# Patient Record
Sex: Male | Born: 1935 | Race: White | Hispanic: No | Smoking: Former smoker
Health system: Southern US, Community
[De-identification: ages and names within clinical notes are randomized; demographics above are authoritative.]

## PROBLEM LIST (undated history)

## (undated) DIAGNOSIS — C61 Malignant neoplasm of prostate: Secondary | ICD-10-CM

## (undated) DIAGNOSIS — N186 End stage renal disease: Secondary | ICD-10-CM

## (undated) DIAGNOSIS — C4490 Unspecified malignant neoplasm of skin, unspecified: Secondary | ICD-10-CM

## (undated) DIAGNOSIS — I272 Pulmonary hypertension, unspecified: Secondary | ICD-10-CM

## (undated) DIAGNOSIS — N2581 Secondary hyperparathyroidism of renal origin: Secondary | ICD-10-CM

## (undated) DIAGNOSIS — I428 Other cardiomyopathies: Secondary | ICD-10-CM

## (undated) DIAGNOSIS — J189 Pneumonia, unspecified organism: Secondary | ICD-10-CM

## (undated) DIAGNOSIS — R0600 Dyspnea, unspecified: Secondary | ICD-10-CM

## (undated) DIAGNOSIS — D126 Benign neoplasm of colon, unspecified: Secondary | ICD-10-CM

## (undated) DIAGNOSIS — I10 Essential (primary) hypertension: Secondary | ICD-10-CM

## (undated) DIAGNOSIS — I38 Endocarditis, valve unspecified: Secondary | ICD-10-CM

## (undated) DIAGNOSIS — C189 Malignant neoplasm of colon, unspecified: Secondary | ICD-10-CM

## (undated) DIAGNOSIS — M1990 Unspecified osteoarthritis, unspecified site: Secondary | ICD-10-CM

## (undated) DIAGNOSIS — Z9289 Personal history of other medical treatment: Secondary | ICD-10-CM

## (undated) DIAGNOSIS — Z950 Presence of cardiac pacemaker: Secondary | ICD-10-CM

## (undated) DIAGNOSIS — I2510 Atherosclerotic heart disease of native coronary artery without angina pectoris: Secondary | ICD-10-CM

## (undated) DIAGNOSIS — R42 Dizziness and giddiness: Secondary | ICD-10-CM

## (undated) DIAGNOSIS — I5022 Chronic systolic (congestive) heart failure: Secondary | ICD-10-CM

## (undated) DIAGNOSIS — I482 Chronic atrial fibrillation, unspecified: Secondary | ICD-10-CM

## (undated) DIAGNOSIS — G4730 Sleep apnea, unspecified: Secondary | ICD-10-CM

## (undated) DIAGNOSIS — E785 Hyperlipidemia, unspecified: Secondary | ICD-10-CM

## (undated) HISTORY — PX: PTCA: SHX146

## (undated) HISTORY — DX: Other cardiomyopathies: I42.8

## (undated) HISTORY — PX: PROSTATECTOMY: SHX69

## (undated) HISTORY — PX: FRACTURE SURGERY: SHX138

## (undated) HISTORY — DX: Sleep apnea, unspecified: G47.30

## (undated) HISTORY — PX: JOINT REPLACEMENT: SHX530

## (undated) HISTORY — DX: Hyperlipidemia, unspecified: E78.5

## (undated) HISTORY — PX: PENILE PROSTHESIS PLACEMENT: SHX739

## (undated) HISTORY — PX: COLECTOMY: SHX59

## (undated) HISTORY — DX: Unspecified osteoarthritis, unspecified site: M19.90

## (undated) HISTORY — DX: Essential (primary) hypertension: I10

## (undated) HISTORY — PX: EYE SURGERY: SHX253

## (undated) HISTORY — PX: INSERT / REPLACE / REMOVE PACEMAKER: SUR710

## (undated) HISTORY — PX: OTHER SURGICAL HISTORY: SHX169

## (undated) HISTORY — DX: Atherosclerotic heart disease of native coronary artery without angina pectoris: I25.10

## (undated) HISTORY — DX: Secondary hyperparathyroidism of renal origin: N25.81

## (undated) HISTORY — DX: Benign neoplasm of colon, unspecified: D12.6

---

## 2001-02-19 ENCOUNTER — Encounter: Payer: Self-pay | Admitting: Cardiology

## 2001-02-19 ENCOUNTER — Ambulatory Visit (HOSPITAL_COMMUNITY): Admission: RE | Admit: 2001-02-19 | Discharge: 2001-02-19 | Payer: Self-pay | Admitting: Cardiology

## 2002-07-09 ENCOUNTER — Encounter: Payer: Self-pay | Admitting: Emergency Medicine

## 2002-07-09 ENCOUNTER — Emergency Department (HOSPITAL_COMMUNITY): Admission: EM | Admit: 2002-07-09 | Discharge: 2002-07-10 | Payer: Self-pay | Admitting: Emergency Medicine

## 2002-11-14 ENCOUNTER — Emergency Department (HOSPITAL_COMMUNITY): Admission: EM | Admit: 2002-11-14 | Discharge: 2002-11-14 | Payer: Self-pay | Admitting: Emergency Medicine

## 2003-10-03 ENCOUNTER — Encounter (HOSPITAL_BASED_OUTPATIENT_CLINIC_OR_DEPARTMENT_OTHER): Admission: RE | Admit: 2003-10-03 | Discharge: 2003-10-10 | Payer: Self-pay | Admitting: Internal Medicine

## 2004-01-02 ENCOUNTER — Encounter (HOSPITAL_BASED_OUTPATIENT_CLINIC_OR_DEPARTMENT_OTHER): Admission: RE | Admit: 2004-01-02 | Discharge: 2004-01-16 | Payer: Self-pay | Admitting: Internal Medicine

## 2004-02-10 ENCOUNTER — Ambulatory Visit (HOSPITAL_BASED_OUTPATIENT_CLINIC_OR_DEPARTMENT_OTHER): Admission: RE | Admit: 2004-02-10 | Discharge: 2004-02-10 | Payer: Self-pay | Admitting: Internal Medicine

## 2004-03-10 ENCOUNTER — Emergency Department (HOSPITAL_COMMUNITY): Admission: EM | Admit: 2004-03-10 | Discharge: 2004-03-10 | Payer: Self-pay | Admitting: Emergency Medicine

## 2004-04-10 ENCOUNTER — Encounter (HOSPITAL_BASED_OUTPATIENT_CLINIC_OR_DEPARTMENT_OTHER): Admission: RE | Admit: 2004-04-10 | Discharge: 2004-04-26 | Payer: Self-pay | Admitting: Internal Medicine

## 2004-07-16 ENCOUNTER — Encounter (HOSPITAL_BASED_OUTPATIENT_CLINIC_OR_DEPARTMENT_OTHER): Admission: RE | Admit: 2004-07-16 | Discharge: 2004-07-29 | Payer: Self-pay | Admitting: Internal Medicine

## 2004-07-24 ENCOUNTER — Ambulatory Visit: Payer: Self-pay | Admitting: Internal Medicine

## 2004-08-22 ENCOUNTER — Ambulatory Visit: Payer: Self-pay | Admitting: Internal Medicine

## 2004-09-24 ENCOUNTER — Ambulatory Visit: Payer: Self-pay | Admitting: Internal Medicine

## 2004-09-26 ENCOUNTER — Ambulatory Visit: Payer: Self-pay | Admitting: Internal Medicine

## 2004-10-01 ENCOUNTER — Ambulatory Visit: Payer: Self-pay | Admitting: Pulmonary Disease

## 2004-10-22 ENCOUNTER — Encounter (HOSPITAL_BASED_OUTPATIENT_CLINIC_OR_DEPARTMENT_OTHER): Admission: RE | Admit: 2004-10-22 | Discharge: 2004-11-06 | Payer: Self-pay | Admitting: Internal Medicine

## 2004-10-28 ENCOUNTER — Ambulatory Visit: Payer: Self-pay | Admitting: Pulmonary Disease

## 2004-11-04 ENCOUNTER — Ambulatory Visit: Payer: Self-pay | Admitting: Internal Medicine

## 2004-11-11 ENCOUNTER — Ambulatory Visit: Payer: Self-pay | Admitting: Internal Medicine

## 2004-12-02 ENCOUNTER — Ambulatory Visit: Payer: Self-pay | Admitting: Internal Medicine

## 2004-12-31 ENCOUNTER — Ambulatory Visit: Payer: Self-pay | Admitting: Internal Medicine

## 2005-01-08 ENCOUNTER — Ambulatory Visit: Payer: Self-pay | Admitting: Pulmonary Disease

## 2005-01-30 ENCOUNTER — Ambulatory Visit: Payer: Self-pay | Admitting: Internal Medicine

## 2005-03-04 ENCOUNTER — Ambulatory Visit: Payer: Self-pay | Admitting: Internal Medicine

## 2005-03-06 ENCOUNTER — Emergency Department (HOSPITAL_COMMUNITY): Admission: EM | Admit: 2005-03-06 | Discharge: 2005-03-06 | Payer: Self-pay | Admitting: Emergency Medicine

## 2005-03-11 ENCOUNTER — Ambulatory Visit: Payer: Self-pay | Admitting: Internal Medicine

## 2005-03-19 ENCOUNTER — Ambulatory Visit: Payer: Self-pay

## 2005-04-08 ENCOUNTER — Ambulatory Visit: Payer: Self-pay | Admitting: Pulmonary Disease

## 2005-04-08 ENCOUNTER — Ambulatory Visit: Payer: Self-pay | Admitting: Internal Medicine

## 2005-05-09 ENCOUNTER — Ambulatory Visit: Payer: Self-pay | Admitting: Internal Medicine

## 2005-05-12 ENCOUNTER — Ambulatory Visit: Payer: Self-pay | Admitting: Internal Medicine

## 2005-05-13 ENCOUNTER — Ambulatory Visit: Payer: Self-pay | Admitting: Internal Medicine

## 2005-05-14 ENCOUNTER — Encounter: Admission: RE | Admit: 2005-05-14 | Discharge: 2005-05-14 | Payer: Self-pay | Admitting: Internal Medicine

## 2005-05-14 ENCOUNTER — Ambulatory Visit: Payer: Self-pay | Admitting: Internal Medicine

## 2005-05-16 ENCOUNTER — Ambulatory Visit: Payer: Self-pay | Admitting: Internal Medicine

## 2005-05-22 ENCOUNTER — Ambulatory Visit: Payer: Self-pay

## 2005-05-23 ENCOUNTER — Encounter: Admission: RE | Admit: 2005-05-23 | Discharge: 2005-05-23 | Payer: Self-pay | Admitting: Internal Medicine

## 2005-06-05 ENCOUNTER — Ambulatory Visit: Payer: Self-pay | Admitting: Internal Medicine

## 2005-06-10 ENCOUNTER — Ambulatory Visit: Payer: Self-pay | Admitting: Pulmonary Disease

## 2005-07-09 ENCOUNTER — Ambulatory Visit: Payer: Self-pay | Admitting: Internal Medicine

## 2005-07-10 ENCOUNTER — Ambulatory Visit: Payer: Self-pay | Admitting: Gastroenterology

## 2005-07-11 ENCOUNTER — Ambulatory Visit: Payer: Self-pay | Admitting: Internal Medicine

## 2005-07-21 ENCOUNTER — Encounter (INDEPENDENT_AMBULATORY_CARE_PROVIDER_SITE_OTHER): Payer: Self-pay | Admitting: *Deleted

## 2005-07-21 ENCOUNTER — Ambulatory Visit: Payer: Self-pay | Admitting: Gastroenterology

## 2005-07-21 LAB — HM COLONOSCOPY

## 2005-08-07 ENCOUNTER — Ambulatory Visit: Payer: Self-pay | Admitting: Internal Medicine

## 2005-08-26 ENCOUNTER — Ambulatory Visit: Payer: Self-pay | Admitting: Internal Medicine

## 2005-09-08 ENCOUNTER — Ambulatory Visit: Payer: Self-pay | Admitting: Internal Medicine

## 2005-09-25 ENCOUNTER — Encounter: Admission: RE | Admit: 2005-09-25 | Discharge: 2005-09-25 | Payer: Self-pay | Admitting: Internal Medicine

## 2005-10-13 ENCOUNTER — Ambulatory Visit: Payer: Self-pay | Admitting: Internal Medicine

## 2005-11-07 ENCOUNTER — Ambulatory Visit: Payer: Self-pay | Admitting: Internal Medicine

## 2005-11-12 ENCOUNTER — Ambulatory Visit: Payer: Self-pay | Admitting: Internal Medicine

## 2005-12-05 ENCOUNTER — Ambulatory Visit: Payer: Self-pay | Admitting: Internal Medicine

## 2006-01-06 ENCOUNTER — Ambulatory Visit: Payer: Self-pay | Admitting: Internal Medicine

## 2006-02-10 ENCOUNTER — Ambulatory Visit: Payer: Self-pay | Admitting: Internal Medicine

## 2006-03-12 ENCOUNTER — Ambulatory Visit: Payer: Self-pay | Admitting: Internal Medicine

## 2006-03-24 ENCOUNTER — Ambulatory Visit: Payer: Self-pay | Admitting: Internal Medicine

## 2006-03-26 ENCOUNTER — Ambulatory Visit: Payer: Self-pay | Admitting: Internal Medicine

## 2006-04-02 ENCOUNTER — Ambulatory Visit: Payer: Self-pay | Admitting: Internal Medicine

## 2006-04-09 ENCOUNTER — Encounter: Payer: Self-pay | Admitting: Internal Medicine

## 2006-04-09 ENCOUNTER — Ambulatory Visit: Payer: Self-pay

## 2006-04-13 ENCOUNTER — Ambulatory Visit: Payer: Self-pay | Admitting: Internal Medicine

## 2006-05-13 ENCOUNTER — Ambulatory Visit: Payer: Self-pay | Admitting: Internal Medicine

## 2006-05-21 ENCOUNTER — Ambulatory Visit: Payer: Self-pay | Admitting: Internal Medicine

## 2006-06-19 ENCOUNTER — Ambulatory Visit: Payer: Self-pay | Admitting: Internal Medicine

## 2006-07-06 ENCOUNTER — Ambulatory Visit: Payer: Self-pay | Admitting: Internal Medicine

## 2006-07-06 LAB — CONVERTED CEMR LAB
ALT: 22 units/L (ref 0–40)
AST: 22 units/L (ref 0–37)
Chol/HDL Ratio, serum: 5.6
Cholesterol: 204 mg/dL (ref 0–200)
Creatinine, Ser: 2 mg/dL — ABNORMAL HIGH (ref 0.4–1.5)
HDL: 36.3 mg/dL — ABNORMAL LOW (ref >39.0)
Hgb A1c MFr Bld: 7.9 % — ABNORMAL HIGH (ref 4.6–6.0)
LDL DIRECT: 123.1 mg/dL
Potassium: 4.6 meq/L (ref 3.5–5.1)
Sodium: 144 meq/L (ref 135–145)
Triglyceride fasting, serum: 169 mg/dL — ABNORMAL HIGH (ref 0–149)
VLDL: 34 mg/dL (ref 0–40)

## 2006-07-13 ENCOUNTER — Ambulatory Visit: Payer: Self-pay | Admitting: Internal Medicine

## 2006-07-13 DIAGNOSIS — E785 Hyperlipidemia, unspecified: Secondary | ICD-10-CM | POA: Insufficient documentation

## 2006-07-13 DIAGNOSIS — Z8546 Personal history of malignant neoplasm of prostate: Secondary | ICD-10-CM | POA: Insufficient documentation

## 2006-07-13 DIAGNOSIS — I2510 Atherosclerotic heart disease of native coronary artery without angina pectoris: Secondary | ICD-10-CM

## 2006-07-13 DIAGNOSIS — I10 Essential (primary) hypertension: Secondary | ICD-10-CM | POA: Insufficient documentation

## 2006-07-13 DIAGNOSIS — Z85038 Personal history of other malignant neoplasm of large intestine: Secondary | ICD-10-CM | POA: Insufficient documentation

## 2006-07-13 DIAGNOSIS — E1165 Type 2 diabetes mellitus with hyperglycemia: Secondary | ICD-10-CM

## 2006-07-13 DIAGNOSIS — I5022 Chronic systolic (congestive) heart failure: Secondary | ICD-10-CM

## 2006-07-13 DIAGNOSIS — I482 Chronic atrial fibrillation, unspecified: Secondary | ICD-10-CM

## 2006-08-06 ENCOUNTER — Ambulatory Visit: Payer: Self-pay | Admitting: Internal Medicine

## 2006-08-17 ENCOUNTER — Ambulatory Visit: Payer: Self-pay | Admitting: Internal Medicine

## 2006-08-17 LAB — CONVERTED CEMR LAB
ALT: 21 units/L (ref 0–40)
AST: 19 units/L (ref 0–37)
Albumin: 3.8 g/dL (ref 3.5–5.2)
Alkaline Phosphatase: 45 units/L (ref 39–117)
Bilirubin, Direct: 0.1 mg/dL (ref 0.0–0.3)
Chol/HDL Ratio, serum: 3.6
Cholesterol: 149 mg/dL (ref 0–200)
HDL: 41.4 mg/dL (ref >39.0)
LDL Cholesterol: 80 mg/dL (ref 0–99)
Total Bilirubin: 0.8 mg/dL (ref 0.3–1.2)
Total Protein: 6.7 g/dL (ref 6.0–8.3)
Triglyceride fasting, serum: 136 mg/dL (ref 0–149)
VLDL: 27 mg/dL (ref 0–40)

## 2006-08-24 ENCOUNTER — Ambulatory Visit: Payer: Self-pay | Admitting: Internal Medicine

## 2006-08-24 ENCOUNTER — Encounter: Payer: Self-pay | Admitting: Internal Medicine

## 2006-08-24 LAB — CONVERTED CEMR LAB
Cholesterol, target level: 200 mg/dL
HDL goal, serum: 40 mg/dL
LDL Goal: 70 mg/dL

## 2006-09-07 ENCOUNTER — Ambulatory Visit: Payer: Self-pay | Admitting: Internal Medicine

## 2006-09-22 HISTORY — PX: TOTAL KNEE ARTHROPLASTY: SHX125

## 2006-10-29 ENCOUNTER — Ambulatory Visit: Payer: Self-pay | Admitting: Internal Medicine

## 2006-11-27 ENCOUNTER — Ambulatory Visit: Payer: Self-pay | Admitting: Internal Medicine

## 2006-12-28 ENCOUNTER — Ambulatory Visit: Payer: Self-pay | Admitting: Internal Medicine

## 2006-12-28 LAB — CONVERTED CEMR LAB
ALT: 22 units/L (ref 0–40)
AST: 24 units/L (ref 0–37)
Albumin: 3.6 g/dL (ref 3.5–5.2)
Alkaline Phosphatase: 50 units/L (ref 39–117)
BUN: 33 mg/dL — ABNORMAL HIGH (ref 6–23)
Bilirubin, Direct: 0.1 mg/dL (ref 0.0–0.3)
CO2: 26 meq/L (ref 19–32)
Calcium: 9.5 mg/dL (ref 8.4–10.5)
Chloride: 106 meq/L (ref 96–112)
Cholesterol: 128 mg/dL (ref 0–200)
Creatinine, Ser: 1.9 mg/dL — ABNORMAL HIGH (ref 0.4–1.5)
GFR calc Af Amer: 45 mL/min
GFR calc non Af Amer: 37 mL/min
Glucose, Bld: 295 mg/dL — ABNORMAL HIGH (ref 70–99)
HDL: 40.8 mg/dL (ref >39.0)
Hgb A1c MFr Bld: 8.7 % — ABNORMAL HIGH (ref 4.6–6.0)
LDL Cholesterol: 61 mg/dL (ref 0–99)
Potassium: 3.6 meq/L (ref 3.5–5.1)
Sodium: 141 meq/L (ref 135–145)
Total Bilirubin: 0.7 mg/dL (ref 0.3–1.2)
Total CHOL/HDL Ratio: 3.1
Total Protein: 6.6 g/dL (ref 6.0–8.3)
Triglycerides: 131 mg/dL (ref 0–149)
VLDL: 26 mg/dL (ref 0–40)

## 2007-01-04 ENCOUNTER — Encounter: Payer: Self-pay | Admitting: Internal Medicine

## 2007-01-04 ENCOUNTER — Ambulatory Visit: Payer: Self-pay | Admitting: Internal Medicine

## 2007-01-28 ENCOUNTER — Ambulatory Visit: Payer: Self-pay | Admitting: Internal Medicine

## 2007-02-26 ENCOUNTER — Ambulatory Visit: Payer: Self-pay | Admitting: Internal Medicine

## 2007-03-25 ENCOUNTER — Ambulatory Visit: Payer: Self-pay | Admitting: Internal Medicine

## 2007-04-08 ENCOUNTER — Ambulatory Visit: Payer: Self-pay | Admitting: Internal Medicine

## 2007-04-20 ENCOUNTER — Telehealth: Payer: Self-pay | Admitting: Internal Medicine

## 2007-04-26 ENCOUNTER — Ambulatory Visit: Payer: Self-pay | Admitting: Internal Medicine

## 2007-04-26 LAB — CONVERTED CEMR LAB
ALT: 22 units/L (ref 0–53)
AST: 21 units/L (ref 0–37)
Albumin: 3.8 g/dL (ref 3.5–5.2)
Alkaline Phosphatase: 42 units/L (ref 39–117)
BUN: 30 mg/dL — ABNORMAL HIGH (ref 6–23)
Bilirubin, Direct: 0.1 mg/dL (ref 0.0–0.3)
CO2: 28 meq/L (ref 19–32)
Calcium: 8.7 mg/dL (ref 8.4–10.5)
Chloride: 109 meq/L (ref 96–112)
Cholesterol: 129 mg/dL (ref 0–200)
Creatinine, Ser: 1.9 mg/dL — ABNORMAL HIGH (ref 0.4–1.5)
GFR calc Af Amer: 45 mL/min
GFR calc non Af Amer: 37 mL/min
Glucose, Bld: 181 mg/dL — ABNORMAL HIGH (ref 70–99)
HDL: 42.9 mg/dL (ref >39.0)
Hgb A1c MFr Bld: 8.1 % — ABNORMAL HIGH (ref 4.6–6.0)
LDL Cholesterol: 72 mg/dL (ref 0–99)
Potassium: 3.7 meq/L (ref 3.5–5.1)
Sodium: 145 meq/L (ref 135–145)
Total Bilirubin: 0.8 mg/dL (ref 0.3–1.2)
Total CHOL/HDL Ratio: 3
Total Protein: 6.5 g/dL (ref 6.0–8.3)
Triglycerides: 73 mg/dL (ref 0–149)
VLDL: 15 mg/dL (ref 0–40)

## 2007-05-05 ENCOUNTER — Ambulatory Visit: Payer: Self-pay | Admitting: Internal Medicine

## 2007-05-19 ENCOUNTER — Telehealth: Payer: Self-pay | Admitting: Internal Medicine

## 2007-05-27 ENCOUNTER — Ambulatory Visit: Payer: Self-pay | Admitting: Internal Medicine

## 2007-05-27 LAB — CONVERTED CEMR LAB
INR: 2.1
Prothrombin Time: 17.7 s

## 2007-06-21 ENCOUNTER — Ambulatory Visit: Payer: Self-pay | Admitting: Pulmonary Disease

## 2007-06-30 ENCOUNTER — Ambulatory Visit: Payer: Self-pay | Admitting: Internal Medicine

## 2007-06-30 LAB — CONVERTED CEMR LAB
INR: 3.3
Prothrombin Time: 21.8 s

## 2007-07-19 ENCOUNTER — Telehealth: Payer: Self-pay | Admitting: Internal Medicine

## 2007-07-28 ENCOUNTER — Ambulatory Visit: Payer: Self-pay | Admitting: Internal Medicine

## 2007-07-28 LAB — CONVERTED CEMR LAB
INR: 2.2
Prothrombin Time: 18 s

## 2007-08-16 ENCOUNTER — Telehealth (INDEPENDENT_AMBULATORY_CARE_PROVIDER_SITE_OTHER): Payer: Self-pay | Admitting: *Deleted

## 2007-08-25 ENCOUNTER — Ambulatory Visit: Payer: Self-pay | Admitting: Internal Medicine

## 2007-08-25 LAB — CONVERTED CEMR LAB
ALT: 28 units/L (ref 0–53)
AST: 24 units/L (ref 0–37)
Albumin: 3.8 g/dL (ref 3.5–5.2)
Alkaline Phosphatase: 45 units/L (ref 39–117)
BUN: 31 mg/dL — ABNORMAL HIGH (ref 6–23)
Bilirubin Urine: NEGATIVE
Bilirubin, Direct: 0.3 mg/dL (ref 0.0–0.3)
CO2: 27 meq/L (ref 19–32)
Calcium: 9.3 mg/dL (ref 8.4–10.5)
Chloride: 103 meq/L (ref 96–112)
Cholesterol: 147 mg/dL (ref 0–200)
Creatinine, Ser: 2 mg/dL — ABNORMAL HIGH (ref 0.4–1.5)
GFR calc Af Amer: 42 mL/min
GFR calc non Af Amer: 35 mL/min
Glucose, Bld: 162 mg/dL — ABNORMAL HIGH (ref 70–99)
Glucose, Urine, Semiquant: 100
HDL: 42.9 mg/dL (ref >39.0)
Hgb A1c MFr Bld: 7.2 % — ABNORMAL HIGH (ref 4.6–6.0)
INR: 5
Ketones, urine, test strip: NEGATIVE
LDL Cholesterol: 79 mg/dL (ref 0–99)
Nitrite: NEGATIVE
Potassium: 4.2 meq/L (ref 3.5–5.1)
Prothrombin Time: 27.3 s
Sodium: 142 meq/L (ref 135–145)
Specific Gravity, Urine: 1.02
Total Bilirubin: 0.9 mg/dL (ref 0.3–1.2)
Total CHOL/HDL Ratio: 3.4
Total Protein: 6.3 g/dL (ref 6.0–8.3)
Triglycerides: 128 mg/dL (ref 0–149)
Urobilinogen, UA: 0.2
VLDL: 26 mg/dL (ref 0–40)
WBC Urine, dipstick: NEGATIVE
pH: 5.5

## 2007-09-01 ENCOUNTER — Ambulatory Visit: Payer: Self-pay | Admitting: Internal Medicine

## 2007-09-01 LAB — CONVERTED CEMR LAB
INR: 1.8
Prothrombin Time: 16.5 s

## 2007-09-14 ENCOUNTER — Ambulatory Visit: Payer: Self-pay | Admitting: Internal Medicine

## 2007-09-14 LAB — CONVERTED CEMR LAB
INR: 3.2
Prothrombin Time: 21.5 s

## 2007-12-27 ENCOUNTER — Telehealth: Payer: Self-pay | Admitting: Internal Medicine

## 2007-12-28 ENCOUNTER — Ambulatory Visit: Payer: Self-pay | Admitting: Internal Medicine

## 2007-12-28 LAB — CONVERTED CEMR LAB
INR: 2.1
Prothrombin Time: 17.9 s

## 2008-01-18 ENCOUNTER — Ambulatory Visit: Payer: Self-pay | Admitting: Internal Medicine

## 2008-01-18 LAB — CONVERTED CEMR LAB
ALT: 27 units/L (ref 0–53)
AST: 24 units/L (ref 0–37)
Albumin: 3.8 g/dL (ref 3.5–5.2)
Alkaline Phosphatase: 46 units/L (ref 39–117)
BUN: 29 mg/dL — ABNORMAL HIGH (ref 6–23)
Bilirubin, Direct: 0.2 mg/dL (ref 0.0–0.3)
Calcium: 9 mg/dL (ref 8.4–10.5)
Cholesterol: 137 mg/dL (ref 0–200)
GFR calc Af Amer: 45 mL/min
Hgb A1c MFr Bld: 7.5 % — ABNORMAL HIGH (ref 4.6–6.0)
Total Bilirubin: 1.2 mg/dL (ref 0.3–1.2)
Total Protein: 6.7 g/dL (ref 6.0–8.3)

## 2008-01-27 ENCOUNTER — Ambulatory Visit: Payer: Self-pay | Admitting: Internal Medicine

## 2008-02-24 ENCOUNTER — Ambulatory Visit: Payer: Self-pay | Admitting: Internal Medicine

## 2008-02-24 LAB — CONVERTED CEMR LAB: Prothrombin Time: 19.7 s

## 2008-03-23 ENCOUNTER — Ambulatory Visit: Payer: Self-pay | Admitting: Internal Medicine

## 2008-03-23 LAB — CONVERTED CEMR LAB: INR: 2.7

## 2008-04-10 ENCOUNTER — Ambulatory Visit: Payer: Self-pay | Admitting: Internal Medicine

## 2008-04-11 ENCOUNTER — Inpatient Hospital Stay (HOSPITAL_COMMUNITY): Admission: EM | Admit: 2008-04-11 | Discharge: 2008-04-14 | Payer: Self-pay | Admitting: Emergency Medicine

## 2008-04-12 ENCOUNTER — Encounter: Payer: Self-pay | Admitting: Cardiology

## 2008-04-18 ENCOUNTER — Ambulatory Visit: Payer: Self-pay | Admitting: Cardiology

## 2008-04-18 LAB — CONVERTED CEMR LAB: Chloride: 107 meq/L (ref 96–112)

## 2008-04-25 ENCOUNTER — Ambulatory Visit: Payer: Self-pay | Admitting: Cardiology

## 2008-04-25 ENCOUNTER — Inpatient Hospital Stay (HOSPITAL_COMMUNITY): Admission: AD | Admit: 2008-04-25 | Discharge: 2008-05-03 | Payer: Self-pay | Admitting: Cardiology

## 2008-05-08 ENCOUNTER — Telehealth: Payer: Self-pay | Admitting: Internal Medicine

## 2008-05-09 ENCOUNTER — Ambulatory Visit: Payer: Self-pay | Admitting: Internal Medicine

## 2008-05-09 LAB — CONVERTED CEMR LAB

## 2008-05-12 LAB — CONVERTED CEMR LAB
BUN: 31 mg/dL — ABNORMAL HIGH (ref 6–23)
CO2: 25 meq/L (ref 19–32)
Calcium: 8.9 mg/dL (ref 8.4–10.5)
GFR calc Af Amer: 42 mL/min
Glucose, Bld: 155 mg/dL — ABNORMAL HIGH (ref 70–99)
Hgb A1c MFr Bld: 7.2 % — ABNORMAL HIGH (ref 4.6–6.0)

## 2008-05-17 ENCOUNTER — Ambulatory Visit: Payer: Self-pay | Admitting: Cardiology

## 2008-05-19 ENCOUNTER — Ambulatory Visit: Payer: Self-pay | Admitting: Cardiology

## 2008-05-19 ENCOUNTER — Ambulatory Visit: Payer: Self-pay | Admitting: Internal Medicine

## 2008-05-23 ENCOUNTER — Ambulatory Visit: Payer: Self-pay | Admitting: Cardiology

## 2008-05-24 ENCOUNTER — Ambulatory Visit: Payer: Self-pay | Admitting: Cardiology

## 2008-05-24 LAB — CONVERTED CEMR LAB
BUN: 29 mg/dL — ABNORMAL HIGH (ref 6–23)
CO2: 29 meq/L (ref 19–32)
Chloride: 108 meq/L (ref 96–112)
Chloride: 111 meq/L (ref 96–112)
Glucose, Bld: 250 mg/dL — ABNORMAL HIGH (ref 70–99)
Glucose, Bld: 121 mg/dL — ABNORMAL HIGH (ref 70–99)
Potassium: 2.6 meq/L — CL (ref 3.5–5.1)
Potassium: 3.2 meq/L — ABNORMAL LOW (ref 3.5–5.1)
Sodium: 141 meq/L (ref 135–145)
Sodium: 143 meq/L (ref 135–145)

## 2008-05-25 ENCOUNTER — Encounter (HOSPITAL_COMMUNITY): Admission: RE | Admit: 2008-05-25 | Discharge: 2008-08-23 | Payer: Self-pay | Admitting: Cardiology

## 2008-05-30 ENCOUNTER — Ambulatory Visit: Payer: Self-pay | Admitting: Internal Medicine

## 2008-05-30 LAB — CONVERTED CEMR LAB
BUN: 30 mg/dL — ABNORMAL HIGH (ref 6–23)
CO2: 28 meq/L (ref 19–32)
Chloride: 111 meq/L (ref 96–112)
Creatinine, Ser: 2 mg/dL — ABNORMAL HIGH (ref 0.4–1.5)
Potassium: 4 meq/L (ref 3.5–5.1)

## 2008-06-13 ENCOUNTER — Ambulatory Visit: Payer: Self-pay | Admitting: Internal Medicine

## 2008-06-13 LAB — CONVERTED CEMR LAB: Prothrombin Time: 18.7 s

## 2008-06-14 LAB — CONVERTED CEMR LAB
BUN: 29 mg/dL — ABNORMAL HIGH (ref 6–23)
Chloride: 111 meq/L (ref 96–112)
GFR calc Af Amer: 42 mL/min
GFR calc non Af Amer: 35 mL/min
Glucose, Bld: 205 mg/dL — ABNORMAL HIGH (ref 70–99)
Potassium: 4 meq/L (ref 3.5–5.1)
Sodium: 143 meq/L (ref 135–145)

## 2008-06-19 ENCOUNTER — Ambulatory Visit: Payer: Self-pay | Admitting: Internal Medicine

## 2008-06-19 LAB — CONVERTED CEMR LAB
CO2: 28 meq/L (ref 19–32)
Calcium: 9.2 mg/dL (ref 8.4–10.5)
Creatinine, Ser: 2.2 mg/dL — ABNORMAL HIGH (ref 0.4–1.5)
GFR calc non Af Amer: 31 mL/min
Pro B Natriuretic peptide (BNP): 941 pg/mL — ABNORMAL HIGH (ref 0.0–100.0)
Sodium: 143 meq/L (ref 135–145)

## 2008-06-29 ENCOUNTER — Ambulatory Visit: Payer: Self-pay | Admitting: Cardiology

## 2008-07-04 ENCOUNTER — Ambulatory Visit: Payer: Self-pay | Admitting: Internal Medicine

## 2008-07-11 ENCOUNTER — Ambulatory Visit: Payer: Self-pay | Admitting: Internal Medicine

## 2008-07-11 LAB — CONVERTED CEMR LAB: Prothrombin Time: 19 s

## 2008-07-31 ENCOUNTER — Ambulatory Visit: Payer: Self-pay | Admitting: Gastroenterology

## 2008-08-04 ENCOUNTER — Telehealth: Payer: Self-pay | Admitting: Gastroenterology

## 2008-08-08 ENCOUNTER — Ambulatory Visit: Payer: Self-pay | Admitting: Internal Medicine

## 2008-08-10 LAB — CONVERTED CEMR LAB
ALT: 31 units/L (ref 0–53)
AST: 22 units/L (ref 0–37)
Alkaline Phosphatase: 39 units/L (ref 39–117)
Bilirubin, Direct: 0.1 mg/dL (ref 0.0–0.3)
CO2: 23 meq/L (ref 19–32)
Chloride: 114 meq/L — ABNORMAL HIGH (ref 96–112)
Creatinine, Ser: 3.5 mg/dL — ABNORMAL HIGH (ref 0.4–1.5)
GFR calc non Af Amer: 18 mL/min
Hgb A1c MFr Bld: 7.1 % — ABNORMAL HIGH (ref 4.6–6.0)
LDL Cholesterol: 63 mg/dL (ref 0–99)
Potassium: 5.7 meq/L — ABNORMAL HIGH (ref 3.5–5.1)
Sodium: 143 meq/L (ref 135–145)
Total Bilirubin: 0.8 mg/dL (ref 0.3–1.2)
Total CHOL/HDL Ratio: 2.9
Triglycerides: 117 mg/dL (ref 0–149)

## 2008-08-14 ENCOUNTER — Encounter: Payer: Self-pay | Admitting: Gastroenterology

## 2008-08-14 ENCOUNTER — Ambulatory Visit: Payer: Self-pay | Admitting: Gastroenterology

## 2008-08-15 ENCOUNTER — Encounter: Payer: Self-pay | Admitting: Gastroenterology

## 2008-08-15 ENCOUNTER — Ambulatory Visit: Payer: Self-pay | Admitting: Internal Medicine

## 2008-08-22 LAB — CONVERTED CEMR LAB
BUN: 50 mg/dL — ABNORMAL HIGH (ref 6–23)
CO2: 22 meq/L (ref 19–32)
Calcium: 9.1 mg/dL (ref 8.4–10.5)
Chloride: 114 meq/L — ABNORMAL HIGH (ref 96–112)
Creatinine, Ser: 2.6 mg/dL — ABNORMAL HIGH (ref 0.4–1.5)
Glucose, Bld: 163 mg/dL — ABNORMAL HIGH (ref 70–99)

## 2008-08-24 ENCOUNTER — Encounter (HOSPITAL_COMMUNITY): Admission: RE | Admit: 2008-08-24 | Discharge: 2008-09-20 | Payer: Self-pay | Admitting: Cardiology

## 2008-09-12 ENCOUNTER — Ambulatory Visit: Payer: Self-pay | Admitting: Internal Medicine

## 2008-09-12 LAB — CONVERTED CEMR LAB: Prothrombin Time: 19.7 s

## 2008-09-13 ENCOUNTER — Ambulatory Visit: Payer: Self-pay | Admitting: Internal Medicine

## 2008-09-22 HISTORY — PX: BI-VENTRICULAR IMPLANTABLE CARDIOVERTER DEFIBRILLATOR  (CRT-D): SHX5747

## 2008-09-22 HISTORY — PX: PACEMAKER INSERTION: SHX728

## 2008-09-28 ENCOUNTER — Ambulatory Visit: Payer: Self-pay | Admitting: Internal Medicine

## 2008-10-10 ENCOUNTER — Ambulatory Visit: Payer: Self-pay | Admitting: Internal Medicine

## 2008-10-10 LAB — CONVERTED CEMR LAB
BUN: 62 mg/dL — ABNORMAL HIGH (ref 6–23)
CO2: 20 meq/L (ref 19–32)
Chloride: 115 meq/L — ABNORMAL HIGH (ref 96–112)
Creatinine, Ser: 2.8 mg/dL — ABNORMAL HIGH (ref 0.4–1.5)
GFR calc non Af Amer: 24 mL/min
Potassium: 5.5 meq/L — ABNORMAL HIGH (ref 3.5–5.1)

## 2008-10-13 ENCOUNTER — Ambulatory Visit: Payer: Self-pay | Admitting: Internal Medicine

## 2008-10-13 LAB — CONVERTED CEMR LAB
CO2: 23 meq/L (ref 19–32)
Chloride: 108 meq/L (ref 96–112)
Creatinine, Ser: 2.9 mg/dL — ABNORMAL HIGH (ref 0.4–1.5)
Glucose, Bld: 265 mg/dL — ABNORMAL HIGH (ref 70–99)
Sodium: 139 meq/L (ref 135–145)

## 2008-10-24 ENCOUNTER — Ambulatory Visit: Payer: Self-pay | Admitting: Internal Medicine

## 2008-10-27 LAB — CONVERTED CEMR LAB
BUN: 46 mg/dL — ABNORMAL HIGH (ref 6–23)
CO2: 25 meq/L (ref 19–32)
Glucose, Bld: 271 mg/dL — ABNORMAL HIGH (ref 70–99)
Potassium: 4.7 meq/L (ref 3.5–5.1)
Sodium: 142 meq/L (ref 135–145)

## 2008-11-01 ENCOUNTER — Ambulatory Visit: Payer: Self-pay | Admitting: Internal Medicine

## 2008-11-01 DIAGNOSIS — G4730 Sleep apnea, unspecified: Secondary | ICD-10-CM

## 2008-11-07 ENCOUNTER — Ambulatory Visit: Payer: Self-pay | Admitting: Internal Medicine

## 2008-11-07 LAB — CONVERTED CEMR LAB
INR: 2.3
Prothrombin Time: 18.5 s

## 2008-11-08 ENCOUNTER — Encounter: Payer: Self-pay | Admitting: Internal Medicine

## 2008-11-08 LAB — CONVERTED CEMR LAB
Calcium: 9.2 mg/dL (ref 8.4–10.5)
GFR calc Af Amer: 24 mL/min
Glucose, Bld: 85 mg/dL (ref 70–99)
Sodium: 142 meq/L (ref 135–145)

## 2008-11-13 ENCOUNTER — Telehealth: Payer: Self-pay | Admitting: Internal Medicine

## 2008-11-16 ENCOUNTER — Ambulatory Visit: Payer: Self-pay | Admitting: Internal Medicine

## 2008-11-17 ENCOUNTER — Encounter: Payer: Self-pay | Admitting: *Deleted

## 2008-11-17 ENCOUNTER — Ambulatory Visit (HOSPITAL_COMMUNITY): Admission: RE | Admit: 2008-11-17 | Discharge: 2008-11-17 | Payer: Self-pay | Admitting: Internal Medicine

## 2008-11-23 ENCOUNTER — Ambulatory Visit: Payer: Self-pay | Admitting: Cardiology

## 2008-11-29 ENCOUNTER — Telehealth (INDEPENDENT_AMBULATORY_CARE_PROVIDER_SITE_OTHER): Payer: Self-pay | Admitting: *Deleted

## 2008-11-30 ENCOUNTER — Encounter: Payer: Self-pay | Admitting: Cardiology

## 2008-11-30 ENCOUNTER — Ambulatory Visit: Payer: Self-pay | Admitting: Cardiology

## 2008-11-30 ENCOUNTER — Ambulatory Visit: Payer: Self-pay

## 2008-12-04 ENCOUNTER — Ambulatory Visit: Payer: Self-pay | Admitting: Internal Medicine

## 2008-12-04 LAB — HM DIABETES FOOT EXAM: HM Diabetic Foot Exam: NORMAL

## 2008-12-14 ENCOUNTER — Ambulatory Visit: Payer: Self-pay | Admitting: Internal Medicine

## 2008-12-14 LAB — CONVERTED CEMR LAB: Prothrombin Time: 17.3 s

## 2008-12-15 LAB — CONVERTED CEMR LAB
ALT: 17 units/L (ref 0–53)
Albumin: 4 g/dL (ref 3.5–5.2)
BUN: 37 mg/dL — ABNORMAL HIGH (ref 6–23)
CO2: 31 meq/L (ref 19–32)
Chloride: 104 meq/L (ref 96–112)
Cholesterol: 196 mg/dL (ref 0–200)
Creatinine, Ser: 2.3 mg/dL — ABNORMAL HIGH (ref 0.4–1.5)
Total Protein: 6.8 g/dL (ref 6.0–8.3)
Triglycerides: 153 mg/dL — ABNORMAL HIGH (ref 0.0–149.0)

## 2009-01-02 ENCOUNTER — Ambulatory Visit: Payer: Self-pay | Admitting: Internal Medicine

## 2009-01-11 ENCOUNTER — Inpatient Hospital Stay (HOSPITAL_COMMUNITY): Admission: RE | Admit: 2009-01-11 | Discharge: 2009-01-15 | Payer: Self-pay | Admitting: Orthopedic Surgery

## 2009-01-11 ENCOUNTER — Ambulatory Visit: Payer: Self-pay | Admitting: Internal Medicine

## 2009-01-11 ENCOUNTER — Encounter: Payer: Self-pay | Admitting: Internal Medicine

## 2009-01-17 ENCOUNTER — Ambulatory Visit: Payer: Self-pay | Admitting: Internal Medicine

## 2009-01-19 ENCOUNTER — Encounter: Payer: Self-pay | Admitting: Internal Medicine

## 2009-01-23 ENCOUNTER — Telehealth: Payer: Self-pay | Admitting: Internal Medicine

## 2009-02-05 ENCOUNTER — Ambulatory Visit: Payer: Self-pay | Admitting: Internal Medicine

## 2009-02-05 LAB — CONVERTED CEMR LAB
INR: 1.9
Prothrombin Time: 16.8 s

## 2009-03-02 ENCOUNTER — Ambulatory Visit: Payer: Self-pay | Admitting: Internal Medicine

## 2009-03-02 LAB — CONVERTED CEMR LAB: INR: 1.3

## 2009-03-06 ENCOUNTER — Ambulatory Visit: Payer: Self-pay | Admitting: Internal Medicine

## 2009-03-07 LAB — CONVERTED CEMR LAB
ALT: 16 units/L (ref 0–53)
AST: 18 units/L (ref 0–37)
Alkaline Phosphatase: 40 units/L (ref 39–117)
Bilirubin, Direct: 0.2 mg/dL (ref 0.0–0.3)
Calcium: 8.9 mg/dL (ref 8.4–10.5)
Cholesterol: 157 mg/dL (ref 0–200)
GFR calc non Af Amer: 31.08 mL/min (ref >60)
LDL Cholesterol: 89 mg/dL (ref 0–99)
Sodium: 144 meq/L (ref 135–145)
TSH: 1.05 microintl units/mL (ref 0.35–5.50)
Total Protein: 6.9 g/dL (ref 6.0–8.3)
Triglycerides: 143 mg/dL (ref 0.0–149.0)

## 2009-03-13 ENCOUNTER — Ambulatory Visit: Payer: Self-pay | Admitting: Internal Medicine

## 2009-03-13 LAB — CONVERTED CEMR LAB
INR: 1.8
Prothrombin Time: 16.4 s

## 2009-03-27 ENCOUNTER — Ambulatory Visit: Payer: Self-pay | Admitting: Internal Medicine

## 2009-03-27 LAB — CONVERTED CEMR LAB: INR: 2.3

## 2009-03-29 ENCOUNTER — Ambulatory Visit: Payer: Self-pay | Admitting: Internal Medicine

## 2009-04-04 ENCOUNTER — Inpatient Hospital Stay (HOSPITAL_COMMUNITY): Admission: EM | Admit: 2009-04-04 | Discharge: 2009-04-08 | Payer: Self-pay | Admitting: Emergency Medicine

## 2009-04-04 ENCOUNTER — Encounter: Payer: Self-pay | Admitting: Cardiology

## 2009-04-04 ENCOUNTER — Ambulatory Visit: Payer: Self-pay | Admitting: Cardiology

## 2009-04-05 ENCOUNTER — Encounter: Payer: Self-pay | Admitting: Cardiology

## 2009-04-12 ENCOUNTER — Telehealth: Payer: Self-pay | Admitting: Cardiology

## 2009-04-16 ENCOUNTER — Ambulatory Visit: Payer: Self-pay | Admitting: Cardiology

## 2009-04-18 DIAGNOSIS — Z85828 Personal history of other malignant neoplasm of skin: Secondary | ICD-10-CM

## 2009-04-18 DIAGNOSIS — M1990 Unspecified osteoarthritis, unspecified site: Secondary | ICD-10-CM

## 2009-04-19 LAB — CONVERTED CEMR LAB
CO2: 30 meq/L (ref 19–32)
Calcium: 9.2 mg/dL (ref 8.4–10.5)
Creatinine, Ser: 2 mg/dL — ABNORMAL HIGH (ref 0.4–1.5)
GFR calc non Af Amer: 34.68 mL/min (ref >60)
Glucose, Bld: 180 mg/dL — ABNORMAL HIGH (ref 70–99)

## 2009-04-26 ENCOUNTER — Ambulatory Visit: Payer: Self-pay | Admitting: Cardiology

## 2009-04-30 ENCOUNTER — Ambulatory Visit: Payer: Self-pay | Admitting: Internal Medicine

## 2009-04-30 LAB — CONVERTED CEMR LAB: INR: 1.8

## 2009-05-11 ENCOUNTER — Ambulatory Visit: Payer: Self-pay | Admitting: Internal Medicine

## 2009-05-14 ENCOUNTER — Ambulatory Visit: Payer: Self-pay | Admitting: Cardiovascular Disease

## 2009-05-14 ENCOUNTER — Telehealth: Payer: Self-pay | Admitting: Cardiology

## 2009-05-14 LAB — CONVERTED CEMR LAB
Albumin: 4 g/dL (ref 3.5–5.2)
CO2: 30 meq/L (ref 19–32)
Chloride: 111 meq/L (ref 96–112)
HDL: 41.7 mg/dL (ref >39.00)
LDL Cholesterol: 91 mg/dL (ref 0–99)
Potassium: 4.2 meq/L (ref 3.5–5.1)
Sodium: 146 meq/L — ABNORMAL HIGH (ref 135–145)
Total Bilirubin: 1 mg/dL (ref 0.3–1.2)
Total CHOL/HDL Ratio: 4
Triglycerides: 163 mg/dL — ABNORMAL HIGH (ref 0.0–149.0)

## 2009-05-16 LAB — CONVERTED CEMR LAB
BUN: 37 mg/dL — ABNORMAL HIGH (ref 6–23)
CO2: 31 meq/L (ref 19–32)
Chloride: 105 meq/L (ref 96–112)
Glucose, Bld: 169 mg/dL — ABNORMAL HIGH (ref 70–99)
Potassium: 3.5 meq/L (ref 3.5–5.1)
Pro B Natriuretic peptide (BNP): 2888 pg/mL — ABNORMAL HIGH (ref 0.0–100.0)

## 2009-05-21 ENCOUNTER — Ambulatory Visit: Payer: Self-pay | Admitting: Cardiology

## 2009-06-01 ENCOUNTER — Telehealth (INDEPENDENT_AMBULATORY_CARE_PROVIDER_SITE_OTHER): Payer: Self-pay | Admitting: *Deleted

## 2009-06-08 ENCOUNTER — Ambulatory Visit: Payer: Self-pay | Admitting: Internal Medicine

## 2009-06-08 LAB — CONVERTED CEMR LAB: INR: 2.9

## 2009-06-21 ENCOUNTER — Ambulatory Visit: Payer: Self-pay

## 2009-06-21 ENCOUNTER — Ambulatory Visit: Payer: Self-pay | Admitting: Cardiology

## 2009-06-21 ENCOUNTER — Encounter: Payer: Self-pay | Admitting: Cardiology

## 2009-06-25 ENCOUNTER — Ambulatory Visit: Payer: Self-pay | Admitting: Cardiology

## 2009-06-25 ENCOUNTER — Inpatient Hospital Stay (HOSPITAL_COMMUNITY): Admission: AD | Admit: 2009-06-25 | Discharge: 2009-06-28 | Payer: Self-pay | Admitting: Cardiology

## 2009-06-29 ENCOUNTER — Telehealth: Payer: Self-pay | Admitting: Cardiology

## 2009-07-02 ENCOUNTER — Ambulatory Visit: Payer: Self-pay | Admitting: Cardiology

## 2009-07-03 ENCOUNTER — Telehealth: Payer: Self-pay | Admitting: Internal Medicine

## 2009-07-04 LAB — CONVERTED CEMR LAB
BUN: 39 mg/dL — ABNORMAL HIGH
CO2: 29 meq/L
Calcium: 8.9 mg/dL
Chloride: 101 meq/L
Creatinine, Ser: 2.6 mg/dL — ABNORMAL HIGH
GFR calc non Af Amer: 25.61 mL/min
Glucose, Bld: 290 mg/dL — ABNORMAL HIGH
Potassium: 4 meq/L
Sodium: 140 meq/L

## 2009-07-06 ENCOUNTER — Ambulatory Visit: Payer: Self-pay | Admitting: Internal Medicine

## 2009-07-09 ENCOUNTER — Encounter: Payer: Self-pay | Admitting: Internal Medicine

## 2009-07-10 ENCOUNTER — Encounter (INDEPENDENT_AMBULATORY_CARE_PROVIDER_SITE_OTHER): Payer: Self-pay | Admitting: *Deleted

## 2009-07-10 ENCOUNTER — Ambulatory Visit: Payer: Self-pay | Admitting: Internal Medicine

## 2009-07-12 ENCOUNTER — Telehealth: Payer: Self-pay | Admitting: Internal Medicine

## 2009-07-16 ENCOUNTER — Ambulatory Visit: Payer: Self-pay | Admitting: Cardiovascular Disease

## 2009-07-16 ENCOUNTER — Ambulatory Visit: Payer: Self-pay | Admitting: Internal Medicine

## 2009-07-16 ENCOUNTER — Encounter: Payer: Self-pay | Admitting: Cardiology

## 2009-07-16 LAB — CONVERTED CEMR LAB: POC INR: 2

## 2009-07-18 ENCOUNTER — Telehealth (INDEPENDENT_AMBULATORY_CARE_PROVIDER_SITE_OTHER): Payer: Self-pay | Admitting: *Deleted

## 2009-07-19 ENCOUNTER — Ambulatory Visit: Payer: Self-pay | Admitting: Internal Medicine

## 2009-07-19 ENCOUNTER — Inpatient Hospital Stay (HOSPITAL_COMMUNITY): Admission: RE | Admit: 2009-07-19 | Discharge: 2009-07-20 | Payer: Self-pay | Admitting: Internal Medicine

## 2009-07-20 ENCOUNTER — Encounter: Payer: Self-pay | Admitting: Internal Medicine

## 2009-07-22 LAB — CONVERTED CEMR LAB
Basophils Relative: 0.5 % (ref 0.0–3.0)
Calcium: 8.5 mg/dL (ref 8.4–10.5)
Creatinine, Ser: 2.3 mg/dL — ABNORMAL HIGH (ref 0.4–1.5)
Eosinophils Absolute: 0.1 10*3/uL (ref 0.0–0.7)
Eosinophils Relative: 1.8 % (ref 0.0–5.0)
GFR calc non Af Amer: 29.5 mL/min (ref >60)
INR: 1.8 — ABNORMAL HIGH (ref 0.8–1.0)
Lymphocytes Relative: 23 % (ref 12.0–46.0)
Neutrophils Relative %: 68.5 % (ref 43.0–77.0)
Prothrombin Time: 19.1 s — ABNORMAL HIGH (ref 9.1–11.7)
RBC: 4.7 M/uL (ref 4.22–5.81)
WBC: 5.8 10*3/uL (ref 4.5–10.5)
aPTT: 34.6 s — ABNORMAL HIGH (ref 21.7–28.8)

## 2009-07-23 ENCOUNTER — Encounter (INDEPENDENT_AMBULATORY_CARE_PROVIDER_SITE_OTHER): Payer: Self-pay | Admitting: *Deleted

## 2009-07-25 ENCOUNTER — Encounter: Payer: Self-pay | Admitting: Internal Medicine

## 2009-07-25 ENCOUNTER — Ambulatory Visit: Payer: Self-pay | Admitting: Cardiology

## 2009-07-25 ENCOUNTER — Ambulatory Visit: Payer: Self-pay

## 2009-07-25 ENCOUNTER — Encounter: Payer: Self-pay | Admitting: Cardiology

## 2009-07-30 LAB — CONVERTED CEMR LAB
BUN: 39 mg/dL — ABNORMAL HIGH (ref 6–23)
Calcium: 8.7 mg/dL (ref 8.4–10.5)
GFR calc non Af Amer: 31.05 mL/min (ref >60)
Potassium: 3.8 meq/L (ref 3.5–5.1)
Sodium: 144 meq/L (ref 135–145)

## 2009-08-07 ENCOUNTER — Ambulatory Visit: Payer: Self-pay | Admitting: Cardiology

## 2009-08-07 DIAGNOSIS — I420 Dilated cardiomyopathy: Secondary | ICD-10-CM

## 2009-08-07 LAB — CONVERTED CEMR LAB: POC INR: 2

## 2009-08-13 ENCOUNTER — Telehealth: Payer: Self-pay | Admitting: Cardiology

## 2009-08-13 LAB — CONVERTED CEMR LAB
BUN: 37 mg/dL — ABNORMAL HIGH
CO2: 28 meq/L
Calcium: 8.9 mg/dL
Chloride: 106 meq/L
Creatinine, Ser: 2.2 mg/dL — ABNORMAL HIGH
Digitoxin Lvl: 0.3 ng/mL — ABNORMAL LOW
GFR calc non Af Amer: 31.05 mL/min
Glucose, Bld: 199 mg/dL — ABNORMAL HIGH
Potassium: 3.7 meq/L
Sodium: 144 meq/L

## 2009-08-14 ENCOUNTER — Encounter: Payer: Self-pay | Admitting: Internal Medicine

## 2009-08-15 ENCOUNTER — Ambulatory Visit: Payer: Self-pay | Admitting: Internal Medicine

## 2009-08-15 ENCOUNTER — Encounter: Payer: Self-pay | Admitting: Cardiology

## 2009-08-21 ENCOUNTER — Ambulatory Visit: Payer: Self-pay | Admitting: Internal Medicine

## 2009-08-28 ENCOUNTER — Ambulatory Visit: Payer: Self-pay | Admitting: Internal Medicine

## 2009-08-28 LAB — CONVERTED CEMR LAB
AST: 19 units/L (ref 0–37)
Albumin: 3.8 g/dL (ref 3.5–5.2)
Alkaline Phosphatase: 47 units/L (ref 39–117)
BUN: 30 mg/dL — ABNORMAL HIGH (ref 6–23)
Bilirubin, Direct: 0.1 mg/dL (ref 0.0–0.3)
CO2: 30 meq/L (ref 19–32)
Calcium: 9 mg/dL (ref 8.4–10.5)
Chloride: 105 meq/L (ref 96–112)
Creatinine, Ser: 2.1 mg/dL — ABNORMAL HIGH (ref 0.4–1.5)
Glucose, Bld: 237 mg/dL — ABNORMAL HIGH (ref 70–99)
LDL Cholesterol: 101 mg/dL — ABNORMAL HIGH (ref 0–99)
Prothrombin Time: 29.7 s — ABNORMAL HIGH (ref 9.1–11.7)
Total Bilirubin: 0.7 mg/dL (ref 0.3–1.2)
Total CHOL/HDL Ratio: 4
Triglycerides: 181 mg/dL — ABNORMAL HIGH (ref 0.0–149.0)

## 2009-09-03 ENCOUNTER — Ambulatory Visit: Payer: Self-pay | Admitting: Internal Medicine

## 2009-09-18 ENCOUNTER — Encounter: Payer: Self-pay | Admitting: Internal Medicine

## 2009-09-18 ENCOUNTER — Telehealth: Payer: Self-pay | Admitting: Internal Medicine

## 2009-09-25 ENCOUNTER — Ambulatory Visit: Payer: Self-pay | Admitting: Internal Medicine

## 2009-09-26 ENCOUNTER — Ambulatory Visit: Payer: Self-pay | Admitting: Internal Medicine

## 2009-09-28 ENCOUNTER — Telehealth: Payer: Self-pay | Admitting: Internal Medicine

## 2009-10-08 ENCOUNTER — Encounter: Payer: Self-pay | Admitting: Internal Medicine

## 2009-10-08 ENCOUNTER — Ambulatory Visit: Payer: Self-pay | Admitting: Cardiology

## 2009-10-10 ENCOUNTER — Telehealth: Payer: Self-pay | Admitting: Cardiology

## 2009-10-24 ENCOUNTER — Ambulatory Visit: Payer: Self-pay | Admitting: Internal Medicine

## 2009-10-25 LAB — CONVERTED CEMR LAB
AST: 21 units/L (ref 0–37)
Alkaline Phosphatase: 38 units/L — ABNORMAL LOW (ref 39–117)
Bilirubin, Direct: 0.1 mg/dL (ref 0.0–0.3)
CO2: 28 meq/L (ref 19–32)
Calcium: 8.7 mg/dL (ref 8.4–10.5)
Creatinine, Ser: 2.3 mg/dL — ABNORMAL HIGH (ref 0.4–1.5)
Glucose, Bld: 170 mg/dL — ABNORMAL HIGH (ref 70–99)
Total Bilirubin: 0.5 mg/dL (ref 0.3–1.2)
Total CHOL/HDL Ratio: 4

## 2009-10-30 ENCOUNTER — Encounter: Payer: Self-pay | Admitting: Internal Medicine

## 2009-11-07 ENCOUNTER — Ambulatory Visit: Payer: Self-pay | Admitting: Internal Medicine

## 2009-11-07 LAB — CONVERTED CEMR LAB: INR: 2.8

## 2009-11-21 ENCOUNTER — Ambulatory Visit: Payer: Self-pay | Admitting: Cardiology

## 2009-12-05 ENCOUNTER — Ambulatory Visit: Payer: Self-pay | Admitting: Internal Medicine

## 2009-12-05 LAB — CONVERTED CEMR LAB
Digitoxin Lvl: 0.4 ng/mL — ABNORMAL LOW (ref 0.8–2.0)
INR: 3.1
Prothrombin Time: 21.4 s

## 2009-12-25 ENCOUNTER — Ambulatory Visit: Payer: Self-pay | Admitting: Internal Medicine

## 2009-12-25 LAB — CONVERTED CEMR LAB
AST: 21 units/L (ref 0–37)
Albumin: 3.9 g/dL (ref 3.5–5.2)
Alkaline Phosphatase: 45 units/L (ref 39–117)
CO2: 28 meq/L (ref 19–32)
Calcium: 8.9 mg/dL (ref 8.4–10.5)
Cholesterol: 189 mg/dL (ref 0–200)
Creatinine, Ser: 2.5 mg/dL — ABNORMAL HIGH (ref 0.4–1.5)
Direct LDL: 101.7 mg/dL
Total CHOL/HDL Ratio: 5

## 2009-12-26 ENCOUNTER — Ambulatory Visit: Payer: Self-pay | Admitting: Cardiology

## 2009-12-27 ENCOUNTER — Ambulatory Visit: Payer: Self-pay | Admitting: Cardiovascular Disease

## 2009-12-27 ENCOUNTER — Ambulatory Visit: Payer: Self-pay | Admitting: Cardiology

## 2010-01-01 ENCOUNTER — Ambulatory Visit: Payer: Self-pay | Admitting: Internal Medicine

## 2010-01-08 ENCOUNTER — Ambulatory Visit: Payer: Self-pay | Admitting: Cardiology

## 2010-01-09 ENCOUNTER — Encounter: Payer: Self-pay | Admitting: Cardiology

## 2010-01-09 ENCOUNTER — Ambulatory Visit: Payer: Self-pay | Admitting: Cardiology

## 2010-01-09 ENCOUNTER — Ambulatory Visit: Payer: Self-pay

## 2010-01-09 ENCOUNTER — Ambulatory Visit (HOSPITAL_COMMUNITY): Admission: RE | Admit: 2010-01-09 | Discharge: 2010-01-09 | Payer: Self-pay | Admitting: Cardiology

## 2010-01-12 ENCOUNTER — Emergency Department (HOSPITAL_COMMUNITY): Admission: EM | Admit: 2010-01-12 | Discharge: 2010-01-12 | Payer: Self-pay | Admitting: Emergency Medicine

## 2010-01-15 ENCOUNTER — Ambulatory Visit: Payer: Self-pay | Admitting: Internal Medicine

## 2010-01-15 LAB — CONVERTED CEMR LAB
CO2: 32 meq/L (ref 19–32)
Calcium: 8.9 mg/dL (ref 8.4–10.5)
Glucose, Bld: 149 mg/dL — ABNORMAL HIGH (ref 70–99)
Potassium: 3.8 meq/L (ref 3.5–5.1)
Sodium: 144 meq/L (ref 135–145)

## 2010-02-05 ENCOUNTER — Ambulatory Visit: Payer: Self-pay | Admitting: Internal Medicine

## 2010-02-12 ENCOUNTER — Ambulatory Visit: Payer: Self-pay | Admitting: Internal Medicine

## 2010-02-12 LAB — CONVERTED CEMR LAB: INR: 2.7

## 2010-02-21 ENCOUNTER — Ambulatory Visit: Payer: Self-pay | Admitting: Cardiology

## 2010-03-05 ENCOUNTER — Ambulatory Visit: Payer: Self-pay | Admitting: Internal Medicine

## 2010-03-05 LAB — CONVERTED CEMR LAB
Basophils Absolute: 0 10*3/uL (ref 0.0–0.1)
Basophils Relative: 0.6 % (ref 0.0–3.0)
CO2: 30 meq/L (ref 19–32)
Chloride: 103 meq/L (ref 96–112)
Eosinophils Absolute: 0.1 10*3/uL (ref 0.0–0.7)
HCT: 42.6 % (ref 39.0–52.0)
Hemoglobin: 14.5 g/dL (ref 13.0–17.0)
MCHC: 34.2 g/dL (ref 30.0–36.0)
MCV: 91.3 fL (ref 78.0–100.0)
Monocytes Absolute: 0.6 10*3/uL (ref 0.1–1.0)
Neutro Abs: 5.1 10*3/uL (ref 1.4–7.7)
Potassium: 4.2 meq/L (ref 3.5–5.1)
RBC: 4.66 M/uL (ref 4.22–5.81)
Sodium: 142 meq/L (ref 135–145)
TSH: 0.6 microintl units/mL (ref 0.35–5.50)

## 2010-03-12 ENCOUNTER — Ambulatory Visit: Payer: Self-pay | Admitting: Internal Medicine

## 2010-03-12 LAB — CONVERTED CEMR LAB: INR: 2.3

## 2010-03-26 ENCOUNTER — Telehealth (INDEPENDENT_AMBULATORY_CARE_PROVIDER_SITE_OTHER): Payer: Self-pay | Admitting: *Deleted

## 2010-04-09 ENCOUNTER — Ambulatory Visit: Payer: Self-pay | Admitting: Internal Medicine

## 2010-04-09 LAB — CONVERTED CEMR LAB: INR: 1.9

## 2010-04-30 ENCOUNTER — Ambulatory Visit: Payer: Self-pay | Admitting: Internal Medicine

## 2010-04-30 LAB — CONVERTED CEMR LAB
AST: 24 units/L (ref 0–37)
Alkaline Phosphatase: 37 units/L — ABNORMAL LOW (ref 39–117)
BUN: 45 mg/dL — ABNORMAL HIGH (ref 6–23)
Bilirubin, Direct: 0.2 mg/dL (ref 0.0–0.3)
Calcium: 8.7 mg/dL (ref 8.4–10.5)
Chloride: 104 meq/L (ref 96–112)
Cholesterol: 177 mg/dL (ref 0–200)
Creatinine, Ser: 2.4 mg/dL — ABNORMAL HIGH (ref 0.4–1.5)
Hgb A1c MFr Bld: 7.7 % — ABNORMAL HIGH (ref 4.6–6.5)
LDL Cholesterol: 110 mg/dL — ABNORMAL HIGH (ref 0–99)
Total Protein: 6.4 g/dL (ref 6.0–8.3)

## 2010-05-07 ENCOUNTER — Ambulatory Visit: Payer: Self-pay | Admitting: Internal Medicine

## 2010-05-09 ENCOUNTER — Encounter: Payer: Self-pay | Admitting: Internal Medicine

## 2010-05-09 ENCOUNTER — Ambulatory Visit: Payer: Self-pay

## 2010-05-27 IMAGING — CR DG CHEST 1V PORT
1 series · 1 of 1 positions shown · non-contrast
Comparison: Earlier today at 8323 hours.

CLINICAL DATA: Post pacemaker.  Rule out pneumothorax.  CHF.

PORTABLE CHEST - 1 VIEW

[view not recorded]
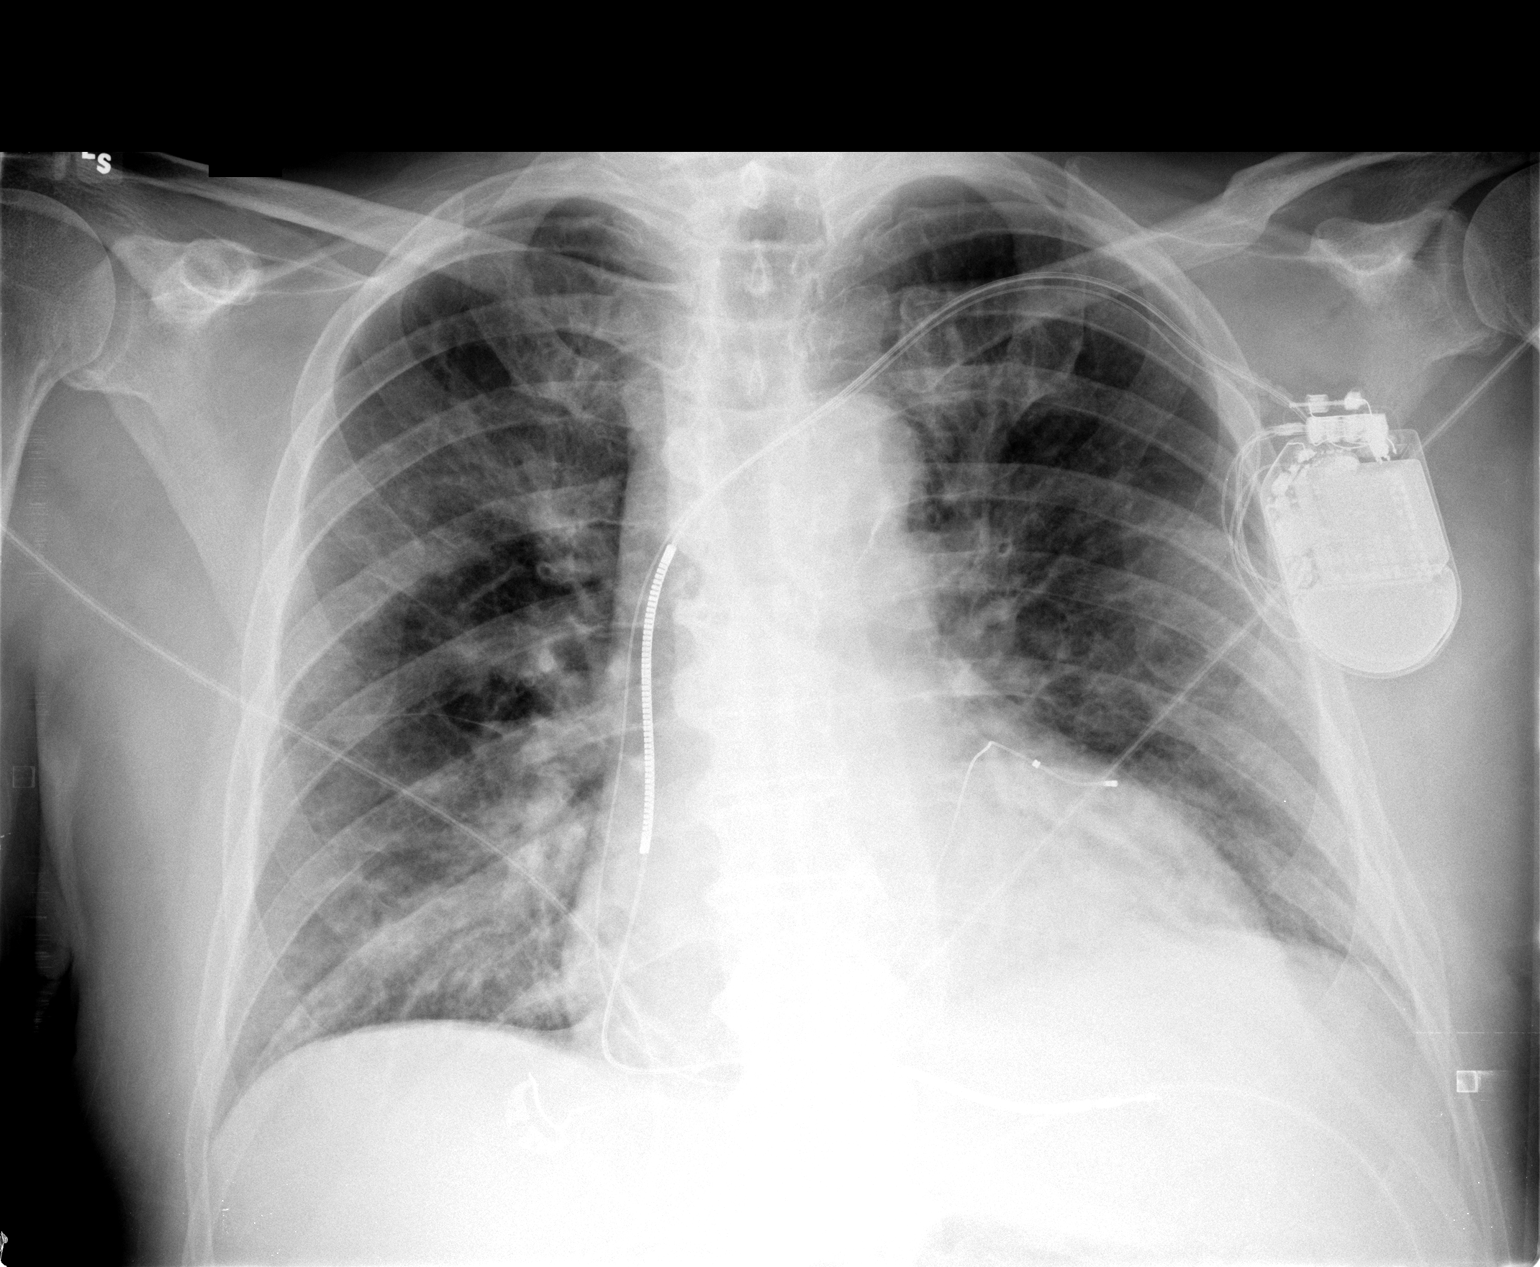

[1 of 1 positions shown; findings below may reference images not displayed]

FINDINGS: 9759 hours.  Pacer / AICD device is unchanged with tips
at right atrium, right ventricle, and coronary sinus.

Midline trachea.  Mild cardiomegaly. No pleural effusion or
pneumothorax.  Mild interstitial edema is slightly improved.
Similar left base air space disease, likely atelectasis.
IMPRESSION: 1. No pneumothorax.
2.  Slight improvement in interstitial edema.
3.  Similar mild left base atelectasis. I personally called and
discussed this report with the patient's nurse  at [DATE] on
07/19/2009.

## 2010-05-28 IMAGING — CR DG CHEST 1V PORT
1 series · 1 of 1 positions shown · non-contrast
Comparison: 07/19/2009

CLINICAL DATA: Post pacemaker insertion.

PORTABLE CHEST - 1 VIEW

[AP]
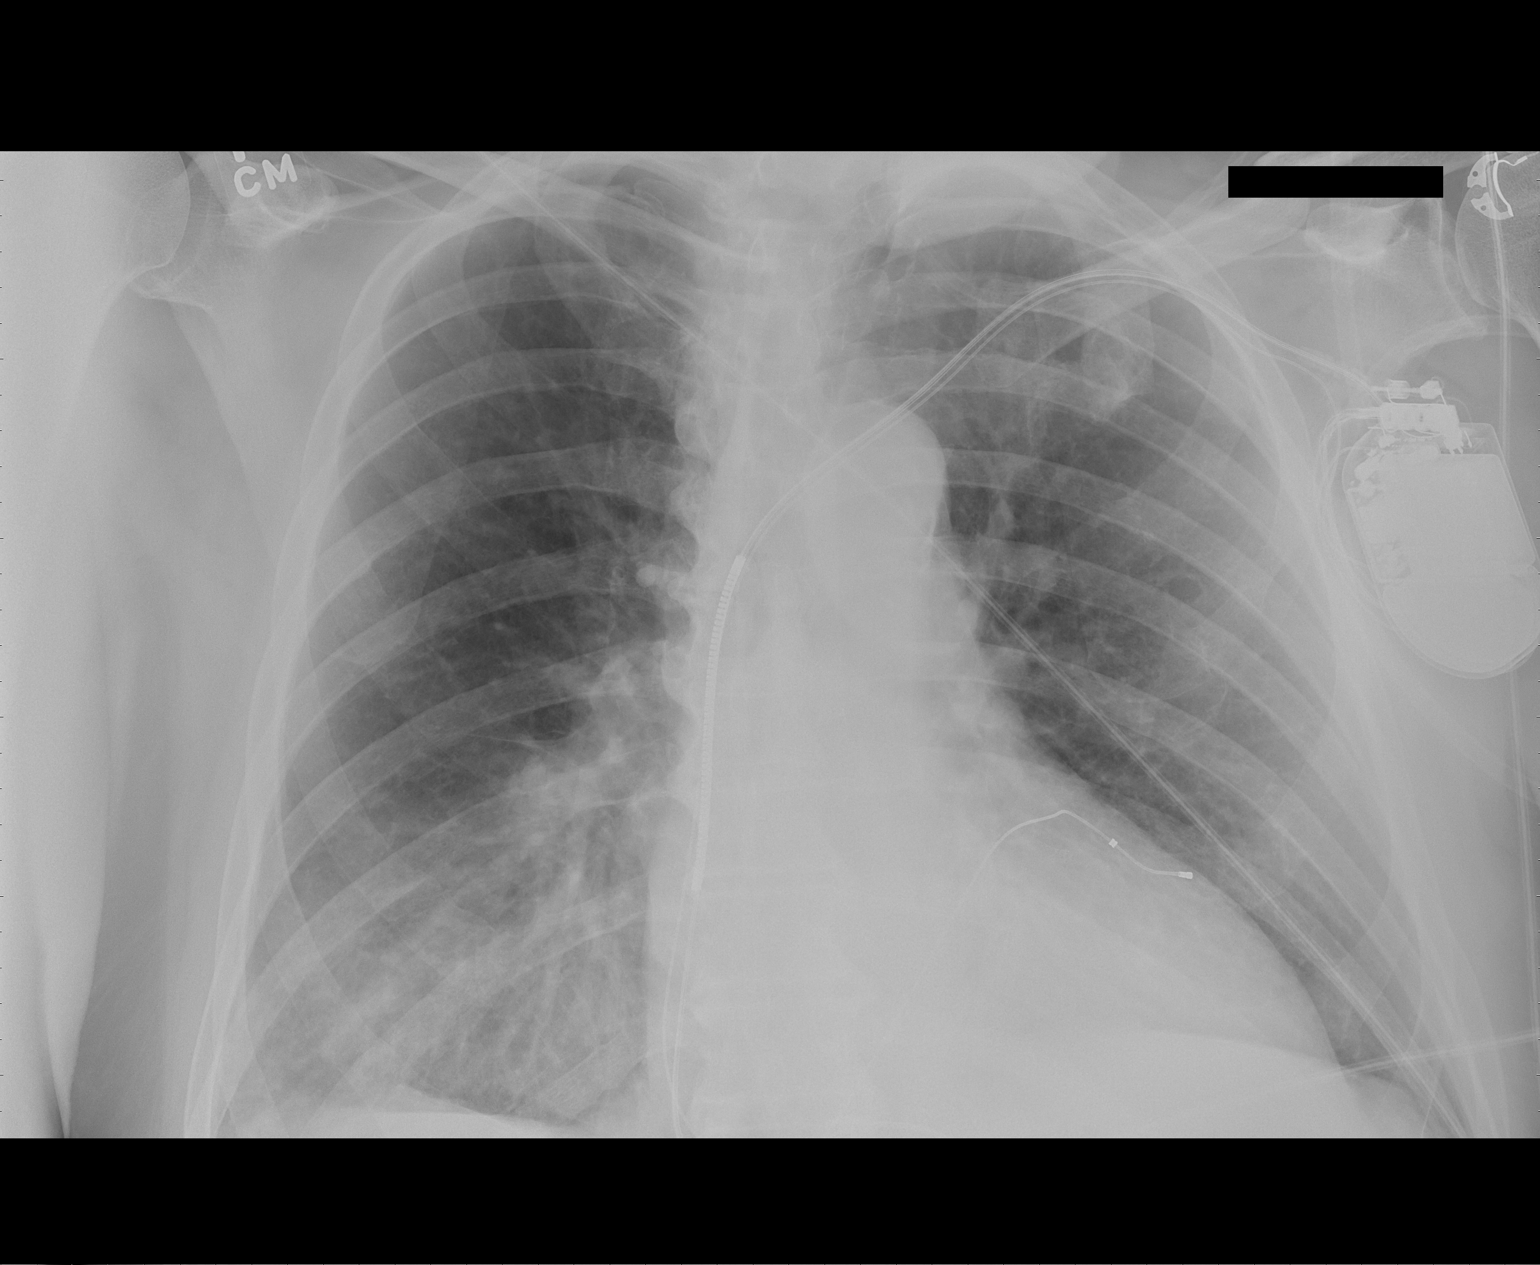

[1 of 1 positions shown; findings below may reference images not displayed]

FINDINGS: Mild under aeration of the right base. Cardiomegaly.  No
CHF.  AICD lead is in the inferior aspect of the RV.  Coronary
sinus lead is appreciated.
IMPRESSION: Cardiomegaly.  No CHF.  Mild right base underaeration.

## 2010-06-04 ENCOUNTER — Encounter: Payer: Self-pay | Admitting: Cardiovascular Disease

## 2010-06-04 ENCOUNTER — Ambulatory Visit: Payer: Self-pay | Admitting: Cardiology

## 2010-06-12 ENCOUNTER — Encounter: Payer: Self-pay | Admitting: Cardiology

## 2010-06-18 ENCOUNTER — Telehealth: Payer: Self-pay | Admitting: Internal Medicine

## 2010-06-25 ENCOUNTER — Ambulatory Visit: Payer: Self-pay | Admitting: Internal Medicine

## 2010-06-27 ENCOUNTER — Ambulatory Visit: Payer: Self-pay | Admitting: Internal Medicine

## 2010-06-27 DIAGNOSIS — H538 Other visual disturbances: Secondary | ICD-10-CM | POA: Insufficient documentation

## 2010-07-09 ENCOUNTER — Ambulatory Visit: Payer: Self-pay | Admitting: Internal Medicine

## 2010-07-12 LAB — CONVERTED CEMR LAB
Basophils Absolute: 0 10*3/uL (ref 0.0–0.1)
CO2: 29 meq/L (ref 19–32)
Eosinophils Absolute: 0.1 10*3/uL (ref 0.0–0.7)
Glucose, Bld: 143 mg/dL — ABNORMAL HIGH (ref 70–99)
HCT: 42 % (ref 39.0–52.0)
Hemoglobin: 14.5 g/dL (ref 13.0–17.0)
Lymphs Abs: 2 10*3/uL (ref 0.7–4.0)
MCHC: 34.6 g/dL (ref 30.0–36.0)
Monocytes Absolute: 0.6 10*3/uL (ref 0.1–1.0)
Neutro Abs: 4.5 10*3/uL (ref 1.4–7.7)
Potassium: 3.8 meq/L (ref 3.5–5.1)
RDW: 14.1 % (ref 11.5–14.6)
Sodium: 142 meq/L (ref 135–145)

## 2010-07-31 ENCOUNTER — Encounter: Payer: Self-pay | Admitting: Internal Medicine

## 2010-08-02 ENCOUNTER — Encounter: Payer: Self-pay | Admitting: Internal Medicine

## 2010-08-07 ENCOUNTER — Ambulatory Visit: Payer: Self-pay | Admitting: Internal Medicine

## 2010-08-12 ENCOUNTER — Encounter: Payer: Self-pay | Admitting: Internal Medicine

## 2010-08-13 ENCOUNTER — Ambulatory Visit: Payer: Self-pay | Admitting: Internal Medicine

## 2010-08-19 ENCOUNTER — Ambulatory Visit: Payer: Self-pay | Admitting: Internal Medicine

## 2010-09-02 ENCOUNTER — Encounter: Payer: Self-pay | Admitting: Internal Medicine

## 2010-09-04 ENCOUNTER — Ambulatory Visit: Payer: Self-pay | Admitting: Internal Medicine

## 2010-09-12 LAB — CONVERTED CEMR LAB
BUN: 61 mg/dL — ABNORMAL HIGH (ref 6–23)
Chloride: 103 meq/L (ref 96–112)
Potassium: 4.9 meq/L (ref 3.5–5.1)

## 2010-09-25 ENCOUNTER — Telehealth: Payer: Self-pay | Admitting: Internal Medicine

## 2010-09-27 ENCOUNTER — Ambulatory Visit
Admission: RE | Admit: 2010-09-27 | Discharge: 2010-09-27 | Payer: Self-pay | Source: Home / Self Care | Attending: Internal Medicine | Admitting: Internal Medicine

## 2010-09-27 ENCOUNTER — Other Ambulatory Visit: Payer: Self-pay | Admitting: Internal Medicine

## 2010-09-27 LAB — HEPATIC FUNCTION PANEL
ALT: 23 U/L (ref 0–53)
AST: 21 U/L (ref 0–37)
Albumin: 3.9 g/dL (ref 3.5–5.2)
Alkaline Phosphatase: 42 U/L (ref 39–117)
Bilirubin, Direct: 0.1 mg/dL (ref 0.0–0.3)
Total Bilirubin: 0.8 mg/dL (ref 0.3–1.2)
Total Protein: 6.9 g/dL (ref 6.0–8.3)

## 2010-09-27 LAB — BASIC METABOLIC PANEL
BUN: 52 mg/dL — ABNORMAL HIGH (ref 6–23)
CO2: 21 mEq/L (ref 19–32)
Calcium: 9.1 mg/dL (ref 8.4–10.5)
Chloride: 106 mEq/L (ref 96–112)
Creatinine, Ser: 2.7 mg/dL — ABNORMAL HIGH (ref 0.4–1.5)
GFR: 24.86 mL/min — ABNORMAL LOW (ref >60.00)
Glucose, Bld: 267 mg/dL — ABNORMAL HIGH (ref 70–99)
Potassium: 4.7 mEq/L (ref 3.5–5.1)
Sodium: 136 mEq/L (ref 135–145)

## 2010-09-27 LAB — LIPID PANEL
Cholesterol: 191 mg/dL (ref 0–200)
HDL: 38.5 mg/dL — ABNORMAL LOW (ref >39.00)
Total CHOL/HDL Ratio: 5
Triglycerides: 271 mg/dL — ABNORMAL HIGH (ref 0.0–149.0)
VLDL: 54.2 mg/dL — ABNORMAL HIGH (ref 0.0–40.0)

## 2010-09-27 LAB — LDL CHOLESTEROL, DIRECT: Direct LDL: 113.8 mg/dL

## 2010-09-27 LAB — HEMOGLOBIN A1C: Hgb A1c MFr Bld: 9.6 % — ABNORMAL HIGH (ref 4.6–6.5)

## 2010-09-27 LAB — TSH: TSH: 0.82 u[IU]/mL (ref 0.35–5.50)

## 2010-10-11 ENCOUNTER — Ambulatory Visit
Admission: RE | Admit: 2010-10-11 | Discharge: 2010-10-11 | Payer: Self-pay | Source: Home / Self Care | Attending: Cardiology | Admitting: Cardiology

## 2010-10-11 ENCOUNTER — Encounter: Payer: Self-pay | Admitting: Cardiology

## 2010-10-16 ENCOUNTER — Encounter: Payer: Self-pay | Admitting: Cardiology

## 2010-10-16 ENCOUNTER — Ambulatory Visit
Admission: RE | Admit: 2010-10-16 | Discharge: 2010-10-16 | Payer: Self-pay | Source: Home / Self Care | Attending: Cardiology | Admitting: Cardiology

## 2010-10-18 ENCOUNTER — Encounter: Payer: Self-pay | Admitting: Cardiology

## 2010-10-20 LAB — CONVERTED CEMR LAB
ALT: 21 units/L (ref 0–53)
BUN: 36 mg/dL — ABNORMAL HIGH (ref 6–23)
BUN: 48 mg/dL — ABNORMAL HIGH (ref 6–23)
Basophils Absolute: 0 10*3/uL (ref 0.0–0.1)
Basophils Relative: 0 % (ref 0.0–3.0)
Basophils Relative: 0.5 % (ref 0.0–3.0)
CO2: 30 meq/L (ref 19–32)
CO2: 31 meq/L (ref 19–32)
Calcium: 9.3 mg/dL (ref 8.4–10.5)
Chloride: 106 meq/L (ref 96–112)
Chloride: 108 meq/L (ref 96–112)
Creatinine, Ser: 2.2 mg/dL — ABNORMAL HIGH (ref 0.4–1.5)
Creatinine, Ser: 2.3 mg/dL — ABNORMAL HIGH (ref 0.4–1.5)
Creatinine, Ser: 3.1 mg/dL — ABNORMAL HIGH (ref 0.4–1.5)
Eosinophils Absolute: 0.1 10*3/uL (ref 0.0–0.7)
Eosinophils Absolute: 0.1 10*3/uL (ref 0.0–0.7)
GFR calc Af Amer: 25 mL/min
GFR calc Af Amer: 33 mL/min
GFR calc non Af Amer: 21 mL/min
GFR calc non Af Amer: 27 mL/min
Glucose, Bld: 201 mg/dL — ABNORMAL HIGH (ref 70–99)
Hemoglobin: 14.9 g/dL (ref 13.0–17.0)
MCHC: 33.4 g/dL (ref 30.0–36.0)
MCHC: 36 g/dL (ref 30.0–36.0)
MCV: 90.5 fL (ref 78.0–100.0)
MCV: 91.5 fL (ref 78.0–100.0)
Monocytes Absolute: 0.4 10*3/uL (ref 0.1–1.0)
Monocytes Absolute: 0.5 10*3/uL (ref 0.1–1.0)
Neutro Abs: 3.5 10*3/uL (ref 1.4–7.7)
Neutro Abs: 6.2 10*3/uL (ref 1.4–7.7)
Neutrophils Relative %: 73 % (ref 43.0–77.0)
Potassium: 3.2 meq/L — ABNORMAL LOW (ref 3.5–5.1)
Pro B Natriuretic peptide (BNP): 290 pg/mL — ABNORMAL HIGH (ref 0.0–100.0)
RBC: 4.54 M/uL (ref 4.22–5.81)
RBC: 5.29 M/uL (ref 4.22–5.81)
RDW: 13.9 % (ref 11.5–14.6)
TSH: 1.13 microintl units/mL (ref 0.35–5.50)
Total Bilirubin: 1 mg/dL (ref 0.3–1.2)

## 2010-10-22 NOTE — Cardiovascular Report (Signed)
Summary: Office Visit   Office Visit   Imported By: Roderic Ovens 02/12/2010 16:08:36  _____________________________________________________________________  External Attachment:    Type:   Image     Comment:   External Document

## 2010-10-22 NOTE — Cardiovascular Report (Signed)
Summary: Office Visit   Office Visit   Imported By: Roderic Ovens 08/26/2010 16:12:18  _____________________________________________________________________  External Attachment:    Type:   Image     Comment:   External Document

## 2010-10-22 NOTE — Assessment & Plan Note (Signed)
Summary: pt/njr  Nurse Visit   Allergies: 1)  ! * Betablockers ? Laboratory Results   Blood Tests      INR: 2.3   (Normal Range: 0.88-1.12   Therap INR: 2.0-3.5) Comments: Rita Ohara  March 12, 2010 10:04 AM     Orders Added: 1)  Est. Patient Level I [99211] 2)  Protime [[id number]]   ANTICOAGULATION RECORD PREVIOUS REGIMEN & LAB RESULTS Anticoagulation Diagnosis:  Atrial Fibrillation on  07/16/2009 Previous INR Goal Range:  2.0-3.0 on  02/24/2008 Previous INR:  4.4 on  03/05/2010 Previous Coumadin Dose(mg):  5mg  on m,w,f 2.5mg  other days on  03/05/2010 Previous Regimen:  2.5mg  qd on  03/05/2010 Previous Coagulation Comments:  OV on  06/13/2008  NEW REGIMEN & LAB RESULTS Current INR: 2.3 Regimen: same  Repeat testing in: 4 weeks  Anticoagulation Visit Questionnaire Coumadin dose missed/changed:  No Abnormal Bleeding Symptoms:  No  Any diet changes including alcohol intake, vegetables or greens since the last visit:  No Any illnesses or hospitalizations since the last visit:  No Any signs of clotting since the last visit (including chest discomfort, dizziness, shortness of breath, arm tingling, slurred speech, swelling or redness in leg):  No  MEDICATIONS WARFARIN SODIUM 5 MG TABS (WARFARIN SODIUM) 5 mg M-W-F, 2.5 mg other days ASPIRIN 81 MG  TBEC (ASPIRIN) one by mouth every day FUROSEMIDE 80 MG TABS (FUROSEMIDE) 1  tab once daily HYDRALAZINE HCL 25 MG TABS (HYDRALAZINE HCL) Take 1 tablet by mouth three times a day KLOR-CON M20 20 MEQ CR-TABS (POTASSIUM CHLORIDE CRYS CR) take 2 tablets AM TOPROL XL 100 MG XR24H-TAB (METOPROLOL SUCCINATE) take one tablet by mouth twice a day NITROSTAT 0.4 MG SUBL (NITROGLYCERIN) as needed DIGOXIN 0.125 MG TABS (DIGOXIN) Take a one tablet by mouth daily GLYBURIDE 1.25 MG TABS (GLYBURIDE) Take 1 tablet by mouth once a day with breakfast FREESTYLE LITE TEST  STRP (GLUCOSE BLOOD) check blood sugar daily  DX 250.00 JANUVIA 100 MG  TABS (SITAGLIPTIN PHOSPHATE) Take 1 tab by mouth daily ISOSORBIDE MONONITRATE CR 30 MG XR24H-TAB (ISOSORBIDE MONONITRATE) Take one-half  tablet by mouth daily

## 2010-10-22 NOTE — Assessment & Plan Note (Signed)
Summary: protime labs//ccm  Nurse Visit   Allergies: 1)  ! * Betablockers ? Laboratory Results   Blood Tests     PT: 20.1 s   (Normal Range: 10.6-13.4)  INR: 2.8   (Normal Range: 0.88-1.12   Therap INR: 2.0-3.5) Comments: [name] [name]  November 07, 2009 10:20 AM     Orders Added: 1)  Est. Patient Level I [99211] 2)  Protime [[id number]]   ANTICOAGULATION RECORD PREVIOUS REGIMEN & LAB RESULTS Anticoagulation Diagnosis:  Atrial Fibrillation on  07/16/2009 Previous INR Goal Range:  2.0-3.0 on  02/24/2008 Previous INR:  1.8 on  10/24/2009 Previous Coumadin Dose(mg):  5mg  on  07/25/2009 Previous Regimen:  5mg . M, W, F, Sat. all others 2.5mg  on  10/24/2009 Previous Coagulation Comments:  OV on  06/13/2008  NEW REGIMEN & LAB RESULTS Current INR: 2.8 Regimen: same  Repeat testing in: 1 month

## 2010-10-22 NOTE — Assessment & Plan Note (Signed)
Summary: fup on labs//ccm ALSO PT/NJR----PT RSC (BMP) // RS   Vital Signs:  Patient profile:   77 year old male Pulse rate:   70 / minute Pulse rhythm:   regular BP sitting:   155 / 80  Primary Care Provider:  Birdie Sons MD   History of Present Illness: CHF---no sxs no trouble with ICD  Headaches---much better/resolved  HTN--bps 140/70s, HR 71  AFIB, no sxs  Current Problems (verified): 1)  Implantation of Defibrillator, St Jude Unify  (ICD-V45.02) 2)  Cardiomyopathy, Primary, Dilated  (ICD-425.4) 3)  Coronary Artery Disease  (ICD-414.00) 4)  Coumadin Therapy  (ICD-V58.61) 5)  Atrial Fibrillation  (ICD-427.31) 6)  Congestive Heart Failure  (ICD-428.0) 7)  Hyperlipidemia  (ICD-272.4) 8)  Hypertension  (ICD-401.9) 9)  Diabetes Mellitus, Type II  (ICD-250.00) 10)  Renal Disease, Chronic, Stage Iii  (ICD-585.3) 11)  Total Knee Replacement, Hx of  (ICD-V45.89) 12)  Sleep Apnea  (ICD-780.57) 13)  Hematuria Unspecified  (ICD-599.70) 14)  Prostate Cancer, Hx of  (ICD-V10.46) 15)  Colon Cancer, Hx of  (ICD-V10.05) 16)  Osteoarthritis  (ICD-715.90) 17)  Skin Cancer, Hx of  (ICD-V10.83)  Current Medications (verified): 1)  Warfarin Sodium 5 Mg Tabs (Warfarin Sodium) .... 5 Mg M-W-F, 2.5 Mg Other Days 2)  Aspirin 81 Mg  Tbec (Aspirin) .... One By Mouth Every Day 3)  Furosemide 80 Mg Tabs (Furosemide) .[name].. 1  Tab Once Daily 4)  Hydralazine Hcl 25 Mg Tabs (Hydralazine Hcl) .... Take 1 Tablet By Mouth Three Times A Day 5)  Klor-Con M20 20 Meq Cr-Tabs (Potassium Chloride Crys Cr) .... Take 2 Tablets Am 6)  Omeprazole 20 Mg Cpdr (Omeprazole) .... One By Mouth Daily 7)  Toprol Xl 100 Mg Xr24h-Tab (Metoprolol Succinate) .... Take One Tablet By Mouth Twice A Day 8)  Nitrostat 0.4 Mg Subl (Nitroglycerin) .... As Needed 9)  Digoxin 0.125 Mg Tabs (Digoxin) .... Take A One Tablet By Mouth Daily 10)  Glyburide 1.25 Mg Tabs (Glyburide) .... Take 1 Tablet By Mouth Once A Day With  Breakfast 11)  Freestyle Lite Test  Strp (Glucose Blood) .... Check Blood Sugar Daily  Dx 250.00 12)  Januvia 100 Mg Tabs (Sitagliptin Phosphate) .... Take 1 Tab By Mouth Daily  Allergies (verified): 1)  ! * Betablockers ?  Past History:  Past Medical History: Last updated: 08/14/2009 Current Problems:  Implantation of AICD- St. Jude Unify CORONARY ARTERY DISEASE (ICD-414.00)-lad stenting nonischemic cardiomyopathy COUMADIN THERAPY (ICD-V58.61) ATRIAL FIBRILLATION (ICD-427.31) CONGESTIVE HEART FAILURE-chronic systolic HYPERLIPIDEMIA (ICD-272.4) HYPERTENSION (ICD-401.9) DIABETES MELLITUS, TYPE II (ICD-250.00) RENAL DISEASE, CHRONIC, STAGE III (ICD-585.3) ENCOUNTER FOR LONG-TERM USE OF OTHER MEDICATIONS (ICD-V58.69) CONSTIPATION (ICD-564.00) TOTAL KNEE REPLACEMENT, HX OF (ICD-V45.89) HYPERKALEMIA (ICD-276.7) SLEEP APNEA (ICD-780.57) SPECIAL SCREENING FOR MALIGNANT NEOPLASMS COLON (ICD-V76.51) HEMATURIA UNSPECIFIED (ICD-599.70) ENCOUNTER FOR THERAPEUTIC DRUG MONITORING (ICD-V58.83) ADVEF, DRUG/MEDICINAL/BIOLOGICAL SUBST NOS (ICD-995.20) PROSTATE CANCER, HX OF (ICD-V10.46) COLON CANCER, HX OF (ICD-V10.05) OSTEOARTHRITIS (ICD-715.90) SKIN CANCER, HX OF (ICD-V10.83)  Past Surgical History: Last updated: 08/14/2009 Colectomy Prostatectomy 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  Jude Unify  Family History: Last updated: 04/18/2009 Positive for coronary artery disease on his father, otherwise noncontributory.  Social History: Last updated: 04/18/2009  The patient lives here in [place] with his wife.  He   is retired from [employer].  He quit smoking 15 years ago.  Denies any   recreational or alcohol use.  Tries to follow a heart-healthy diet.  For  exercise, he is actually participating in rehab post knee surgery 2 days   a week and then also does some exercises in range of  motion on his own.      Risk Factors: Exercise: yes (07/13/2006)  Risk Factors: Smoking Status: quit > 6 months (10/24/2009)  Review of Systems       All other systems reviewed and were negative   Physical Exam  General:  alert and well-developed.   Head:  normocephalic and atraumatic.   Eyes:  pupils equal and pupils round.   Ears:  R ear normal and L ear normal.   Neck:  No deformities, masses, or tenderness noted. Chest Wall:  no deformities.   Lungs:  normal respiratory effort and no intercostal retractions.   Heart:  normal rate and regular rhythm.   Abdomen:  soft, non-tender, and normal bowel sounds.   overweight Msk:  No deformity or scoliosis noted of thoracic or lumbar spine.   Neurologic:  cranial nerves II-XII intact and gait normal.    Diabetes Management Exam:    Eye Exam:       Eye Exam done elsewhere          Date: 09/22/2009          Results: normal-pt's report          Done by: ophthalm   Impression & Recommendations:  Problem # 1:  DIABETES MELLITUS, TYPE II (ICD-250.00) Assessment Improved much better continue current medications  His updated medication list for this problem includes:    Aspirin 81 Mg Tbec (Aspirin) ..... One by mouth every day    Glyburide 1.25 Mg Tabs (Glyburide) .[name]... Take 1 tablet by mouth once a day with breakfast    Januvia 100 Mg Tabs (Sitagliptin phosphate) .[name]... Take 1 tab by mouth daily  Labs Reviewed: Creat: 2.5 (12/25/2009)     Last Eye Exam: normal-pt's report (09/22/2008) Reviewed HgBA1c results: 7.2 (12/25/2009)  8.6 (08/28/2009)  Problem # 2:  ATRIAL FIBRILLATION (ICD-427.31) rate controlled His updated medication list for this problem includes:    Warfarin Sodium 5 Mg Tabs (Warfarin sodium) .Marland KitchenMarland KitchenMarland KitchenMarland Kitchen 5 mg m-w-f, 2.5 mg other days (this will be new dose -- currently on 5 mg m,w,f,sat)    Aspirin 81 Mg Tbec (Aspirin) ..... One by mouth every day    Toprol Xl 100 Mg Xr24h-tab (Metoprolol succinate) .[name]... Take  one tablet by mouth twice a day    Digoxin 0.125 Mg Tabs (Digoxin) .[name]... Take a one tablet by mouth daily  Orders: Protime ([id number]) Fingerstick ([id number])  Reviewed the following: PT: 24.2 (01/01/2010)   INR: 4.0 (01/01/2010) Coumadin Dose (weekly): 25 mg (08/07/2009) Prior Coumadin Dose (weekly): 25 mg (08/07/2009) Next Protime: 1 month (dated on 12/05/2009)  Problem # 3:  CONGESTIVE HEART FAILURE (ICD-428.0) clinically stable.  Continue current medications.  He has no current symptoms.  He has regular followup with cardiology. His updated medication list for this problem includes:    Warfarin Sodium 5 Mg Tabs (Warfarin sodium) .Marland KitchenMarland KitchenMarland KitchenMarland Kitchen 5 mg m-w-f, 2.5 mg other days    Aspirin 81 Mg Tbec (Aspirin) ..... One by mouth every day    Furosemide 80 Mg Tabs (Furosemide) .[name]... 1  tab once daily    Toprol Xl 100 Mg Xr24h-tab (Metoprolol succinate) .[name]... Take one tablet by mouth twice a day    Digoxin 0.125 Mg Tabs (Digoxin) .[name]... Take a one tablet by mouth daily  LVEF: 15 (06/21/2009)  Echocardiogram: Study Conclusions    1. Left  ventricle: The cavity size was normal. Wall thickness was      increased in a pattern of mild LVH. There was mild concentric      hypertrophy. The estimated ejection fraction was 15%. Diffuse      hypokinesis. Probably moderate diastolic dysfunction.   2. Aortic valve: Transvalvular velocity was within the normal range.      There was no stenosis. Mild regurgitation.   3. Mitral valve: Mild to moderate regurgitation.   4. Left atrium: The atrium was mildly dilated.   5. Right ventricle: The cavity size was normal. Systolic function      was mildly reduced.   6. Right atrium: The atrium was mildly dilated.   7. Tricuspid valve: Moderate regurgitation.   8. Pulmonary arteries: PA peak pressure: 47mm Hg (S).   9. Systemic veins: IVC measures 1.9 cm with some respirophasic      variation. Estimated RA pressure was 10 mmHg.   Impressions:    - The patient was in  atrial fibrillation during this study. Normal     LV size with severe systolic dysfunction, EF 15%. There is global     hypokinesis. There is moderate diastolic dysfunction. Mild to     moderate mitral regurgitation. Mild pulmonary hypertension. Normal     RV size with mild systolic dysfunction.  (06/21/2009)  Problem # 4:  COUMADIN THERAPY (ICD-V58.61) controlled Orders: Protime ([id number]) Fingerstick ([id number])  Complete Medication List: 1)  Warfarin Sodium 5 Mg Tabs (Warfarin sodium) .... 5 mg m-w-f, 2.5 mg other days 2)  Aspirin 81 Mg Tbec (Aspirin) .... One by mouth every day 3)  Furosemide 80 Mg Tabs (Furosemide) .[name].. 1  tab once daily 4)  Hydralazine Hcl 25 Mg Tabs (Hydralazine hcl) .... Take 1 tablet by mouth three times a day 5)  Klor-con M20 20 Meq Cr-tabs (Potassium chloride crys cr) .... Take 2 tablets am 6)  Toprol Xl 100 Mg Xr24h-tab (Metoprolol succinate) .... Take one tablet by mouth twice a day 7)  Nitrostat 0.4 Mg Subl (Nitroglycerin) .... As needed 8)  Digoxin 0.125 Mg Tabs (Digoxin) .... Take a one tablet by mouth daily 9)  Glyburide 1.25 Mg Tabs (Glyburide) .... Take 1 tablet by mouth once a day with breakfast 10)  Freestyle Lite Test Strp (Glucose blood) .... Check blood sugar daily  dx 250.00 11)  Januvia 100 Mg Tabs (Sitagliptin phosphate) .... Take 1 tab by mouth daily  Patient Instructions: 1)  Please schedule a follow-up appointment in 2 weeks.protime only 2)  Please schedule a follow-up appointment in 2 months. to see me   ANTICOAGULATION RECORD PREVIOUS REGIMEN & LAB RESULTS Anticoagulation Diagnosis:  Atrial Fibrillation on  07/16/2009 Previous INR Goal Range:  2.0-3.0 on  02/24/2008 Previous INR:  3.1 on  12/05/2009 Previous Coumadin Dose(mg):  5mg  on  07/25/2009 Previous Regimen:  same on  12/05/2009 Previous Coagulation Comments:  OV on  06/13/2008  NEW REGIMEN & LAB RESULTS Current INR: 4.0 Regimen: same  (no change)   Anticoagulation Visit  Questionnaire Coumadin dose missed/changed:  No Abnormal Bleeding Symptoms:  No  Any diet changes including alcohol intake, vegetables or greens since the last visit:  No Any illnesses or hospitalizations since the last visit:  No Any signs of clotting since the last visit (including chest discomfort, dizziness, shortness of breath, arm tingling, slurred speech, swelling or redness in leg):  No  MEDICATIONS WARFARIN SODIUM 5 MG TABS (WARFARIN SODIUM) 5 mg M-W-F, 2.5 mg other days ASPIRIN  81 MG  TBEC (ASPIRIN) one by mouth every day FUROSEMIDE 80 MG TABS (FUROSEMIDE) 1  tab once daily HYDRALAZINE HCL 25 MG TABS (HYDRALAZINE HCL) Take 1 tablet by mouth three times a day KLOR-CON M20 20 MEQ CR-TABS (POTASSIUM CHLORIDE CRYS CR) take 2 tablets AM TOPROL XL 100 MG XR24H-TAB (METOPROLOL SUCCINATE) take one tablet by mouth twice a day NITROSTAT 0.4 MG SUBL (NITROGLYCERIN) as needed DIGOXIN 0.125 MG TABS (DIGOXIN) Take a one tablet by mouth daily GLYBURIDE 1.25 MG TABS (GLYBURIDE) Take 1 tablet by mouth once a day with breakfast FREESTYLE LITE TEST  STRP (GLUCOSE BLOOD) check blood sugar daily  DX 250.00 JANUVIA 100 MG TABS (SITAGLIPTIN PHOSPHATE) Take 1 tab by mouth daily    Laboratory Results   Blood Tests   Date/Time Received: January 01, 2010 11:48 AM Date/Time Reported: January 01, 2010 11:48 AM  PT: 24.2 s   (Normal Range: 10.6-13.4)  INR: 4.0   (Normal Range: 0.88-1.12   Therap INR: 2.0-3.5) Comments: Rita Ohara  January 01, 2010 11:48 AM

## 2010-10-22 NOTE — Progress Notes (Signed)
Summary: Samples of Januvia  Phone Note Call from Patient Call back at Home Phone [phone]   Caller: Patient Call For: Birdie Sons MD Summary of Call: Pt is asking for Januvia samples. Initial call taken by: Lynann Beaver CMA,  June 18, 2010 10:49 AM  Follow-up for Phone Call        ok Follow-up by: Birdie Sons MD,  June 18, 2010 1:28 PM  Additional Follow-up for Phone Call Additional follow up Details #1::        Pt is aware that sample of Januvia are available at front desk Additional Follow-up by: Kathrynn Speed CMA,  June 18, 2010 1:54 PM

## 2010-10-22 NOTE — Assessment & Plan Note (Signed)
Summary: 10 days fup per dr//ccm   Vital Signs:  Patient profile:   77 year old [name] Weight:      193 pounds Temp:     98.4 degrees F oral Pulse rate:   72 / minute Pulse rhythm:   regular BP sitting:   160 / 98  (left arm) Cuff size:   regular  Vitals Entered By: Alfred Levins, CMA (August 19, 2010 8:26 AM) CC: blurred vision caused by furosemide   Primary Care Provider:  Birdie Sons MD  CC:  blurred vision caused by furosemide.  History of Present Illness:  Follow-Up Visit      This is a 77 year old man who presents for Follow-up visit.  The patient denies chest pain, palpitations, edema, and SOB.  Since the last visit the patient notes no new problems or concerns except notes that furosemide causes blurrier vision.  The patient reports taking meds as prescribed.  When questioned about possible medication side effects, the patient notes none.  He is feeling much better on higher dose of furosemide---80 mg by mouth once daily ( he did not alternate dose)  All other systems reviewed and were negative   Current Problems (verified): 1)  Blurred Vision  (ICD-368.8) 2)  Encounter For Therapeutic Drug Monitoring  (ICD-V58.[name]) 3)  Implantation of Defibrillator, St Jude Unify  (ICD-V45.02) 4)  Cardiomyopathy, Primary, Dilated  (ICD-425.4) 5)  Coronary Artery Disease  (ICD-414.00) 6)  Coumadin Therapy  (ICD-V58.61) 7)  Atrial Fibrillation  (ICD-427.31) 8)  Congestive Heart Failure  (ICD-428.0) 9)  Hyperlipidemia  (ICD-272.4) 10)  Hypertension  (ICD-401.9) 11)  Diabetes Mellitus, Type II  (ICD-250.00) 12)  Renal Disease, Chronic, Stage Iii  (ICD-585.3) 13)  Sleep Apnea  (ICD-780.57) 14)  Prostate Cancer, Hx of  (ICD-V10.46) 15)  Colon Cancer, Hx of  (ICD-V10.05) 16)  Osteoarthritis  (ICD-715.90) 17)  Skin Cancer, Hx of  (ICD-V10.[name])  Current Medications (verified): 1)  Warfarin Sodium 5 Mg Tabs (Warfarin Sodium) .... Take As Directed 2)  Aspirin 81 Mg  Tbec (Aspirin) ....  One By Mouth Every Day 3)  Furosemide 80 Mg Tabs (Furosemide) .... 1/2 Every Other Day Alternating With Take One Tablet By Mouth Every Other Day 4)  Hydralazine Hcl 25 Mg Tabs (Hydralazine Hcl) .... Take 1 Tablet By Mouth Three Times A Day 5)  Klor-Con M20 20 Meq Cr-Tabs (Potassium Chloride Crys Cr) .... Take 2 Tablets Am 6)  Metoprolol Succinate 100 Mg Xr24h-Tab (Metoprolol Succinate) .... Take 1 Tablet By Mouth Two Times A Day 7)  Nitrostat 0.4 Mg Subl (Nitroglycerin) .... As Needed 8)  Lanoxin 0.125 Mg Tabs (Digoxin) .[name].. 1 Tab Daily 9)  Glyburide 1.25 Mg Tabs (Glyburide) .... Take 1 Tablet By Mouth Once A Day With Breakfast 10)  Freestyle Lite Test  Strp (Glucose Blood) .... Check Blood Sugar Daily  Dx 250.00 11)  Isosorbide Mononitrate Cr 30 Mg Xr24h-Tab (Isosorbide Mononitrate) .... Take One-Half  Tablet By Mouth Daily  Allergies (verified): No Known Drug Allergies  Physical Exam  General:  well-developed well-nourished [name] in no acute distress. HEENT exam atraumatic, normocephalic symmetric her muscles are intact. Neck is supple. Jugular venous pressure is normal. Chest her auscultation cardiac exam S1-S2 are regular with a 2/6 without murmur. Extremities there is no clubbing cyanosis or edema. (Edema has resolved).   Impression & Recommendations:  Problem # 1:  CONGESTIVE HEART FAILURE (ICD-428.0) clinically is doing much better. Reviewed Dr. Odessa Fleming note. I'll start him on spironolactone a  low dose. In regards to his diuretics will continue the same dose. Might consider changing torsemide next office visit. He would prefer not making any major changes is close to the holidays. We'll check labs next visit. His updated medication list for this problem includes:    Warfarin Sodium 5 Mg Tabs (Warfarin sodium) .[name]... Take as directed    Aspirin 81 Mg Tbec (Aspirin) ..... One by mouth every day    Furosemide 80 Mg Tabs (Furosemide) .[name]... Take 1 tablet by mouth once a day    Metoprolol  Succinate 100 Mg Xr24h-tab (Metoprolol succinate) .[name]... Take 1 tablet by mouth two times a day    Lanoxin 0.125 Mg Tabs (Digoxin) .[name]... 1 tab daily    Spironolactone 25 Mg Tabs (Spironolactone) .[name]... Take 1 tablet by mouth once a day  Problem # 2:  CORONARY ARTERY DISEASE (ICD-414.00) no symptoms. Continue current medications. His updated medication list for this problem includes:    Aspirin 81 Mg Tbec (Aspirin) ..... One by mouth every day    Furosemide 80 Mg Tabs (Furosemide) .[name]... Take 1 tablet by mouth once a day    Hydralazine Hcl 25 Mg Tabs (Hydralazine hcl) .[name]... Take 1 tablet by mouth three times a day    Metoprolol Succinate 100 Mg Xr24h-tab (Metoprolol succinate) .[name]... Take 1 tablet by mouth two times a day    Nitrostat 0.4 Mg Subl (Nitroglycerin) .[name]... As needed    Isosorbide Mononitrate Cr 30 Mg Xr24h-tab (Isosorbide mononitrate) .[name]... Take one-half  tablet by mouth daily    Spironolactone 25 Mg Tabs (Spironolactone) .[name]... Take 1 tablet by mouth once a day  Complete Medication List: 1)  Warfarin Sodium 5 Mg Tabs (Warfarin sodium) .... Take as directed 2)  Aspirin 81 Mg Tbec (Aspirin) .... One by mouth every day 3)  Furosemide 80 Mg Tabs (Furosemide) .... Take 1 tablet by mouth once a day 4)  Hydralazine Hcl 25 Mg Tabs (Hydralazine hcl) .... Take 1 tablet by mouth three times a day 5)  Klor-con M20 20 Meq Cr-tabs (Potassium chloride crys cr) .... Take 2 tablets am 6)  Metoprolol Succinate 100 Mg Xr24h-tab (Metoprolol succinate) .... Take 1 tablet by mouth two times a day 7)  Nitrostat 0.4 Mg Subl (Nitroglycerin) .... As needed 8)  Lanoxin 0.125 Mg Tabs (Digoxin) .[name].. 1 tab daily 9)  Glyburide 1.25 Mg Tabs (Glyburide) .... Take 1 tablet by mouth once a day with breakfast 10)  Freestyle Lite Test Strp (Glucose blood) .... Check blood sugar daily  dx 250.00 11)  Isosorbide Mononitrate Cr 30 Mg Xr24h-tab (Isosorbide mononitrate) .... Take one-half  tablet by mouth daily 12)   Spironolactone 25 Mg Tabs (Spironolactone) .... Take 1 tablet by mouth once a day  Patient Instructions: 1)  4-6 weeks Prescriptions: SPIRONOLACTONE 25 MG TABS (SPIRONOLACTONE) Take 1 tablet by mouth once a day  #30 x 11   Entered and Authorized by:   Birdie Sons MD   Signed by:   Birdie Sons MD on 08/19/2010   Method used:   Electronically to        [facility] [id number] (retail)       [address] [place], [place]  [address]       Ph: [phone]       Fax: [phone]   RxID:   [id number]    Orders Added: 1)  Est. Patient Level III [84132]

## 2010-10-22 NOTE — Medication Information (Signed)
Summary: Order for Diabetes Testing Supplies/[facility]  Order for Diabetes Testing Supplies/[facility]   Imported By: Maryln Gottron 10/08/2009 12:49:53  _____________________________________________________________________  External Attachment:    Type:   Image     Comment:   External Document

## 2010-10-22 NOTE — Letter (Signed)
Summary: Patient medication list  Patient medication list   Imported By: Kassie Mends 11/05/2009 11:46:32  _____________________________________________________________________  External Attachment:    Type:   Image     Comment:   External Document

## 2010-10-22 NOTE — Assessment & Plan Note (Signed)
Summary: [facility]   Primary Provider:  Birdie Sons MD  CC:  pacer check.  History of Present Illness:  [name] is seen in followup for congestive heart failure in the setting of atrial fibrillation with a rapid ventricular response and associated cardiomyopathy with both ischemic and nonischemic components. He is status post CRT implantation with augmented rate control and he comes in today feeling considerably better than he has. There has been no edema and shortness of breath his last overall sense of fatigue is better  He underwent cath in OCT 2010  1. Nonischemic cardiomyopathy with reduced cardiac output and mild       elevated pulmonary pressures.   2. Continued patency of the left anterior descending artery at site of previous stenting  3. Scattered noncritical disease as expected with the patient's       underlying diabetes, but with left ventricular function out of       proportion to the extent of coronary disease.  He has been doing pretty well. He continues to be much more active than prior to device implantation. He is trying to assess objectively his functional status. He is unable to do it. He is swimming daily   he is able to climb stairs and go shopping with his wife. There are no things that he would like to do that he cannot do although her things that he would like to do faster  Current Medications (verified): 1)  Warfarin Sodium 5 Mg Tabs (Warfarin Sodium) .... 5 Mg M-W-F, 2.5 Mg Other Days 2)  Aspirin 81 Mg  Tbec (Aspirin) .... One By Mouth Every Day 3)  Furosemide 80 Mg Tabs (Furosemide) .[name].. 1  Tab Once Daily 4)  Hydralazine Hcl 25 Mg Tabs (Hydralazine Hcl) .... Take 1 Tablet By Mouth Three Times A Day 5)  Klor-Con M20 20 Meq Cr-Tabs (Potassium Chloride Crys Cr) .... Take 2 Tablets Am 6)  Senna S 8.6-50 Mg Tabs (Sennosides-Docusate Sodium) .... One By Mouth Daily 7)  Omeprazole 20 Mg Cpdr (Omeprazole) .... One By Mouth Daily 8)  Toprol Xl 100 Mg  Xr24h-Tab (Metoprolol Succinate) .... Take One Tablet By Mouth Twice A Day 9)  Nitrostat 0.4 Mg Subl (Nitroglycerin) .... As Needed 10)  Digoxin 0.125 Mg Tabs (Digoxin) .... Take A Half  Tablet By Mouth Daily 11)  Glyburide 1.25 Mg Tabs (Glyburide) .... Take 1 Tablet By Mouth Once A Day With Breakfast 12)  Freestyle Lite Test  Strp (Glucose Blood) .... Check Blood Sugar Daily  Dx 250.00 13)  Nitrostat 0.4 Mg Subl (Nitroglycerin) .... As Directed 14)  Januvia 100 Mg Tabs (Sitagliptin Phosphate) .... Take 1 Tab By Mouth Daily  Allergies (verified): 1)  ! * Betablockers ?  Past History:  Past Medical History: Last updated: 08/14/2009 Current Problems:  Implantation of AICD- [employer] CORONARY ARTERY DISEASE (ICD-414.00)-lad stenting nonischemic cardiomyopathy COUMADIN THERAPY (ICD-V58.61) ATRIAL FIBRILLATION (ICD-427.31) CONGESTIVE HEART FAILURE-chronic systolic HYPERLIPIDEMIA (ICD-272.4) HYPERTENSION (ICD-401.9) DIABETES MELLITUS, TYPE II (ICD-250.00) RENAL DISEASE, CHRONIC, STAGE III (ICD-585.3) ENCOUNTER FOR LONG-TERM USE OF OTHER MEDICATIONS (ICD-V58.69) CONSTIPATION (ICD-564.00) TOTAL KNEE REPLACEMENT, HX OF (ICD-V45.89) HYPERKALEMIA (ICD-276.7) SLEEP APNEA (ICD-780.57) SPECIAL SCREENING FOR MALIGNANT NEOPLASMS COLON (ICD-V76.51) HEMATURIA UNSPECIFIED (ICD-599.70) ENCOUNTER FOR THERAPEUTIC DRUG MONITORING (ICD-V58.83) ADVEF, DRUG/MEDICINAL/BIOLOGICAL SUBST NOS (ICD-995.20) PROSTATE CANCER, HX OF (ICD-V10.46) COLON CANCER, HX OF (ICD-V10.05) OSTEOARTHRITIS (ICD-715.90) SKIN CANCER, HX OF (ICD-V10.83)  Past Surgical History: Last updated: 08/14/2009 Colectomy Prostatectomy 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  Family History: Last updated: 04/18/2009 Positive for coronary artery disease on his father, otherwise noncontributory.  Social History: Last  updated: 04/18/2009  The patient lives here in [place] with his wife.  He   is retired from [employer].  He quit smoking 15 years ago.  Denies any   recreational or alcohol use.  Tries to follow a heart-healthy diet.  For   exercise, he is actually participating in rehab post knee surgery 2 days   a week and then also does some exercises in range of motion on his own.      Vital Signs:  Patient profile:   77 year old male Height:      72 inches Weight:      197 pounds BMI:     26.81 Pulse rate:   78 / minute Pulse rhythm:   regular BP sitting:   196 / 92  (right arm) Cuff size:   regular  Vitals Entered By: Judithe Modest CMA (October 30, 2009 8:53 AM)  Physical Exam  General:  The patient was alert and oriented in no acute distress.Neck veins were flat, carotids were brisk. Lungs were clear. Heart sounds were irregular without murmurs or gallops. Abdomen was soft with active bowel sounds. There is no clubbing cyanosis or edema.     ICD Specifications Following MD:  Sherryl Manges, MD     Referring MD:  [facility] ICD Vendor:  [facility]     ICD Model Number:  [phone]     ICD Serial Number:  [id number] ICD DOI:  07/19/2009     ICD Implanting MD:  Sherryl Manges, MD  Lead 1:    Location: RV     DOI: 07/19/2009     Model #: [id number]     Serial #: [id number]     Status: active Lead 2:    Location: LV     DOI: 07/19/2009     Model #: [id number]     Serial #: [id number]     Status: active  ICD Follow Up Remote Check?  No Charge Time:  8.5 seconds     Battery Est. Longevity:  7 years Underlying rhythm:  A-fib ICD Dependent:  No       ICD Device Measurements Right Ventricle:  Amplitude: 12 mV, Impedance: 560 ohms, Threshold: 0.75 V at 0.4 msec Left Ventricle:  Impedance: 710 ohms, Threshold: 1.25 V at 1.0 msec Configuration: LV TIP TO RV COIL Shock Impedance: 49 ohms   Episodes Coumadin:  Yes Shock:  0     ATP:  0     Nonsustained:  0     Ventricular Pacing:  29%  Brady Parameters Mode VVIR      Lower Rate Limit:  70     Rate Response Parameters:  Threshold Auto +1.0, slope 7  Tachy Zones VF:  240     VT:  200     Next Cardiology Appt Due:  01/20/2010 Tech Comments:  Base rate to 70 and sensor on.  ROV 3 months Dr. Graciela Husbands.  Checked by [employer]. Altha Harm, LPN  October 30, 2009 9:22 AM   Impression & Recommendations:  Problem # 1:  IMPLANTATION OF DEFIBRILLATOR, ST JUDE UNIFY (ICD-V45.02) Assessment Improved Device parameters and data were reviewed;  heart rate histograms suggested inordinate intrinsic conduction at lower heart rates and so we increased his rate from 60-70. In addition we activated rate response. We then tested this and a  variety of maneuvers going from sitting to lying. Ultimately we came to a threshold of auto +1 a slope of 8 and a median reaction time. He is to followup with Dr. Riley Kill in the next number of weeks. We will continue to try and sort out his functional status. In the event that this remains impossible, we might consider CPX testing as a way to objectively assessment his limitations. Given his paucity of symptoms at this point however, I am a little bit reluctant to proceed with AV junction ablation notwithstanding the data that would suggest patient improvement. These data however are limited the patient with class III symptoms and I don't feel that he fits in that group at this time.  Problem # 2:  CARDIOMYOPATHY, PRIMARY, DILATED (ICD-425.4) C. the above  Problem # 3:  CONGESTIVE HEART FAILURE (ICD-428.0) See the above His updated medication list for this problem includes:    Warfarin Sodium 5 Mg Tabs (Warfarin sodium) .Marland KitchenMarland KitchenMarland KitchenMarland Kitchen 5 mg m-w-f, 2.5 mg other days    Aspirin 81 Mg Tbec (Aspirin) ..... One by mouth every day    Furosemide 80 Mg Tabs (Furosemide) .[name]... 1  tab once daily    Toprol Xl 100 Mg Xr24h-tab (Metoprolol succinate) .[name]... Take one tablet by mouth twice a day    Nitrostat 0.4 Mg Subl (Nitroglycerin) .[name]... As needed    Digoxin 0.125 Mg  Tabs (Digoxin) .[name]... Take a half  tablet by mouth daily    Nitrostat 0.4 Mg Subl (Nitroglycerin) .[name]... As directed  Problem # 4:  ATRIAL FIBRILLATION (ICD-427.31) As above; note also that his heart rate response in his atrial fibrillation remains fast. However, he is limited and up titration of his drugs;  his creatinine is 2.3 at his last visit however, his digoxin level was unmeasurable. It might well be appropriate to consider up titration of his digoxin. I will defer this to Dr. Riley Kill. His updated medication list for this problem includes:    Warfarin Sodium 5 Mg Tabs (Warfarin sodium) .Marland KitchenMarland KitchenMarland KitchenMarland Kitchen 5 mg m-w-f, 2.5 mg other days    Aspirin 81 Mg Tbec (Aspirin) ..... One by mouth every day    Toprol Xl 100 Mg Xr24h-tab (Metoprolol succinate) .[name]... Take one tablet by mouth twice a day    Digoxin 0.125 Mg Tabs (Digoxin) .[name]... Take a half  tablet by mouth daily  Patient Instructions: 1)  Your physician recommends that you schedule a follow-up appointment in: 3 MONTHS

## 2010-10-22 NOTE — Assessment & Plan Note (Signed)
Summary: ROV   Visit Type:  Follow-up Primary Provider:  Birdie Sons MD  CC:  Symptoms are improving.  History of Present Illness: Patient thinks he is doing well.  He is pleased with how he is doing.  Patient is swimming at the [employer] without too much difficulty.  He does about thirty five minutes at the end of it, and is not exhausted.  Periodically he takes his BP, and it is always in the 135-145 range.   No chest pain.  Has had recent headaches which have improved, but he did not tolerate nitrates very well when they were first used.    Current Medications (verified): 1)  Warfarin Sodium 5 Mg Tabs (Warfarin Sodium) .... 5 Mg M-W-F, 2.5 Mg Other Days 2)  Aspirin 81 Mg  Tbec (Aspirin) .... One By Mouth Every Day 3)  Furosemide 80 Mg Tabs (Furosemide) .[name].. 1  Tab Once Daily 4)  Hydralazine Hcl 25 Mg Tabs (Hydralazine Hcl) .... Take 1 Tablet By Mouth Three Times A Day 5)  Klor-Con M20 20 Meq Cr-Tabs (Potassium Chloride Crys Cr) .... Take 2 Tablets Am 6)  Toprol Xl 100 Mg Xr24h-Tab (Metoprolol Succinate) .... Take One Tablet By Mouth Twice A Day 7)  Nitrostat 0.4 Mg Subl (Nitroglycerin) .... As Needed 8)  Digoxin 0.125 Mg Tabs (Digoxin) .... Take A One Tablet By Mouth Daily 9)  Glyburide 1.25 Mg Tabs (Glyburide) .... Take 1 Tablet By Mouth Once A Day With Breakfast 10)  Freestyle Lite Test  Strp (Glucose Blood) .... Check Blood Sugar Daily  Dx 250.00 11)  Januvia 100 Mg Tabs (Sitagliptin Phosphate) .... Take 1 Tab By Mouth Daily  Allergies: 1)  ! * Betablockers ?  Vital Signs:  Patient profile:   77 year old male Height:      72 inches Weight:      196 pounds BMI:     26.68 Pulse rate:   72 / minute Resp:     18 per minute BP sitting:   154 / 90  (left arm)  Vitals Entered By: Vikki Ports (February 21, 2010 9:24 AM)  Physical Exam  General:  Well developed, well nourished, in no acute distress. Head:  normocephalic and atraumatic Eyes:  PERRLA/EOM intact; conjunctiva and  lids normal. Neck:  Neck veins flat. Lungs:  Clear bilaterally to auscultation and percussion. Heart:  PMI slightly displaced.  Normal S1 and S2.  No sig apical murmur.   Abdomen:  Bowel sounds positive; abdomen soft and non-tender without masses, organomegaly, or hernias noted. No hepatosplenomegaly. Extremities:  no significant edema. Neurologic:  Alert and oriented x 3.    ICD Specifications Following MD:  Sherryl Manges, MD     Referring MD:  [facility] ICD Vendor:  [facility]     ICD Model Number:  [id number]     ICD Serial Number:  [id number] ICD DOI:  07/19/2009     ICD Implanting MD:  Sherryl Manges, MD  Lead 1:    Location: RV     DOI: 07/19/2009     Model #: [id number]     Serial #: [id number]     Status: active Lead 2:    Location: LV     DOI: 07/19/2009     Model #: [id number]     Serial #: [id number]     Status: active  ICD Follow Up ICD Dependent:  No       ICD Device Measurements Configuration: LV TIP TO RV COIL  Episodes Coumadin:  Yes  Brady Parameters Mode VVIR     Lower Rate Limit:  70     Upper Rate Limit 120 Rate Response Parameters:  Threshold Auto +1.0, slope 7  Tachy Zones VF:  240     VT:  200     Impression & Recommendations:  Problem # 1:  CARDIOMYOPATHY, PRIMARY, DILATED (ICD-425.4)  EF is improved.  Per Dr. Graciela Husbands, will add nitrates to hydralazine, althouth tolerated poorly in past.  Therefore, start low and go slow with 15 mg of isosorbide dinitrate.   His updated medication list for this problem includes:    Warfarin Sodium 5 Mg Tabs (Warfarin sodium) .Marland KitchenMarland KitchenMarland KitchenMarland Kitchen 5 mg m-w-f, 2.5 mg other days    Aspirin 81 Mg Tbec (Aspirin) ..... One by mouth every day    Furosemide 80 Mg Tabs (Furosemide) .[name]... 1  tab once daily    Toprol Xl 100 Mg Xr24h-tab (Metoprolol succinate) .[name]... Take one tablet by mouth twice a day    Nitrostat 0.4 Mg Subl (Nitroglycerin) .[name]... As needed    Digoxin 0.125 Mg Tabs (Digoxin) .[name]... Take a one tablet by mouth daily    Isosorbide Mononitrate Cr 30 Mg Xr24h-tab  (Isosorbide mononitrate) .[name]... Take one-half  tablet by mouth daily  Orders: EKG w/ Interpretation (93000)  Problem # 2:  ATRIAL FIBRILLATION (ICD-427.31)  rate controlled, with v pacing. His updated medication list for this problem includes:    Warfarin Sodium 5 Mg Tabs (Warfarin sodium) .Marland KitchenMarland KitchenMarland KitchenMarland Kitchen 5 mg m-w-f, 2.5 mg other days    Aspirin 81 Mg Tbec (Aspirin) ..... One by mouth every day    Toprol Xl 100 Mg Xr24h-tab (Metoprolol succinate) .[name]... Take one tablet by mouth twice a day    Digoxin 0.125 Mg Tabs (Digoxin) .[name]... Take a one tablet by mouth daily  His updated medication list for this problem includes:    Warfarin Sodium 5 Mg Tabs (Warfarin sodium) .Marland KitchenMarland KitchenMarland KitchenMarland Kitchen 5 mg m-w-f, 2.5 mg other days    Aspirin 81 Mg Tbec (Aspirin) ..... One by mouth every day    Toprol Xl 100 Mg Xr24h-tab (Metoprolol succinate) .[name]... Take one tablet by mouth twice a day    Digoxin 0.125 Mg Tabs (Digoxin) .[name]... Take a one tablet by mouth daily  Orders: EKG w/ Interpretation (93000)  Problem # 3:  CORONARY ARTERY DISEASE (ICD-414.00)  stable His updated medication list for this problem includes:    Warfarin Sodium 5 Mg Tabs (Warfarin sodium) .Marland KitchenMarland KitchenMarland KitchenMarland Kitchen 5 mg m-w-f, 2.5 mg other days    Aspirin 81 Mg Tbec (Aspirin) ..... One by mouth every day    Toprol Xl 100 Mg Xr24h-tab (Metoprolol succinate) .[name]... Take one tablet by mouth twice a day    Nitrostat 0.4 Mg Subl (Nitroglycerin) .[name]... As needed    Isosorbide Mononitrate Cr 30 Mg Xr24h-tab (Isosorbide mononitrate) .[name]... Take one-half  tablet by mouth daily  Orders: EKG w/ Interpretation (93000)  Problem # 4:  HYPERLIPIDEMIA (ICD-272.4) currently on no therapy.  Will defer to Dr. Cato Mulligan.    Patient Instructions: 1)  Your physician recommends that you schedule a follow-up appointment in: 3 MONTHS 2)  Your physician has recommended you make the following change in your medication: START Isosorbide MN 30mg  one-half tablet daily,  discontinue medication is headache  worsens 3)  Please have follow-up labs with Dr Cato Mulligan.  Prescriptions: ISOSORBIDE MONONITRATE CR 30 MG XR24H-TAB (ISOSORBIDE MONONITRATE) Take one-half  tablet by mouth daily  #90 x 3   Entered by:  Julieta Gutting, RN, BSN   Authorized by:   Ronaldo Miyamoto, MD, [facility]   Signed by:   Julieta Gutting, RN, BSN on 02/21/2010   Method used:   Electronically to        [facility] [phone]* (retail)       [address] [place], [place]  [address]       Ph: [phone]       Fax: [phone]   RxID:   [id number]

## 2010-10-22 NOTE — Assessment & Plan Note (Signed)
Summary: protime / digoxin 427.31 //ccm  Nurse Visit   Allergies: 1)  ! * Betablockers ? Laboratory Results   Blood Tests     PT: 21.4 s   (Normal Range: 10.6-13.4)  INR: 3.1   (Normal Range: 0.88-1.12   Therap INR: 2.0-3.5) Comments: Rita Ohara  December 05, 2009 10:03 AM     Orders Added: 1)  TLB-Digoxin (Lanoxin) [80162-DIG] 2)  Venipuncture [95621] 3)  Protime [85610QW] 4)  Fingerstick [30865]   ANTICOAGULATION RECORD PREVIOUS REGIMEN & LAB RESULTS Anticoagulation Diagnosis:  Atrial Fibrillation on  07/16/2009 Previous INR Goal Range:  2.0-3.0 on  02/24/2008 Previous INR:  2.8 on  11/07/2009 Previous Coumadin Dose(mg):  5mg  on  07/25/2009 Previous Regimen:  same on  11/07/2009 Previous Coagulation Comments:  OV on  06/13/2008  NEW REGIMEN & LAB RESULTS Current INR: 3.1 Regimen: same  Repeat testing in: 1 month  Anticoagulation Visit Questionnaire Coumadin dose missed/changed:  No Abnormal Bleeding Symptoms:  No  Any diet changes including alcohol intake, vegetables or greens since the last visit:  No Any illnesses or hospitalizations since the last visit:  No Any signs of clotting since the last visit (including chest discomfort, dizziness, shortness of breath, arm tingling, slurred speech, swelling or redness in leg):  No  MEDICATIONS WARFARIN SODIUM 5 MG TABS (WARFARIN SODIUM) 5 mg M-W-F, 2.5 mg other days ASPIRIN 81 MG  TBEC (ASPIRIN) one by mouth every day FUROSEMIDE 80 MG TABS (FUROSEMIDE) 1  tab once daily HYDRALAZINE HCL 25 MG TABS (HYDRALAZINE HCL) Take 1 tablet by mouth three times a day KLOR-CON M20 20 MEQ CR-TABS (POTASSIUM CHLORIDE CRYS CR) take 2 tablets AM SENNA S 8.6-50 MG TABS (SENNOSIDES-DOCUSATE SODIUM) one by mouth daily OMEPRAZOLE 20 MG CPDR (OMEPRAZOLE) one by mouth daily TOPROL XL 100 MG XR24H-TAB (METOPROLOL SUCCINATE) take one tablet by mouth twice a day NITROSTAT 0.4 MG SUBL (NITROGLYCERIN) as needed DIGOXIN 0.125 MG TABS  (DIGOXIN) Take a one tablet by mouth daily GLYBURIDE 1.25 MG TABS (GLYBURIDE) Take 1 tablet by mouth once a day with breakfast FREESTYLE LITE TEST  STRP (GLUCOSE BLOOD) check blood sugar daily  DX 250.00 JANUVIA 100 MG TABS (SITAGLIPTIN PHOSPHATE) Take 1 tab by mouth daily

## 2010-10-22 NOTE — Progress Notes (Signed)
Summary: Question about  pacer phone checks  Phone Note Call from Patient Call back at Home Phone [phone]   Caller: Patient Summary of Call: Question about pacemaker check by phone Initial call taken by: Judie Grieve,  March 26, 2010 11:31 AM  Follow-up for Phone Call        Patient does not have land line and will be seen in office only, no Merlin.   Follow-up by: Altha Harm, LPN,  March 26, 8840 1:44 PM

## 2010-10-22 NOTE — Assessment & Plan Note (Signed)
Summary: PT/NJR/cardiology rescd//ccm  Nurse Visit   Allergies: No Known Drug Allergies Laboratory Results   Blood Tests   Date/Time Received: June 25, 2010 12:17 PM  Date/Time Reported: June 25, 2010 12:17 PM    INR: 2.2   (Normal Range: 0.88-1.12   Therap INR: 2.0-3.5) Comments: Wynona Canes, CMA  June 25, 2010 12:17 PM     Orders Added: 1)  Est. Patient Level I [99211] 2)  Protime [16109UE]  Laboratory Results   Blood Tests      INR: 2.2   (Normal Range: 0.88-1.12   Therap INR: 2.0-3.5) Comments: Wynona Canes, CMA  June 25, 2010 12:17 PM       ANTICOAGULATION RECORD PREVIOUS REGIMEN & LAB RESULTS Anticoagulation Diagnosis:  Atrial Fibrillation on  07/16/2009 Previous INR Goal Range:  2.0-3.0 on  02/24/2008 Previous INR:  1.8 on  05/07/2010 Previous Coumadin Dose(mg):  5mg  on  06/04/2010 Previous Regimen:  same on  03/12/2010 Previous Coagulation Comments:  OV on  06/13/2008  NEW REGIMEN & LAB RESULTS Current INR: 2.2 Current Coumadin Dose(mg): 5mg  on mon & fri then 2.5mg  on other days Regimen: same  (no change)       Repeat testing in: 4 weeks MEDICATIONS WARFARIN SODIUM 5 MG TABS (WARFARIN SODIUM) 5 mg M-W-F, 2.5 mg other days ASPIRIN 81 MG  TBEC (ASPIRIN) one by mouth every day FUROSEMIDE 80 MG TABS (FUROSEMIDE) 1  tab once daily HYDRALAZINE HCL 25 MG TABS (HYDRALAZINE HCL) Take 1 tablet by mouth three times a day KLOR-CON M20 20 MEQ CR-TABS (POTASSIUM CHLORIDE CRYS CR) take 2 tablets AM TOPROL XL 100 MG XR24H-TAB (METOPROLOL SUCCINATE) take one tablet by mouth twice a day NITROSTAT 0.4 MG SUBL (NITROGLYCERIN) as needed DIGOXIN 0.125 MG TABS (DIGOXIN) Take a one tablet by mouth daily GLYBURIDE 1.25 MG TABS (GLYBURIDE) Take 1 tablet by mouth once a day with breakfast FREESTYLE LITE TEST  STRP (GLUCOSE BLOOD) check blood sugar daily  DX 250.00 JANUVIA 100 MG TABS (SITAGLIPTIN PHOSPHATE) Take 1 tab by mouth daily ISOSORBIDE  MONONITRATE CR 30 MG XR24H-TAB (ISOSORBIDE MONONITRATE) Take one-half  tablet by mouth daily   Anticoagulation Visit Questionnaire      Coumadin dose missed/changed:  No      Abnormal Bleeding Symptoms:  No   Any diet changes including alcohol intake, vegetables or greens since the last visit:  No Any illnesses or hospitalizations since the last visit:  No Any signs of clotting since the last visit (including chest discomfort, dizziness, shortness of breath, arm tingling, slurred speech, swelling or redness in leg):  No

## 2010-10-22 NOTE — Miscellaneous (Signed)
Clinical Lists Changes  Observations: Added new observation of ECHOINTERP:   Study Conclusions    - Left ventricle: The cavity size was normal. Wall thickness was     increased in a pattern of mild LVH. Systolic function was     moderately to severely reduced. The estimated ejection fraction     was in the range of 30% to 35%. Diffuse hypokinesis.   - Aortic valve: Mild regurgitation. Valve area: 2.13cm^2(VTI). Valve     area: 2.3cm^2 (Vmax).   - Mitral valve: Mild regurgitation.   - Left atrium: The atrium was moderately dilated.   - Right atrium: The atrium was moderately dilated.   - Pulmonary arteries: Systolic pressure was mildly increased.   Prepared and Electronically Authenticated by    Olga Millers, MD, [facility]  (01/09/2010 10:46) Added new observation of CT OF HEAD:  Findings:  There is no evidence of brain mass, brain hemorrhage, or   acute infarction.    The patient has had removal of the left globe  with a left orbital   globe prosthesis currently in place. Minimal basal ganglia   calcification is seen bilaterally.    The ventricular system is normal size and shape.  There is no   evidence of shift of midline structures, parenchymal lesion, or   subdural or epidural hematoma.    The calvarium is intact.  Mastoids are well aerated.  No sinusitis   is evident.    IMPRESSION:   There is no evidence of brain mass, brain hemorrhage, or acute   infarction.    There is a left orbital prosthesis in place.  Minimal bilateral   basal ganglia calcification is present.    No acute or active intracerebral process is seen.  No skull lesion   is evident.  No sinusitis is evident. Density projects in the   external ear canal on each side.  This may reflect the angle or may   reflect cerumen but please correlate with physical examination.    Read By:  Crawford Givens,  M.D. (12/27/2009 10:47)      Echocardiogram  Procedure date:  01/09/2010  Findings:        Study  Conclusions    - Left ventricle: The cavity size was normal. Wall thickness was     increased in a pattern of mild LVH. Systolic function was     moderately to severely reduced. The estimated ejection fraction     was in the range of 30% to 35%. Diffuse hypokinesis.   - Aortic valve: Mild regurgitation. Valve area: 2.13cm^2(VTI). Valve     area: 2.3cm^2 (Vmax).   - Mitral valve: Mild regurgitation.   - Left atrium: The atrium was moderately dilated.   - Right atrium: The atrium was moderately dilated.   - Pulmonary arteries: Systolic pressure was mildly increased.   Prepared and Electronically Authenticated by    Olga Millers, MD, [facility]   CT Brain  Procedure date:  12/27/2009  Findings:       Findings:  There is no evidence of brain mass, brain hemorrhage, or   acute infarction.    The patient has had removal of the left globe  with a left orbital   globe prosthesis currently in place. Minimal basal ganglia   calcification is seen bilaterally.    The ventricular system is normal size and shape.  There is no   evidence of shift of midline structures, parenchymal lesion, or   subdural or epidural hematoma.  The calvarium is intact.  Mastoids are well aerated.  No sinusitis   is evident.    IMPRESSION:   There is no evidence of brain mass, brain hemorrhage, or acute   infarction.    There is a left orbital prosthesis in place.  Minimal bilateral   basal ganglia calcification is present.    No acute or active intracerebral process is seen.  No skull lesion   is evident.  No sinusitis is evident. Density projects in the   external ear canal on each side.  This may reflect the angle or may   reflect cerumen but please correlate with physical examination.    Read By:  Crawford Givens,  M.D.

## 2010-10-22 NOTE — Assessment & Plan Note (Signed)
Summary: 3wk rov/pt/njr   Vital Signs:  Patient profile:   77 year old male Weight:      194 pounds Temp:     98 degrees F Pulse rate:   66 / minute Pulse rhythm:   irregular Resp:     12 per minute BP sitting:   152 / 76  (left arm)  Vitals Entered By: Gladis Riffle, RN (September 26, 2009 11:33 AM)   Primary Care Provider:  Birdie Sons MD   History of Present Illness:  Follow-Up Visit      This is a 77 year old man who presents for Follow-up visit.  The patient denies chest pain, palpitations, dizziness, syncope, edema, SOB, DOE, PND, and orthopnea.  Since the last visit the patient notes no new problems or concerns and being seen by a specialist.  The patient reports taking meds as prescribed.  When questioned about possible medication side effects, the patient notes none.   reviewed dr Odessa Fleming note  Preventive Screening-Counseling & Management  Alcohol-Tobacco     Smoking Status: quit > 6 months     Year Started: 1950     Year Quit: 1964  Current Problems (verified): 1)  Implantation of Defibrillator, St Jude Unify  (ICD-V45.02) 2)  Cardiomyopathy, Primary, Dilated  (ICD-425.4) 3)  Coronary Artery Disease  (ICD-414.00) 4)  Coumadin Therapy  (ICD-V58.61) 5)  Atrial Fibrillation  (ICD-427.31) 6)  Congestive Heart Failure  (ICD-428.0) 7)  Hyperlipidemia  (ICD-272.4) 8)  Hypertension  (ICD-401.9) 9)  Diabetes Mellitus, Type II  (ICD-250.00) 10)  Renal Disease, Chronic, Stage Iii  (ICD-585.3) 11)  Encounter For Long-term Use of Other Medications  (ICD-V58.69) 12)  Total Knee Replacement, Hx of  (ICD-V45.89) 13)  Sleep Apnea  (ICD-780.57) 14)  Special Screening For Malignant Neoplasms Colon  (ICD-V76.51) 15)  Hematuria Unspecified  (ICD-599.70) 16)  Encounter For Therapeutic Drug Monitoring  (ICD-V58.83) 17)  Advef, Drug/medicinal/biological Subst Nos  (ICD-995.20) 18)  Prostate Cancer, Hx of  (ICD-V10.46) 19)  Colon Cancer, Hx of  (ICD-V10.05) 20)  Osteoarthritis   (ICD-715.90) 21)  Skin Cancer, Hx of  (ICD-V10.83)  Current Medications (verified): 1)  Warfarin Sodium 5 Mg Tabs (Warfarin Sodium) .... 5 Mg M-W-F, 2.5 Mg Other Days 2)  Aspirin 81 Mg  Tbec (Aspirin) .... One By Mouth Every Day 3)  Furosemide 80 Mg Tabs (Furosemide) .[name].. 1  Tab Once Daily 4)  Hydralazine Hcl 25 Mg Tabs (Hydralazine Hcl) .... Take 1 Tablet By Mouth Three Times A Day 5)  Klor-Con M20 20 Meq Cr-Tabs (Potassium Chloride Crys Cr) .... Take 2 Tablets Am 6)  Senna S 8.6-50 Mg Tabs (Sennosides-Docusate Sodium) .... One By Mouth Daily 7)  Omeprazole 20 Mg Cpdr (Omeprazole) .... One By Mouth Daily 8)  Toprol Xl 100 Mg Xr24h-Tab (Metoprolol Succinate) .... Take One Tablet By Mouth Twice A Day 9)  Nitrostat 0.4 Mg Subl (Nitroglycerin) .... As Needed 10)  Digoxin 0.125 Mg Tabs (Digoxin) .... Take A Half  Tablet By Mouth Daily 11)  Glyburide 1.25 Mg Tabs (Glyburide) .... Take 1 Tablet By Mouth Once A Day With Breakfast 12)  Freestyle Lite Test  Strp (Glucose Blood) .... Check Blood Sugar Daily  Dx 250.00 13)  Nitrostat 0.4 Mg Subl (Nitroglycerin) .... As Directed 14)  Januvia 100 Mg Tabs (Sitagliptin Phosphate) .... Take 1 Tab By Mouth Daily  Allergies: 1)  ! * Betablockers ?  Comments:  Nurse/Medical Assistant: 3 week rov--states being considered for pacemaker  The patient's  medications and allergies were reviewed with the patient and were updated in the Medication and Allergy Lists. Gladis Riffle, RN (September 26, 2009 11:34 AM)  Past History:  Past Medical History: Last updated: 08/14/2009 Current Problems:  Implantation of AICD- St[employer] Jude Unify CORONARY ARTERY DISEASE (ICD-414.00)-lad stenting nonischemic cardiomyopathy COUMADIN THERAPY (ICD-V58.61) ATRIAL FIBRILLATION (ICD-427.31) CONGESTIVE HEART FAILURE-chronic systolic HYPERLIPIDEMIA (ICD-272.4) HYPERTENSION (ICD-401.9) DIABETES MELLITUS, TYPE II (ICD-250.00) RENAL DISEASE, CHRONIC, STAGE III (ICD-585.3) ENCOUNTER  FOR LONG-TERM USE OF OTHER MEDICATIONS (ICD-V58.69) CONSTIPATION (ICD-564.00) TOTAL KNEE REPLACEMENT, HX OF (ICD-V45.89) HYPERKALEMIA (ICD-276.7) SLEEP APNEA (ICD-780.57) SPECIAL SCREENING FOR MALIGNANT NEOPLASMS COLON (ICD-V76.51) HEMATURIA UNSPECIFIED (ICD-599.70) ENCOUNTER FOR THERAPEUTIC DRUG MONITORING (ICD-V58.83) ADVEF, DRUG/MEDICINAL/BIOLOGICAL SUBST NOS (ICD-995.20) PROSTATE CANCER, HX OF (ICD-V10.46) COLON CANCER, HX OF (ICD-V10.05) OSTEOARTHRITIS (ICD-715.90) SKIN CANCER, HX OF (ICD-V10.83)  Past Surgical History: Last updated: 08/14/2009 Colectomy Prostatectomy 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  Jude Unify  Family History: Last updated: 04/18/2009 Positive for coronary artery disease on his father, otherwise noncontributory.  Social History: Last updated: 04/18/2009  The patient lives here in [place] with his wife.  He   is retired from [employer].  He quit smoking 15 years ago.  Denies any   recreational or alcohol use.  Tries to follow a heart-healthy diet.  For   exercise, he is actually participating in rehab post knee surgery 2 days   a week and then also does some exercises in range of motion on his own.      Risk Factors: Exercise: yes (07/13/2006)  Risk Factors: Smoking Status: quit > 6 months (09/26/2009)  Review of Systems       All other systems reviewed and were negative   Physical Exam  General:  alert and well-developed.   Head:  normocephalic and atraumatic.   Eyes:  pupils equal and pupils round.   Ears:  R ear normal and L ear normal.   Neck:  supple.  full ROM.   Chest Wall:  no deformities.   Lungs:  normal respiratory effort and no intercostal retractions.   Heart:  normal rate and regular rhythm.   Abdomen:  soft, non-tender, and normal bowel sounds.   overweight Msk:  No deformity or scoliosis noted of thoracic or lumbar spine.    Neurologic:  cranial nerves II-XII intact and gait normal.     Impression & Recommendations:  Problem # 1:  DIABETES MELLITUS, TYPE II (ICD-250.00) not as well controlled will try Januvia---discussed possibility of insulin His updated medication list for this problem includes:    Aspirin 81 Mg Tbec (Aspirin) ..... One by mouth every day    Glyburide 1.25 Mg Tabs (Glyburide) .[name]... Take 1 tablet by mouth once a day with breakfast    Januvia 100 Mg Tabs (Sitagliptin phosphate) .[name]... Take 1 tab by mouth daily  Labs Reviewed: Creat: 2.1 (08/28/2009)     Last Eye Exam: normal-pt's report (09/22/2008) Reviewed HgBA1c results: 8.6 (08/28/2009)  6.3 (03/06/2009)  Problem # 2:  CORONARY ARTERY DISEASE (ICD-414.00) asymptomatic His updated medication list for this problem includes:    Aspirin 81 Mg Tbec (Aspirin) ..... One by mouth every day    Furosemide 80 Mg Tabs (Furosemide) .[name]... 1  tab once daily    Hydralazine Hcl 25 Mg Tabs (Hydralazine hcl) .[name]... Take 1 tablet by mouth three times a day    Toprol Xl 100 Mg Xr24h-tab (Metoprolol succinate) .[name]... Take one tablet by mouth twice a day  Nitrostat 0.4 Mg Subl (Nitroglycerin) .[name]... As needed    Nitrostat 0.4 Mg Subl (Nitroglycerin) .[name]... As directed  Problem # 3:  CONGESTIVE HEART FAILURE (ICD-428.0) symptomaticlly doing well trying to decide if AV nodal ablation would offerany significant improvement His updated medication list for this problem includes:    Warfarin Sodium 5 Mg Tabs (Warfarin sodium) .Marland KitchenMarland KitchenMarland KitchenMarland Kitchen 5 mg m-w-f, 2.5 mg other days    Aspirin 81 Mg Tbec (Aspirin) ..... One by mouth every day    Furosemide 80 Mg Tabs (Furosemide) .[name]... 1  tab once daily    Toprol Xl 100 Mg Xr24h-tab (Metoprolol succinate) .[name]... Take one tablet by mouth twice a day    Digoxin 0.125 Mg Tabs (Digoxin) .[name]... Take a half  tablet by mouth daily  Complete Medication List: 1)  Warfarin Sodium 5 Mg Tabs (Warfarin sodium) .... 5 mg m-w-f, 2.5 mg other  days 2)  Aspirin 81 Mg Tbec (Aspirin) .... One by mouth every day 3)  Furosemide 80 Mg Tabs (Furosemide) .[name].. 1  tab once daily 4)  Hydralazine Hcl 25 Mg Tabs (Hydralazine hcl) .... Take 1 tablet by mouth three times a day 5)  Klor-con M20 20 Meq Cr-tabs (Potassium chloride crys cr) .... Take 2 tablets am 6)  Senna S 8.6-50 Mg Tabs (Sennosides-docusate sodium) .... One by mouth daily 7)  Omeprazole 20 Mg Cpdr (Omeprazole) .... One by mouth daily 8)  Toprol Xl 100 Mg Xr24h-tab (Metoprolol succinate) .... Take one tablet by mouth twice a day 9)  Nitrostat 0.4 Mg Subl (Nitroglycerin) .... As needed 10)  Digoxin 0.125 Mg Tabs (Digoxin) .... Take a half  tablet by mouth daily 11)  Glyburide 1.25 Mg Tabs (Glyburide) .... Take 1 tablet by mouth once a day with breakfast 12)  Freestyle Lite Test Strp (Glucose blood) .... Check blood sugar daily  dx 250.00 13)  Nitrostat 0.4 Mg Subl (Nitroglycerin) .... As directed 14)  Januvia 100 Mg Tabs (Sitagliptin phosphate) .... Take 1 tab by mouth daily  Other Orders: Protime ([id number])  Patient Instructions: 1)  Please schedule a follow-up appointment in 1 month. Prescriptions: JANUVIA 100 MG TABS (SITAGLIPTIN PHOSPHATE) Take 1 tab by mouth daily  #45 x 3   Entered and Authorized by:   Birdie Sons MD   Signed by:   Birdie Sons MD on 09/27/2009   Method used:   Samples Given   RxID:   [id number]    ANTICOAGULATION RECORD PREVIOUS REGIMEN & LAB RESULTS Anticoagulation Diagnosis:  Atrial Fibrillation on  07/16/2009 Previous INR Goal Range:  2.0-3.0 on  02/24/2008 Previous INR:  2.9 ratio on  08/28/2009 Previous Coumadin Dose(mg):  5mg  on  07/25/2009 Previous Regimen:  same on  06/08/2009 Previous Coagulation Comments:  OV on  06/13/2008  NEW REGIMEN & LAB RESULTS Current INR: 2.0 Regimen: same  (no change)   Anticoagulation Visit Questionnaire Coumadin dose missed/changed:  No Abnormal Bleeding Symptoms:  No  Any diet changes  including alcohol intake, vegetables or greens since the last visit:  No Any illnesses or hospitalizations since the last visit:  No Any signs of clotting since the last visit (including chest discomfort, dizziness, shortness of breath, arm tingling, slurred speech, swelling or redness in leg):  No  MEDICATIONS WARFARIN SODIUM 5 MG TABS (WARFARIN SODIUM) 5 mg M-W-F, 2.5 mg other days ASPIRIN 81 MG  TBEC (ASPIRIN) one by mouth every day FUROSEMIDE 80 MG TABS (FUROSEMIDE) 1  tab once daily HYDRALAZINE HCL 25 MG TABS (HYDRALAZINE HCL) Take  1 tablet by mouth three times a day KLOR-CON M20 20 MEQ CR-TABS (POTASSIUM CHLORIDE CRYS CR) take 2 tablets AM SENNA S 8.6-50 MG TABS (SENNOSIDES-DOCUSATE SODIUM) one by mouth daily OMEPRAZOLE 20 MG CPDR (OMEPRAZOLE) one by mouth daily TOPROL XL 100 MG XR24H-TAB (METOPROLOL SUCCINATE) take one tablet by mouth twice a day NITROSTAT 0.4 MG SUBL (NITROGLYCERIN) as needed DIGOXIN 0.125 MG TABS (DIGOXIN) Take a half  tablet by mouth daily GLYBURIDE 1.25 MG TABS (GLYBURIDE) Take 1 tablet by mouth once a day with breakfast FREESTYLE LITE TEST  STRP (GLUCOSE BLOOD) check blood sugar daily  DX 250.00 NITROSTAT 0.4 MG SUBL (NITROGLYCERIN) as directed JANUVIA 100 MG TABS (SITAGLIPTIN PHOSPHATE) Take 1 tab by mouth daily    Laboratory Results   Blood Tests     PT: 17.3 s   (Normal Range: 10.6-13.4)  INR: 2.0   (Normal Range: 0.88-1.12   Therap INR: 2.0-3.5) Comments: Joanne Chars CMA  September 26, 2009 10:38 AM

## 2010-10-22 NOTE — Miscellaneous (Signed)
Summary: Question of CPAP Replacement/[facility]  Question of CPAP Replacement/[facility]   Imported By: Maryln Gottron 08/06/2010 15:42:07  _____________________________________________________________________  External Attachment:    Type:   Image     Comment:   External Document

## 2010-10-22 NOTE — Assessment & Plan Note (Signed)
Summary: per check out/sf   Primary Provider:  Birdie Sons MD  CC:  follow up/pt feeling pretty good.  [name]  History of Present Illness:  [name] [name] is seen in followup for congestive heart failure in the setting of atrial fibrillation with a rapid ventricular response and associated cardiomyopathy with both ischemic and nonischemic components. He is status post CRT implantation with augmented rate control and he comes in today feeling considerably better than he has. There has been no edema and shortness of breath his last overall sense of fatigue is better  He underwent cath in OCT 2010  1. Nonischemic cardiomyopathy with reduced cardiac output and mild       elevated pulmonary pressures.   2. Continued patency of the left anterior descending artery at site of previous stenting  3. Scattered noncritical disease as expected with the patient's       underlying diabetes, but with left ventricular function out of       proportion to the extent of coronary disease.  most recent echo demonstrates an ejection fraction of 30-35% improved considerably from 15% from September 2010  Current Medications (verified): 1)  Warfarin Sodium 5 Mg Tabs (Warfarin Sodium) .... 5 Mg M-W-F, 2.5 Mg Other Days 2)  Aspirin 81 Mg  Tbec (Aspirin) .... One By Mouth Every Day 3)  Furosemide 80 Mg Tabs (Furosemide) .[name].. 1  Tab Once Daily 4)  Hydralazine Hcl 25 Mg Tabs (Hydralazine Hcl) .... Take 1 Tablet By Mouth Three Times A Day 5)  Klor-Con M20 20 Meq Cr-Tabs (Potassium Chloride Crys Cr) .... Take 2 Tablets Am 6)  Toprol Xl 100 Mg Xr24h-Tab (Metoprolol Succinate) .... Take One Tablet By Mouth Twice A Day 7)  Nitrostat 0.4 Mg Subl (Nitroglycerin) .... As Needed 8)  Digoxin 0.125 Mg Tabs (Digoxin) .... Take A One Tablet By Mouth Daily 9)  Glyburide 1.25 Mg Tabs (Glyburide) .... Take 1 Tablet By Mouth Once A Day With Breakfast 10)  Freestyle Lite Test  Strp (Glucose Blood) .... Check Blood Sugar Daily  Dx 250.00 11)   Januvia 100 Mg Tabs (Sitagliptin Phosphate) .... Take 1 Tab By Mouth Daily  Allergies (verified): 1)  ! * Betablockers ?  Past History:  Past Medical History: Last updated: 08/14/2009 Current Problems:  Implantation of AICD- [employer] Jude Un[employer] CORONARY ARTERY DISEASE (ICD-414.00)-lad stenting nonischemic cardiomyopathy COUMADIN THERAPY (ICD-V58.61) ATRIAL FIBRILLATION (ICD-427.31) CONGESTIVE HEART FAILURE-chronic systolic HYPERLIPIDEMIA (ICD-272.4) HYPERTENSION (ICD-401.9) DIABETES MELLITUS, TYPE II (ICD-250.00) RENAL DISEASE, CHRONIC, STAGE III (ICD-585.3) ENCOUNTER FOR LONG-TERM USE OF OTHER MEDICATIONS (ICD-V58.69) CONSTIPATION (ICD-564.00) TOTAL KNEE REPLACEMENT, HX OF (ICD-V45.89) HYPERKALEMIA (ICD-276.7) SLEEP APNEA (ICD-780.57) SPECIAL SCREENING FOR MALIGNANT NEOPLASMS COLON (ICD-V76.51) HEMATURIA UNSPECIFIED (ICD-599.70) ENCOUNTER FOR THERAPEUTIC DRUG MONITORING (ICD-V58.83) ADVEF, DRUG/MEDICINAL/BIOLOGICAL SUBST NOS (ICD-995.20) PROSTATE CANCER, HX OF (ICD-V10.46) COLON CANCER, HX OF (ICD-V10.05) OSTEOARTHRITIS (ICD-715.90) SKIN CANCER, HX OF (ICD-V10.83)  Past Surgical History: Last updated: 08/14/2009 Colectomy Prostatectomy 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  Jude Unify  Family History: Last updated: 04/18/2009 Positive for coronary artery disease on his father, otherwise noncontributory.  Social History: Last updated: 04/18/2009  The patient lives here in [place] with his wife.  He   is retired from [employer].  He quit smoking 15 years ago.  Denies any   recreational or alcohol use.  Tries to follow a heart-healthy diet.  For   exercise, he is actually participating in rehab post knee surgery 2 days   a week  and then also does some exercises in range of motion on his own.      Vital Signs:  Patient profile:   77 year old male Height:      72  inches Weight:      199 pounds BMI:     27.09 Pulse rate:   75 / minute Pulse rhythm:   regular BP sitting:   160 / 86  (left arm) Cuff size:   regular  Vitals Entered By: Judithe Modest CMA (Feb 05, 2010 9:56 AM)  Physical Exam  General:  The patient was alert and oriented in no acute distress.Neck veins were flat, carotids were brisk. Lungs were clear. Heart sounds were irregular without murmurs or gallops. Abdomen was soft with active bowel sounds. There is no clubbing cyanosis or edema. Affect is engaging     ICD Specifications Following MD:  Sherryl Manges, MD     Referring MD:  [facility] ICD Vendor:  St Jude     ICD Model Number:  [id number]     ICD Serial Number:  [id number] ICD DOI:  07/19/2009     ICD Implanting MD:  Sherryl Manges, MD  Lead 1:    Location: RV     DOI: 07/19/2009     Model #: [id number]     Serial #: [id number]     Status: active Lead 2:    Location: LV     DOI: 07/19/2009     Model #: [id number]     Serial #: [id number]     Status: active  ICD Follow Up Remote Check?  No Battery Voltage:  92% V     Charge Time:  8.3 seconds     Underlying rhythm:  AFIB ICD Dependent:  No       ICD Device Measurements Right Ventricle:  Amplitude: 12 mV, Impedance: 530 ohms, Threshold: 1.25 V at 0.4 msec Left Ventricle:  Impedance: 830 ohms, Threshold: 1.0 V at 1.0 msec Configuration: LV TIP TO RV COIL Shock Impedance: 50 ohms   Episodes Coumadin:  Yes Shock:  0     ATP:  0     Nonsustained:  0     Ventricular Pacing:  72%  Brady Parameters Mode VVIR     Lower Rate Limit:  70     Upper Rate Limit 120 Rate Response Parameters:  Threshold Auto +1.0, slope 7  Tachy Zones VF:  240     VT:  200     Tech Comments:  Base rate was increased to 70 at last office visit, resulting in increase in percentage of Bi-V pacing from 29% to 72%.  No changes made today.  ROV 3 months clinic. Gypsy Balsam RN BSN  Feb 05, 2010 10:14 AM   Impression & Recommendations:  Problem # 1:  IMPLANTATION OF  DEFIBRILLATOR, ST JUDE UNIFY (ICD-V45.02) Assessment New Device parameters and data were reviewed and no changes were made  Problem # 2:  ATRIAL FIBRILLATION (ICD-427.31) patient is paced 72% of the time heart rate excursion is such that only 2.5% of his beats are faster than 110 beats per minute and only 5% of his beats or faster than 100 beats per minute; It is worth considering the discontinuation of his aspirin as he is taking Coumadin His updated medication list for this problem includes:    Warfarin Sodium 5 Mg Tabs (Warfarin sodium) .Marland KitchenMarland KitchenMarland KitchenMarland Kitchen 5 mg m-w-f, 2.5 mg other days    Aspirin 81 Mg Tbec (Aspirin) ..... One by mouth every  day    Toprol Xl 100 Mg Xr24h-tab (Metoprolol succinate) .[name]... Take one tablet by mouth twice a day    Digoxin 0.125 Mg Tabs (Digoxin) .[name]... Take a one tablet by mouth daily  Problem # 3:  CONGESTIVE HEART FAILURE (ICD-428.0) much improved currently class II His updated medication list for this problem includes:    Warfarin Sodium 5 Mg Tabs (Warfarin sodium) .Marland KitchenMarland KitchenMarland KitchenMarland Kitchen 5 mg m-w-f, 2.5 mg other days    Aspirin 81 Mg Tbec (Aspirin) ..... One by mouth every day    Furosemide 80 Mg Tabs (Furosemide) .[name]... 1  tab once daily    Toprol Xl 100 Mg Xr24h-tab (Metoprolol succinate) .[name]... Take one tablet by mouth twice a day    Nitrostat 0.4 Mg Subl (Nitroglycerin) .[name]... As needed    Digoxin 0.125 Mg Tabs (Digoxin) .[name]... Take a one tablet by mouth daily  Problem # 4:  CARDIOMYOPATHY, PRIMARY, DILATED (ICD-425.4) stable on current medications. He is a candidate for Aldactone His updated medication list for this problem includes:    Warfarin Sodium 5 Mg Tabs (Warfarin sodium) .Marland KitchenMarland KitchenMarland KitchenMarland Kitchen 5 mg m-w-f, 2.5 mg other days    Aspirin 81 Mg Tbec (Aspirin) ..... One by mouth every day    Furosemide 80 Mg Tabs (Furosemide) .[name]... 1  tab once daily    Toprol Xl 100 Mg Xr24h-tab (Metoprolol succinate) .[name]... Take one tablet by mouth twice a day    Nitrostat 0.4 Mg Subl (Nitroglycerin) .[name]... As  needed    Digoxin 0.125 Mg Tabs (Digoxin) .[name]... Take a one tablet by mouth daily  Patient Instructions: 1)  Your physician recommends that you continue on your current medications as directed. Please refer to the Current Medication list given to you today. 2)  Your physician wants you to follow-up in: 12 months with Dr Graciela Husbands.  You will receive a reminder letter in the mail two months in advance. If you don't receive a letter, please call our office to schedule the follow-up appointment.

## 2010-10-22 NOTE — Assessment & Plan Note (Signed)
Summary: pt/njr  Nurse Visit   Allergies: 1)  ! * Betablockers ? Laboratory Results   Blood Tests     PT: 19.3 s   (Normal Range: 10.6-13.4)  INR: 2.5   (Normal Range: 0.88-1.12   Therap INR: 2.0-3.5) Comments: Rita Ohara  January 15, 2010 1:50 PM     Orders Added: 1)  Est. Patient Level I [99211] 2)  Protime [16109UE]    ANTICOAGULATION RECORD PREVIOUS REGIMEN & LAB RESULTS Anticoagulation Diagnosis:  Atrial Fibrillation on  07/16/2009 Previous INR Goal Range:  2.0-3.0 on  02/24/2008 Previous INR:  4.0 on  01/01/2010 Previous Coumadin Dose(mg):  5mg  on  07/25/2009 Previous Regimen:  same on  12/05/2009 Previous Coagulation Comments:  OV on  06/13/2008  NEW REGIMEN & LAB RESULTS Current INR: 2.5 Regimen: same  Repeat testing in: 1 month  Anticoagulation Visit Questionnaire Coumadin dose missed/changed:  No Abnormal Bleeding Symptoms:  Yes    Bruising or bleeding from nose or gums, in urine or stool since the last visit:  was not addmitted but went because the cut on his arm would not stop Any diet changes including alcohol intake, vegetables or greens since the last visit:  No Any illnesses or hospitalizations since the last visit:  No Any signs of clotting since the last visit (including chest discomfort, dizziness, shortness of breath, arm tingling, slurred speech, swelling or redness in leg):  No  MEDICATIONS WARFARIN SODIUM 5 MG TABS (WARFARIN SODIUM) 5 mg M-W-F, 2.5 mg other days ASPIRIN 81 MG  TBEC (ASPIRIN) one by mouth every day FUROSEMIDE 80 MG TABS (FUROSEMIDE) 1  tab once daily HYDRALAZINE HCL 25 MG TABS (HYDRALAZINE HCL) Take 1 tablet by mouth three times a day KLOR-CON M20 20 MEQ CR-TABS (POTASSIUM CHLORIDE CRYS CR) take 2 tablets AM TOPROL XL 100 MG XR24H-TAB (METOPROLOL SUCCINATE) take one tablet by mouth twice a day NITROSTAT 0.4 MG SUBL (NITROGLYCERIN) as needed DIGOXIN 0.125 MG TABS (DIGOXIN) Take a one tablet by mouth daily GLYBURIDE 1.25  MG TABS (GLYBURIDE) Take 1 tablet by mouth once a day with breakfast FREESTYLE LITE TEST  STRP (GLUCOSE BLOOD) check blood sugar daily  DX 250.00 JANUVIA 100 MG TABS (SITAGLIPTIN PHOSPHATE) Take 1 tab by mouth daily

## 2010-10-22 NOTE — Medication Information (Signed)
Summary: [facility]  Anticoagulant Therapy  Managed by: Weston Brass, PharmD Referring MD: Ermalene Postin PCP: Birdie Sons MD Supervising MD: Clifton James MD, Cristal Deer Indication 1: Atrial Fibrillation INR POC 1.8 INR RANGE 2.0-3.0  Dietary changes: yes       Details: ate some brocolli last night  Health status changes: no    Bleeding/hemorrhagic complications: no    Recent/future hospitalizations: no    Any changes in medication regimen? no    Recent/future dental: no  Any missed doses?: no       Is patient compliant with meds? yes      Comments: Normally followed by [facility].  INR has been low for the past 2 months.  Spoke with their office.  Canceled today's appt and rescheduled for 3 weeks out.   Allergies: 1)  ! * Betablockers ?  Anticoagulation Management History:      The patient is taking warfarin and comes in today for a routine follow up visit.  Positive risk factors for bleeding include an age of 95 years or older and presence of serious comorbidities.  Negative risk factors for bleeding include no history of CVA/TIA.  The bleeding index is 'intermediate risk'.  Positive CHADS2 values include History of CHF, History of HTN, Age > 70 years old, and History of Diabetes.  Negative CHADS2 values include Prior Stroke/CVA/TIA.  His last INR was 1.8.  Anticoagulation responsible Lunah Losasso: Clifton James MD, Cristal Deer.  INR POC: 1.8.  Exp: 06/2010.    Anticoagulation Management Assessment/Plan:      The patient's current anticoagulation dose is Warfarin sodium 5 mg tabs: 5 mg M-W-F, 2.5 mg other days.  The target INR is 2.0-3.0.  The next INR is due 06/25/2010.  Anticoagulation instructions were given to patient.  Results were reviewed/authorized by Weston Brass, PharmD.  He was notified by Weston Brass PharmD.         Prior Anticoagulation Instructions: INR: 2.0  Continue on the current regimen of 1/2 tablet daily except 1 tablet on Mondays, Wednesdays and Fridays.  Recheck in  3 weeks.  Current Anticoagulation Instructions: INR 1.8  Take 1 tablet today then increase dose to 1/2 tablet every day except 1 tablet on Monday and Friday.  Recheck INR at [facility] in 3 weeks.

## 2010-10-22 NOTE — Letter (Signed)
[facility]: [facility]   Imported By: Marylou Mccoy 08/20/2010 15:17:30  _____________________________________________________________________  External Attachment:    Type:   Image     Comment:   External Document

## 2010-10-22 NOTE — Assessment & Plan Note (Signed)
Summary: 2 month rov/njhr   Vital Signs:  Patient profile:   77 year old male Weight:      195 pounds Temp:     97.8 degrees F oral Pulse rate:   68 / minute Pulse rhythm:   regular Resp:     12 per minute BP sitting:   140 / 78  (left arm) Cuff size:   regular  Vitals Entered By: Gladis Riffle, RN (March 05, 2010 10:38 AM) CC: 2 month rov--CBGs indicate what has eaten and when per pt, 150 when snacks at home Is Patient Diabetic? Yes Did you bring your meter with you today? No   Primary Care Provider:  Birdie Sons MD  CC:  2 month rov--CBGs indicate what has eaten and when per pt and 150 when snacks at home.  History of Present Illness:  Follow-Up Visit      This is a 77 year old man who presents for Follow-up visit.  The patient denies chest pain and palpitations.  Since the last visit the patient notes no new problems or concerns.  The patient reports taking meds as prescribed.  When questioned about possible medication side effects, the patient notes none.  he does a great job monitoring weight (CHF), BP and DM  All other systems reviewed and were negative   Preventive Screening-Counseling & Management  Alcohol-Tobacco     Smoking Status: quit > 6 months     Year Started: 1950     Year Quit: 1964  Current Medications (verified): 1)  Warfarin Sodium 5 Mg Tabs (Warfarin Sodium) .... 5 Mg M-W-F, 2.5 Mg Other Days 2)  Aspirin 81 Mg  Tbec (Aspirin) .... One By Mouth Every Day 3)  Furosemide 80 Mg Tabs (Furosemide) .[name].. 1  Tab Once Daily 4)  Hydralazine Hcl 25 Mg Tabs (Hydralazine Hcl) .... Take 1 Tablet By Mouth Three Times A Day 5)  Klor-Con M20 20 Meq Cr-Tabs (Potassium Chloride Crys Cr) .... Take 2 Tablets Am 6)  Toprol Xl 100 Mg Xr24h-Tab (Metoprolol Succinate) .... Take One Tablet By Mouth Twice A Day 7)  Nitrostat 0.4 Mg Subl (Nitroglycerin) .... As Needed 8)  Digoxin 0.125 Mg Tabs (Digoxin) .... Take A One Tablet By Mouth Daily 9)  Glyburide 1.25 Mg Tabs (Glyburide)  .... Take 1 Tablet By Mouth Once A Day With Breakfast 10)  Freestyle Lite Test  Strp (Glucose Blood) .... Check Blood Sugar Daily  Dx 250.00 11)  Januvia 100 Mg Tabs (Sitagliptin Phosphate) .... Take 1 Tab By Mouth Daily 12)  Isosorbide Mononitrate Cr 30 Mg Xr24h-Tab (Isosorbide Mononitrate) .... Take One-Half  Tablet By Mouth Daily  Allergies: 1)  ! * Betablockers ?  Past History:  Past Medical History: Last updated: 08/14/2009 Current Problems:  Implantation of AICD- [employer] Un[employer] CORONARY ARTERY DISEASE (ICD-414.00)-lad stenting nonischemic cardiomyopathy COUMADIN THERAPY (ICD-V58.61) ATRIAL FIBRILLATION (ICD-427.31) CONGESTIVE HEART FAILURE-chronic systolic HYPERLIPIDEMIA (ICD-272.4) HYPERTENSION (ICD-401.9) DIABETES MELLITUS, TYPE II (ICD-250.00) RENAL DISEASE, CHRONIC, STAGE III (ICD-585.3) ENCOUNTER FOR LONG-TERM USE OF OTHER MEDICATIONS (ICD-V58.69) CONSTIPATION (ICD-564.00) TOTAL KNEE REPLACEMENT, HX OF (ICD-V45.89) HYPERKALEMIA (ICD-276.7) SLEEP APNEA (ICD-780.57) SPECIAL SCREENING FOR MALIGNANT NEOPLASMS COLON (ICD-V76.51) HEMATURIA UNSPECIFIED (ICD-599.70) ENCOUNTER FOR THERAPEUTIC DRUG MONITORING (ICD-V58.83) ADVEF, DRUG/MEDICINAL/BIOLOGICAL SUBST NOS (ICD-995.20) PROSTATE CANCER, HX OF (ICD-V10.46) COLON CANCER, HX OF (ICD-V10.05) OSTEOARTHRITIS (ICD-715.90) SKIN CANCER, HX OF (ICD-V10.83)  Past Surgical History: Last updated: 08/14/2009 Colectomy Prostatectomy 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  [employer]  Family  History: Last updated: 04/18/2009 Positive for coronary artery disease on his father, otherwise noncontributory.  Social History: Last updated: 04/18/2009  The patient lives here in [place] with his wife.  He   is retired from [employer].  He quit smoking 15 years ago.  Denies any   recreational or alcohol use.  Tries to follow a  heart-healthy diet.  For   exercise, he is actually participating in rehab post knee surgery 2 days   a week and then also does some exercises in range of motion on his own.      Risk Factors: Exercise: yes (07/13/2006)  Risk Factors: Smoking Status: quit > 6 months (03/05/2010)  Physical Exam  General:  alert and well-developed.   Eyes:  pupils equal and pupils round.   Ears:  R ear normal and L ear normal.   Neck:  No deformities, masses, or tenderness noted. Chest Wall:  no deformities.   Heart:  normal rate and regular rhythm.   Abdomen:  soft, non-tender, and normal bowel sounds.   overweight Msk:  No deformity or scoliosis noted of thoracic or lumbar spine.   Pulses:  R radial normal and L radial normal.   Neurologic:  cranial nerves II-XII intact and gait normal.   Skin:  turgor normal and color normal.   Psych:  normally interactive and good eye contact.     Complete Medication List: 1)  Warfarin Sodium 5 Mg Tabs (Warfarin sodium) .... 5 mg m-w-f, 2.5 mg other days 2)  Aspirin 81 Mg Tbec (Aspirin) .... One by mouth every day 3)  Furosemide 80 Mg Tabs (Furosemide) .[name].. 1  tab once daily 4)  Hydralazine Hcl 25 Mg Tabs (Hydralazine hcl) .... Take 1 tablet by mouth three times a day 5)  Klor-con M20 20 Meq Cr-tabs (Potassium chloride crys cr) .... Take 2 tablets am 6)  Toprol Xl 100 Mg Xr24h-tab (Metoprolol succinate) .... Take one tablet by mouth twice a day 7)  Nitrostat 0.4 Mg Subl (Nitroglycerin) .... As needed 8)  Digoxin 0.125 Mg Tabs (Digoxin) .... Take a one tablet by mouth daily 9)  Glyburide 1.25 Mg Tabs (Glyburide) .... Take 1 tablet by mouth once a day with breakfast 10)  Freestyle Lite Test Strp (Glucose blood) .... Check blood sugar daily  dx 250.00 11)  Januvia 100 Mg Tabs (Sitagliptin phosphate) .... Take 1 tab by mouth daily 12)  Isosorbide Mononitrate Cr 30 Mg Xr24h-tab (Isosorbide mononitrate) .... Take one-half  tablet by mouth daily  Other  Orders: Durable Medical Equipment (DME) TLB-BMP (Basic Metabolic Panel-BMET) (80048-METABOL) Venipuncture (69629) TLB-CBC Platelet - w/Differential (85025-CBCD) TLB-TSH (Thyroid Stimulating Hormone) (84443-TSH)  Patient Instructions: 1)  Please schedule a follow-up appointment in 2 months. 2)  labs one week prior to visit 3)  lipids---272.4 4)  lfts-995.2 5)  bmet-995.2 6)  A1C-250.02 7)      Appended Document: Orders Update    Clinical Lists Changes  Problems: Added new problem of ENCOUNTER FOR THERAPEUTIC DRUG MONITORING (ICD-V58.83) Orders: Added new Service order of Protime [phone]) - Signed Observations: Added new observation of ABNORM BLEED: No (03/05/2010 12:14) Added new observation of COUMADIN CHG: No (03/05/2010 12:14) Added new observation of CUR. REGIMEN: 2.5mg  qd (03/05/2010 12:14) Added new observation of NEXT PT: 1 week (03/05/2010 12:14) Added new observation of COUM TAB MG: 5mg  on m,w,f 2.5mg  other days (03/05/2010 12:14) Added new observation of COMMENTS2: Wynona Canes, CMA  March 05, 2010 12:15 PM  (03/05/2010 12:14) Added new  observation of INR: 4.4  (03/05/2010 12:14)      Laboratory Results   Blood Tests   Date/Time Recieved: March 05, 2010 12:15 PM   Date/Time Reported: March 05, 2010 12:15 PM    INR: 4.4   (Normal Range: 0.88-1.12   Therap INR: 2.0-3.5) Comments: Wynona Canes, CMA  March 05, 2010 12:15 PM       ANTICOAGULATION RECORD PREVIOUS REGIMEN & LAB RESULTS Anticoagulation Diagnosis:  Atrial Fibrillation on  07/16/2009 Previous INR Goal Range:  2.0-3.0 on  02/24/2008 Previous INR:  2.7 on  02/12/2010 Previous Coumadin Dose(mg):  5mg  on  07/25/2009 Previous Regimen:  same on  02/12/2010 Previous Coagulation Comments:  OV on  06/13/2008  NEW REGIMEN & LAB RESULTS Current INR: 4.4 Current Coumadin Dose(mg): 5mg  on m,w,f 2.5mg  other days Regimen: 2.5mg  qd       Repeat testing in: 1 week MEDICATIONS WARFARIN SODIUM  5 MG TABS (WARFARIN SODIUM) 5 mg M-W-F, 2.5 mg other days ASPIRIN 81 MG  TBEC (ASPIRIN) one by mouth every day FUROSEMIDE 80 MG TABS (FUROSEMIDE) 1  tab once daily HYDRALAZINE HCL 25 MG TABS (HYDRALAZINE HCL) Take 1 tablet by mouth three times a day KLOR-CON M20 20 MEQ CR-TABS (POTASSIUM CHLORIDE CRYS CR) take 2 tablets AM TOPROL XL 100 MG XR24H-TAB (METOPROLOL SUCCINATE) take one tablet by mouth twice a day NITROSTAT 0.4 MG SUBL (NITROGLYCERIN) as needed DIGOXIN 0.125 MG TABS (DIGOXIN) Take a one tablet by mouth daily GLYBURIDE 1.25 MG TABS (GLYBURIDE) Take 1 tablet by mouth once a day with breakfast FREESTYLE LITE TEST  STRP (GLUCOSE BLOOD) check blood sugar daily  DX 250.00 JANUVIA 100 MG TABS (SITAGLIPTIN PHOSPHATE) Take 1 tab by mouth daily ISOSORBIDE MONONITRATE CR 30 MG XR24H-TAB (ISOSORBIDE MONONITRATE) Take one-half  tablet by mouth daily   Anticoagulation Visit Questionnaire      Coumadin dose missed/changed:  No      Abnormal Bleeding Symptoms:  No   Any diet changes including alcohol intake, vegetables or greens since the last visit:  No Any illnesses or hospitalizations since the last visit:  No Any signs of clotting since the last visit (including chest discomfort, dizziness, shortness of breath, arm tingling, slurred speech, swelling or redness in leg):  No

## 2010-10-22 NOTE — Assessment & Plan Note (Signed)
Summary: 1 month rov/also pt/njr   Vital Signs:  Patient profile:   77 year old male Weight:      200 pounds Temp:     98.4 degrees F oral Pulse rate:   80 / minute Pulse rhythm:   regular BP sitting:   150 / 86  (left arm) Cuff size:   large  Vitals Entered By: Alfred Levins, CMA (August 07, 2010 10:57 AM) CC: blurry vision x1 mth   Primary Care Raymundo Rout:  Birdie Sons MD  CC:  blurry vision x1 mth.  History of Present Illness:  Follow-Up Visit      This is a 77 year old man who presents for Follow-up visit.  The patient denies chest pain and palpitations.  Since the last visit the patient notes no new problems or concerns.  The patient reports taking meds as prescribed and dietary compliance.  When questioned about possible medication side effects, the patient notes none.  patient states his blurred vision is somewhat better. He relates it to furosemide therapy. No other change in vision. He has seen ophthalmology.  Patient denies chest pain, shortness breath, PND, orthopnea although he does note that with exertion he is more short of breath than usual. He is also noted some weight gain. No other complaints in a complete review of systems.  Current Problems (verified): 1)  Blurred Vision  (ICD-368.8) 2)  Encounter For Therapeutic Drug Monitoring  (ICD-V58.83) 3)  Implantation of Defibrillator, [employer] Jude Unify  (ICD-V45.02) 4)  Cardiomyopathy, Primary, Dilated  (ICD-425.4) 5)  Coronary Artery Disease  (ICD-414.00) 6)  Coumadin Therapy  (ICD-V58.61) 7)  Atrial Fibrillation  (ICD-427.31) 8)  Congestive Heart Failure  (ICD-428.0) 9)  Hyperlipidemia  (ICD-272.4) 10)  Hypertension  (ICD-401.9) 11)  Diabetes Mellitus, Type II  (ICD-250.00) 12)  Renal Disease, Chronic, Stage Iii  (ICD-585.3) 13)  Total Knee Replacement, Hx of  (ICD-V45.89) 14)  Sleep Apnea  (ICD-780.57) 15)  Hematuria Unspecified  (ICD-599.70) 16)  Prostate Cancer, Hx of  (ICD-V10.46) 17)  Colon Cancer, Hx of   (ICD-V10.05) 18)  Osteoarthritis  (ICD-715.90) 19)  Skin Cancer, Hx of  (ICD-V10.83)  Current Medications (verified): 1)  Warfarin Sodium 5 Mg Tabs (Warfarin Sodium) .... Take As Directed 2)  Aspirin 81 Mg  Tbec (Aspirin) .... One By Mouth Every Day 3)  Furosemide 80 Mg Tabs (Furosemide) .... 1/2 -1  Tab Once Daily 4)  Hydralazine Hcl 25 Mg Tabs (Hydralazine Hcl) .... Take 1 Tablet By Mouth Three Times A Day 5)  Klor-Con M20 20 Meq Cr-Tabs (Potassium Chloride Crys Cr) .... Take 2 Tablets Am 6)  Metoprolol Succinate 100 Mg Xr24h-Tab (Metoprolol Succinate) .... Take 1 Tablet By Mouth Two Times A Day 7)  Nitrostat 0.4 Mg Subl (Nitroglycerin) .... As Needed 8)  Lanoxin 0.125 Mg Tabs (Digoxin) .[name].. 1 Tab Daily 9)  Glyburide 1.25 Mg Tabs (Glyburide) .... Take 1 Tablet By Mouth Once A Day With Breakfast 10)  Freestyle Lite Test  Strp (Glucose Blood) .... Check Blood Sugar Daily  Dx 250.00 11)  Isosorbide Mononitrate Cr 30 Mg Xr24h-Tab (Isosorbide Mononitrate) .... Take One-Half  Tablet By Mouth Daily  Allergies (verified): No Known Drug Allergies  Past History:  Past Medical History: Last updated: 08/14/2009 Current Problems:  Implantation of AICD- [employer] CORONARY ARTERY DISEASE (ICD-414.00)-lad stenting nonischemic cardiomyopathy COUMADIN THERAPY (ICD-V58.61) ATRIAL FIBRILLATION (ICD-427.31) CONGESTIVE HEART FAILURE-chronic systolic HYPERLIPIDEMIA (ICD-272.4) HYPERTENSION (ICD-401.9) DIABETES MELLITUS, TYPE II (ICD-250.00) RENAL DISEASE, CHRONIC, STAGE III (ICD-585.3) ENCOUNTER  FOR LONG-TERM USE OF OTHER MEDICATIONS (ICD-V58.69) CONSTIPATION (ICD-564.00) TOTAL KNEE REPLACEMENT, HX OF (ICD-V45.89) HYPERKALEMIA (ICD-276.7) SLEEP APNEA (ICD-780.57) SPECIAL SCREENING FOR MALIGNANT NEOPLASMS COLON (ICD-V76.51) HEMATURIA UNSPECIFIED (ICD-599.70) ENCOUNTER FOR THERAPEUTIC DRUG MONITORING (ICD-V58.83) ADVEF, DRUG/MEDICINAL/BIOLOGICAL SUBST NOS (ICD-995.20) PROSTATE CANCER, HX OF  (ICD-V10.46) COLON CANCER, HX OF (ICD-V10.05) OSTEOARTHRITIS (ICD-715.90) SKIN CANCER, HX OF (ICD-V10.83)  Past Surgical History: Last updated: 08/14/2009 Colectomy Prostatectomy 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  Family History: Last updated: 04/18/2009 Positive for coronary artery disease on his father, otherwise noncontributory.  Social History: Last updated: 05/07/2010  The patient lives here in [place] with his wife.  He   is retired from [employer].  He quit smoking 1968.  Denies any   recreational or alcohol use.  Tries to follow a heart-healthy diet.  For   exercise, he is actually participating in rehab post knee surgery 2 days   a week and then also does some exercises in range of motion on his own.      Risk Factors: Exercise: yes (07/13/2006)  Risk Factors: Smoking Status: quit > 6 months (05/07/2010)  Physical Exam  General:  elderly male in no acute distress. HEENT exam atraumatic, normocephalic symmetric muscles are intact. Neck is supple without lymphadenopathy or thyromegaly. jugular venous pressure seems to be normal. Chest clear to auscultation cardiac exam S1-S2 are irregular. Abdominal exam active bowel sounds, soft. Extremities with 1+ edema to mid calf.   Impression & Recommendations:  Problem # 1:  CONGESTIVE HEART FAILURE (ICD-428.0) I think he has had some fluid accumulation. He is getting some weight. He has some lower extremity edema and he describes dyspnea on exertion. Will increase furosemide therapy. He will followup with me shortly. In regards to his blurred vision I'm unclear as to the etiology. He is seeing ophthalmology. Patient relates this to furosemide therapy but I think it's better that he increases furosemide at this time. His updated medication list for this problem includes:    Warfarin Sodium 5 Mg Tabs (Warfarin sodium)  .[name]... Take as directed    Aspirin 81 Mg Tbec (Aspirin) ..... One by mouth every day    Furosemide 80 Mg Tabs (Furosemide) .[name]... 1/2 every other day alternating with take one tablet by mouth every other day    Metoprolol Succinate 100 Mg Xr24h-tab (Metoprolol succinate) .[name]... Take 1 tablet by mouth two times a day    Lanoxin 0.125 Mg Tabs (Digoxin) .[name]... 1 tab daily  LVEF: 15 (06/21/2009)  Echocardiogram:  Study Conclusions    - Left ventricle: The cavity size was normal. Wall thickness was     increased in a pattern of mild LVH. Systolic function was     moderately to severely reduced. The estimated ejection fraction     was in the range of 30% to 35%. Diffuse hypokinesis.   - Aortic valve: Mild regurgitation. Valve area: 2.13cm^2(VTI). Valve     area: 2.3cm^2 (Vmax).   - Mitral valve: Mild regurgitation.   - Left atrium: The atrium was moderately dilated.   - Right atrium: The atrium was moderately dilated.   - Pulmonary arteries: Systolic pressure was mildly increased.   Prepared and Electronically Authenticated by    Olga Millers, MD, [facility] (01/09/2010)  Complete Medication List: 1)  Warfarin Sodium 5 Mg Tabs (Warfarin sodium) .... Take as directed 2)  Aspirin 81 Mg Tbec (Aspirin) .... One by mouth every day 3)  Furosemide 80 Mg  Tabs (Furosemide) .... 1/2 every other day alternating with take one tablet by mouth every other day 4)  Hydralazine Hcl 25 Mg Tabs (Hydralazine hcl) .... Take 1 tablet by mouth three times a day 5)  Klor-con M20 20 Meq Cr-tabs (Potassium chloride crys cr) .... Take 2 tablets am 6)  Metoprolol Succinate 100 Mg Xr24h-tab (Metoprolol succinate) .... Take 1 tablet by mouth two times a day 7)  Nitrostat 0.4 Mg Subl (Nitroglycerin) .... As needed 8)  Lanoxin 0.125 Mg Tabs (Digoxin) .[name].. 1 tab daily 9)  Glyburide 1.25 Mg Tabs (Glyburide) .... Take 1 tablet by mouth once a day with breakfast 10)  Freestyle Lite Test Strp (Glucose blood) .... Check blood sugar  daily  dx 250.00 11)  Isosorbide Mononitrate Cr 30 Mg Xr24h-tab (Isosorbide mononitrate) .... Take one-half  tablet by mouth daily  Other Orders: Protime (16109UE)  Patient Instructions: 1)  10 days   Orders Added: 1)  Est. Patient Level I [45409] 2)  Protime [81191YN] 3)  Est. Patient Level IV [82956]    Laboratory Results   Blood Tests   Date/Time Recieved: August 07, 2010 2:21 PM  Date/Time Reported: August 07, 2010 2:21 PM    INR: 2.0   (Normal Range: 0.88-1.12   Therap INR: 2.0-3.5) Comments: Wynona Canes, CMA  August 07, 2010 2:21 PM       ANTICOAGULATION RECORD PREVIOUS REGIMEN & LAB RESULTS Anticoagulation Diagnosis:  Atrial Fibrillation on  07/16/2009 Previous INR Goal Range:  2.0-3.0 on  02/24/2008 Previous INR:  2.2 on  06/25/2010 Previous Coumadin Dose(mg):  5mg  on mon & fri then 2.5mg  on other days on  06/25/2010 Previous Regimen:  same on  03/12/2010 Previous Coagulation Comments:  OV on  06/13/2008  NEW REGIMEN & LAB RESULTS Current INR: 2.0 Regimen: same  (no change)       Repeat testing in: 4 weeks MEDICATIONS WARFARIN SODIUM 5 MG TABS (WARFARIN SODIUM) take as directed ASPIRIN 81 MG  TBEC (ASPIRIN) one by mouth every day FUROSEMIDE 80 MG TABS (FUROSEMIDE) 1/2 every other day alternating with Take one tablet by mouth every other day HYDRALAZINE HCL 25 MG TABS (HYDRALAZINE HCL) Take 1 tablet by mouth three times a day KLOR-CON M20 20 MEQ CR-TABS (POTASSIUM CHLORIDE CRYS CR) take 2 tablets AM METOPROLOL SUCCINATE 100 MG XR24H-TAB (METOPROLOL SUCCINATE) Take 1 tablet by mouth two times a day NITROSTAT 0.4 MG SUBL (NITROGLYCERIN) as needed LANOXIN 0.125 MG TABS (DIGOXIN) 1 tab daily GLYBURIDE 1.25 MG TABS (GLYBURIDE) Take 1 tablet by mouth once a day with breakfast FREESTYLE LITE TEST  STRP (GLUCOSE BLOOD) check blood sugar daily  DX 250.00 ISOSORBIDE MONONITRATE CR 30 MG XR24H-TAB (ISOSORBIDE MONONITRATE) Take one-half  tablet by mouth  daily   Anticoagulation Visit Questionnaire      Coumadin dose missed/changed:  No      Abnormal Bleeding Symptoms:  No   Any diet changes including alcohol intake, vegetables or greens since the last visit:  No Any illnesses or hospitalizations since the last visit:  No Any signs of clotting since the last visit (including chest discomfort, dizziness, shortness of breath, arm tingling, slurred speech, swelling or redness in leg):  No

## 2010-10-22 NOTE — Assessment & Plan Note (Signed)
Summary: f6w   Visit Type:  6 wk Primary Provider:  Birdie Sons MD  CC:  sob...denies any edma or cp.  History of Present Illness: Patient has adapted without difficulty.  He is now swimming, and he has advanced his ability.  He has learned how to adjust.  Has seen Dr. Cato Mulligan.  Dig level is low despite renal findings.  See Graciela Husbands note.  Still fast.  Parameters changed.  Held off on AV ablation.  Current Medications (verified): 1)  Warfarin Sodium 5 Mg Tabs (Warfarin Sodium) .... 5 Mg M-W-F, 2.5 Mg Other Days 2)  Aspirin 81 Mg  Tbec (Aspirin) .... One By Mouth Every Day 3)  Furosemide 80 Mg Tabs (Furosemide) .[name].. 1  Tab Once Daily 4)  Hydralazine Hcl 25 Mg Tabs (Hydralazine Hcl) .... Take 1 Tablet By Mouth Three Times A Day 5)  Klor-Con M20 20 Meq Cr-Tabs (Potassium Chloride Crys Cr) .... Take 2 Tablets Am 6)  Senna S 8.6-50 Mg Tabs (Sennosides-Docusate Sodium) .... One By Mouth Daily 7)  Omeprazole 20 Mg Cpdr (Omeprazole) .... One By Mouth Daily 8)  Toprol Xl 100 Mg Xr24h-Tab (Metoprolol Succinate) .... Take One Tablet By Mouth Twice A Day 9)  Nitrostat 0.4 Mg Subl (Nitroglycerin) .... As Needed 10)  Digoxin 0.125 Mg Tabs (Digoxin) .... Take A Half  Tablet By Mouth Daily 11)  Glyburide 1.25 Mg Tabs (Glyburide) .... Take 1 Tablet By Mouth Once A Day With Breakfast 12)  Freestyle Lite Test  Strp (Glucose Blood) .... Check Blood Sugar Daily  Dx 250.00 13)  Januvia 100 Mg Tabs (Sitagliptin Phosphate) .... Take 1 Tab By Mouth Daily  Allergies: 1)  ! * Betablockers ?  Vital Signs:  Patient profile:   77 year old male Height:      72 inches Weight:      196 pounds BMI:     26.68 Pulse rate:   80 / minute Pulse rhythm:   regular BP sitting:   158 / 88  (left arm) Cuff size:   regular  Vitals Entered By: Danielle Rankin, CMA (November 21, 2009 10:01 AM)  Physical Exam  General:  Well developed, well nourished, in no acute distress. Lungs:  Clear bilaterally to auscultation and  percussion. Heart:  Somewhat regular rhtyhm at present.  See Graciela Husbands note. Extremities:  NO edema at present. Neurologic:  Alert and oriented x 3.    ICD Specifications Following MD:  Sherryl Manges, MD     Referring MD:  [facility] ICD Vendor:  [facility]     ICD Model Number:  [phone]     ICD Serial Number:  [id number] ICD DOI:  07/19/2009     ICD Implanting MD:  Sherryl Manges, MD  Lead 1:    Location: RV     DOI: 07/19/2009     Model #: [id number]     Serial #: [id number]     Status: active Lead 2:    Location: LV     DOI: 07/19/2009     Model #: [id number]     Serial #: [id number]     Status: active  ICD Follow Up ICD Dependent:  No       ICD Device Measurements Configuration: LV TIP TO RV COIL  Episodes Coumadin:  Yes  Brady Parameters Mode VVIR     Lower Rate Limit:  70     Rate Response Parameters:  Threshold Auto +1.0, slope 7  Tachy Zones VF:  240  VT:  200     Impression & Recommendations:  Problem # 1:  ATRIAL FIBRILLATION (ICD-427.31) See Graciela Husbands note.  Will increase lanoxin to 0.125mg , check level in two weeks, return in 4 weeks.   His updated medication list for this problem includes:    Warfarin Sodium 5 Mg Tabs (Warfarin sodium) .Marland KitchenMarland KitchenMarland KitchenMarland Kitchen 5 mg m-w-f, 2.5 mg other days    Aspirin 81 Mg Tbec (Aspirin) ..... One by mouth every day    Toprol Xl 100 Mg Xr24h-tab (Metoprolol succinate) .[name]... Take one tablet by mouth twice a day    Digoxin 0.125 Mg Tabs (Digoxin) .[name]... Take a one tablet by mouth daily  Problem # 2:  CARDIOMYOPATHY, PRIMARY, DILATED (ICD-425.4) Medications somewhat limited by renal function.  Functional status now really Class II vs. Class III. The following medications were removed from the medication list:    Nitrostat 0.4 Mg Subl (Nitroglycerin) .[name]... As directed His updated medication list for this problem includes:    Warfarin Sodium 5 Mg Tabs (Warfarin sodium) .Marland KitchenMarland KitchenMarland KitchenMarland Kitchen 5 mg m-w-f, 2.5 mg other days    Aspirin 81 Mg Tbec (Aspirin) ..... One by mouth every day    Furosemide 80  Mg Tabs (Furosemide) .[name]... 1  tab once daily    Toprol Xl 100 Mg Xr24h-tab (Metoprolol succinate) .[name]... Take one tablet by mouth twice a day    Nitrostat 0.4 Mg Subl (Nitroglycerin) .[name]... As needed    Digoxin 0.125 Mg Tabs (Digoxin) .[name]... Take a one tablet by mouth daily  Problem # 3:  RENAL DISEASE, CHRONIC, STAGE III (ICD-585.3) Dig increased per Dr. Graciela Husbands.  Will check level quickly, and reevaluate in 4 weeks.  Problem # 4:  HYPERTENSION (ICD-401.9) Better at home.   His updated medication list for this problem includes:    Aspirin 81 Mg Tbec (Aspirin) ..... One by mouth every day    Furosemide 80 Mg Tabs (Furosemide) .[name]... 1  tab once daily    Hydralazine Hcl 25 Mg Tabs (Hydralazine hcl) .[name]... Take 1 tablet by mouth three times a day    Toprol Xl 100 Mg Xr24h-tab (Metoprolol succinate) .[name]... Take one tablet by mouth twice a day  Patient Instructions: 1)  Your physician recommends that you schedule a follow-up appointment in: 4 WEEKS 2)  Your physician recommends that you return for lab work in: 2 WEEKS for Digoxin level (425.4, 414.00, 427.31)--do not take digoxin the morning of lab test  3)  Your physician has recommended you make the following change in your medication: INCREASE Digoxin to 0.125mg  one tablet daily Prescriptions: DIGOXIN 0.125 MG TABS (DIGOXIN) Take a one tablet by mouth daily  #30 x 6   Entered by:   Julieta Gutting, RN, BSN   Authorized by:   Ronaldo Miyamoto, MD, [facility]   Signed by:   Julieta Gutting, RN, BSN on 11/21/2009   Method used:   Electronically to        [facility] [id number] (retail)       [address] [place], [place]  [address]       Ph: [phone]       Fax: [phone]   RxID:   [id number]

## 2010-10-22 NOTE — Assessment & Plan Note (Signed)
Summary: [name] [name]   Visit Type:  Follow-up Primary Provider:  Birdie Sons MD   History of Present Illness: Patient and wife think he is much better than he was prior to the procedure.  His stamina is pretty good.  Overall, his breathing is better overall, and he thinks so as well.  He does have spells, when he cannot get his breath.   He can now do his job without coming home exhausted.   d Current Medications (verified): 1)  Warfarin Sodium 5 Mg Tabs (Warfarin Sodium) .... 5 Mg M-W-F, 2.5 Mg Other Days 2)  Aspirin 81 Mg  Tbec (Aspirin) .... One By Mouth Every Day 3)  Furosemide 80 Mg Tabs (Furosemide) .[name].. 1  Tab Once Daily 4)  Hydralazine Hcl 25 Mg Tabs (Hydralazine Hcl) .... Take 1 Tablet By Mouth Three Times A Day 5)  Klor-Con M20 20 Meq Cr-Tabs (Potassium Chloride Crys Cr) .... Take 2 Tablets Am 6)  Senna S 8.6-50 Mg Tabs (Sennosides-Docusate Sodium) .... One By Mouth Daily 7)  Omeprazole 20 Mg Cpdr (Omeprazole) .... One By Mouth Daily 8)  Toprol Xl 100 Mg Xr24h-Tab (Metoprolol Succinate) .... Take One Tablet By Mouth Twice A Day 9)  Nitrostat 0.4 Mg Subl (Nitroglycerin) .... As Needed 10)  Digoxin 0.125 Mg Tabs (Digoxin) .... Take A Half  Tablet By Mouth Daily 11)  Glyburide 1.25 Mg Tabs (Glyburide) .... Take 1 Tablet By Mouth Once A Day With Breakfast 12)  Freestyle Lite Test  Strp (Glucose Blood) .... Check Blood Sugar Daily  Dx 250.00 13)  Nitrostat 0.4 Mg Subl (Nitroglycerin) .... As Directed 14)  Januvia 100 Mg Tabs (Sitagliptin Phosphate) .... Take 1 Tab By Mouth Daily  Allergies (verified): 1)  ! * Betablockers ?  Past History:  Past Medical History: Last updated: 08/14/2009 Current Problems:  Implantation of AICD- [employer] CORONARY ARTERY DISEASE (ICD-414.00)-lad stenting nonischemic cardiomyopathy COUMADIN THERAPY (ICD-V58.61) ATRIAL FIBRILLATION (ICD-427.31) CONGESTIVE HEART FAILURE-chronic systolic HYPERLIPIDEMIA (ICD-272.4) HYPERTENSION  (ICD-401.9) DIABETES MELLITUS, TYPE II (ICD-250.00) RENAL DISEASE, CHRONIC, STAGE III (ICD-585.3) ENCOUNTER FOR LONG-TERM USE OF OTHER MEDICATIONS (ICD-V58.69) CONSTIPATION (ICD-564.00) TOTAL KNEE REPLACEMENT, HX OF (ICD-V45.89) HYPERKALEMIA (ICD-276.7) SLEEP APNEA (ICD-780.57) SPECIAL SCREENING FOR MALIGNANT NEOPLASMS COLON (ICD-V76.51) HEMATURIA UNSPECIFIED (ICD-599.70) ENCOUNTER FOR THERAPEUTIC DRUG MONITORING (ICD-V58.83) ADVEF, DRUG/MEDICINAL/BIOLOGICAL SUBST NOS (ICD-995.20) PROSTATE CANCER, HX OF (ICD-V10.46) COLON CANCER, HX OF (ICD-V10.05) OSTEOARTHRITIS (ICD-715.90) SKIN CANCER, HX OF (ICD-V10.83)  Vital Signs:  Patient profile:   77 year old male Height:      72 inches Weight:      196 pounds BMI:     26.68 Pulse rate:   64 / minute BP sitting:   130 / 84  (left arm)  Vitals Entered By: Laurance Flatten CMA (October 08, 2009 1:37 PM)  Physical Exam  General:  Well developed, well nourished, in no acute distress. Lungs:  Clear bilaterally to auscultation and percussion. Heart:  Irregularly, irregular rhtyhm.   Abdomen:  Bowel sounds positive; abdomen soft and non-tender without masses, organomegaly, or hernias noted. No hepatosplenomegaly. Msk:  Back normal, normal gait. Muscle strength and tone normal. Extremities:  No clubbing or cyanosis.  No real edema. Neurologic:  Alert and oriented x 3.    ICD Specifications Following MD:  Sherryl Manges, MD     Referring MD:  [facility] ICD Vendor:  [facility]     ICD Model Number:  [id number]     ICD Serial Number:  [id number] ICD DOI:  07/19/2009     ICD  Implanting MD:  Sherryl Manges, MD  Lead 1:    Location: RV     DOI: 07/19/2009     Model #: [id number]     Serial #: [id number]     Status: active Lead 2:    Location: LV     DOI: 07/19/2009     Model #: [id number]     Serial #: [id number]     Status: active  ICD Follow Up ICD Dependent:  No       ICD Device Measurements Configuration: LV TIP TO RV COIL  Brady Parameters Mode VVI     Lower Rate  Limit:  60      Tachy Zones VF:  240     VT:  200     Impression & Recommendations:  Problem # 1:  CARDIOMYOPATHY, PRIMARY, DILATED (ICD-425.4) Symptoms are improved.  He has debated merits of ablation versus none at present.  He is not sure.  Problem # 2:  ATRIAL FIBRILLATION (ICD-427.31) rate is still a little fast.   His updated medication list for this problem includes:    Warfarin Sodium 5 Mg Tabs (Warfarin sodium) .Marland KitchenMarland KitchenMarland KitchenMarland Kitchen 5 mg m-w-f, 2.5 mg other days    Aspirin 81 Mg Tbec (Aspirin) ..... One by mouth every day    Toprol Xl 100 Mg Xr24h-tab (Metoprolol succinate) .[name]... Take one tablet by mouth twice a day    Digoxin 0.125 Mg Tabs (Digoxin) .[name]... Take a half  tablet by mouth daily  Problem # 3:  HYPERTENSION (ICD-401.9)  His updated medication list for this problem includes:    Aspirin 81 Mg Tbec (Aspirin) ..... One by mouth every day    Furosemide 80 Mg Tabs (Furosemide) .[name]... 1  tab once daily    Hydralazine Hcl 25 Mg Tabs (Hydralazine hcl) .[name]... Take 1 tablet by mouth three times a day    Toprol Xl 100 Mg Xr24h-tab (Metoprolol succinate) .[name]... Take one tablet by mouth twice a day  Problem # 4:  CORONARY ARTERY DISEASE (ICD-414.00) see last cath. His updated medication list for this problem includes:    Warfarin Sodium 5 Mg Tabs (Warfarin sodium) .Marland KitchenMarland KitchenMarland KitchenMarland Kitchen 5 mg m-w-f, 2.5 mg other days    Aspirin 81 Mg Tbec (Aspirin) ..... One by mouth every day    Toprol Xl 100 Mg Xr24h-tab (Metoprolol succinate) .[name]... Take one tablet by mouth twice a day    Nitrostat 0.4 Mg Subl (Nitroglycerin) .[name]... As needed    Nitrostat 0.4 Mg Subl (Nitroglycerin) .[name]... As directed  Patient Instructions: 1)  Your physician recommends that you continue on your current medications as directed. Please refer to the Current Medication list given to you today. 2)  Your physician recommends that you schedule a follow-up appointment in: 6 WEEKS

## 2010-10-22 NOTE — Assessment & Plan Note (Signed)
Summary: f2w   Visit Type:  2 weeks follow up Primary Provider:  Birdie Sons MD  CC:  Pt. still experiencing headaches and muscle pain.  History of Present Illness: Still having some muscle pain and headaches, but he thinks things are abating.  He things he needs time to adjust to these things.  Dr. Cato Mulligan adjusted some stomach medications.  Patient able to do more things at this point.  He could walk up to [employer] at a reasonable pace without dyspnea.  He spends up to five hours per day walking.  He seems to be doing better.  Dr. Cato Mulligan also has started Januvia.    Current Medications (verified): 1)  Warfarin Sodium 5 Mg Tabs (Warfarin Sodium) .... 5 Mg M-W-F, 2.5 Mg Other Days 2)  Aspirin 81 Mg  Tbec (Aspirin) .... One By Mouth Every Day 3)  Furosemide 80 Mg Tabs (Furosemide) .[name].. 1  Tab Once Daily 4)  Hydralazine Hcl 25 Mg Tabs (Hydralazine Hcl) .... Take 1 Tablet By Mouth Three Times A Day 5)  Klor-Con M20 20 Meq Cr-Tabs (Potassium Chloride Crys Cr) .... Take 2 Tablets Am 6)  Toprol Xl 100 Mg Xr24h-Tab (Metoprolol Succinate) .... Take One Tablet By Mouth Twice A Day 7)  Nitrostat 0.4 Mg Subl (Nitroglycerin) .... As Needed 8)  Digoxin 0.125 Mg Tabs (Digoxin) .... Take A One Tablet By Mouth Daily 9)  Glyburide 1.25 Mg Tabs (Glyburide) .... Take 1 Tablet By Mouth Once A Day With Breakfast 10)  Freestyle Lite Test  Strp (Glucose Blood) .... Check Blood Sugar Daily  Dx 250.00 11)  Januvia 100 Mg Tabs (Sitagliptin Phosphate) .... Take 1 Tab By Mouth Daily  Allergies: 1)  ! * Betablockers ?  Vital Signs:  Patient profile:   77 year old male Height:      72 inches Weight:      198.50 pounds BMI:     27.02 Pulse rate:   72 / minute Pulse rhythm:   regular Resp:     18 per minute BP sitting:   160 / 92  (left arm) Cuff size:   large  Vitals Entered By: Vikki Ports (January 08, 2010 2:01 PM)  Physical Exam  General:  Well developed, well nourished, in no acute distress. Head:   normocephalic and atraumatic Eyes:  PERRLA/EOM intact; conjunctiva and lids normal. Lungs:  Clear bilaterally to auscultation and percussion. Heart:  Occasionally irregular.  No sig murmur at present.   Abdomen:  Bowel sounds positive; abdomen soft and non-tender without masses, organomegaly, or hernias noted. No hepatosplenomegaly. Pulses:  pulses normal in all 4 extremities Extremities:  No clubbing or cyanosis.  trace edema.  Neurologic:  Alert and oriented x 3.    ICD Specifications Following MD:  Sherryl Manges, MD     Referring MD:  [facility] ICD Vendor:  [facility]     ICD Model Number:  [phone]     ICD Serial Number:  [id number] ICD DOI:  07/19/2009     ICD Implanting MD:  Sherryl Manges, MD  Lead 1:    Location: RV     DOI: 07/19/2009     Model #: [id number]     Serial #: [id number]     Status: active Lead 2:    Location: LV     DOI: 07/19/2009     Model #: [id number]     Serial #: [id number]     Status: active  ICD Follow Up ICD Dependent:  No  ICD Device Measurements Configuration: LV TIP TO RV COIL  Episodes Coumadin:  Yes  Brady Parameters Mode VVIR     Lower Rate Limit:  70     Rate Response Parameters:  Threshold Auto +1.0, slope 7  Tachy Zones VF:  240     VT:  200     Impression & Recommendations:  Problem # 1:  CARDIOMYOPATHY, PRIMARY, DILATED (ICD-425.4) symptomatically improved.  Class I/II symptoms.  Need to recheck LVEF at this point.   His updated medication list for this problem includes:    Warfarin Sodium 5 Mg Tabs (Warfarin sodium) .Marland KitchenMarland KitchenMarland KitchenMarland Kitchen 5 mg m-w-f, 2.5 mg other days    Aspirin 81 Mg Tbec (Aspirin) ..... One by mouth every day    Furosemide 80 Mg Tabs (Furosemide) .[name]... 1  tab once daily    Toprol Xl 100 Mg Xr24h-tab (Metoprolol succinate) .[name]... Take one tablet by mouth twice a day    Nitrostat 0.4 Mg Subl (Nitroglycerin) .[name]... As needed    Digoxin 0.125 Mg Tabs (Digoxin) .[name]... Take a one tablet by mouth daily  Orders: Echocardiogram (Echo)  Problem # 2:  ATRIAL  FIBRILLATION (ICD-427.31) rate is better controlled at this point.  Tolerating meds better.   His updated medication list for this problem includes:    Warfarin Sodium 5 Mg Tabs (Warfarin sodium) .Marland KitchenMarland KitchenMarland KitchenMarland Kitchen 5 mg m-w-f, 2.5 mg other days    Aspirin 81 Mg Tbec (Aspirin) ..... One by mouth every day    Toprol Xl 100 Mg Xr24h-tab (Metoprolol succinate) .[name]... Take one tablet by mouth twice a day    Digoxin 0.125 Mg Tabs (Digoxin) .[name]... Take a one tablet by mouth daily  His updated medication list for this problem includes:    Warfarin Sodium 5 Mg Tabs (Warfarin sodium) .Marland KitchenMarland KitchenMarland KitchenMarland Kitchen 5 mg m-w-f, 2.5 mg other days    Aspirin 81 Mg Tbec (Aspirin) ..... One by mouth every day    Toprol Xl 100 Mg Xr24h-tab (Metoprolol succinate) .[name]... Take one tablet by mouth twice a day    Nitrostat 0.4 Mg Subl (Nitroglycerin) .[name]... As needed  Problem # 3:  CORONARY ARTERY DISEASE (ICD-414.00) stable at this point.  No definite symptoms. Has non DES in LAD due to need for warfarin. His updated medication list for this problem includes:    Warfarin Sodium 5 Mg Tabs (Warfarin sodium) .Marland KitchenMarland KitchenMarland KitchenMarland Kitchen 5 mg m-w-f, 2.5 mg other days    Aspirin 81 Mg Tbec (Aspirin) ..... One by mouth every day    Toprol Xl 100 Mg Xr24h-tab (Metoprolol succinate) .[name]... Take one tablet by mouth twice a day    Nitrostat 0.4 Mg Subl (Nitroglycerin) .[name]... As needed  Problem # 4:  RENAL DISEASE, CHRONIC, STAGE III (ICD-585.3) Last Cr is elevated at 2.5.  Will recheck and defer to Dr. Cato Mulligan.    Other Orders: TLB-BMP (Basic Metabolic Panel-BMET) (80048-METABOL)  Patient Instructions: 1)  Your physician recommends that you schedule a follow-up appointment in: 4 weeks 2)  Your physician has requested that you have an echocardiogram.  Echocardiography is a painless test that uses sound waves to create images of your heart. It provides your doctor with information about the size and shape of your heart and how well your heart's chambers and valves are working.  This  procedure takes approximately one hour. There are no restrictions for this procedure. 3)  Your physician recommends that you return for lab work in: Today BMET.

## 2010-10-22 NOTE — Medication Information (Signed)
Summary: Order for Diabetic Testing Supplies/[facility] medical  Order for Diabetic Testing Supplies/[facility]   Imported By: Maryln Gottron 10/03/2009 14:09:02  _____________________________________________________________________  External Attachment:    Type:   Image     Comment:   External Document

## 2010-10-22 NOTE — Assessment & Plan Note (Signed)
Summary: pt/njr  Nurse Visit   Allergies: 1)  ! * Betablockers ? Laboratory Results   Blood Tests   Date/Time Received: April 09, 2010 11:41 AM  Date/Time Reported: April 09, 2010 11:41 AM    INR: 1.9   (Normal Range: 0.88-1.12   Therap INR: 2.0-3.5) Comments: Wynona Canes, CMA  April 09, 2010 11:41 AM     Orders Added: 1)  Est. Patient Level I [99211] 2)  Protime [16109UE]  Laboratory Results   Blood Tests      INR: 1.9   (Normal Range: 0.88-1.12   Therap INR: 2.0-3.5) Comments: Wynona Canes, CMA  April 09, 2010 11:41 AM       ANTICOAGULATION RECORD PREVIOUS REGIMEN & LAB RESULTS Anticoagulation Diagnosis:  Atrial Fibrillation on  07/16/2009 Previous INR Goal Range:  2.0-3.0 on  02/24/2008 Previous INR:  2.3 on  03/12/2010 Previous Coumadin Dose(mg):  5mg  on m,w,f 2.5mg  other days on  03/05/2010 Previous Regimen:  same on  03/12/2010 Previous Coagulation Comments:  OV on  06/13/2008  NEW REGIMEN & LAB RESULTS Current INR: 1.9 Current Coumadin Dose(mg): 2.5mg  qd Regimen: same  (no change)       Repeat testing in: 4 weeks MEDICATIONS WARFARIN SODIUM 5 MG TABS (WARFARIN SODIUM) 5 mg M-W-F, 2.5 mg other days ASPIRIN 81 MG  TBEC (ASPIRIN) one by mouth every day FUROSEMIDE 80 MG TABS (FUROSEMIDE) 1  tab once daily HYDRALAZINE HCL 25 MG TABS (HYDRALAZINE HCL) Take 1 tablet by mouth three times a day KLOR-CON M20 20 MEQ CR-TABS (POTASSIUM CHLORIDE CRYS CR) take 2 tablets AM TOPROL XL 100 MG XR24H-TAB (METOPROLOL SUCCINATE) take one tablet by mouth twice a day NITROSTAT 0.4 MG SUBL (NITROGLYCERIN) as needed DIGOXIN 0.125 MG TABS (DIGOXIN) Take a one tablet by mouth daily GLYBURIDE 1.25 MG TABS (GLYBURIDE) Take 1 tablet by mouth once a day with breakfast FREESTYLE LITE TEST  STRP (GLUCOSE BLOOD) check blood sugar daily  DX 250.00 JANUVIA 100 MG TABS (SITAGLIPTIN PHOSPHATE) Take 1 tab by mouth daily ISOSORBIDE MONONITRATE CR 30 MG XR24H-TAB (ISOSORBIDE  MONONITRATE) Take one-half  tablet by mouth daily   Anticoagulation Visit Questionnaire      Coumadin dose missed/changed:  No      Abnormal Bleeding Symptoms:  No   Any diet changes including alcohol intake, vegetables or greens since the last visit:  No Any illnesses or hospitalizations since the last visit:  No Any signs of clotting since the last visit (including chest discomfort, dizziness, shortness of breath, arm tingling, slurred speech, swelling or redness in leg):  No

## 2010-10-22 NOTE — Cardiovascular Report (Signed)
Summary: Office Visit   Office Visit   Imported By: Roderic Ovens 11/09/2009 13:03:23  _____________________________________________________________________  External Attachment:    Type:   Image     Comment:   External Document

## 2010-10-22 NOTE — Progress Notes (Signed)
Summary: Diabetes supply form  Phone Note From Other Clinic   Caller: roberta from [facility] Summary of Call: received form yesterday Rx for diabetes testing supplies but it has the wrong date on it.  For insurance purposes it needs to be 09/18/2009.  Please cross out old date supply new date and initial.  Refax to [phone]  Jenel Lucks [phone] Initial call taken by: Felipa Evener,  September 28, 2009 9:59 AM  Follow-up for Phone Call        date fixed on form and will be refaxed Follow-up by: Gladis Riffle, RN,  September 28, 2009 2:18 PM

## 2010-10-22 NOTE — Assessment & Plan Note (Signed)
Summary: 1 MONTH/PT/NJR   Vital Signs:  Patient profile:   77 year old male Weight:      199 pounds Temp:     97.5 degrees F Pulse rate:   62 / minute Pulse rhythm:   irregular Resp:     12 per minute BP sitting:   146 / 76  (left arm)  Vitals Entered By: Gladis Riffle, RN (October 24, 2009 7:52 AM) CC: 1 month rov Is Patient Diabetic? Yes Did you bring your meter with you today? No   Primary Care Provider:  Birdie Sons MD  CC:  1 month rov.  History of Present Illness:  Follow-Up Visit      This is a 77 year old man who presents for Follow-up visit.  The patient denies chest pain, palpitations, dizziness, syncope, DOE, PND, and orthopnea.  Since the last visit the patient notes no new problems or concerns.  The patient reports taking meds as prescribed.  When questioned about possible medication side effects, the patient notes none.   home cbgs variable---up to 319 started swimming one week ago and highest cbg since then 155  All other systems reviewed and were negative   Preventive Screening-Counseling & Management  Alcohol-Tobacco     Smoking Status: quit > 6 months     Year Started: 1950     Year Quit: 1964  Current Problems (verified): 1)  Implantation of Defibrillator, St Jude Unify  (ICD-V45.02) 2)  Cardiomyopathy, Primary, Dilated  (ICD-425.4) 3)  Coronary Artery Disease  (ICD-414.00) 4)  Coumadin Therapy  (ICD-V58.61) 5)  Atrial Fibrillation  (ICD-427.31) 6)  Congestive Heart Failure  (ICD-428.0) 7)  Hyperlipidemia  (ICD-272.4) 8)  Hypertension  (ICD-401.9) 9)  Diabetes Mellitus, Type II  (ICD-250.00) 10)  Renal Disease, Chronic, Stage Iii  (ICD-585.3) 11)  Encounter For Long-term Use of Other Medications  (ICD-V58.69) 12)  Total Knee Replacement, Hx of  (ICD-V45.89) 13)  Sleep Apnea  (ICD-780.57) 14)  Special Screening For Malignant Neoplasms Colon  (ICD-V76.51) 15)  Hematuria Unspecified  (ICD-599.70) 16)  Encounter For Therapeutic Drug Monitoring   (ICD-V58.83) 17)  Advef, Drug/medicinal/biological Subst Nos  (ICD-995.20) 18)  Prostate Cancer, Hx of  (ICD-V10.46) 19)  Colon Cancer, Hx of  (ICD-V10.05) 20)  Osteoarthritis  (ICD-715.90) 21)  Skin Cancer, Hx of  (ICD-V10.83)  Medications Prior to Update: 1)  Warfarin Sodium 5 Mg Tabs (Warfarin Sodium) .... 5 Mg M-W-F, 2.5 Mg Other Days 2)  Aspirin 81 Mg  Tbec (Aspirin) .... One By Mouth Every Day 3)  Furosemide 80 Mg Tabs (Furosemide) .[name].. 1  Tab Once Daily 4)  Hydralazine Hcl 25 Mg Tabs (Hydralazine Hcl) .... Take 1 Tablet By Mouth Three Times A Day 5)  Klor-Con M20 20 Meq Cr-Tabs (Potassium Chloride Crys Cr) .... Take 2 Tablets Am 6)  Senna S 8.6-50 Mg Tabs (Sennosides-Docusate Sodium) .... One By Mouth Daily 7)  Omeprazole 20 Mg Cpdr (Omeprazole) .... One By Mouth Daily 8)  Toprol Xl 100 Mg Xr24h-Tab (Metoprolol Succinate) .... Take One Tablet By Mouth Twice A Day 9)  Nitrostat 0.4 Mg Subl (Nitroglycerin) .... As Needed 10)  Digoxin 0.125 Mg Tabs (Digoxin) .... Take A Half  Tablet By Mouth Daily 11)  Glyburide 1.25 Mg Tabs (Glyburide) .... Take 1 Tablet By Mouth Once A Day With Breakfast 12)  Freestyle Lite Test  Strp (Glucose Blood) .... Check Blood Sugar Daily  Dx 250.00 13)  Nitrostat 0.4 Mg Subl (Nitroglycerin) .... As Directed 14)  Januvia 100 Mg Tabs (Sitagliptin Phosphate) .... Take 1 Tab By Mouth Daily  Current Medications (verified): 1)  Warfarin Sodium 5 Mg Tabs (Warfarin Sodium) .... 5 Mg M-W-F, 2.5 Mg Other Days 2)  Aspirin 81 Mg  Tbec (Aspirin) .... One By Mouth Every Day 3)  Furosemide 80 Mg Tabs (Furosemide) .[name].. 1  Tab Once Daily 4)  Hydralazine Hcl 25 Mg Tabs (Hydralazine Hcl) .... Take 1 Tablet By Mouth Three Times A Day 5)  Klor-Con M20 20 Meq Cr-Tabs (Potassium Chloride Crys Cr) .... Take 2 Tablets Am 6)  Senna S 8.6-50 Mg Tabs (Sennosides-Docusate Sodium) .... One By Mouth Daily 7)  Omeprazole 20 Mg Cpdr (Omeprazole) .... One By Mouth Daily 8)  Toprol Xl 100  Mg Xr24h-Tab (Metoprolol Succinate) .... Take One Tablet By Mouth Twice A Day 9)  Nitrostat 0.4 Mg Subl (Nitroglycerin) .... As Needed 10)  Digoxin 0.125 Mg Tabs (Digoxin) .... Take A Half  Tablet By Mouth Daily 11)  Glyburide 1.25 Mg Tabs (Glyburide) .... Take 1 Tablet By Mouth Once A Day With Breakfast 12)  Freestyle Lite Test  Strp (Glucose Blood) .... Check Blood Sugar Daily  Dx 250.00 13)  Nitrostat 0.4 Mg Subl (Nitroglycerin) .... As Directed 14)  Januvia 100 Mg Tabs (Sitagliptin Phosphate) .... Take 1 Tab By Mouth Daily  Allergies: 1)  ! * Betablockers ?  Comments:  Nurse/Medical Assistant: 1 month rov, will discuss CBGs with MD  The patient's medications and allergies were reviewed with the patient and were updated in the Medication and Allergy Lists. Gladis Riffle, RN (October 24, 2009 7:53 AM)  Past History:  Past Medical History: Last updated: 08/14/2009 Current Problems:  Implantation of AICD- St. Jude Unify CORONARY ARTERY DISEASE (ICD-414.00)-lad stenting nonischemic cardiomyopathy COUMADIN THERAPY (ICD-V58.61) ATRIAL FIBRILLATION (ICD-427.31) CONGESTIVE HEART FAILURE-chronic systolic HYPERLIPIDEMIA (ICD-272.4) HYPERTENSION (ICD-401.9) DIABETES MELLITUS, TYPE II (ICD-250.00) RENAL DISEASE, CHRONIC, STAGE III (ICD-585.3) ENCOUNTER FOR LONG-TERM USE OF OTHER MEDICATIONS (ICD-V58.69) CONSTIPATION (ICD-564.00) TOTAL KNEE REPLACEMENT, HX OF (ICD-V45.89) HYPERKALEMIA (ICD-276.7) SLEEP APNEA (ICD-780.57) SPECIAL SCREENING FOR MALIGNANT NEOPLASMS COLON (ICD-V76.51) HEMATURIA UNSPECIFIED (ICD-599.70) ENCOUNTER FOR THERAPEUTIC DRUG MONITORING (ICD-V58.83) ADVEF, DRUG/MEDICINAL/BIOLOGICAL SUBST NOS (ICD-995.20) PROSTATE CANCER, HX OF (ICD-V10.46) COLON CANCER, HX OF (ICD-V10.05) OSTEOARTHRITIS (ICD-715.90) SKIN CANCER, HX OF (ICD-V10.83)  Past Surgical History: Last updated: 08/14/2009 Colectomy Prostatectomy Penile prosthesis L eye injury--removed prosthetic  eye Colonoscopy PTCA/stent---2009 Total knee replacement, right 01/11/09 St.  Jude Unify  Family History: Last updated: 04/18/2009 Positive for coronary artery disease on his father, otherwise noncontributory.  Social History: Last updated: 04/18/2009  The patient lives here in [place] with his wife.  He   is retired from [employer].  He quit smoking 15 years ago.  Denies any   recreational or alcohol use.  Tries to follow a heart-healthy diet.  For   exercise, he is actually participating in rehab post knee surgery 2 days   a week and then also does some exercises in range of motion on his own.      Risk Factors: Exercise: yes (07/13/2006)  Risk Factors: Smoking Status: quit > 6 months (10/24/2009)  Review of Systems       All other systems reviewed and were negative   Physical Exam  General:  alert and well-developed.   Head:  normocephalic and atraumatic.   Eyes:  pupils equal and pupils round.   Ears:  R ear normal and L ear normal.   Neck:  supple.  full ROM.   Chest Wall:  no  deformities.   Lungs:  normal respiratory effort and no intercostal retractions.   Heart:  normal rate and regular rhythm.   Abdomen:  soft, non-tender, and normal bowel sounds.   overweight Msk:  No deformity or scoliosis noted of thoracic or lumbar spine.   Pulses:  R radial normal and L radial normal.   Neurologic:  cranial nerves II-XII intact and gait normal.   Skin:  turgor normal and color normal.   Cervical Nodes:  no anterior cervical adenopathy and no posterior cervical adenopathy.   Psych:  memory intact for recent and remote and good eye contact.     Impression & Recommendations:  Problem # 1:  CONGESTIVE HEART FAILURE (ICD-428.0) clinically stable continue current medications  His updated medication list for this problem includes:    Warfarin Sodium 5 Mg Tabs (Warfarin sodium) .Marland KitchenMarland KitchenMarland KitchenMarland Kitchen 5 mg m-w-f, 2.5 mg other days    Aspirin 81 Mg Tbec (Aspirin) ..... One by mouth every  day    Furosemide 80 Mg Tabs (Furosemide) .[name]... 1  tab once daily    Toprol Xl 100 Mg Xr24h-tab (Metoprolol succinate) .[name]... Take one tablet by mouth twice a day    Digoxin 0.125 Mg Tabs (Digoxin) .[name]... Take a half  tablet by mouth daily  LVEF: 15 (06/21/2009)  Echocardiogram: Study Conclusions    1. Left ventricle: The cavity size was normal. Wall thickness was      increased in a pattern of mild LVH. There was mild concentric      hypertrophy. The estimated ejection fraction was 15%. Diffuse      hypokinesis. Probably moderate diastolic dysfunction.   2. Aortic valve: Transvalvular velocity was within the normal range.      There was no stenosis. Mild regurgitation.   3. Mitral valve: Mild to moderate regurgitation.   4. Left atrium: The atrium was mildly dilated.   5. Right ventricle: The cavity size was normal. Systolic function      was mildly reduced.   6. Right atrium: The atrium was mildly dilated.   7. Tricuspid valve: Moderate regurgitation.   8. Pulmonary arteries: PA peak pressure: 47mm Hg (S).   9. Systemic veins: IVC measures 1.9 cm with some respirophasic      variation. Estimated RA pressure was 10 mmHg.   Impressions:    - The patient was in atrial fibrillation during this study. Normal     LV size with severe systolic dysfunction, EF 15%. There is global     hypokinesis. There is moderate diastolic dysfunction. Mild to     moderate mitral regurgitation. Mild pulmonary hypertension. Normal     RV size with mild systolic dysfunction.  (06/21/2009)  Problem # 2:  CORONARY ARTERY DISEASE (ICD-414.00)  no sxs continue current medications  His updated medication list for this problem includes:    Aspirin 81 Mg Tbec (Aspirin) ..... One by mouth every day    Furosemide 80 Mg Tabs (Furosemide) .[name]... 1  tab once daily    Hydralazine Hcl 25 Mg Tabs (Hydralazine hcl) .[name]... Take 1 tablet by mouth three times a day    Toprol Xl 100 Mg Xr24h-tab (Metoprolol succinate)  .[name]... Take one tablet by mouth twice a day    Nitrostat 0.4 Mg Subl (Nitroglycerin) .[name]... As needed    Nitrostat 0.4 Mg Subl (Nitroglycerin) .[name]... As directed  Labs Reviewed: Chol: 178 (08/28/2009)   HDL: 40.70 (08/28/2009)   LDL: 101 (08/28/2009)   TG: 181.0 (08/28/2009)  Lipid Goals: Chol Goal:  200 (08/24/2006)   HDL Goal: 40 (08/24/2006)   LDL Goal: 70 (08/24/2006)   TG Goal: 150 (08/24/2006)  Problem # 3:  DIABETES MELLITUS, TYPE II (ICD-250.00)  stable continue current medications  continue swimming His updated medication list for this problem includes:    Aspirin 81 Mg Tbec (Aspirin) ..... One by mouth every day    Glyburide 1.25 Mg Tabs (Glyburide) .[name]... Take 1 tablet by mouth once a day with breakfast    Januvia 100 Mg Tabs (Sitagliptin phosphate) .[name]... Take 1 tab by mouth daily  Labs Reviewed: Creat: 2.1 (08/28/2009)     Last Eye Exam: normal-pt's report (09/22/2008) Reviewed HgBA1c results: 8.6 (08/28/2009)  6.3 (03/06/2009)  Orders: Protime ([id number])  Problem # 4:  ATRIAL FIBRILLATION (ICD-427.31)  considering ablation rate today on my exam 84 at rest His updated medication list for this problem includes:    Warfarin Sodium 5 Mg Tabs (Warfarin sodium) .Marland KitchenMarland KitchenMarland KitchenMarland Kitchen 5 mg m-w-f, 2.5 mg other days    Aspirin 81 Mg Tbec (Aspirin) ..... One by mouth every day    Toprol Xl 100 Mg Xr24h-tab (Metoprolol succinate) .[name]... Take one tablet by mouth twice a day    Digoxin 0.125 Mg Tabs (Digoxin) .[name]... Take a half  tablet by mouth daily  Reviewed the following: PT: 17.3 (09/26/2009)   INR: 2.0 (09/26/2009) Coumadin Dose (weekly): 25 mg (08/07/2009) Prior Coumadin Dose (weekly): 25 mg (08/07/2009) Next Protime: 08/28/2009 (dated on 08/07/2009)  Orders: Protime (08657QI) TLB-Digoxin (Lanoxin) (80162-DIG)  Problem # 5:  RENAL DISEASE, CHRONIC, STAGE III (ICD-585.3)  check labs today  Labs Reviewed: BUN: 30 (08/28/2009)   Cr: 2.1 (08/28/2009)    Hgb: 14.7 (07/16/2009)   Hct:  43.5 (07/16/2009)   Ca++: 9.0 (08/28/2009)    TP: 6.9 (08/28/2009)   Alb: 3.8 (08/28/2009)  Complete Medication List: 1)  Warfarin Sodium 5 Mg Tabs (Warfarin sodium) .... 5 mg m-w-f, 2.5 mg other days 2)  Aspirin 81 Mg Tbec (Aspirin) .... One by mouth every day 3)  Furosemide 80 Mg Tabs (Furosemide) .[name].. 1  tab once daily 4)  Hydralazine Hcl 25 Mg Tabs (Hydralazine hcl) .... Take 1 tablet by mouth three times a day 5)  Klor-con M20 20 Meq Cr-tabs (Potassium chloride crys cr) .... Take 2 tablets am 6)  Senna S 8.6-50 Mg Tabs (Sennosides-docusate sodium) .... One by mouth daily 7)  Omeprazole 20 Mg Cpdr (Omeprazole) .... One by mouth daily 8)  Toprol Xl 100 Mg Xr24h-tab (Metoprolol succinate) .... Take one tablet by mouth twice a day 9)  Nitrostat 0.4 Mg Subl (Nitroglycerin) .... As needed 10)  Digoxin 0.125 Mg Tabs (Digoxin) .... Take a half  tablet by mouth daily 11)  Glyburide 1.25 Mg Tabs (Glyburide) .... Take 1 tablet by mouth once a day with breakfast 12)  Freestyle Lite Test Strp (Glucose blood) .... Check blood sugar daily  dx 250.00 13)  Nitrostat 0.4 Mg Subl (Nitroglycerin) .... As directed 14)  Januvia 100 Mg Tabs (Sitagliptin phosphate) .... Take 1 tab by mouth daily  Other Orders: Venipuncture (69629) TLB-BMP (Basic Metabolic Panel-BMET) (80048-METABOL) TLB-Lipid Panel (80061-LIPID) TLB-Hepatic/Liver Function Pnl (80076-HEPATIC) TLB-TSH (Thyroid Stimulating Hormone) (52841-LKG)  Patient Instructions: 1)  Please schedule a follow-up appointment in 2 months. 2)  labs one week prior to visit 3)  lipids---272.4 4)  lfts-995.2 5)  bmet-995.2 6)  A1C-250.02 7)      Appended Document: 1 MONTH/PT/NJR   ANTICOAGULATION RECORD PREVIOUS REGIMEN & LAB RESULTS Anticoagulation Diagnosis:  Atrial Fibrillation  on  07/16/2009 Previous INR Goal Range:  2.0-3.0 on  02/24/2008 Previous INR:  2.0 on  09/26/2009 Previous Coumadin Dose(mg):  5mg  on  07/25/2009 Previous Regimen:  same  on  06/08/2009 Previous Coagulation Comments:  OV on  06/13/2008  NEW REGIMEN & LAB RESULTS Current INR: 1.8 Regimen: 5mg . M, W, F, Sat. all others 2.5mg   Repeat testing in: 2 weeks  Anticoagulation Visit Questionnaire Coumadin dose missed/changed:  No Abnormal Bleeding Symptoms:  No  Any diet changes including alcohol intake, vegetables or greens since the last visit:  No Any illnesses or hospitalizations since the last visit:  No Any signs of clotting since the last visit (including chest discomfort, dizziness, shortness of breath, arm tingling, slurred speech, swelling or redness in leg):  No  MEDICATIONS WARFARIN SODIUM 5 MG TABS (WARFARIN SODIUM) 5 mg M-W-F, 2.5 mg other days ASPIRIN 81 MG  TBEC (ASPIRIN) one by mouth every day FUROSEMIDE 80 MG TABS (FUROSEMIDE) 1  tab once daily HYDRALAZINE HCL 25 MG TABS (HYDRALAZINE HCL) Take 1 tablet by mouth three times a day KLOR-CON M20 20 MEQ CR-TABS (POTASSIUM CHLORIDE CRYS CR) take 2 tablets AM SENNA S 8.6-50 MG TABS (SENNOSIDES-DOCUSATE SODIUM) one by mouth daily OMEPRAZOLE 20 MG CPDR (OMEPRAZOLE) one by mouth daily TOPROL XL 100 MG XR24H-TAB (METOPROLOL SUCCINATE) take one tablet by mouth twice a day NITROSTAT 0.4 MG SUBL (NITROGLYCERIN) as needed DIGOXIN 0.125 MG TABS (DIGOXIN) Take a half  tablet by mouth daily GLYBURIDE 1.25 MG TABS (GLYBURIDE) Take 1 tablet by mouth once a day with breakfast FREESTYLE LITE TEST  STRP (GLUCOSE BLOOD) check blood sugar daily  DX 250.00 NITROSTAT 0.4 MG SUBL (NITROGLYCERIN) as directed JANUVIA 100 MG TABS (SITAGLIPTIN PHOSPHATE) Take 1 tab by mouth daily    Laboratory Results   Blood Tests     PT: 16.7 s   (Normal Range: 10.6-13.4)  INR: 1.8   (Normal Range: 0.88-1.12   Therap INR: 2.0-3.5) Comments: Rita Ohara  October 24, 2009 8:23 AM

## 2010-10-22 NOTE — Assessment & Plan Note (Signed)
Summary: pc2   Primary Provider:  Birdie Sons MD  CC:  pacer check 2.  History of Present Illness: [name] is seen in followup for congestive heart failure in the setting of atrial fibrillation with a rapid ventricular response and associated cardiomyopathy with both ischemic and nonischemic components. He is status post CRT implantation with augmented rate control and he comes in today feeling considerably better than he has. There has been no edema and shortness of breath his last overall sense of fatigue is better  He underwent cath in OCT 2010  1. Nonischemic cardiomyopathy with reduced cardiac output and mild       elevated pulmonary pressures.   2. Continued patency of the left anterior descending artery at site of previous stenting  3. Scattered noncritical disease as expected with the patient's       underlying diabetes, but with left ventricular function out of       proportion to the extent of coronary disease.  he shown the table today is whether to proceed with AV junction ablation as we have tried to further augment her rate control. He continues to feel much better overall. He is still thinks that there is room for improvement related to fatigue and some breathlessness   Current Medications (verified): 1)  Warfarin Sodium 5 Mg Tabs (Warfarin Sodium) .... 5 Mg M-W-F, 2.5 Mg Other Days 2)  Aspirin 81 Mg  Tbec (Aspirin) .... One By Mouth Every Day 3)  Furosemide 80 Mg Tabs (Furosemide) .[name].. 1  Tab Once Daily 4)  Hydralazine Hcl 25 Mg Tabs (Hydralazine Hcl) .... Take 1 Tablet By Mouth Three Times A Day 5)  Klor-Con M20 20 Meq Cr-Tabs (Potassium Chloride Crys Cr) .... Take 2 Tablets Am 6)  Senna S 8.6-50 Mg Tabs (Sennosides-Docusate Sodium) .... One By Mouth Daily 7)  Omeprazole 20 Mg Cpdr (Omeprazole) .... One By Mouth Daily 8)  Toprol Xl 100 Mg Xr24h-Tab (Metoprolol Succinate) .... Take One Tablet By Mouth Twice A Day 9)  Nitrostat 0.4 Mg Subl (Nitroglycerin) .... As  Needed 10)  Digoxin 0.125 Mg Tabs (Digoxin) .... Take A Half  Tablet By Mouth Daily 11)  Glyburide 1.25 Mg Tabs (Glyburide) .... Take 1 Tablet By Mouth Once A Day With Breakfast 12)  Freestyle Lite Test  Strp (Glucose Blood) .... Check Blood Sugar Daily  Dx 250.00 13)  Nitrostat 0.4 Mg Subl (Nitroglycerin) .... As Directed  Allergies (verified): 1)  ! * Betablockers ?  Vital Signs:  Patient profile:   77 year old male Height:      72 inches Weight:      197 pounds BMI:     26.81 Pulse rate:   80 / minute Pulse rhythm:   irregular BP sitting:   170 / 90  (left arm) Cuff size:   regular  Vitals Entered By: Judithe Modest CMA (September 25, 2009 9:56 AM)  Physical Exam  General:  The patient was alert and oriented in no acute distress.Neck veins were flat, carotids were brisk. Lungs were clear. Heart sounds were irregular without murmurs or gallops. Abdomen was soft with active bowel sounds. There is no clubbing cyanosis or edema.      ICD Specifications Following MD:  Sherryl Manges, MD     Referring MD:  [facility] ICD Vendor:  [facility]     ICD Model Number:  [id number]     ICD Serial Number:  [id number] ICD DOI:  07/19/2009     ICD Implanting  MD:  Sherryl Manges, MD  Lead 1:    Location: RV     DOI: 07/19/2009     Model #: [id number]     Serial #: [id number]     Status: active Lead 2:    Location: LV     DOI: 07/19/2009     Model #: [id number]     Serial #: [id number]     Status: active  ICD Follow Up Remote Check?  No Battery Voltage:  95% V     Charge Time:  8.5 seconds     Battery Est. Longevity:  8.2 YEARS Underlying rhythm:  AFIB ICD Dependent:  No       ICD Device Measurements Right Ventricle:  Amplitude: 12 mV, Impedance: 630 ohms, Threshold: 0.75 V at 0.4 msec Left Ventricle:  Impedance: 730 ohms, Threshold: 1.0 V at 1.0 msec Configuration: LV TIP TO RV COIL Shock Impedance: 43 ohms   Episodes Shock:  0     ATP:  0     Nonsustained:  0     Ventricular Pacing:  25%  Brady Parameters Mode  VVI     Lower Rate Limit:  60      Tachy Zones VF:  240     VT:  200     Tech Comments:  Normal device function.  RV and LV outputs turned down today, will order Merlin box for patient.  No other changes made.  Question need for AV node ablation as was the plan when pt was in the hospital, heart rates reasonably well controlled now on meds.  ROV per SK. Gypsy Balsam RN BSN  September 25, 2009 10:25 AM   Impression & Recommendations:  Problem # 1:  ATRIAL FIBRILLATION (ICD-427.31)  He remains in atrial fibrillation with a relatively well-controlled ventricular response; although however, in the CRT population biventricular pacing is soft and greater than 90-95% of beats. He is woefully short in the  75% range. This would suggest that there is room for improvement further dysfunctional status if we were to proceed with AV junction ablation as I don't think that there is much room further to go with drug therapy for slowing of his heart rate without resorting to amiodarone  Orders: EKG w/ Interpretation (93000)  Problem # 2:  CONGESTIVE HEART FAILURE (ICD-428.0) as aboven  Problem # 3:  CARDIOMYOPATHY, PRIMARY, DILATED (ICD-425.4) on medical therapy. It would be reasonable to consider the addition of spironolactone. I will defer this tof Dr. Riley Kill His updated medication list for this problem includes:    Warfarin Sodium 5 Mg Tabs (Warfarin sodium) .Marland KitchenMarland KitchenMarland KitchenMarland Kitchen 5 mg m-w-f, 2.5 mg other days    Aspirin 81 Mg Tbec (Aspirin) ..... One by mouth every day    Furosemide 80 Mg Tabs (Furosemide) .[name]... 1  tab once daily    Toprol Xl 100 Mg Xr24h-tab (Metoprolol succinate) .[name]... Take one tablet by mouth twice a day    Nitrostat 0.4 Mg Subl (Nitroglycerin) .[name]... As needed    Digoxin 0.125 Mg Tabs (Digoxin) .[name]... Take a half  tablet by mouth daily    Nitrostat 0.4 Mg Subl (Nitroglycerin) .[name]... As directed

## 2010-10-22 NOTE — Cardiovascular Report (Signed)
Summary: Office Visit   Office Visit   Imported By: Roderic Ovens 10/04/2009 10:23:13  _____________________________________________________________________  External Attachment:    Type:   Image     Comment:   External Document

## 2010-10-22 NOTE — Assessment & Plan Note (Signed)
Summary: device/saf   Visit Type:  ICD-St.Jude Primary Provider:  Birdie Sons MD  CC:  no complaints.  History of Present Illness:  [name] [name] is seen in followup for congestive heart failure in [name] setting of atrial fibrillation with a rapid ventricular response and associated cardiomyopathy with both ischemic and nonischemic components.    He is status post CRT implantation with augmented rate control. There has been no edema and shortness of breath [name] last overall sense of fatigue is better  He underwent cath in OCT 2010  1. Nonischemic cardiomyopathy with reduced cardiac output and mild       elevated pulmonary pressures.   2. Continued patency of [name] left anterior descending artery at site of previous stenting  3. Scattered noncritical disease as expected with [name] [name]'s       underlying diabetes, but with left ventricular function out of       proportion to [name] extent of coronary disease.   Problems Prior to Update: 1)  Blurred Vision  (ICD-368.8) 2)  Encounter For Therapeutic Drug Monitoring  (ICD-V58.83) 3)  Implantation of Defibrillator, St Jude Unify  (ICD-V45.02) 4)  Cardiomyopathy, Primary, Dilated  (ICD-425.4) 5)  Coronary Artery Disease  (ICD-414.00) 6)  Coumadin Therapy  (ICD-V58.61) 7)  Atrial Fibrillation  (ICD-427.31) 8)  Congestive Heart Failure  (ICD-428.0) 9)  Hyperlipidemia  (ICD-272.4) 10)  Hypertension  (ICD-401.9) 11)  Diabetes Mellitus, Type II  (ICD-250.00) 12)  Renal Disease, Chronic, Stage Iii  (ICD-585.3) 13)  Total Knee Replacement, Hx of  (ICD-V45.89) 14)  Sleep Apnea  (ICD-780.57) 15)  Hematuria Unspecified  (ICD-599.70) 16)  Prostate Cancer, Hx of  (ICD-V10.46) 17)  Colon Cancer, Hx of  (ICD-V10.05) 18)  Osteoarthritis  (ICD-715.90) 19)  Skin Cancer, Hx of  (ICD-V10.83)  Current Medications (verified): 1)  Warfarin Sodium 5 Mg Tabs (Warfarin Sodium) .... Take As Directed 2)  Aspirin 81 Mg  Tbec (Aspirin) .... One By Mouth Every  Day 3)  Furosemide 80 Mg Tabs (Furosemide) .... 1/2 Every Other Day Alternating With Take One Tablet By Mouth Every Other Day 4)  Hydralazine Hcl 25 Mg Tabs (Hydralazine Hcl) .... Take 1 Tablet By Mouth Three Times A Day 5)  Klor-Con M20 20 Meq Cr-Tabs (Potassium Chloride Crys Cr) .... Take 2 Tablets Am 6)  Metoprolol Succinate 100 Mg Xr24h-Tab (Metoprolol Succinate) .... Take 1 Tablet By Mouth Two Times A Day 7)  Nitrostat 0.4 Mg Subl (Nitroglycerin) .... As Needed 8)  Lanoxin 0.125 Mg Tabs (Digoxin) .[name].. 1 Tab Daily 9)  Glyburide 1.25 Mg Tabs (Glyburide) .... Take 1 Tablet By Mouth Once A Day With Breakfast 10)  Freestyle Lite Test  Strp (Glucose Blood) .... Check Blood Sugar Daily  Dx 250.00 11)  Isosorbide Mononitrate Cr 30 Mg Xr24h-Tab (Isosorbide Mononitrate) .... Take One-Half  Tablet By Mouth Daily  Allergies (verified): No Known Drug Allergies  Past History:  Past Medical History: Last updated: 08/14/2009 Current Problems:  Implantation of AICD- St. Jude Unify CORONARY ARTERY DISEASE (ICD-414.00)-lad stenting nonischemic cardiomyopathy COUMADIN THERAPY (ICD-V58.61) ATRIAL FIBRILLATION (ICD-427.31) CONGESTIVE HEART FAILURE-chronic systolic HYPERLIPIDEMIA (ICD-272.4) HYPERTENSION (ICD-401.9) DIABETES MELLITUS, TYPE II (ICD-250.00) RENAL DISEASE, CHRONIC, STAGE III (ICD-585.3) ENCOUNTER FOR LONG-TERM USE OF OTHER MEDICATIONS (ICD-V58.69) CONSTIPATION (ICD-564.00) TOTAL KNEE REPLACEMENT, HX OF (ICD-V45.89) HYPERKALEMIA (ICD-276.7) SLEEP APNEA (ICD-780.57) SPECIAL SCREENING FOR MALIGNANT NEOPLASMS COLON (ICD-V76.51) HEMATURIA UNSPECIFIED (ICD-599.70) ENCOUNTER FOR THERAPEUTIC DRUG MONITORING (ICD-V58.83) ADVEF, DRUG/MEDICINAL/BIOLOGICAL SUBST NOS (ICD-995.20) PROSTATE CANCER, HX OF (ICD-V10.46) COLON CANCER, HX OF (ICD-V10.05) OSTEOARTHRITIS (ICD-715.90)  SKIN CANCER, HX OF (ICD-V10.83)  Past Surgical History: Last updated: 08/14/2009 Colectomy Prostatectomy Penile  prosthesis L eye injury--removed prosthetic eye Colonoscopy PTCA/stent---2009 Total knee replacement, right 01/11/09 St.  Jude Unify  Family History: Last updated: 04/18/2009 Positive for coronary artery disease on [name] father, otherwise noncontributory.  Social History: Last updated: 05/07/2010  [name] [name] lives here in [place] with [name] [name].  He   is retired from [employer].  He quit smoking 1968.  Denies any   recreational or alcohol use.  Tries to follow a heart-healthy diet.  For   exercise, he is actually participating in rehab post knee surgery 2 days   a week and then also does some exercises in range of motion on [name] own.      Risk Factors: Exercise: yes (07/13/2006)  Risk Factors: Smoking Status: quit > 6 months (05/07/2010)  Vital Signs:  [name] profile:   77 year old male Height:      72 inches Weight:      196.50 pounds BMI:     26.75 Pulse rate:   70 / minute BP sitting:   146 / 88  (left arm) Cuff size:   regular  Vitals Entered By: Caralee Ates CMA (August 13, 2010 9:27 AM)    ICD Specifications Following MD:  Sherryl Manges, MD     Referring MD:  [facility] ICD Vendor:  St Jude     ICD Model Number:  [phone]     ICD Serial Number:  [id number] ICD DOI:  07/19/2009     ICD Implanting MD:  Sherryl Manges, MD  Lead 1:    Location: RV     DOI: 07/19/2009     Model #: 8119     Serial #: [id number]     Status: active Lead 2:    Location: LV     DOI: 07/19/2009     Model #: 2130     Serial #: [id number]     Status: active  ICD Follow Up Remote Check?  No Charge Time:  8.8 seconds     Battery Est. Longevity:  5.7 years Underlying rhythm:  A-fib ICD Dependent:  No       ICD Device Measurements Right Ventricle:  Amplitude: 5.5 mV, Impedance: 400 ohms, Threshold: 0.75 V at 0.6 msec Left Ventricle:  Impedance: 630 ohms, Threshold: 0.75 V at 1.0 msec Configuration: LV TIP TO RV COIL Shock Impedance: 48 ohms   Episodes Coumadin:  Yes Shock:  0     ATP:  0      Nonsustained:  0     Ventricular Pacing:  73%  Brady Parameters Mode VVIR     Lower Rate Limit:  70     Upper Rate Limit 120 Rate Response Parameters:  Threshold Auto +1.0, slope 7  Tachy Zones VF:  240     VT:  200     Next Cardiology Appt Due:  10/23/2010 Tech Comments:  RV reprogrammed 2.5@0 .6.  A-fib,+ coumadin.  No Merlin @ this time.  ROV 3 months clinic. Altha Harm, LPN  August 13, 2010 9:36 AM   Impression & Recommendations:  Problem # 1:  ATRIAL FIBRILLATION (ICD-427.31) inadequate rate control with only 73% vpacing--see below [name] updated medication list for this problem includes:    Warfarin Sodium 5 Mg Tabs (Warfarin sodium) .[name]... Take as directed    Aspirin 81 Mg Tbec (Aspirin) ..... One by mouth every day    Metoprolol Succinate 100 Mg Xr24h-tab (Metoprolol succinate) .Marland KitchenMarland KitchenMarland KitchenMarland Kitchen  Take 1 tablet by mouth two times a day    Lanoxin 0.125 Mg Tabs (Digoxin) .[name]... 1 tab daily  Problem # 2:  BLURRED VISION (ICD-368.8) thought to be attributable to furosemide-- if this is a drug effect then maybe demadex is a good alternative   [name] suggestion of sprionlactone is prbalby additive given LV dyusfunciton  Problem # 3:  CONGESTIVE HEART FAILURE (ICD-428.0) [name] CHF is beter, but problem emerged with reduction of dose of diuretic and this may well prompt a need ot pursue av ablation to accokmplish adequate rate control which we DO NOT have [name] updated medication list for this problem includes:    Warfarin Sodium 5 Mg Tabs (Warfarin sodium) .[name]... Take as directed    Aspirin 81 Mg Tbec (Aspirin) ..... One by mouth every day    Furosemide 80 Mg Tabs (Furosemide) .[name]... 1/2 every other day alternating with take one tablet by mouth every other day    Metoprolol Succinate 100 Mg Xr24h-tab (Metoprolol succinate) .[name]... Take 1 tablet by mouth two times a day    Nitrostat 0.4 Mg Subl (Nitroglycerin) .[name]... As needed    Lanoxin 0.125 Mg Tabs (Digoxin) .[name]... 1 tab daily    Isosorbide Mononitrate Cr 30  Mg Xr24h-tab (Isosorbide mononitrate) .[name]... Take one-half  tablet by mouth daily  Problem # 4:  CORONARY ARTERY DISEASE (ICD-414.00) stable [name] updated medication list for this problem includes:    Warfarin Sodium 5 Mg Tabs (Warfarin sodium) .[name]... Take as directed    Aspirin 81 Mg Tbec (Aspirin) ..... One by mouth every day    Metoprolol Succinate 100 Mg Xr24h-tab (Metoprolol succinate) .[name]... Take 1 tablet by mouth two times a day    Nitrostat 0.4 Mg Subl (Nitroglycerin) .[name]... As needed    Isosorbide Mononitrate Cr 30 Mg Xr24h-tab (Isosorbide mononitrate) .[name]... Take one-half  tablet by mouth daily  [name] Instructions: 1)  Your physician recommends that you schedule a follow-up appointment in: 3 months with [facility] 2)  Your physician recommends that you continue on your current medications as directed. Please refer to [name] Current Medication list given to you today.  Appended Document: device/saf   [name] Instructions: 1)  Your physician recommends that you continue on your current medications as directed. Please refer to [name] Current Medication list given to you today. 2)  Your physician wants you to follow-up in: 1 year  You will receive a reminder letter in [name] mail two months in advance. If you don't receive a letter, please call our office to schedule [name] follow-up appointment.

## 2010-10-22 NOTE — Assessment & Plan Note (Signed)
Summary: PT/NJR  Nurse Visit   Allergies: 1)  ! * Betablockers ? Laboratory Results   Blood Tests      INR: 2.7   (Normal Range: 0.88-1.12   Therap INR: 2.0-3.5) Comments: Rita Ohara  Feb 12, 2010 10:15 AM     Orders Added: 1)  Est. Patient Level I [99211] 2)  Protime [16109UE]   ANTICOAGULATION RECORD PREVIOUS REGIMEN & LAB RESULTS Anticoagulation Diagnosis:  Atrial Fibrillation on  07/16/2009 Previous INR Goal Range:  2.0-3.0 on  02/24/2008 Previous INR:  2.5 on  01/15/2010 Previous Coumadin Dose(mg):  5mg  on  07/25/2009 Previous Regimen:  same on  01/15/2010 Previous Coagulation Comments:  OV on  06/13/2008  NEW REGIMEN & LAB RESULTS Current INR: 2.7 Regimen: same  Repeat testing in: 4 weeks  Anticoagulation Visit Questionnaire Coumadin dose missed/changed:  No Abnormal Bleeding Symptoms:  No  Any diet changes including alcohol intake, vegetables or greens since the last visit:  No Any illnesses or hospitalizations since the last visit:  No Any signs of clotting since the last visit (including chest discomfort, dizziness, shortness of breath, arm tingling, slurred speech, swelling or redness in leg):  No  MEDICATIONS WARFARIN SODIUM 5 MG TABS (WARFARIN SODIUM) 5 mg M-W-F, 2.5 mg other days ASPIRIN 81 MG  TBEC (ASPIRIN) one by mouth every day FUROSEMIDE 80 MG TABS (FUROSEMIDE) 1  tab once daily HYDRALAZINE HCL 25 MG TABS (HYDRALAZINE HCL) Take 1 tablet by mouth three times a day KLOR-CON M20 20 MEQ CR-TABS (POTASSIUM CHLORIDE CRYS CR) take 2 tablets AM TOPROL XL 100 MG XR24H-TAB (METOPROLOL SUCCINATE) take one tablet by mouth twice a day NITROSTAT 0.4 MG SUBL (NITROGLYCERIN) as needed DIGOXIN 0.125 MG TABS (DIGOXIN) Take a one tablet by mouth daily GLYBURIDE 1.25 MG TABS (GLYBURIDE) Take 1 tablet by mouth once a day with breakfast FREESTYLE LITE TEST  STRP (GLUCOSE BLOOD) check blood sugar daily  DX 250.00 JANUVIA 100 MG TABS (SITAGLIPTIN PHOSPHATE)  Take 1 tab by mouth daily

## 2010-10-22 NOTE — Assessment & Plan Note (Signed)
Summary: ROV   Visit Type:  Follow-up Primary Provider:  Birdie Sons MD  CC:  No complains.  History of Present Illness: Patient seen by Dr. Cato Mulligan.  Patient feels really quite good, better than when he had the knee operation.  Denies chest pain.  Able to get out and about to do things.  He is watching weight closely, and he thinks Januvia is related.    Current Medications (verified): 1)  Warfarin Sodium 5 Mg Tabs (Warfarin Sodium) .... 5 Mg M-W-F, 2.5 Mg Other Days 2)  Aspirin 81 Mg  Tbec (Aspirin) .... One By Mouth Every Day 3)  Furosemide 80 Mg Tabs (Furosemide) .[name].. 1  Tab Once Daily 4)  Hydralazine Hcl 25 Mg Tabs (Hydralazine Hcl) .... Take 1 Tablet By Mouth Three Times A Day 5)  Klor-Con M20 20 Meq Cr-Tabs (Potassium Chloride Crys Cr) .... Take 2 Tablets Am 6)  Toprol Xl 100 Mg Xr24h-Tab (Metoprolol Succinate) .... Take One Tablet By Mouth Twice A Day 7)  Nitrostat 0.4 Mg Subl (Nitroglycerin) .... As Needed 8)  Digoxin 0.125 Mg Tabs (Digoxin) .... Take A One Tablet By Mouth Daily 9)  Glyburide 1.25 Mg Tabs (Glyburide) .... Take 1 Tablet By Mouth Once A Day With Breakfast 10)  Freestyle Lite Test  Strp (Glucose Blood) .... Check Blood Sugar Daily  Dx 250.00 11)  Januvia 100 Mg Tabs (Sitagliptin Phosphate) .... Take 1 Tab By Mouth Daily 12)  Isosorbide Mononitrate Cr 30 Mg Xr24h-Tab (Isosorbide Mononitrate) .... Take One-Half  Tablet By Mouth Daily  Allergies (verified): No Known Drug Allergies  Past History:  Past Medical History: Last updated: 08/14/2009 Current Problems:  Implantation of AICD- St. Jude Unify CORONARY ARTERY DISEASE (ICD-414.00)-lad stenting nonischemic cardiomyopathy COUMADIN THERAPY (ICD-V58.61) ATRIAL FIBRILLATION (ICD-427.31) CONGESTIVE HEART FAILURE-chronic systolic HYPERLIPIDEMIA (ICD-272.4) HYPERTENSION (ICD-401.9) DIABETES MELLITUS, TYPE II (ICD-250.00) RENAL DISEASE, CHRONIC, STAGE III (ICD-585.3) ENCOUNTER FOR LONG-TERM USE OF OTHER  MEDICATIONS (ICD-V58.69) CONSTIPATION (ICD-564.00) TOTAL KNEE REPLACEMENT, HX OF (ICD-V45.89) HYPERKALEMIA (ICD-276.7) SLEEP APNEA (ICD-780.57) SPECIAL SCREENING FOR MALIGNANT NEOPLASMS COLON (ICD-V76.51) HEMATURIA UNSPECIFIED (ICD-599.70) ENCOUNTER FOR THERAPEUTIC DRUG MONITORING (ICD-V58.83) ADVEF, DRUG/MEDICINAL/BIOLOGICAL SUBST NOS (ICD-995.20) PROSTATE CANCER, HX OF (ICD-V10.46) COLON CANCER, HX OF (ICD-V10.05) OSTEOARTHRITIS (ICD-715.90) SKIN CANCER, HX OF (ICD-V10.83)  Past Surgical History: Last updated: 08/14/2009 Colectomy Prostatectomy 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  Jude Unify  Family History: Last updated: 04/18/2009 Positive for coronary artery disease on his father, otherwise noncontributory.  Social History: Last updated: 05/07/2010  The patient lives here in [place] with his wife.  He   is retired from [employer].  He quit smoking 1968.  Denies any   recreational or alcohol use.  Tries to follow a heart-healthy diet.  For   exercise, he is actually participating in rehab post knee surgery 2 days   a week and then also does some exercises in range of motion on his own.      Vital Signs:  Patient profile:   77 year old male Height:      72 inches Weight:      197.75 pounds BMI:     26.92 Pulse rate:   75 / minute Pulse rhythm:   regular Resp:     18 per minute BP sitting:   164 / 90  (left arm) Cuff size:   large  Vitals Entered By: Vikki Ports (June 04, 2010 10:44 AM)  Physical Exam  General:  Well developed, well nourished, in  no acute distress. Head:  normocephalic and atraumatic Eyes:  PERRLA/EOM intact; conjunctiva and lids normal. Chest Wall:  no deformities or breast masses noted Lungs:  Clear bilaterally to auscultation and percussion. Heart:  PMI non displaced.  Normal S1 and S2.  Somewhat irregular.   Abdomen:  Bowel sounds  positive; abdomen soft and non-tender without masses, organomegaly, or hernias noted. No hepatosplenomegaly. Pulses:  pulses normal in all 4 extremities Extremities:  No clubbing or cyanosis. Neurologic:  Alert and oriented x 3.    ICD Specifications Following MD:  Sherryl Manges, MD     Referring MD:  [facility] ICD Vendor:  [facility] Jude     ICD Model Number:  [id number]     ICD Serial Number:  [id number] ICD DOI:  07/19/2009     ICD Implanting MD:  Sherryl Manges, MD  Lead 1:    Location: RV     DOI: 07/19/2009     Model #: 7121     Serial #: [id number]     Status: active Lead 2:    Location: LV     DOI: 07/19/2009     Model #: 4196     Serial #: [id number]     Status: active  ICD Follow Up ICD Dependent:  No       ICD Device Measurements Configuration: LV TIP TO RV COIL  Episodes Coumadin:  Yes  Brady Parameters Mode VVIR     Lower Rate Limit:  70     Upper Rate Limit 120 Rate Response Parameters:  Threshold Auto +1.0, slope 7  Tachy Zones VF:  240     VT:  200     Impression & Recommendations:  Problem # 1:  CORONARY ARTERY DISEASE (ICD-414.00)  Continues to do well at the present time.  Best he has felt in a long time. His updated medication list for this problem includes:    Warfarin Sodium 5 Mg Tabs (Warfarin sodium) .Marland KitchenMarland KitchenMarland KitchenMarland Kitchen 5 mg m-w-f, 2.5 mg other days    Aspirin 81 Mg Tbec (Aspirin) ..... One by mouth every day    Toprol Xl 100 Mg Xr24h-tab (Metoprolol succinate) .[name]... Take one tablet by mouth twice a day    Nitrostat 0.4 Mg Subl (Nitroglycerin) .[name]... As needed    Isosorbide Mononitrate Cr 30 Mg Xr24h-tab (Isosorbide mononitrate) .[name]... Take one-half  tablet by mouth daily  Orders: EKG w/ Interpretation (93000)  Problem # 2:  CARDIOMYOPATHY, PRIMARY, DILATED (ICD-425.4) Last EF is stable.  Good follow up with Dr. Cato Mulligan.  Encouraged close followup with him. His updated medication list for this problem includes:    Warfarin Sodium 5 Mg Tabs (Warfarin sodium) .Marland KitchenMarland KitchenMarland KitchenMarland Kitchen 5 mg m-w-f, 2.5 mg  other days    Aspirin 81 Mg Tbec (Aspirin) ..... One by mouth every day    Furosemide 80 Mg Tabs (Furosemide) .[name]... 1  tab once daily    Toprol Xl 100 Mg Xr24h-tab (Metoprolol succinate) .[name]... Take one tablet by mouth twice a day    Nitrostat 0.4 Mg Subl (Nitroglycerin) .[name]... As needed    Digoxin 0.125 Mg Tabs (Digoxin) .[name]... Take a one tablet by mouth daily    Isosorbide Mononitrate Cr 30 Mg Xr24h-tab (Isosorbide mononitrate) .[name]... Take one-half  tablet by mouth daily  Problem # 3:  ATRIAL FIBRILLATION (ICD-427.31)  rate is controlled.  V pacing. His updated medication list for this problem includes:    Warfarin Sodium 5 Mg Tabs (Warfarin sodium) .Marland KitchenMarland KitchenMarland KitchenMarland Kitchen 5 mg m-w-f, 2.5 mg other days  Aspirin 81 Mg Tbec (Aspirin) ..... One by mouth every day    Toprol Xl 100 Mg Xr24h-tab (Metoprolol succinate) .[name]... Take one tablet by mouth twice a day    Digoxin 0.125 Mg Tabs (Digoxin) .[name]... Take a one tablet by mouth daily  Orders: EKG w/ Interpretation (93000)  Patient Instructions: 1)  Your physician recommends that you continue on your current medications as directed. Please refer to the Current Medication list given to you today. 2)  Your physician wants you to follow-up in:   4 MONTHS. You will receive a reminder letter in the mail two months in advance. If you don't receive a letter, please call our office to schedule the follow-up appointment.

## 2010-10-22 NOTE — Assessment & Plan Note (Signed)
Summary: rov/per paula   Current Medications (verified): 1)  Warfarin Sodium 5 Mg Tabs (Warfarin Sodium) .... 5 Mg M-W-F, 2.5 Mg Other Days 2)  Aspirin 81 Mg  Tbec (Aspirin) .... One By Mouth Every Day 3)  Furosemide 80 Mg Tabs (Furosemide) .[name].. 1  Tab Once Daily 4)  Hydralazine Hcl 25 Mg Tabs (Hydralazine Hcl) .... Take 1 Tablet By Mouth Three Times A Day 5)  Klor-Con M20 20 Meq Cr-Tabs (Potassium Chloride Crys Cr) .... Take 2 Tablets Am 6)  Toprol Xl 100 Mg Xr24h-Tab (Metoprolol Succinate) .... Take One Tablet By Mouth Twice A Day 7)  Nitrostat 0.4 Mg Subl (Nitroglycerin) .... As Needed 8)  Digoxin 0.125 Mg Tabs (Digoxin) .... Take A One Tablet By Mouth Daily 9)  Glyburide 1.25 Mg Tabs (Glyburide) .... Take 1 Tablet By Mouth Once A Day With Breakfast 10)  Freestyle Lite Test  Strp (Glucose Blood) .... Check Blood Sugar Daily  Dx 250.00 11)  Januvia 100 Mg Tabs (Sitagliptin Phosphate) .... Take 1 Tab By Mouth Daily 12)  Isosorbide Mononitrate Cr 30 Mg Xr24h-Tab (Isosorbide Mononitrate) .... Take One-Half  Tablet By Mouth Daily  Allergies (verified): 1)  ! * Betablockers ?    ICD Specifications Following MD:  Sherryl Manges, MD     Referring MD:  [facility] ICD Vendor:  St Jude     ICD Model Number:  [phone]     ICD Serial Number:  [id number] ICD DOI:  07/19/2009     ICD Implanting MD:  Sherryl Manges, MD  Lead 1:    Location: RV     DOI: 07/19/2009     Model #: 7121     Serial #: [id number]     Status: active Lead 2:    Location: LV     DOI: 07/19/2009     Model #: 1660     Serial #: [id number]     Status: active  ICD Follow Up Battery Voltage:  90% V     Charge Time:  8.5 seconds     Battery Est. Longevity:  6.5 yrs Underlying rhythm:  SR ICD Dependent:  No       ICD Device Measurements Right Ventricle:  Amplitude: 12.0 mV, Impedance: 490 ohms, Threshold: 1.25 V at 0.4 msec Left Ventricle:  Impedance: 830 ohms, Threshold: 1.0 V at 1.0 msec Configuration: LV TIP TO RV COIL Shock Impedance:  48 ohms   Episodes MS Episodes:  0     Coumadin:  Yes Shock:  0     ATP:  0     Nonsustained:  0     Atrial Therapies:  0 Ventricular Pacing:  72%  Brady Parameters Mode VVIR     Lower Rate Limit:  70     Upper Rate Limit 120 Rate Response Parameters:  Threshold Auto +1.0, slope 7  Tachy Zones VF:  240     VT:  200     Next Cardiology Appt Due:  07/23/2010 Tech Comments:  NORMAL DEVICE FUNCTION.  NO EPISODES SINCE LAST CHECK.  NO CHANGES MADE.  PT UNABLE TO DO MERLIN CHECKS--NO LANDLINE.  ROV IN 3 MTHS W/DEVICE CLINIC. Vella Kohler  May 09, 2010 9:21 AM

## 2010-10-22 NOTE — Assessment & Plan Note (Signed)
Summary: 4wk f/u   Visit Type:  4 weeks follow up Primary Provider:  Birdie Sons MD  CC:  Headaches and muscle pain since doubling Digoxin.  History of Present Illness: Patient has some headaches and muscles aches since digoxin was increased, and he never has those.  [facility] and [place] BP has been 140/80, and controlled.  His heart rate has been better controlled.  The headache involves the front of the head, but not affecting visual changes.  Current Medications (verified): 1)  Warfarin Sodium 5 Mg Tabs (Warfarin Sodium) .... 5 Mg M-W-F, 2.5 Mg Other Days 2)  Aspirin 81 Mg  Tbec (Aspirin) .... One By Mouth Every Day 3)  Furosemide 80 Mg Tabs (Furosemide) .[name].. 1  Tab Once Daily 4)  Hydralazine Hcl 25 Mg Tabs (Hydralazine Hcl) .... Take 1 Tablet By Mouth Three Times A Day 5)  Klor-Con M20 20 Meq Cr-Tabs (Potassium Chloride Crys Cr) .... Take 2 Tablets Am 6)  Omeprazole 20 Mg Cpdr (Omeprazole) .... One By Mouth Daily 7)  Toprol Xl 100 Mg Xr24h-Tab (Metoprolol Succinate) .... Take One Tablet By Mouth Twice A Day 8)  Nitrostat 0.4 Mg Subl (Nitroglycerin) .... As Needed 9)  Digoxin 0.125 Mg Tabs (Digoxin) .... Take A One Tablet By Mouth Daily 10)  Glyburide 1.25 Mg Tabs (Glyburide) .... Take 1 Tablet By Mouth Once A Day With Breakfast 11)  Freestyle Lite Test  Strp (Glucose Blood) .... Check Blood Sugar Daily  Dx 250.00 12)  Januvia 100 Mg Tabs (Sitagliptin Phosphate) .... Take 1 Tab By Mouth Daily  Allergies: 1)  ! * Betablockers ?  Vital Signs:  Patient profile:   77 year old male Height:      72 inches Weight:      197.50 pounds BMI:     26.88 Pulse rate:   70 / minute Pulse rhythm:   irregular Resp:     18 per minute BP sitting:   154 / 84  (left arm) Cuff size:   large  Vitals Entered By: Vikki Ports (December 26, 2009 10:11 AM)  Physical Exam  General:  Well developed, well nourished, in no acute distress.  Talking coherently Head:  No evidence of  tenderness or head trauma Eyes:  PERRLA/EOM intact; conjunctiva and lids normal. Lungs:  Clear bilaterally to auscultation and percussion. Heart:  Regular rhythm.  Normal S1.  No S4.  Parodox S2 split.  No DM, or SEM. Abdomen:  Bowel sounds positive; abdomen soft and non-tender without masses, organomegaly, or hernias noted. No hepatosplenomegaly. Extremities:  No clubbing or cyanosis. No edema Neurologic:  Alert and oriented x 3.  No obvious focal neurological defects.   EKG  Procedure date:  12/26/2009  Findings:      Atrial fibrillation, with ventricular pacing.   ICD Specifications Following MD:  Sherryl Manges, MD     Referring MD:  [facility] ICD Vendor:  [facility]     ICD Model Number:  [id number]     ICD Serial Number:  [id number] ICD DOI:  07/19/2009     ICD Implanting MD:  Sherryl Manges, MD  Lead 1:    Location: RV     DOI: 07/19/2009     Model #: [id number]     Serial #: [id number]     Status: active Lead 2:    Location: LV     DOI: 07/19/2009     Model #: [id number]     Serial #: [id number]  Status: active  ICD Follow Up ICD Dependent:  No       ICD Device Measurements Configuration: LV TIP TO RV COIL  Episodes Coumadin:  Yes  Brady Parameters Mode VVIR     Lower Rate Limit:  70     Rate Response Parameters:  Threshold Auto +1.0, slope 7  Tachy Zones VF:  240     VT:  200     Impression & Recommendations:  Problem # 1:  HEADACHE (ICD-784.0) Recent onset.  No history of head trauma, or visual change.  Does not seem to change.  Given age, and warfarin with ASA, think we should get CT. Not a candidate for contrast with elevated Cr.  Will check tomorrow.    Problem # 2:  CARDIOMYOPATHY, PRIMARY, DILATED (ICD-425.4) Out of proportion to CAD, although CAD is present, with prior PCI.   His updated medication list for this problem includes:    Warfarin Sodium 5 Mg Tabs (Warfarin sodium) .Marland KitchenMarland KitchenMarland KitchenMarland Kitchen 5 mg m-w-f, 2.5 mg other days    Aspirin 81 Mg Tbec (Aspirin) ..... One by mouth every day     Furosemide 80 Mg Tabs (Furosemide) .[name]... 1  tab once daily    Toprol Xl 100 Mg Xr24h-tab (Metoprolol succinate) .[name]... Take one tablet by mouth twice a day    Nitrostat 0.4 Mg Subl (Nitroglycerin) .[name]... As needed    Digoxin 0.125 Mg Tabs (Digoxin) .[name]... Take a one tablet by mouth daily  Problem # 3:  CORONARY ARTERY DISEASE (ICD-414.00) No current angina, or change in symptoms. His updated medication list for this problem includes:    Warfarin Sodium 5 Mg Tabs (Warfarin sodium) .Marland KitchenMarland KitchenMarland KitchenMarland Kitchen 5 mg m-w-f, 2.5 mg other days    Aspirin 81 Mg Tbec (Aspirin) ..... One by mouth every day    Toprol Xl 100 Mg Xr24h-tab (Metoprolol succinate) .[name]... Take one tablet by mouth twice a day    Nitrostat 0.4 Mg Subl (Nitroglycerin) .[name]... As needed  Problem # 4:  ATRIAL FIBRILLATION (ICD-427.31) rate is better controlled, in pool, etc, but he thinks the increased dig is associated with his symptoms.  Certainly could be given worse Cr. Sees Dr. Cato Mulligan next week. His updated medication list for this problem includes:    Warfarin Sodium 5 Mg Tabs (Warfarin sodium) .Marland KitchenMarland KitchenMarland KitchenMarland Kitchen 5 mg m-w-f, 2.5 mg other days    Aspirin 81 Mg Tbec (Aspirin) ..... One by mouth every day    Toprol Xl 100 Mg Xr24h-tab (Metoprolol succinate) .[name]... Take one tablet by mouth twice a day    Digoxin 0.125 Mg Tabs (Digoxin) .[name]... Take a one tablet by mouth daily  Problem # 5:  HYPERLIPIDEMIA (ICD-272.4)  Problem # 6:  HYPERTENSION (ICD-401.9) better control at present.   His updated medication list for this problem includes:    Aspirin 81 Mg Tbec (Aspirin) ..... One by mouth every day    Furosemide 80 Mg Tabs (Furosemide) .[name]... 1  tab once daily    Hydralazine Hcl 25 Mg Tabs (Hydralazine hcl) .[name]... Take 1 tablet by mouth three times a day    Toprol Xl 100 Mg Xr24h-tab (Metoprolol succinate) .[name]... Take one tablet by mouth twice a day  Other Orders: EKG w/ Interpretation (93000) CT Scan  (CT Scan)  Patient Instructions: 1)  Non-Cardiac CT scanning,  (CAT scanning), is a noninvasive, special x-ray that produces cross-sectional images of the body using x-rays and a computer. CT scans help physicians diagnose and treat medical conditions. For some CT exams, a contrast  material is used to enhance visibility in the area of the body being studied. CT scans provide greater clarity and reveal more details than regular x-ray exams. CT of the head to be scheduled ASAP 2)  Your physician recommends that you schedule a follow-up appointment in: 2 weeks 3)  Your physician recommends that you return for lab work: DIGOXIN Level

## 2010-10-22 NOTE — Miscellaneous (Signed)
Summary: Order for PAP device replacement/[facility]  Order for PAP device replacement/[facility]   Imported By: Sherian Rein 08/06/2010 11:55:06  _____________________________________________________________________  External Attachment:    Type:   Image     Comment:   External Document

## 2010-10-22 NOTE — Progress Notes (Signed)
Summary: Refill  Phone Note Call from Patient Call back at Home Phone [phone]   Caller: Patient Reason for Call: Talk to Nurse Summary of Call: has some questions about his meds Initial call taken by: Migdalia Dk,  October 10, 2009 9:41 AM  Follow-up for Phone Call        Pt called to request Metoprolol Succ refill for new dose.  Pt would like a 3 month supply sent to [facility]. Follow-up by: Julieta Gutting, RN, BSN,  October 10, 2009 10:33 AM    Prescriptions: TOPROL XL 100 MG XR24H-TAB (METOPROLOL SUCCINATE) take one tablet by mouth twice a day  #180 x 3   Entered by:   Julieta Gutting, RN, BSN   Authorized by:   Ronaldo Miyamoto, MD, [facility]   Signed by:   Julieta Gutting, RN, BSN on 10/10/2009   Method used:   Electronically to        [facility] [phone]* (retail)       [address] [place], [place]  [address]       Ph: [phone]       Fax: [phone]   RxID:   [id number]

## 2010-10-22 NOTE — Cardiovascular Report (Signed)
Summary: Office Visit   Office Visit   Imported By: Roderic Ovens 05/15/2010 15:48:44  _____________________________________________________________________  External Attachment:    Type:   Image     Comment:   External Document

## 2010-10-22 NOTE — Assessment & Plan Note (Signed)
Summary: blurred vision//ccm rsc appt time/njr   Vital Signs:  Patient profile:   77 year old male Weight:      195 pounds Temp:     98.3 degrees F oral BP sitting:   190 / 90  (left arm) Cuff size:   regular  Vitals Entered By: Sid Falcon LPN (June 27, 2010 10:30 AM)  Serial Vital Signs/Assessments:  Time      Position  BP       Pulse  Resp  Temp     By                     148/84                         Birdie Sons MD   Primary Care Provider:  Birdie Sons MD   History of Present Illness: Blurred vision used to be intermittent, has been constant for 2 weeks notes trouble with vision---distance He is able to read computer screens but "everything seems lighter" than normal.   Allergies (verified): No Known Drug Allergies  Past History:  Past Medical History: Last updated: 08/14/2009 Current Problems:  Implantation of AICD- [employer] CORONARY ARTERY DISEASE (ICD-414.00)-lad stenting nonischemic cardiomyopathy COUMADIN THERAPY (ICD-V58.61) ATRIAL FIBRILLATION (ICD-427.31) CONGESTIVE HEART FAILURE-chronic systolic HYPERLIPIDEMIA (ICD-272.4) HYPERTENSION (ICD-401.9) DIABETES MELLITUS, TYPE II (ICD-250.00) RENAL DISEASE, CHRONIC, STAGE III (ICD-585.3) ENCOUNTER FOR LONG-TERM USE OF OTHER MEDICATIONS (ICD-V58.69) CONSTIPATION (ICD-564.00) TOTAL KNEE REPLACEMENT, HX OF (ICD-V45.89) HYPERKALEMIA (ICD-276.7) SLEEP APNEA (ICD-780.57) SPECIAL SCREENING FOR MALIGNANT NEOPLASMS COLON (ICD-V76.51) HEMATURIA UNSPECIFIED (ICD-599.70) ENCOUNTER FOR THERAPEUTIC DRUG MONITORING (ICD-V58.83) ADVEF, DRUG/MEDICINAL/BIOLOGICAL SUBST NOS (ICD-995.20) PROSTATE CANCER, HX OF (ICD-V10.46) COLON CANCER, HX OF (ICD-V10.05) OSTEOARTHRITIS (ICD-715.90) SKIN CANCER, HX OF (ICD-V10.83)  Past Surgical History: Last updated: 08/14/2009 Colectomy Prostatectomy 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  Family History: Last updated: 04/18/2009 Positive for coronary artery disease on his father, otherwise noncontributory.  Social History: Last updated: 05/07/2010  The patient lives here in [place] with his wife.  He   is retired from [employer].  He quit smoking 1968.  Denies any   recreational or alcohol use.  Tries to follow a heart-healthy diet.  For   exercise, he is actually participating in rehab post knee surgery 2 days   a week and then also does some exercises in range of motion on his own.      Risk Factors: Exercise: yes (07/13/2006)  Risk Factors: Smoking Status: quit > 6 months (05/07/2010)  Physical Exam  General:  alert and well-developed.   Head:  normocephalic and atraumatic.   Eyes:  pupils equal and pupils round.   Ears:  R ear normal and L ear normal.   Neck:  No deformities, masses, or tenderness noted. Lungs:  normal respiratory effort and no accessory muscle use.   Heart:  normal rate and regular rhythm.   Abdomen:  soft, non-tender, and normal bowel sounds.   overweight Skin:  turgor normal and color normal.   Psych:  normally interactive and good eye contact.     Impression & Recommendations:  Problem # 1:  BLURRED VISION (ICD-368.8) ? cause needs further eval refer to ophthal Orders: Ophthalmology Referral (Ophthalmology)  Complete Medication List: 1)  Warfarin Sodium 5 Mg Tabs (Warfarin sodium) .... 5 mg m-f, 2.5 mg other days 2)  Aspirin 81 Mg Tbec (Aspirin) .... One by  mouth every day 3)  Furosemide 80 Mg Tabs (Furosemide) .[name].. 1  tab once daily 4)  Hydralazine Hcl 25 Mg Tabs (Hydralazine hcl) .... Take 1 tablet by mouth three times a day 5)  Klor-con M20 20 Meq Cr-tabs (Potassium chloride crys cr) .... Take 2 tablets am 6)  Toprol Xl 100 Mg Xr24h-tab (Metoprolol succinate) .... Take one tablet by mouth twice a day 7)  Nitrostat 0.4 Mg Subl (Nitroglycerin) .... As needed 8)  Digoxin 0.125 Mg Tabs (Digoxin)  .... Take a one tablet by mouth daily 9)  Glyburide 1.25 Mg Tabs (Glyburide) .... Take 1 tablet by mouth once a day with breakfast 10)  Freestyle Lite Test Strp (Glucose blood) .... Check blood sugar daily  dx 250.00 11)  Januvia 100 Mg Tabs (Sitagliptin phosphate) .... Take 1 tab by mouth daily 12)  Isosorbide Mononitrate Cr 30 Mg Xr24h-tab (Isosorbide mononitrate) .... Take one-half  tablet by mouth daily  Other Orders: Flu Vaccine 26yrs + MEDICARE PATIENTS (J4782) Administration Flu vaccine - MCR (N5621)    Flu Vaccine Consent Questions     Do you have a history of severe allergic reactions to this vaccine? no    Any prior history of allergic reactions to egg and/or gelatin? no    Do you have a sensitivity to the preservative Thimersol? no    Do you have a past history of Guillan-Barre Syndrome? no    Do you currently have an acute febrile illness? no    Have you ever had a severe reaction to latex? no    Vaccine information given and explained to patient? yes    Are you currently pregnant? no    Lot Number:[id number]   Exp Date:03/22/2011   Site Given  Left Deltoid IMdflu

## 2010-10-22 NOTE — Assessment & Plan Note (Signed)
Summary: 2 MONTH ROV/NJR--face to face re-evaluation after CPAP set-up.Marland KitchenMarland Kitchen   Vital Signs:  Patient profile:   77 year old male Weight:      197 pounds Temp:     98.3 degrees F oral Pulse rate:   70 / minute BP sitting:   160 / 92  (left arm) Cuff size:   regular  Vitals Entered By: Kathrynn Speed CMA (May 07, 2010 11:10 AM) CC: 2 months rov, src Is Patient Diabetic? Yes   Primary Care Provider:  Birdie Sons MD  CC:  2 months rov and src.  History of Present Illness:  Follow-Up Visit      This is a 77 year old man who presents for Follow-up visit.  The patient denies chest pain, palpitations, edema, and SOB.  Since the last visit the patient notes no new problems or concerns.  The patient reports taking meds as prescribed.  When questioned about possible medication side effects, the patient notes none and cramping.    All other systems reviewed and were negative   Preventive Screening-Counseling & Management  Alcohol-Tobacco     Smoking Status: quit > 6 months     Year Started: 1950     Year Quit: 1964  Current Medications (verified): 1)  Warfarin Sodium 5 Mg Tabs (Warfarin Sodium) .... 5 Mg M-W-F, 2.5 Mg Other Days 2)  Aspirin 81 Mg  Tbec (Aspirin) .... One By Mouth Every Day 3)  Furosemide 80 Mg Tabs (Furosemide) .[name].. 1  Tab Once Daily 4)  Hydralazine Hcl 25 Mg Tabs (Hydralazine Hcl) .... Take 1 Tablet By Mouth Three Times A Day 5)  Klor-Con M20 20 Meq Cr-Tabs (Potassium Chloride Crys Cr) .... Take 2 Tablets Am 6)  Toprol Xl 100 Mg Xr24h-Tab (Metoprolol Succinate) .... Take One Tablet By Mouth Twice A Day 7)  Nitrostat 0.4 Mg Subl (Nitroglycerin) .... As Needed 8)  Digoxin 0.125 Mg Tabs (Digoxin) .... Take A One Tablet By Mouth Daily 9)  Glyburide 1.25 Mg Tabs (Glyburide) .... Take 1 Tablet By Mouth Once A Day With Breakfast 10)  Freestyle Lite Test  Strp (Glucose Blood) .... Check Blood Sugar Daily  Dx 250.00 11)  Januvia 100 Mg Tabs (Sitagliptin Phosphate) ....  Take 1 Tab By Mouth Daily 12)  Isosorbide Mononitrate Cr 30 Mg Xr24h-Tab (Isosorbide Mononitrate) .... Take One-Half  Tablet By Mouth Daily  Allergies (verified): 1)  ! * Betablockers ?  Past History:  Past Medical History: Last updated: 08/14/2009 Current Problems:  Implantation of AICD- [employer] CORONARY ARTERY DISEASE (ICD-414.00)-lad stenting nonischemic cardiomyopathy COUMADIN THERAPY (ICD-V58.61) ATRIAL FIBRILLATION (ICD-427.31) CONGESTIVE HEART FAILURE-chronic systolic HYPERLIPIDEMIA (ICD-272.4) HYPERTENSION (ICD-401.9) DIABETES MELLITUS, TYPE II (ICD-250.00) RENAL DISEASE, CHRONIC, STAGE III (ICD-585.3) ENCOUNTER FOR LONG-TERM USE OF OTHER MEDICATIONS (ICD-V58.69) CONSTIPATION (ICD-564.00) TOTAL KNEE REPLACEMENT, HX OF (ICD-V45.89) HYPERKALEMIA (ICD-276.7) SLEEP APNEA (ICD-780.57) SPECIAL SCREENING FOR MALIGNANT NEOPLASMS COLON (ICD-V76.51) HEMATURIA UNSPECIFIED (ICD-599.70) ENCOUNTER FOR THERAPEUTIC DRUG MONITORING (ICD-V58.83) ADVEF, DRUG/MEDICINAL/BIOLOGICAL SUBST NOS (ICD-995.20) PROSTATE CANCER, HX OF (ICD-V10.46) COLON CANCER, HX OF (ICD-V10.05) OSTEOARTHRITIS (ICD-715.90) SKIN CANCER, HX OF (ICD-V10.83)  Past Surgical History: Last updated: 08/14/2009 Colectomy Prostatectomy 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  Family History: Last updated: 04/18/2009 Positive for coronary artery disease on his father, otherwise noncontributory.  Social History: Last updated: 05/07/2010  The patient lives here in [place] with his wife.  He   is retired from [employer].  He quit smoking 1968.  Denies any   recreational or alcohol use.  Tries to follow a heart-healthy diet.  For   exercise, he is actually participating in rehab post knee surgery 2 days   a week and then also does some exercises in range of motion on his own.      Risk  Factors: Exercise: yes (07/13/2006)  Risk Factors: Smoking Status: quit > 6 months (05/07/2010)  Social History:  The patient lives here in [place] with his wife.  He   is retired from [employer].  He quit smoking 1968.  Denies any   recreational or alcohol use.  Tries to follow a heart-healthy diet.  For   exercise, he is actually participating in rehab post knee surgery 2 days   a week and then also does some exercises in range of motion on his own.      Physical Exam  General:  alert and well-developed.   Head:  normocephalic and atraumatic.   Eyes:  pupils equal and pupils round.   Ears:  R ear normal and L ear normal.   Neck:  No deformities, masses, or tenderness noted. Chest Wall:  no deformities.   Lungs:  normal respiratory effort and no accessory muscle use.   Heart:  normal rate and regular rhythm.   Abdomen:  soft, non-tender, and normal bowel sounds.   overweight Msk:  No deformity or scoliosis noted of thoracic or lumbar spine.   Pulses:  R radial normal and L radial normal.   Neurologic:  cranial nerves II-XII intact and gait normal.   Skin:  turgor normal and color normal.   Psych:  normally interactive and good eye contact.     Impression & Recommendations:  Problem # 1:  CORONARY ARTERY DISEASE (ICD-414.00)  no sxs continue current medications  His updated medication list for this problem includes:    Aspirin 81 Mg Tbec (Aspirin) ..... One by mouth every day    Furosemide 80 Mg Tabs (Furosemide) .[name]... 1  tab once daily    Hydralazine Hcl 25 Mg Tabs (Hydralazine hcl) .[name]... Take 1 tablet by mouth three times a day    Toprol Xl 100 Mg Xr24h-tab (Metoprolol succinate) .[name]... Take one tablet by mouth twice a day    Nitrostat 0.4 Mg Subl (Nitroglycerin) .[name]... As needed    Isosorbide Mononitrate Cr 30 Mg Xr24h-tab (Isosorbide mononitrate) .[name]... Take one-half  tablet by mouth daily  Labs Reviewed: Chol: 177 (04/30/2010)   HDL: 34.90 (04/30/2010)   LDL: 110  (04/30/2010)   TG: 159.0 (04/30/2010)  Lipid Goals: Chol Goal: 200 (08/24/2006)   HDL Goal: 40 (08/24/2006)   LDL Goal: 70 (08/24/2006)   TG Goal: 150 (08/24/2006)  Problem # 2:  ATRIAL FIBRILLATION (ICD-427.31) no complications and rate controlled (sounds as in sinus rhythm) continue current medications  His updated medication list for this problem includes:    Warfarin Sodium 5 Mg Tabs (Warfarin sodium) .Marland KitchenMarland KitchenMarland KitchenMarland Kitchen 5 mg m-w-f, 2.5 mg other days    Aspirin 81 Mg Tbec (Aspirin) ..... One by mouth every day    Toprol Xl 100 Mg Xr24h-tab (Metoprolol succinate) .[name]... Take one tablet by mouth twice a day    Digoxin 0.125 Mg Tabs (Digoxin) .[name]... Take a one tablet by mouth daily  Orders: Protime (16109UE) Fingerstick (45409)  Problem # 3:  DIABETES MELLITUS, TYPE II (ICD-250.00) reasonable control continue current medications  His updated medication list for this problem includes:    Aspirin 81 Mg Tbec (Aspirin) ..... One by  mouth every day    Glyburide 1.25 Mg Tabs (Glyburide) .[name]... Take 1 tablet by mouth once a day with breakfast    Januvia 100 Mg Tabs (Sitagliptin phosphate) .[name]... Take 1 tab by mouth daily  Labs Reviewed: Creat: 2.4 (04/30/2010)     Last Eye Exam: normal-pt's report (09/22/2009) Reviewed HgBA1c results: 7.7 (04/30/2010)  7.2 (12/25/2009)  Problem # 4:  CONGESTIVE HEART FAILURE (ICD-428.0)  no sxs continue current medications  His updated medication list for this problem includes:    Warfarin Sodium 5 Mg Tabs (Warfarin sodium) .Marland KitchenMarland KitchenMarland KitchenMarland Kitchen 5 mg m-w-f, 2.5 mg other days    Aspirin 81 Mg Tbec (Aspirin) ..... One by mouth every day    Furosemide 80 Mg Tabs (Furosemide) .[name]... 1  tab once daily    Toprol Xl 100 Mg Xr24h-tab (Metoprolol succinate) .[name]... Take one tablet by mouth twice a day    Digoxin 0.125 Mg Tabs (Digoxin) .[name]... Take a one tablet by mouth daily  LVEF: 15 (06/21/2009)  Echocardiogram:  Study Conclusions    - Left ventricle: The cavity size was normal. Wall  thickness was     increased in a pattern of mild LVH. Systolic function was     moderately to severely reduced. The estimated ejection fraction     was in the range of 30% to 35%. Diffuse hypokinesis.   - Aortic valve: Mild regurgitation. Valve area: 2.13cm^2(VTI). Valve     area: 2.3cm^2 (Vmax).   - Mitral valve: Mild regurgitation.   - Left atrium: The atrium was moderately dilated.   - Right atrium: The atrium was moderately dilated.   - Pulmonary arteries: Systolic pressure was mildly increased.   Prepared and Electronically Authenticated by    Olga Millers, MD, [facility] (01/09/2010)  Complete Medication List: 1)  Warfarin Sodium 5 Mg Tabs (Warfarin sodium) .... 5 mg m-w-f, 2.5 mg other days 2)  Aspirin 81 Mg Tbec (Aspirin) .... One by mouth every day 3)  Furosemide 80 Mg Tabs (Furosemide) .[name].. 1  tab once daily 4)  Hydralazine Hcl 25 Mg Tabs (Hydralazine hcl) .... Take 1 tablet by mouth three times a day 5)  Klor-con M20 20 Meq Cr-tabs (Potassium chloride crys cr) .... Take 2 tablets am 6)  Toprol Xl 100 Mg Xr24h-tab (Metoprolol succinate) .... Take one tablet by mouth twice a day 7)  Nitrostat 0.4 Mg Subl (Nitroglycerin) .... As needed 8)  Digoxin 0.125 Mg Tabs (Digoxin) .... Take a one tablet by mouth daily 9)  Glyburide 1.25 Mg Tabs (Glyburide) .... Take 1 tablet by mouth once a day with breakfast 10)  Freestyle Lite Test Strp (Glucose blood) .... Check blood sugar daily  dx 250.00 11)  Januvia 100 Mg Tabs (Sitagliptin phosphate) .... Take 1 tab by mouth daily 12)  Isosorbide Mononitrate Cr 30 Mg Xr24h-tab (Isosorbide mononitrate) .... Take one-half  tablet by mouth daily  Patient Instructions: 1)  4 months 2)  labs one week prior to visit 3)  lipids---272.4 4)  lfts-995.2 5)  bmet-995.2 6)  A1C-250.02 7)    8)  coumadin 2.5 mg by mouth once daily except friday take 5 mg.   Laboratory Results   Blood Tests   Date/Time Recieved: May 07, 2010 10:38 AM  Date/Time  Reported: May 07, 2010 10:38 AM    INR: 1.8   (Normal Range: 0.88-1.12   Therap INR: 2.0-3.5) Comments: Wynona Canes, CMA  May 07, 2010 10:38 AM       ANTICOAGULATION RECORD PREVIOUS REGIMEN &  LAB RESULTS Anticoagulation Diagnosis:  Atrial Fibrillation on  07/16/2009 Previous INR Goal Range:  2.0-3.0 on  02/24/2008 Previous INR:  1.9 on  04/09/2010 Previous Coumadin Dose(mg):  2.5mg  qd on  04/09/2010 Previous Regimen:  same on  03/12/2010 Previous Coagulation Comments:  OV on  06/13/2008  NEW REGIMEN & LAB RESULTS Current INR: 1.8 Regimen: same  (no change)  MEDICATIONS WARFARIN SODIUM 5 MG TABS (WARFARIN SODIUM) 5 mg M-W-F, 2.5 mg other days ASPIRIN 81 MG  TBEC (ASPIRIN) one by mouth every day FUROSEMIDE 80 MG TABS (FUROSEMIDE) 1  tab once daily HYDRALAZINE HCL 25 MG TABS (HYDRALAZINE HCL) Take 1 tablet by mouth three times a day KLOR-CON M20 20 MEQ CR-TABS (POTASSIUM CHLORIDE CRYS CR) take 2 tablets AM TOPROL XL 100 MG XR24H-TAB (METOPROLOL SUCCINATE) take one tablet by mouth twice a day NITROSTAT 0.4 MG SUBL (NITROGLYCERIN) as needed DIGOXIN 0.125 MG TABS (DIGOXIN) Take a one tablet by mouth daily GLYBURIDE 1.25 MG TABS (GLYBURIDE) Take 1 tablet by mouth once a day with breakfast FREESTYLE LITE TEST  STRP (GLUCOSE BLOOD) check blood sugar daily  DX 250.00 JANUVIA 100 MG TABS (SITAGLIPTIN PHOSPHATE) Take 1 tab by mouth daily ISOSORBIDE MONONITRATE CR 30 MG XR24H-TAB (ISOSORBIDE MONONITRATE) Take one-half  tablet by mouth daily   Anticoagulation Visit Questionnaire      Coumadin dose missed/changed:  No      Abnormal Bleeding Symptoms:  No   Any diet changes including alcohol intake, vegetables or greens since the last visit:  No Any illnesses or hospitalizations since the last visit:  No Any signs of clotting since the last visit (including chest discomfort, dizziness, shortness of breath, arm tingling, slurred speech, swelling or redness in leg):  No

## 2010-10-22 NOTE — Assessment & Plan Note (Signed)
Summary: BLURRED VISION FOR OVER A MONTH/? ALLERGIC REACTION TO MED/CJR   Vital Signs:  Patient profile:   77 year old male Weight:      196 pounds Temp:     98.2 degrees F oral Pulse rhythm:   regular Resp:     12 per minute BP sitting:   156 / 72  Vitals Entered By: Lynann Beaver CMA (July 09, 2010 11:49 AM) CC: Blurred vision and questions about meds Is Patient Diabetic? Yes Pain Assessment Patient in pain? no        Primary Care Provider:  Birdie Sons MD  CC:  Blurred vision and questions about meds.  History of Present Illness: patient comes in to discuss blurred vision. His symptoms are vague. He actually sees quite sharply but states that he has a sensation of a film over his eyes. He has had ophthalmologic evaluation. He readily admits that he has floaters but this seems somehow different to him. He's been told that he does not have significant cataracts or retinal issues. He denies any other neurologic complaints.  In regards to congestive heart failure he has done quite well. He continues to work for to 8 hours daily. He denies any chest pain, PND, orthopnea, shortness of breath. With vigorous exertion he does have some dyspnea. No other complaints in a complete review of systems.  Current Medications (verified): 1)  Warfarin Sodium 5 Mg Tabs (Warfarin Sodium) .... 5 Mg M-F, 2.5 Mg Other Days 2)  Aspirin 81 Mg  Tbec (Aspirin) .... One By Mouth Every Day 3)  Furosemide 80 Mg Tabs (Furosemide) .[name].. 1  Tab Once Daily 4)  Hydralazine Hcl 25 Mg Tabs (Hydralazine Hcl) .... Take 1 Tablet By Mouth Three Times A Day 5)  Klor-Con M20 20 Meq Cr-Tabs (Potassium Chloride Crys Cr) .... Take 2 Tablets Am 6)  Toprol Xl 100 Mg Xr24h-Tab (Metoprolol Succinate) .... Take One Tablet By Mouth Twice A Day 7)  Nitrostat 0.4 Mg Subl (Nitroglycerin) .... As Needed 8)  Digoxin 0.125 Mg Tabs (Digoxin) .... Take A One Tablet By Mouth Daily 9)  Glyburide 1.25 Mg Tabs (Glyburide) .... Take 1  Tablet By Mouth Once A Day With Breakfast 10)  Freestyle Lite Test  Strp (Glucose Blood) .... Check Blood Sugar Daily  Dx 250.00 11)  Januvia 100 Mg Tabs (Sitagliptin Phosphate) .... Take 1 Tab By Mouth Daily 12)  Isosorbide Mononitrate Cr 30 Mg Xr24h-Tab (Isosorbide Mononitrate) .... Take One-Half  Tablet By Mouth Daily  Allergies (verified): No Known Drug Allergies  Past History:  Past Medical History: Last updated: 08/14/2009 Current Problems:  Implantation of AICD- [employer] CORONARY ARTERY DISEASE (ICD-414.00)-lad stenting nonischemic cardiomyopathy COUMADIN THERAPY (ICD-V58.61) ATRIAL FIBRILLATION (ICD-427.31) CONGESTIVE HEART FAILURE-chronic systolic HYPERLIPIDEMIA (ICD-272.4) HYPERTENSION (ICD-401.9) DIABETES MELLITUS, TYPE II (ICD-250.00) RENAL DISEASE, CHRONIC, STAGE III (ICD-585.3) ENCOUNTER FOR LONG-TERM USE OF OTHER MEDICATIONS (ICD-V58.69) CONSTIPATION (ICD-564.00) TOTAL KNEE REPLACEMENT, HX OF (ICD-V45.89) HYPERKALEMIA (ICD-276.7) SLEEP APNEA (ICD-780.57) SPECIAL SCREENING FOR MALIGNANT NEOPLASMS COLON (ICD-V76.51) HEMATURIA UNSPECIFIED (ICD-599.70) ENCOUNTER FOR THERAPEUTIC DRUG MONITORING (ICD-V58.83) ADVEF, DRUG/MEDICINAL/BIOLOGICAL SUBST NOS (ICD-995.20) PROSTATE CANCER, HX OF (ICD-V10.46) COLON CANCER, HX OF (ICD-V10.05) OSTEOARTHRITIS (ICD-715.90) SKIN CANCER, HX OF (ICD-V10.83)  Past Surgical History: Last updated: 08/14/2009 Colectomy Prostatectomy 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  Family History: Last updated: 04/18/2009 Positive for coronary artery disease on his father, otherwise noncontributory.  Social History: Last updated: 05/07/2010  The patient lives here in  [place] with his wife.  He   is retired from [employer].  He quit smoking 1968.  Denies any   recreational or alcohol use.  Tries to follow a heart-healthy diet.   For   exercise, he is actually participating in rehab post knee surgery 2 days   a week and then also does some exercises in range of motion on his own.      Risk Factors: Exercise: yes (07/13/2006)  Risk Factors: Smoking Status: quit > 6 months (05/07/2010)  Physical Exam  General:  well-developed well-nourished male in no acute distress. HEENT exam atraumatic, normocephalic symmetric her muscles are intact. Neck is supple. Chest without increased work or breathing. Cardiac exam S1-S2 are regular. Abdominal exam active bowel sounds, soft. Extremities there is no clubbing cyanosis or edema. Jugular venous distention is normal.   Impression & Recommendations:  Problem # 1:  BLURRED VISION (ICD-368.8) unclear etiology. I think he should followup with ophthalmology. I don't think there is any medical problems at this time. It's worth checking a digoxin level. Reviewed.  Problem # 2:  HYPERTENSION (ICD-401.9) he will monitor at home. His blood pressures at home and been in the 120 140/60-70 range. His updated medication list for this problem includes:    Furosemide 80 Mg Tabs (Furosemide) .[name]... 1/2 -1  tab once daily    Hydralazine Hcl 25 Mg Tabs (Hydralazine hcl) .[name]... Take 1 tablet by mouth three times a day    Metoprolol Succinate 100 Mg Xr24h-tab (Metoprolol succinate) .[name]... Take 1 tablet by mouth two times a day  BP today: 156/72 Prior BP: 190/90 (06/27/2010)  Prior 10 Yr Risk Heart Disease: N/A (08/24/2006)  Labs Reviewed: K+: 3.5 (04/30/2010) Creat: : 2.4 (04/30/2010)   Chol: 177 (04/30/2010)   HDL: 34.90 (04/30/2010)   LDL: 110 (04/30/2010)   TG: 159.0 (04/30/2010)  Problem # 3:  RENAL DISEASE, CHRONIC, STAGE III (ICD-585.3) stable. Will require frequent followup.  Complete Medication List: 1)  Warfarin Sodium 5 Mg Tabs (Warfarin sodium) .... Take as directed 2)  Aspirin 81 Mg Tbec (Aspirin) .... One by mouth every day 3)  Furosemide 80 Mg Tabs (Furosemide) .... 1/2 -1   tab once daily 4)  Hydralazine Hcl 25 Mg Tabs (Hydralazine hcl) .... Take 1 tablet by mouth three times a day 5)  Klor-con M20 20 Meq Cr-tabs (Potassium chloride crys cr) .... Take 2 tablets am 6)  Metoprolol Succinate 100 Mg Xr24h-tab (Metoprolol succinate) .... Take 1 tablet by mouth two times a day 7)  Nitrostat 0.4 Mg Subl (Nitroglycerin) .... As needed 8)  Lanoxin 0.125 Mg Tabs (Digoxin) .[name].. 1 tab daily 9)  Glyburide 1.25 Mg Tabs (Glyburide) .... Take 1 tablet by mouth once a day with breakfast 10)  Freestyle Lite Test Strp (Glucose blood) .... Check blood sugar daily  dx 250.00 11)  Isosorbide Mononitrate Cr 30 Mg Xr24h-tab (Isosorbide mononitrate) .... Take one-half  tablet by mouth daily  Other Orders: Venipuncture (16109) T-Digoxin (60454-09811) TLB-BMP (Basic Metabolic Panel-BMET) (80048-METABOL) TLB-CBC Platelet - w/Differential (85025-CBCD)  Patient Instructions: 1)  Please schedule a follow-up appointment in 1 month. Prescriptions: LANOXIN 0.125 MG TABS (DIGOXIN) 1 tab daily  #90 x 3   Entered and Authorized by:   Birdie Sons MD   Signed by:   Birdie Sons MD on 07/09/2010   Method used:   Electronically to        [facility] [id number] (retail)       [address]  [place], [place]  [address]       Ph: [phone]       Fax: [phone]   RxID:   [id number]    Orders Added: 1)  Venipuncture [93235] 2)  T-Digoxin [[id number]] 3)  Est. Patient Level IV [70623] 4)  TLB-BMP (Basic Metabolic Panel-BMET) [80048-METABOL] 5)  TLB-CBC Platelet - w/Differential [85025-CBCD]

## 2010-10-24 ENCOUNTER — Ambulatory Visit: Payer: Medicare Other | Attending: Internal Medicine | Admitting: Dietician

## 2010-10-24 DIAGNOSIS — Z713 Dietary counseling and surveillance: Secondary | ICD-10-CM | POA: Insufficient documentation

## 2010-10-24 DIAGNOSIS — E119 Type 2 diabetes mellitus without complications: Secondary | ICD-10-CM | POA: Insufficient documentation

## 2010-10-24 NOTE — Assessment & Plan Note (Signed)
Summary: f31m   Visit Type:  Follow-up Primary Provider:  Birdie Sons MD  CC:  Blurry vision- Less energy .  History of Present Illness: Developed some dizziness, and funnyu vision.  Went to see opthalmology and assured vision was ok.  Thought is was related to the meds, and cut down his furosemide.  Talked with Dr. Cato Mulligan about switching to demadex.  Otherwise doing ok.  Aware of renal function, and he has been getting checked regularly by Dr. Cato Mulligan.  Discussed ASA treatment with Dr. Graciela Husbands.  Feeling less energetic since starting spironolactone.    Problems Prior to Update: 1)  Blurred Vision  (ICD-368.8) 2)  Encounter For Therapeutic Drug Monitoring  (ICD-V58.83) 3)  Implantation of Defibrillator, [employer] Jude Unify  (ICD-V45.02) 4)  Cardiomyopathy, Primary, Dilated  (ICD-425.4) 5)  Coronary Artery Disease  (ICD-414.00) 6)  Coumadin Therapy  (ICD-V58.61) 7)  Atrial Fibrillation  (ICD-427.31) 8)  Congestive Heart Failure  (ICD-428.0) 9)  Hyperlipidemia  (ICD-272.4) 10)  Hypertension  (ICD-401.9) 11)  Diabetes Mellitus, Type II  (ICD-250.00) 12)  Renal Disease, Chronic, Stage Iii  (ICD-585.3) 13)  Sleep Apnea  (ICD-780.57) 14)  Prostate Cancer, Hx of  (ICD-V10.46) 15)  Colon Cancer, Hx of  (ICD-V10.05) 16)  Osteoarthritis  (ICD-715.90) 17)  Skin Cancer, Hx of  (ICD-V10.83)  Current Medications (verified): 1)  Warfarin Sodium 5 Mg Tabs (Warfarin Sodium) .... Take As Directed 2)  Aspirin 81 Mg  Tbec (Aspirin) .... One By Mouth Every Day 3)  Furosemide 40 Mg Tabs (Furosemide) .[name].. 1 By Mouth Once Daily 4)  Hydralazine Hcl 25 Mg Tabs (Hydralazine Hcl) .... Take 1 Tablet By Mouth Three Times A Day 5)  Klor-Con M20 20 Meq Cr-Tabs (Potassium Chloride Crys Cr) .... Take 2 Tablets Am 6)  Metoprolol Succinate 100 Mg Xr24h-Tab (Metoprolol Succinate) .... Take 1 Tablet By Mouth Two Times A Day 7)  Nitrostat 0.4 Mg Subl (Nitroglycerin) .... As Needed 8)  Lanoxin 0.125 Mg Tabs (Digoxin) .[name].. 1 Tab  Daily 9)  Glyburide 1.25 Mg Tabs (Glyburide) .... Take 1 Tablet By Mouth Once A Day With Breakfast 10)  Freestyle Lite Test  Strp (Glucose Blood) .... Check Blood Sugar Daily  Dx 250.00 11)  Isosorbide Mononitrate Cr 30 Mg Xr24h-Tab (Isosorbide Mononitrate) .... Take One-Half  Tablet By Mouth Daily 12)  Spironolactone 25 Mg Tabs (Spironolactone) .... Take 1 Tablet By Mouth Once A Day  Allergies (verified): No Known Drug Allergies  Past History:  Past Medical History: Last updated: 08/14/2009 Current Problems:  Implantation of AICD- [employer] Jude Unify CORONARY ARTERY DISEASE (ICD-414.00)-lad stenting nonischemic cardiomyopathy COUMADIN THERAPY (ICD-V58.61) ATRIAL FIBRILLATION (ICD-427.31) CONGESTIVE HEART FAILURE-chronic systolic HYPERLIPIDEMIA (ICD-272.4) HYPERTENSION (ICD-401.9) DIABETES MELLITUS, TYPE II (ICD-250.00) RENAL DISEASE, CHRONIC, STAGE III (ICD-585.3) ENCOUNTER FOR LONG-TERM USE OF OTHER MEDICATIONS (ICD-V58.69) CONSTIPATION (ICD-564.00) TOTAL KNEE REPLACEMENT, HX OF (ICD-V45.89) HYPERKALEMIA (ICD-276.7) SLEEP APNEA (ICD-780.57) SPECIAL SCREENING FOR MALIGNANT NEOPLASMS COLON (ICD-V76.51) HEMATURIA UNSPECIFIED (ICD-599.70) ENCOUNTER FOR THERAPEUTIC DRUG MONITORING (ICD-V58.83) ADVEF, DRUG/MEDICINAL/BIOLOGICAL SUBST NOS (ICD-995.20) PROSTATE CANCER, HX OF (ICD-V10.46) COLON CANCER, HX OF (ICD-V10.05) OSTEOARTHRITIS (ICD-715.90) SKIN CANCER, HX OF (ICD-V10.83)  Past Surgical History: Last updated: 08/14/2009 Colectomy Prostatectomy 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  Jude Un[employer]  Family History: Last updated: 04/18/2009 Positive for coronary artery disease on his father, otherwise noncontributory.  Social History: Last updated: 05/07/2010  The patient lives here in [place] with his wife.  He   is retired from [employer].  He quit smoking 1968.  Denies any    recreational or alcohol use.  Tries to follow a heart-healthy diet.  For   exercise, he is actually participating in rehab post knee surgery 2 days   a week and then also does some exercises in range of motion on his own.      Vital Signs:  Patient profile:   77 year old male Height:      72 inches Weight:      191.50 pounds BMI:     26.07 Pulse rate:   75 / minute Pulse rhythm:   irregular Resp:     18 per minute BP sitting:   120 / 80  (left arm) Cuff size:   large  Vitals Entered By: Vikki Ports (October 11, 2010 8:53 AM)  Physical Exam  General:  Well developed, well nourished, in no acute distress. Head:  normocephalic and atraumatic Lungs:  Clear bilaterally to auscultation and percussion. Heart:  irregularly, irregular rhythm with murmur noted.   Abdomen:  Bowel sounds positive; abdomen soft and non-tender without masses, organomegaly, or hernias noted. No hepatosplenomegaly. Extremities:  No clubbing or cyanosis. Neurologic:  Alert and oriented x 3.   EKG  Procedure date:  10/11/2010  Findings:      atrial fibrillation with baseline ventricular pacing.    ICD Specifications Following MD:  Sherryl Manges, MD     Referring MD:  [facility] ICD Vendor:  [facility] Jude     ICD Model Number:  [phone]     ICD Serial Number:  [id number] ICD DOI:  07/19/2009     ICD Implanting MD:  Sherryl Manges, MD  Lead 1:    Location: RV     DOI: 07/19/2009     Model #: [id number]     Serial #: [id number]     Status: active Lead 2:    Location: LV     DOI: 07/19/2009     Model #: [id number]     Serial #: [id number]     Status: active  ICD Follow Up ICD Dependent:  No       ICD Device Measurements Configuration: LV TIP TO RV COIL  Episodes Coumadin:  Yes  Brady Parameters Mode VVIR     Lower Rate Limit:  70     Upper Rate Limit 120 Rate Response Parameters:  Threshold Auto +1.0, slope 7  Tachy Zones VF:  240     VT:  200     Impression & Recommendations:  Problem # 1:  BLURRED VISION  (ICD-368.8) hard to tell what the source is.  Would doubt furosemide but patient concern. He is working with Dr. Cato Mulligan on medications at present time.   Problem # 2:  CARDIOMYOPATHY, PRIMARY, DILATED (ICD-425.4) appears to be stable from a volume standpoint.  Would continue medical therapy.  His updated medication list for this problem includes:    Warfarin Sodium 5 Mg Tabs (Warfarin sodium) .[name]... Take as directed    Aspirin 81 Mg Tbec (Aspirin) ..... One by mouth every day    Furosemide 40 Mg Tabs (Furosemide) .[name]... 1 by mouth once daily    Metoprolol Succinate 100 Mg Xr24h-tab (Metoprolol succinate) .[name]... Take 1 tablet by mouth two times a day    Nitrostat 0.4 Mg Subl (Nitroglycerin) .[name]... As needed    Lanoxin 0.125 Mg Tabs (Digoxin) .[name]... 1 tab daily    Isosorbide Mononitrate Cr 30 Mg Xr24h-tab (Isosorbide mononitrate) .[name]... Take one-half  tablet by mouth daily  Spironolactone 25 Mg Tabs (Spironolactone) .[name]... Take 1 tablet by mouth once a day  Problem # 3:  CORONARY ARTERY DISEASE (ICD-414.00) no increase in angina.  I would lean toward continued ASA, although not mandatory as discussed by Dr. Graciela Husbands. His updated medication list for this problem includes:    Warfarin Sodium 5 Mg Tabs (Warfarin sodium) .[name]... Take as directed    Aspirin 81 Mg Tbec (Aspirin) ..... One by mouth every day    Metoprolol Succinate 100 Mg Xr24h-tab (Metoprolol succinate) .[name]... Take 1 tablet by mouth two times a day    Nitrostat 0.4 Mg Subl (Nitroglycerin) .[name]... As needed    Isosorbide Mononitrate Cr 30 Mg Xr24h-tab (Isosorbide mononitrate) .[name]... Take one-half  tablet by mouth daily  Orders: EKG w/ Interpretation (93000)  Problem # 4:  ATRIAL FIBRILLATION (ICD-427.31) rate is adequately controlled at present.  His updated medication list for this problem includes:    Warfarin Sodium 5 Mg Tabs (Warfarin sodium) .[name]... Take as directed    Aspirin 81 Mg Tbec (Aspirin) ..... One by mouth every day     Metoprolol Succinate 100 Mg Xr24h-tab (Metoprolol succinate) .[name]... Take 1 tablet by mouth two times a day    Lanoxin 0.125 Mg Tabs (Digoxin) .[name]... 1 tab daily  Orders: EKG w/ Interpretation (93000)  Patient Instructions: 1)  Your physician recommends that you return for lab work: DIGOXIN (414.01, 272.0, 428.22) level--lab opens at 8:00, do not take your digoxin the morning of labwork --10/16/10 2)  Your physician recommends that you continue on your current medications as directed. Please refer to the Current Medication list given to you today. 3)  Your physician wants you to follow-up in:  3 MONTHS. You will receive a reminder letter in the mail two months in advance. If you don't receive a letter, please call our office to schedule the follow-up appointment. Prescriptions: FUROSEMIDE 40 MG TABS (FUROSEMIDE) 1 by mouth once daily  #90 x 3   Entered by:   Julieta Gutting, RN, BSN   Authorized by:   Ronaldo Miyamoto, MD, [facility]   Signed by:   Julieta Gutting, RN, BSN on 10/11/2010   Method used:   Electronically to        [facility] [phone]* (retail)       [address] [place], [place]  [address]       Ph: [phone]       Fax: [phone]   RxID:   [id number]

## 2010-10-24 NOTE — Assessment & Plan Note (Signed)
Summary: 6 week fup//ccnm rsc bmp/njr   Vital Signs:  Patient profile:   77 year old male Weight:      191 pounds Temp:     98.3 degrees F oral Pulse rate:   80 / minute Pulse rhythm:   regular BP sitting:   142 / 76  (left arm) Cuff size:   large  Vitals Entered By: Alfred Levins, CMA (September 27, 2010 10:00 AM) CC: blurred vision, pt thinks furosemide is causing it x2 mths   Primary Care Provider:  Birdie Sons MD  CC:  blurred vision and pt thinks furosemide is causing it x2 mths.  History of Present Illness:  Follow-Up Visit      This is a 78 year old man who presents for Follow-up visit.  The patient denies chest pain, palpitations, edema, and SOB.  Since the last visit the patient notes no new problems or concerns.  The patient reports taking meds as prescribed.  When questioned about possible medication side effects, the patient notes none.  Has had several months of blurred vision that he has related to furosemide use---he has had ophthalmology eval.  All other systems reviewed and were negative except for chronic fatigue---"not too bad".  Current Medications (verified): 1)  Warfarin Sodium 5 Mg Tabs (Warfarin Sodium) .... Take As Directed 2)  Aspirin 81 Mg  Tbec (Aspirin) .... One By Mouth Every Day 3)  Furosemide 40 Mg Tabs (Furosemide) .[name].. 1 By Mouth Once Daily 4)  Hydralazine Hcl 25 Mg Tabs (Hydralazine Hcl) .... Take 1 Tablet By Mouth Three Times A Day 5)  Klor-Con M20 20 Meq Cr-Tabs (Potassium Chloride Crys Cr) .... Take 2 Tablets Am 6)  Metoprolol Succinate 100 Mg Xr24h-Tab (Metoprolol Succinate) .... Take 1 Tablet By Mouth Two Times A Day 7)  Nitrostat 0.4 Mg Subl (Nitroglycerin) .... As Needed 8)  Lanoxin 0.125 Mg Tabs (Digoxin) .[name].. 1 Tab Daily 9)  Glyburide 1.25 Mg Tabs (Glyburide) .... Take 1 Tablet By Mouth Once A Day With Breakfast 10)  Freestyle Lite Test  Strp (Glucose Blood) .... Check Blood Sugar Daily  Dx 250.00 11)  Isosorbide Mononitrate Cr 30 Mg  Xr24h-Tab (Isosorbide Mononitrate) .... Take One-Half  Tablet By Mouth Daily 12)  Spironolactone 25 Mg Tabs (Spironolactone) .... Take 1 Tablet By Mouth Once A Day  Allergies (verified): No Known Drug Allergies  Physical Exam  General:  well-developed well-nourished male in no acute distress. HEENT exam atraumatic, normocephalic symmetric her muscles are intact. Neck is supple. Jugular venous pressure is normal. Chest is clear to auscultation cardiac exam S1-S2 are irregular with a 2/6 systolict murmur. Extremities there is no clubbing cyanosis or edema.    Impression & Recommendations:  Problem # 1:  CARDIOMYOPATHY, PRIMARY, DILATED (ICD-425.4) clinically stable  Problem # 2:  DIABETES MELLITUS, TYPE II (ICD-250.00)  i wonder whether fluctuating blood sugars could be contributing to visual "difference" His updated medication list for this problem includes:    Aspirin 81 Mg Tbec (Aspirin) ..... One by mouth every day    Glyburide 1.25 Mg Tabs (Glyburide) .[name]... Take 1 tablet by mouth once a day with breakfast  Labs Reviewed: Creat: 2.6 (09/04/2010)     Last Eye Exam: normal-pt's report (09/22/2009) Reviewed HgBA1c results: 7.7 (04/30/2010)  7.2 (12/25/2009)  Orders: TLB-A1C / Hgb A1C (Glycohemoglobin) (83036-A1C) Venipuncture (04540)  Problem # 3:  HYPERTENSION (ICD-401.9)  controlled continue current medications  His updated medication list for this problem includes:    Furosemide  40 Mg Tabs (Furosemide) .[name]... 1 by mouth once daily    Hydralazine Hcl 25 Mg Tabs (Hydralazine hcl) .[name]... Take 1 tablet by mouth three times a day    Metoprolol Succinate 100 Mg Xr24h-tab (Metoprolol succinate) .[name]... Take 1 tablet by mouth two times a day    Spironolactone 25 Mg Tabs (Spironolactone) .[name]... Take 1 tablet by mouth once a day  BP today: 142/76 Prior BP: 160/98 (08/19/2010)  Prior 10 Yr Risk Heart Disease: N/A (08/24/2006)  Labs Reviewed: K+: 4.9 (09/04/2010) Creat: : 2.6  (09/04/2010)   Chol: 177 (04/30/2010)   HDL: 34.90 (04/30/2010)   LDL: 110 (04/30/2010)   TG: 159.0 (04/30/2010)  Orders: TLB-BMP (Basic Metabolic Panel-BMET) (80048-METABOL)  Problem # 4:  HYPERLIPIDEMIA (ICD-272.4)  Labs Reviewed: SGOT: 24 (04/30/2010)   SGPT: 18 (04/30/2010)  Lipid Goals: Chol Goal: 200 (08/24/2006)   HDL Goal: 40 (08/24/2006)   LDL Goal: 70 (08/24/2006)   TG Goal: 150 (08/24/2006)  Prior 10 Yr Risk Heart Disease: N/A (08/24/2006)   HDL:34.90 (04/30/2010), 38.60 (12/25/2009)  LDL:110 (04/30/2010), 88 (62/95/2841)  Chol:177 (04/30/2010), 189 (12/25/2009)  Trig:159.0 (04/30/2010), 330.0 (12/25/2009)  Orders: TLB-Lipid Panel (80061-LIPID) TLB-Hepatic/Liver Function Pnl (80076-HEPATIC) TLB-TSH (Thyroid Stimulating Hormone) (84443-TSH)  Complete Medication List: 1)  Warfarin Sodium 5 Mg Tabs (Warfarin sodium) .... Take as directed 2)  Aspirin 81 Mg Tbec (Aspirin) .... One by mouth every day 3)  Furosemide 40 Mg Tabs (Furosemide) .[name].. 1 by mouth once daily 4)  Hydralazine Hcl 25 Mg Tabs (Hydralazine hcl) .... Take 1 tablet by mouth three times a day 5)  Klor-con M20 20 Meq Cr-tabs (Potassium chloride crys cr) .... Take 2 tablets am 6)  Metoprolol Succinate 100 Mg Xr24h-tab (Metoprolol succinate) .... Take 1 tablet by mouth two times a day 7)  Nitrostat 0.4 Mg Subl (Nitroglycerin) .... As needed 8)  Lanoxin 0.125 Mg Tabs (Digoxin) .[name].. 1 tab daily 9)  Glyburide 1.25 Mg Tabs (Glyburide) .... Take 1 tablet by mouth once a day with breakfast 10)  Freestyle Lite Test Strp (Glucose blood) .... Check blood sugar daily  dx 250.00 11)  Isosorbide Mononitrate Cr 30 Mg Xr24h-tab (Isosorbide mononitrate) .... Take one-half  tablet by mouth daily 12)  Spironolactone 25 Mg Tabs (Spironolactone) .... Take 1 tablet by mouth once a day  Other Orders: Protime (32440NU)  Patient Instructions: 1)  .   Orders Added: 1)  Est. Patient Level IV [27253] 2)  TLB-Lipid Panel  [80061-LIPID] 3)  TLB-Hepatic/Liver Function Pnl [80076-HEPATIC] 4)  TLB-TSH (Thyroid Stimulating Hormone) [84443-TSH] 5)  TLB-BMP (Basic Metabolic Panel-BMET) [80048-METABOL] 6)  TLB-A1C / Hgb A1C (Glycohemoglobin) [83036-A1C] 7)  Venipuncture [36415] 8)  Protime [66440HK]  Appended Document: 6 week fup//ccnm rsc bmp/njr   ANTICOAGULATION RECORD PREVIOUS REGIMEN & LAB RESULTS Anticoagulation Diagnosis:  Atrial Fibrillation on  07/16/2009 Previous INR Goal Range:  2.0-3.0 on  02/24/2008 Previous INR:  2.7 on  09/04/2010 Previous Coumadin Dose(mg):  5mg  on mon & fri then 2.5mg  on other days on  06/25/2010 Previous Regimen:  same on  09/04/2010 Previous Coagulation Comments:  OV on  06/13/2008  NEW REGIMEN & LAB RESULTS Current INR: 2.8 Regimen: same  Repeat testing in: 4 weeks  Anticoagulation Visit Questionnaire Coumadin dose missed/changed:  No Abnormal Bleeding Symptoms:  No  Any diet changes including alcohol intake, vegetables or greens since the last visit:  No Any illnesses or hospitalizations since the last visit:  No Any signs of clotting since the last visit (including chest discomfort,  dizziness, shortness of breath, arm tingling, slurred speech, swelling or redness in leg):  Yes  MEDICATIONS WARFARIN SODIUM 5 MG TABS (WARFARIN SODIUM) take as directed ASPIRIN 81 MG  TBEC (ASPIRIN) one by mouth every day FUROSEMIDE 40 MG TABS (FUROSEMIDE) 1 by mouth once daily HYDRALAZINE HCL 25 MG TABS (HYDRALAZINE HCL) Take 1 tablet by mouth three times a day KLOR-CON M20 20 MEQ CR-TABS (POTASSIUM CHLORIDE CRYS CR) take 2 tablets AM METOPROLOL SUCCINATE 100 MG XR24H-TAB (METOPROLOL SUCCINATE) Take 1 tablet by mouth two times a day NITROSTAT 0.4 MG SUBL (NITROGLYCERIN) as needed LANOXIN 0.125 MG TABS (DIGOXIN) 1 tab daily GLYBURIDE 1.25 MG TABS (GLYBURIDE) Take 1 tablet by mouth once a day with breakfast FREESTYLE LITE TEST  STRP (GLUCOSE BLOOD) check blood sugar daily  DX  250.00 ISOSORBIDE MONONITRATE CR 30 MG XR24H-TAB (ISOSORBIDE MONONITRATE) Take one-half  tablet by mouth daily SPIRONOLACTONE 25 MG TABS (SPIRONOLACTONE) Take 1 tablet by mouth once a day    Laboratory Results   Blood Tests      INR: 2.8   (Normal Range: 0.88-1.12   Therap INR: 2.0-3.5) Comments: Rita Ohara  September 27, 2010 2:47 PM

## 2010-10-24 NOTE — Assessment & Plan Note (Signed)
Summary: pt//ccm  Nurse Visit   Allergies: No Known Drug Allergies Laboratory Results   Blood Tests      INR: 2.7   (Normal Range: 0.88-1.12   Therap INR: 2.0-3.5) Comments: Rita Ohara  September 04, 2010 10:57 AM     Orders Added: 1)  Venipuncture [16109] 2)  Specimen Handling [99000] 3)  TLB-BMP (Basic Metabolic Panel-BMET) [80048-METABOL] 4)  Protime [[id number]]   ANTICOAGULATION RECORD PREVIOUS REGIMEN & LAB RESULTS Anticoagulation Diagnosis:  Atrial Fibrillation on  07/16/2009 Previous INR Goal Range:  2.0-3.0 on  02/24/2008 Previous INR:  2.0 on  08/07/2010 Previous Coumadin Dose(mg):  5mg  on mon & fri then 2.5mg  on other days on  06/25/2010 Previous Regimen:  same on  03/12/2010 Previous Coagulation Comments:  OV on  06/13/2008  NEW REGIMEN & LAB RESULTS Current INR: 2.7 Regimen: same  Repeat testing in: 4 weeks  Anticoagulation Visit Questionnaire Coumadin dose missed/changed:  No Abnormal Bleeding Symptoms:  No  Any diet changes including alcohol intake, vegetables or greens since the last visit:  No Any illnesses or hospitalizations since the last visit:  No Any signs of clotting since the last visit (including chest discomfort, dizziness, shortness of breath, arm tingling, slurred speech, swelling or redness in leg):  No  MEDICATIONS WARFARIN SODIUM 5 MG TABS (WARFARIN SODIUM) take as directed ASPIRIN 81 MG  TBEC (ASPIRIN) one by mouth every day FUROSEMIDE 80 MG TABS (FUROSEMIDE) Take 1 tablet by mouth once a day HYDRALAZINE HCL 25 MG TABS (HYDRALAZINE HCL) Take 1 tablet by mouth three times a day KLOR-CON M20 20 MEQ CR-TABS (POTASSIUM CHLORIDE CRYS CR) take 2 tablets AM METOPROLOL SUCCINATE 100 MG XR24H-TAB (METOPROLOL SUCCINATE) Take 1 tablet by mouth two times a day NITROSTAT 0.4 MG SUBL (NITROGLYCERIN) as needed LANOXIN 0.125 MG TABS (DIGOXIN) 1 tab daily GLYBURIDE 1.25 MG TABS (GLYBURIDE) Take 1 tablet by mouth once a day with  breakfast FREESTYLE LITE TEST  STRP (GLUCOSE BLOOD) check blood sugar daily  DX 250.00 ISOSORBIDE MONONITRATE CR 30 MG XR24H-TAB (ISOSORBIDE MONONITRATE) Take one-half  tablet by mouth daily SPIRONOLACTONE 25 MG TABS (SPIRONOLACTONE) Take 1 tablet by mouth once a day

## 2010-10-24 NOTE — Progress Notes (Signed)
Summary: [facility]  Phone Note From Pharmacy Call back at [facility] [phone] ext [phone]    Caller: [facility] - [place] Fail Summary of Call: Called to check on status of form faxed on 09/18/10 for diabetes testing supplies. Refaxing the req. Pls complete and fax back to fax# [phone]  asap.  Initial call taken by: Lucy Antigua,  September 25, 2010 4:13 PM  Follow-up for Phone Call        Dr Cato Mulligan, have you seen this fax?  Its not in your stack Follow-up by: Alfred Levins, CMA,  September 30, 2010 11:45 AM  Additional Follow-up for Phone Call Additional follow up Details #1::        have them re-send information. Additional Follow-up by: Birdie Sons MD,  September 30, 2010 12:00 PM    Additional Follow-up for Phone Call Additional follow up Details #2::    they will refax Follow-up by: Alfred Levins, CMA,  September 30, 2010 4:25 PM

## 2010-10-24 NOTE — Letter (Signed)
Summary: Medical Necessity for CPAP Supplies  Medical Necessity for CPAP Supplies   Imported By: Maryln Gottron 09/06/2010 10:34:54  _____________________________________________________________________  External Attachment:    Type:   Image     Comment:   External Document

## 2010-10-25 ENCOUNTER — Ambulatory Visit: Admit: 2010-10-25 | Payer: Self-pay | Admitting: Internal Medicine

## 2010-10-25 ENCOUNTER — Other Ambulatory Visit (INDEPENDENT_AMBULATORY_CARE_PROVIDER_SITE_OTHER): Payer: Medicare Other | Admitting: Internal Medicine

## 2010-10-25 DIAGNOSIS — I4891 Unspecified atrial fibrillation: Secondary | ICD-10-CM

## 2010-10-25 NOTE — Progress Notes (Signed)
Lab Results  Component Value Date   INR 2.7 09/04/2010   INR 2.0 08/07/2010   INR 2.2 06/25/2010

## 2010-11-11 ENCOUNTER — Other Ambulatory Visit: Payer: Medicare Other

## 2010-11-12 ENCOUNTER — Other Ambulatory Visit (INDEPENDENT_AMBULATORY_CARE_PROVIDER_SITE_OTHER): Payer: Medicare Other | Admitting: Internal Medicine

## 2010-11-12 DIAGNOSIS — I4891 Unspecified atrial fibrillation: Secondary | ICD-10-CM

## 2010-11-14 ENCOUNTER — Encounter: Payer: Self-pay | Admitting: Internal Medicine

## 2010-11-14 ENCOUNTER — Encounter (INDEPENDENT_AMBULATORY_CARE_PROVIDER_SITE_OTHER): Payer: Medicare Other

## 2010-11-14 DIAGNOSIS — I428 Other cardiomyopathies: Secondary | ICD-10-CM

## 2010-11-26 ENCOUNTER — Ambulatory Visit (INDEPENDENT_AMBULATORY_CARE_PROVIDER_SITE_OTHER): Payer: Medicare Other | Admitting: Internal Medicine

## 2010-11-26 DIAGNOSIS — I4891 Unspecified atrial fibrillation: Secondary | ICD-10-CM

## 2010-11-28 NOTE — Procedures (Signed)
Summary: device/saf   Current Medications (verified): 1)  Warfarin Sodium 5 Mg Tabs (Warfarin Sodium) .... Take As Directed 2)  Aspirin 81 Mg  Tbec (Aspirin) .... One By Mouth Every Day 3)  Furosemide 40 Mg Tabs (Furosemide) .[name].. 1 By Mouth Once Daily 4)  Hydralazine Hcl 25 Mg Tabs (Hydralazine Hcl) .... Take 1 Tablet By Mouth Three Times A Day 5)  Klor-Con M20 20 Meq Cr-Tabs (Potassium Chloride Crys Cr) .... Take 2 Tablets Am 6)  Metoprolol Succinate 100 Mg Xr24h-Tab (Metoprolol Succinate) .... Take 1 Tablet By Mouth Two Times A Day 7)  Nitrostat 0.4 Mg Subl (Nitroglycerin) .... As Needed 8)  Lanoxin 0.125 Mg Tabs (Digoxin) .[name].. 1 Tab Daily 9)  Glyburide 1.25 Mg Tabs (Glyburide) .... Take 1 Tablet By Mouth Once A Day With Breakfast 10)  Freestyle Lite Test  Strp (Glucose Blood) .... Check Blood Sugar Daily  Dx 250.00 11)  Isosorbide Mononitrate Cr 30 Mg Xr24h-Tab (Isosorbide Mononitrate) .... Take One-Half  Tablet By Mouth Daily 12)  Spironolactone 25 Mg Tabs (Spironolactone) .... Take 1 Tablet By Mouth Once A Day  Allergies (verified): No Known Drug Allergies   ICD Specifications Following MD:  Sherryl Manges, MD     Referring MD:  [facility] ICD Vendor:  [employer]     ICD Model Number:  [id number]     ICD Serial Number:  [id number] ICD DOI:  07/19/2009     ICD Implanting MD:  Sherryl Manges, MD  Lead 1:    Location: RV     DOI: 07/19/2009     Model #: [id number]     Serial #: [id number]     Status: active Lead 2:    Location: LV     DOI: 07/19/2009     Model #: [id number]     Serial #: [id number]     Status: active  ICD Follow Up Remote Check?  No ICD Dependent:  No       ICD Device Measurements Configuration: LV TIP TO RV COIL  Episodes Coumadin:  Yes  Brady Parameters Mode VVIR     Lower Rate Limit:  70     Upper Rate Limit 120 Rate Response Parameters:  Threshold Auto +1.0, slope 7  Tachy Zones VF:  240     VT:  200

## 2010-11-28 NOTE — Cardiovascular Report (Signed)
Summary: Office Visit   Office Visit   Imported By: Roderic Ovens 11/21/2010 15:57:48  _____________________________________________________________________  External Attachment:    Type:   Image     Comment:   External Document

## 2010-12-03 ENCOUNTER — Encounter: Payer: Medicare Other | Attending: Internal Medicine | Admitting: Dietician

## 2010-12-03 DIAGNOSIS — E119 Type 2 diabetes mellitus without complications: Secondary | ICD-10-CM | POA: Insufficient documentation

## 2010-12-03 DIAGNOSIS — Z713 Dietary counseling and surveillance: Secondary | ICD-10-CM | POA: Insufficient documentation

## 2010-12-05 ENCOUNTER — Encounter: Payer: Self-pay | Admitting: Internal Medicine

## 2010-12-09 ENCOUNTER — Ambulatory Visit (INDEPENDENT_AMBULATORY_CARE_PROVIDER_SITE_OTHER): Payer: Medicare Other | Admitting: Internal Medicine

## 2010-12-09 ENCOUNTER — Encounter: Payer: Self-pay | Admitting: Internal Medicine

## 2010-12-09 VITALS — BP 132/74 | HR 76 | Temp 97.8°F | Ht 72.0 in | Wt 187.0 lb

## 2010-12-09 DIAGNOSIS — I509 Heart failure, unspecified: Secondary | ICD-10-CM

## 2010-12-09 DIAGNOSIS — I4891 Unspecified atrial fibrillation: Secondary | ICD-10-CM

## 2010-12-09 DIAGNOSIS — E119 Type 2 diabetes mellitus without complications: Secondary | ICD-10-CM

## 2010-12-09 MED ORDER — NITROGLYCERIN 0.4 MG SL SUBL: 0.4000 mg | SUBLINGUAL_TABLET | SUBLINGUAL | Status: DC | PRN

## 2010-12-09 MED ORDER — GLYBURIDE 1.25 MG PO TABS: 1.2500 mg | ORAL_TABLET | Freq: Two times a day (BID) | ORAL | Status: DC

## 2010-12-09 MED ORDER — TORSEMIDE 20 MG PO TABS: 20.0000 mg | ORAL_TABLET | Freq: Every day | ORAL | Status: DC

## 2010-12-09 NOTE — Assessment & Plan Note (Signed)
Will change to torsemide on the OFF chance that furosemide is contributing to blured vision.

## 2010-12-09 NOTE — Progress Notes (Signed)
  Subjective:    Patient ID: [name] [name], male    DOB: 09/20/1931, 77 y.o.   MRN: [id number]  HPI  Dm---cbgs---much improved---average in the  150s. Has been working with nutritionist and doing much better from diet point of view  CHF---doing well  Blurred vision--- no new sxs, HE STILL thinks related to furosemide. This has been reviewed as an unlikely explanation but [name] [name] has extraordinary insight into his medical issues and medications.  Past Medical History  Diagnosis Date  . Atrial fibrillation 07/13/2006  . CARDIOMYOPATHY, PRIMARY, DILATED 08/07/2009  . COLON CANCER, HX OF 07/13/2006  . CONGESTIVE HEART FAILURE 07/13/2006  . CORONARY ARTERY DISEASE 07/13/2006  . PROSTATE CANCER, HX OF 07/13/2006  . RENAL DISEASE, CHRONIC, STAGE III 04/18/2009  . SKIN CANCER, HX OF 04/18/2009  . SLEEP APNEA 11/01/2008  . Hypertension   . Hyperlipidemia   . Diabetes mellitus   . Hyperkalemia   . Arthritis    Past Surgical History  Procedure Date  . Colectomy   . Prostatectomy   . Penile prosthesis placement   . Removed prosthetic eye   . Ptca     stent placed  . Total knee arthroplasty   . St jude unify     reports that he quit smoking about 44 years ago. He does not have any smokeless tobacco history on file. He reports that he does not drink alcohol or use illicit drugs. family history includes Heart disease in his father and Throat cancer in his mother. No Known Allergies   Review of Systems  patient denies chest pain, shortness of breath, orthopnea. Denies lower extremity edema, abdominal pain, change in appetite, change in bowel movements. Patient denies rashes, musculoskeletal complaints. No other specific complaints in a complete review of systems.      Objective:   Physical Exam  well-developed well-nourished male in no acute distress. HEENT exam atraumatic, normocephalic, neck supple without jugular venous distention. Carotid upstrokes are normal Chest clear to  auscultation cardiac exam S1-S2 are regular. Abdominal exam overweight  soft and nontender. Extremities no edema. Neurologic exam is alert with a normal gait.        Assessment & Plan:

## 2010-12-11 NOTE — Assessment & Plan Note (Signed)
No complications on warfarin. 

## 2010-12-11 NOTE — Assessment & Plan Note (Signed)
Improving control based on diet and lifestyle changes

## 2010-12-14 ENCOUNTER — Other Ambulatory Visit: Payer: Self-pay | Admitting: Adult Health

## 2010-12-18 ENCOUNTER — Other Ambulatory Visit: Payer: Self-pay | Admitting: *Deleted

## 2010-12-18 ENCOUNTER — Other Ambulatory Visit: Payer: Self-pay | Admitting: Internal Medicine

## 2010-12-18 DIAGNOSIS — I509 Heart failure, unspecified: Secondary | ICD-10-CM

## 2010-12-18 MED ORDER — HYDRALAZINE HCL 25 MG PO TABS: 25.0000 mg | ORAL_TABLET | Freq: Three times a day (TID) | ORAL | Status: DC

## 2010-12-18 MED ORDER — HYDRALAZINE HCL 25 MG PO TABS: 25.0000 mg | ORAL_TABLET | Freq: Four times a day (QID) | ORAL | Status: DC

## 2010-12-18 NOTE — Telephone Encounter (Signed)
rx sent in electronically 

## 2010-12-18 NOTE — Telephone Encounter (Signed)
Pt called and said that [facility] on [address], has sent over 2 refill req for pts Hydrazaline 25 mg tid. Pls call in to pharmacy asap today.

## 2010-12-19 ENCOUNTER — Telehealth: Payer: Self-pay | Admitting: Cardiology

## 2010-12-19 NOTE — Telephone Encounter (Signed)
I spoke with the pt and he has a cataract and needs to have the lense replaced in eye.  Dr Cato Mulligan referred the pt to [facility], but the pt would like to find a Biomedical engineer. The pt is currently trying to find a surgeon locally.  I made the pt aware that once he is seen by a surgeon then Dr Riley Kill would get involved with clearance.

## 2010-12-19 NOTE — Telephone Encounter (Signed)
[place] °

## 2010-12-20 ENCOUNTER — Telehealth: Payer: Self-pay | Admitting: *Deleted

## 2010-12-20 NOTE — Telephone Encounter (Signed)
Refer to [facility]  Dr. Raynald Kemp for foot

## 2010-12-20 NOTE — Telephone Encounter (Signed)
Pt currently goes to [facility] and he would like a referral to have cataract surgery in [place].  Also wants a referral to a podiatrist for a diabetic foot exam.

## 2010-12-23 MED ORDER — HYDRALAZINE HCL 25 MG PO TABS: 25.0000 mg | ORAL_TABLET | Freq: Three times a day (TID) | ORAL | Status: DC

## 2010-12-23 NOTE — Telephone Encounter (Signed)
Pt aware, he is going to drop off diabetic form for shoes.  I misunderstood pt, he has already had a diabetic foot exam and just needs Dr Cato Mulligan to sign form.  Also put in referral for Dr Hazle Quant.  Changed rx of Hydrazaline and sent to pharmacy

## 2010-12-23 NOTE — Telephone Encounter (Signed)
Pt called and said that he went to foot dr and was give a form for Diabetic Shoes, that Dr Cato Mulligan needs to sign. Pt is coming in tomorrow for pt lab and is req that Dr Cato Mulligan signs form then. Also the eye doctor recommended that he gets catarac surgery and pt is req a referral to a local place to get catarac surgery done. Pt had a script for Hydrazaline. Intrustions were to take 4 times a day, but pt has been taking it 3 times a day. Pt wants to know if the change was an error or not?

## 2010-12-24 ENCOUNTER — Ambulatory Visit: Payer: Medicare Other

## 2010-12-24 DIAGNOSIS — Z7901 Long term (current) use of anticoagulants: Secondary | ICD-10-CM

## 2010-12-24 DIAGNOSIS — I4891 Unspecified atrial fibrillation: Secondary | ICD-10-CM

## 2010-12-24 NOTE — Patient Instructions (Signed)
Same dose 

## 2010-12-26 LAB — PROTIME-INR
INR: 1.56 — ABNORMAL HIGH (ref 0.00–1.49)
INR: 1.23 (ref 0.00–1.49)
INR: 1.23 (ref 0.00–1.49)
INR: 1.26 (ref 0.00–1.49)
Prothrombin Time: 18.5 seconds — ABNORMAL HIGH (ref 11.6–15.2)
Prothrombin Time: 15.4 seconds — ABNORMAL HIGH (ref 11.6–15.2)
Prothrombin Time: 15.7 seconds — ABNORMAL HIGH (ref 11.6–15.2)

## 2010-12-26 LAB — BASIC METABOLIC PANEL WITH GFR
BUN: 30 mg/dL — ABNORMAL HIGH (ref 6–23)
CO2: 28 meq/L (ref 19–32)
Calcium: 8.2 mg/dL — ABNORMAL LOW (ref 8.4–10.5)
Chloride: 106 meq/L (ref 96–112)
Creatinine, Ser: 2.17 mg/dL — ABNORMAL HIGH (ref 0.4–1.5)
GFR calc non Af Amer: 30 mL/min — ABNORMAL LOW
Glucose, Bld: 170 mg/dL — ABNORMAL HIGH (ref 70–99)
Potassium: 3.1 meq/L — ABNORMAL LOW (ref 3.5–5.1)
Sodium: 140 meq/L (ref 135–145)

## 2010-12-26 LAB — CBC
HCT: 37.3 % — ABNORMAL LOW (ref 39.0–52.0)
HCT: 41.2 % (ref 39.0–52.0)
Hemoglobin: 13.8 g/dL (ref 13.0–17.0)
MCHC: 34.3 g/dL (ref 30.0–36.0)
MCV: 90.7 fL (ref 78.0–100.0)
MCV: 90.8 fL (ref 78.0–100.0)
Platelets: 106 10*3/uL — ABNORMAL LOW (ref 150–400)
Platelets: 107 10*3/uL — ABNORMAL LOW (ref 150–400)
Platelets: 114 10*3/uL — ABNORMAL LOW (ref 150–400)
Platelets: 122 10*3/uL — ABNORMAL LOW (ref 150–400)
RBC: 4.28 MIL/uL (ref 4.22–5.81)
RDW: 15.1 % (ref 11.5–15.5)
RDW: 15.2 % (ref 11.5–15.5)
RDW: 15.3 % (ref 11.5–15.5)
RDW: 15.6 % — ABNORMAL HIGH (ref 11.5–15.5)
WBC: 5.7 10*3/uL (ref 4.0–10.5)

## 2010-12-26 LAB — BASIC METABOLIC PANEL
BUN: 35 mg/dL — ABNORMAL HIGH (ref 6–23)
BUN: 24 mg/dL — ABNORMAL HIGH (ref 6–23)
BUN: 29 mg/dL — ABNORMAL HIGH (ref 6–23)
BUN: 32 mg/dL — ABNORMAL HIGH (ref 6–23)
CO2: 21 mEq/L (ref 19–32)
CO2: 24 mEq/L (ref 19–32)
Calcium: 8.2 mg/dL — ABNORMAL LOW (ref 8.4–10.5)
Calcium: 8.4 mg/dL (ref 8.4–10.5)
Calcium: 8.6 mg/dL (ref 8.4–10.5)
Calcium: 8.8 mg/dL (ref 8.4–10.5)
Chloride: 100 mEq/L (ref 96–112)
Chloride: 105 mEq/L (ref 96–112)
Creatinine, Ser: 2.02 mg/dL — ABNORMAL HIGH (ref 0.4–1.5)
Creatinine, Ser: 2.1 mg/dL — ABNORMAL HIGH (ref 0.4–1.5)
Creatinine, Ser: 2.35 mg/dL — ABNORMAL HIGH (ref 0.4–1.5)
GFR calc Af Amer: 37 mL/min — ABNORMAL LOW (ref >60)
GFR calc non Af Amer: 27 mL/min — ABNORMAL LOW (ref >60)
GFR calc non Af Amer: 29 mL/min — ABNORMAL LOW (ref >60)
Glucose, Bld: 234 mg/dL — ABNORMAL HIGH (ref 70–99)
Glucose, Bld: 167 mg/dL — ABNORMAL HIGH (ref 70–99)
Glucose, Bld: 167 mg/dL — ABNORMAL HIGH (ref 70–99)
Glucose, Bld: 181 mg/dL — ABNORMAL HIGH (ref 70–99)
Potassium: 3.1 mEq/L — ABNORMAL LOW (ref 3.5–5.1)
Sodium: 140 mEq/L (ref 135–145)

## 2010-12-26 LAB — DIFFERENTIAL
Basophils Absolute: 0 10*3/uL (ref 0.0–0.1)
Basophils Relative: 0 % (ref 0–1)
Lymphocytes Relative: 25 % (ref 12–46)
Neutro Abs: 3.5 10*3/uL (ref 1.7–7.7)
Neutrophils Relative %: 66 % (ref 43–77)

## 2010-12-26 LAB — POCT I-STAT 3, ART BLOOD GAS (G3+)
Acid-base deficit: 2 mmol/L (ref 0.0–2.0)
Bicarbonate: 20.5 mEq/L (ref 20.0–24.0)
O2 Saturation: 93 %
pO2, Arterial: 64 mmHg — ABNORMAL LOW (ref 80.0–100.0)

## 2010-12-26 LAB — COMPREHENSIVE METABOLIC PANEL
Alkaline Phosphatase: 38 U/L — ABNORMAL LOW (ref 39–117)
BUN: 34 mg/dL — ABNORMAL HIGH (ref 6–23)
Chloride: 105 mEq/L (ref 96–112)
Creatinine, Ser: 2.23 mg/dL — ABNORMAL HIGH (ref 0.4–1.5)
Glucose, Bld: 190 mg/dL — ABNORMAL HIGH (ref 70–99)
Potassium: 3.4 mEq/L — ABNORMAL LOW (ref 3.5–5.1)
Total Bilirubin: 1.1 mg/dL (ref 0.3–1.2)
Total Protein: 6.2 g/dL (ref 6.0–8.3)

## 2010-12-26 LAB — HEMOGLOBIN A1C
Hgb A1c MFr Bld: 8.6 % — ABNORMAL HIGH (ref 4.6–6.1)
Mean Plasma Glucose: 200 mg/dL

## 2010-12-26 LAB — POCT I-STAT 3, VENOUS BLOOD GAS (G3P V)
pCO2, Ven: 37.1 mmHg — ABNORMAL LOW (ref 45.0–50.0)
pH, Ven: 7.392 — ABNORMAL HIGH (ref 7.250–7.300)
pO2, Ven: 28 mmHg — CL (ref 30.0–45.0)

## 2010-12-26 LAB — GLUCOSE, CAPILLARY
Glucose-Capillary: 185 mg/dL — ABNORMAL HIGH (ref 70–99)
Glucose-Capillary: 203 mg/dL — ABNORMAL HIGH (ref 70–99)
Glucose-Capillary: 309 mg/dL — ABNORMAL HIGH (ref 70–99)

## 2010-12-26 LAB — HEPARIN LEVEL (UNFRACTIONATED)
Heparin Unfractionated: 0.37 IU/mL (ref 0.30–0.70)
Heparin Unfractionated: 0.45 IU/mL (ref 0.30–0.70)
Heparin Unfractionated: 0.57 IU/mL (ref 0.30–0.70)

## 2010-12-26 LAB — HEPARIN INDUCED THROMBOCYTOPENIA PNL: Heparin Induced Plt Ab: NEGATIVE

## 2010-12-26 LAB — BRAIN NATRIURETIC PEPTIDE: Pro B Natriuretic peptide (BNP): 127 pg/mL — ABNORMAL HIGH (ref 0.0–100.0)

## 2010-12-29 LAB — COMPREHENSIVE METABOLIC PANEL
ALT: 18 U/L (ref 0–53)
AST: 21 U/L (ref 0–37)
CO2: 26 mEq/L (ref 19–32)
Chloride: 106 mEq/L (ref 96–112)
GFR calc Af Amer: 37 mL/min — ABNORMAL LOW (ref >60)
GFR calc non Af Amer: 31 mL/min — ABNORMAL LOW (ref >60)
Sodium: 140 mEq/L (ref 135–145)
Total Bilirubin: 1.1 mg/dL (ref 0.3–1.2)

## 2010-12-29 LAB — DIFFERENTIAL
Eosinophils Absolute: 0.1 10*3/uL (ref 0.0–0.7)
Eosinophils Relative: 1 % (ref 0–5)
Lymphs Abs: 2.1 10*3/uL (ref 0.7–4.0)
Monocytes Absolute: 0.4 10*3/uL (ref 0.1–1.0)
Monocytes Relative: 4 % (ref 3–12)

## 2010-12-29 LAB — CARDIAC PANEL(CRET KIN+CKTOT+MB+TROPI)
CK, MB: 1.4 ng/mL (ref 0.3–4.0)
CK, MB: 1.5 ng/mL (ref 0.3–4.0)
CK, MB: 1.5 ng/mL (ref 0.3–4.0)
Total CK: 104 U/L (ref 7–232)
Troponin I: 0.05 ng/mL (ref 0.00–0.06)

## 2010-12-29 LAB — GLUCOSE, CAPILLARY
Glucose-Capillary: 122 mg/dL — ABNORMAL HIGH (ref 70–99)
Glucose-Capillary: 147 mg/dL — ABNORMAL HIGH (ref 70–99)
Glucose-Capillary: 198 mg/dL — ABNORMAL HIGH (ref 70–99)
Glucose-Capillary: 226 mg/dL — ABNORMAL HIGH (ref 70–99)
Glucose-Capillary: 129 mg/dL — ABNORMAL HIGH (ref 70–99)
Glucose-Capillary: 149 mg/dL — ABNORMAL HIGH (ref 70–99)
Glucose-Capillary: 177 mg/dL — ABNORMAL HIGH (ref 70–99)
Glucose-Capillary: 184 mg/dL — ABNORMAL HIGH (ref 70–99)

## 2010-12-29 LAB — PROTIME-INR
INR: 1.7 — ABNORMAL HIGH (ref 0.00–1.49)
Prothrombin Time: 21 seconds — ABNORMAL HIGH (ref 11.6–15.2)
Prothrombin Time: 21.4 seconds — ABNORMAL HIGH (ref 11.6–15.2)
Prothrombin Time: 22.8 seconds — ABNORMAL HIGH (ref 11.6–15.2)

## 2010-12-29 LAB — CBC
HCT: 40.5 % (ref 39.0–52.0)
Hemoglobin: 12 g/dL — ABNORMAL LOW (ref 13.0–17.0)
MCV: 86.8 fL (ref 78.0–100.0)
Platelets: 211 10*3/uL (ref 150–400)
RBC: 4.04 MIL/uL — ABNORMAL LOW (ref 4.22–5.81)
RDW: 14.9 % (ref 11.5–15.5)
WBC: 10.8 10*3/uL — ABNORMAL HIGH (ref 4.0–10.5)
WBC: 6.9 10*3/uL (ref 4.0–10.5)

## 2010-12-29 LAB — URINALYSIS, ROUTINE W REFLEX MICROSCOPIC
Ketones, ur: NEGATIVE mg/dL
Nitrite: NEGATIVE
Protein, ur: NEGATIVE mg/dL
Urobilinogen, UA: 0.2 mg/dL (ref 0.0–1.0)

## 2010-12-29 LAB — BASIC METABOLIC PANEL
BUN: 34 mg/dL — ABNORMAL HIGH (ref 6–23)
BUN: 34 mg/dL — ABNORMAL HIGH (ref 6–23)
Calcium: 8.7 mg/dL (ref 8.4–10.5)
Calcium: 8.9 mg/dL (ref 8.4–10.5)
Chloride: 103 mEq/L (ref 96–112)
Chloride: 103 mEq/L (ref 96–112)
Creatinine, Ser: 2.16 mg/dL — ABNORMAL HIGH (ref 0.4–1.5)
Creatinine, Ser: 2.25 mg/dL — ABNORMAL HIGH (ref 0.4–1.5)
Creatinine, Ser: 2.39 mg/dL — ABNORMAL HIGH (ref 0.4–1.5)
Creatinine, Ser: 2.05 mg/dL — ABNORMAL HIGH (ref 0.4–1.5)
GFR calc Af Amer: 32 mL/min — ABNORMAL LOW (ref >60)
GFR calc Af Amer: 34 mL/min — ABNORMAL LOW (ref >60)
GFR calc non Af Amer: 27 mL/min — ABNORMAL LOW (ref >60)
GFR calc non Af Amer: 32 mL/min — ABNORMAL LOW (ref >60)
Glucose, Bld: 150 mg/dL — ABNORMAL HIGH (ref 70–99)
Glucose, Bld: 186 mg/dL — ABNORMAL HIGH (ref 70–99)

## 2010-12-29 LAB — HEMOGLOBIN A1C: Mean Plasma Glucose: 157 mg/dL

## 2010-12-29 LAB — POCT CARDIAC MARKERS
CKMB, poc: 1.1 ng/mL (ref 1.0–8.0)
Myoglobin, poc: 108 ng/mL (ref 12–200)

## 2010-12-29 LAB — BRAIN NATRIURETIC PEPTIDE: Pro B Natriuretic peptide (BNP): 48 pg/mL (ref 0.0–100.0)

## 2011-01-01 LAB — GLUCOSE, CAPILLARY
Glucose-Capillary: 123 mg/dL — ABNORMAL HIGH (ref 70–99)
Glucose-Capillary: 150 mg/dL — ABNORMAL HIGH (ref 70–99)
Glucose-Capillary: 151 mg/dL — ABNORMAL HIGH (ref 70–99)
Glucose-Capillary: 155 mg/dL — ABNORMAL HIGH (ref 70–99)
Glucose-Capillary: 155 mg/dL — ABNORMAL HIGH (ref 70–99)
Glucose-Capillary: 162 mg/dL — ABNORMAL HIGH (ref 70–99)
Glucose-Capillary: 163 mg/dL — ABNORMAL HIGH (ref 70–99)
Glucose-Capillary: 163 mg/dL — ABNORMAL HIGH (ref 70–99)
Glucose-Capillary: 181 mg/dL — ABNORMAL HIGH (ref 70–99)
Glucose-Capillary: 201 mg/dL — ABNORMAL HIGH (ref 70–99)

## 2011-01-01 LAB — BASIC METABOLIC PANEL
BUN: 28 mg/dL — ABNORMAL HIGH (ref 6–23)
BUN: 33 mg/dL — ABNORMAL HIGH (ref 6–23)
Calcium: 8.2 mg/dL — ABNORMAL LOW (ref 8.4–10.5)
Calcium: 8.3 mg/dL — ABNORMAL LOW (ref 8.4–10.5)
Chloride: 100 mEq/L (ref 96–112)
Creatinine, Ser: 2.49 mg/dL — ABNORMAL HIGH (ref 0.4–1.5)
GFR calc Af Amer: 29 mL/min — ABNORMAL LOW (ref >60)
GFR calc non Af Amer: 25 mL/min — ABNORMAL LOW (ref >60)
GFR calc non Af Amer: 25 mL/min — ABNORMAL LOW (ref >60)
Glucose, Bld: 158 mg/dL — ABNORMAL HIGH (ref 70–99)
Glucose, Bld: 200 mg/dL — ABNORMAL HIGH (ref 70–99)
Potassium: 3.3 mEq/L — ABNORMAL LOW (ref 3.5–5.1)
Potassium: 3.7 mEq/L (ref 3.5–5.1)
Sodium: 138 mEq/L (ref 135–145)

## 2011-01-01 LAB — CBC
HCT: 31.1 % — ABNORMAL LOW (ref 39.0–52.0)
HCT: 35.1 % — ABNORMAL LOW (ref 39.0–52.0)
MCV: 92.6 fL (ref 78.0–100.0)
Platelets: 107 10*3/uL — ABNORMAL LOW (ref 150–400)
Platelets: 153 10*3/uL (ref 150–400)
Platelets: 94 10*3/uL — ABNORMAL LOW (ref 150–400)
RBC: 3.36 MIL/uL — ABNORMAL LOW (ref 4.22–5.81)
RDW: 12.8 % (ref 11.5–15.5)
RDW: 12.8 % (ref 11.5–15.5)
RDW: 12.8 % (ref 11.5–15.5)
WBC: 7.9 10*3/uL (ref 4.0–10.5)

## 2011-01-01 LAB — COMPREHENSIVE METABOLIC PANEL
ALT: 16 U/L (ref 0–53)
Albumin: 4.1 g/dL (ref 3.5–5.2)
Alkaline Phosphatase: 42 U/L (ref 39–117)
BUN: 37 mg/dL — ABNORMAL HIGH (ref 6–23)
Potassium: 3.5 mEq/L (ref 3.5–5.1)
Sodium: 140 mEq/L (ref 135–145)
Total Protein: 6.6 g/dL (ref 6.0–8.3)

## 2011-01-01 LAB — URINALYSIS, ROUTINE W REFLEX MICROSCOPIC
Glucose, UA: 100 mg/dL — AB
Hgb urine dipstick: NEGATIVE
Specific Gravity, Urine: 1.008 (ref 1.005–1.030)
pH: 7 (ref 5.0–8.0)

## 2011-01-01 LAB — PROTIME-INR
INR: 1.3 (ref 0.00–1.49)
INR: 1.5 (ref 0.00–1.49)
INR: 1.8 — ABNORMAL HIGH (ref 0.00–1.49)
Prothrombin Time: 19.1 seconds — ABNORMAL HIGH (ref 11.6–15.2)
Prothrombin Time: 20.1 seconds — ABNORMAL HIGH (ref 11.6–15.2)

## 2011-01-15 ENCOUNTER — Other Ambulatory Visit (INDEPENDENT_AMBULATORY_CARE_PROVIDER_SITE_OTHER): Payer: Medicare Other

## 2011-01-15 DIAGNOSIS — I4891 Unspecified atrial fibrillation: Secondary | ICD-10-CM

## 2011-01-15 DIAGNOSIS — E785 Hyperlipidemia, unspecified: Secondary | ICD-10-CM

## 2011-01-15 DIAGNOSIS — T887XXA Unspecified adverse effect of drug or medicament, initial encounter: Secondary | ICD-10-CM

## 2011-01-15 LAB — LIPID PANEL
Cholesterol: 203 mg/dL — ABNORMAL HIGH (ref 0–200)
HDL: 37.8 mg/dL — ABNORMAL LOW (ref >39.00)
Total CHOL/HDL Ratio: 5
Triglycerides: 329 mg/dL — ABNORMAL HIGH (ref 0.0–149.0)
VLDL: 65.8 mg/dL — ABNORMAL HIGH (ref 0.0–40.0)

## 2011-01-15 LAB — BASIC METABOLIC PANEL
Calcium: 9.2 mg/dL (ref 8.4–10.5)
Chloride: 108 mEq/L (ref 96–112)
Creatinine, Ser: 2.7 mg/dL — ABNORMAL HIGH (ref 0.4–1.5)
GFR: 24.01 mL/min — ABNORMAL LOW (ref >60.00)

## 2011-01-15 LAB — HEPATIC FUNCTION PANEL
ALT: 32 U/L (ref 0–53)
Bilirubin, Direct: 0.1 mg/dL (ref 0.0–0.3)
Total Bilirubin: 0.6 mg/dL (ref 0.3–1.2)

## 2011-01-15 NOTE — Patient Instructions (Addendum)
Hold for 2 days then 5 mg only on mondays,2.5 mg on other days,check in 3 weeks

## 2011-01-21 ENCOUNTER — Ambulatory Visit (INDEPENDENT_AMBULATORY_CARE_PROVIDER_SITE_OTHER): Payer: Medicare Other | Admitting: Internal Medicine

## 2011-01-21 ENCOUNTER — Encounter: Payer: Self-pay | Admitting: Internal Medicine

## 2011-01-21 DIAGNOSIS — E119 Type 2 diabetes mellitus without complications: Secondary | ICD-10-CM

## 2011-01-21 DIAGNOSIS — I2510 Atherosclerotic heart disease of native coronary artery without angina pectoris: Secondary | ICD-10-CM

## 2011-01-21 DIAGNOSIS — E785 Hyperlipidemia, unspecified: Secondary | ICD-10-CM

## 2011-01-21 DIAGNOSIS — I4891 Unspecified atrial fibrillation: Secondary | ICD-10-CM

## 2011-01-21 DIAGNOSIS — I509 Heart failure, unspecified: Secondary | ICD-10-CM

## 2011-01-21 DIAGNOSIS — I10 Essential (primary) hypertension: Secondary | ICD-10-CM

## 2011-01-21 LAB — BASIC METABOLIC PANEL
BUN: 56 mg/dL — ABNORMAL HIGH (ref 6–23)
CO2: 24 mEq/L (ref 19–32)
Calcium: 8.9 mg/dL (ref 8.4–10.5)
Creatinine, Ser: 3.1 mg/dL — ABNORMAL HIGH (ref 0.4–1.5)
GFR: 20.74 mL/min — ABNORMAL LOW (ref >60.00)
Glucose, Bld: 173 mg/dL — ABNORMAL HIGH (ref 70–99)

## 2011-01-21 NOTE — Assessment & Plan Note (Addendum)
Clinically stable However he has significant breast tenderness and feels that he is urinating all day long He would like to try to resume furosemide only Discussed potential risks and data behind spirnolactone and CHF  He will change to furosemide after cataract surgery

## 2011-01-21 NOTE — Assessment & Plan Note (Signed)
Some improvement Discussed need for improved He plans on resuming his swimming

## 2011-01-21 NOTE — Patient Instructions (Signed)
5 mg only on mondays,2.5 mg on other days,check in 4 weeks

## 2011-01-21 NOTE — Assessment & Plan Note (Signed)
Currently on no meds.  Will need to discuss He has been intolerant previously

## 2011-01-21 NOTE — Assessment & Plan Note (Signed)
No anginal sxs Continue meds.

## 2011-01-21 NOTE — Progress Notes (Signed)
  Subjective:    Patient ID: [name] [name], male    DOB: 10/06/1930, 76 y.o.   MRN: [id number]  HPI Cataract: scheduled for 5/14 ([place])  CHF--clinically doing well. No swelling, no SOB  AFIB--no palpitations- no bleeding complications.   CAD---no cp, sob, pnd  Past Medical History  Diagnosis Date  . Atrial fibrillation 07/13/2006  . CARDIOMYOPATHY, PRIMARY, DILATED 08/07/2009  . COLON CANCER, HX OF 07/13/2006  . CONGESTIVE HEART FAILURE 07/13/2006  . CORONARY ARTERY DISEASE 07/13/2006  . PROSTATE CANCER, HX OF 07/13/2006  . RENAL DISEASE, CHRONIC, STAGE III 04/18/2009  . SKIN CANCER, HX OF 04/18/2009  . SLEEP APNEA 11/01/2008  . Hypertension   . Hyperlipidemia   . Diabetes mellitus   . Hyperkalemia   . Arthritis    Past Surgical History  Procedure Date  . Colectomy   . Prostatectomy   . Penile prosthesis placement   . Removed prosthetic eye   . Ptca     stent placed  . Total knee arthroplasty   . St jude unify     reports that he quit smoking about 44 years ago. He does not have any smokeless tobacco history on file. He reports that he does not drink alcohol or use illicit drugs. family history includes Heart disease in his father and Throat cancer in his mother. No Known Allergies    Review of Systems  patient denies chest pain, shortness of breath, orthopnea. Denies lower extremity edema, abdominal pain, change in appetite, change in bowel movements. Patient denies rashes, musculoskeletal complaints. No other specific complaints in a complete review of systems.      Objective:   Physical Exam  well-developed well-nourished male in no acute distress. HEENT exam atraumatic, normocephalic, neck supple without jugular venous distention. Chest clear to auscultation cardiac exam S1-S2 are regular 2/6 hsm. Abdominal exam overweight with bowel sounds, soft and nontender. Extremities no edema. Neurologic exam is alert with a normal gait.        Assessment & Plan:

## 2011-01-21 NOTE — Assessment & Plan Note (Signed)
Fair control Continue meds 

## 2011-01-22 ENCOUNTER — Ambulatory Visit (INDEPENDENT_AMBULATORY_CARE_PROVIDER_SITE_OTHER): Payer: Medicare Other | Admitting: Cardiology

## 2011-01-22 ENCOUNTER — Encounter: Payer: Self-pay | Admitting: Cardiology

## 2011-01-22 VITALS — BP 122/70 | HR 72 | Resp 14 | Ht 72.0 in | Wt 187.0 lb

## 2011-01-22 DIAGNOSIS — I2510 Atherosclerotic heart disease of native coronary artery without angina pectoris: Secondary | ICD-10-CM

## 2011-01-22 DIAGNOSIS — I4891 Unspecified atrial fibrillation: Secondary | ICD-10-CM

## 2011-01-22 DIAGNOSIS — I428 Other cardiomyopathies: Secondary | ICD-10-CM

## 2011-01-22 NOTE — Patient Instructions (Signed)
Your physician recommends that you schedule a follow-up appointment in: 2 months with Dr. Stuckey   

## 2011-01-30 ENCOUNTER — Telehealth: Payer: Self-pay | Admitting: Internal Medicine

## 2011-01-30 NOTE — Assessment & Plan Note (Signed)
No recurrent angina at present.  Continue to monitor.

## 2011-01-30 NOTE — Progress Notes (Signed)
HPI:  [name] [name] is in for follow up.  He is really doing pretty well.  He denies much in the way of trouble, despite all of the issues he has.  He is tolerating his meds well, and is following up closely with Dr. Cato Mulligan in the clinic at [facility]  No new issues.  His latest labs are as noted.    His last echo was 12/2009 Study Conclusions    - Left ventricle: The cavity size was normal. Wall thickness was     increased in a pattern of mild LVH. Systolic function was     moderately to severely reduced. The estimated ejection fraction     was in the range of 30% to 35%. Diffuse hypokinesis.   - Aortic valve: Mild regurgitation. Valve area: 2.13cm^2(VTI). Valve     area: 2.3cm^2 (Vmax).   - Mitral valve: Mild regurgitation.   - Left atrium: The atrium was moderately dilated.   - Right atrium: The atrium was moderately dilated.   - Pulmonary arteries: Systolic pressure was mildly increased.  His functional class does not seem to have changed, however.      Current Outpatient Prescriptions  Medication Sig Dispense Refill  . aspirin 81 MG tablet Take 81 mg by mouth daily.        . digoxin (LANOXIN) 0.125 MG tablet Take 125 mcg by mouth daily.        [name] glucose blood (FREESTYLE TEST STRIPS) test strip 1 each by Other route as needed. Use as instructed       . glyBURIDE (DIABETA) 1.25 MG tablet Take 1 tablet (1.25 mg total) by mouth 2 (two) times daily with a meal.  180 tablet  3  . hydrALAZINE (APRESOLINE) 25 MG tablet Take 1 tablet (25 mg total) by mouth 3 (three) times daily.  90 tablet  11  . isosorbide dinitrate (ISORDIL) 30 MG tablet Take 15 mg by mouth daily.       . metoprolol (TOPROL-XL) 100 MG 24 hr tablet Take 100 mg by mouth daily.        . nitroGLYCERIN (NITROSTAT) 0.4 MG SL tablet Place 1 tablet (0.4 mg total) under the tongue every 5 (five) minutes as needed.  50 tablet  3  . potassium chloride SA (K-DUR,KLOR-CON) 20 MEQ tablet Take 20 mEq by mouth 2 (two) times daily.        [name]  spironolactone (ALDACTONE) 25 MG tablet Take 25 mg by mouth daily.        [name] torsemide (DEMADEX) 20 MG tablet Take 1 tablet (20 mg total) by mouth daily.  30 tablet  11  . warfarin (COUMADIN) 5 MG tablet Take 5 mg by mouth as directed. 5mg  Monday and Thursday, 2.5mg  all others        No Known Allergies  Past Medical History  Diagnosis Date  . Atrial fibrillation 07/13/2006  . CARDIOMYOPATHY, PRIMARY, DILATED 08/07/2009  . COLON CANCER, HX OF 07/13/2006  . CONGESTIVE HEART FAILURE 07/13/2006  . CORONARY ARTERY DISEASE 07/13/2006  . PROSTATE CANCER, HX OF 07/13/2006  . RENAL DISEASE, CHRONIC, STAGE III 04/18/2009  . SKIN CANCER, HX OF 04/18/2009  . SLEEP APNEA 11/01/2008  . Hypertension   . Hyperlipidemia   . Diabetes mellitus   . Hyperkalemia   . Arthritis     Past Surgical History  Procedure Date  . Colectomy   . Prostatectomy   . Penile prosthesis placement   . Removed prosthetic eye   . Ptca  stent placed  . Total knee arthroplasty   . St jude unify     Family History  Problem Relation Age of Onset  . Heart disease Father   . Throat cancer Mother     History   Social History  . Marital Status: Married    Spouse Name: N/A    Number of Children: N/A  . Years of Education: N/A   Occupational History  . Not on file.   Social History Main Topics  . Smoking status: Former Smoker    Quit date: 09/22/1966  . Smokeless tobacco: Not on file  . Alcohol Use: No  . Drug Use: No  . Sexually Active:    Other Topics Concern  . Not on file   Social History Narrative  . No narrative on file    ROS: Please see the HPI.  All other systems reviewed and negative.  PHYSICAL EXAM:  BP 122/70  Pulse 72  Resp 14  Ht 6' (1.829 m)  Wt 187 lb (84.823 kg)  BMI 25.36 kg/m2  General: Well developed, well nourished, in no acute distress. Head:  Normocephalic and atraumatic. Neck: no JVD Lungs: Clear to auscultation and percussion. Heart: Normal S1 and S2.  No  murmur, rubs or gallops.  Abdomen:  Normal bowel sounds; soft; non tender; no organomegaly Pulses: Pulses normal in all 4 extremities. Extremities: No clubbing or cyanosis. No edema. Neurologic: Alert and oriented x 3.  EKG:  Atrial fibrillation with ventricular pacing.    ASSESSMENT AND PLAN:

## 2011-01-30 NOTE — Assessment & Plan Note (Signed)
Has backup pacing.  Controlled rate at present.

## 2011-01-30 NOTE — Telephone Encounter (Signed)
All Cardiac faxed to [facility] @  [phone]  01/30/11/km

## 2011-01-30 NOTE — Assessment & Plan Note (Signed)
Followed closely by Dr. Cato Mulligan.  He is managing his electrolytes which is tricky given the various medications.  The patient feels good.  May need to adjust these doses but will defer this to Dr. Cato Mulligan.

## 2011-02-04 NOTE — H&P (Signed)
NAMEWILBERTO, [name] [name]               ACCOUNT NO.:  [id number]   MEDICAL RECORD NO.:  [id number]        PATIENT TYPE:  CINP   LOCATION:                               FACILITY:  [facility]   PHYSICIAN:  Arturo Morton. Riley Kill, MD, FACCDATE OF BIRTH:  1931/07/23   DATE OF ADMISSION:  04/25/2008  DATE OF DISCHARGE:                              HISTORY & PHYSICAL   CHIEF COMPLAINT:  Chest pain.   HISTORY OF PRESENT ILLNESS:  Mr. [name] is a very pleasant gentleman who  is 77 years old.  He had previous cardiac catheterization by me in 2002  and at that time, he had calcification of the right coronary artery  without obstruction.  He had some moderate ostial disease in the  circumflex which had an independent takeoff and he had moderate  scattered plaquing in the mid LAD.  None was critical.  He had reduced  overall LV function but is on the backside of having atrial fibrillation  with poor control of ventricular response.  Recent admission to the  hospital was for chest pain.  He had some lateral T-wave inversion  noted.  Of note, the patient had had a previous baseline creatinine of  1.9, now had a creatinine of 2.7.  Furosemide was held, and he was seen  in consultation by the Renal service.  He is also on Coumadin  anticoagulation for atrial fibrillation.  The patient is stabilized with  negative enzymes, and subsequently after a thorough discussion we  elected to recommend conservative management.  He comes back into the  office for followup.  Last week, he was seen and his rate was somewhat  faster with a heart rate of 100.  Diltiazem was re-added to his regimen  in a somewhat lower dose, and after the diltiazem he gets moderately  slow.  EKG today reveals significant new inferolateral T-wave inversion,  and this is more pronounced in the last study.  As a result, we elected  to recommend readmission to the hospital in anticipation of  catheterization.  We had stopped his warfarin  anticoagulation on Sunday  as he had anticipated the possibility of needing catheterization.   PAST MEDICAL HISTORY:  1. Obstructive sleep apnea with CPAP.  2. Atrial fib.  3. Chronic anticoagulation therapy.  4. Previous unsuccessful cardioversion.  5. History of prostate cancer status post prostatectomy.  6. History of colon cancer status post colectomy.  7. History of skin cancer.  8. Hypertension.  9. Dyslipidemia.  10.Diabetes.   Last hemoglobin A1c 7.9.   The patient has had intolerance to BETA-BLOCKADE in the past with  fatigue and tiredness, and he could not stand it and therefore he has  been treated with calcium antagonists in the interim.   SOCIAL HISTORY:  The patient lives in [place] with his wife, and  he is retired.  He quit smoking 15 years ago.   FAMILY HISTORY:  Noncontributory.   REVIEW OF SYSTEMS:  Over the last few days, the patient has had  increasing lower extremity edema for which he has reinstituted his  therapy.   PHYSICAL EXAMINATION:  GENERAL:  Alert and oriented in no distress.  SKIN:  New erythema involving bilaterally of the lower extremities.  The  etiology of this remains unclear.  VITAL SIGNS:  Blood pressure is 138/73 and pulse is 53.  LUNGS:  Fields are clear to auscultation and percussion.  CARDIAC:  Irregularly irregular.  EXTREMITIES:  Edema 1 to 2+ bilaterally, with somewhat petechial  hemorrhage involving both legs.   EKG now reveals possible old inferior infarct, although it is likely  related to left axis deviation, inferior infarct cannot be excluded.  There is delay in R-wave progression, and there is T-wave inversion in  the inferolateral leads.   IMPRESSION:  1. Probable unstable angina and/or non-ST-elevation myocardial      infarction.  2. Chronic renal insufficiency superimposed on chronic kidney disease.  3. Lower extremity edema, possibly related to calcium channel      antagonists.  4. Atrial fibrillation  with poor control of ventricular rate with slow      and fast rates.  5. Hypertension.  6. Dyslipidemia.  7. Diabetes mellitus.  8. History of colon cancer status post colectomy.  9. History of prostate cancer status post prostatectomy.  10.History of skin cancer status post resection.   PLAN:  1. We will admit the patient to the hospital.  2. We will get serial enzymes.  3. His basic laboratory studies will be obtained.  When the INR is      less than 2, heparin therapy will be initiated.  4. We will try to optimize his renal function and when this is      optimized, plan cardiac catheterization to better identify anatomy.      This has been thoroughly discussed with the patient in detail      already, and he understands this.      Arturo Morton. Riley Kill, MD, [facility]  Electronically Signed     TDS/MEDQ  D:  04/25/2008  T:  04/26/2008  Job:  [id number]

## 2011-02-04 NOTE — Discharge Summary (Signed)
[name] [name], [name] [name]               ACCOUNT NO.:  [id number]   MEDICAL RECORD NO.:  [id number]          PATIENT TYPE:  INP   LOCATION:  5019                         FACILITY:  [facility]   PHYSICIAN:  Vania Rea. Supple, M.D.  DATE OF BIRTH:  10-05-1930   DATE OF ADMISSION:  01/11/2009  DATE OF DISCHARGE:  01/15/2009                               DISCHARGE SUMMARY   ADMISSION DIAGNOSES:  1. End-stage osteoarthrosis, right knee.  2. Coronary artery disease, status post stent in 2009.  3. Hypertension.  4. Atrial fibrillation.  5. Hyperlipidemia.  6. Sleep apnea.  7. Chronic renal insufficiency.  8. Sleep apnea.   DISCHARGE DIAGNOSES:  1. End-stage osteoarthrosis, right knee.  2. Coronary artery disease, status post stent in 2009.  3. Hypertension.  4. Atrial fibrillation.  5. Hyperlipidemia.  6. Sleep apnea.  7. Chronic renal insufficiency.  8. Postoperative urinary retention.  9. Status post right total knee arthroplasty.  10.Postoperative hypokalemia.  11.Sleep apnea.   PROCEDURE:  Right total knee arthroplasty.   SURGEON:  Vania Rea. Supple, MD   ASSISTANT:  Lucita Lora. Shuford, PA-C   ANESTHESIA:  Femoral nerve block.   CONSULTANTS:  [facility], primary care doctors.   BRIEF HISTORY:  [name] is a pleasant 77 year old gentleman been  followed as an outpatient now for ongoing difficulties with his right  knee.  He has failed outpatient conservative management.  At this time,  he has bone-on-bone deformity and significant pain, functional  disability because of his right knee osteoarthrosis.  At this time he  wished to proceed with right total knee arthroplasty as indicated.  Risks and benefits discussed and he was in agreement, wished to proceed.   HOSPITAL COURSE:  The patient was admitted, underwent above named  procedure and tolerated this well.  All appropriate IV antibiotics and  analgesics were utilized.  Postoperatively, he was placed back on  Coumadin, which he takes  chronically for his atrial fib and we did  provide Lovenox for bridging.  We did call Medicine to help cover.  He  was having some difficulties with mild chronic renal insufficiency.  They did follow along with [place].  The patient remained hemodynamically  stable.  He was felt to be stable from his atrial fibrillation  standpoint as well.  He was began working with physical therapy and  tolerated weightbearing as tolerated and met his therapy goals.  He  continued to have difficulty with postoperative urinary retention  requiring in and out cath and ultimately fully replacement.  On date,  January 15, 2009, the patient was afebrile.  Vital signs were stable.  He  was admitted of his therapy goals.  His only difficulty was that of  urinary retention.  We called urologist outpatient, Dr. Retta Diones who  agreed to see patient in outpatient and recommended Foley placement and  leg bag for him to go home.  The patient was familiar with this as he  had previously had a self cath himself after prior surgeries remotely.  At this time, he was stable for discharge to home.   LABORATORY DATA:  Admission hemoglobin was 15.4, postoperative 12.3,  11.6, and 10.7.  Multiple INRs found in the chart followed by Pharmacy.  Chemistries showed some again mild renal insufficiency, but was stable  throughout his hospitalization.  See chart for details.   CONDITION ON DISCHARGE:  Stable on improved.   DISCHARGE PLANS:  The patient may discharged to home.  Resume his home  meds including Coumadin.  Percocet and Robaxin are provided.  Follow up  with Dr. Retta Diones.  Call for appointment in the next 2 days.  May  shower on day 5.  May be weightbearing as tolerated.  Home health, PT,  OT are arranged.  We will see back in our office 2 weeks  postoperatively.  Call for time.      Tracy A. Shuford, P.A.-C.      Vania Rea. Supple, M.D.  Electronically Signed    TAS/MEDQ  D:  03/15/2009  T:  03/16/2009  Job:   [id number]

## 2011-02-04 NOTE — Letter (Signed)
November 30, 2008    Bruce H. Swords, MD  [address] [place], [place] [address]   RE:  [name], [name]  MRN:  [id number]  /  DOB:  June 29, 1931   Dear Smitty Cords,   I had the pleasure of seeing [name] [name] in the office today in a  followup visit following his recent Myoview.  As you know, his primary  concern has been weight gain.  He has had a fair amount of weight gain  as well as some increase in his lower extremity edema.  He has also been  having a somewhat rising creatinine.  He has also had his desire to have  a knee operation.  I know that much of this has been predicated upon his  cardiac status.  With his weight gain, he has had a decided decrease in  his overall exercise tolerance.  I know you have also been adjusting his  medications.   Today, he underwent radionuclide imaging.  Perfusion to the anterior  wall, the region of stenting looks good on the preliminary studies.  We  will await the final report, but it seem reasonably pleased with this.   I have made some specific adjustments in his medications.  I asked him  to discontinue his amlodipine.  Part of this was because of swelling in  his feet.  As a result, we increased his metoprolol 50 mg twice daily to  compensate.  We have also increased his hydralazine to a 2-time-a day  basis.  He has been maintained on furosemide and potassium at the same  dose, and I have asked him to follow up with you closely within the next  few weeks.  Obviously, some studies may need to be done to assess a  variety of things including thyroid function with his recent weight  gain.  He did have an echocardiogram last July, but if he continues have  trouble after adjustment of medications it might be reasonable to repeat  this to reassess his left ventricular function.  His LV did appear  dilated on the current Myoview as it did on the previous one however and  I suspect that this probably is not substantial change.   I have not  made a followup appointment because you follow him so  closely, but we can discuss his progress.   I appreciate the opportunity of sharing in his care.    Sincerely,      Arturo Morton. Riley Kill, MD, [facility]  Electronically Signed    TDS/MedQ  DD: 12/01/2008  DT: 12/01/2008  Job #: [id number]

## 2011-02-04 NOTE — Consult Note (Signed)
[name] [name], [name] [name]               ACCOUNT NO.:  [id number]   MEDICAL RECORD NO.:  [id number]          PATIENT TYPE:  INP   LOCATION:  2040                         FACILITY:  [facility]   PHYSICIAN:  Aram Beecham B. Eliott Nine, M.D.DATE OF BIRTH:  [birth year]   DATE OF CONSULTATION:  04/13/2008  DATE OF DISCHARGE:                                 CONSULTATION   We are asked to see this patient with chronic kidney disease who is  possibly in need of a cardiac catheterization.  He is a 77 year old  white male who was admitted via the [facility] Cardiology Service with  chest and arm discomfort.  He has a known history of atrial fibrillation  and known left ventricular dysfunction with an EF of 36% in 2002 at the  time of a heart catheterization and presently about 40-45% by  echocardiogram.  Apparently, there was no substantial coronary disease  in 2002.  During this current admission, his enzymes have been negative,  and his chest pain has resolved.  There is concern about doing a  catheterization because of his CKD.   He has had an elevated serum creatinine since at least 2007 based on  laboratory data gleaned from the E-chart.  Specifically, creatinine was  1.7 in July 2007, 2.0 in December 2007, and 1.9 in April 2008.  Serum  creatinine on admission was 2.42 and has ranged anywhere from 2.3 to 2.4  during this admission.  His urinalysis shows 100 mg% protein by  dipstick, and his sediment is fairly unremarkable with 3-6 white cells  and 2-4 red cells.  He had a renal ultrasound ordered by Dr. Cato Mulligan back  in 2006 which showed kidneys measuring 12.3 and 12.4 cm bilaterally  which were deemed very echogenic, and there were cysts noted.  MRI of  the kidneys at that time showed numerous benign-appearing cysts as  well as innumerable 1-2 mm cysts bilaterally.   The patient reports that thinks he saw a nephrologist when he was living  in [place] some time back but he cannot recall why.   He is  currently receiving and has been on both ACE inhibitor and ARB  therapy for control of hypertension.   PAST MEDICAL HISTORY:  1. Coronary disease with left ventricular dysfunction, with minimal      disease by catheterization in 2002.  2. History of atrial fibrillation with prior attempts at cardioversion      which were unsuccessful.  3. Chronic anticoagulation secondary to atrial fibrillation.  4. Obstructive sleep apnea, on CPAP.  5. Hypertension for approximately 40 plus years per patient.  6. Dyslipidemia.  7. Diabetes, non-insulin requiring, also for about 40 years per the      patient, and he believes he has been on medication for 10-20 years,      with no complications of retinopathy or peripheral neuropathy.   He has no known drug allergies.   His current medicines are:  1. Norvasc 5 mg a day.  2. Aspirin 81 mg a day.  3. Diltiazem 240 mg a day.  4. His Lasix 40 mg a day is  on hold.  5. NovoLog insulin on a sliding scale.  6. Lisinopril 20 mg a day.  7. Benicar 40 mg a day.  8. Protonix 40 mg a day.  9. Potassium 20 mEq twice a day.  10.Zocor 40 mg a day.  11.Januvia 50 mg a day.  12.Coumadin.  13.He has a p.r.n. order for Toradol.   Review of systems is positive for some vague discomfort in his left arm  prior to admission as well as some poorly-described left chest  discomfort and significant fatigue which he experienced when he was  working on the day of admission.  He has been having swelling in both  lower extremities for some time and takes diuretics as an outpatient for  this.  He has had no hematuria, dysuria, or voiding problems.  He has  never passed a kidney stone.   PHYSICAL EXAMINATION:  He is an extremely nice articulate elderly  gentleman.  Blood pressure was 164/98, heart rate was 120 and  irregularly irregular.  Skin shows multiple moles, skin tags, and  actinic lesions.  His carotids were free of bruits.  There was no  discernible neck vein  distention.  Lungs were clear.  Cardiac rhythm was  irregular, S1 and S2, no audible S3.  He had a protuberant abdomen with  normally active bowel sounds.  There was no focal organomegaly and no  tenderness.  Extremities showed pigment changes consistent with old  hemosiderin deposits, and he had trace pretibial edema.  His dorsalis  and posterior tibial pulses were present but faint.   Laboratory:  Sodium 140, potassium 4.5, chloride 109, CO2 of 25, BUN 33,  creatinine 2.3 with an EGFR of 28-30, calcium 8.8, hemoglobin 13.8, and  platelets 128,000.  Urinalysis showed 100 mg% protein, 3-6 white cells,  and 0-2 red cells.   Renal ultrasound is pending.  Renal ultrasound from 2006 showed  bilaterally echogenic kidneys with some cysts, not all of which could be  characterized as simple.  MRA of the abdomen done in 2006 showed  numerous bilateral renal cysts and innumerable small cysts bilaterally  with multiple larger cysts.   IMPRESSION:  A 77 year old gentleman with longstanding diabetes and  hypertension, who has chronic kidney disease III/IV and by creatinine  data appears to be progressive since at least 2007.  This could  certainly be nephrosclerosis from hypertension plus or minus a component  of diabetes, and I cannot exclude the possibility that he has underlying  polycystic disease based on prior MRA scan, although there is no family  history.  I suspect but cannot prove, because I do not have recent labs  from Dr. Cato Mulligan' office, that low 2's may be his new baseline.  He has  no evidence of accompanying anemia.  He does need screening intact  parathyroid hormone level as well as an SPEP and UPEP for completeness.  The urine protein:creatinine ratio would help estimate the degree of  proteinuria.   If a cardiac catheterization is required, I would recommend  administration of IV fluids, holding both his ACE inhibitor and his ARB  for 24 hours prior to and at least 24-48 hours  post catheterization and  utilizing Mucomyst, would recommend stopping nonsteroidals, and would  not use medications such as Toradol for relief of pain.   The patient would merit outpatient renal followup after discharge, and I  would be happy to follow him for his stage III/IV chronic kidney  disease.   Thanks for the  consult, and we will follow with you.      Duke Salvia Eliott Nine, M.D.  Electronically Signed     CBD/MEDQ  D:  04/13/2008  T:  04/14/2008  Job:  [id number]   cc:   Arturo Morton. Riley Kill, MD, [facility]  [facility]  Dr. Riley Kill

## 2011-02-04 NOTE — Letter (Signed)
June 29, 2008    Bruce H. Swords, M.D.  [address] [place], [place] [address]   RE:  [name] [name], [name] [name]  MRN:  [id number]  /  DOB:  1931-01-02   Dear Smitty Cords:   I had the pleasure of seeing [name] [name], he was in the office today in  followup.  Symptomatically, he really is doing well.  He is quite  pleased with how he has been.  I know you have been adjusting his  medications.  This seems to have helped, the edema has dramatically  resolved.  He tells me that he has been checking his labs on a regular  basis.  So therefore we did not make any effort to look into this today.   CURRENT MEDICATIONS:  1. Amlodipine 5 mg daily.  2. Potassium 20 mEq daily.  3. Lopressor 50 mg in morning and 25 mg in the p.m.  4. Lisinopril 40 mg daily.  5. Lasix 20 mg daily.  6. Spironolactone 25 mg daily.  7. He is also on Coumadin, simvastatin, glyburide and enteric-coated      aspirin.   DICTATION ENDED AT THIS POINT    Sincerely,      Arturo Morton. Riley Kill, MD, [facility]    TDS/MedQ  DD: 06/29/2008  DT: 06/29/2008  Job #: [id number]

## 2011-02-04 NOTE — Letter (Signed)
January 05, 2009    Vania Rea. Supple, M.D.  [address]  [place], [place] [address]   RE:  [name] [name], [name] [name]  MRN:  [id number]  /  DOB:  1930-10-09   Dear Caryn Bee:   Mr. Gascoigne was seen recently in cardiac follow-up.  From a pure cardiac  standpoint, he has remained relatively stable.  In order to assess his  status, he underwent radionuclide imaging on December 04, 2008.  This  revealed decreased activity in the inferior wall and apex, but with no  evidence of ischemia.  The patient has a known coronary artery disease.  He last underwent cardiac catheterization in August of 2009.  At that  time, there was an 80% stenosis in the distal LAD.  The right coronary  artery did not have significant and critical disease.  Because of his  persistent atrial fibrillation, he underwent treatment of this with a  non drug-eluting stent.  At that time, he was treated with the 15 x 2.5  Mini Vision stent, and was placed on aspirin and Plavix for  approximately 4 weeks.  Because of his atrial fibrillation, he has gone  back on Coumadin.  Importantly, the patient has had a myocardial  perfusion imaging in the setting of diabetes, and fortunately this  demonstrates no perfusion defect in the anterior wall.  So thus, from a  cardiac standpoint he appears to be stable.   That said, he has had persistent problems with hypertension which Birdie Sons has been very carefully managing.  Multiple adjustments have been  made in order to try to optimize this.  Bruce follows his creatinine  closely, and it should be noted that his creatinine has been rising  recently.  He has had some problem with some volume overload.   Previous echocardiography done in July of 2009, did reveal an  inferoposterior wall motion abnormality with an ejection fraction  estimate in the range of approximately 45%.  The aortic valve thickness  was mildly increased, but there was no significant aortic stenosis.   The patient should remain on beta-blockers throughout his surgery.  With  regard to his renal status, I would check with Bruce, as this will need  to be monitored carefully during and after surgery to prevent  perioperative problems.  Thanks and we will be glad to see him with you  while he is hospitalized.    Sincerely,      Arturo Morton. Riley Kill, MD, [facility]  Electronically Signed    TDS/MedQ  DD: 01/05/2009  DT: 01/05/2009  Job #: [id number]

## 2011-02-04 NOTE — H&P (Signed)
NAMEKHOEN, GENET               ACCOUNT NO.:  [id number]   MEDICAL RECORD NO.:  [id number]          PATIENT TYPE:  INP   LOCATION:  1823                         FACILITY:  [facility]   PHYSICIAN:  Thomas C. Wall, MD, FACCDATE OF BIRTH:  [birth year]   DATE OF ADMISSION:  04/04/2009  DATE OF DISCHARGE:                              HISTORY & PHYSICAL   PRIMARY CARDIOLOGIST:  Arturo Morton. Riley Kill, MD, [facility]   PRIMARY CARE PHYSICIAN:  Valetta Mole. Swords, MD   RENAL:  Duke Salvia. Eliott Nine, MD   Mr. Finigan is a 77 year old Caucasian gentleman with known history of  coronary artery disease status post cardiac catheterization with PCI in  August 2009.  The patient had 80% stenosis to the distal LAD, underwent  placement of a bare-metal stent.  He had a stress Myoview in March of  this year preoperatively for knee surgery.  No ischemia at that time, EF  40-45%.  He also has known stage III chronic renal insufficiency with a  baseline creatinine around 2 in the setting of chronic atrial  fibrillation and unsuccessful cardioversions in the past.  Mr. Stites  presented to [facility] Emergency Room last night complaining of sudden  onset of shortness of breath.  There is no formal documented diagnosis  of congestive heart failure in Dr. Rosalyn Charters notes.  Dr. Cato Mulligan does  call attention to that diagnosis in his office notes.  Apparently Mr.  Wolfinger has been on Lasix for quite some time and states he has had  problems with fluid in the past, but no one has ever told him he has  heart failure.  Around 10 o'clock last night he went to bed, states he  could not get comfortable, unable to lie flat comfortably.  He put on  his CPAP which he wears at bedtime for his sleep apnea.  He states he  felt like he was suffocating with the CPAP on.  He sat up and moved  around, but did not feel any better, in fact his breathing got more  labored, and he came to the ER for further evaluation.  In the ER, chest  x-ray showed  pulmonary edema.  The patient responded well to 80 mg of  Lasix IV push.  He was also given nitroglycerin sublingual, although the  patient states he did not have any chest discomfort, but his initial  blood pressure was documented as 162/104.  He was also given Tylenol for  headache with much improvement in his shortness of breath with the  Lasix.  He currently is lying about 30 degree angle comfortably;  however, with minimal exertion he is becoming short of breath again.  Mr. Neidig states he has had some swelling in his right lower extremity,  but he contributed this to his recent knee surgery in April of this  year.  He usually judges his fluid status by his left lower extremity.  He does not weigh consistently.  He states he does not normally a lot of  salt anyway even though he did not know he was supposed to be  restricting his  sodium intake.  He does admit to some abdominal fullness  and bloating over the last week or so.  Mr. Gautreau also states he has  not been eating well, in fact since his knee surgery in April he has  lost 30 pounds.  He contributes this weight loss to decreased appetite  associated with increased narcotic use after his surgery.  He states he  just stopped the oxycodone recently and got his appetite back, but  during the last few days, his appetite has decreased as abdominal  bloating has increased.  Mr. Raia has previously been evaluated by Dr.  Eliott Nine, in fact he had a renal ultrasound back in July 2009 that showed  multiple bilateral renal cysts.   PAST MEDICAL HISTORY:  1. Coronary artery disease status post cardiac catheterization.  PCI      in August 2009, 80% stenosis to the distal LAD with placement of a      bare-metal stent, status post stress Myoview on December 04, 2008,      which revealed decreased activity in the inferior wall and apex,      but no evidence of ischemia.  Also at the time of catheterization      in August 2009, the patient's  right coronary artery did not have      significant critical disease.  2. Atrial fibrillation, chronic with anticoagulation therapy.  3. Hypertension, persistent problems with management in the past      requiring adjustments in medications.  4. Chronic kidney disease, stage III with a baseline creatinine around      2.  5. Most recent echocardiogram in July 2009, posterior wall motion      abnormality with EF of 45%.  6. Obstructive sleep apnea with CPAP compliance.  7. Dyslipidemia.  8. History of prostate cancer status post prostatectomy.  9. History of colon cancer status post colectomy.  10.Borderline diabetes, although the patient states he does not check      CBGs and is not on any medications for this.  11.Osteoarthritis status post right knee surgery in April 2010.  12.Hypertension.   SOCIAL HISTORY:  The patient lives here in [place] with his wife.  He  is retired from [employer].  He quit smoking 15 years ago.  Denies any  recreational or alcohol use.  Tries to follow a heart-healthy diet.  For  exercise, he is actually participating in rehab post knee surgery 2 days  a week and then also does some exercises in range of motion on his own.   FAMILY HISTORY:  Positive for coronary artery disease in his father,  otherwise noncontributory.   REVIEW OF SYSTEMS:  Positive for shortness of breath, dyspnea on  exertion, orthopnea, lower extremity edema, cough, nocturia, decreased  range of motion, and pain in right knee status post surgery, myalgia,  arthralgias in joints, and decreased appetite with abdominal bloating  over the last few days.  All other systems reviewed and negative.   ALLERGIES:  No known drug allergies.   MEDICATIONS:  1. Coumadin.  2. Hydralazine 25 mg b.i.d.  3. Metoprolol 25 mg b.i.d.  4. Aspirin 81.  5. Lasix 80 mg daily.  6. KCl 40 mEq daily.  7. Diltiazem 120 daily.   PHYSICAL EXAMINATION:  VITAL SIGNS:  Temperature 97.4, heart rate   ranging from 94-112 currently, respirations 18-22, blood pressure  initially was 162/104, currently is 138/89, sat 95% on room air.  GENERAL:  He is in no acute  distress.  He is tachypneic with minimal  exertion.  HEENT:  Unremarkable.  NECK:  Supple without lymphadenopathy or bruits.  He has JVD around 10  or 12 cm at 45-degree angle.  LUNGS:  Distant breath sounds throughout.  I do not hear any obvious crackles.  CARDIOVASCULAR:  S1 and S2, irregular rhythm.  SKIN:  Warm and dry.  ABDOMEN:  Soft, nontender, distended.  Positive bowel sounds.  LOWER EXTREMITIES:  Without clubbing, cyanosis.  He has +2 pitting edema  in the right lower extremity and 1+ pitting edema to the left.  NEUROLOGIC:  Alert and oriented x3.   Chest x-ray showing mild-to-moderate pulmonary edema.  EKG, atrial fib  with PVCs at a rate of 73.   LABORATORY DATA:  Point of care negative x1.  INR is 2.0.  BNP initially  is 48 in the setting of a creatinine of 2.05, glucose of 186, potassium  of 4.0, hematocrit 40.5.  Urinalysis is negative.   IMPRESSION:  Acute congestive heart failure exacerbation.  This does not  appear to be a new diagnosis to the patient, most likely secondary to  nonischemic cardiomyopathy.  In the setting of hypertension and chronic  atrial fibrillation, I have to wonder if his atrial fib is more of a  contributing factor than originally felt.  The patient however was  hypertensive on arrival today.  Will need to be admitted, cycle enzymes,  and rule out for myocardial infarction also.  We will plan on repeating  his 2-D echocardiogram while here.  For now, we will continue the  metoprolol and Cardizem; however, these medications may need to be  changed to more appropriate medications in the setting of diagnosis of  heart failure.  Also, the patient with chronic kidney disease stage III  with a creatinine of 2.0 at this time.  We will need to monitor renal  function closely.  May need to  have Dr. Eliott Nine involved in the patient's  care.  Also diabetic with a CBG of 186 currently.  The patient states he  has not been on any medications for his diabetes and thought that it was  well controlled.  Also with known coronary artery disease.  The patient  denies any symptoms suggestive of angina.  Atrial fib, rates are running  a little faster than it needs to be, suspect this may be contributing to  his heart failure also.  We will speak with Dr. Riley Kill concerning  further recommendations for atrial fibrillation.  May ask Dr. Hillis Range to evaluate the patient also for EP consult.  For now, we will  admit the patient, gently diurese, monitor kidney function closely, rule  out for myocardial infarction, check a 2-D echocardiogram, and urine  sodium and follow closely.   Dr. Juanito Doom has been into examine and assess and the patient agrees  with plan of care.      Dorian Pod, ACNP      Jesse Sans. Daleen Squibb, MD, [facility]  Electronically Signed    MB/MEDQ  D:  04/04/2009  T:  04/04/2009  Job:  [id number]

## 2011-02-04 NOTE — Cardiovascular Report (Signed)
NAMEALPHONSA, [name] [name]               ACCOUNT NO.:  [id number]   MEDICAL RECORD NO.:  [id number]          PATIENT TYPE:  INP   LOCATION:  2926                         FACILITY:  [facility]   PHYSICIAN:  Arturo Morton. Riley Kill, MD, FACCDATE OF BIRTH:  [birth year]   DATE OF PROCEDURE:  05/01/2008  DATE OF DISCHARGE:                            CARDIAC CATHETERIZATION   INDICATIONS:  Mr. Froman is a complicated gentleman.  He has chronic  kidney disease and underlying diabetes.  The patient has recently had  some recurrent episodes of substernal chest discomfort.  He has a  moderate stenosis of the diagonal, and a fairly high-grade stenosis of  the LAD.  In addition, he has a resting creatinine of approximately 2.  He also has chronic atrial fibrillation, and is on long-term warfarin  anticoagulation.  He was initially treated medically, but then  subsequently presented and had a marked T-wave inversion in the  anteroapical leads and recurrent symptoms.  The EKG revealed some  inferolateral T-wave inversion.  Based on the echocardiogram, the  assumption was that it likely represented the LAD, although the diagonal  could not be excluded.  Nonetheless angiographically we felt that the  LAD should be dilated.  Because of his creatinine of 2, he initially  underwent diagnostic catheterization with limited contrast.  He was  brought back today for repeat study.  Importantly, over the weekend, the  patient developed recurrent symptoms with exertion, and had to be  transferred down to the Coronary Care Unit was placed on intravenous  nitroglycerin.  He was also given clopidogrel.  His symptoms settled  down and his enzymes were essentially negative.  He was brought to the  lab today after extensive discussion with the patient and an appropriate  informed consent.   PROCEDURE:  Percutaneous stenting of the left anterior descending with a  non-drug-eluting platform.   DESCRIPTION OF PROCEDURE:  The patient  was brought to the Cath Lab and  prepped and draped in the usual fashion.  Versed of 1 mg and 25 of  fentanyl were administered.  Through an anterior puncture, left femoral  artery was entered on a first stick.  A 6-French sheath was placed.  We  then used a JL-4 guide.  Bivalirudin was given according to protocol and  ACT checked and found to be adequate for percutaneous intervention.  A  Prowater wire was then fashioned to cross down into the LAD and we  carefully placed a 2.5 x 12 Apex balloon.  We did this with limited  contrast.  The balloon was inflated to 5 atmospheres and demonstrated  good opening.  There was calcification at the lesion site and we were  concerned that it might not open well but it did open adequately.  After  careful thought and review, we elected to place a non-drug-eluting  stent.  Specifically, the patient is on chronic warfarin therapy.  The  lesion fortunately is not long, and the vessel was not extremely small.  He is diabetic.  Given these factors, it was felt that the best option  would be to place a  short non-drug-eluting stent and as such a 15 x 2.5  Mini Vision stent was placed and then deployed at 14 atmospheres.  We  then postdilated with a 3-mm balloon, being specific not to dilate as  much of the proximal lesion as it impinged on the diagonal which was a  large vessel, and with his own ostial disease of 70-80%.  There was an  excellent angiographic response.  We limited the contrast and used a 26  mL of total contrast.  There were no major complications with the  procedure.  He is taken to the holding area in satisfactory clinical  condition after removal of the guide, and sewing in the femoral sheath.  There was a small amount of oozing, the blood pressure was elevated and  we did administer intravenous hydralazine on two occasions to try to  bring the pressure down.  Overall, he tolerated this well.   ANGIOGRAPHIC DATA:  The LAD demonstrates  about an 80% stenosis and a  diffusely calcified vessel after two diagonals, the first diagonal has  about 70-80% ostial narrowing but appears relatively smooth.  The lesion  itself in the LAD has about 80-90% narrowing and the area proximal to it  has some modest narrowing at the bifurcation, however it is not high-  grade.  Following stenting this, the stented lesion was reduced to 0%  with a really and excellent angiographic appearance.  There were no  major complications.  There was no distal embolization.   CONCLUSION:  Successful percutaneous stenting of the mid left anterior  descending artery with a non-drug-eluting platform for the reasons  stated above.   PLAN:  The patient will be followed.  He will be restarted back on  warfarin within 2-3 days.  He will remain on aspirin and Plavix for  approximately 4 weeks.  After 4 weeks, either the aspirin or Plavix  would be dropped, probably the aspirin to 81 mg and the warfarin with a  goal of INR of 2-3.      Arturo Morton. Riley Kill, MD, [facility]  Electronically Signed     TDS/MEDQ  D:  05/01/2008  T:  05/01/2008  Job:  [id number]   cc:   Valetta Mole. Swords, MD  [facility]

## 2011-02-04 NOTE — Op Note (Signed)
[name] [name], [name] [name]               ACCOUNT NO.:  [id number]   MEDICAL RECORD NO.:  [id number]          PATIENT TYPE:  INP   LOCATION:  5019                         FACILITY:  [facility]   PHYSICIAN:  Vania Rea. Supple, M.D.  DATE OF BIRTH:  1930-11-15   DATE OF PROCEDURE:  01/11/2009  DATE OF DISCHARGE:                               OPERATIVE REPORT   PREOPERATIVE DIAGNOSIS:  End-stage right knee osteoarthritis.   POSTOPERATIVE DIAGNOSIS:  End-stage right knee osteoarthritis.   PROCEDURE:  Cemented right total knee arthroplasty utilizing a size 5  femur and size 5 tibia from the DePuy Sigma system.  A 38-mm patellar  button and a 10-mm thick rotating BioForm polyethylene insert.   SURGEON:  Vania Rea. Supple, M.D.   Threasa HeadsFrench Ana A. Shuford, P.A.-C.   ANESTHESIA:  General endotracheal as well as a femoral nerve block.   TOURNIQUET TIME:  1 hour 7 minutes.   ESTIMATED BLOOD LOSS:  200 mL.   DRAINS:  Hemovac x1.   HISTORY:  [name] [name] is a 77 year old gentleman who has had  progressively increasing right knee pain as well as functional  limitations secondary to end-stage osteoarthritis.  He was brought to  the operating room at this time for a planned right total knee  arthroplasty.   Preoperatively, I counseled [name] [name] on treatment options as well as  risks versus benefits thereof.  Possible surgical complications of  bleeding, infection, neurovascular injury, DVT, PE as well as persistent  pain and possible need for revision surgery are reviewed.  He  understands, accepts, and agrees with our planned procedure.   PROCEDURE IN DETAIL:  After undergoing routine preop evaluation, the  patient received prophylactic antibiotics and a femoral nerve block was  established in the holding area with the anesthesia department.  Placed  supine on the operative table and underwent smooth induction of general  endotracheal anesthesia.  Tourniquet was applied in the right thigh, a  Foley catheter was placed, and the right lower extremity was then  sterilely prepped and draped in standard fashion.  A time-out was  called.  The leg was then exsanguinated with the tourniquet inflated to  350 mmHg.  An anterior midline incision was then made, approximately 15  cm in length centered over the patella.  Skin flaps were  circumferentially mobilized and a medial parapatellar arthrotomy was  then performed with the patella averted and the majority of the fat pad  excised.  The knee was then flexed up and a drill was used to gain  access to the femoral medullary canal.   We resected an 11 mm of bone at a 5-degrees valgus cut from the distal  femur.  The distal femur was incised with a size 5 having the best  coverage and fit.  The size 5 femoral cutting guide was then applied and  the appropriate cuts were made.  A trial was placed and it showed  excellent fit.  Attention was then turned to the proximal tibia where  the remnants of the menisci were removed.  Using an extramedullary  guide, we performed  a 10 mm resection from the proximal tibia using the  cutting guide for proper varus-to-valgus alignment.  The proximal tibia  was then incised with a size 5 providing the best coverage.  The trial  implant was then pinned into position, and then we performed a reduction  showing excellent soft tissue balancing and good overall alignment.   We then performed terminal preparation of the proximal tibia with the  reamer and broach.  The attention was then returned to the distal femur  where we used the box cutting guide to resect with the box cut  resection, and then the box trial was again placed showing excellent  fit.  We turned our attention to the patella and resected approximately  9 mm of bone for proper fit of the 38 mm patellar button.  The  stabilizing drill holes were then placed.  We then resected residual  soft tissue debris from the posterior compartments, and we  removed the  bony debris from the posterior aspect of the femoral condyles.  The  joint was then pulsalitely lavaged and meticulously cleaned.  The cement  was then mixed at the appropriate consistency; and all implants were  then cemented into position beginning with tibia, then the femur, and  then the patella.  Meticulous removal of all extra cement was then  completed.   We then confirmed an appropriate soft tissue balance.  I did perform a  slight medial release.  The size 10 tibial tray had the best soft tissue  balance, and excellent fit with full knee motion.  The final size 10  tray was then introduced and final range of motion showed excellent knee  stability with good motion and good balance.  At this point, the  tourniquet was then laid down.  Hemostasis was obtained.  We did perform  a lateral release to help improve patellar alignment.  A Hemovac drain  was then brought out superolaterally and the parapatellar arthrotomy was  closed with interrupted figure-of-eight #1 Vicryl sutures.  A 2-0 Vicryl  was used for the subcu and intracuticular 3-0 Monocryl for the skin  followed by Steri-Strips.  Dry dressing was wrapped about the knee,  wrapped with Ace bandage and thigh support stocking.  The patient was  then awakened, extubated, and taken to the recovery room in stable  condition.      Vania Rea. Supple, M.D.  Electronically Signed     KMS/MEDQ  D:  01/11/2009  T:  01/12/2009  Job:  [id number]

## 2011-02-04 NOTE — Assessment & Plan Note (Signed)
[facility]                             PULMONARY OFFICE NOTE   NAME:Schwark, CHAZE HRUSKA                      MRN:          [id number]  DATE:06/21/2007                            DOB:          27-Jul-1931    Mr. Cisek is a 77 year old Caucasian gentleman who presents for an  evaluation of sleep apnea.  Obstructive sleep apnea was diagnosed by an  overnight polysomnogram in May, 2005, which showed an RDI of 26 events  per hour with desaturation to 89% and 125 minutes of sleep.  CPAP was  titrated to an optimal pressure of +13 cm.  At this level for 72 minutes  he achieved 18.5 minutes of REM sleep and had 4 hypopneas.  He weighed  214 pounds then.  He subsequently used CPAP for the last 3 years.  The  maximum pressure that could be achieved was about 11 cm.  He could not  tolerate the nasal pillows or nasal mask and with full-faced mask he  developed bags under his eyes, extreme dryness of his mouth, runny nose  and hoarseness of his voice.  He was referred to Dr. Georgette Dover for an oral  appliance but due to insurance issues he is not very keen on pursuing  this option.  Surgery is likely not an option for this degree of sleep  apnea but he has seen an ENT surgeon and has been considered not a good  candidate.   He has now stopped using his CPAP for the last 2 months and states that  his tiredness is about the same.  His Epworth Sleepiness Score is 6/24.  His bedtime is again 11 p.m.  He reads himself to bed.  Awakens once or  twice around 3 to 4 a.m.  His wife has not reported snoring.  He gets  out of bed around 7 a.m. and denies fatigue.  He has lost about 16  pounds in the last 3 years.   PAST MEDICAL HISTORY:  1. Ischemic cardiomyopathy with an EF of 30 to 35%.  2. Atrial fibrillation, on anticoagulation.  3. Type 2 diabetes.  4. Prostate cancer.  5. Colon cancer, status post colectomy.  6. Skin cancer.   PAST SURGICAL HISTORY:  1. Colectomy in  2000.  2. Prostatectomy in 1994.  3. Knee surgery in 1993.   No known allergies.   SOCIAL HISTORY:  1. Smoked about 10 packs per day.  Quit smoking in his 58's.  2. Is married.   FAMILY HISTORY:  1. Heart disease in his father.  2. rheumatism paternal grandmother.   REVIEW OF SYSTEMS:  Reports occasional sleepiness when he lays down in  the afternoon and he will fall asleep in church if it is boring.   PHYSICAL EXAM:  Weight 198 pounds, height 6 feet, blood pressure  182/100, heart rate 56 per minute, oxygen saturation 96% on room air.  HEENT:  Normal oropharyngeal space.  NECK:  Circumference 16 inches.  CVS:  S1 S2 normal.  CHEST:  Clear to auscultation.  ABDOMEN:  Soft, nontender.  EXTREMITIES:  No edema.  IMPRESSION:  1. Moderate obstructive sleep apnea with an RDI of 26 per hour.  He      seems to have given all possible options with CPAP a fair trial and      does not want to resume this again.  All kinds of mask interfaces      have been tried.  He is not keen on trying a nasal pillow with a      chin strap again.  The surgical option has been explored and he      does not want to pursue only oral appliance due to insurance      issues.  At this time there is not much data on the non-custom made      appliance and I would not allocate this.  2. He has lost about 16 pounds of weight and it is conceivable that      his sleep apnea has improved.  I have asked him to work towards the      goal of 180 pounds.  3. Will undertake an empiric trial of nasal steroids to see if this      has any benefit.  4. He did have significant periodic leg movements, but these were not      associated with arousal and I do not think that they are clinically      significant.  He certainly does not seem to have restless leg      syndrome.  We will reassess him in about  months' time.     Oretha Milch, MD  Electronically Signed    RVA/MedQ  DD: 06/21/2007  DT: 06/21/2007  Job #:  [id number]   cc:   Valetta Mole. Swords, MD

## 2011-02-04 NOTE — Cardiovascular Report (Signed)
NAMESAXON, BARICH               ACCOUNT NO.:  [id number]   MEDICAL RECORD NO.:  [id number]          PATIENT TYPE:  INP   LOCATION:  4712                         FACILITY:  [facility]   PHYSICIAN:  Arturo Morton. Riley Kill, MD, FACCDATE OF BIRTH:  Sep 04, 1931   DATE OF PROCEDURE:  04/27/2008  DATE OF DISCHARGE:  05/03/2008                            CARDIAC CATHETERIZATION   INDICATIONS:  The patient is well-known to me.  He has exertional angina  and atrial fibrillation.  He also has renal insufficiency.  We elected  not to do ventriculography because of this.  He was well hydrated prior  to the procedure.   PROCEDURE:  Selective coronary arteriography.   DESCRIPTION OF PROCEDURE:  The patient was brought to the Cath Lab and  prepped and draped in usual fashion.  Through an anterior puncture, the  femoral artery was entered.  A 5-French sheath was used.  Standard  Judkins catheters were utilized.  27 mL of contrast was used after good  hydration.  There were no complications.  He was taken to the holding  area in satisfactory clinical condition.   HEMODYNAMIC DATA:  Central aortic pressure is 170/97.   ANGIOGRAPHIC DATA:  1. On plain fluoroscopy, there was moderate calcification of the      coronary vessels.  2. The left main is very short, it consists predominantly of a AV      circumflex and a left coronary artery.  3. The left anterior descending artery courses to the apex.  There is      moderate calcification with proximal irregularity of 20%.  There is      a tiny first diagonal which has luminal irregularity proximally      followed by a second diagonal it has about 60% ostial narrowing in      its takeoff for its bifurcation.  Just distal to this, the LAD has      a focal stenosis of about 80% overlapping the takeoff of the septal      perforator.  The distal LAD has a couple of more diagonal branches      and is without critical narrowing.  4. The circumflex provides  predominantly a tiny first marginal, then a      moderate size second marginal, then an AV circumflex.  There is      modest luminal irregularity but no critical stenosis noted in this      vascular territory.  5. The right coronary artery is a moderately large vessel.  There is      less than 20% to 30% narrowing in the distal portion of the      midvessel with some obvious calcification.  The PDA is large and      without critical disease.  There are two posterolateral branches      also without critical disease.   CONCLUSIONS:  Single-vessel coronary artery disease with a moderate  stenosis of the ostial diagonal and a moderately high-grade stenosis of  the left anterior descending artery.   DISPOSITION:  The patient does have angina.  Because of his renal  insufficiency, we will divide up this procedure, we will likely  recommend a percutaneous intervention.      Arturo Morton. Riley Kill, MD, [facility]  Electronically Signed     TDS/MEDQ  D:  06/29/2008  T:  06/29/2008  Job:  [id number]   cc:   Valetta Mole. Swords, MD  [facility]

## 2011-02-04 NOTE — Letter (Signed)
May 24, 2008    Bruce H. Swords, MD  [address] [place], [place] [address].   RE:  [name], [name]  MRN:  [id number]  /  DOB:  1931/07/09   Dear Smitty Cords,   I had the pleasure of seeing [name] [name] in the office today in a  follow up visit.  It was a pleasure to discuss him with you in detail.  As you know, [name] [name] presented here late in the weak.  He had a more  rapid response to atrial fibrillation, and appeared to be somewhat fluid  overloaded.  As a result, he was started on a daily diuretic,  specifically Lasix 20 mg daily by jake.  He came in for follow up labs  this week, and of note, his BUN was up to 33, creatinine 2.3 and  potassium down to 2.6.  He was given 80 mEq of potassium last night, and  we have switched him to every other day as you and I discussed over the  phone.  He was told to keep an eye on his weight.  His weight is down  about 9 pounds from when he was seen.  His breathing is much better and  he really feels quite good.  He is scheduled to see you on May 30, 2008.   Today on examination, the blood pressure is 140/82, the pulse is 78,  irregularly irregular.  The lung fields are actually quite clear.  There  is 1+ edema involving the lower extremities.   EKG reveals atrial fibrillation.  There is inferior Qs and T-wave  inversion in lateral leads similar to what has happened in the past.   He will start Lasix 20 mg on an every-other-day basis if his weight goes  up or his edema expand he will take that.  He will take 20 mEq with the  potassium as he does this.  We are getting a B-MET today and he is  scheduled to see you on May 30, 2008, at which time all of his labs  can be repeated.  At that point, since the two of you have worked  together so closely in the past on this it makes sense to go back and  decide really where the set point for this should be.  I will be happy  to see him back in follow up at about 6 weeks.  I  appreciate the  opportunity of sharing in his care.    Sincerely,      Arturo Morton. Riley Kill, MD, [facility]  Electronically Signed    TDS/MedQ  DD: 05/24/2008  DT: 05/25/2008  Job #: [id number]

## 2011-02-04 NOTE — Assessment & Plan Note (Signed)
[facility]                            CARDIOLOGY OFFICE NOTE   NAME:[name] [name] HU                      MRN:          [id number]  DATE:05/19/2008                            DOB:          June 01, 1931    PRIMARY CARE PHYSICIAN:  [name] Mole. Swords, MD.   REASON FOR PRESENTATION:  The patient with dyspnea.   HISTORY OF PRESENT ILLNESS:  The patient is a 77 year old gentleman who  just saw Dr. Riley Kill a couple of days ago.  He has a history of coronary  disease.  He has heart failure with mixed systolic and diastolic  dysfunction.  He presented today because he has been having some  increasing dyspnea.  This was really going on, and we did not mention it  to Dr. Riley Kill a couple of days ago.  It has been slowly progressive.  He was dyspneic doing more moderate activity, but now just walking  across the room.  He has some increased shortness of breath, but he is  not describing any PND or orthopnea.  He is not describing palpitation,  presyncope or syncope.  He has had no chest pain.  He has had lower  extremity swelling, which is more than usual though it does go down when  he keeps his feet up.  He does not add salt at the table, but he has  been watching this apparently strictly.  He has only gained 1 pound  according to our scales and he has to weigh himself at home.   PAST MEDICAL HISTORY:  Coronary artery disease (status post non DES  placement to the LAD in August 2009), chronic renal insufficiency,  obstructive sleep apnea with CPAP, permanent atrial fibrillation on  chronic anticoagulation therapy (failed cardioversion), prostate cancer  status post prostatectomy, colon cancer status post colectomy, skin  cancer, hypertension, dyslipidemia, and diabetes mellitus.   ALLERGIES:  Intolerances are none.   MEDICATIONS:  Coumadin, Benicar 40 mg daily, lisinopril 20 mg daily,  simvastatin 40 mg daily, amlodipine 5 mg daily, glyburide 1.25 mg daily,  Plavix 75 mg daily, aspirin 81 mg daily, metoprolol 75 mg b.i.d.   REVIEW OF SYSTEMS:  As stated in the HPI and otherwise negative for  other systems.   PHYSICAL EXAMINATION:  GENERAL:  The patient is in no distress.  VITAL SIGNS:  Blood pressure 166/106, heart rate 75 and regular, weight  215 pounds, body mass index 29.  HEENT:  Eyes unremarkable, pupils equal, round, and reactive to light.  Fundi not visualized.  Oral mucosa normal.  NECK:  No jugular venous distention at 45 degrees.  Carotid upstroke  brisk and symmetrical.  No bruits, thyromegaly.  LYMPHATICS:  No adenopathy.  LUNGS:  Decreased breath sounds without wheezing, crackles, or dullness  to percussion.  BACK:  No costovertebral tenderness.  CHEST:  Unremarkable.  HEART:  PMI not displaced or sustained, S1 and S2 within normal limits.  No S3, no murmurs.  ABDOMEN:  Mildly obese, positive bowel sounds, normal in frequency and  pitch.  No bruits, rebound, guarding.  No midline pulsatile mass.  No  thyromegaly.  No splenomegaly.  SKIN:  No rashes, no nodules.  EXTREMITIES:  2+ upper pulse, 1+ dorsalis pedis and posterior tibialis  bilaterally, moderate bilateral lower extremity edema with chronic  venous stasis changes.  NEUROLOGIC:  Oriented to person, place, and time.  Cranial nerves II  through XII grossly intact.  Motor grossly intact.   EKG atrial fibrillation, left axis deviation, premature ventricular  contraction, poor anterior R wave progression, lateral T-wave  inversions, no significant change from previous.   ASSESSMENT/PLAN:  1. Dyspnea and edema.  The patient does have mixed systolic diastolic      dysfunction apparently.  He also has chronic renal insufficiency      which complicates things.  He is not weighing himself daily.  He      does not strictly adhere to a low-salt diet.  At this point, we      will reviewed these things.  We discussed keeping his feet      elevated.  We should entertain the  idea of compression stockings as      he is uncomfortable with these.  I am going to have him take Lasix      20 mg once a day for the next 3 days starting tomorrow.  He will      then come back on Tuesday for a BMET.  We will then judge how he is      doing symptomatically and whether we should continue this, but the      [name] [name] instruction will be to stop taking the diuretic until he      hears back from [place].  2. Renal insufficiency, as above.  3. Coronary artery disease, having no angina.  No further      cardiovascular testing is suggested.  4. Atrial fibrillation, remains on Coumadin and appears to have      reasonable rate control.     Rollene Rotunda, MD, [facility]  Electronically Signed    JH/MedQ  DD: 05/19/2008  DT: 05/20/2008  Job #: [id number]   cc:   [name] Mole. Swords, MD

## 2011-02-04 NOTE — Discharge Summary (Signed)
[name] [name], [name] [name]               ACCOUNT NO.:  [id number]   MEDICAL RECORD NO.:  [id number]          PATIENT TYPE:  INP   LOCATION:  4705                         FACILITY:  [facility]   PHYSICIAN:  Rollene Rotunda, MD, FACCDATE OF BIRTH:  04-24-1931   DATE OF ADMISSION:  04/04/2009  DATE OF DISCHARGE:  04/08/2009                               DISCHARGE SUMMARY   PRIMARY CARDIOLOGIST:  Maisie Fus D. Riley Kill, MD, [facility]   PRIMARY CARE PHYSICIAN:  Valetta Mole. Swords, MD   PROCEDURES PERFORMED DURING HOSPITALIZATION:  Echocardiogram.  This was  completed on April 04, 2009 revealing severe hypokinesis of the septum,EF  30% with mild LVH, mildly elevated pulmonary arterial pressure with a  peak pressure of 46 mmHg.   FINAL DISCHARGE DIAGNOSES:  1. Acute on chronic nonischemic congestive heart failure, ejection      fraction 30% per echo.  2. Diabetes.  3. Hypertension.  4. Chronic atrial fibrillation.  5. Dyslipidemia.  6. History of prostate cancer status post prostatectomy.  7. Osteoarthritis status post knee surgery on April 2010.  8. Coronary artery disease.      a.     Status post catheterization August 2009 with 80% stenosis in       the distal left anterior descending, bare-metal stent was placed.   HOSPITAL COURSE:  This is a 77 year old Caucasian male well-known to Dr.  Shawnie Pons who presented to the hospital after having increased  shortness of breath which became worse around 10 p.m. in the night of  admission.  The patient tried to use the CPAP but he felt like he was  suffocating.  He presented to the emergency room and was treated with  Lasix IV 80 mg and sublingual nitroglycerin.  Chest x-ray showed  pulmonary edema.  The patient was admitted for diuresis and an  echocardiogram was completed.   The patient's echocardiogram revealed an EF of 30% which was decreased  from 45% per last echo completed approximately 1 year prior.  The  patient was continued on his Coumadin for  his known history of atrial  fibrillation chronically.  His blood pressure was not well controlled  and therefore his hydralazine was increased to 25 mg t.i.d.  Secondary  to low EF, the patient's Cardizem was discontinued.  He was started on  digoxin 0.25 mg p.o. x2 and then 0.125 mg daily.  His metoprolol was  increased from 25 mg b.i.d. to 50 mg b.i.d.   The patient did diurese during hospitalization and his weight came down  2 kg.  The patient did have some mild evidence of hypokalemia which was  repleted during hospitalization.  The patient's followup chest x-ray  revealed decreased pulmonary edema but stable tiny bilateral pleural  effusions were noted.  The patient was anxious to return home and had no  further complaints of shortness of breath.  On day of discharge, the  patient was seen by myself and Dr. Rollene Rotunda.  On discharge, his  digoxin was discontinued secondary to chronic renal insufficiency.  His  Toprol was changed to 100 mg XL and his hydralazine remained  at 25 mg  t.i.d.  The patient will follow up with Dr. Shawnie Pons with a BMET  prior to the office appointment and will discuss further rate control if  necessary off the digoxin.  Dr. Antoine Poche recommends that it may be given  every other day at Dr. Rosalyn Charters discretion, however, this will be  decided upon on followup appointment.   DISCHARGE LABORATORY DATA:  Sodium 138, potassium 3.6, chloride 103, CO2  28, BUN 34, creatinine 2.3, glucose 143, PT 22.8, INR 1.9.  BNP 1052.  Vital Signs: Blood pressure 142/84, heart rate 70, respirations 18, O2  sat 97% on room air, temperature 97.6, weight on discharge 85.1 kg.  EKG  on discharge revealing atrial fibrillation with a rate of 87 beats per  minute with PVCs noted and T-wave inversion noted in leads 5 and 6.   DISCHARGE MEDICATIONS:  1. Coumadin 5 mg Monday, Wednesday, Friday and 2.5 mg on other days.  2. Hydralazine 25 mg three times a day (new higher  dose).  3. Enteric-coated aspirin 81 mg daily.  4. Prilosec 20 mg daily.  5. Senna tablet daily.  6. Darvocet-N 100 1-2 tablets p.r.n. pain.  7. Toprol-XL 100 mg daily (new prescription provided).  8. K-Dur 40 mEq daily.  9. The patient has been advised to stop taking diltiazem and that his      metoprolol dose has been changed.  10.Lasix 80 mg 1 p.o. daily.   FOLLOWUP PLANS AND APPOINTMENT:  1. The patient will follow up in our office with Dr. Shawnie Pons in      2-3 weeks.  Our office will call as this is for followup      appointment.  2. The patient will have a BMET drawn 1 week prior to seeing Dr.      Riley Kill.  Our office will call him for that appointment.  3. The patient should follow with Dr. Birdie Sons for continued      medical management and diabetic management.  4. The patient has been reminded of new medications, prescriptions      provided and need to stop taking diltiazem.  5. The patient has been advised to weigh himself daily.  If he gains 2-      3 pounds over 48 hours, he is to call our office for further      advisement.   Time spent with the patient to include physician time 45 minutes.      Bettey Mare. Lyman Bishop, NP      Rollene Rotunda, MD, [facility]  Electronically Signed    KML/MEDQ  D:  04/08/2009  T:  04/09/2009  Job:  [id number]   cc:   Valetta Mole. Swords, MD

## 2011-02-04 NOTE — Assessment & Plan Note (Signed)
[name] [name]                            CARDIOLOGY OFFICE NOTE   NAME:[name], [name] HEATHMAN                      MRN:          [id number]  DATE:06/29/2008                            DOB:          07-23-1931    This is dictated as a repeat dictation.  There was an error message  noted on the dictation that was done at the time of his visit.  Therefore, this is a repeat dictation.   The patient is in for a 6-week followup visit.  In general, he has  really been getting along well, and he has been following up with Bruce.  Blood pressures have been checked.  His basic metabolic profile has been  followed.  His last creatinine in this office was 2.1 with a potassium  of 3.2, but he has been followed by Bruce pretty carefully and they have  an excellent working relationship.   Medications include:  1. Amlodipine 5 mg daily.  2. Potassium 20 mEq daily.  3. Lopressor 50 mg in the morning and 25 in the evening.  4. Lisinopril 40 mg daily.  5. Lasix 20 mg daily.  6. Spironolactone 25 mg daily.  7. Coumadin 2.5 mg as directed.  8. Benicar 40 mg daily.  9. Simvastatin 40 mg daily.  10.Glyburide 1.25 daily.  11.Enteric-coated aspirin.   On physical, the blood pressure is 126/78, the pulse is 72.  The lung  fields are clear.  His cardiac rhythm is regular.   IMPRESSION:  1. Status post percutaneous stenting of the left anterior descending      artery with a non-drug-eluting platform status post cardiac      catheterization with high-grade left anterior descending disease,      80% stenosis.  2. Persistent atrial fibrillation, on Coumadin anticoagulation.  3. Hypertension, difficult to control.  4. Chronic renal insufficiency.   PLAN:  The patient and I had a long discussion about this today.  He  will continue to follow up with Dr. Cato Mulligan very closely as his  medication regimen is really quite complicated and needs continuous  supervision.     Arturo Morton. Riley Kill, MD, [name]  Electronically Signed    TDS/MedQ  DD: 07/28/2008  DT: 07/28/2008  Job #: [id number]   cc:   Valetta Mole. Swords, MD

## 2011-02-04 NOTE — Discharge Summary (Signed)
NAMEKREE, RAFTER               ACCOUNT NO.:  [id number]   MEDICAL RECORD NO.:  [id number]          PATIENT TYPE:  INP   LOCATION:  2040                         FACILITY:  [facility]   PHYSICIAN:  Arturo Morton. Riley Kill, MD, FACCDATE OF BIRTH:  03/22/1931   DATE OF ADMISSION:  04/10/2008  DATE OF DISCHARGE:  04/14/2008                               DISCHARGE SUMMARY   PRIMARY CARDIOLOGIST:  Maisie Fus D. Riley Kill, MD, [facility]   PRIMARY CARE PHYSICIAN:  Valetta Mole. Swords, MD   CONSULTING PHYSICIAN:  Renal physician, signature unable to read.   PROCEDURES PERFORMED DURING HOSPITALIZATION:  1. Echocardiogram.      a.     Revealed left ventricular ejection fraction of 40-45% with       hypokinesis of the inferior wall.  2. Renal ultrasound revealing multiple bilateral renal cysts and      unusual renal size similar to results in 2006.   FINAL DISCHARGE DIAGNOSES:  1. Chest pain.      a.     Known coronary artery disease, catheterization in 2002       revealing multiple areas of luminal irregularities with mild       coronary artery disease.  2. Atrial fibrillation.  3. Hypertension.  4. Renal insufficiency.  5. Chronic anticoagulation therapy.  6. History of obstructive sleep apnea with continuous positive airway      pressure.  7. Colon cancer, status post colectomy.  8. Dyslipidemia.  9. Diabetes.   HOSPITAL COURSE:  This is a 77 year old Caucasian male, who is well  known to Dr. Riley Kill, who presented to [facility] Emergency Room on the  day of admission secondary to chest discomfort along with 2 weeks of  left arm discomfort and pain, specifically under the left axillary and  nuchal area.  He states that he is aware that something was not right,  it was not associated with activity or position.  The patient remains  very active and continues to work at [employer].  The patient was  admitted to rule out myocardial infarction secondary to known history of  coronary artery disease.  During  hospitalization, it was noted that the  patient's renal status was abnormal with a creatinine of 2.3.  The  patient had renal consult obtained and renal ultrasound completed per  Dr. Riley Kill as a result of this.  In the interim, the patient's  amlodipine was decreased from 10 mg a day to 5 mg a day.  Renal saw the  patient and recommended stopping NSAIDs.  He was also asked to hold  lisinopril and Benicar as cardiac catheterization was to be completed  with gentle hydration and Mucomyst prior to procedure along with  hydration after procedure.  Lasix was placed on hold as well along with  digoxin.  The patient's heart rate and blood pressure remained stable.  His Benicar was reinstituted on the day of discharge.  At that time, his  creatinine was 2.0.  The patient's Lasix was continued to stay on hold.  His digoxin was cut in half to 0.625 mg daily for better rate control  of  atrial fibrillation.  His Cardizem was also decreased to 240 mg daily.   The patient had lengthy discussion with Dr. Shawnie Pons concerning  cardiac catheterization and it was requested by the patient that the  cardiac catheterization wait at this time and he would follow up with  early office visit with Dr. Riley Kill to discuss need for repeat cardiac  catheterization.  The patient is currently asymptomatic, pain-free, and  is anxious to return home.  Dr. Riley Kill did agree to allow the patient  to go home with medication adjustments, decrease in lisinopril dose,  digoxin, and Cardizem dose.  He will no longer be taking Lasix or  potassium replacements.  On followup appointment with Dr. Riley Kill, he  will also have a BMET drawn.   DISCHARGE LABORATORY DATA:  Sodium 142, potassium 3.5, chloride 106, CO2  of 25, BUN 30, creatinine 2.0, glucose 148, PT 23.0, and INR 1.9.  Hemoglobin 13.8, hematocrit 39.7, and platelets 128.   DISCHARGE VITAL SIGNS:  Blood pressure 168/91, heart rate 66,  respirations 20, temperature  97.1, and O2 sat 92% on room air.   DISCHARGE MEDICATIONS:  1. Coumadin 5 mg on Monday, Wednesday, and Friday and 2.5 on Tuesday,      Thursday, and Saturday.  2. Benicar 40 mg daily.  3. Diltiazem CD 240 mg daily (new lower dose).  4. Digitek was 0.125 mg 1/2 daily.  5. Lisinopril 20 mg 1 daily (new lower dose).  6. Simvastatin 40 mg daily.  7. Amlodipine 5 mg daily (new medication).  8. Glyburide daily.   The patient has been advised to discontinue JANUVIA, FUROSEMIDE, and  POTASSIUM at home as he was taking prior to admission.   FOLLOWUP PLANS AND APPOINTMENTS:  1. The patient is to follow up with Dr. Shawnie Pons on Tuesday,      April 18, 2008, at 10 a.m.  At that time, the patient will have a      BMET drawn.  2. The patient is to follow up with Dr. Birdie Sons, primary care      physician, on his own accord.  He is to make that appointment.  3. The patient is to follow up with Nephrology after being seen by Dr.      Riley Kill for continued management of renal insufficiency.  4. The patient has been advised on new medications and changes to      include holding furosemide, potassium, and Januvia as he was taking      at home.   Time spent with the patient to include physician time 45 minutes.     Bettey Mare. Lyman Bishop, NP      Arturo Morton. Riley Kill, MD, [facility]  Electronically Signed   KML/MEDQ  D:  04/14/2008  T:  04/15/2008  Job:  [id number]   cc:   Valetta Mole. Swords, MD

## 2011-02-04 NOTE — Letter (Signed)
May 17, 2008    Bruce H. Swords, MD  [address] [place], [place] [address].   RE:  [name] [name], [name] [name]  MRN:  [id number]  /  DOB:  1931/06/06   Dear Smitty Cords,   I had the pleasure of seeing Grayton Lobo in the office today in the  followup.  In general, he is doing reasonably well.  His major complaint  is swelling in the feet.  His blood pressures have been little bit  higher.  His heart rate has also been higher since Cardizem has been  discontinued.  Importantly, he had labs in your office with a BUN of 31,  creatinine 2.0, and potassium 3.6.   Today the medications include:  1. Coumadin.  2. Benicar 40 mg daily.  3. Digoxin.  4. Lisinopril 20 mg 1 tablet daily.  5. Simvastatin 40 mg daily.  6. Amlodipine 5 mg daily.  7. Glyburide 1.25 mg daily.  8. Plavix 75 mg daily.  9. Enteric-coated aspirin 81 mg daily.  10.Lopressor 50 mg p.o. b.i.d.   PHYSICAL EXAMINATION:  GENERAL:  He is alert and oriented and in good  spirits.  VITAL SIGNS:  His weight is 214 pounds.  Blood pressure 170/100, pulse  is 96, and irregularly irregular.  LUNG:  Fields are clear.  His groin site actually looked pretty good.  He has had some mild bilateral lower extremity edema.   His EKG reveals atrial fibrillation with slightly fast ventricular  response, nonspecific ST-T abnormalities.  He has inferior Qs as  previously noted.  His inferolateral T-wave inversion, which was rather  profound is markedly improved.   The patient appears to be stable at the present time.  His heart rate is  slightly fast.  We will increase his metoprolol to 75 b.i.d.  In  addition, my hope is that he will continue to improve.  Because of the  swelling in the feet, I think it is best to cut back on his amlodipine  at 2.5 mg, if his blood pressure is controlled on the higher dose of  metoprolol and at that point, go from there.  He will see you back in  follow up on May 30, 2008.  I have also told him that  he only needs  to take Plavix for 30 days.  This is the general recommendation for non  drug-eluting stent.   I appreciate the opportunity of sharing in this nice patient's care.  Please let me know.  We will see him back in followup in 6 weeks and I  will send you note at that time.    Sincerely,      Arturo Morton. Riley Kill, MD, [facility]  Electronically Signed    TDS/MedQ  DD: 05/17/2008  DT: 05/18/2008  Job #: [id number]

## 2011-02-04 NOTE — H&P (Signed)
[name] [name], [name] [name]               ACCOUNT NO.:  [id number]   MEDICAL RECORD NO.:  [id number]          PATIENT TYPE:  OBV   LOCATION:  2040                         FACILITY:  [facility]   PHYSICIAN:  Bevelyn Buckles. Bensimhon, MDDATE OF BIRTH:  10-23-1930   DATE OF ADMISSION:  04/10/2008  DATE OF DISCHARGE:                              HISTORY & PHYSICAL   PRIMARY CARDIOLOGIST:  Arturo Morton. Riley Kill, MD, [facility]   PRIMARY CARE:  Valetta Mole. Swords, MD   [name] is a very pleasant 77 year old Caucasian gentleman who  presents to [facility] Room today complaining of chest  discomfort.  He had a cardiac catheterization done back in 2002, by Dr.  Shawnie Pons, that showed recurrent atrial fibrillation; left  ventricular dysfunction, possibly on the basis of poor rate control;  coronary calcification; and multiple areas of luminal irregularities  with mild coronary artery disease.  He was found to have an EF  calculated at 36%.  Catheterization was done in the setting of abnormal  echocardiogram and a positive Cardiolite.  [name] [name] complains of 2  weeks of left arm discomfort and pain under the left axillary and left  nipple area, very difficult to describe the discomfort.  He states, he  is just aware that something is not right and that his left arm feels  differently from the right arm.  A couple times, he has had some  numbness in his jaw.  It is not associated with any activity or  position.  Mr. 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  He states he  enjoys this work and the chest discomfort does not correlate with his  work.  This morning, he woke up.  He states, he felt fine.  He noticed  the pain in his left arm after getting up and moving around, just a  vague discomfort around the left nipple; described it sometimes as a  burning sensation.  He went on to work at the Daily, had no change in  his symptoms.   He also describes the headache for 2 days, which is rare  for him.  He states, he never has a headache.  Currently, having some  burning around his left nipple that is not reproducible to palpation.   PAST MEDICAL HISTORY:  1. Obstructive sleep apnea with CPAP.  2. Atrial fib.  3. Chronic anticoagulation therapy.  4. The patient reports previous cardioversion unsuccessful.  5. Prostate cancer, status post prostatectomy.  6. Colon cancer, status post colectomy.  7. Skin cancer.  8. Hypertension.  9. Dyslipidemia.  10.Diabetes.   Last hemoglobin A1c 7.9.   The patient also states intolerance to questionable beta blocker  therapy.  States, it made him so tired and fatigue, he could not stand  it.   SOCIAL HISTORY:  He lives here in [place] with his wife.  He is  retired, but continues to work 12-20 hours a week as stated above.  Quit  smoking tobacco approximately 15 years ago.  Tries to follow a  heart-  healthy ADA diet.   FAMILY HISTORY:  Noncontributory.   REVIEW OF SYSTEMS:  Positive for headache x2 days, chest pain, dyspnea  on exertion, some lower extremity edema after standing on his feet all  day, and left arm discomfort as described above.   ALLERGIES:  No known drug allergies, but questionable intolerance to  beta blockers.   CURRENT MEDICATIONS:  1. Diltiazem 360 mg.  2. Digoxin 0.125 mg.  3. Warfarin.  4. Potassium chloride 20 mEq b.i.d.  5. Lisinopril 40 mg.  6. Benicar 20 mg.  7. Simvastatin 40 mg.  8. Januvia 50 mg.  9. Furosemide 40 mg.   PHYSICAL EXAMINATION:  VITAL SIGNS:  Temp 98.9, heart rate 47,  respirations 16, and blood pressure 175/89.  GENERAL:  He is in no acute distress.  Normocephalic and atraumatic.  Pupils equal, round, and reactive to light.  Mucous membranes moist.  NECK:  Supple without lymphadenopathy.  No bruits.  No JVD.  CARDIOVASCULAR:  S1 and S2, brady rhythm with frequent early beats.  LUNGS:  Clear to auscultation  bilaterally.  SKIN:  Warm and dry.  ABDOMEN:  Soft and nontender.  Positive bowel sounds.  LOWER EXTREMITIES:  Without clubbing or cyanosis.  He has a trace of  edema.  NEUROLOGICAL:  Alert and oriented x3.   LABORATORY DATA:  Chest x-ray, results are pending.  EKG sinus rhythm at  a rate of 70 with frequent PVCs with subtle T-wave inversions noted in  inferolateral leads, unchanged from previous EKG done in 2003.  Lab work  is pending at this time.   IMPRESSION:  1. Chest pain associated with left arm discomfort and per etiology,      the patient with known coronary calcification with left ventricular      dysfunction with ejection fraction of 33% by catheterization in      2003, also history of atrial fibrillation requiring chronic      anticoagulation therapy.  2. Hypertension.  Obstructive sleep apnea with compliance to      continuous positive airway pressure, now with sinus brady with      medications on board.  The plan is to admit the patient for      observation and cycle enzymes if negative.   PLAN:  Outpatient stress test and echocardiogram.  Check a TSH and check  a digoxin level.  We will hold digoxin and Cardizem at this time and  actually go ahead and switch the patient over to Norvasc 10 mg daily for  blood pressure control.  We will not anticoagulate.  Continue home  Coumadin and add low-dose aspirin.  Dr. Arvilla Meres has been into  examine and assess.  The patient agrees with plan of care.      Dorian Pod, ACNP      Bevelyn Buckles. Bensimhon, MD  Electronically Signed    MB/MEDQ  D:  04/10/2008  T:  04/11/2008  Job:  [id number]

## 2011-02-04 NOTE — Discharge Summary (Signed)
NAMEBRAINARD, [name] [name]               ACCOUNT NO.:  [id number]   MEDICAL RECORD NO.:  [id number]          PATIENT TYPE:  INP   LOCATION:  4712                         FACILITY:  [facility]   PHYSICIAN:  [name] Morton. Riley Kill, MD, FACCDATE OF BIRTH:  1930-10-10   DATE OF ADMISSION:  04/25/2008  DATE OF DISCHARGE:  05/03/2008                         DISCHARGE SUMMARY - REFERRING   DISCHARGE DIAGNOSES:  1. Acute coronary syndrome with EKG changes.  2. Progressive coronary artery disease.  3. Status post bare metal stenting to the LAD.  4. Hypertension.  5. Permanent atrial fibrillation with a subtherapeutic INR on      admission of 1.8.  6. Hypokalemia.  7. Hyperglycemia with a history of diabetes.  Last hemoglobin A1c      available to [place] in the hospital shows on April 2008 a hemoglobin      A1c of 8.7.  Supposedly history of beta blocker intolerance.  8. History as noted per past medical history.   PROCEDURES PERFORMED:  1. Cardiac catheterization on April 27, 2008, by Dr. Riley [name].  2. Status post non-drug-eluting stent on May 01, 2008, by Dr.      Riley [name].   BRIEF HISTORY:  [name] [name] is a 77 year old white male who was seen in  the office on August 4 by Dr. Riley [name] and EKG revealed no EKG changes.  Given his history, Dr. Riley [name] admitted him for cardiac catheterization  and further evaluation.   PAST MEDICAL HISTORY:  1. Obstructive sleep apnea with CPAP.  2. Permanent atrial fibrillation with anticoagulation managed by his      primary care physician.  3. Prior failed cardioversion.  4. Prostate cancer status post prostatectomy.  5. Colon cancer status post colectomy.  6. History of skin cancer.  7. Hypertension.  8. Dyslipidemia.  9. Diabetes.  10.Remote tobacco use.   LABORATORY:  Chest x-ray on August 5 did not show any acute findings.  Weight on August 5 was 94.8 kg.  Admission H&H was 14.6 at 42.5 with  normal indices, platelets 170, WBCs 8.1.  Admission PTT was 48, PT  22.2  with subtherapeutic INR 1.8.  Sodium was 141, potassium 3.5, BUN 27,  creatinine 1.98, glucose 124, normal LFTs.  CK MBs relative index and  troponins were within normal limits times three.  Post procedure CK MBs  relative indexes were within normal limits.  Troponins were slightly  elevated at 0.07 and point 0.08.  Prior to discharge H&H was 13.2 and  38.0, normal indices, platelets 140, WBCs 6.4, PT 14.3.  INR 1.1, sodium  139, potassium 3.1.  BUN 22, creatinine 1.84, glucose 116.  EKGs on  August 11 shows atrial fibrillation with couplet, left axis deviation,  lateral T-wave inversion.   HOSPITAL COURSE:  The patient was admitted to [facility] for  further evaluation of his EKG changes and chest discomfort.  Dr.  Riley [name], noting that his heart rate was bradycardic in the office,  discontinued his Digitek and diltiazem.  He was placed on IV heparin and  he ruled out for myocardial infarction.  By August 6, he had not  had any  further chest discomfort, and plans were made for a cardiac  catheterization.  Progression nurse assisted with potential needs and  the diabetes treatment program assisted with diabetes management.  Catheterization was performed on august 6 by Dr. Riley [name].  He noted  primarily an 80% focal LAD lesion and felt that he should probably have  a PCI.  Over the weekend, his potassium was supplemented.  His BUN and  creatinine did slightly rise post catheterization to a peak BUN of 27  and creatinine 1.9.  However, this was improved by Monday.  It was noted  over the weekend he had episodes of chest discomfort that were relieved  with nitroglycerin.  It was also noted when he had these episodes of  chest discomfort he was tachycardiac.  On May 01, 2008, he underwent  non-drug-eluting stent to the LAD without difficulty.  Dr. Riley [name] also  noticed 70-80% diagonal that have not changed.  Cardiac rehab assisted  with ambulation and education and by August  12, he was ambulating  without difficulty.  Catheterization site post PCI on the left did show  some slight ecchymosis medial and superior to the site.  However, it was  felt to be stable.  Potassium was noted to be low.  Thus, he received  potassium supplementation prior to discharge.   DISPOSITION:  The patient is discharged home, asked to maintain low-  sodium heart-healthy ADA diet.  Wound care and activities are per  supplemental sheet.  His Coumadin was decreased to 2.5 mg daily to  achieve a target INR of 1.8-2.2 while he was on Coumadin, aspirin and  Plavix.  His Coumadin dose may be increased when his Plavix was  discontinued.  New medications also include Plavix 75 mg daily,  nitroglycerin 0.4 as needed, aspirin 81 mg daily and Lopressor 50 mg  b.i.d. he was asked to continue simvastatin 40 mg q.h.s. Benicar 40 mg  daily, lisinopril 20 mg daily, Norvasc 5 mg daily, glyburide 1.25 mg  daily.  He was asked to discontinue his diltiazem, furosemide and  Digitek.  He is asked to call Dr. Cato [name] office to arrange a PT/INR and  an appointment with Dr. Cato [name] for next week.  Again target INR is  between 1.8 and 2.2 until his Plavix was discontinued.  Then target INR  should be between 2.0 at 3.0.  He was asked to bring all medications to  all appointments.  He will follow up with Dr. Riley [name] in approximately 2  weeks and will receive this appointment prior to his leaving the  hospital.  He was also asked to take 40 mEq of potassium this afternoon  at home, but not to continue potassium on a regular basis.   TIME OF DISCHARGE:  Fifty minutes.      [name] Rued, PA-C      [name] Morton. Riley Kill, MD, [facility]  Electronically Signed    EW/MEDQ  D:  05/03/2008  T:  05/03/2008  Job:  [id number]   cc:   [name] Mole. Swords, MD  [name] Morton. Riley Kill, MD, [facility]

## 2011-02-04 NOTE — Assessment & Plan Note (Signed)
[facility]                            CARDIOLOGY OFFICE NOTE   NAME:Thrush, ALIN CHAVIRA                      MRN:          [id number]  DATE:04/18/2008                            DOB:          29-May-1931    Mr. Buttery is in for followup visit.  He was recently discharged from  the hospital.  He last underwent catheterization by me in 2002.  At that  time, he had calcification in the right coronary artery without critical  narrowing.  There was a separate ostium through the circumflex with 40-  50% narrowing and some scattered plaque in the LAD, but that was not  significant.  He had hypokinesis at that time with an ejection fraction  of 33%.  He was in atrial fibrillation at that time.  He has been  followed by Dr. Cato Mulligan since that time and I actually had not seen him  back.  He was recently admitted to the hospital during which he had an  episode of chest pain.  He did not have significant enzyme elevation.  What he did have was a significant elevation in creatinine with a  creatinine of 2.4.  We made some adjustments in his medications and his  clinical symptoms improved; as a result, we elected to recommend medical  therapy at least for the initial time and withhold his furosemide.  I  also spoke with Dr. Birdie Sons, and his creatinine previously was  about 1.9 perhaps with resumption of his findings, his creatinine back  into the mid 2 range.  We have talked about a cardiac catheterization  while he was in the hospital.  The patient felt that we should wait on  catheterization and I discussed all these options with him in some  detail.  He was painfree and wanted to go home.  We have scheduled him  back for followup here on April 18, 2008, today.  Since discharge from  the hospital, he has had just in very rare occasional episode of chest  discomfort.  It has not been frequent.   His medications currently include,  1. Coumadin.  2. Benicar 40 mg  daily.  3. Digitek 0.125 mg half tablet daily.  4. Lisinopril 20 mg daily.  5. Simvastatin.  6. Amlodipine 5 mg daily.  7. Glyburide 1.25 mg daily.   Examination today, the blood pressure was 184/100 and pulse was 90 to  100.  The lungs were clear.  There was trace edema.  The cardiac exam  was irregularly irregular.   Since discharge from the hospital, the patient has been stable.  However, his rate is somewhat fast.  We talked about the possibility of  putting him back on a lower dose of diltiazem, and one option would be  to give him every to other day therapy to avoid the need for a new  prescription which he would like to not have to get.  I have told him  that I think we should lean towards consideration of cardiac  catheterization, we plan to see him back in the next Tuesday and we will  discontinue his Coumadin at this time.  At that time, he will be more  off his anticoagulation.  We will hopefully be on a position where we can go ahead with cardiac  catheterization.  The patient is agreeable with this approach.  He will  call us if there is any change in his status.     Arturo Morton. Riley Kill, MD, [facility]  Electronically Signed    TDS/MedQ  DD: 04/25/2008  DT: 04/26/2008  Job #: [id number]

## 2011-02-04 NOTE — Assessment & Plan Note (Signed)
[facility]                            CARDIOLOGY OFFICE NOTE   NAME:[name] [name], [name] [name]                      MRN:          [id number]  DATE:11/23/2008                            DOB:          [birth year]    [name] [name] comes in today for a followup visit.  He had had some mild  chest discomfort off, but his bigger complaint is that of some shortness  of breath with limited activity, and also perhaps more importantly, the  fact that he has had recent increasing weight gain of nearly 30 pounds.  He does not know the reasons for this.  He also would like to have a  knee operation with Dr. Francena Hanly.  The patient last underwent  catheterization in August 2009, and he and I reviewed those films.  To  briefly summarize, he did not have critical disease of the circumflex or  RCA system.  He had a progressive lesion of the mid left anterior  descending artery and fairly significant lesion involving the diagonal.  We stented the LAD with a non drug-eluting stent because of his need for  persistent anticoagulation.  He also has an elevated creatinine, and he  has an elevated blood pressure, which Dr. Cato Mulligan have been managing  carefully.  The patient has not had prolonged rest pain or any other  major difficulty.   His medications include:  1. Amlodipine 5 mg daily.  2. Lopressor 50 mg in the morning and 25 mg in the evening.  3. Hydralazine 25 mg nightly.  4. Furosemide 80 mg daily.  5. Potassium 40 mEq daily.   PHYSICAL EXAMINATION:  VITAL SIGNS:  Blood pressure was 126/80, pulse  was 82, weight was 221.  LUNGS:  Fields were generally clear.  CARDIAC:  Rhythm was irregularly irregular without significant murmur  noted.  EXTREMITIES:  He does have 1-2+ edema involving the lower extremities.   He and I had an extensive discussion about the findings today.  He does  think he could walk for an exercise tolerance test.  We would be able to  get his heart  rate high enough.  He is in atrial fib.  We have discussed  the possible etiologies of his lower extremity swelling.  As a result, I  elected to recommend discontinuation of amlodipine, increase of  hydralazine to 25 p.o. b.i.d., and increasing his metoprolol to 50 mg  p.o. b.i.d..  We will need to keep an eye on his heart rate, but is not  significantly slow at the present time, nor is it inordinately  controlled.   We will arrange for radionuclide imaging study next week.  Hopefully,  that will give [place] an eye into left anterior descending territory as  specifically, I would like to avoid a repeat catheterization given his  marked elevated creatinine.  I will discuss his case with Dr.  Cato Mulligan.     [name] Morton. Riley Kill, MD, [facility]  Electronically Signed    TDS/MedQ  DD: 12/01/2008  DT: 12/01/2008  Job #: [id number]   cc:   Valetta Mole. Swords, MD  Vania Rea. Supple, M.D.

## 2011-02-07 NOTE — Cardiovascular Report (Signed)
[facility]  Patient:    [name] [name], [name] [name]                      MRN: [id number] Adm. Date:  [id number] Attending:  Ronaldo Miyamoto CC:         Valetta Mole Swords, M.D. [facility]  Cath lab   Cardiac Catheterization  INDICATIONS:  Mr. Batty is a 77 year old who has had recurrent episodes of atrial fibrillation.  He has had an abnormal echocardiogram, and also a positive Cardiolite.  He has been on Coumadin.  He has had prior cardioversion. The current study was done to assess coronary anatomy.  PROCEDURE: 1. Left heart catheterization. 2. Selective coronary arteriography. 3. Selective left ventriculography.  DESCRIPTION OF PROCEDURE:  Following informed consent, the patient was brought to the catheterization lab and prepped and draped in the usual fashion.  1% Xylocaine was used for local anesthesia.  Through an anterior puncture, the right femoral artery was easily entered.  A 6 French sheath was then placed. Views of the left and right coronary arteries were obtained in multiple angiographic projections.  Ventriculography was performed in the RAO projection.  He tolerated the procedure well and there were no complications.  HEMODYNAMIC DATA:  Aortic pressure 150/100, left ventricular pressure 158/15, no gradient on pullback across the aortic valve.  ANGIOGRAPHIC DATA:  Left ventriculography was performed in the RAO projection. There was global hypokinesis.  There did not appear to be significant mitral regurgitation.  Ejection fraction was calculated at 33%.  It is important to understand that the patient was in atrial fibrillation at the time of the study.  There was generalized calcification of the coronary arteries involving both the LAD and the right coronaries.  The left main was very short and was without significant focal obstruction.  The left anterior descending artery coursed to the apex.  There was calcification in the LAD as  noted.  There was about 30% midnarrowing covering the origin of a septal perforator.  Just prior to this were two diagonal branches.  The first diagonal branch was relatively small and had mild eccentric narrowing that would be graded at 40%.  The second diagonal was small to moderate and had a focal stenosis at the ostium of about 50%.  The mid and distal LAD was without critical narrowing.  The circumflex ostium had about 40% narrowing.  The remainder of the circumflex had minimal luminal irregularity, but no high grade focal stenoses.  The right coronary artery was calcified in the midvessel.  However, it was a very large caliber vessel being in excess of 4 mm and without significant focal obstruction.  CONCLUSION: 1. Recurrent atrial fibrillation. 2. Left ventricular dysfunction possibly on the basis on poor rate control. 3. Coronary calcification and multiple areas of luminal irregularity with    mild coronary artery disease as noted above.  DISPOSITION: 1. Follow up with Dr. Cato Mulligan. 2. Decision made regarding recurrent cardioversion.  Of note, the recent Affirm Trial data suggests the possibility of rate control as an acceptable strategy and this may be considered with somewhat improved rate control as well as chronic Coumadin anticoagulation.  This may well be an acceptable alternative in the longterm management strategy for this patient. He has had previous cardioversion.  Risk factor modification based on the patients diabetes will be important for preventing progression of coronary artery disease.  I have reviewed the films with the patients wife and also with the patient  in the laboratory and discussed them with Dr. Cato Mulligan. DD:  02/19/01 TD:  02/19/01 Job: [id number]

## 2011-02-10 ENCOUNTER — Ambulatory Visit (INDEPENDENT_AMBULATORY_CARE_PROVIDER_SITE_OTHER): Payer: Medicare Other | Admitting: *Deleted

## 2011-02-10 DIAGNOSIS — I4891 Unspecified atrial fibrillation: Secondary | ICD-10-CM

## 2011-02-10 DIAGNOSIS — I509 Heart failure, unspecified: Secondary | ICD-10-CM

## 2011-02-10 DIAGNOSIS — I428 Other cardiomyopathies: Secondary | ICD-10-CM

## 2011-02-11 ENCOUNTER — Other Ambulatory Visit (INDEPENDENT_AMBULATORY_CARE_PROVIDER_SITE_OTHER): Payer: Medicare Other | Admitting: Internal Medicine

## 2011-02-11 DIAGNOSIS — I10 Essential (primary) hypertension: Secondary | ICD-10-CM

## 2011-02-11 DIAGNOSIS — I4891 Unspecified atrial fibrillation: Secondary | ICD-10-CM

## 2011-02-11 LAB — BASIC METABOLIC PANEL
BUN: 63 mg/dL — ABNORMAL HIGH (ref 6–23)
CO2: 24 mEq/L (ref 19–32)
Calcium: 9 mg/dL (ref 8.4–10.5)
GFR: 21.7 mL/min — ABNORMAL LOW (ref >60.00)
Glucose, Bld: 187 mg/dL — ABNORMAL HIGH (ref 70–99)

## 2011-02-13 ENCOUNTER — Encounter: Payer: Medicare Other | Admitting: *Deleted

## 2011-02-21 ENCOUNTER — Ambulatory Visit: Payer: Medicare Other

## 2011-03-11 ENCOUNTER — Ambulatory Visit: Payer: Medicare Other

## 2011-03-11 DIAGNOSIS — I4891 Unspecified atrial fibrillation: Secondary | ICD-10-CM

## 2011-03-11 NOTE — Patient Instructions (Signed)
Same dose 

## 2011-04-01 ENCOUNTER — Encounter: Payer: Self-pay | Admitting: Cardiology

## 2011-04-01 ENCOUNTER — Telehealth: Payer: Self-pay | Admitting: Cardiology

## 2011-04-01 ENCOUNTER — Ambulatory Visit (INDEPENDENT_AMBULATORY_CARE_PROVIDER_SITE_OTHER): Payer: Medicare Other | Admitting: Cardiology

## 2011-04-01 VITALS — BP 151/66 | HR 70 | Resp 16 | Ht 72.0 in | Wt 195.0 lb

## 2011-04-01 DIAGNOSIS — Z95810 Presence of automatic (implantable) cardiac defibrillator: Secondary | ICD-10-CM

## 2011-04-01 DIAGNOSIS — I2510 Atherosclerotic heart disease of native coronary artery without angina pectoris: Secondary | ICD-10-CM

## 2011-04-01 DIAGNOSIS — I428 Other cardiomyopathies: Secondary | ICD-10-CM

## 2011-04-01 DIAGNOSIS — I4891 Unspecified atrial fibrillation: Secondary | ICD-10-CM

## 2011-04-01 DIAGNOSIS — I509 Heart failure, unspecified: Secondary | ICD-10-CM

## 2011-04-01 DIAGNOSIS — E785 Hyperlipidemia, unspecified: Secondary | ICD-10-CM

## 2011-04-01 NOTE — Telephone Encounter (Signed)
Patient wanted to inform me that Dr. Cato Mulligan will be on medical leave so his digoxin level will need to be managed by [place] until he returns in August. Informed him this is ok. Labs will be faxed to our office.

## 2011-04-01 NOTE — Telephone Encounter (Signed)
Per pt call, wanting to ask some questions about blood tests that are being performed tomorrow.

## 2011-04-01 NOTE — Patient Instructions (Signed)
Your physician recommends that you schedule a follow-up appointment in: 3 months with [facility]

## 2011-04-02 ENCOUNTER — Other Ambulatory Visit (INDEPENDENT_AMBULATORY_CARE_PROVIDER_SITE_OTHER): Payer: Medicare Other

## 2011-04-02 DIAGNOSIS — I509 Heart failure, unspecified: Secondary | ICD-10-CM

## 2011-04-02 LAB — BASIC METABOLIC PANEL
BUN: 48 mg/dL — ABNORMAL HIGH (ref 6–23)
CO2: 32 mEq/L (ref 19–32)
Calcium: 8.8 mg/dL (ref 8.4–10.5)
GFR: 26.8 mL/min — ABNORMAL LOW (ref >60.00)
Glucose, Bld: 324 mg/dL — ABNORMAL HIGH (ref 70–99)

## 2011-04-02 NOTE — Progress Notes (Signed)
Addended by: Rita Ohara R on: 04/02/2011 04:10 PM   Modules accepted: Orders

## 2011-04-08 ENCOUNTER — Telehealth: Payer: Self-pay | Admitting: Cardiology

## 2011-04-08 ENCOUNTER — Ambulatory Visit: Payer: Medicare Other

## 2011-04-08 DIAGNOSIS — I4891 Unspecified atrial fibrillation: Secondary | ICD-10-CM

## 2011-04-08 NOTE — Telephone Encounter (Signed)
Went over lab results with patient but I informed him that Dr. Riley Kill still needs to review his blood work results.

## 2011-04-08 NOTE — Patient Instructions (Signed)
Same dose of coumadin 

## 2011-04-08 NOTE — Telephone Encounter (Signed)
Pt calling re bloodwork results.

## 2011-04-20 DIAGNOSIS — Z4502 Encounter for adjustment and management of automatic implantable cardiac defibrillator: Secondary | ICD-10-CM | POA: Insufficient documentation

## 2011-04-20 NOTE — Assessment & Plan Note (Signed)
Currently class II.  Has ICD in place, and single lead with permanent atrial fibrillation at this point.

## 2011-04-20 NOTE — Assessment & Plan Note (Signed)
Being managed by Dr. Cato Mulligan.

## 2011-04-20 NOTE — Assessment & Plan Note (Signed)
Need to follow closely, and needs renal follow up at some point.  Followed closely by Dr. Cato Mulligan.

## 2011-04-20 NOTE — Assessment & Plan Note (Signed)
No recurrent symptoms at the present time.   

## 2011-04-20 NOTE — Progress Notes (Signed)
HPI:  Mr [name] [name] is in for follow up.  He feels good.  He is swimming 3-4 times per week.  Denies any chest pain.  Is able to get about without difficulty at this point in time.  Dr. 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  He is continuing to do well at this point.    Current Outpatient Prescriptions  Medication Sig Dispense Refill  . aspirin 81 MG tablet Take 81 mg by mouth daily.        . digoxin (LANOXIN) 0.125 MG tablet Take 125 mcg by mouth daily.        . furosemide (LASIX) 80 MG tablet Take 80 mg by mouth daily.        [name] glucose blood (FREESTYLE TEST STRIPS) test strip 1 each by Other route as needed. Use as instructed       . glyBURIDE (DIABETA) 1.25 MG tablet Take 1 tablet (1.25 mg total) by mouth 2 (two) times daily with a meal.  180 tablet  3  . hydrALAZINE (APRESOLINE) 25 MG tablet Take 1 tablet (25 mg total) by mouth 3 (three) times daily.  90 tablet  11  . isosorbide dinitrate (ISORDIL) 30 MG tablet Take 15 mg by mouth daily.       . metoprolol (TOPROL-XL) 100 MG 24 hr tablet Take 100 mg by mouth 2 (two) times daily.       . nitroGLYCERIN (NITROSTAT) 0.4 MG SL tablet Place 1 tablet (0.4 mg total) under the tongue every 5 (five) minutes as needed.  50 tablet  3  . potassium chloride SA (K-DUR,KLOR-CON) 20 MEQ tablet Take 20 mEq by mouth 2 (two) times daily.        [name] warfarin (COUMADIN) 5 MG tablet Take 5 mg by mouth as directed. 5mg  Monday and Thursday, 2.5mg  all others        No Known Allergies  Past Medical History  Diagnosis Date  . Atrial fibrillation 07/13/2006  . CARDIOMYOPATHY, PRIMARY, DILATED 08/07/2009  . COLON CANCER, HX OF 07/13/2006  . CONGESTIVE HEART FAILURE 07/13/2006  . CORONARY ARTERY DISEASE 07/13/2006  . PROSTATE CANCER, HX OF 07/13/2006  . RENAL DISEASE, CHRONIC, STAGE III 04/18/2009  . SKIN CANCER, HX OF 04/18/2009  . SLEEP APNEA 11/01/2008  . Hypertension   . Hyperlipidemia   . Diabetes mellitus   . Hyperkalemia   .  Arthritis     Past Surgical History  Procedure Date  . Colectomy   . Prostatectomy   . Penile prosthesis placement   . Removed prosthetic eye   . Ptca     stent placed  . Total knee arthroplasty   . St jude unify     Family History  Problem Relation Age of Onset  . Heart disease Father   . Throat cancer Mother     History   Social History  . Marital Status: Married    Spouse Name: N/A    Number of Children: N/A  . Years of Education: N/A   Occupational History  . Not on file.   Social History Main Topics  . Smoking status: Former Smoker    Quit date: 09/22/1966  . Smokeless tobacco: Not on file  . Alcohol Use: No  . Drug Use: No  . Sexually Active:    Other Topics Concern  . Not on file   Social History Narrative  . No narrative on file    ROS: Please see the HPI.  All other systems reviewed and negative.  PHYSICAL EXAM:  BP 151/66  Pulse 70  Resp 16  Ht 6' (1.829 m)  Wt 195 lb (88.451 kg)  BMI 26.45 kg/m2  General: Well developed, well nourished, in no acute distress. Head:  Normocephalic and atraumatic. Neck: no JVD Lungs: Clear to auscultation and percussion. Heart: Normal S1 and S2.  Soft apical murmur.  Abdomen:  Normal bowel sounds; soft; non tender; no organomegaly Pulses: Pulses normal in all 4 extremities. Extremities: No clubbing or cyanosis. No edema. Neurologic: Alert and oriented x 3.  EKG:  Background atrial fibrillation with ventricular pacing.  ASSESSMENT AND PLAN:

## 2011-04-20 NOTE — Assessment & Plan Note (Signed)
Rate is controlled.  Need to monitor dig level in environment of declining renal function.

## 2011-04-20 NOTE — Assessment & Plan Note (Signed)
Reduce function of proportion to underlying CAD.  Has had stenting of the LAD.  Has permanent atrial fibrillation.  Rate is controlled.  Functional status is quite good.  No complaints.

## 2011-05-02 ENCOUNTER — Emergency Department (HOSPITAL_COMMUNITY)
Admission: EM | Admit: 2011-05-02 | Discharge: 2011-05-02 | Disposition: A | Discharge status: Home / Self Care | Payer: Medicare Other | Attending: Emergency Medicine | Admitting: Emergency Medicine

## 2011-05-02 DIAGNOSIS — T63461A Toxic effect of venom of wasps, accidental (unintentional), initial encounter: Secondary | ICD-10-CM | POA: Insufficient documentation

## 2011-05-02 DIAGNOSIS — I4891 Unspecified atrial fibrillation: Secondary | ICD-10-CM | POA: Insufficient documentation

## 2011-05-02 DIAGNOSIS — Z950 Presence of cardiac pacemaker: Secondary | ICD-10-CM | POA: Insufficient documentation

## 2011-05-02 DIAGNOSIS — E119 Type 2 diabetes mellitus without complications: Secondary | ICD-10-CM | POA: Insufficient documentation

## 2011-05-02 DIAGNOSIS — Z7901 Long term (current) use of anticoagulants: Secondary | ICD-10-CM | POA: Insufficient documentation

## 2011-05-02 DIAGNOSIS — I509 Heart failure, unspecified: Secondary | ICD-10-CM | POA: Insufficient documentation

## 2011-05-02 DIAGNOSIS — I10 Essential (primary) hypertension: Secondary | ICD-10-CM | POA: Insufficient documentation

## 2011-05-02 DIAGNOSIS — T6391XA Toxic effect of contact with unspecified venomous animal, accidental (unintentional), initial encounter: Secondary | ICD-10-CM | POA: Insufficient documentation

## 2011-05-06 ENCOUNTER — Other Ambulatory Visit: Payer: Self-pay | Admitting: Nephrology

## 2011-05-06 ENCOUNTER — Inpatient Hospital Stay (HOSPITAL_COMMUNITY)
Admission: EM | Admit: 2011-05-06 | Discharge: 2011-05-08 | DRG: 638 | Disposition: A | Discharge status: Home / Self Care | Payer: Medicare Other | Attending: Internal Medicine | Admitting: Internal Medicine

## 2011-05-06 ENCOUNTER — Ambulatory Visit (INDEPENDENT_AMBULATORY_CARE_PROVIDER_SITE_OTHER): Payer: Medicare Other | Admitting: Internal Medicine

## 2011-05-06 ENCOUNTER — Encounter: Payer: Self-pay | Admitting: Cardiology

## 2011-05-06 DIAGNOSIS — Z7901 Long term (current) use of anticoagulants: Secondary | ICD-10-CM

## 2011-05-06 DIAGNOSIS — I509 Heart failure, unspecified: Secondary | ICD-10-CM | POA: Diagnosis present

## 2011-05-06 DIAGNOSIS — N189 Chronic kidney disease, unspecified: Secondary | ICD-10-CM | POA: Diagnosis present

## 2011-05-06 DIAGNOSIS — S60569A Insect bite (nonvenomous) of unspecified hand, initial encounter: Secondary | ICD-10-CM | POA: Diagnosis present

## 2011-05-06 DIAGNOSIS — I4891 Unspecified atrial fibrillation: Secondary | ICD-10-CM | POA: Diagnosis present

## 2011-05-06 DIAGNOSIS — N179 Acute kidney failure, unspecified: Secondary | ICD-10-CM | POA: Diagnosis present

## 2011-05-06 DIAGNOSIS — Z79899 Other long term (current) drug therapy: Secondary | ICD-10-CM

## 2011-05-06 DIAGNOSIS — Z95810 Presence of automatic (implantable) cardiac defibrillator: Secondary | ICD-10-CM

## 2011-05-06 DIAGNOSIS — I5022 Chronic systolic (congestive) heart failure: Secondary | ICD-10-CM | POA: Diagnosis present

## 2011-05-06 DIAGNOSIS — Z85038 Personal history of other malignant neoplasm of large intestine: Secondary | ICD-10-CM

## 2011-05-06 DIAGNOSIS — W57XXXA Bitten or stung by nonvenomous insect and other nonvenomous arthropods, initial encounter: Secondary | ICD-10-CM | POA: Diagnosis present

## 2011-05-06 DIAGNOSIS — IMO0001 Reserved for inherently not codable concepts without codable children: Principal | ICD-10-CM | POA: Diagnosis present

## 2011-05-06 DIAGNOSIS — Z8546 Personal history of malignant neoplasm of prostate: Secondary | ICD-10-CM

## 2011-05-06 DIAGNOSIS — E871 Hypo-osmolality and hyponatremia: Secondary | ICD-10-CM | POA: Diagnosis present

## 2011-05-06 DIAGNOSIS — N184 Chronic kidney disease, stage 4 (severe): Secondary | ICD-10-CM

## 2011-05-06 DIAGNOSIS — L02519 Cutaneous abscess of unspecified hand: Secondary | ICD-10-CM | POA: Diagnosis present

## 2011-05-06 DIAGNOSIS — D696 Thrombocytopenia, unspecified: Secondary | ICD-10-CM | POA: Diagnosis present

## 2011-05-06 DIAGNOSIS — I129 Hypertensive chronic kidney disease with stage 1 through stage 4 chronic kidney disease, or unspecified chronic kidney disease: Secondary | ICD-10-CM | POA: Diagnosis present

## 2011-05-06 DIAGNOSIS — Z7982 Long term (current) use of aspirin: Secondary | ICD-10-CM

## 2011-05-06 DIAGNOSIS — Z9861 Coronary angioplasty status: Secondary | ICD-10-CM

## 2011-05-06 DIAGNOSIS — I2510 Atherosclerotic heart disease of native coronary artery without angina pectoris: Secondary | ICD-10-CM | POA: Diagnosis present

## 2011-05-06 LAB — COMPREHENSIVE METABOLIC PANEL
Alkaline Phosphatase: 52 U/L (ref 39–117)
BUN: 69 mg/dL — ABNORMAL HIGH (ref 6–23)
Chloride: 91 mEq/L — ABNORMAL LOW (ref 96–112)
Creatinine, Ser: 2.87 mg/dL — ABNORMAL HIGH (ref 0.50–1.35)
GFR calc Af Amer: 26 mL/min — ABNORMAL LOW (ref >60)
GFR calc non Af Amer: 21 mL/min — ABNORMAL LOW (ref >60)
Glucose, Bld: 706 mg/dL (ref 70–99)
Potassium: 4.6 mEq/L (ref 3.5–5.1)
Total Bilirubin: 0.3 mg/dL (ref 0.3–1.2)

## 2011-05-06 LAB — POCT INR: INR: 3.5

## 2011-05-06 LAB — DIFFERENTIAL
Basophils Absolute: 0 10*3/uL (ref 0.0–0.1)
Eosinophils Relative: 0 % (ref 0–5)
Lymphocytes Relative: 7 % — ABNORMAL LOW (ref 12–46)
Lymphs Abs: 0.7 10*3/uL (ref 0.7–4.0)
Monocytes Absolute: 0.5 10*3/uL (ref 0.1–1.0)
Neutro Abs: 8.5 10*3/uL — ABNORMAL HIGH (ref 1.7–7.7)

## 2011-05-06 LAB — CBC
HCT: 40.7 % (ref 39.0–52.0)
Hemoglobin: 14.4 g/dL (ref 13.0–17.0)
MCV: 89.3 fL (ref 78.0–100.0)
RDW: 12.6 % (ref 11.5–15.5)
WBC: 9.7 10*3/uL (ref 4.0–10.5)

## 2011-05-06 LAB — URINALYSIS, ROUTINE W REFLEX MICROSCOPIC
Glucose, UA: >1000 mg/dL — AB
Ketones, ur: NEGATIVE mg/dL
Leukocytes, UA: NEGATIVE
Nitrite: NEGATIVE
Protein, ur: NEGATIVE mg/dL
Urobilinogen, UA: 0.2 mg/dL (ref 0.0–1.0)

## 2011-05-06 LAB — POCT I-STAT 3, ART BLOOD GAS (G3+)
Bicarbonate: 23 mEq/L (ref 20.0–24.0)
O2 Saturation: 98 %
TCO2: 24 mmol/L (ref 0–100)
pCO2 arterial: 33.1 mmHg — ABNORMAL LOW (ref 35.0–45.0)
pH, Arterial: 7.449 (ref 7.350–7.450)

## 2011-05-06 LAB — POCT I-STAT 3, VENOUS BLOOD GAS (G3P V)
pCO2, Ven: 41.8 mmHg — ABNORMAL LOW (ref 45.0–50.0)
pH, Ven: 7.366 — ABNORMAL HIGH (ref 7.250–7.300)
pO2, Ven: 51 mmHg — ABNORMAL HIGH (ref 30.0–45.0)

## 2011-05-06 LAB — PROTIME-INR
INR: 3.11 — ABNORMAL HIGH (ref 0.00–1.49)
Prothrombin Time: 32.5 seconds — ABNORMAL HIGH (ref 11.6–15.2)

## 2011-05-06 LAB — GLUCOSE, CAPILLARY: Glucose-Capillary: >600 mg/dL (ref 70–99)

## 2011-05-06 NOTE — Patient Instructions (Signed)
2.5mg . Every day Check in 2 weeks

## 2011-05-07 ENCOUNTER — Inpatient Hospital Stay (HOSPITAL_COMMUNITY): Payer: Medicare Other

## 2011-05-07 ENCOUNTER — Other Ambulatory Visit: Payer: Medicare Other

## 2011-05-07 LAB — GLUCOSE, CAPILLARY
Glucose-Capillary: 139 mg/dL — ABNORMAL HIGH (ref 70–99)
Glucose-Capillary: 210 mg/dL — ABNORMAL HIGH (ref 70–99)
Glucose-Capillary: 225 mg/dL — ABNORMAL HIGH (ref 70–99)
Glucose-Capillary: 563 mg/dL (ref 70–99)
Glucose-Capillary: 571 mg/dL (ref 70–99)
Glucose-Capillary: 237 mg/dL — ABNORMAL HIGH (ref 70–99)

## 2011-05-07 LAB — CBC
MCHC: 35.4 g/dL (ref 30.0–36.0)
Platelets: 124 10*3/uL — ABNORMAL LOW (ref 150–400)
RDW: 12.4 % (ref 11.5–15.5)
WBC: 9 10*3/uL (ref 4.0–10.5)

## 2011-05-07 LAB — COMPREHENSIVE METABOLIC PANEL
ALT: 32 U/L (ref 0–53)
Albumin: 3.4 g/dL — ABNORMAL LOW (ref 3.5–5.2)
Calcium: 9 mg/dL (ref 8.4–10.5)
GFR calc Af Amer: 28 mL/min — ABNORMAL LOW (ref >60)
Glucose, Bld: 243 mg/dL — ABNORMAL HIGH (ref 70–99)
Sodium: 138 mEq/L (ref 135–145)
Total Protein: 6.6 g/dL (ref 6.0–8.3)

## 2011-05-07 LAB — URINALYSIS, ROUTINE W REFLEX MICROSCOPIC
Bilirubin Urine: NEGATIVE
Ketones, ur: NEGATIVE mg/dL
Nitrite: NEGATIVE
Protein, ur: 30 mg/dL — AB
Urobilinogen, UA: 0.2 mg/dL (ref 0.0–1.0)

## 2011-05-07 LAB — TSH: TSH: 0.096 u[IU]/mL — ABNORMAL LOW (ref 0.350–4.500)

## 2011-05-07 LAB — PROTIME-INR
INR: 3.41 — ABNORMAL HIGH (ref 0.00–1.49)
Prothrombin Time: 34.9 seconds — ABNORMAL HIGH (ref 11.6–15.2)

## 2011-05-07 LAB — BASIC METABOLIC PANEL
BUN: 59 mg/dL — ABNORMAL HIGH (ref 6–23)
Chloride: 105 mEq/L (ref 96–112)
GFR calc Af Amer: 34 mL/min — ABNORMAL LOW (ref >60)
Potassium: 3.8 mEq/L (ref 3.5–5.1)

## 2011-05-07 LAB — HEMOGLOBIN A1C
Hgb A1c MFr Bld: 12 % — ABNORMAL HIGH (ref <5.7)
Mean Plasma Glucose: 298 mg/dL — ABNORMAL HIGH (ref <117)

## 2011-05-07 LAB — LIPID PANEL
HDL: 38 mg/dL — ABNORMAL LOW (ref >39)
LDL Cholesterol: 122 mg/dL — ABNORMAL HIGH (ref 0–99)
Total CHOL/HDL Ratio: 5.8 RATIO
VLDL: 62 mg/dL — ABNORMAL HIGH (ref 0–40)

## 2011-05-07 LAB — CARDIAC PANEL(CRET KIN+CKTOT+MB+TROPI): Relative Index: INVALID (ref 0.0–2.5)

## 2011-05-08 ENCOUNTER — Telehealth: Payer: Self-pay | Admitting: Internal Medicine

## 2011-05-08 LAB — GLUCOSE, CAPILLARY: Glucose-Capillary: 291 mg/dL — ABNORMAL HIGH (ref 70–99)

## 2011-05-08 LAB — BASIC METABOLIC PANEL
Chloride: 104 mEq/L (ref 96–112)
Creatinine, Ser: 2.22 mg/dL — ABNORMAL HIGH (ref 0.50–1.35)
GFR calc Af Amer: 35 mL/min — ABNORMAL LOW (ref >60)

## 2011-05-08 LAB — CBC
Hemoglobin: 14.5 g/dL (ref 13.0–17.0)
MCHC: 35.5 g/dL (ref 30.0–36.0)
RBC: 4.66 MIL/uL (ref 4.22–5.81)

## 2011-05-08 LAB — PROTIME-INR
INR: 2.59 — ABNORMAL HIGH (ref 0.00–1.49)
Prothrombin Time: 28.2 seconds — ABNORMAL HIGH (ref 11.6–15.2)

## 2011-05-08 NOTE — Telephone Encounter (Signed)
Pt was just discharged from hospital and has to come in next wk to see Dr Cato Mulligan for hosp fup [id number] blood sugar issues. Blood Sugar was 771 when pt was admitted to hospital. Pls advise when to sch?

## 2011-05-12 NOTE — Telephone Encounter (Signed)
Pt appt scheduled for Wednesday 05/13/11

## 2011-05-12 NOTE — Telephone Encounter (Signed)
Pt called back to check on status of getting in to see Dr Cato Mulligan today for hospital fup. Pt said that this is extremely important that he gets in asap this wk. Pls call today.

## 2011-05-13 ENCOUNTER — Other Ambulatory Visit: Payer: Medicare Other

## 2011-05-13 ENCOUNTER — Encounter: Payer: Self-pay | Admitting: Internal Medicine

## 2011-05-14 ENCOUNTER — Encounter: Payer: Self-pay | Admitting: Internal Medicine

## 2011-05-14 ENCOUNTER — Ambulatory Visit (INDEPENDENT_AMBULATORY_CARE_PROVIDER_SITE_OTHER): Payer: Medicare Other | Admitting: Internal Medicine

## 2011-05-14 ENCOUNTER — Encounter: Payer: Medicare Other | Admitting: *Deleted

## 2011-05-14 ENCOUNTER — Ambulatory Visit (INDEPENDENT_AMBULATORY_CARE_PROVIDER_SITE_OTHER): Payer: Medicare Other | Admitting: *Deleted

## 2011-05-14 VITALS — BP 160/80 | HR 76 | Temp 98.2°F | Resp 16 | Ht 71.5 in | Wt 187.0 lb

## 2011-05-14 DIAGNOSIS — E119 Type 2 diabetes mellitus without complications: Secondary | ICD-10-CM

## 2011-05-14 DIAGNOSIS — I509 Heart failure, unspecified: Secondary | ICD-10-CM

## 2011-05-14 DIAGNOSIS — I428 Other cardiomyopathies: Secondary | ICD-10-CM

## 2011-05-14 LAB — ICD DEVICE OBSERVATION
DEVICE MODEL ICD: 595859
HV IMPEDENCE: 47 Ohm
LV LEAD IMPEDENCE ICD: 700 Ohm
MODE SWITCH EPISODES: 0
RV LEAD AMPLITUDE: 12 mv
RV LEAD IMPEDENCE ICD: 512.5 Ohm
RV LEAD THRESHOLD: 0.625 V
TOT-0008: 0
TOT-0009: 0
TOT-0010: 6
TZAT-0001SLOWVT: 1
TZAT-0004SLOWVT: 8
TZON-0010SLOWVT: 80 ms
TZST-0001SLOWVT: 2
TZST-0001SLOWVT: 3
TZST-0001SLOWVT: 5
TZST-0003SLOWVT: 30 J
TZST-0003SLOWVT: 40 J
VF: 0

## 2011-05-14 MED ORDER — INSULIN LISPRO PROT & LISPRO (75-25 MIX) 100 UNIT/ML ~~LOC~~ SUSP: 15.0000 [IU] | Freq: Two times a day (BID) | SUBCUTANEOUS | Status: DC

## 2011-05-14 NOTE — Progress Notes (Signed)
  Subjective:    Patient ID: [name] [name], male    DOB: 1930/10/10, 77 y.o.   MRN: [id number]  HPI 8/9 stung by wasp. Severe reaction right arm. [facility] ED treated with keflex, prednisone.  Developed polyuria/polydipsia. Went to dr Eliott Nine, found to have blood sugar >700.  Went to hospital treated for hyperglycemia. He is currently feeling well, no complaints. He is back on his regular medication regimen and is back to work!  Past Medical History  Diagnosis Date  . Atrial fibrillation 07/13/2006  . CARDIOMYOPATHY, PRIMARY, DILATED 08/07/2009  . COLON CANCER, HX OF 07/13/2006  . CONGESTIVE HEART FAILURE 07/13/2006  . CORONARY ARTERY DISEASE 07/13/2006  . PROSTATE CANCER, HX OF 07/13/2006  . RENAL DISEASE, CHRONIC, STAGE III 04/18/2009  . SKIN CANCER, HX OF 04/18/2009  . SLEEP APNEA 11/01/2008  . Hypertension   . Hyperlipidemia   . Diabetes mellitus   . Hyperkalemia   . Arthritis    Past Surgical History  Procedure Date  . Colectomy   . Prostatectomy   . Penile prosthesis placement   . Removed prosthetic eye   . Ptca     stent placed  . Total knee arthroplasty   . St jude unify     reports that he quit smoking about 44 years ago. He does not have any smokeless tobacco history on file. He reports that he does not drink alcohol or use illicit drugs. family history includes Heart disease in his father and Throat cancer in his mother. No Known Allergies    Review of Systems  patient denies chest pain, shortness of breath, orthopnea. Denies lower extremity edema, abdominal pain, change in appetite, change in bowel movements. Patient denies rashes, musculoskeletal complaints. No other specific complaints in a complete review of systems.      Objective:   Physical Exam   well-developed well-nourished male in no acute distress. HEENT exam atraumatic, normocephalic, neck supple without jugular venous distention. Chest clear to auscultation cardiac exam S1-S2 are regular. Abdominal  exam overweight with bowel sounds, soft and nontender. Extremities no edema. Neurologic exam is alert with a normal gait.       Assessment & Plan:

## 2011-05-14 NOTE — Patient Instructions (Signed)
Talk to pharmacy or insurance about insulin Either 75/25 mix or generic 70/30 mix are ok with me.

## 2011-05-14 NOTE — Assessment & Plan Note (Addendum)
CBGs at home are still > 200  Reviewed A1c---needs insulin Will use 70/30 or 75/25 based on insurance coverage. 40 minutes spent > 50% counselling.

## 2011-05-14 NOTE — Progress Notes (Signed)
ICD check 

## 2011-05-16 ENCOUNTER — Other Ambulatory Visit: Payer: Self-pay

## 2011-05-16 MED ORDER — INSULIN LISPRO PROT & LISPRO (75-25 MIX) 100 UNIT/ML ~~LOC~~ SUSP: 15.0000 [IU] | Freq: Two times a day (BID) | SUBCUTANEOUS | Status: DC

## 2011-05-20 ENCOUNTER — Other Ambulatory Visit: Payer: Medicare Other

## 2011-05-27 ENCOUNTER — Encounter: Payer: Self-pay | Admitting: Internal Medicine

## 2011-05-27 ENCOUNTER — Ambulatory Visit (INDEPENDENT_AMBULATORY_CARE_PROVIDER_SITE_OTHER): Payer: Medicare Other | Admitting: Internal Medicine

## 2011-05-27 VITALS — BP 172/90 | HR 76 | Temp 98.1°F | Ht 72.0 in | Wt 195.0 lb

## 2011-05-27 DIAGNOSIS — E119 Type 2 diabetes mellitus without complications: Secondary | ICD-10-CM

## 2011-05-27 DIAGNOSIS — I4891 Unspecified atrial fibrillation: Secondary | ICD-10-CM

## 2011-05-27 MED ORDER — INSULIN LISPRO PROT & LISPRO (75-25 MIX) 100 UNIT/ML ~~LOC~~ SUSP: 15.0000 [IU] | Freq: Two times a day (BID) | SUBCUTANEOUS | Status: DC

## 2011-05-27 NOTE — Assessment & Plan Note (Signed)
Continue insulin therapy Total time 10 minutes - all counseling related to refer DTC

## 2011-05-27 NOTE — Patient Instructions (Addendum)
Call your insurance company and see if they will cover shingles vaccine. If they will, call us and we will give it to you  5mg . On Monday and Thursday. All other days 2.5mg 

## 2011-05-27 NOTE — H&P (Signed)
[name] [name], [name] [name] NO.:  [id number]  MEDICAL RECORD NO.:  [id number]  LOCATION:  [facility]                         FACILITY:  [facility]  PHYSICIAN:  Eduard Clos, MDDATE OF BIRTH:  March 30, 1931  DATE OF ADMISSION:  05/06/2011 DATE OF DISCHARGE:                             HISTORY & PHYSICAL   PRIMARY CARE PHYSICIAN:  Valetta Mole. Swords, MD  PRIMARY CARDIOLOGIST:  Arturo Morton. Riley Kill, MD, [facility]  CHIEF COMPLAINT:  Increased blood sugar.  HISTORY OF PRESENTING ILLNESS:  A 77 year old male with known history of diabetes mellitus type 2, history of CAD, status post stent placement bare-metal, history of chronic systolic heart failure, last EF measured was in April 2011 was showing 30-35%, history of hypertension, atrial fibrillation, history of AICD placement and pacemaker placement, had a bite on his right hand 4 days ago by a wasp, after which, he had gone to his primary care physician who was started him on Keflex and prednisone tapering.  He is about to be turn with his prednisone dose today.  He had gone to his nephrologist, Dr. Elza Rafter office for follow up on reviewing his medication when he was found to have complaint of increase frequency of urination and his blood sugar was checked there was more than 700 and was instructed to come to the ER.  In the ER, it was confirmed to be more than 700.  The patient at this time is not in DKA, will be started on IV insulin  per GlucoStabilizer protocol, will be admitted for further observation.  The patient's right hand swelling at this time is considerably improved. He said it involved in his right hand, dorsal aspect and swelling had gone up to midforearm.  At this time, it is still mildly swollen, but there is no erythema or tenderness.  He is able to make a complete fist with no difficulty.  The patient denies any chest pain, shortness of breath.  Denies any nausea, vomiting, abdominal pain, dysuria, discharges,  or diarrhea, though he did have frequency of urination.  Denies any fever, chills, headache, or visual symptoms.  Denies any focal deficit.  PAST MEDICAL HISTORY: 1. History of systolic heart failure, last EF measured was in April     2011 was 30-35%. 2. History of AICD placement and pacemaker placement. 3. History of atrial fibrillation, on Coumadin. 4. History of CAD, status post stenting. 5. History of diabetes mellitus type 2. 6. History of CA of colon, status post partial colectomy 8 days ago. 7. History of prostate cancer, status post radical prostatectomy.  MEDICATIONS PRIOR TO ADMISSION: 1. Coumadin. 2. Aspirin 81 mg daily. 3. Lasix 80 mg daily. 4. Hydralazine 25 mg three times daily. 5. Klor-Con. 6. Metoprolol 100 mg p.o. b.i.d. 7. Nitroglycerin. 8. Digoxin 0.125 mg daily. 9. Glyburide 1.25 mg twice daily. 10.Imdur 30 mg daily temporarily.  The patient takes Benadryl, Keflex,     and prednisone.  ALLERGIES:  No known drug allergies.  FAMILY HISTORY:  Positive for mother dying of throat cancer at 59. Father died at CHF at 29.  Sister has hyperlipidemia.  SOCIAL HISTORY:  The patient quit smoking around 30 years ago.  He drinks  three to four beers a week and on occasion glass of wine.  Denies any drug abuse.  He is married, lives with his wife.  REVIEW OF SYSTEMS:  As per the history of presenting illness, nothing else significant.  PHYSICAL EXAMINATION:  GENERAL:  The patient examined at bedside, not in acute distress. VITAL SIGNS:  Blood pressure 180/80, pulse 67 per minute, temperature 98.1, respirations is 20 per minute, O2 sat 96%. HEENT:  Anicteric.  No pallor.  No discharge from ears, eyes, nose, or mouth. CHEST:  Bilateral air entry present.  No rhonchi.  No crepitation. HEART:  S1, S2 heard. ABDOMEN:  Soft, nontender.  Bowel sounds heard. CNS:  The patient is alert, awake, oriented to time, place, and person. Moves upper and lower extremities  5/5. EXTREMITIES:  There is mild swelling in the right hand involving up to his wrist, but the patient is able to make all functions normally.  He is able to flex, extend his wrist, make a fist of his hand.  There is no tenderness.  Pulses are felt well.  Extremities look normal.  LABORATORY DATA:  CBC; WBC is 9.7, hemoglobin is 14.4, hematocrit is 40.7, platelets 140, neutrophils 88%.  Complete metabolic panel; sodium 127, potassium 4.6, chloride 91, carbon dioxide 23, anion gap is 13, glucose 706, BUN 69, creatinine 2.8, total bilirubin 0.3, alkaline phosphatase 52, AST 23, ALT 36, total protein 3.8, calcium 8.9.  UA shows negative for nitrites and leukocytes.  Urine glucose more than 1000, ketones negative, small blood.  ABG shows a pH of 7.4, pCO2 of 33.1, and pO2 of 91.  ASSESSMENT: 1. Severe hyperglycemia with uncontrolled diabetes mellitus type 2. 2. Mild hyponatremia. 3. Chronic kidney disease, baseline creatinine around 2.3. 4. History of coronary artery disease, status post stenting. 5. Chronic systolic heart failure, last ejection fraction measured in     April 11 was 30-35%. 6. History of hypertension. 7. History of atrial fibrillation, on Coumadin, presently rate     controlled. 8. History of automatic implantable cardioverter-defibrillator     placement and pacemaker placement. 9. History of cancer of colon, status post partial colectomy. 10.History of cancer of prostate, status post prostatectomy. 11.Mild thrombocytopenia.  PLAN: 1. At this time, I will admit the patient to telemetry. 2. For his uncontrolled diabetes mellitus type 2 with severe     hyperglycemia, the patient at this time is not in DKA.  His blood     sugar increased is most likely from the prednisone dose.  At this     time, we are going to start the patient on GlucoStabilizer.  I am     going to gently hydrate the patient with normal saline for only 1     liter, after which, we will discontinue  all IV fluids.  We will     restart his Lasix again tomorrow morning as soon as possible and     once his blood sugar is controlled, we will probably put him on a     small dose of long-acting insulin and observe in the hospital to     see there is any further excursions in his blood sugar, which I     feel get better with discontinuation of his prednisone. 3. Right hand swelling due to recent insect bite.  The patient was on     antibiotics, which I am going to continue present.  Presently, I am     going to place him on doxycycline.  He does have mild swelling, no     tenderness, discontinue with prednisone for now and if the patient     did not have a tetanus shot before, we will administered one. 4. Mild thrombocytopenia, reviewing his chart, he does have mild     thrombocytopenia previously also, start new. 5. Chronic kidney disease.  There is mild increase in his creatinine     at this time from 2.3-2.8, which we are going to closely follow.     We will also be following his intake, output and daily weights.  As     suggested earlier, we need to restart his Lasix tomorrow.  At this     time, I am gently hydrating until his blood sugar gets control and     we will not be hydrating more than 1 liter. 6. History of atrial fibrillation, presently rate controlled.  We will     check a digoxin level.  We will continue his digoxin and metoprolol     and Coumadin per pharmacy. 7. History of coronary artery disease, presently chest pain free.  We     will continue his aspirin.  We will continue his metoprolol. 8. Further recommendation based on test order and clinical course.     Eduard Clos, MD     ANK/MEDQ  D:  05/06/2011  T:  05/06/2011  Job:  [id number]  cc:   Valetta Mole. Swords, MD Arturo Morton. Riley Kill, MD, [facility]  Electronically Signed by Midge Minium MD on 05/27/2011 09:23:57 AM

## 2011-05-27 NOTE — Progress Notes (Signed)
  Subjective:    Patient ID: [name] [name], male    DOB: October 09, 1930, 77 y.o.   MRN: [id number]  HPI  DM better controlled with insulin He is feeling well He has done a lot of home work related to expense of insulin  Review of Systems     Objective:   Physical Exam        Assessment & Plan:

## 2011-05-27 NOTE — Progress Notes (Signed)
Addended by: Rita Ohara R on: 05/27/2011 09:12 AM   Modules accepted: Orders

## 2011-05-29 NOTE — Discharge Summary (Signed)
[name], [name] NO.:  [id number]  MEDICAL RECORD NO.:  [id number]  LOCATION:  4734                         FACILITY:  [facility]  PHYSICIAN:  Ramiro Harvest, MD    DATE OF BIRTH:  08-08-1931  DATE OF ADMISSION:  05/06/2011 DATE OF DISCHARGE:  05/08/2011                        DISCHARGE SUMMARY    PRIMARY CARE PHYSICIAN:  Valetta Mole. Swords, MD  DISCHARGE DIAGNOSES: 1. Severe hyperglycemia with uncontrolled type 2 diabetes, hemoglobin     A1c of 12, resolved. 2. Abnormal TSH. 3. Hyponatremia, resolved. 4. Right hand swelling/insect bite, improved. 5. Acute on chronic kidney disease versus progressive chronic kidney     disease.  Creatinine on day of discharge was 2.22. 6. History of atrial fibrillation. 7. History of coronary artery disease status post stenting. 8. History of systolic heart failure, EF of 16-10%, April 2011. 9. Status post AICD placement and pacemaker placement. 10.History of colonic cancer status post partial colectomy. 11.History of prostate cancer status post radical prostatectomy.  DISCHARGE MEDICATIONS: 1. Keflex 250 mg p.o. q.i.d. x2 more days. 2. Aspirin 81 mg p.o. daily. 3. Coumadin 2.5 mg p.o. daily. 4. Digoxin 0.125 mg p.o. daily. 5. Lasix 80 mg p.o. daily. 6. Glyburide 1.25 mg p.o. b.i.d. 7. Hydralazine 25 mg p.o. t.i.d. 8. Isosorbide mononitrate XR 30 mg half a tablet p.o. daily. 9. Nitroglycerin 0.4 mg sublingual every 5 minutes x3 p.r.n. 10.Potassium chloride 20 mEq p.o. b.i.d. 11.Toprol-XL 100 mg p.o. b.i.d.  DISPOSITION AND FOLLOWUP:  The patient will be discharged home.  The patient is to follow up with PCP in 1-week post discharge and follow up the patient's diabetes will need to be reassessed, may consider starting the patient on insulin secondary to his uncontrolled type 2 diabetes with his A1c of 12.  The patient will also need repeat thyroid function studies done in about 4-6 weeks to follow up on his abnormal  TSH obtained during this hospitalization.  The patient is also to get his Coumadin checked 1-week post discharge.  The patient is also to follow up with his nephrologist, Dr. Camille Bal, as previously scheduled. The patient did get his renal ultrasound done while in-house.  CONSULTATIONS DONE:  None.  PROCEDURES PERFORMED:  Renal ultrasound was done, May 07, 2011, showed no hydronephrosis, bilateral renal cyst as noted previously.  BRIEF ADMISSION HISTORY AND PHYSICAL:  [name] is a 77 year old Caucasian gentleman with known history of type 2 diabetes, history of coronary artery disease status post stent placement with bare-metal stent, history of chronic systolic heart failure, EF of 96-04%, history of hypertension, AFib, history of AICD placement and permanent pacemaker placement who had a bite on his right hand 4 days ago by a wasp after which he had gone to his PCP who started him on Keflex and prednisone tapering.  The patient was about to have his last prednisone dose on the day of admission.  He had gone to see his nephrologist Dr. Elza Rafter for followup.  On reviewing his medication, he was found to have complaint of increased frequency of urination.  His blood sugars were checked and were greater than 700.  He was instructed to present to the ED.  In the ED, it was confirmed to be greater than 700.  The patient was not in DKA, was started on insulin IV per glucose stabilizer protocol and was admitted for further observation.  The patient did have right hand swelling at that time, which had considerably improved.  He stated it involved his right hand dorsal aspect and swelling that had gone up to his mid forearm.  He was still mildly swollen, but there was no erythema, no tenderness, able to make a complete fist without difficulty.  The patient denied any chest pain or shortness of breath. No nausea.  No vomiting.  No abdominal pain.  No dysuria.   No discharges.  No diarrhea.  Though he did have frequent urination.  The patient denied any fever.  No chills.  No headache.  No visual symptoms. Denied any focal deficits.  For the rest of admission history and physical, please see H and P dictated by Dr. Toniann Fail of job [id number].  HOSPITAL COURSE: 1. Severe hyperglycemia with uncontrolled type 2 diabetes with an A1c     of 12.  The patient was admitted with severe hyperglycemia.  The     patient was not in DKA.  He was placed on IV fluids.  It was felt     that his elevated CBGs were in part secondary to recent prednisone     use.  The patient was placed on the glucose stabilizer and hydrated     gently.  His diuretics were held and he was monitored and followed.     Once the patient's sugars were below the 200s consistently, he was     placed on Lantus 10 units, which was overlapped with a glucose     stabilizer.  Glucose stabilizer was subsequently discontinued.  He     was placed on a sliding scale insulin, was monitored, and followed.     The patient's sugars during this hospitalization ranged from the     140s to the mid 200s.  The patient remained in stable and improved     condition.  Cardiac enzymes, which were cycled were negative x3.     TSH, which was obtained was decreased at 0.096, however, free T4     came back within normal limits at 1.01.  Urinalysis also which was     done was negative for any acute infection.  The patient remained     afebrile and the patient improved clinically.  The patient will be     discharged home in stable and improved condition to follow up with     his PCP as stated above for possible starting of insulin for his     diabetes.  He was seen by the diabetic educator who taught the     patient how to use insulin pens prior to his discharge.  The     patient will be discharged home with follow up with his PCP. 2. Abnormal TSH.  In the workup of  severe hyperglycemia, a TSH was     obtained.   TSH came back decreased at 0.096 and free T4 was     subsequently obtained, which came back within normal limits at     1.01.  The patient remained asymptomatic.  The patient will need a     repeat thyroid function studies done in about 4-6 weeks. 3. Hyponatremia.  On admission, the patient was noted to be in mild     hyponatremia with a sodium level  of 127.  It was felt this was     likely secondary to severe hyperglycemia.  The patient was hydrated     with IV fluids.  His diuretics were held.  His hyponatremia     improved and had resolved by day of discharge.  On day of     discharge, the patient's sodium was 139. 4. Right hand swelling.  The patient had recently been started on     Keflex for treatment of a insect bite on his right hand, which had     improved significantly.  During the hospitalization, the patient     was maintained on doxycycline.  He will be discharged home back on     his home regimen of Keflex for two more days to complete a 1-week     course of antibiotic therapy. 5. Acute on chronic kidney disease versus progressive chronic kidney     disease.  On admission, the patient was noted to have a creatinine,     which was higher than his baseline creatinine of 2.3.  The     patient's creatinine on day of admission was 2.87.  The patient's     Lasix was held.  He was hydrated gently with IV fluids.  A renal     ultrasound was obtained, which was negative for hydronephrosis, but     showed bilateral renal cysts.  As I stated above, the patient was     asymptomatic.  The patient did have a state that this was being     followed up as an outpatient per his nephrologist.  The patient's     renal function improved on a daily basis such that by day of     discharge his creatinine was down to 2.22.  The patient's Lasix is     being resumed.  He will need repeat labs checked in about 1-week     and even when he follows up with his PCP and his renal doctor who     is  following up on his chronic kidney disease.  The rest of the patient's chronic medical issues remained stable throughout the hospitalization and the patient was discharged in stable and improved condition.  On day of discharge vital signs, temperature 97.6, pulse of 73, respirations 18, blood pressure 160/82, satting 95% on room air.  Discharge labs, sodium 139, potassium 3.5, chloride 104, bicarb 26, glucose 177, BUN 53, creatinine 2.22, calcium of 8.5.  PT of 28.2, INR of 2.59.  CBC with a white count of 8.2, hemoglobin 14.5, hematocrit 40.8, and a platelet count of 125,000.  Free T4 of 1.01, TSH of 0.096, hemoglobin A1c of 12.0.  It has been a pleasure taking care of [name].     Ramiro Harvest, MD     DT/MEDQ  D:  05/08/2011  T:  05/08/2011  Job:  [id number]  cc:   Valetta Mole. Swords, MD [facility] Sal[facility] Eliott Nine, M.D.  Electronically Signed by Ramiro Harvest MD on 05/29/2011 12:13:27 PM

## 2011-06-03 ENCOUNTER — Ambulatory Visit (INDEPENDENT_AMBULATORY_CARE_PROVIDER_SITE_OTHER): Payer: Medicare Other | Admitting: Internal Medicine

## 2011-06-03 DIAGNOSIS — I4891 Unspecified atrial fibrillation: Secondary | ICD-10-CM

## 2011-06-03 LAB — POCT INR: INR: 1.6

## 2011-06-03 NOTE — Patient Instructions (Signed)
5mg . On Monday and Thursday. All other days 2.5mg ,check in one month By Dr.Jenkins

## 2011-06-19 ENCOUNTER — Ambulatory Visit: Payer: Medicare Other | Admitting: *Deleted

## 2011-06-19 NOTE — Progress Notes (Signed)
  Patient was seen on 06/19/2011 for their final follow-up as a part of the diabetes self-management courses at the [facility]. The following learning objectives were met by your patient during this course:  Patient self reports the following:  Diabetes control has not improved improved recently due to incidence of wasp sting and treatment with Prednisone which caused significant hyperglycemia Changes in treatment plan: Patient states he is now on Humalog mix 75/25 15 units BID. He expressed concern of cost of insulin too. Confidence with ability to manage diabetes: Patient expresses frustration that he has little improvement in his diabetes control yet and does not have the ability to correct high BGs on this insulin regime Areas for improvement with diabetes self-care: Patient provided with information regarding insulin options including vials vs. Pens, self mixing insulin, and concept of basal - bolus insulin of Lantus and Humalog where pt could adjust meal insulin based on Carb intake and correction for hyperglycemia when needed. Willingness to participate in diabetes management: Patient very interested in being more involved with diabetes management including options for insulin delivery.   Follow-Up Plan: Patient to call if any further questions or if insulin regime changes.

## 2011-06-20 LAB — URINALYSIS, ROUTINE W REFLEX MICROSCOPIC
Bilirubin Urine: NEGATIVE
Hgb urine dipstick: NEGATIVE
Nitrite: NEGATIVE
Specific Gravity, Urine: 1.02
Urobilinogen, UA: 0.2
pH: 5

## 2011-06-20 LAB — CARDIAC PANEL(CRET KIN+CKTOT+MB+TROPI)
CK, MB: 3.2
CK, MB: 1.7
CK, MB: 1.9
CK, MB: 2.1
Relative Index: 2
Relative Index: 1.7
Relative Index: INVALID
Total CK: 158
Total CK: 111
Total CK: 164
Total CK: 57
Total CK: 81
Troponin I: 0.01

## 2011-06-20 LAB — COMPREHENSIVE METABOLIC PANEL
ALT: 22
AST: 21
Albumin: 3.9
Alkaline Phosphatase: 44
BUN: 36 — ABNORMAL HIGH
BUN: 37 — ABNORMAL HIGH
CO2: 24
CO2: 29
Calcium: 9.3
Chloride: 106
Chloride: 105
Creatinine, Ser: 2.32 — ABNORMAL HIGH
Creatinine, Ser: 2.42 — ABNORMAL HIGH
Creatinine, Ser: 1.98 — ABNORMAL HIGH
GFR calc Af Amer: 40 — ABNORMAL LOW
GFR calc non Af Amer: 28 — ABNORMAL LOW
GFR calc non Af Amer: 33 — ABNORMAL LOW
Glucose, Bld: 119 — ABNORMAL HIGH
Glucose, Bld: 128 — ABNORMAL HIGH
Glucose, Bld: 124 — ABNORMAL HIGH
Potassium: 3.4 — ABNORMAL LOW
Total Bilirubin: 1
Total Bilirubin: 0.8
Total Protein: 6.7

## 2011-06-20 LAB — PROTIME-INR
INR: 1.9 — ABNORMAL HIGH
INR: 2.1 — ABNORMAL HIGH
INR: 2.3 — ABNORMAL HIGH
INR: 2.6 — ABNORMAL HIGH
INR: 2.9 — ABNORMAL HIGH
INR: 1.1
INR: 1.1
INR: 1.3
INR: 1.4
Prothrombin Time: 23 — ABNORMAL HIGH
Prothrombin Time: 24.4 — ABNORMAL HIGH
Prothrombin Time: 29.2 — ABNORMAL HIGH
Prothrombin Time: 32.2 — ABNORMAL HIGH
Prothrombin Time: 14.3
Prothrombin Time: 14.3
Prothrombin Time: 15.6 — ABNORMAL HIGH
Prothrombin Time: 16.5 — ABNORMAL HIGH
Prothrombin Time: 18.1 — ABNORMAL HIGH
Prothrombin Time: 21.2 — ABNORMAL HIGH

## 2011-06-20 LAB — CBC
HCT: 38.9 — ABNORMAL LOW
HCT: 44.2
HCT: 37.5 — ABNORMAL LOW
Hemoglobin: 13.2
Hemoglobin: 15
Hemoglobin: 13
Hemoglobin: 13.8
Hemoglobin: 13.9
Hemoglobin: 14.6
MCHC: 33.9
MCHC: 34.8
MCHC: 33.6
MCHC: 34.4
MCHC: 34.8
MCV: 90.1
MCV: 90.3
MCV: 89.2
MCV: 90.3
Platelets: 128 — ABNORMAL LOW
Platelets: 133 — ABNORMAL LOW
Platelets: 165
Platelets: 131 — ABNORMAL LOW
Platelets: 131 — ABNORMAL LOW
Platelets: 140 — ABNORMAL LOW
Platelets: 142 — ABNORMAL LOW
Platelets: 147 — ABNORMAL LOW
Platelets: 147 — ABNORMAL LOW
RBC: 4.31
RBC: 4.47
RBC: 4.53
RBC: 4.55
RBC: 4.77
RDW: 13.9
RDW: 13.8
RDW: 13.8
RDW: 13.8
RDW: 14.1
RDW: 14.1
WBC: 5.3
WBC: 5.9
WBC: 5.7
WBC: 8.1

## 2011-06-20 LAB — BASIC METABOLIC PANEL
BUN: 33 — ABNORMAL HIGH
BUN: 22
BUN: 24 — ABNORMAL HIGH
BUN: 27 — ABNORMAL HIGH
BUN: 29 — ABNORMAL HIGH
CO2: 25
CO2: 23
CO2: 25
Calcium: 8.8
Calcium: 8.6
Calcium: 8.7
Calcium: 8.7
Calcium: 8.8
Chloride: 106
Chloride: 110
Chloride: 110
Chloride: 112
Creatinine, Ser: 2.3 — ABNORMAL HIGH
Creatinine, Ser: 1.84 — ABNORMAL HIGH
Creatinine, Ser: 1.9 — ABNORMAL HIGH
Creatinine, Ser: 1.92 — ABNORMAL HIGH
Creatinine, Ser: 1.95 — ABNORMAL HIGH
GFR calc Af Amer: 34 — ABNORMAL LOW
GFR calc Af Amer: 41 — ABNORMAL LOW
GFR calc Af Amer: 42 — ABNORMAL LOW
GFR calc Af Amer: 42 — ABNORMAL LOW
GFR calc Af Amer: 42 — ABNORMAL LOW
GFR calc non Af Amer: 32 — ABNORMAL LOW
GFR calc non Af Amer: 34 — ABNORMAL LOW
GFR calc non Af Amer: 34 — ABNORMAL LOW
GFR calc non Af Amer: 36 — ABNORMAL LOW
Glucose, Bld: 116 — ABNORMAL HIGH
Glucose, Bld: 117 — ABNORMAL HIGH
Glucose, Bld: 130 — ABNORMAL HIGH
Potassium: 3.2 — ABNORMAL LOW
Potassium: 3.4 — ABNORMAL LOW
Potassium: 3.5
Potassium: 3.5
Sodium: 139
Sodium: 140
Sodium: 142
Sodium: 144

## 2011-06-20 LAB — APTT
aPTT: 51 — ABNORMAL HIGH
aPTT: 60 — ABNORMAL HIGH
aPTT: 48 — ABNORMAL HIGH

## 2011-06-20 LAB — CK TOTAL AND CKMB (NOT AT ARMC)
CK, MB: 3.6
CK, MB: 1.6
Total CK: 52

## 2011-06-20 LAB — POCT I-STAT, CHEM 8
Creatinine, Ser: 2 — ABNORMAL HIGH
HCT: 40
Hemoglobin: 13.6
Potassium: 3.5
Sodium: 142

## 2011-06-20 LAB — DIFFERENTIAL
Basophils Relative: 1
Lymphocytes Relative: 22
Lymphs Abs: 1.9
Monocytes Absolute: 0.6
Monocytes Relative: 7
Neutro Abs: 6
Neutrophils Relative %: 70

## 2011-06-20 LAB — PROTEIN ELECTROPH W RFLX QUANT IMMUNOGLOBULINS
Alpha-1-Globulin: 3.6
Alpha-2-Globulin: 8.4
Beta 2: 5.2
Beta Globulin: 6.7
Gamma Globulin: 14.3
M-Spike, %: NOT DETECTED

## 2011-06-20 LAB — GLUCOSE, CAPILLARY
Glucose-Capillary: 128 — ABNORMAL HIGH
Glucose-Capillary: 135 — ABNORMAL HIGH
Glucose-Capillary: 136 — ABNORMAL HIGH
Glucose-Capillary: 140 — ABNORMAL HIGH
Glucose-Capillary: 144 — ABNORMAL HIGH

## 2011-06-20 LAB — UIFE/LIGHT CHAINS/TP QN, 24-HR UR
Albumin, U: DETECTED
Alpha 1, Urine: DETECTED — AB
Alpha 2, Urine: DETECTED — AB
Free Kappa Lt Chains,Ur: 12.5 — ABNORMAL HIGH (ref 0.04–1.51)

## 2011-06-20 LAB — RENAL FUNCTION PANEL
BUN: 30 — ABNORMAL HIGH
CO2: 25
Calcium: 9.4
Creatinine, Ser: 2.02 — ABNORMAL HIGH
Glucose, Bld: 148 — ABNORMAL HIGH
Phosphorus: 3.5
Sodium: 142

## 2011-06-20 LAB — POCT CARDIAC MARKERS
CKMB, poc: 2
CKMB, poc: 2.1
Myoglobin, poc: 174

## 2011-06-20 LAB — HEPARIN ANTIBODY SCREEN: Heparin Antibody Screen: NEGATIVE

## 2011-06-20 LAB — NA AND K (SODIUM & POTASSIUM), RAND UR: Sodium, Ur: 18

## 2011-06-20 LAB — URINE MICROSCOPIC-ADD ON

## 2011-06-20 LAB — HEPARIN LEVEL (UNFRACTIONATED): Heparin Unfractionated: 0.16 — ABNORMAL LOW

## 2011-06-20 LAB — PTH, INTACT AND CALCIUM: PTH: 73.7 — ABNORMAL HIGH

## 2011-06-23 ENCOUNTER — Telehealth: Payer: Self-pay | Admitting: *Deleted

## 2011-06-23 DIAGNOSIS — E119 Type 2 diabetes mellitus without complications: Secondary | ICD-10-CM

## 2011-06-23 MED ORDER — INSULIN LISPRO PROT & LISPRO (75-25 MIX) 100 UNIT/ML ~~LOC~~ SUSP: 20.0000 [IU] | Freq: Two times a day (BID) | SUBCUTANEOUS | Status: DC

## 2011-06-23 NOTE — Telephone Encounter (Signed)
Increase insulin 75/25 mix to 20 units subcutaneously twice daily with meals. Call on Thursday with updated CBG report.

## 2011-06-23 NOTE — Telephone Encounter (Signed)
Pt is calling stating he is getting FBSs as high as 280-300.  Per Dr. Cato Mulligan, he will review the chart, and we will call him back with med changes.

## 2011-06-23 NOTE — Telephone Encounter (Signed)
Pt.notified

## 2011-06-24 LAB — GLUCOSE, CAPILLARY: Glucose-Capillary: 131 — ABNORMAL HIGH

## 2011-06-26 LAB — GLUCOSE, CAPILLARY: Glucose-Capillary: 90 mg/dL (ref 70–99)

## 2011-06-30 ENCOUNTER — Other Ambulatory Visit: Payer: Self-pay | Admitting: *Deleted

## 2011-06-30 DIAGNOSIS — E119 Type 2 diabetes mellitus without complications: Secondary | ICD-10-CM

## 2011-07-02 ENCOUNTER — Ambulatory Visit (INDEPENDENT_AMBULATORY_CARE_PROVIDER_SITE_OTHER): Payer: Medicare Other | Admitting: Cardiology

## 2011-07-02 ENCOUNTER — Encounter: Payer: Self-pay | Admitting: Cardiology

## 2011-07-02 DIAGNOSIS — I4891 Unspecified atrial fibrillation: Secondary | ICD-10-CM

## 2011-07-02 DIAGNOSIS — I2510 Atherosclerotic heart disease of native coronary artery without angina pectoris: Secondary | ICD-10-CM

## 2011-07-02 DIAGNOSIS — I10 Essential (primary) hypertension: Secondary | ICD-10-CM

## 2011-07-02 DIAGNOSIS — I509 Heart failure, unspecified: Secondary | ICD-10-CM

## 2011-07-02 NOTE — Assessment & Plan Note (Signed)
Remains euvolemic.  ICD in place.  Following closely with renal.

## 2011-07-02 NOTE — Assessment & Plan Note (Signed)
Rate is adequately controlled.  V pacing.

## 2011-07-02 NOTE — Progress Notes (Signed)
HPI:  He is doing ok.  He had an episode where he was stung by a wasp.  Went to the ER, and was given an antibiotic and prednisone.  He then develop a sugar of 800, was admitted, and started on insulin.  Seeing Dr. Eliott Nine regularly at this point.  No chest pain or progressive shortness of breath.    Current Outpatient Prescriptions  Medication Sig Dispense Refill  . aspirin 81 MG tablet Take 81 mg by mouth daily.        . digoxin (LANOXIN) 0.125 MG tablet Take 125 mcg by mouth daily.        . furosemide (LASIX) 80 MG tablet Take 80 mg by mouth daily.        [name] glucose blood (FREESTYLE TEST STRIPS) test strip 1 each by Other route as needed. Use as instructed       . glyBURIDE (DIABETA) 1.25 MG tablet Take 1 tablet (1.25 mg total) by mouth 2 (two) times daily with a meal.  180 tablet  3  . hydrALAZINE (APRESOLINE) 25 MG tablet Take 1 tablet (25 mg total) by mouth 3 (three) times daily.  90 tablet  11  . insulin lispro protamine-insulin lispro (HUMALOG 75/25) (75-25) 100 UNIT/ML SUSP Inject 23 Units into the skin 2 (two) times daily with a meal.        . isosorbide dinitrate (ISORDIL) 30 MG tablet Take 15 mg by mouth daily.       . metoprolol (TOPROL-XL) 100 MG 24 hr tablet Take 100 mg by mouth 2 (two) times daily.       . nitroGLYCERIN (NITROSTAT) 0.4 MG SL tablet Place 1 tablet (0.4 mg total) under the tongue every 5 (five) minutes as needed.  50 tablet  3  . potassium chloride SA (K-DUR,KLOR-CON) 20 MEQ tablet Take 20 mEq by mouth 2 (two) times daily.        [name] warfarin (COUMADIN) 5 MG tablet Take 5 mg by mouth as directed. 5mg  Monday and Thursday, 2.5mg  all others        Allergies  Allergen Reactions  . Prednisone     Past Medical History  Diagnosis Date  . Atrial fibrillation 07/13/2006  . CARDIOMYOPATHY, PRIMARY, DILATED 08/07/2009  . COLON CANCER, HX OF 07/13/2006  . CONGESTIVE HEART FAILURE 07/13/2006  . CORONARY ARTERY DISEASE 07/13/2006  . PROSTATE CANCER, HX OF 07/13/2006  .  RENAL DISEASE, CHRONIC, STAGE III 04/18/2009  . SKIN CANCER, HX OF 04/18/2009  . SLEEP APNEA 11/01/2008  . Hypertension   . Hyperlipidemia   . Diabetes mellitus   . Hyperkalemia   . Arthritis     Past Surgical History  Procedure Date  . Colectomy   . Prostatectomy   . Penile prosthesis placement   . Removed prosthetic eye   . Ptca     stent placed  . Total knee arthroplasty   . St jude unify     Family History  Problem Relation Age of Onset  . Heart disease Father   . Throat cancer Mother     History   Social History  . Marital Status: Married    Spouse Name: N/A    Number of Children: N/A  . Years of Education: N/A   Occupational History  . Not on file.   Social History Main Topics  . Smoking status: Former Smoker    Quit date: 09/22/1966  . Smokeless tobacco: Not on file  . Alcohol Use: No  . Drug Use:  No  . Sexually Active:    Other Topics Concern  . Not on file   Social History Narrative  . No narrative on file    ROS: Please see the HPI.  All other systems reviewed and negative.  PHYSICAL EXAM:  BP 148/78  Pulse 70  Resp 18  Ht 6' (1.829 m)  Wt 195 lb 6.4 oz (88.633 kg)  BMI 26.50 kg/m2  General: Well developed, well nourished, in no acute distress. Head:  Normocephalic and atraumatic. Neck: no JVD Lungs: Clear to auscultation and percussion. Heart: Normal S1 and S2.  No murmur, rubs or gallops. Regular rhythm.  Abdomen:  Normal bowel sounds; soft; non tender; no organomegaly Pulses: Pulses normal in all 4 extremities. Extremities: No clubbing or cyanosis. No edema. Neurologic: Alert and oriented x 3.  EKG:  Atrial fibrillation.  V pacing.    ASSESSMENT AND PLAN:

## 2011-07-02 NOTE — Progress Notes (Signed)
   Patient ID: [name] [name]., male    DOB: 06-17-1931, 77 y.o.   MRN: [id number]  HPI    Review of Systems    Physical Exam

## 2011-07-02 NOTE — Patient Instructions (Signed)
Your physician wants you to follow-up in: 3 MONTHS.  You will receive a reminder letter in the mail two months in advance. If you don't receive a letter, please call our office to schedule the follow-up appointment.  Your physician recommends that you continue on your current medications as directed. Please refer to the Current Medication list given to you today.  

## 2011-07-02 NOTE — Assessment & Plan Note (Signed)
Reasonably good for him.

## 2011-07-02 NOTE — Assessment & Plan Note (Signed)
Following closely with Dr. Eliott Nine.  BMET for next week in her office.

## 2011-07-02 NOTE — Assessment & Plan Note (Signed)
No angina at present.  

## 2011-07-03 ENCOUNTER — Ambulatory Visit: Payer: Medicare Other | Admitting: Internal Medicine

## 2011-07-03 DIAGNOSIS — Z7901 Long term (current) use of anticoagulants: Secondary | ICD-10-CM

## 2011-07-03 DIAGNOSIS — Z5181 Encounter for therapeutic drug level monitoring: Secondary | ICD-10-CM

## 2011-07-03 DIAGNOSIS — I4891 Unspecified atrial fibrillation: Secondary | ICD-10-CM

## 2011-07-03 LAB — POCT INR: INR: 2.1

## 2011-07-03 NOTE — Patient Instructions (Signed)
  Latest dosing instructions   Total Sun Mon Tue Wed Thu Fri Sat   22.5 2.5 mg 5 mg 2.5 mg 2.5 mg 5 mg 2.5 mg 2.5 mg    (5 mg0.5) (5 mg1) (5 mg0.5) (5 mg0.5) (5 mg1) (5 mg0.5) (5 mg0.5)        

## 2011-07-09 ENCOUNTER — Other Ambulatory Visit: Payer: Self-pay | Admitting: Internal Medicine

## 2011-07-31 ENCOUNTER — Ambulatory Visit (INDEPENDENT_AMBULATORY_CARE_PROVIDER_SITE_OTHER): Payer: Medicare Other | Admitting: Internal Medicine

## 2011-07-31 DIAGNOSIS — I4891 Unspecified atrial fibrillation: Secondary | ICD-10-CM

## 2011-07-31 LAB — POCT INR: INR: 1.7

## 2011-07-31 NOTE — Patient Instructions (Signed)
  Latest dosing instructions   Total Sun Mon Tue Wed Thu Fri Sat   22.5 2.5 mg 5 mg 2.5 mg 2.5 mg 5 mg 2.5 mg 2.5 mg    (5 mg0.5) (5 mg1) (5 mg0.5) (5 mg0.5) (5 mg1) (5 mg0.5) (5 mg0.5)        

## 2011-08-05 ENCOUNTER — Other Ambulatory Visit: Payer: Self-pay | Admitting: Dermatology

## 2011-08-19 ENCOUNTER — Encounter: Payer: Medicare Other | Admitting: Internal Medicine

## 2011-08-26 ENCOUNTER — Encounter: Payer: Self-pay | Admitting: Internal Medicine

## 2011-08-26 ENCOUNTER — Ambulatory Visit (INDEPENDENT_AMBULATORY_CARE_PROVIDER_SITE_OTHER): Payer: Medicare Other | Admitting: Internal Medicine

## 2011-08-26 VITALS — BP 136/82 | HR 72 | Temp 98.2°F | Ht 72.0 in | Wt 200.0 lb

## 2011-08-26 DIAGNOSIS — I10 Essential (primary) hypertension: Secondary | ICD-10-CM

## 2011-08-26 DIAGNOSIS — I4891 Unspecified atrial fibrillation: Secondary | ICD-10-CM

## 2011-08-26 DIAGNOSIS — N184 Chronic kidney disease, stage 4 (severe): Secondary | ICD-10-CM

## 2011-08-26 DIAGNOSIS — E119 Type 2 diabetes mellitus without complications: Secondary | ICD-10-CM

## 2011-08-26 DIAGNOSIS — E785 Hyperlipidemia, unspecified: Secondary | ICD-10-CM

## 2011-08-26 LAB — HEPATIC FUNCTION PANEL
Alkaline Phosphatase: 37 U/L — ABNORMAL LOW (ref 39–117)
Bilirubin, Direct: 0.1 mg/dL (ref 0.0–0.3)
Total Bilirubin: 0.7 mg/dL (ref 0.3–1.2)
Total Protein: 6.9 g/dL (ref 6.0–8.3)

## 2011-08-26 LAB — HEMOGLOBIN A1C: Hgb A1c MFr Bld: 8.7 % — ABNORMAL HIGH (ref 4.6–6.5)

## 2011-08-26 LAB — LIPID PANEL: VLDL: 68.2 mg/dL — ABNORMAL HIGH (ref 0.0–40.0)

## 2011-08-26 LAB — LDL CHOLESTEROL, DIRECT: Direct LDL: 116.3 mg/dL

## 2011-08-26 NOTE — Patient Instructions (Signed)
  Latest dosing instructions   Total Sun Mon Tue Wed Thu Fri Sat   25 2.5 mg 5 mg 2.5 mg 5 mg 2.5 mg 5 mg 2.5 mg    (5 mg0.5) (5 mg1) (5 mg0.5) (5 mg1) (5 mg0.5) (5 mg1) (5 mg0.5)        

## 2011-08-26 NOTE — Progress Notes (Signed)
Patient ID: [name] Jawad., male   DOB: 1931/02/08, 77 y.o.   MRN: [id number]  patient comes in for followup of multiple medical problems including type 2 diabetes, hyperlipidemia, hypertension. The patient does not check blood sugar or blood pressure at home. The patetient does not follow an exercise or diet program. The patient denies any polyuria, polydipsia.  In the past the patient has gone to diabetic treatment center. The patient is tolerating medications  Without difficulty. The patient does admit to medication compliance.  Past Medical History  Diagnosis Date  . Atrial fibrillation 07/13/2006  . CARDIOMYOPATHY, PRIMARY, DILATED 08/07/2009  . COLON CANCER, HX OF 07/13/2006  . CONGESTIVE HEART FAILURE 07/13/2006  . CORONARY ARTERY DISEASE 07/13/2006  . PROSTATE CANCER, HX OF 07/13/2006  . RENAL DISEASE, CHRONIC, STAGE III 04/18/2009  . SKIN CANCER, HX OF 04/18/2009  . SLEEP APNEA 11/01/2008  . Hypertension   . Hyperlipidemia   . Diabetes mellitus   . Hyperkalemia   . Arthritis     History   Social History  . Marital Status: Married    Spouse Name: N/A    Number of Children: N/A  . Years of Education: N/A   Occupational History  . Not on file.   Social History Main Topics  . Smoking status: Former Smoker    Quit date: 09/22/1966  . Smokeless tobacco: Not on file  . Alcohol Use: No  . Drug Use: No  . Sexually Active:    Other Topics Concern  . Not on file   Social History Narrative  . No narrative on file    Past Surgical History  Procedure Date  . Colectomy   . Prostatectomy   . Penile prosthesis placement   . Removed prosthetic eye   . Ptca     stent placed  . Total knee arthroplasty   . St jude unify     Family History  Problem Relation Age of Onset  . Heart disease Father   . Throat cancer Mother     Allergies  Allergen Reactions  . Prednisone     Current Outpatient Prescriptions on File Prior to Visit  Medication Sig Dispense Refill  .  aspirin 81 MG tablet Take 81 mg by mouth daily.        . furosemide (LASIX) 80 MG tablet Take 80 mg by mouth daily.        [name] glucose blood (FREESTYLE TEST STRIPS) test strip 1 each by Other route as needed. Use as instructed       . glyBURIDE (DIABETA) 1.25 MG tablet Take 1 tablet (1.25 mg total) by mouth 2 (two) times daily with a meal.  180 tablet  3  . hydrALAZINE (APRESOLINE) 25 MG tablet Take 1 tablet (25 mg total) by mouth 3 (three) times daily.  90 tablet  11  . insulin lispro protamine-insulin lispro (HUMALOG 75/25) (75-25) 100 UNIT/ML SUSP Inject 23 Units into the skin 2 (two) times daily with a meal.        . isosorbide dinitrate (ISORDIL) 30 MG tablet Take 15 mg by mouth daily.       . metoprolol (TOPROL-XL) 100 MG 24 hr tablet Take 100 mg by mouth 2 (two) times daily.       . nitroGLYCERIN (NITROSTAT) 0.4 MG SL tablet Place 1 tablet (0.4 mg total) under the tongue every 5 (five) minutes as needed.  50 tablet  3  . potassium chloride SA (K-DUR,KLOR-CON) 20 MEQ tablet Take 20  mEq by mouth 2 (two) times daily.        [name] warfarin (COUMADIN) 5 MG tablet Take 5 mg by mouth as directed. 5mg  Monday and Thursday, 2.5mg  all others         patient denies chest pain, shortness of breath, orthopnea. Denies lower extremity edema, abdominal pain, change in appetite, change in bowel movements. Patient denies rashes, musculoskeletal complaints. No other specific complaints in a complete review of systems.   BP 136/82  Pulse 72  Temp(Src) 98.2 F (36.8 C) (Oral)  Ht 6' (1.829 m)  Wt 200 lb (90.719 kg)  BMI 27.12 kg/m2  well-developed well-nourished male in no acute distress. HEENT exam atraumatic, normocephalic, neck supple without jugular venous distention. Chest clear to auscultation cardiac exam S1-S2 are irregular. Abdominal exam overweight with bowel sounds, soft and nontender. Extremities no edema. Neurologic exam is alert with a normal gait.

## 2011-08-26 NOTE — Assessment & Plan Note (Signed)
Check labs today States he is gaining weight and CBGs ranging in the 150-200 range

## 2011-08-28 ENCOUNTER — Ambulatory Visit: Payer: Medicare Other

## 2011-08-28 NOTE — Assessment & Plan Note (Signed)
BP Readings from Last 3 Encounters:  08/26/11 136/82  07/02/11 148/78  05/27/11 172/90   Adequate control. Continue current medications.

## 2011-08-28 NOTE — Assessment & Plan Note (Signed)
Rate controlled. Patient on warfarin. No complications with warfarin.

## 2011-08-29 ENCOUNTER — Ambulatory Visit: Payer: Medicare Other

## 2011-09-02 ENCOUNTER — Encounter: Payer: Medicare Other | Admitting: Internal Medicine

## 2011-09-04 ENCOUNTER — Encounter: Payer: Self-pay | Admitting: Internal Medicine

## 2011-09-04 ENCOUNTER — Ambulatory Visit (INDEPENDENT_AMBULATORY_CARE_PROVIDER_SITE_OTHER): Payer: Medicare Other | Admitting: Internal Medicine

## 2011-09-04 DIAGNOSIS — Z95810 Presence of automatic (implantable) cardiac defibrillator: Secondary | ICD-10-CM

## 2011-09-04 DIAGNOSIS — I428 Other cardiomyopathies: Secondary | ICD-10-CM

## 2011-09-04 DIAGNOSIS — I4891 Unspecified atrial fibrillation: Secondary | ICD-10-CM

## 2011-09-04 DIAGNOSIS — I509 Heart failure, unspecified: Secondary | ICD-10-CM

## 2011-09-04 DIAGNOSIS — Z79899 Other long term (current) drug therapy: Secondary | ICD-10-CM

## 2011-09-04 LAB — ICD DEVICE OBSERVATION
ATRIAL PACING ICD: 0 pct
DEVICE MODEL ICD: 595859
FVT: 0
LV LEAD IMPEDENCE ICD: 700 Ohm
LV LEAD THRESHOLD: 0.625 V
MODE SWITCH EPISODES: 0
PACEART VT: 0
RV LEAD THRESHOLD: 0.75 V
TZAT-0001SLOWVT: 1
TZAT-0004SLOWVT: 8
TZAT-0012SLOWVT: 200 ms
TZAT-0013SLOWVT: 3
TZAT-0019SLOWVT: 7.5 V
TZAT-0020SLOWVT: 1 ms
TZON-0003SLOWVT: 300 ms
TZON-0004SLOWVT: 40
TZON-0005SLOWVT: 6
TZST-0001SLOWVT: 3
TZST-0001SLOWVT: 4
TZST-0003SLOWVT: 30 J
TZST-0003SLOWVT: 40 J
VENTRICULAR PACING ICD: 71 pct
VF: 0

## 2011-09-04 NOTE — Patient Instructions (Signed)
Your physician recommends that you schedule a follow-up appointment in: 3 MONTHS WITH KRISTIN OR PAULA  AND YEAR WITH DR Graciela Husbands Your physician recommends that you continue on your current medications as directed. Please refer to the Current Medication list given to you today. Your physician recommends that you return for lab work in: TODAY DIG  LEVEL DX  V58.69

## 2011-09-04 NOTE — Assessment & Plan Note (Addendum)
The patient's device was interrogated.  The information was reviewed. No changes were made in the programming.    He is only 71% by V. Paced; however, in the axis of more significant symptoms and the lack of data driving u sforward, I would not undertake AV junction ablation at this time as his heart rate is not too bad.

## 2011-09-04 NOTE — Progress Notes (Signed)
Addended by: Alma Friendly on: 09/04/2011 11:25 AM   Modules accepted: Orders

## 2011-09-04 NOTE — Assessment & Plan Note (Signed)
As above.

## 2011-09-04 NOTE — Assessment & Plan Note (Signed)
Continue current meds 

## 2011-09-04 NOTE — Assessment & Plan Note (Signed)
Clinically stable. He is on digoxin. His renal insufficiency. We will measure today.

## 2011-09-04 NOTE — Progress Notes (Signed)
HPI  [name] [name]. is a 77 y.o. male  seen in followup for congestive heart failure in the setting of atrial fibrillation with a rapid ventricular response and associated cardiomyopathy with both ischemic and nonischemic components. He is status post CRT-D implantation with Anticipation of AV junction ablation. However, we were then able to accomplish  rate control And ablation was not undertaken. Cardiac catheterization in OCT 2010   Nonischemic cardiomyopathy with reduced cardiac output   2. Continued patency of the left anterior descending artery at site of previous stenting  3. Scattered noncritical disease ; left ventricular function out of proportion to the extent of coronary disease.  Echo 2011 demonstrated EF 30-35%  His functional status is quite good. He is swimming 30 minutes and riding a bike 10 minutes every day. He has recently seen a nephrologist. Last creatinine we have in the computer is 2.2  3 months ago. I do not see digoxin level.  Past Medical History  Diagnosis Date  . Atrial fibrillation 07/13/2006  . CARDIOMYOPATHY, PRIMARY, DILATED 08/07/2009  . COLON CANCER, HX OF 07/13/2006  . CONGESTIVE HEART FAILURE 07/13/2006  . CORONARY ARTERY DISEASE 07/13/2006  . PROSTATE CANCER, HX OF 07/13/2006  . RENAL DISEASE, CHRONIC, STAGE III 04/18/2009  . SKIN CANCER, HX OF 04/18/2009  . SLEEP APNEA 11/01/2008  . Hypertension   . Hyperlipidemia   . Diabetes mellitus   . Hyperkalemia   . Arthritis     Past Surgical History  Procedure Date  . Colectomy   . Prostatectomy   . Penile prosthesis placement   . Removed prosthetic eye   . Ptca     stent placed  . Total knee arthroplasty   . St jude unify     Current Outpatient Prescriptions  Medication Sig Dispense Refill  . aspirin 81 MG tablet Take 81 mg by mouth daily.        . digoxin (LANOXIN) 0.125 MG tablet Take 125 mcg by mouth daily.        . furosemide (LASIX) 80 MG tablet Take 80 mg by mouth daily.        [name]  glucose blood (FREESTYLE TEST STRIPS) test strip 1 each by Other route as needed. Use as instructed       . glyBURIDE (DIABETA) 1.25 MG tablet Take 1 tablet (1.25 mg total) by mouth 2 (two) times daily with a meal.  180 tablet  3  . hydrALAZINE (APRESOLINE) 25 MG tablet Take 1 tablet (25 mg total) by mouth 3 (three) times daily.  90 tablet  11  . insulin lispro protamine-insulin lispro (HUMALOG 75/25) (75-25) 100 UNIT/ML SUSP Inject 26 Units into the skin 2 (two) times daily with a meal.       . isosorbide dinitrate (ISORDIL) 30 MG tablet Take 15 mg by mouth daily.       . metoprolol (TOPROL-XL) 100 MG 24 hr tablet Take 100 mg by mouth 2 (two) times daily.       . nitroGLYCERIN (NITROSTAT) 0.4 MG SL tablet Place 1 tablet (0.4 mg total) under the tongue every 5 (five) minutes as needed.  50 tablet  3  . potassium chloride SA (K-DUR,KLOR-CON) 20 MEQ tablet Take 20 mEq by mouth 2 (two) times daily.        . Vitamin D, Ergocalciferol, (DRISDOL) 50000 UNITS CAPS Take 50,000 Units by mouth 2 (two) times a week.        . warfarin (COUMADIN) 5 MG tablet Take 5  mg by mouth as directed. 5mg  Monday and Thursday, 2.5mg  all others        Allergies  Allergen Reactions  . Prednisone     Review of Systems negative except from HPI and PMH  Physical Exam Well developed and well nourished in no acute distress HENT normal E scleral and icterus clear Neck Supple JVP flat; carotids brisk and full Clear to ausculation irregularly irregular rate and rhythm, no murmurs gallops or rub Soft with active bowel sounds No clubbing cyanosis none Edema Alert and oriented, grossly normal motor and sensory function Skin Warm and Dry   Assessment and  Plan

## 2011-09-04 NOTE — Assessment & Plan Note (Signed)
He has atrial fibrillation permanently. He takes warfarin. I wonder whether he is a candidate to come off of aspirin at this time. I will defer this to Dr. Riley Kill

## 2011-09-05 LAB — DIGOXIN LEVEL: Digoxin Level: 1.2 ng/mL (ref 0.8–2.0)

## 2011-09-09 ENCOUNTER — Telehealth: Payer: Self-pay | Admitting: Internal Medicine

## 2011-09-09 NOTE — Telephone Encounter (Signed)
Fu call Pt was returning a call from yesterday

## 2011-09-09 NOTE — Telephone Encounter (Signed)
[name] [name] wants to talk to Dr. Riley Kill regarding his Dig level.

## 2011-09-10 NOTE — Telephone Encounter (Signed)
I spoke with the pt and made him aware of his digoxin level.  Dr Riley Kill said that his level was okay and continue current dose.  The pt said that he had taken his digoxin about one hour prior to having the lab drawn.

## 2011-09-11 ENCOUNTER — Ambulatory Visit (INDEPENDENT_AMBULATORY_CARE_PROVIDER_SITE_OTHER): Payer: Medicare Other | Admitting: Internal Medicine

## 2011-09-11 DIAGNOSIS — I4891 Unspecified atrial fibrillation: Secondary | ICD-10-CM

## 2011-09-11 LAB — POCT INR: INR: 1.9

## 2011-09-11 NOTE — Patient Instructions (Signed)
  Latest dosing instructions   Total Sun Mon Tue Wed Thu Fri Sat   27.5 2.5 mg 5 mg 2.5 mg 5 mg 2.5 mg 5 mg 5 mg    (5 mg0.5) (5 mg1) (5 mg0.5) (5 mg1) (5 mg0.5) (5 mg1) (5 mg1)        

## 2011-09-24 ENCOUNTER — Telehealth: Payer: Self-pay | Admitting: Internal Medicine

## 2011-09-24 NOTE — Telephone Encounter (Signed)
Chart opened in error

## 2011-09-25 ENCOUNTER — Ambulatory Visit (INDEPENDENT_AMBULATORY_CARE_PROVIDER_SITE_OTHER): Payer: Medicare Other

## 2011-09-25 DIAGNOSIS — I4891 Unspecified atrial fibrillation: Secondary | ICD-10-CM

## 2011-09-25 DIAGNOSIS — Z7901 Long term (current) use of anticoagulants: Secondary | ICD-10-CM

## 2011-09-25 NOTE — Patient Instructions (Signed)
  Latest dosing instructions   Total Sun Mon Tue Wed Thu Fri Sat   27.5 2.5 mg 5 mg 2.5 mg 5 mg 2.5 mg 5 mg 5 mg    (5 mg0.5) (5 mg1) (5 mg0.5) (5 mg1) (5 mg0.5) (5 mg1) (5 mg1)

## 2011-09-29 ENCOUNTER — Telehealth: Payer: Self-pay | Admitting: Internal Medicine

## 2011-09-29 DIAGNOSIS — I4891 Unspecified atrial fibrillation: Secondary | ICD-10-CM

## 2011-09-29 MED ORDER — METOPROLOL SUCCINATE ER 100 MG PO TB24: 100.0000 mg | ORAL_TABLET | Freq: Two times a day (BID) | ORAL | Status: DC

## 2011-09-29 MED ORDER — DIGOXIN 125 MCG PO TABS: 125.0000 ug | ORAL_TABLET | Freq: Every day | ORAL | Status: DC

## 2011-09-29 MED ORDER — WARFARIN SODIUM 5 MG PO TABS: 5.0000 mg | ORAL_TABLET | ORAL | Status: DC

## 2011-09-29 MED ORDER — GLYBURIDE 1.25 MG PO TABS: 1.2500 mg | ORAL_TABLET | Freq: Two times a day (BID) | ORAL | Status: DC

## 2011-09-29 MED ORDER — HYDRALAZINE HCL 25 MG PO TABS: 25.0000 mg | ORAL_TABLET | Freq: Three times a day (TID) | ORAL | Status: DC

## 2011-09-29 MED ORDER — ISOSORBIDE DINITRATE 30 MG PO TABS: 15.0000 mg | ORAL_TABLET | Freq: Every day | ORAL | Status: DC

## 2011-09-29 MED ORDER — POTASSIUM CHLORIDE CRYS ER 20 MEQ PO TBCR: 20.0000 meq | EXTENDED_RELEASE_TABLET | Freq: Two times a day (BID) | ORAL | Status: DC

## 2011-09-29 MED ORDER — INSULIN LISPRO PROT & LISPRO (75-25 MIX) 100 UNIT/ML ~~LOC~~ SUSP: 26.0000 [IU] | Freq: Two times a day (BID) | SUBCUTANEOUS | Status: DC

## 2011-09-29 MED ORDER — FUROSEMIDE 80 MG PO TABS: 80.0000 mg | ORAL_TABLET | Freq: Every day | ORAL | Status: DC

## 2011-09-29 NOTE — Telephone Encounter (Signed)
Opened in error

## 2011-10-01 ENCOUNTER — Ambulatory Visit (INDEPENDENT_AMBULATORY_CARE_PROVIDER_SITE_OTHER): Payer: Medicare Other | Admitting: Cardiology

## 2011-10-01 ENCOUNTER — Encounter: Payer: Self-pay | Admitting: Cardiology

## 2011-10-01 VITALS — BP 148/82 | HR 77 | Ht 72.0 in | Wt 200.0 lb

## 2011-10-01 DIAGNOSIS — I4891 Unspecified atrial fibrillation: Secondary | ICD-10-CM

## 2011-10-01 DIAGNOSIS — E785 Hyperlipidemia, unspecified: Secondary | ICD-10-CM

## 2011-10-01 DIAGNOSIS — I2510 Atherosclerotic heart disease of native coronary artery without angina pectoris: Secondary | ICD-10-CM

## 2011-10-01 DIAGNOSIS — I428 Other cardiomyopathies: Secondary | ICD-10-CM

## 2011-10-01 NOTE — Patient Instructions (Signed)
Your physician recommends that you continue on your current medications as directed. Please refer to the Current Medication list given to you today.  Your physician wants you to follow-up in: 3 MONTHS. You will receive a reminder letter in the mail two months in advance. If you don't receive a letter, please call our office to schedule the follow-up appointment.  

## 2011-10-04 NOTE — Progress Notes (Signed)
HPI:  [name] [name] is in for follow up.  He is doing well.  He swims about thirty minutes every day.  He feels about as good as he has at any time.  His glucoses have not been all that great.  He denies any chest pain.  He is being followed also by Dr Eliott Nine, and is getting his labs done there.   Current Outpatient Prescriptions  Medication Sig Dispense Refill  . aspirin 81 MG tablet Take 81 mg by mouth daily.        . digoxin (LANOXIN) 0.125 MG tablet Take 1 tablet (125 mcg total) by mouth daily.  90 tablet  3  . furosemide (LASIX) 80 MG tablet Take 1 tablet (80 mg total) by mouth daily.  90 tablet  3  . glucose blood (FREESTYLE TEST STRIPS) test strip 1 each by Other route as needed. Use as instructed       . glyBURIDE (DIABETA) 1.25 MG tablet Take 1 tablet (1.25 mg total) by mouth 2 (two) times daily with a meal.  180 tablet  3  . hydrALAZINE (APRESOLINE) 25 MG tablet Take 1 tablet (25 mg total) by mouth 3 (three) times daily.  270 tablet  3  . insulin lispro protamine-insulin lispro (HUMALOG 75/25) (75-25) 100 UNIT/ML SUSP Inject 26 Units into the skin 2 (two) times daily with a meal.  30 mL  3  . isosorbide dinitrate (ISORDIL) 30 MG tablet Take 0.5 tablets (15 mg total) by mouth daily.  45 tablet  3  . metoprolol (TOPROL-XL) 100 MG 24 hr tablet Take 1 tablet (100 mg total) by mouth 2 (two) times daily.  180 tablet  3  . nitroGLYCERIN (NITROSTAT) 0.4 MG SL tablet Place 1 tablet (0.4 mg total) under the tongue every 5 (five) minutes as needed.  50 tablet  3  . potassium chloride SA (K-DUR,KLOR-CON) 20 MEQ tablet Take 1 tablet (20 mEq total) by mouth 2 (two) times daily.  180 tablet  3  . Vitamin D, Ergocalciferol, (DRISDOL) 50000 UNITS CAPS Take 50,000 Units by mouth 2 (two) times a week.        . warfarin (COUMADIN) 5 MG tablet Take 1 tablet (5 mg total) by mouth as directed. 5mg  Monday and Thursday, 2.5mg  all others  90 tablet  3    Allergies  Allergen Reactions  . Prednisone     Past  Medical History  Diagnosis Date  . Atrial fibrillation 07/13/2006  . CARDIOMYOPATHY, PRIMARY, DILATED 08/07/2009  . COLON CANCER, HX OF 07/13/2006  . CONGESTIVE HEART FAILURE 07/13/2006  . CORONARY ARTERY DISEASE 07/13/2006  . PROSTATE CANCER, HX OF 07/13/2006  . RENAL DISEASE, CHRONIC, STAGE III 04/18/2009  . SKIN CANCER, HX OF 04/18/2009  . SLEEP APNEA 11/01/2008  . Hypertension   . Hyperlipidemia   . Diabetes mellitus   . Hyperkalemia   . Arthritis     Past Surgical History  Procedure Date  . Colectomy   . Prostatectomy   . Penile prosthesis placement   . Removed prosthetic eye   . Ptca     stent placed  . Total knee arthroplasty   . St jude unify     Family History  Problem Relation Age of Onset  . Heart disease Father   . Throat cancer Mother     History   Social History  . Marital Status: Married    Spouse Name: N/A    Number of Children: N/A  . Years of Education:  N/A   Occupational History  . Not on file.   Social History Main Topics  . Smoking status: Former Smoker    Quit date: 09/22/1966  . Smokeless tobacco: Not on file  . Alcohol Use: No  . Drug Use: No  . Sexually Active:    Other Topics Concern  . Not on file   Social History Narrative  . No narrative on file    ROS: Please see the HPI.  All other systems reviewed and negative.  PHYSICAL EXAM:  BP 148/82  Pulse 77  Ht 6' (1.829 m)  Wt 90.719 kg (200 lb)  BMI 27.12 kg/m2  General: Well developed, well nourished, in no acute distress. Head:  Normocephalic and atraumatic. Neck: no JVD Lungs: Clear to auscultation and percussion. Heart:irregularly irregular rhythm.  Minimal apical murmur.   Abdomen:  Normal bowel sounds; soft; non tender; no organomegaly Pulses: Pulses normal in all 4 extremities. Extremities: No clubbing or cyanosis. No edema. Neurologic: Alert and oriented x 3.  EKG:  Atrial fib with controlled ventricular response.  Difficult to interpret due to back up  ventricular demand pacing.  PVC.  Delay R wave progression consistent with prior ME indeterminate age.    ASSESSMENT AND PLAN:

## 2011-10-06 ENCOUNTER — Other Ambulatory Visit: Payer: Self-pay | Admitting: *Deleted

## 2011-10-06 DIAGNOSIS — I4891 Unspecified atrial fibrillation: Secondary | ICD-10-CM

## 2011-10-06 MED ORDER — INSULIN LISPRO PROT & LISPRO (75-25 MIX) 100 UNIT/ML ~~LOC~~ SUSP: 26.0000 [IU] | Freq: Two times a day (BID) | SUBCUTANEOUS | Status: DC

## 2011-10-06 MED ORDER — HYDRALAZINE HCL 25 MG PO TABS: 25.0000 mg | ORAL_TABLET | Freq: Three times a day (TID) | ORAL | Status: DC

## 2011-10-06 MED ORDER — DIGOXIN 125 MCG PO TABS: 125.0000 ug | ORAL_TABLET | Freq: Every day | ORAL | Status: DC

## 2011-10-06 MED ORDER — ISOSORBIDE DINITRATE 30 MG PO TABS: 15.0000 mg | ORAL_TABLET | Freq: Every day | ORAL | Status: DC

## 2011-10-06 MED ORDER — FUROSEMIDE 80 MG PO TABS: 80.0000 mg | ORAL_TABLET | Freq: Every day | ORAL | Status: DC

## 2011-10-06 MED ORDER — POTASSIUM CHLORIDE CRYS ER 20 MEQ PO TBCR: 20.0000 meq | EXTENDED_RELEASE_TABLET | Freq: Two times a day (BID) | ORAL | Status: DC

## 2011-10-06 MED ORDER — WARFARIN SODIUM 5 MG PO TABS: 5.0000 mg | ORAL_TABLET | ORAL | Status: DC

## 2011-10-06 MED ORDER — GLYBURIDE 1.25 MG PO TABS: 1.2500 mg | ORAL_TABLET | Freq: Two times a day (BID) | ORAL | Status: DC

## 2011-10-06 MED ORDER — METOPROLOL SUCCINATE ER 100 MG PO TB24: 100.0000 mg | ORAL_TABLET | Freq: Two times a day (BID) | ORAL | Status: DC

## 2011-10-06 NOTE — Telephone Encounter (Signed)
Pt having issues with mail order pharmacy and would like 1 mths refill sent in to [facility].  Rx's sent electronically, pt aware

## 2011-10-07 ENCOUNTER — Other Ambulatory Visit: Payer: Self-pay | Admitting: Internal Medicine

## 2011-10-07 ENCOUNTER — Other Ambulatory Visit: Payer: Self-pay | Admitting: Cardiology

## 2011-10-07 NOTE — Telephone Encounter (Signed)
.   Requested Prescriptions   Pending Prescriptions Disp Refills  . metoprolol succinate (TOPROL-XL) 100 MG 24 hr tablet [Pharmacy Med Name: METOPROLOL 100MG  ER TAB WATS] 180 tablet 4    Sig: TAKE ONE TABLET BY MOUTH TWICE DAILY

## 2011-10-09 ENCOUNTER — Other Ambulatory Visit: Payer: Self-pay | Admitting: *Deleted

## 2011-10-09 ENCOUNTER — Telehealth: Payer: Self-pay | Admitting: Internal Medicine

## 2011-10-09 MED ORDER — ISOSORBIDE DINITRATE 30 MG PO TABS: 15.0000 mg | ORAL_TABLET | Freq: Every day | ORAL | Status: DC

## 2011-10-09 NOTE — Telephone Encounter (Signed)
[facility] pharmacy needs clarification on isosorbide dinitrate (ISORDIL) 30 MG tablet. Pharmacist said that the pt usually has rcvd a script for this med in an Extended Release form Monohydrate. Which med is correct?

## 2011-10-10 NOTE — Telephone Encounter (Signed)
It looks like Dr Riley Kill rx'd this but Pharmacy states it has Dr Cato Mulligan on rx.  Could you confirm?

## 2011-10-10 NOTE — Assessment & Plan Note (Signed)
Currently well controlled.  Dig level was 1.2, but obtained early in day.  Notably, his renal function is being watched, and I will continue to monitor this carefully with follow up.

## 2011-10-10 NOTE — Telephone Encounter (Signed)
[facility] pharmacy called to check on status of getting clarification on isosorbide dinitrate (ISORDIL) 30 MG tablet. Pharmacist said that the pt usually has rcvd a script for this med in an Extended Release form Monohydrate. Which med is correct? Pharmacist said that they need to have answer to this before end of day today.

## 2011-10-10 NOTE — Assessment & Plan Note (Addendum)
Functional status at the present time appears to be excellent. Meds are where they need to be given limitation of renal function. If BP remains elevated, would be reasonable to increase isosorbide as he is on hydralazine/nitrates.

## 2011-10-10 NOTE — Assessment & Plan Note (Signed)
No recurrent angina at present.  Continue to monitor.  Prior stent to the LAD, no recurrent symptoms.

## 2011-10-10 NOTE — Assessment & Plan Note (Signed)
Will defer to Dr. Cato Mulligan.

## 2011-10-12 NOTE — Telephone Encounter (Signed)
The only prescription we have ever given that i can see is isosorbide dinitrate 30 mg , 1/2 tablet daily

## 2011-10-13 ENCOUNTER — Telehealth: Payer: Self-pay | Admitting: Cardiology

## 2011-10-13 MED ORDER — ISOSORBIDE MONONITRATE ER 30 MG PO TB24: ORAL_TABLET | ORAL | Status: DC

## 2011-10-13 NOTE — Telephone Encounter (Signed)
PT CALLED RE REFILL ON ISOSORBIDE  30 MG  1/2 TAB IN PAST  WAS ON MONO ( FROM  10/11/10 OFFICE NOTE ) ON RECENT LIST WAS LISTED  AS ISORDIL  INFORMED PT  ISORDIL IS GEN FORM WILL FILL MED  WITH MONO AND NOTE MAYBE GIVEN GEN   REFILLS   MED SENT VIA  EPIC TO [facility] PER PT'S REQUEST .Zack Seal

## 2011-10-13 NOTE — Telephone Encounter (Signed)
Told pt to call Dr Rosalyn Charters office to verify dose.  Dr Rosalyn Charters name is rx

## 2011-10-13 NOTE — Telephone Encounter (Signed)
New Problem:     Patient called because there is a discrepancy with his isosorbide dinitrate (ISORDIL) 30 MG tablet.  He is supposed to be taking isosorbide mononitrate 30MG  tablet. Patient is concerned about what could have happened if this mistake was not caught.  Please call back to rectify patient's prescription issue.

## 2011-10-14 ENCOUNTER — Telehealth: Payer: Self-pay | Admitting: Cardiology

## 2011-10-14 MED ORDER — ISOSORBIDE MONONITRATE ER 30 MG PO TB24: 15.0000 mg | ORAL_TABLET | Freq: Every day | ORAL | Status: DC

## 2011-10-14 NOTE — Telephone Encounter (Signed)
I spoke with the pt and made him aware that isosorbide dinitrate was listed in his chart. I cannot find when this medication was entered into the med list.  The pt has not been taking this medication, the pt has been taking Isosorbide MN 30mg  one-half tablet daily.  Medication list has been corrected and new Rx has been sent to pharmacy per pt's request.

## 2011-10-14 NOTE — Telephone Encounter (Signed)
FU Call: Pt calling back to speak to Lauren regarding incorrect medication information on form for pt RX refill request. Please return pt call to discuss further.

## 2011-10-14 NOTE — Telephone Encounter (Signed)
Left message to call back  

## 2011-10-14 NOTE — Telephone Encounter (Signed)
Pt rtn lauren's call °

## 2011-10-16 ENCOUNTER — Telehealth: Payer: Self-pay | Admitting: Internal Medicine

## 2011-10-16 NOTE — Telephone Encounter (Signed)
Told pharmacy to call Dr Rosalyn Charters office.  Dr Cato Mulligan did not change medicine

## 2011-10-16 NOTE — Telephone Encounter (Signed)
They called today with an issue with his Isosrbide. They want Arline Asp to return call. Thanks.

## 2011-10-29 ENCOUNTER — Ambulatory Visit (INDEPENDENT_AMBULATORY_CARE_PROVIDER_SITE_OTHER): Payer: Medicare Other | Admitting: Internal Medicine

## 2011-10-29 DIAGNOSIS — I4891 Unspecified atrial fibrillation: Secondary | ICD-10-CM

## 2011-10-29 LAB — POCT INR: INR: 2.8

## 2011-10-29 NOTE — Patient Instructions (Signed)
  Latest dosing instructions   Total Sun Mon Tue Wed Thu Fri Sat   27.5 2.5 mg 5 mg 2.5 mg 5 mg 2.5 mg 5 mg 5 mg    (5 mg0.5) (5 mg1) (5 mg0.5) (5 mg1) (5 mg0.5) (5 mg1) (5 mg1)        

## 2011-11-06 ENCOUNTER — Telehealth: Payer: Self-pay | Admitting: Internal Medicine

## 2011-11-06 NOTE — Telephone Encounter (Signed)
[facility] rcvd form back for pts testing supplies from Dr Cato Mulligan, but form was incomplete. Form is being refaxed back to Dr Cato Mulligan for completion.

## 2011-11-11 ENCOUNTER — Telehealth: Payer: Self-pay | Admitting: *Deleted

## 2011-11-11 NOTE — Telephone Encounter (Signed)
Section1, Section 3 and the date not completed on form

## 2011-11-12 NOTE — Telephone Encounter (Signed)
Never received 

## 2011-11-14 NOTE — Telephone Encounter (Signed)
Disregard spoke with pt for clarification

## 2011-11-24 ENCOUNTER — Other Ambulatory Visit: Payer: Self-pay | Admitting: Internal Medicine

## 2011-11-25 ENCOUNTER — Ambulatory Visit: Payer: Medicare Other | Admitting: Internal Medicine

## 2011-11-25 ENCOUNTER — Encounter: Payer: Self-pay | Admitting: Internal Medicine

## 2011-11-25 ENCOUNTER — Ambulatory Visit (INDEPENDENT_AMBULATORY_CARE_PROVIDER_SITE_OTHER): Payer: Medicare Other | Admitting: Internal Medicine

## 2011-11-25 VITALS — BP 142/82 | HR 84 | Temp 98.1°F | Ht 72.0 in | Wt 199.0 lb

## 2011-11-25 DIAGNOSIS — I10 Essential (primary) hypertension: Secondary | ICD-10-CM

## 2011-11-25 DIAGNOSIS — I2510 Atherosclerotic heart disease of native coronary artery without angina pectoris: Secondary | ICD-10-CM

## 2011-11-25 DIAGNOSIS — I509 Heart failure, unspecified: Secondary | ICD-10-CM

## 2011-11-25 DIAGNOSIS — E119 Type 2 diabetes mellitus without complications: Secondary | ICD-10-CM

## 2011-11-25 DIAGNOSIS — I4891 Unspecified atrial fibrillation: Secondary | ICD-10-CM

## 2011-11-25 LAB — HEPATIC FUNCTION PANEL
Albumin: 3.8 g/dL (ref 3.5–5.2)
Alkaline Phosphatase: 36 U/L — ABNORMAL LOW (ref 39–117)
Bilirubin, Direct: 0.1 mg/dL (ref 0.0–0.3)
Total Protein: 6.7 g/dL (ref 6.0–8.3)

## 2011-11-25 LAB — BASIC METABOLIC PANEL
CO2: 28 mEq/L (ref 19–32)
Calcium: 9.2 mg/dL (ref 8.4–10.5)
Creatinine, Ser: 2.8 mg/dL — ABNORMAL HIGH (ref 0.4–1.5)
GFR: 23.75 mL/min — ABNORMAL LOW (ref >60.00)
Sodium: 136 mEq/L (ref 135–145)

## 2011-11-25 LAB — HEMOGLOBIN A1C: Hgb A1c MFr Bld: 8.5 % — ABNORMAL HIGH (ref 4.6–6.5)

## 2011-11-25 MED ORDER — WARFARIN SODIUM 5 MG PO TABS: 5.0000 mg | ORAL_TABLET | ORAL | Status: DC

## 2011-11-25 MED ORDER — INSULIN LISPRO PROT & LISPRO (75-25 MIX) 100 UNIT/ML ~~LOC~~ SUSP: 26.0000 [IU] | Freq: Two times a day (BID) | SUBCUTANEOUS | Status: DC

## 2011-11-25 NOTE — Progress Notes (Signed)
patient comes in for followup of multiple medical problems including type 2 diabetes, hyperlipidemia, hypertension. The patient does not check blood sugar or blood pressure at home. The patetient does not follow an exercise or diet program. The patient denies any polyuria, polydipsia.  In the past the patient has gone to diabetic treatment center. The patient is tolerating medications  Without difficulty. The patient does admit to medication compliance.   Past Medical History  Diagnosis Date  . Atrial fibrillation 07/13/2006  . CARDIOMYOPATHY, PRIMARY, DILATED 08/07/2009  . COLON CANCER, HX OF 07/13/2006  . CONGESTIVE HEART FAILURE 07/13/2006  . CORONARY ARTERY DISEASE 07/13/2006  . PROSTATE CANCER, HX OF 07/13/2006  . RENAL DISEASE, CHRONIC, STAGE III 04/18/2009  . SKIN CANCER, HX OF 04/18/2009  . SLEEP APNEA 11/01/2008  . Hypertension   . Hyperlipidemia   . Diabetes mellitus   . Hyperkalemia   . Arthritis     History   Social History  . Marital Status: Married    Spouse Name: N/A    Number of Children: N/A  . Years of Education: N/A   Occupational History  . Not on file.   Social History Main Topics  . Smoking status: Former Smoker    Quit date: 09/22/1966  . Smokeless tobacco: Not on file  . Alcohol Use: No  . Drug Use: No  . Sexually Active:    Other Topics Concern  . Not on file   Social History Narrative  . No narrative on file    Past Surgical History  Procedure Date  . Colectomy   . Prostatectomy   . Penile prosthesis placement   . Removed prosthetic eye   . Ptca     stent placed  . Total knee arthroplasty   . St jude unify     Family History  Problem Relation Age of Onset  . Heart disease Father   . Throat cancer Mother     Allergies  Allergen Reactions  . Prednisone     Current Outpatient Prescriptions on File Prior to Visit  Medication Sig Dispense Refill  . aspirin 81 MG tablet Take 81 mg by mouth daily.        . digoxin (LANOXIN)  0.125 MG tablet Take 1 tablet (125 mcg total) by mouth daily.  30 tablet  0  . furosemide (LASIX) 80 MG tablet Take 1 tablet (80 mg total) by mouth daily.  30 tablet  0  . glucose blood (FREESTYLE TEST STRIPS) test strip 1 each by Other route as needed. Use as instructed       . glyBURIDE (DIABETA) 1.25 MG tablet TAKE ONE TABLET BY MOUTH TWICE A DAY WITH MEALS  180 tablet  2  . hydrALAZINE (APRESOLINE) 25 MG tablet Take 1 tablet (25 mg total) by mouth 3 (three) times daily.  90 tablet  0  . insulin lispro protamine-insulin lispro (HUMALOG 75/25) (75-25) 100 UNIT/ML SUSP Inject 26 Units into the skin 2 (two) times daily with a meal.  10 mL  0  . isosorbide mononitrate (IMDUR) 30 MG 24 hr tablet Take 0.5 tablets (15 mg total) by mouth daily.  90 tablet  3  . metoprolol succinate (TOPROL-XL) 100 MG 24 hr tablet TAKE ONE TABLET BY MOUTH TWICE DAILY  180 tablet  4  . nitroGLYCERIN (NITROSTAT) 0.4 MG SL tablet Place 1 tablet (0.4 mg total) under the tongue every 5 (five) minutes as needed.  50 tablet  3  . potassium chloride SA (K-DUR,KLOR-CON) 20  MEQ tablet TAKE TWO TABLETS BY MOUTH EVERY MORNING  180 tablet  2  . Vitamin D, Ergocalciferol, (DRISDOL) 50000 UNITS CAPS Take 50,000 Units by mouth 2 (two) times a week.           patient denies chest pain, shortness of breath, orthopnea. Denies lower extremity edema, abdominal pain, change in appetite, change in bowel movements. Patient denies rashes, musculoskeletal complaints. No other specific complaints in a complete review of systems.   BP 142/82  Pulse 84  Temp(Src) 98.1 F (36.7 C) (Oral)  Ht 6' (1.829 m)  Wt 199 lb (90.266 kg)  BMI 26.99 kg/m2  well-developed well-nourished male in no acute distress. HEENT exam atraumatic, normocephalic, neck supple without jugular venous distention. Chest clear to auscultation cardiac exam S1-S2 are regular. Abdominal exam overweight with bowel sounds, soft and nontender. Extremities no edema. Neurologic exam is  alert with a normal gait.

## 2011-11-25 NOTE — Assessment & Plan Note (Signed)
Check labs today.

## 2011-11-25 NOTE — Assessment & Plan Note (Signed)
Well controlled, continue meds 

## 2011-11-25 NOTE — Patient Instructions (Signed)
  Latest dosing instructions   Total Sun Mon Tue Wed Thu Fri Sat   27.5 2.5 mg 5 mg 2.5 mg 5 mg 2.5 mg 5 mg 5 mg    (5 mg0.5) (5 mg1) (5 mg0.5) (5 mg1) (5 mg0.5) (5 mg1) (5 mg1)        

## 2011-11-25 NOTE — Assessment & Plan Note (Signed)
No sxs Continue current meds 

## 2011-12-04 ENCOUNTER — Encounter: Payer: Self-pay | Admitting: *Deleted

## 2011-12-04 ENCOUNTER — Encounter: Payer: Self-pay | Admitting: Internal Medicine

## 2011-12-04 ENCOUNTER — Ambulatory Visit (INDEPENDENT_AMBULATORY_CARE_PROVIDER_SITE_OTHER): Payer: Medicare Other | Admitting: *Deleted

## 2011-12-04 DIAGNOSIS — I428 Other cardiomyopathies: Secondary | ICD-10-CM

## 2011-12-04 DIAGNOSIS — I509 Heart failure, unspecified: Secondary | ICD-10-CM

## 2011-12-04 DIAGNOSIS — I4891 Unspecified atrial fibrillation: Secondary | ICD-10-CM

## 2011-12-04 LAB — ICD DEVICE OBSERVATION
DEVICE MODEL ICD: 595859
HV IMPEDENCE: 44 Ohm
MODE SWITCH EPISODES: 0
PACEART VT: 0
RV LEAD AMPLITUDE: 12 mv
RV LEAD THRESHOLD: 0.875 V
TZAT-0004SLOWVT: 8
TZAT-0012SLOWVT: 200 ms
TZAT-0013SLOWVT: 3
TZAT-0018SLOWVT: NEGATIVE
TZAT-0019SLOWVT: 7.5 V
TZON-0003SLOWVT: 300 ms
TZON-0004SLOWVT: 40
TZST-0001SLOWVT: 3
TZST-0001SLOWVT: 5
TZST-0003SLOWVT: 40 J
TZST-0003SLOWVT: 40 J
VENTRICULAR PACING ICD: 69 pct
VF: 0

## 2011-12-04 NOTE — Progress Notes (Signed)
ICD check with [employer] 

## 2011-12-30 ENCOUNTER — Ambulatory Visit (INDEPENDENT_AMBULATORY_CARE_PROVIDER_SITE_OTHER): Payer: Medicare Other | Admitting: Internal Medicine

## 2011-12-30 DIAGNOSIS — I4891 Unspecified atrial fibrillation: Secondary | ICD-10-CM

## 2011-12-30 LAB — POCT INR: INR: 3.2

## 2011-12-30 NOTE — Patient Instructions (Signed)
  Latest dosing instructions   Total Sun Mon Tue Wed Thu Fri Sat   27.5 2.5 mg 5 mg 2.5 mg 5 mg 2.5 mg 5 mg 5 mg    (5 mg0.5) (5 mg1) (5 mg0.5) (5 mg1) (5 mg0.5) (5 mg1) (5 mg1)        

## 2011-12-31 ENCOUNTER — Encounter: Payer: Self-pay | Admitting: Cardiology

## 2011-12-31 ENCOUNTER — Ambulatory Visit (INDEPENDENT_AMBULATORY_CARE_PROVIDER_SITE_OTHER): Payer: Medicare Other | Admitting: Cardiology

## 2011-12-31 VITALS — BP 158/80 | HR 73 | Ht 72.0 in | Wt 203.0 lb

## 2011-12-31 DIAGNOSIS — I428 Other cardiomyopathies: Secondary | ICD-10-CM

## 2011-12-31 DIAGNOSIS — I4891 Unspecified atrial fibrillation: Secondary | ICD-10-CM

## 2011-12-31 DIAGNOSIS — I2510 Atherosclerotic heart disease of native coronary artery without angina pectoris: Secondary | ICD-10-CM

## 2011-12-31 NOTE — Progress Notes (Signed)
HPI:  He is doing well.  He swims just about every day.  He denies chest pain or shortness of breath. He is seeing Dr. Eliott Nine tomorrow.    Current Outpatient Prescriptions  Medication Sig Dispense Refill  . aspirin 81 MG tablet Take 81 mg by mouth daily.        . calcitRIOL (ROCALTROL) 0.25 MCG capsule Take 0.25 mcg by mouth every other day.      . digoxin (LANOXIN) 0.125 MG tablet Take 1 tablet (125 mcg total) by mouth daily.  30 tablet  0  . furosemide (LASIX) 80 MG tablet Take 1 tablet (80 mg total) by mouth daily.  30 tablet  0  . glyBURIDE (DIABETA) 1.25 MG tablet TAKE ONE TABLET BY MOUTH TWICE A DAY WITH MEALS  180 tablet  2  . hydrALAZINE (APRESOLINE) 25 MG tablet Take 1 tablet (25 mg total) by mouth 3 (three) times daily.  90 tablet  0  . insulin lispro protamine-insulin lispro (HUMALOG 75/25) (75-25) 100 UNIT/ML SUSP Inject 29 Units into the skin 2 (two) times daily with a meal.      . isosorbide mononitrate (IMDUR) 30 MG 24 hr tablet Take 0.5 tablets (15 mg total) by mouth daily.  90 tablet  3  . metoprolol succinate (TOPROL-XL) 100 MG 24 hr tablet TAKE ONE TABLET BY MOUTH TWICE DAILY  180 tablet  4  . nitroGLYCERIN (NITROSTAT) 0.4 MG SL tablet Place 1 tablet (0.4 mg total) under the tongue every 5 (five) minutes as needed.  50 tablet  3  . potassium chloride SA (K-DUR,KLOR-CON) 20 MEQ tablet TAKE TWO TABLETS BY MOUTH EVERY MORNING  180 tablet  2  . Vitamin D, Ergocalciferol, (DRISDOL) 50000 UNITS CAPS Take 50,000 Units by mouth 2 (two) times a week.        . warfarin (COUMADIN) 5 MG tablet Take 1 tablet (5 mg total) by mouth as directed. Or as directed  90 tablet  3  . glucose blood (FREESTYLE TEST STRIPS) test strip 1 each by Other route as needed. Use as instructed       . DISCONTD: insulin lispro protamine-insulin lispro (HUMALOG MIX 75/25 KWIKPEN) (75-25) 100 UNIT/ML SUSP Inject 20 Units into the skin 2 (two) times daily with a meal.  45 mL  2    Allergies  Allergen Reactions   . Prednisone     Past Medical History  Diagnosis Date  . Atrial fibrillation 07/13/2006  . CARDIOMYOPATHY, PRIMARY, DILATED 08/07/2009  . COLON CANCER, HX OF 07/13/2006  . CONGESTIVE HEART FAILURE 07/13/2006  . CORONARY ARTERY DISEASE 07/13/2006  . PROSTATE CANCER, HX OF 07/13/2006  . RENAL DISEASE, CHRONIC, STAGE III 04/18/2009  . SKIN CANCER, HX OF 04/18/2009  . SLEEP APNEA 11/01/2008  . Hypertension   . Hyperlipidemia   . Diabetes mellitus   . Hyperkalemia   . Arthritis   . Congestive heart failure, unspecified     Past Surgical History  Procedure Date  . Colectomy   . Prostatectomy   . Penile prosthesis placement   . Removed prosthetic eye   . Ptca     stent placed  . Total knee arthroplasty   . St jude unify     Family History  Problem Relation Age of Onset  . Heart disease Father   . Throat cancer Mother     History   Social History  . Marital Status: Married    Spouse Name: N/A    Number of Children:  N/A  . Years of Education: N/A   Occupational History  . Not on file.   Social History Main Topics  . Smoking status: Former Smoker    Quit date: 09/22/1966  . Smokeless tobacco: Not on file  . Alcohol Use: No  . Drug Use: No  . Sexually Active:    Other Topics Concern  . Not on file   Social History Narrative  . No narrative on file    ROS: Please see the HPI.  All other systems reviewed and negative.  PHYSICAL EXAM:  BP 158/80  Pulse 73  Ht 6' (1.829 m)  Wt 203 lb (92.08 kg)  BMI 27.53 kg/m2  General: Well developed, well nourished, in no acute distress. Head:  Normocephalic and atraumatic. Neck: no JVD Lungs: Clear to auscultation and percussion. Heart: Normal S1 and S2.  No murmur, rubs or gallops.  Abdomen:  Normal bowel sounds; soft; non tender; no organomegaly Pulses: Pulses normal in all 4 extremities. Extremities: No clubbing or cyanosis. Trace  edema. Neurologic: Alert and oriented x 3.  EKG:  Underlying atrial fib.   Ventricular pacing.    ASSESSMENT AND PLAN:

## 2011-12-31 NOTE — Assessment & Plan Note (Signed)
Is scheduled to see Dr. Eliott Nine tomorrow who will likely do labs.  Patient was sent to see education on dialysis to prepare for the future.

## 2011-12-31 NOTE — Patient Instructions (Addendum)
Your physician recommends that you schedule a follow-up appointment in: 4 MONTHS with Dr Riley Kill  Your physician recommends that you continue on your current medications as directed. Please refer to the Current Medication list given to you today.  Order given to have a digoxin level checked with next blood draw.

## 2011-12-31 NOTE — Assessment & Plan Note (Signed)
CM out of proportion to CAD.  Had underlying defib.

## 2011-12-31 NOTE — Assessment & Plan Note (Signed)
No recurrent angina 

## 2011-12-31 NOTE — Assessment & Plan Note (Signed)
Stable rhythm.  On chronic warfarin.

## 2012-01-29 ENCOUNTER — Ambulatory Visit (INDEPENDENT_AMBULATORY_CARE_PROVIDER_SITE_OTHER): Payer: Medicare Other | Admitting: Internal Medicine

## 2012-01-29 DIAGNOSIS — I4891 Unspecified atrial fibrillation: Secondary | ICD-10-CM

## 2012-01-29 LAB — POCT INR: INR: 2.9

## 2012-01-29 NOTE — Patient Instructions (Signed)
  Latest dosing instructions   Total Sun Mon Tue Wed Thu Fri Sat   27.5 2.5 mg 5 mg 2.5 mg 5 mg 2.5 mg 5 mg 5 mg    (5 mg0.5) (5 mg1) (5 mg0.5) (5 mg1) (5 mg0.5) (5 mg1) (5 mg1)        

## 2012-02-12 ENCOUNTER — Telehealth: Payer: Self-pay | Admitting: Family Medicine

## 2012-02-12 NOTE — Telephone Encounter (Signed)
Is it ok to switch? 

## 2012-02-12 NOTE — Telephone Encounter (Signed)
Arline Asp,  This patient's GLYBURIDE will no longer be covered under his insurance, without prior auth. They want him to switch to GLIPIZIDE TABLETS. I needs to know if he can switch. If not, I need to know why, please. Thank you!

## 2012-02-13 MED ORDER — GLIPIZIDE 5 MG PO TABS: 2.5000 mg | ORAL_TABLET | Freq: Two times a day (BID) | ORAL | Status: DC

## 2012-02-13 NOTE — Telephone Encounter (Signed)
Left message for pt to call back  °

## 2012-02-13 NOTE — Telephone Encounter (Signed)
See medication list i made change-- he will have to take 1/2  Of 5 mg tablet

## 2012-02-17 NOTE — Telephone Encounter (Signed)
Pt aware.

## 2012-02-23 ENCOUNTER — Telehealth: Payer: Self-pay | Admitting: *Deleted

## 2012-02-23 NOTE — Telephone Encounter (Signed)
Pt called to verify that he should be taking glipizide and insulin

## 2012-02-24 NOTE — Telephone Encounter (Signed)
For now, yes

## 2012-02-24 NOTE — Telephone Encounter (Signed)
Told pt that his insurance will not cover the glipizide.  He has some left and will finish the glipizide then he will start the the glyburide

## 2012-02-24 NOTE — Telephone Encounter (Signed)
Pt has more questions, he wants to know why he was prescribed glipizide and why he was not informed before it was changed and should he take the glipizide and glyburide.  He said this is the second time his meds have been changed and he did not know about it until he got his prescriptions.

## 2012-03-02 ENCOUNTER — Ambulatory Visit (INDEPENDENT_AMBULATORY_CARE_PROVIDER_SITE_OTHER): Payer: Medicare Other | Admitting: Family

## 2012-03-02 DIAGNOSIS — I4891 Unspecified atrial fibrillation: Secondary | ICD-10-CM

## 2012-03-02 LAB — POCT INR: INR: 3.5

## 2012-03-02 NOTE — Patient Instructions (Signed)
Hold tomorrow's dose. Resume same dosage.    Latest dosing instructions   Total Sun Mon Tue Wed Thu Fri Sat   27.5 2.5 mg 5 mg 2.5 mg 5 mg 2.5 mg 5 mg 5 mg    (5 mg0.5) (5 mg1) (5 mg0.5) (5 mg1) (5 mg0.5) (5 mg1) (5 mg1)

## 2012-03-03 ENCOUNTER — Encounter: Payer: Self-pay | Admitting: Cardiology

## 2012-03-03 ENCOUNTER — Other Ambulatory Visit: Payer: Self-pay

## 2012-03-03 DIAGNOSIS — N184 Chronic kidney disease, stage 4 (severe): Secondary | ICD-10-CM

## 2012-03-04 ENCOUNTER — Ambulatory Visit (INDEPENDENT_AMBULATORY_CARE_PROVIDER_SITE_OTHER): Payer: Medicare Other | Admitting: *Deleted

## 2012-03-04 ENCOUNTER — Encounter: Payer: Self-pay | Admitting: Internal Medicine

## 2012-03-04 DIAGNOSIS — I4891 Unspecified atrial fibrillation: Secondary | ICD-10-CM

## 2012-03-04 DIAGNOSIS — I509 Heart failure, unspecified: Secondary | ICD-10-CM

## 2012-03-04 DIAGNOSIS — I428 Other cardiomyopathies: Secondary | ICD-10-CM

## 2012-03-04 LAB — ICD DEVICE OBSERVATION
CHARGE TIME: 9 s
FVT: 0
HV IMPEDENCE: 45 Ohm
LV LEAD IMPEDENCE ICD: 625 Ohm
LV LEAD THRESHOLD: 0.5 V
RV LEAD AMPLITUDE: 12 mv
RV LEAD IMPEDENCE ICD: 425 Ohm
RV LEAD THRESHOLD: 0.75 V
TOT-0008: 0
TOT-0009: 0
TOT-0010: 8
TZAT-0001SLOWVT: 1
TZAT-0004SLOWVT: 8
TZAT-0019SLOWVT: 7.5 V
TZAT-0020SLOWVT: 1 ms
TZON-0004SLOWVT: 40
TZON-0010SLOWVT: 80 ms
TZST-0001SLOWVT: 3
TZST-0001SLOWVT: 4
TZST-0003SLOWVT: 30 J
TZST-0003SLOWVT: 40 J
VENTRICULAR PACING ICD: 76 pct
VF: 0

## 2012-03-04 NOTE — Progress Notes (Signed)
defib check in clinic  

## 2012-03-16 ENCOUNTER — Encounter: Payer: Self-pay | Admitting: Vascular Surgery

## 2012-03-17 ENCOUNTER — Encounter (INDEPENDENT_AMBULATORY_CARE_PROVIDER_SITE_OTHER): Payer: Medicare Other | Admitting: *Deleted

## 2012-03-17 ENCOUNTER — Ambulatory Visit (INDEPENDENT_AMBULATORY_CARE_PROVIDER_SITE_OTHER): Payer: Medicare Other | Admitting: Vascular Surgery

## 2012-03-17 ENCOUNTER — Encounter: Payer: Self-pay | Admitting: Vascular Surgery

## 2012-03-17 VITALS — BP 138/80 | HR 70 | Resp 18 | Ht 72.0 in | Wt 198.0 lb

## 2012-03-17 DIAGNOSIS — Z01810 Encounter for preprocedural cardiovascular examination: Secondary | ICD-10-CM

## 2012-03-17 DIAGNOSIS — N184 Chronic kidney disease, stage 4 (severe): Secondary | ICD-10-CM

## 2012-03-17 DIAGNOSIS — N186 End stage renal disease: Secondary | ICD-10-CM

## 2012-03-17 NOTE — Progress Notes (Addendum)
[facility]  Patient name: [name] [name]. MRN: [id number] DOB: June 02, 1931 Sex: male  REASON FOR CONSULT: evaluate for hemodialysis access. Referred by Dr. Camille Bal  HPI: [name] [name]. is a 78 y.o. male who is not yet on dialysis. He has stage IV chronic kidney disease in the setting of hypertension and diabetes. He also has a history of renal cysts he tells me. It is felt that he will likely need dialysis at some point we were asked to provide access. Of note he has had no recent uremic symptoms. Specifically he denies nausea, vomiting, fatigue, anorexia, or palpitations. The patient is right-handed and however his pacemaker is on the left side. Therefore I would favor placing a fistula on the right side.  He is on Coumadin because of a history of atrial fibrillation. His also had a previous pacemaker placed by Dr. Sherryl Manges.  Past Medical History  Diagnosis Date  . Atrial fibrillation 07/13/2006  . CARDIOMYOPATHY, PRIMARY, DILATED 08/07/2009  . COLON CANCER, HX OF 07/13/2006  . CONGESTIVE HEART FAILURE 07/13/2006  . CORONARY ARTERY DISEASE 07/13/2006  . PROSTATE CANCER, HX OF 07/13/2006  . RENAL DISEASE, CHRONIC, STAGE III 04/18/2009  . SKIN CANCER, HX OF 04/18/2009  . SLEEP APNEA 11/01/2008  . Hypertension   . Hyperlipidemia   . Diabetes mellitus   . Hyperkalemia   . Arthritis   . Congestive heart failure, unspecified   . Cancer     skin, colon, prostate    Family History  Problem Relation Age of Onset  . Heart disease Father   . Cancer Father   . Throat cancer Mother   . Cancer Mother   . Heart disease Mother   . Hypertension Mother     SOCIAL HISTORY: History  Substance Use Topics  . Smoking status: Former Smoker    Types: Cigarettes    Quit date: 09/22/1966  . Smokeless tobacco: Never Used  . Alcohol Use: 0.0 - 0.5 oz/week    0-1 drink(s) per week     occasional    Allergies  Allergen Reactions  . Prednisone       Current Outpatient Prescriptions  Medication Sig Dispense Refill  . aspirin 81 MG tablet Take 81 mg by mouth daily.        . calcitRIOL (ROCALTROL) 0.25 MCG capsule Take 0.25 mcg by mouth daily.       . digoxin (LANOXIN) 0.125 MG tablet Take 1 tablet (125 mcg total) by mouth daily.  30 tablet  0  . furosemide (LASIX) 80 MG tablet Take 1 tablet (80 mg total) by mouth daily.  30 tablet  0  . glucose blood (FREESTYLE TEST STRIPS) test strip 1 each by Other route as needed. Use as instructed       . glyBURIDE (DIABETA) 1.25 MG tablet Take 1.25 mg by mouth 2 (two) times daily.      . hydrALAZINE (APRESOLINE) 25 MG tablet Take 1 tablet (25 mg total) by mouth 3 (three) times daily.  90 tablet  0  . insulin lispro protamine-insulin lispro (HUMALOG 75/25) (75-25) 100 UNIT/ML SUSP Inject 29 Units into the skin 2 (two) times daily with a meal.      . isosorbide mononitrate (IMDUR) 30 MG 24 hr tablet Take 0.5 tablets (15 mg total) by mouth daily.  90 tablet  3  . metoprolol succinate (TOPROL-XL) 100 MG 24 hr tablet TAKE ONE TABLET BY MOUTH TWICE DAILY  180 tablet  4  . nitroGLYCERIN (NITROSTAT) 0.4 MG SL tablet Place 1 tablet (0.4 mg total) under the tongue every 5 (five) minutes as needed.  50 tablet  3  . Nutritional Supplements (MELATONIN PO) Take 1 tablet by mouth daily.      . potassium chloride SA (K-DUR,KLOR-CON) 20 MEQ tablet TAKE TWO TABLETS BY MOUTH EVERY MORNING  180 tablet  2  . Vitamin D, Ergocalciferol, (DRISDOL) 50000 UNITS CAPS Take 50,000 Units by mouth 2 (two) times a week.        . warfarin (COUMADIN) 5 MG tablet Take 1 tablet (5 mg total) by mouth as directed. Or as directed  90 tablet  3  . glipiZIDE (GLUCOTROL) 5 MG tablet Take 0.5 tablets (2.5 mg total) by mouth 2 (two) times daily before a meal.  90 tablet  3  . DISCONTD: insulin lispro protamine-insulin lispro (HUMALOG MIX 75/25 KWIKPEN) (75-25) 100 UNIT/ML SUSP Inject 20 Units into the skin 2 (two) times daily with a meal.  45  mL  2    REVIEW OF SYSTEMS: Arly.Keller ] denotes positive finding; [  ] denotes negative finding  CARDIOVASCULAR:  [ ]  chest pain   [ ]  chest pressure   [ ]  palpitations   [ ]  orthopnea   [ ]  dyspnea on exertion   [ ]  claudication   [ ]  rest pain   [ ]  DVT   [ ]  phlebitis PULMONARY:   [ ]  productive cough   [ ]  asthma   [ ]  wheezing NEUROLOGIC:   [ ]  weakness  [ ]  paresthesias  [ ]  aphasia  [ ]  amaurosis  [ ]  dizziness HEMATOLOGIC:   [ ]  bleeding problems   [ ]  clotting disorders MUSCULOSKELETAL:  [ ]  joint pain   [ ]  joint swelling [ ]  leg swelling GASTROINTESTINAL: [ ]   blood in stool  [ ]   hematemesis GENITOURINARY:  [ ]   dysuria  [ ]   hematuria PSYCHIATRIC:  [ ]  history of major depression INTEGUMENTARY:  [ ]  rashes  [ ]  ulcers CONSTITUTIONAL:  [ ]  fever   [ ]  chills  PHYSICAL EXAM: Filed Vitals:   03/17/12 1012  BP: 138/80  Pulse: 70  Resp: 18  Height: 6' (1.829 m)  Weight: 198 lb (89.812 kg)  SpO2: 99%   Body mass index is 26.85 kg/(m^2). GENERAL: The patient is a well-nourished male, in no acute distress. The vital signs are documented above. CARDIOVASCULAR: There is a regular rate and rhythm without significant murmur appreciated. I do not detect carotid bruits. He has palpable brachial and radial pulses bilaterally. PULMONARY: There is good air exchange bilaterally without wheezing or rales. ABDOMEN: Soft and non-tender with normal pitched bowel sounds.  MUSCULOSKELETAL: There are no major deformities or cyanosis. NEUROLOGIC: No focal weakness or paresthesias are detected. SKIN: There are no ulcers or rashes noted. PSYCHIATRIC: The patient has a normal affect.  DATA:  I have independently interpreted his vein mapping which shows that he appears to have a reasonable size forearm and upper cephalic vein on the left and also a reasonable basilic vein. Likewise the veins in the right arm appeared to be a reasonable.  I have reviewed his records from Dr. Jenita Seashore office. He  has proteinuria chronic kidney disease, hypertension, type 2 diabetes, secondary hyperparathyroidism. He also has a history of coronary artery disease with LV dysfunction and cardiomyopathy with ejection fraction of 30-35%.  MEDICAL ISSUES: He appears to be a reasonable candidate for a right  arm AV fistula. As he has a pacemaker on the left side I have recommended we place his fistula on the right side.  I have explained the indications for placement of an AV fistula or AV graft. I've explained that if at all possible we will place an AV fistula.  I have reviewed the risks of placement of an AV fistula including but not limited to: failure of the fistula to mature, need for subsequent interventions, and thrombosis. In addition I have reviewed the potential complications of placement of an AV graft. These risks include, but are not limited to, graft thrombosis, graft infection, wound healing problems, bleeding, arm swelling, and steal syndrome. We will plan on placing an AV fistula. If by some small chance this was not possible I would likely hold off on placing an AV graft.All the patient's questions were answered and they are agreeable to proceed with surgery. We will need to stop his Coumadin 4 days prior to surgery. He states he will call to schedule surgery on the week of July 8. He needs to check his calendar at home.   Airelle Everding S [facility]  [place]: [phone]

## 2012-03-22 ENCOUNTER — Encounter (HOSPITAL_COMMUNITY): Payer: Self-pay | Admitting: Pharmacy Technician

## 2012-03-23 ENCOUNTER — Ambulatory Visit (INDEPENDENT_AMBULATORY_CARE_PROVIDER_SITE_OTHER): Payer: Medicare Other | Admitting: Family

## 2012-03-23 DIAGNOSIS — I4891 Unspecified atrial fibrillation: Secondary | ICD-10-CM

## 2012-03-23 NOTE — Patient Instructions (Addendum)
Same dose, 5mg  on Mondays,wednesdays fridays and saturdays. 2.5mg  on other days ,check in 3 weeks.    Latest dosing instructions   Total Sun Mon Tue Wed Thu Fri Sat   27.5 2.5 mg 5 mg 2.5 mg 5 mg 2.5 mg 5 mg 5 mg    (5 mg0.5) (5 mg1) (5 mg0.5) (5 mg1) (5 mg0.5) (5 mg1) (5 mg1)

## 2012-03-24 ENCOUNTER — Other Ambulatory Visit: Payer: Self-pay

## 2012-03-26 ENCOUNTER — Encounter (HOSPITAL_COMMUNITY): Payer: Self-pay

## 2012-03-26 ENCOUNTER — Encounter (HOSPITAL_COMMUNITY)
Admission: RE | Admit: 2012-03-26 | Discharge: 2012-03-26 | Disposition: A | Discharge status: Home / Self Care | Payer: Medicare Other | Source: Ambulatory Visit | Attending: Vascular Surgery | Admitting: Vascular Surgery

## 2012-03-26 LAB — SURGICAL PCR SCREEN
MRSA, PCR: NEGATIVE
Staphylococcus aureus: NEGATIVE

## 2012-03-26 NOTE — Progress Notes (Signed)
Notified Kristine from [facility] of pt.'s surgery date and time. [facility] stated they would have someone available around 1000 day of surgery. Implanted cardiac device programming sheet faxed to [place] [facility].

## 2012-03-26 NOTE — Pre-Procedure Instructions (Signed)
20 [name] [name].  03/26/2012   Your procedure is scheduled on:  Tuesday July 9  Report to [facility] at 7:30 AM.  Call this number if you have problems the morning of surgery: [phone]   Remember:   Do not eat food:After Midnight.  May have clear liquids:until Midnight .    Take these medicines the morning of surgery with A SIP OF WATER: lanoxin,,hydralazine,metoprolol,isosorbide   Do not wear jewelry, make-up or nail polish.  Do not wear lotions, powders, or perfumes. You may wear deodorant.  Do not shave 48 hours prior to surgery. Men may shave face and neck.  Do not bring valuables to the hospital.  Contacts, dentures or bridgework may not be worn into surgery.  Leave suitcase in the car. After surgery it may be brought to your room.  For patients admitted to the hospital, checkout time is 11:00 AM the day of discharge.   Patients discharged the day of surgery will not be allowed to drive home.  Name and phone number of your driver: Gentry Roch [phone]  Special Instructions: CHG Shower Use Special Wash: 1/2 bottle night before surgery and 1/2 bottle morning of surgery.   Please read over the following fact sheets that you were given: Pain Booklet, Coughing and Deep Breathing, MRSA Information and Surgical Site Infection Prevention

## 2012-03-26 NOTE — Progress Notes (Signed)
Dr. Riley Kill is cardiologist. Dr. Graciela Husbands manages icd/pacemaker.

## 2012-03-26 NOTE — Pre-Procedure Instructions (Signed)
20 [name] R Eddings Jr.  03/26/2012   Your procedure is scheduled on:  Tuesday July 9  Report to [facility] at 7:30 AM.  Call this number if you have problems the morning of surgery: [phone]   Remember:   Do not eat food:After Midnight.  May have clear liquids:until Midnight .    Take these medicines the morning of surgery with A SIP OF WATER: lanoxin,,hydralazine,metoprolol,isosorbide   Do not wear jewelry, make-up or nail polish.  Do not wear lotions, powders, or perfumes. You may wear deodorant.  Do not shave 48 hours prior to surgery. Men may shave face and neck.  Do not bring valuables to the hospital.  Contacts, dentures or bridgework may not be worn into surgery.  Leave suitcase in the car. After surgery it may be brought to your room.  For patients admitted to the hospital, checkout time is 11:00 AM the day of discharge.   Patients discharged the day of surgery will not be allowed to drive home.  Name and phone number of your driver: Serena Sweney [phone]  Special Instructions: CHG Shower Use Special Wash: 1/2 bottle night before surgery and 1/2 bottle morning of surgery.   Please read over the following fact sheets that you were given: Pain Booklet, Coughing and Deep Breathing, MRSA Information and Surgical Site Infection Prevention   

## 2012-03-29 ENCOUNTER — Ambulatory Visit (INDEPENDENT_AMBULATORY_CARE_PROVIDER_SITE_OTHER): Payer: Medicare Other | Admitting: Internal Medicine

## 2012-03-29 ENCOUNTER — Encounter: Payer: Self-pay | Admitting: Internal Medicine

## 2012-03-29 ENCOUNTER — Encounter (HOSPITAL_COMMUNITY): Payer: Self-pay | Admitting: Vascular Surgery

## 2012-03-29 VITALS — BP 148/86 | HR 68 | Temp 97.7°F | Wt 199.0 lb

## 2012-03-29 DIAGNOSIS — N189 Chronic kidney disease, unspecified: Secondary | ICD-10-CM

## 2012-03-29 DIAGNOSIS — E119 Type 2 diabetes mellitus without complications: Secondary | ICD-10-CM

## 2012-03-29 DIAGNOSIS — E785 Hyperlipidemia, unspecified: Secondary | ICD-10-CM

## 2012-03-29 DIAGNOSIS — I2510 Atherosclerotic heart disease of native coronary artery without angina pectoris: Secondary | ICD-10-CM

## 2012-03-29 DIAGNOSIS — I10 Essential (primary) hypertension: Secondary | ICD-10-CM

## 2012-03-29 DIAGNOSIS — I509 Heart failure, unspecified: Secondary | ICD-10-CM

## 2012-03-29 LAB — CBC WITH DIFFERENTIAL/PLATELET
Basophils Absolute: 0 10*3/uL (ref 0.0–0.1)
Eosinophils Absolute: 0.1 10*3/uL (ref 0.0–0.7)
Lymphocytes Relative: 25.7 % (ref 12.0–46.0)
Lymphs Abs: 1.5 10*3/uL (ref 0.7–4.0)
MCHC: 33.9 g/dL (ref 30.0–36.0)
Monocytes Relative: 7.4 % (ref 3.0–12.0)
Platelets: 118 10*3/uL — ABNORMAL LOW (ref 150.0–400.0)
RDW: 14.4 % (ref 11.5–14.6)

## 2012-03-29 LAB — BASIC METABOLIC PANEL
BUN: 49 mg/dL — ABNORMAL HIGH (ref 6–23)
Calcium: 9.3 mg/dL (ref 8.4–10.5)
Creatinine, Ser: 3 mg/dL — ABNORMAL HIGH (ref 0.4–1.5)
GFR: 21.56 mL/min — ABNORMAL LOW (ref >60.00)
Potassium: 3.5 mEq/L (ref 3.5–5.1)

## 2012-03-29 LAB — APTT: aPTT: 32.7 s — ABNORMAL HIGH (ref 21.7–28.8)

## 2012-03-29 LAB — HEPATIC FUNCTION PANEL: Total Bilirubin: 1 mg/dL (ref 0.3–1.2)

## 2012-03-29 LAB — LIPID PANEL
Cholesterol: 214 mg/dL — ABNORMAL HIGH (ref 0–200)
HDL: 41.1 mg/dL (ref >39.00)
Total CHOL/HDL Ratio: 5
Triglycerides: 372 mg/dL — ABNORMAL HIGH (ref 0.0–149.0)
VLDL: 74.4 mg/dL — ABNORMAL HIGH (ref 0.0–40.0)

## 2012-03-29 LAB — PROTIME-INR: Prothrombin Time: 11.8 s (ref 10.2–12.4)

## 2012-03-29 LAB — LDL CHOLESTEROL, DIRECT: Direct LDL: 101.7 mg/dL

## 2012-03-29 MED ORDER — CEFAZOLIN SODIUM-DEXTROSE 2-3 GM-% IV SOLR
2.0000 g | Freq: Once | INTRAVENOUS | Status: AC
  Administered 2012-03-30: 2 g via INTRAVENOUS
  Filled 2012-03-29: qty 50

## 2012-03-29 MED ORDER — INSULIN LISPRO PROT & LISPRO (75-25 MIX) 100 UNIT/ML ~~LOC~~ SUSP: 40.0000 [IU] | Freq: Two times a day (BID) | SUBCUTANEOUS | Status: DC

## 2012-03-29 MED ORDER — SODIUM CHLORIDE 0.9 % IV SOLN
INTRAVENOUS | Status: DC
  Administered 2012-03-30 (×2): via INTRAVENOUS

## 2012-03-29 NOTE — Progress Notes (Signed)
Patient ID: [name] Criado., male   DOB: Jun 07, 1931, 78 y.o.   MRN: [id number]   patient comes in for followup of multiple medical problems including type 2 diabetes, hyperlipidemia, hypertension. The patient does not check blood sugar or blood pressure at home. The patetient does not follow an exercise or diet program. The patient denies any polyuria, polydipsia.  In the past the patient has gone to diabetic treatment center. The patient is tolerating medications  Without difficulty. The patient does admit to medication compliance.   Wife recently diagnosed with lung CA-- metastatic.  Pt scheduled for fistula tomorrow-- renal failure (dr. Eliott Nine).  Past Medical History  Diagnosis Date  . Atrial fibrillation 07/13/2006  . CARDIOMYOPATHY, PRIMARY, DILATED 08/07/2009  . COLON CANCER, HX OF 07/13/2006  . CONGESTIVE HEART FAILURE 07/13/2006  . CORONARY ARTERY DISEASE 07/13/2006  . PROSTATE CANCER, HX OF 07/13/2006  . RENAL DISEASE, CHRONIC, STAGE III 04/18/2009  . SKIN CANCER, HX OF 04/18/2009  . SLEEP APNEA 11/01/2008  . Hypertension   . Hyperlipidemia   . Diabetes mellitus   . Hyperkalemia   . Arthritis   . Congestive heart failure, unspecified   . Cancer     skin, colon, prostate    History   Social History  . Marital Status: Married    Spouse Name: N/A    Number of Children: N/A  . Years of Education: N/A   Occupational History  . Not on file.   Social History Main Topics  . Smoking status: Former Smoker    Types: Cigarettes    Quit date: 09/22/1966  . Smokeless tobacco: Never Used  . Alcohol Use: 0.0 - 0.5 oz/week    0-1 drink(s) per week     occasional  . Drug Use: No  . Sexually Active: Not on file   Other Topics Concern  . Not on file   Social History Narrative  . No narrative on file    Past Surgical History  Procedure Date  . Colectomy   . Prostatectomy   . Penile prosthesis placement   . Removed prosthetic eye   . Ptca     stent placed  . Total  knee arthroplasty   . St jude unify   . Pacemaker insertion 2010    Family History  Problem Relation Age of Onset  . Heart disease Father   . Cancer Father   . Throat cancer Mother   . Cancer Mother   . Heart disease Mother   . Hypertension Mother     Allergies  Allergen Reactions  . Prednisone Other (See Comments)    Raised blood sugar    Current Outpatient Prescriptions on File Prior to Visit  Medication Sig Dispense Refill  . aspirin 81 MG tablet Take 81 mg by mouth daily.        . calcitRIOL (ROCALTROL) 0.25 MCG capsule Take 0.25 mcg by mouth daily.       . digoxin (LANOXIN) 0.125 MG tablet Take 1 tablet (125 mcg total) by mouth daily.  30 tablet  0  . furosemide (LASIX) 80 MG tablet Take 1 tablet (80 mg total) by mouth daily.  30 tablet  0  . glucose blood (FREESTYLE TEST STRIPS) test strip 1 each by Other route as needed. Use as instructed       . hydrALAZINE (APRESOLINE) 25 MG tablet Take 1 tablet (25 mg total) by mouth 3 (three) times daily.  90 tablet  0  . insulin lispro protamine-insulin lispro (  HUMALOG 75/25) (75-25) 100 UNIT/ML SUSP Inject 29 Units into the skin 2 (two) times daily with a meal.      . isosorbide mononitrate (IMDUR) 30 MG 24 hr tablet Take 0.5 tablets (15 mg total) by mouth daily.  90 tablet  3  . metoprolol succinate (TOPROL-XL) 100 MG 24 hr tablet TAKE ONE TABLET BY MOUTH TWICE DAILY  180 tablet  4  . nitroGLYCERIN (NITROSTAT) 0.4 MG SL tablet Place 1 tablet (0.4 mg total) under the tongue every 5 (five) minutes as needed.  50 tablet  3  . Nutritional Supplements (MELATONIN PO) Take 1 tablet by mouth daily.      . potassium chloride SA (K-DUR,KLOR-CON) 20 MEQ tablet       . Vitamin D, Ergocalciferol, (DRISDOL) 50000 UNITS CAPS Take 50,000 Units by mouth every 7 (seven) days.       [name] warfarin (COUMADIN) 5 MG tablet Take 5 mg by mouth daily. mon wed and fri takes 5 mg and 2.5 mg all other days      . DISCONTD: insulin lispro protamine-insulin lispro  (HUMALOG MIX 75/25 KWIKPEN) (75-25) 100 UNIT/ML SUSP Inject 20 Units into the skin 2 (two) times daily with a meal.  45 mL  2     patient denies chest pain, shortness of breath, orthopnea. Denies lower extremity edema, abdominal pain, change in appetite, change in bowel movements. Patient denies rashes, musculoskeletal complaints. No other specific complaints in a complete review of systems.   BP 174/92  Pulse 68  Temp 97.7 F (36.5 C) (Oral)  Wt 199 lb (90.266 kg)  well-developed well-nourished male in no acute distress. HEENT exam atraumatic, normocephalic, neck supple without jugular venous distention. Chest clear to auscultation cardiac exam S1-S2 are regular. Abdominal exam overweight with bowel sounds, soft and nontender. Extremities no edema. Neurologic exam is alert with a normal gait.

## 2012-03-29 NOTE — Progress Notes (Signed)
Spoke to [name] [name], notified him of surgery time change, gave new arrival time of 0800.

## 2012-03-29 NOTE — Consult Note (Signed)
Anesthesia Chart Review:  Patient is a 78 year old male scheduled for creation of a right arm AVF by Dr. Edilia Bo on 03/30/12.  History includes DM2, HLD, HTN, CKD not yet on HD (Dr. Eliott Nine), afib, dilated cardiomyopathy s/p St[employer] Jude single chamber ICD 07/19/09, CHF, CAD s/p non-DES LAD 05/01/08, prostate CA s/p prostatectomy with penile prosthesis, OSA, colon cancer.  PCP is Dr. Cato Mulligan (seeing today (03/29/12), but note is still pending).  His Cardiologist is Dr. Riley Kill, last visit on 01/03/12.  By notes, it appeared he was felt overall stable from a cardiac standpoint and was swimming almost daily.  EKG then showed v-paced with underlying afib @ 73 bpm.  Dr. Graciela Husbands is his EP Cardiologist, last visit 09/04/11.  He was 71% V-paced at that interrogation.  His last cardiac cath was on 06/27/09 and showed patent LAD stent with 30% proximal LAD, 70% ostial stenosis of small DIAG1/2, 20% Cx, 30% mid RCA, non-ischemic cardiomyopathy with reduced CO and mild elevated pulmonary pressures.  Echo on 01/09/10 showed: - Left ventricle: The cavity size was normal. Wall thickness was increased in a pattern of mild LVH. Systolic function was moderately to severely reduced. The estimated ejection fraction was in the range of 30% to 35%. Diffuse hypokinesis. - Aortic valve: Mild regurgitation. Valve area: 2.13cm^2(VTI). Valve area: 2.3cm^2 (Vmax). - Mitral valve: Mild regurgitation. - Left atrium: The atrium was moderately dilated. - Right atrium: The atrium was moderately dilated. - Pulmonary arteries: Systolic pressure was mildly increased.  CXR on 03/26/12 showed stable cardiomegaly with permanent pacemaker. No change in hyperaeration. No active lung disease.   He is for ISTAT and repeat coags on arrival.  Alphia Moh, PA-C

## 2012-03-30 ENCOUNTER — Ambulatory Visit (HOSPITAL_COMMUNITY): Payer: Medicare Other | Admitting: Vascular Surgery

## 2012-03-30 ENCOUNTER — Ambulatory Visit: Payer: Medicare Other | Admitting: Internal Medicine

## 2012-03-30 ENCOUNTER — Ambulatory Visit (HOSPITAL_COMMUNITY)
Admission: RE | Admit: 2012-03-30 | Discharge: 2012-03-30 | Disposition: A | Discharge status: Home / Self Care | Payer: Medicare Other | Source: Ambulatory Visit | Attending: Vascular Surgery | Admitting: Vascular Surgery

## 2012-03-30 ENCOUNTER — Encounter (HOSPITAL_COMMUNITY)
Admission: RE | Disposition: A | Discharge status: Home / Self Care | Payer: Self-pay | Source: Ambulatory Visit | Attending: Vascular Surgery

## 2012-03-30 ENCOUNTER — Telehealth: Payer: Self-pay | Admitting: Vascular Surgery

## 2012-03-30 ENCOUNTER — Encounter (HOSPITAL_COMMUNITY): Payer: Self-pay | Admitting: Vascular Surgery

## 2012-03-30 DIAGNOSIS — I129 Hypertensive chronic kidney disease with stage 1 through stage 4 chronic kidney disease, or unspecified chronic kidney disease: Secondary | ICD-10-CM | POA: Insufficient documentation

## 2012-03-30 DIAGNOSIS — E785 Hyperlipidemia, unspecified: Secondary | ICD-10-CM | POA: Insufficient documentation

## 2012-03-30 DIAGNOSIS — N184 Chronic kidney disease, stage 4 (severe): Secondary | ICD-10-CM | POA: Insufficient documentation

## 2012-03-30 DIAGNOSIS — N186 End stage renal disease: Secondary | ICD-10-CM

## 2012-03-30 DIAGNOSIS — I509 Heart failure, unspecified: Secondary | ICD-10-CM | POA: Insufficient documentation

## 2012-03-30 DIAGNOSIS — E119 Type 2 diabetes mellitus without complications: Secondary | ICD-10-CM | POA: Insufficient documentation

## 2012-03-30 DIAGNOSIS — Z01812 Encounter for preprocedural laboratory examination: Secondary | ICD-10-CM | POA: Insufficient documentation

## 2012-03-30 DIAGNOSIS — Z01818 Encounter for other preprocedural examination: Secondary | ICD-10-CM | POA: Insufficient documentation

## 2012-03-30 HISTORY — PX: AV FISTULA PLACEMENT: SHX1204

## 2012-03-30 LAB — GLUCOSE, CAPILLARY: Glucose-Capillary: 193 mg/dL — ABNORMAL HIGH (ref 70–99)

## 2012-03-30 LAB — POCT I-STAT 4, (NA,K, GLUC, HGB,HCT)
Glucose, Bld: 213 mg/dL — ABNORMAL HIGH (ref 70–99)
HCT: 40 % (ref 39.0–52.0)
Hemoglobin: 13.6 g/dL (ref 13.0–17.0)

## 2012-03-30 SURGERY — ARTERIOVENOUS (AV) FISTULA CREATION
Anesthesia: Monitor Anesthesia Care | Site: Arm Lower | Laterality: Right | Wound class: Clean

## 2012-03-30 MED ORDER — THROMBIN 20000 UNITS EX SOLR
CUTANEOUS | Status: AC
  Filled 2012-03-30: qty 20000

## 2012-03-30 MED ORDER — ONDANSETRON HCL 4 MG/2ML IJ SOLN: 4.0000 mg | Freq: Once | INTRAMUSCULAR | Status: DC | PRN

## 2012-03-30 MED ORDER — FENTANYL CITRATE 0.05 MG/ML IJ SOLN
INTRAMUSCULAR | Status: DC | PRN
  Administered 2012-03-30: 50 ug via INTRAVENOUS
  Administered 2012-03-30: 100 ug via INTRAVENOUS

## 2012-03-30 MED ORDER — PROPOFOL 10 MG/ML IV EMUL
INTRAVENOUS | Status: DC | PRN
  Administered 2012-03-30: 100 ug/kg/min via INTRAVENOUS

## 2012-03-30 MED ORDER — 0.9 % SODIUM CHLORIDE (POUR BTL) OPTIME
TOPICAL | Status: DC | PRN
  Administered 2012-03-30: 1000 mL

## 2012-03-30 MED ORDER — PROTAMINE SULFATE 10 MG/ML IV SOLN
INTRAVENOUS | Status: DC | PRN
  Administered 2012-03-30: 10 mg via INTRAVENOUS

## 2012-03-30 MED ORDER — LIDOCAINE HCL (CARDIAC) 20 MG/ML IV SOLN
INTRAVENOUS | Status: DC | PRN
  Administered 2012-03-30: 30 mg via INTRAVENOUS

## 2012-03-30 MED ORDER — LIDOCAINE HCL (PF) 1 % IJ SOLN
INTRAMUSCULAR | Status: DC | PRN
  Administered 2012-03-30: 8 mL

## 2012-03-30 MED ORDER — SODIUM CHLORIDE 0.9 % IR SOLN
Status: DC | PRN
  Administered 2012-03-30: 14:00:00

## 2012-03-30 MED ORDER — SODIUM CHLORIDE 0.9 % IV SOLN
INTRAVENOUS | Status: DC | PRN
  Administered 2012-03-30: 13:00:00 via INTRAVENOUS

## 2012-03-30 MED ORDER — LIDOCAINE HCL (PF) 1 % IJ SOLN
INTRAMUSCULAR | Status: AC
  Filled 2012-03-30: qty 30

## 2012-03-30 MED ORDER — HYDROMORPHONE HCL PF 1 MG/ML IJ SOLN: 0.2500 mg | INTRAMUSCULAR | Status: DC | PRN

## 2012-03-30 MED ORDER — OXYCODONE HCL 5 MG PO TABS: 5.0000 mg | ORAL_TABLET | ORAL | Status: AC | PRN

## 2012-03-30 MED ORDER — ACETAMINOPHEN 10 MG/ML IV SOLN
1000.0000 mg | Freq: Once | INTRAVENOUS | Status: DC | PRN
Start: 2012-03-30 — End: 2012-03-30

## 2012-03-30 MED ORDER — HEPARIN SODIUM (PORCINE) 1000 UNIT/ML IJ SOLN
INTRAMUSCULAR | Status: DC | PRN
  Administered 2012-03-30: 7000 [IU] via INTRAVENOUS

## 2012-03-30 SURGICAL SUPPLY — 36 items
CANISTER SUCTION 2500CC (MISCELLANEOUS) ×2 IMPLANT
CLIP TI MEDIUM 6 (CLIP) ×2 IMPLANT
CLIP TI WIDE RED SMALL 6 (CLIP) ×2 IMPLANT
CLOTH BEACON ORANGE TIMEOUT ST (SAFETY) ×2 IMPLANT
COVER PROBE W GEL 5X96 (DRAPES) ×2 IMPLANT
COVER SURGICAL LIGHT HANDLE (MISCELLANEOUS) ×4 IMPLANT
DECANTER SPIKE VIAL GLASS SM (MISCELLANEOUS) ×2 IMPLANT
DERMABOND ADHESIVE PROPEN (GAUZE/BANDAGES/DRESSINGS) ×1
DERMABOND ADVANCED (GAUZE/BANDAGES/DRESSINGS) ×1
DERMABOND ADVANCED .7 DNX12 (GAUZE/BANDAGES/DRESSINGS) ×1 IMPLANT
DERMABOND ADVANCED .7 DNX6 (GAUZE/BANDAGES/DRESSINGS) ×1 IMPLANT
DRAIN PENROSE 1/2X12 LTX STRL (WOUND CARE) IMPLANT
ELECT REM PT RETURN 9FT ADLT (ELECTROSURGICAL) ×2
ELECTRODE REM PT RTRN 9FT ADLT (ELECTROSURGICAL) ×1 IMPLANT
GLOVE BIO SURGEON STRL SZ 6.5 (GLOVE) ×2 IMPLANT
GLOVE BIO SURGEON STRL SZ7.5 (GLOVE) ×2 IMPLANT
GLOVE BIOGEL PI IND STRL 7.0 (GLOVE) ×3 IMPLANT
GLOVE BIOGEL PI IND STRL 7.5 (GLOVE) ×1 IMPLANT
GLOVE BIOGEL PI INDICATOR 7.0 (GLOVE) ×3
GLOVE BIOGEL PI INDICATOR 7.5 (GLOVE) ×1
GOWN STRL NON-REIN LRG LVL3 (GOWN DISPOSABLE) ×6 IMPLANT
KIT BASIN OR (CUSTOM PROCEDURE TRAY) ×2 IMPLANT
KIT ROOM TURNOVER OR (KITS) ×2 IMPLANT
NS IRRIG 1000ML POUR BTL (IV SOLUTION) ×2 IMPLANT
PACK CV ACCESS (CUSTOM PROCEDURE TRAY) ×2 IMPLANT
PAD ARMBOARD 7.5X6 YLW CONV (MISCELLANEOUS) ×4 IMPLANT
SPONGE GAUZE 4X4 12PLY (GAUZE/BANDAGES/DRESSINGS) ×2 IMPLANT
SPONGE SURGIFOAM ABS GEL 100 (HEMOSTASIS) IMPLANT
SUT PROLENE 6 0 BV (SUTURE) ×2 IMPLANT
SUT VIC AB 3-0 SH 27 (SUTURE) ×1
SUT VIC AB 3-0 SH 27X BRD (SUTURE) ×1 IMPLANT
SUT VICRYL 4-0 PS2 18IN ABS (SUTURE) ×2 IMPLANT
TOWEL OR 17X24 6PK STRL BLUE (TOWEL DISPOSABLE) ×2 IMPLANT
TOWEL OR 17X26 10 PK STRL BLUE (TOWEL DISPOSABLE) ×2 IMPLANT
UNDERPAD 30X30 INCONTINENT (UNDERPADS AND DIAPERS) ×2 IMPLANT
WATER STERILE IRR 1000ML POUR (IV SOLUTION) ×2 IMPLANT

## 2012-03-30 NOTE — Telephone Encounter (Signed)
Message copied by Fredrich Birks on Tue Mar 30, 2012  4:17 PM ------      Message from: Phillips Odor      Created: Tue Mar 30, 2012  3:34 PM      Regarding: FW: charges and follow up                   ----- Message -----         From: Chuck Hint, MD         Sent: 03/30/2012   2:32 PM           To: Reuel Derby, Erenest Blank, RN      Subject: charges and follow up                                    PROCEDURE: Right brachiocephalic AV fistula            SURGEON: Di Kindle. Edilia Bo, MD, FACS            ASSIST: Della Goo PA            He will need a follow up visit in 6 weeks to check on the maturation of this fistula. Thank you. CSD

## 2012-03-30 NOTE — Transfer of Care (Signed)
Immediate Anesthesia Transfer of Care Note  Patient: [name] [name].  Procedure(s) Performed: Procedure(s) (LRB): ARTERIOVENOUS (AV) FISTULA CREATION (Right)  Patient Location: PACU  Anesthesia Type: MAC  Level of Consciousness: awake, alert  and oriented  Airway & Oxygen Therapy: Patient Spontanous Breathing  Post-op Assessment: Report given to PACU RN, Post -op Vital signs reviewed and stable and Patient moving all extremities X 4  Post vital signs: Reviewed and stable  Complications: No apparent anesthesia complications

## 2012-03-30 NOTE — Assessment & Plan Note (Signed)
We will check laboratory work today and adjust insulin as necessary.

## 2012-03-30 NOTE — Anesthesia Preprocedure Evaluation (Addendum)
Anesthesia Evaluation  Patient identified by MRN, date of birth, ID band Patient awake    Reviewed: Allergy & Precautions, H&P , NPO status , Patient's Chart, lab work & pertinent test results, reviewed documented beta blocker date and time   Airway Mallampati: III      Dental  (+) Edentulous Upper, Poor Dentition and Dental Advidsory Given   Pulmonary sleep apnea and Continuous Positive Airway Pressure Ventilation ,  breath sounds clear to auscultation        Cardiovascular hypertension, On Home Beta Blockers + CAD and +CHF + dysrhythmias Ventricular Tachycardia Rhythm:Regular Rate:Normal     Neuro/Psych    GI/Hepatic   Endo/Other  Well Controlled  Renal/GU      Musculoskeletal   Abdominal   Peds  Hematology   Anesthesia Other Findings   Reproductive/Obstetrics                          Anesthesia Physical Anesthesia Plan  ASA: III  Anesthesia Plan: MAC   Post-op Pain Management:    Induction:   Airway Management Planned: Natural Airway  Additional Equipment:   Intra-op Plan:   Post-operative Plan:   Informed Consent: I have reviewed the patients History and Physical, chart, labs and discussed the procedure including the risks, benefits and alternatives for the proposed anesthesia with the patient or authorized representative who has indicated his/her understanding and acceptance.   Dental advisory given and Dental Advisory Given  Plan Discussed with: Anesthesiologist, CRNA and Surgeon  Anesthesia Plan Comments: (ESRD has not started HD K- 3.6 Type 2 DM 193 Nonischemic Cardiomyopathy EF 30-35%  Single lead St. Jude pacer AICD will place magnet over it. Htn  Plan MAC  Kipp Brood, MD)       Anesthesia Quick Evaluation

## 2012-03-30 NOTE — H&P (View-Only) (Signed)
[facility] [place]  Patient name: [name] R Topp Jr. MRN: [id number] DOB: 12/11/1930 Sex: male  REASON FOR CONSULT: evaluate for hemodialysis access. Referred by Dr. Cynthia Dunham  HPI: [name] R Huneycutt Jr. is a 78 y.o. male who is not yet on dialysis. He has stage IV chronic kidney disease in the setting of hypertension and diabetes. He also has a history of renal cysts he tells me. It is felt that he will likely need dialysis at some point we were asked to provide access. Of note he has had no recent uremic symptoms. Specifically he denies nausea, vomiting, fatigue, anorexia, or palpitations. The patient is right-handed and however his pacemaker is on the left side. Therefore I would favor placing a fistula on the right side.  He is on Coumadin because of a history of atrial fibrillation. His also had a previous pacemaker placed by Dr. Steven Klein.  Past Medical History  Diagnosis Date  . Atrial fibrillation 07/13/2006  . CARDIOMYOPATHY, PRIMARY, DILATED 08/07/2009  . COLON CANCER, HX OF 07/13/2006  . CONGESTIVE HEART FAILURE 07/13/2006  . CORONARY ARTERY DISEASE 07/13/2006  . PROSTATE CANCER, HX OF 07/13/2006  . RENAL DISEASE, CHRONIC, STAGE III 04/18/2009  . SKIN CANCER, HX OF 04/18/2009  . SLEEP APNEA 11/01/2008  . Hypertension   . Hyperlipidemia   . Diabetes mellitus   . Hyperkalemia   . Arthritis   . Congestive heart failure, unspecified   . Cancer     skin, colon, prostate    Family History  Problem Relation Age of Onset  . Heart disease Father   . Cancer Father   . Throat cancer Mother   . Cancer Mother   . Heart disease Mother   . Hypertension Mother     SOCIAL HISTORY: History  Substance Use Topics  . Smoking status: Former Smoker    Types: Cigarettes    Quit date: 09/22/1966  . Smokeless tobacco: Never Used  . Alcohol Use: 0.0 - 0.5 oz/week    0-1 drink(s) per week     occasional    Allergies  Allergen Reactions  . Prednisone       Current Outpatient Prescriptions  Medication Sig Dispense Refill  . aspirin 81 MG tablet Take 81 mg by mouth daily.        . calcitRIOL (ROCALTROL) 0.25 MCG capsule Take 0.25 mcg by mouth daily.       . digoxin (LANOXIN) 0.125 MG tablet Take 1 tablet (125 mcg total) by mouth daily.  30 tablet  0  . furosemide (LASIX) 80 MG tablet Take 1 tablet (80 mg total) by mouth daily.  30 tablet  0  . glucose blood (FREESTYLE TEST STRIPS) test strip 1 each by Other route as needed. Use as instructed       . glyBURIDE (DIABETA) 1.25 MG tablet Take 1.25 mg by mouth 2 (two) times daily.      . hydrALAZINE (APRESOLINE) 25 MG tablet Take 1 tablet (25 mg total) by mouth 3 (three) times daily.  90 tablet  0  . insulin lispro protamine-insulin lispro (HUMALOG 75/25) (75-25) 100 UNIT/ML SUSP Inject 29 Units into the skin 2 (two) times daily with a meal.      . isosorbide mononitrate (IMDUR) 30 MG 24 hr tablet Take 0.5 tablets (15 mg total) by mouth daily.  90 tablet  3  . metoprolol succinate (TOPROL-XL) 100 MG 24 hr tablet TAKE ONE TABLET BY MOUTH TWICE DAILY  180 tablet    4  . nitroGLYCERIN (NITROSTAT) 0.4 MG SL tablet Place 1 tablet (0.4 mg total) under the tongue every 5 (five) minutes as needed.  50 tablet  3  . Nutritional Supplements (MELATONIN PO) Take 1 tablet by mouth daily.      . potassium chloride SA (K-DUR,KLOR-CON) 20 MEQ tablet TAKE TWO TABLETS BY MOUTH EVERY MORNING  180 tablet  2  . Vitamin D, Ergocalciferol, (DRISDOL) 50000 UNITS CAPS Take 50,000 Units by mouth 2 (two) times a week.        . warfarin (COUMADIN) 5 MG tablet Take 1 tablet (5 mg total) by mouth as directed. Or as directed  90 tablet  3  . glipiZIDE (GLUCOTROL) 5 MG tablet Take 0.5 tablets (2.5 mg total) by mouth 2 (two) times daily before a meal.  90 tablet  3  . DISCONTD: insulin lispro protamine-insulin lispro (HUMALOG MIX 75/25 KWIKPEN) (75-25) 100 UNIT/ML SUSP Inject 20 Units into the skin 2 (two) times daily with a meal.  45  mL  2    REVIEW OF SYSTEMS: [X ] denotes positive finding; [  ] denotes negative finding  CARDIOVASCULAR:  [ ] chest pain   [ ] chest pressure   [ ] palpitations   [ ] orthopnea   [ ] dyspnea on exertion   [ ] claudication   [ ] rest pain   [ ] DVT   [ ] phlebitis PULMONARY:   [ ] productive cough   [ ] asthma   [ ] wheezing NEUROLOGIC:   [ ] weakness  [ ] paresthesias  [ ] aphasia  [ ] amaurosis  [ ] dizziness HEMATOLOGIC:   [ ] bleeding problems   [ ] clotting disorders MUSCULOSKELETAL:  [ ] joint pain   [ ] joint swelling [ ] leg swelling GASTROINTESTINAL: [ ]  blood in stool  [ ]  hematemesis GENITOURINARY:  [ ]  dysuria  [ ]  hematuria PSYCHIATRIC:  [ ] history of major depression INTEGUMENTARY:  [ ] rashes  [ ] ulcers CONSTITUTIONAL:  [ ] fever   [ ] chills  PHYSICAL EXAM: Filed Vitals:   03/17/12 1012  BP: 138/80  Pulse: 70  Resp: 18  Height: 6' (1.829 m)  Weight: 198 lb (89.812 kg)  SpO2: 99%   Body mass index is 26.85 kg/(m^2). GENERAL: The patient is a well-nourished male, in no acute distress. The vital signs are documented above. CARDIOVASCULAR: There is a regular rate and rhythm without significant murmur appreciated. I do not detect carotid bruits. He has palpable brachial and radial pulses bilaterally. PULMONARY: There is good air exchange bilaterally without wheezing or rales. ABDOMEN: Soft and non-tender with normal pitched bowel sounds.  MUSCULOSKELETAL: There are no major deformities or cyanosis. NEUROLOGIC: No focal weakness or paresthesias are detected. SKIN: There are no ulcers or rashes noted. PSYCHIATRIC: The patient has a normal affect.  DATA:  I have independently interpreted his vein mapping which shows that he appears to have a reasonable size forearm and upper cephalic vein on the left and also a reasonable basilic vein. Likewise the veins in the right arm appeared to be a reasonable.  I have reviewed his records from Dr. Cynthia Dunham's office. He  has proteinuria chronic kidney disease, hypertension, type 2 diabetes, secondary hyperparathyroidism. He also has a history of coronary artery disease with LV dysfunction and cardiomyopathy with ejection fraction of 30-35%.  MEDICAL ISSUES: He appears to be a reasonable candidate for a right   arm AV fistula. As he has a pacemaker on the left side I have recommended we place his fistula on the right side.  I have explained the indications for placement of an AV fistula or AV graft. I've explained that if at all possible we will place an AV fistula.  I have reviewed the risks of placement of an AV fistula including but not limited to: failure of the fistula to mature, need for subsequent interventions, and thrombosis. In addition I have reviewed the potential complications of placement of an AV graft. These risks include, but are not limited to, graft thrombosis, graft infection, wound healing problems, bleeding, arm swelling, and steal syndrome. We will plan on placing an AV fistula. If by some small chance this was not possible I would likely hold off on placing an AV graft.All the patient's questions were answered and they are agreeable to proceed with surgery. We will need to stop his Coumadin 4 days prior to surgery. He states he will call to schedule surgery on the week of July 8. He needs to check his calendar at home.   Liara Holm S [facility] [place] Beeper: [phone]    

## 2012-03-30 NOTE — Assessment & Plan Note (Signed)
Patient is euvolemic. I don't think he is having any signs or symptoms of congestive heart failure. He continues to work daily.

## 2012-03-30 NOTE — Interval H&P Note (Signed)
History and Physical Interval Note:  03/30/2012 12:46 PM  [name] [name].  has presented today for surgery, with the diagnosis of End Stage Renal Disease  The various methods of treatment have been discussed with the patient and family. After consideration of risks, benefits and other options for treatment, the patient has consented to: ARTERIOVENOUS (AV) FISTULA CREATION (Right) as a surgical intervention .  The patient's history has been reviewed, patient examined, no change in status, stable for surgery.  I have reviewed the patients' chart and labs.  Questions were answered to the patient's satisfaction.     DICKSON,CHRISTOPHER S

## 2012-03-30 NOTE — Telephone Encounter (Signed)
lvm for patient re: follow up, dpm

## 2012-03-30 NOTE — Anesthesia Postprocedure Evaluation (Signed)
  Anesthesia Post-op Note  Patient: [name] [name].  Procedure(s) Performed: Procedure(s) (LRB): ARTERIOVENOUS (AV) FISTULA CREATION (Right)  Patient Location: PACU  Anesthesia Type: MAC  Level of Consciousness: awake, alert  and oriented  Airway and Oxygen Therapy: Patient Spontanous Breathing and Patient connected to nasal cannula oxygen  Post-op Pain: mild  Post-op Assessment: Post-op Vital signs reviewed and Patient's Cardiovascular Status Stable  Post-op Vital Signs: stable  Complications: No apparent anesthesia complications

## 2012-03-30 NOTE — Op Note (Signed)
NAME: [name] [name].   MRN: [id number] DOB: 26-Aug-1931    DATE OF OPERATION: 03/30/2012  PREOP DIAGNOSIS: Chronic kidney disease  POSTOP DIAGNOSIS: Same   PROCEDURE: Right brachiocephalic AV fistula  SURGEON: Di Kindle. Edilia Bo, MD, FACS  ASSIST: Della Goo PA  ANESTHESIA: local with sedation   EBL: minimal  INDICATIONS: [name] [name]. is a 78 y.o. male who is not yet on dialysis. We were asked to place access. He has a pacemaker on the left side.  FINDINGS: The vein was 4.5 mm.  TECHNIQUE: The patient was brought to the operating room and sedated by anesthesia. The right upper extremity was prepped and draped in the usual sterile fashion. After the skin was anesthetized with 1% lidocaine, a transverse incision was made just above the antecubital level. The cephalic vein was dissected free. It was ligated distally and irrigated out nicely with heparinized saline. It was a 4.5 mm vein. The brachial artery was dissected free beneath the fascia. It had an excellent pulse and was good-sized. The patient was heparinized. The brachial artery was clamped proximally and distally and a longitudinal arteriotomy was made. The vein was mobilized over and sewn end to side to the artery using continuous 6-0 Prolene suture. At the completion there was an excellent thrill in the fistula and a palpable radial pulse. The heparin was partially reversed with protamine. Hemostasis was obtained in the wound. The wound was closed with a deep layer of 3-0 Vicryl and the skin closed with 4-0 Vicryl. Dermabond was applied. Patient tolerated the procedure well and was transferred to the recovery room in stable condition. All needle and sponge counts were correct.  [name] Ferrari, MD, FACS [facility]  DATE OF DICTATION:   03/30/2012

## 2012-03-30 NOTE — Assessment & Plan Note (Signed)
Controlled. I've asked him to monitor this at home. I think he is under a great deal of stress with his wife's recent diagnosis of metastatic lung cancer. Goal blood pressures less than 130/85.

## 2012-03-30 NOTE — Preoperative (Signed)
Beta Blockers   Reason not to administer Beta Blockers:Not Applicable 

## 2012-03-31 ENCOUNTER — Encounter (HOSPITAL_COMMUNITY): Payer: Self-pay | Admitting: Vascular Surgery

## 2012-03-31 ENCOUNTER — Other Ambulatory Visit: Payer: Self-pay | Admitting: *Deleted

## 2012-03-31 NOTE — Procedures (Unsigned)
CEPHALIC VEIN MAPPING  INDICATION:  Preoperative vein mapping for dialysis access.  HISTORY: Atrial fibrillation, CHF, diabetes, hyperlipidemia, chronic kidney disease stage III-IV, hypertension.  EXAM:  The right cephalic vein is patent and compressible with diameter measurements ranging from 0.45 to 0.37 cm.  The right basilic vein is patent and compressible with diameter measurements ranging from 0.56 to 0.31 cm.  The left cephalic vein is patent and compressible with diameter measurements ranging from 0.39 to 0.31 cm.  The left basilic vein is patent and compressible with diameter measurements ranging from 0.52 to 0.36 cm.  See attached worksheet for all measurements.  IMPRESSION:  Patent right and left basilic and cephalic veins with diameter measurements shown on the following worksheet.  ___________________________________________ Di Kindle. Edilia Bo, M.D.  EM/MEDQ  D:  03/17/2012  T:  03/17/2012  Job:  [id number]

## 2012-04-09 ENCOUNTER — Other Ambulatory Visit: Payer: Self-pay | Admitting: *Deleted

## 2012-04-09 MED ORDER — ISOSORBIDE MONONITRATE ER 30 MG PO TB24: 15.0000 mg | ORAL_TABLET | Freq: Every day | ORAL | Status: DC

## 2012-04-12 ENCOUNTER — Encounter: Payer: Self-pay | Admitting: Neurosurgery

## 2012-04-12 ENCOUNTER — Ambulatory Visit (INDEPENDENT_AMBULATORY_CARE_PROVIDER_SITE_OTHER): Payer: Medicare Other | Admitting: Neurosurgery

## 2012-04-12 ENCOUNTER — Ambulatory Visit (INDEPENDENT_AMBULATORY_CARE_PROVIDER_SITE_OTHER): Payer: Medicare Other | Admitting: *Deleted

## 2012-04-12 VITALS — BP 138/72 | HR 71 | Temp 97.9°F | Resp 18 | Ht 72.0 in | Wt 204.8 lb

## 2012-04-12 DIAGNOSIS — T82898A Other specified complication of vascular prosthetic devices, implants and grafts, initial encounter: Secondary | ICD-10-CM | POA: Insufficient documentation

## 2012-04-12 DIAGNOSIS — N186 End stage renal disease: Secondary | ICD-10-CM

## 2012-04-12 DIAGNOSIS — T82598A Other mechanical complication of other cardiac and vascular devices and implants, initial encounter: Secondary | ICD-10-CM

## 2012-04-12 NOTE — Progress Notes (Signed)
Subjective:     Patient ID: [name] [name]., male   DOB: [birth year], 78 y.o.   MRN: [id number]  HPI: 78 year old male patient of Dr. Edilia Bo who underwent a right brachiocephalic AV fistula creation 03/30/2012. Patient called stating his arms swelling is having pain and coolness in his right hand. We brought the patient in for evaluation. The patient states that his symptoms have worsened over the past 5-10 days slowly.   Review of Systems: 12 point review of systems is notable for the difficulties described above otherwise unremarkable     Objective:   Physical Exam: Afebrile, vital signs are stable, the patient has excellent grip in his right hand. There is a very slight notable difference in temperature in the right and left hand. There is good capillary refill in the right hand, the fistula has a good thrill palpation. There is slight edema from the elbow to the wrist on the right.     Assessment:     This patient is 2 weeks status post right brachiocephalic fistula creation with questionable steal syndrome. Dr. Myra Gianotti examined the patient after myself.    Plan:     Per Dr. Myra Gianotti, we obtained a right arm fistula duplex which shows the fistula to  be patent, the steal syndrome evaluation was not done therefore, the recommendation was for the patient return in one week to be evaluated by Dr. Edilia Bo and we will obtain that portion of the study at that time. Dr. Myra Gianotti instructed the patient exercises hand is much is possible and elevate versus dangle the hand as needed. The patient stated understanding, his questions were encouraged and answered, he is in agreement with this plan.  Lauree Chandler ANP  Clinic M.D.: Myra Gianotti

## 2012-04-13 ENCOUNTER — Ambulatory Visit (INDEPENDENT_AMBULATORY_CARE_PROVIDER_SITE_OTHER): Payer: Medicare Other | Admitting: Family

## 2012-04-13 DIAGNOSIS — I4891 Unspecified atrial fibrillation: Secondary | ICD-10-CM

## 2012-04-13 NOTE — Patient Instructions (Addendum)
Same dose, 5mg  on Mondays,wednesdays fridays and saturdays. 2.5mg  on other days ,check in 4 weeks.    Latest dosing instructions   Total Sun Mon Tue Wed Thu Fri Sat   27.5 2.5 mg 5 mg 2.5 mg 5 mg 2.5 mg 5 mg 5 mg    (5 mg0.5) (5 mg1) (5 mg0.5) (5 mg1) (5 mg0.5) (5 mg1) (5 mg1)

## 2012-04-14 ENCOUNTER — Other Ambulatory Visit: Payer: Self-pay | Admitting: *Deleted

## 2012-04-14 DIAGNOSIS — Z48812 Encounter for surgical aftercare following surgery on the circulatory system: Secondary | ICD-10-CM

## 2012-04-14 DIAGNOSIS — T82898A Other specified complication of vascular prosthetic devices, implants and grafts, initial encounter: Secondary | ICD-10-CM

## 2012-04-15 NOTE — Procedures (Unsigned)
VASCULAR LAB EXAM  INDICATION:  Evaluation of right arm AV fistula due to redness and discomfort.  HISTORY: Right brachiocephalic AV fistula placed 03/30/2012 by Dr. Edilia Bo.  The patient presents today with redness and edema for 2 weeks.  End-stage renal disease, diabetic, hypertension, coronary artery disease, CHF, hyperlipidemia. Diabetes: Cardiac: Hypertension:  EXAM:  IMPRESSION:  DUPLEX:  Patent right brachiocephalic AV fistula with a hematoma visualized surrounding the distal portion of the upper arm cephalic vein and AVF causing compression and narrowing in this area which results in a velocity of 590 cm/s. Hematoma measuring approximately 1.88 cm. Please see the following diagram for exam details.  ___________________________________________ Di Kindle. Edilia Bo, M.D.  EM/MEDQ  D:  04/12/2012  T:  04/12/2012  Job:  [id number]

## 2012-04-20 ENCOUNTER — Encounter: Payer: Self-pay | Admitting: Neurosurgery

## 2012-04-21 ENCOUNTER — Encounter: Payer: Self-pay | Admitting: Neurosurgery

## 2012-04-21 ENCOUNTER — Encounter (INDEPENDENT_AMBULATORY_CARE_PROVIDER_SITE_OTHER): Payer: Medicare Other | Admitting: *Deleted

## 2012-04-21 ENCOUNTER — Ambulatory Visit (INDEPENDENT_AMBULATORY_CARE_PROVIDER_SITE_OTHER): Payer: Medicare Other | Admitting: Neurosurgery

## 2012-04-21 VITALS — BP 143/72 | HR 70 | Temp 97.4°F | Resp 16 | Ht 72.0 in | Wt 205.3 lb

## 2012-04-21 DIAGNOSIS — Z48812 Encounter for surgical aftercare following surgery on the circulatory system: Secondary | ICD-10-CM

## 2012-04-21 DIAGNOSIS — N186 End stage renal disease: Secondary | ICD-10-CM

## 2012-04-21 DIAGNOSIS — T82898A Other specified complication of vascular prosthetic devices, implants and grafts, initial encounter: Secondary | ICD-10-CM

## 2012-04-21 NOTE — Progress Notes (Signed)
Subjective:     Patient ID: [name] [name]., male   DOB: November 25, 1930, 78 y.o.   MRN: [id number]  HPI:78 year old male patient of Dr. Edilia Bo who underwent a right brachiocephalic AV fistula creation 03/30/2012. Patient called stating his arms swelling is having pain and coolness in his right hand. We brought the patient in for evaluation. The patient underwent a fistula duplex and steal syndrome study today.    Review of Systems: 12 point review of systems is notable for the difficulties described above otherwise unremarkable     Objective:   Physical Exam: Afebrile, vital signs are stable, surgical wound is well healed, the patient has palpable thrill in the fistula and has palpable radial pulse. The patient's fingers on the right hand are somewhat cooler than the left but very slightly.     Assessment:     Maturing right AV fistula with mild steal syndrome right hand    Plan:     Dr. Edilia Bo looked at the studies from today and spoke with the patient and explained he feels like his fistula is okay, he may have a slight steal syndrome in the right hand but that should improve with time, the patient states understanding and just wanted reassurance. Dr. Edilia Bo explained to the patient he does not have to return unless he has an issue, therefore the patient will followup on a when necessary basis, his questions were encouraged and answered, he is in agreement with this plan.  Lauree Chandler ANP  Clinic M.D.: Edilia Bo

## 2012-05-10 ENCOUNTER — Other Ambulatory Visit: Payer: Self-pay | Admitting: *Deleted

## 2012-05-10 MED ORDER — INSULIN LISPRO PROT & LISPRO (75-25 MIX) 100 UNIT/ML ~~LOC~~ SUSP: 40.0000 [IU] | Freq: Two times a day (BID) | SUBCUTANEOUS | Status: DC

## 2012-05-11 ENCOUNTER — Ambulatory Visit: Payer: Medicare Other | Admitting: Cardiology

## 2012-05-12 ENCOUNTER — Ambulatory Visit: Payer: Medicare Other | Admitting: Vascular Surgery

## 2012-05-13 ENCOUNTER — Ambulatory Visit (INDEPENDENT_AMBULATORY_CARE_PROVIDER_SITE_OTHER): Payer: Medicare Other | Admitting: Cardiology

## 2012-05-13 ENCOUNTER — Encounter (HOSPITAL_BASED_OUTPATIENT_CLINIC_OR_DEPARTMENT_OTHER): Payer: Self-pay | Admitting: Emergency Medicine

## 2012-05-13 ENCOUNTER — Encounter: Payer: Self-pay | Admitting: Cardiology

## 2012-05-13 ENCOUNTER — Emergency Department (HOSPITAL_BASED_OUTPATIENT_CLINIC_OR_DEPARTMENT_OTHER)
Admission: EM | Admit: 2012-05-13 | Discharge: 2012-05-13 | Disposition: A | Discharge status: Home / Self Care | Payer: Medicare Other | Attending: Emergency Medicine | Admitting: Emergency Medicine

## 2012-05-13 VITALS — BP 140/80 | HR 73 | Ht 72.0 in | Wt 202.2 lb

## 2012-05-13 DIAGNOSIS — Z85828 Personal history of other malignant neoplasm of skin: Secondary | ICD-10-CM | POA: Insufficient documentation

## 2012-05-13 DIAGNOSIS — Z950 Presence of cardiac pacemaker: Secondary | ICD-10-CM | POA: Insufficient documentation

## 2012-05-13 DIAGNOSIS — Z7982 Long term (current) use of aspirin: Secondary | ICD-10-CM | POA: Insufficient documentation

## 2012-05-13 DIAGNOSIS — L0291 Cutaneous abscess, unspecified: Secondary | ICD-10-CM | POA: Insufficient documentation

## 2012-05-13 DIAGNOSIS — Z809 Family history of malignant neoplasm, unspecified: Secondary | ICD-10-CM | POA: Insufficient documentation

## 2012-05-13 DIAGNOSIS — IMO0002 Reserved for concepts with insufficient information to code with codable children: Secondary | ICD-10-CM | POA: Insufficient documentation

## 2012-05-13 DIAGNOSIS — Z87891 Personal history of nicotine dependence: Secondary | ICD-10-CM | POA: Insufficient documentation

## 2012-05-13 DIAGNOSIS — Z881 Allergy status to other antibiotic agents status: Secondary | ICD-10-CM | POA: Insufficient documentation

## 2012-05-13 DIAGNOSIS — I4891 Unspecified atrial fibrillation: Secondary | ICD-10-CM

## 2012-05-13 DIAGNOSIS — Z808 Family history of malignant neoplasm of other organs or systems: Secondary | ICD-10-CM | POA: Insufficient documentation

## 2012-05-13 DIAGNOSIS — Z8249 Family history of ischemic heart disease and other diseases of the circulatory system: Secondary | ICD-10-CM | POA: Insufficient documentation

## 2012-05-13 DIAGNOSIS — I10 Essential (primary) hypertension: Secondary | ICD-10-CM

## 2012-05-13 DIAGNOSIS — I2510 Atherosclerotic heart disease of native coronary artery without angina pectoris: Secondary | ICD-10-CM

## 2012-05-13 DIAGNOSIS — Z85038 Personal history of other malignant neoplasm of large intestine: Secondary | ICD-10-CM | POA: Insufficient documentation

## 2012-05-13 DIAGNOSIS — E119 Type 2 diabetes mellitus without complications: Secondary | ICD-10-CM | POA: Insufficient documentation

## 2012-05-13 DIAGNOSIS — Z794 Long term (current) use of insulin: Secondary | ICD-10-CM | POA: Insufficient documentation

## 2012-05-13 DIAGNOSIS — Z8546 Personal history of malignant neoplasm of prostate: Secondary | ICD-10-CM | POA: Insufficient documentation

## 2012-05-13 DIAGNOSIS — I428 Other cardiomyopathies: Secondary | ICD-10-CM

## 2012-05-13 MED ORDER — CEPHALEXIN 500 MG PO CAPS: 500.0000 mg | ORAL_CAPSULE | Freq: Four times a day (QID) | ORAL | Status: DC

## 2012-05-13 MED ORDER — CEPHALEXIN 500 MG PO CAPS: 500.0000 mg | ORAL_CAPSULE | Freq: Four times a day (QID) | ORAL | Status: AC

## 2012-05-13 NOTE — Assessment & Plan Note (Signed)
Recent shunt placed.  Stable with good thrill.  Followed by [facility]

## 2012-05-13 NOTE — Progress Notes (Signed)
HPI:  Patient is in for a followup visit. He is developed on his right hand, at the tip of his nail, a pustular lesion. We talked about this in detail today. He wants to go to the [facility] ER to have this looked at, I strongly encouraged that this morning. Cardiac-wise he is getting along well. His wife was recently diagnosed with stage IV lung cancer, he has not been doing any of his exercise ever since. His recent creatinine has risen to approximately 3, he's had a shunt placed by the vascular surgeons. He denies any major chest pain, and has not had significant fluid overload.  Current Outpatient Prescriptions  Medication Sig Dispense Refill  . aspirin 81 MG tablet Take 81 mg by mouth daily.        . calcitRIOL (ROCALTROL) 0.25 MCG capsule Take 0.25 mcg by mouth daily.       . digoxin (LANOXIN) 0.125 MG tablet Take 1 tablet (125 mcg total) by mouth daily.  30 tablet  0  . furosemide (LASIX) 80 MG tablet Take 1 tablet (80 mg total) by mouth daily.  30 tablet  0  . glipiZIDE (GLUCOTROL) 5 MG tablet Take 5 mg by mouth 2 (two) times daily before a meal.      . glucose blood (FREESTYLE TEST STRIPS) test strip 1 each by Other route as needed. Use as instructed       . hydrALAZINE (APRESOLINE) 25 MG tablet Take 1 tablet (25 mg total) by mouth 3 (three) times daily.  90 tablet  0  . insulin lispro protamine-insulin lispro (HUMALOG 75/25) (75-25) 100 UNIT/ML SUSP Inject 40 Units into the skin 2 (two) times daily with a meal. Or as directed  30 mL  3  . isosorbide mononitrate (IMDUR) 30 MG 24 hr tablet Take 0.5 tablets (15 mg total) by mouth daily.  90 tablet  3  . metoprolol succinate (TOPROL-XL) 100 MG 24 hr tablet TAKE ONE TABLET BY MOUTH TWICE DAILY  180 tablet  4  . nitroGLYCERIN (NITROSTAT) 0.4 MG SL tablet Place 1 tablet (0.4 mg total) under the tongue every 5 (five) minutes as needed.  50 tablet  3  . Nutritional Supplements (MELATONIN PO) Take 1 tablet by mouth daily.      .  potassium chloride SA (K-DUR,KLOR-CON) 20 MEQ tablet Take 40 mEq by mouth daily.       . Vitamin D, Ergocalciferol, (DRISDOL) 50000 UNITS CAPS Take 50,000 Units by mouth every 7 (seven) days.       [name] warfarin (COUMADIN) 5 MG tablet Take 5 mg by mouth daily. mon wed fri and sat  takes 5 mg and 2.5 mg all other days      . DISCONTD: insulin lispro protamine-insulin lispro (HUMALOG MIX 75/25 KWIKPEN) (75-25) 100 UNIT/ML SUSP Inject 20 Units into the skin 2 (two) times daily with a meal.  45 mL  2    Allergies  Allergen Reactions  . Prednisone Other (See Comments)    Raised blood sugar    Past Medical History  Diagnosis Date  . Atrial fibrillation 07/13/2006  . CARDIOMYOPATHY, PRIMARY, DILATED 08/07/2009  . COLON CANCER, HX OF 07/13/2006  . CONGESTIVE HEART FAILURE 07/13/2006  . CORONARY ARTERY DISEASE 07/13/2006  . PROSTATE CANCER, HX OF 07/13/2006  . RENAL DISEASE, CHRONIC, STAGE III 04/18/2009  . SKIN CANCER, HX OF 04/18/2009  . SLEEP APNEA 11/01/2008  . Hypertension   . Hyperlipidemia   . Diabetes mellitus   .  Hyperkalemia   . Arthritis   . Congestive heart failure, unspecified   . Cancer     skin, colon, prostate    Past Surgical History  Procedure Date  . Colectomy   . Prostatectomy   . Penile prosthesis placement   . Removed prosthetic eye   . Ptca     stent placed  . Total knee arthroplasty   . St jude unify     St. Jude single chamber defibrillator 07/19/09 (Dr. Graciela Husbands)  . Pacemaker insertion 2010  . Av fistula placement 03/30/2012    Procedure: ARTERIOVENOUS (AV) FISTULA CREATION;  Surgeon: Chuck Hint, MD;  [facility];  Service: Vascular;  Laterality: Right;  Creation of BrachioCephalic Fistula Right arm    Family History  Problem Relation Age of Onset  . Heart disease Father   . Cancer Father   . Throat cancer Mother   . Cancer Mother   . Heart disease Mother   . Hypertension Mother     History   Social History  . Marital Status: Married      Spouse Name: N/A    Number of Children: N/A  . Years of Education: N/A   Occupational History  . Not on file.   Social History Main Topics  . Smoking status: Former Smoker    Types: Cigarettes    Quit date: 09/22/1966  . Smokeless tobacco: Never Used  . Alcohol Use: 0.0 - 0.5 oz/week    0-1 drink(s) per week     occasional  . Drug Use: No  . Sexually Active: Not on file   Other Topics Concern  . Not on file   Social History Narrative  . No narrative on file    ROS: Please see the HPI.  All other systems reviewed and negative.  PHYSICAL EXAM:  BP 140/80  Pulse 73  Ht 6' (1.829 m)  Wt 202 lb 3.2 oz (91.717 kg)  BMI 27.42 kg/m2  General: Well developed, well nourished, in no acute distress. Head:  Normocephalic and atraumatic. Neck: no JVD Lungs: Clear to auscultation and percussion. Heart: Normal S1 and S2.  No S4.  No def murmur.   Abdomen:  Normal bowel sounds; soft; non tender; no organomegaly Pulses: Pulses normal in all 4 extremities. Extremities: No clubbing or cyanosis. No edema.  Pustular lesion lateral to the nail, r hand.   Neurologic: Alert and oriented x 3.  EKG:  Atrial fib.  V pacing.  Occasional PVCs.  Inferior MI.   ASSESSMENT AND PLAN:

## 2012-05-13 NOTE — Assessment & Plan Note (Signed)
Controlled.  

## 2012-05-13 NOTE — Assessment & Plan Note (Signed)
Small pustular abscess next to right middle digit nail.  Less than 1 cm.  Patient headed to [facility] ER for treatment.

## 2012-05-13 NOTE — ED Provider Notes (Signed)
Medical screening examination/treatment/procedure(s) were performed by non-physician practitioner and as supervising physician I was immediately available for consultation/collaboration.   Charles B. Sheldon, MD 05/13/12 1513 

## 2012-05-13 NOTE — Assessment & Plan Note (Signed)
Volume status ok.

## 2012-05-13 NOTE — ED Provider Notes (Signed)
History     CSN: [id number]  Arrival date & time 05/13/12  1124   First MD Initiated Contact with Patient 05/13/12 1207      Chief Complaint  Patient presents with  . Nail Problem    (Consider location/radiation/quality/duration/timing/severity/associated sxs/prior treatment) Patient is a 78 y.o. male presenting with hand pain. The history is provided by the patient. No language interpreter was used.  Hand Pain This is a new problem. The current episode started yesterday. The problem occurs constantly. Nothing aggravates the symptoms. He has tried nothing for the symptoms. The treatment provided mild relief.  Pt complains of swelling to right middle finger  Past Medical History  Diagnosis Date  . Atrial fibrillation 07/13/2006  . CARDIOMYOPATHY, PRIMARY, DILATED 08/07/2009  . COLON CANCER, HX OF 07/13/2006  . CONGESTIVE HEART FAILURE 07/13/2006  . CORONARY ARTERY DISEASE 07/13/2006  . PROSTATE CANCER, HX OF 07/13/2006  . RENAL DISEASE, CHRONIC, STAGE III 04/18/2009  . SKIN CANCER, HX OF 04/18/2009  . SLEEP APNEA 11/01/2008  . Hypertension   . Hyperlipidemia   . Diabetes mellitus   . Hyperkalemia   . Arthritis   . Congestive heart failure, unspecified   . Cancer     skin, colon, prostate    Past Surgical History  Procedure Date  . Colectomy   . Prostatectomy   . Penile prosthesis placement   . Removed prosthetic eye   . Ptca     stent placed  . Total knee arthroplasty   . St jude unify     St. Jude single chamber defibrillator 07/19/09 (Dr. Graciela Husbands)  . Pacemaker insertion 2010  . Av fistula placement 03/30/2012    Procedure: ARTERIOVENOUS (AV) FISTULA CREATION;  Surgeon: Chuck Hint, MD;  Location: [facility] OR;  Service: Vascular;  Laterality: Right;  Creation of BrachioCephalic Fistula Right arm    Family History  Problem Relation Age of Onset  . Heart disease Father   . Cancer Father   . Throat cancer Mother   . Cancer Mother   . Heart disease Mother   .  Hypertension Mother     History  Substance Use Topics  . Smoking status: Former Smoker    Types: Cigarettes    Quit date: 09/22/1966  . Smokeless tobacco: Never Used  . Alcohol Use: 0.0 - 0.5 oz/week    0-1 drink(s) per week     occasional      Review of Systems  All other systems reviewed and are negative.    Allergies  Prednisone  Home Medications   Current Outpatient Rx  Name Route Sig Dispense Refill  . ASPIRIN 81 MG PO TABS Oral Take 81 mg by mouth daily.      [name] CALCITRIOL 0.25 MCG PO CAPS Oral Take 0.25 mcg by mouth daily.     . CEPHALEXIN 500 MG PO CAPS Oral Take 1 capsule (500 mg total) by mouth 4 (four) times daily. 20 capsule 0  . DIGOXIN 0.125 MG PO TABS Oral Take 1 tablet (125 mcg total) by mouth daily. 30 tablet 0  . FUROSEMIDE 80 MG PO TABS Oral Take 1 tablet (80 mg total) by mouth daily. 30 tablet 0  . GLIPIZIDE 5 MG PO TABS Oral Take 5 mg by mouth 2 (two) times daily before a meal.    . GLUCOSE BLOOD VI STRP Other 1 each by Other route as needed. Use as instructed     . HYDRALAZINE HCL 25 MG PO TABS Oral Take 1 tablet (  25 mg total) by mouth 3 (three) times daily. 90 tablet 0  . INSULIN LISPRO PROT & LISPRO (75-25) 100 UNIT/ML [place] SUSP Subcutaneous Inject 40 Units into the skin 2 (two) times daily with a meal. Or as directed 30 mL 3  . ISOSORBIDE MONONITRATE ER 30 MG PO TB24 Oral Take 0.5 tablets (15 mg total) by mouth daily. 90 tablet 3  . METOPROLOL SUCCINATE ER 100 MG PO TB24  TAKE ONE TABLET BY MOUTH TWICE DAILY 180 tablet 4  . NITROGLYCERIN 0.4 MG SL SUBL Sublingual Place 1 tablet (0.4 mg total) under the tongue every 5 (five) minutes as needed. 50 tablet 3  . MELATONIN PO Oral Take 1 tablet by mouth daily.    [name] POTASSIUM CHLORIDE CRYS ER 20 MEQ PO TBCR Oral Take 40 mEq by mouth daily.     [name] VITAMIN D (ERGOCALCIFEROL) 50000 UNITS PO CAPS Oral Take 50,000 Units by mouth every 7 (seven) days.     . WARFARIN SODIUM 5 MG PO TABS Oral Take 5 mg by mouth daily.  mon wed fri and sat  takes 5 mg and 2.5 mg all other days      BP 164/76  Pulse 70  Temp 97.7 F (36.5 C) (Oral)  Resp 16  Ht 6' (1.829 m)  Wt 202 lb (91.627 kg)  BMI 27.40 kg/m2  SpO2 98%  Physical Exam  Nursing note and vitals reviewed. Constitutional: He is oriented to person, place, and time.  Musculoskeletal: He exhibits tenderness.       Swollen right 3rd finger at end of nail bed  Neurological: He is alert and oriented to person, place, and time. He has normal reflexes.  Skin: Skin is warm.  Psychiatric: He has a normal mood and affect.    ED Course  Procedures (including critical care time)  Labs Reviewed - No data to display No results found.   1. Paronychia       MDM  rx for keflex.  Pt advised to recheck in 2 day       Lonia Skinner [place], [place] 05/13/12 1249

## 2012-05-13 NOTE — Assessment & Plan Note (Signed)
Rate controlled on medical therapy.

## 2012-05-13 NOTE — ED Notes (Signed)
Pain, swelling, and erythema to right middle nail bed.

## 2012-05-13 NOTE — Assessment & Plan Note (Signed)
No recurrent angina 

## 2012-05-13 NOTE — Patient Instructions (Addendum)
Your physician recommends that you schedule a follow-up appointment in: 3 MONTHS  Please proceed to the ER at [facility] for further evaluation of infection in finger.  Your physician recommends that you continue on your current medications as directed. Please refer to the Current Medication list given to you today.

## 2012-05-17 ENCOUNTER — Encounter: Payer: Self-pay | Admitting: Family

## 2012-05-17 ENCOUNTER — Ambulatory Visit (INDEPENDENT_AMBULATORY_CARE_PROVIDER_SITE_OTHER): Payer: Medicare Other | Admitting: Family

## 2012-05-17 VITALS — BP 138/72 | Temp 98.1°F | Wt 202.0 lb

## 2012-05-17 DIAGNOSIS — Z79899 Other long term (current) drug therapy: Secondary | ICD-10-CM

## 2012-05-17 DIAGNOSIS — IMO0002 Reserved for concepts with insufficient information to code with codable children: Secondary | ICD-10-CM

## 2012-05-17 NOTE — Patient Instructions (Addendum)
Hold tomorrow dose. Then Same dose, 5mg  on Mondays,wednesdays and  fridays. Then 2.5mg  on other days ,check in 3 weeks.    Latest dosing instructions   Total Glynis Smiles Tue Wed Thu Fri Sat   25 2.5 mg 5 mg 2.5 mg 5 mg 2.5 mg 5 mg 2.5 mg    (5 mg0.5) (5 mg1) (5 mg0.5) (5 mg1) (5 mg0.5) (5 mg1) (5 mg0.5)

## 2012-05-17 NOTE — Progress Notes (Signed)
Subjective:    Patient ID: [name] [name]., male    DOB: Mar 15, 1931, 78 y.o.   MRN: [id number]  HPI 78 year old WM is in today as a hospital follow-up. He was seen for a paronychia. He is much better today. Denies any concerns. Patient would like to have his INR checked.    Review of Systems  Constitutional: Negative.   Respiratory: Negative.   Cardiovascular: Negative.   Skin: Negative.        Healing finger infection  Psychiatric/Behavioral: Negative.    Past Medical History  Diagnosis Date  . Atrial fibrillation 07/13/2006  . CARDIOMYOPATHY, PRIMARY, DILATED 08/07/2009  . COLON CANCER, HX OF 07/13/2006  . CONGESTIVE HEART FAILURE 07/13/2006  . CORONARY ARTERY DISEASE 07/13/2006  . PROSTATE CANCER, HX OF 07/13/2006  . RENAL DISEASE, CHRONIC, STAGE III 04/18/2009  . SKIN CANCER, HX OF 04/18/2009  . SLEEP APNEA 11/01/2008  . Hypertension   . Hyperlipidemia   . Diabetes mellitus   . Hyperkalemia   . Arthritis   . Congestive heart failure, unspecified   . Cancer     skin, colon, prostate    History   Social History  . Marital Status: Married    Spouse Name: N/A    Number of Children: N/A  . Years of Education: N/A   Occupational History  . Not on file.   Social History Main Topics  . Smoking status: Former Smoker    Types: Cigarettes    Quit date: 09/22/1966  . Smokeless tobacco: Never Used  . Alcohol Use: 0.0 - 0.5 oz/week    0-1 drink(s) per week     occasional  . Drug Use: No  . Sexually Active: Not on file   Other Topics Concern  . Not on file   Social History Narrative  . No narrative on file    Past Surgical History  Procedure Date  . Colectomy   . Prostatectomy   . Penile prosthesis placement   . Removed prosthetic eye   . Ptca     stent placed  . Total knee arthroplasty   . St jude unify     St. Jude single chamber defibrillator 07/19/09 (Dr. Graciela Husbands)  . Pacemaker insertion 2010  . Av fistula placement 03/30/2012    Procedure:  ARTERIOVENOUS (AV) FISTULA CREATION;  Surgeon: Chuck Hint, MD;  Location: [facility] OR;  Service: Vascular;  Laterality: Right;  Creation of BrachioCephalic Fistula Right arm    Family History  Problem Relation Age of Onset  . Heart disease Father   . Cancer Father   . Throat cancer Mother   . Cancer Mother   . Heart disease Mother   . Hypertension Mother     Allergies  Allergen Reactions  . Prednisone Other (See Comments)    Raised blood sugar    Current Outpatient Prescriptions on File Prior to Visit  Medication Sig Dispense Refill  . aspirin 81 MG tablet Take 81 mg by mouth daily.        . calcitRIOL (ROCALTROL) 0.25 MCG capsule Take 0.25 mcg by mouth daily.       . cephALEXin (KEFLEX) 500 MG capsule Take 1 capsule (500 mg total) by mouth 4 (four) times daily.  20 capsule  0  . digoxin (LANOXIN) 0.125 MG tablet Take 1 tablet (125 mcg total) by mouth daily.  30 tablet  0  . furosemide (LASIX) 80 MG tablet Take 1 tablet (80 mg total) by mouth daily.  30  tablet  0  . glipiZIDE (GLUCOTROL) 5 MG tablet Take 5 mg by mouth 2 (two) times daily before a meal.      . glucose blood (FREESTYLE TEST STRIPS) test strip 1 each by Other route as needed. Use as instructed       . hydrALAZINE (APRESOLINE) 25 MG tablet Take 1 tablet (25 mg total) by mouth 3 (three) times daily.  90 tablet  0  . insulin lispro protamine-insulin lispro (HUMALOG 75/25) (75-25) 100 UNIT/ML SUSP Inject 40 Units into the skin 2 (two) times daily with a meal. Or as directed  30 mL  3  . isosorbide mononitrate (IMDUR) 30 MG 24 hr tablet Take 0.5 tablets (15 mg total) by mouth daily.  90 tablet  3  . metoprolol succinate (TOPROL-XL) 100 MG 24 hr tablet TAKE ONE TABLET BY MOUTH TWICE DAILY  180 tablet  4  . nitroGLYCERIN (NITROSTAT) 0.4 MG SL tablet Place 1 tablet (0.4 mg total) under the tongue every 5 (five) minutes as needed.  50 tablet  3  . Nutritional Supplements (MELATONIN PO) Take 1 tablet by mouth daily.      .  potassium chloride SA (K-DUR,KLOR-CON) 20 MEQ tablet Take 40 mEq by mouth daily.       . Vitamin D, Ergocalciferol, (DRISDOL) 50000 UNITS CAPS Take 50,000 Units by mouth every 7 (seven) days.       [name] warfarin (COUMADIN) 5 MG tablet Take 5 mg by mouth daily. mon wed fri and sat  takes 5 mg and 2.5 mg all other days      . DISCONTD: insulin lispro protamine-insulin lispro (HUMALOG MIX 75/25 [place]) (75-25) 100 UNIT/ML SUSP Inject 20 Units into the skin 2 (two) times daily with a meal.  45 mL  2    BP 138/72  Temp 98.1 F (36.7 C) (Oral)  Wt 202 lb (91.627 kg)chart    Objective:   Physical Exam  Constitutional: He is oriented to person, place, and time. He appears well-developed and well-nourished.  Cardiovascular: Normal rate, regular rhythm and normal heart sounds.   Pulmonary/Chest: Effort normal and breath sounds normal.  Neurological: He is alert and oriented to person, place, and time.  Skin: Skin is warm and dry.       Healing paronychia to the right middle finger.   Psychiatric: He has a normal mood and affect.          Assessment & Plan:  Assessment: Paronychia-healing, High risk medication usage  Plan: Recheck INR in 3 weeks. Continue Cephalexin x 7 days. See PCP as scheduled.

## 2012-05-18 ENCOUNTER — Encounter: Payer: Medicare Other | Admitting: Family

## 2012-05-31 ENCOUNTER — Encounter: Payer: Self-pay | Admitting: *Deleted

## 2012-06-03 ENCOUNTER — Ambulatory Visit (INDEPENDENT_AMBULATORY_CARE_PROVIDER_SITE_OTHER): Payer: Medicare Other | Admitting: Internal Medicine

## 2012-06-03 ENCOUNTER — Encounter: Payer: Self-pay | Admitting: Internal Medicine

## 2012-06-03 VITALS — BP 132/78 | Temp 98.3°F | Wt 204.0 lb

## 2012-06-03 DIAGNOSIS — I4891 Unspecified atrial fibrillation: Secondary | ICD-10-CM

## 2012-06-03 DIAGNOSIS — Z23 Encounter for immunization: Secondary | ICD-10-CM

## 2012-06-03 DIAGNOSIS — M25539 Pain in unspecified wrist: Secondary | ICD-10-CM

## 2012-06-03 LAB — POCT INR: INR: 2.6

## 2012-06-03 MED ORDER — INSULIN LISPRO PROT & LISPRO (75-25 MIX) 100 UNIT/ML ~~LOC~~ SUSP: 40.0000 [IU] | Freq: Two times a day (BID) | SUBCUTANEOUS | Status: DC

## 2012-06-03 NOTE — Patient Instructions (Signed)
  Latest dosing instructions   Total Sun Mon Tue Wed Thu Fri Sat   25 2.5 mg 5 mg 2.5 mg 5 mg 2.5 mg 5 mg 2.5 mg    (5 mg0.5) (5 mg1) (5 mg0.5) (5 mg1) (5 mg0.5) (5 mg1) (5 mg0.5)        

## 2012-06-05 NOTE — Progress Notes (Signed)
Patient ID: [name] [name]., male   DOB: March 03, 1931, 78 y.o.   MRN: [id number] Right wrist pain- duration: weeks Only painful with certain positions Pain is short-lived  Reviewed meds, pmh  Exam:  NAD Both wrists with limited flexion and extension-- no pain No effusion, no erythema.  A/p- wrist pain-- suspect tendinitis- No treatment

## 2012-06-07 ENCOUNTER — Ambulatory Visit (INDEPENDENT_AMBULATORY_CARE_PROVIDER_SITE_OTHER): Payer: Medicare Other | Admitting: *Deleted

## 2012-06-07 DIAGNOSIS — I428 Other cardiomyopathies: Secondary | ICD-10-CM

## 2012-06-07 DIAGNOSIS — I509 Heart failure, unspecified: Secondary | ICD-10-CM

## 2012-06-07 DIAGNOSIS — I4891 Unspecified atrial fibrillation: Secondary | ICD-10-CM

## 2012-06-07 LAB — ICD DEVICE OBSERVATION
ATRIAL PACING ICD: 0 pct
DEVICE MODEL ICD: 595859
FVT: 0
LV LEAD IMPEDENCE ICD: 487.5 Ohm
LV LEAD THRESHOLD: 0.375 V
PACEART VT: 0
RV LEAD THRESHOLD: 0.875 V
TOT-0009: 0
TOT-0010: 9
TZAT-0001SLOWVT: 1
TZAT-0004SLOWVT: 8
TZAT-0012SLOWVT: 200 ms
TZAT-0013SLOWVT: 3
TZON-0004SLOWVT: 40
TZST-0001SLOWVT: 3
TZST-0001SLOWVT: 5
TZST-0003SLOWVT: 30 J
TZST-0003SLOWVT: 40 J
VENTRICULAR PACING ICD: 79 pct
VF: 0

## 2012-06-07 NOTE — Progress Notes (Signed)
defib check in clinic  

## 2012-06-08 ENCOUNTER — Encounter: Payer: Medicare Other | Admitting: Family

## 2012-06-16 ENCOUNTER — Encounter: Payer: Self-pay | Admitting: Gastroenterology

## 2012-06-21 ENCOUNTER — Encounter: Payer: Self-pay | Admitting: Gastroenterology

## 2012-06-22 LAB — HM DIABETES FOOT EXAM

## 2012-07-06 ENCOUNTER — Ambulatory Visit (INDEPENDENT_AMBULATORY_CARE_PROVIDER_SITE_OTHER): Payer: Medicare Other | Admitting: Family

## 2012-07-06 ENCOUNTER — Encounter: Payer: Self-pay | Admitting: Internal Medicine

## 2012-07-06 DIAGNOSIS — Z7901 Long term (current) use of anticoagulants: Secondary | ICD-10-CM

## 2012-07-06 NOTE — Patient Instructions (Addendum)
Tomorrow, do not take any coumadin. Then Same dose, 5mg  on Mondays,wednesdays and  fridays. Then 2.5mg  on other days, check in 3 weeks.    Latest dosing instructions   Total [name] [name] Tue Wed Thu Fri Sat   25 2.5 mg 5 mg 2.5 mg 5 mg 2.5 mg 5 mg 2.5 mg    (5 mg0.5) (5 mg1) (5 mg0.5) (5 mg1) (5 mg0.5) (5 mg1) (5 mg0.5)

## 2012-07-08 ENCOUNTER — Encounter: Payer: Self-pay | Admitting: *Deleted

## 2012-07-13 ENCOUNTER — Encounter: Payer: Self-pay | Admitting: Gastroenterology

## 2012-07-13 ENCOUNTER — Ambulatory Visit (INDEPENDENT_AMBULATORY_CARE_PROVIDER_SITE_OTHER): Payer: Medicare Other | Admitting: Gastroenterology

## 2012-07-13 VITALS — BP 136/60 | HR 76 | Ht 70.75 in | Wt 204.1 lb

## 2012-07-13 DIAGNOSIS — Z1211 Encounter for screening for malignant neoplasm of colon: Secondary | ICD-10-CM

## 2012-07-13 DIAGNOSIS — Z85048 Personal history of other malignant neoplasm of rectum, rectosigmoid junction, and anus: Secondary | ICD-10-CM

## 2012-07-13 DIAGNOSIS — K5901 Slow transit constipation: Secondary | ICD-10-CM

## 2012-07-13 NOTE — Patient Instructions (Addendum)
Your physician has requested that you go to the basement for the following lab work before leaving today: IFOB CC: Dr Birdie Sons

## 2012-07-13 NOTE — Progress Notes (Signed)
History of Present Illness:  This is a very pleasant 78 year old Caucasian male diabetic on insulin who is chronically anticoagulated because of coronary artery disease and atrial fibrillation, he now presents for possible followup colonoscopy. He had colon cancer with left sigmoid resection in 1999. Colonoscopy 4 years ago was unremarkable except for small adenoma. He currently is asymptomatic and denies abdominal pain, bowel irregularity, upper GI, or hepatobiliary problems. His appetite is good and his weight is stable. He specifically denies melena or hematochezia. He is on multiple medications listed and reviewed his chart. His wife was recently diagnosed with lung cancer, and is undergoing chemotherapy and radiation therapy. He is, or constipated since his wife has been sick because of poor dietary compliance. This has resulted in mild constipation but no rectal pain or rectal bleeding. Extensive review today of his records, physicians notes, previous exams, medications et Karie Soda.  I have reviewed this patient's present history, medical and surgical past history, allergies and medications.     ROS: The remainder of the 10 point ROS is negative... recently evaluated by [facility] for proteinuria and chronic renal disease associated with his diabetes. Creatinine is approximately 3.0. Patient also has hypertension, secondary hyperparathyroidism, coronary artery disease with a pacemaker, history of cardiomyopathy with ejection fraction approximately 30%, hyperlipidemia, prior surgery for prostate cancer, and previous surgery for subclavian steal syndrome. Lab review shows a hemoglobin of 14.6 and no evidence of anemia.     Physical Exam: Blood pressure 136/60, pulse 76 and regular, and weight 204 pounds with BMI of 28.67. General well developed well nourished patient in no acute distress, appearing their stated age Eyes PERRLA, no icterus, fundoscopic exam per opthamologist Skin no  lesions noted... he has multiple senile keratoses and recent treatment of several of these lesions with scab formation. Neck supple, no adenopathy, no thyroid enlargement, no tenderness Chest clear to percussion and auscultation Heart no significant murmurs, gallops or rubs noted... irregular irregular heart rate without well-controlled rhythm. Abdomen no hepatosplenomegaly masses or tenderness, BS normal.  Rectal inspection normal no fissures, or fistulae noted.  No masses or tenderness on digital exam. Stool guaiac negative. Extremities no acute joint lesions, edema, phlebitis or evidence of cellulitis. Neurologic patient oriented x 3, cranial nerves intact, no focal neurologic deficits noted. Psychological mental status normal and normal affect.  Assessment and plan: This patient is extremely poor candidate for any type of sedation or endoscopic procedure with his multiple problems and depressed ejection fraction. Also he is chronically and coagulated on Coumadin therapy, has advancing renal insufficiency, and insulin-dependent diabetes. I do not think it colonoscopy is indicated at this time because of his age, comorbid problems, and lack of GI symptomatology despite his history of colon cancer in 1999. He had successful surgery at that time without evidence of metastases. I would obtain IFOB stool cards, and if these are positive perform CT colonoscopy. Otherwise he should continue his medical followup with Dr. Cato Mulligan as planned. Because of his constipation I have placed him on daily Metamucil with liberal by mouth fluids.  Encounter Diagnosis  Name Primary?  . Screening for colon cancer Yes

## 2012-07-27 ENCOUNTER — Encounter: Payer: Medicare Other | Admitting: Family

## 2012-07-28 ENCOUNTER — Other Ambulatory Visit: Payer: Self-pay | Admitting: Internal Medicine

## 2012-08-03 ENCOUNTER — Ambulatory Visit (INDEPENDENT_AMBULATORY_CARE_PROVIDER_SITE_OTHER): Payer: Medicare Other | Admitting: Family

## 2012-08-03 ENCOUNTER — Ambulatory Visit (INDEPENDENT_AMBULATORY_CARE_PROVIDER_SITE_OTHER): Payer: Medicare Other | Admitting: Internal Medicine

## 2012-08-03 ENCOUNTER — Encounter: Payer: Self-pay | Admitting: Internal Medicine

## 2012-08-03 VITALS — BP 152/74 | HR 76 | Temp 98.3°F | Wt 203.0 lb

## 2012-08-03 DIAGNOSIS — E1329 Other specified diabetes mellitus with other diabetic kidney complication: Secondary | ICD-10-CM

## 2012-08-03 DIAGNOSIS — E0821 Diabetes mellitus due to underlying condition with diabetic nephropathy: Secondary | ICD-10-CM

## 2012-08-03 DIAGNOSIS — Z7901 Long term (current) use of anticoagulants: Secondary | ICD-10-CM

## 2012-08-03 DIAGNOSIS — N058 Unspecified nephritic syndrome with other morphologic changes: Secondary | ICD-10-CM

## 2012-08-03 DIAGNOSIS — I428 Other cardiomyopathies: Secondary | ICD-10-CM

## 2012-08-03 DIAGNOSIS — E119 Type 2 diabetes mellitus without complications: Secondary | ICD-10-CM

## 2012-08-03 DIAGNOSIS — I4891 Unspecified atrial fibrillation: Secondary | ICD-10-CM

## 2012-08-03 DIAGNOSIS — E785 Hyperlipidemia, unspecified: Secondary | ICD-10-CM

## 2012-08-03 LAB — LIPID PANEL
Cholesterol: 173 mg/dL (ref 0–200)
Triglycerides: 209 mg/dL — ABNORMAL HIGH (ref 0.0–149.0)
VLDL: 41.8 mg/dL — ABNORMAL HIGH (ref 0.0–40.0)

## 2012-08-03 LAB — BASIC METABOLIC PANEL
BUN: 43 mg/dL — ABNORMAL HIGH (ref 6–23)
Calcium: 9 mg/dL (ref 8.4–10.5)
Creatinine, Ser: 3 mg/dL — ABNORMAL HIGH (ref 0.4–1.5)
GFR: 21.87 mL/min — ABNORMAL LOW (ref >60.00)
Glucose, Bld: 265 mg/dL — ABNORMAL HIGH (ref 70–99)

## 2012-08-03 LAB — POCT INR: INR: 2.1

## 2012-08-03 LAB — HEPATIC FUNCTION PANEL
AST: 24 U/L (ref 0–37)
Albumin: 3.7 g/dL (ref 3.5–5.2)

## 2012-08-03 LAB — MICROALBUMIN / CREATININE URINE RATIO: Microalb Creat Ratio: 40 mg/g — ABNORMAL HIGH (ref 0.0–30.0)

## 2012-08-03 LAB — LDL CHOLESTEROL, DIRECT: Direct LDL: 102.7 mg/dL

## 2012-08-03 NOTE — Assessment & Plan Note (Signed)
Check labs today-- he has known DM nephropathy

## 2012-08-03 NOTE — Patient Instructions (Addendum)
Continue same dose, 5mg on Mondays,wednesdays and  fridays. Then 2.5mg on other days, check in 4 weeks    Latest dosing instructions   Total Sun Mon Tue Wed Thu Fri Sat   25 2.5 mg 5 mg 2.5 mg 5 mg 2.5 mg 5 mg 2.5 mg    (5 mg0.5) (5 mg1) (5 mg0.5) (5 mg1) (5 mg0.5) (5 mg1) (5 mg0.5)        

## 2012-08-03 NOTE — Assessment & Plan Note (Signed)
Rate controlled- continue same meds Continue warfarin

## 2012-08-03 NOTE — Assessment & Plan Note (Signed)
Clinically controlled Continue same meds

## 2012-08-03 NOTE — Progress Notes (Signed)
Patient ID: [name] Ging., male   DOB: 10/26/1930, 78 y.o.   MRN: [id number] AFIB-- no sxs, tolerating warfarin without bleeding  DM-- home cbgs- 110-150, tolerating insulin  HTN-- not monitored at home  CHF-- no trouble with breathing  Past Medical History  Diagnosis Date  . Atrial fibrillation   . CARDIOMYOPATHY, PRIMARY, DILATED   . COLON CANCER, HX OF   . CONGESTIVE HEART FAILURE   . CORONARY ARTERY DISEASE   . PROSTATE CANCER, HX OF   . RENAL DISEASE, CHRONIC, STAGE III   . SKIN CANCER, HX OF   . SLEEP APNEA   . Hypertension   . Hyperlipidemia   . Diabetes mellitus   . Hyperkalemia   . Arthritis   . Osteoarthritis   . Tubular adenoma of colon   . Secondary hyperparathyroidism     History   Social History  . Marital Status: Married    Spouse Name: N/A    Number of Children: 4  . Years of Education: N/A   Occupational History  . retired    Social History Main Topics  . Smoking status: Former Smoker    Types: Cigarettes    Quit date: 09/22/1966  . Smokeless tobacco: Never Used  . Alcohol Use: 0.0 - 0.5 oz/week    0-1 drink(s) per week     Comment: occasional  . Drug Use: No  . Sexually Active: Not on file   Other Topics Concern  . Not on file   Social History Narrative  . No narrative on file    Past Surgical History  Procedure Date  . Colectomy   . Prostatectomy   . Penile prosthesis placement   . Removed prosthetic eye   . Ptca     stent placed  . Total knee arthroplasty 2008    right  . St jude unify     St. Jude single chamber defibrillator 07/19/09 (Dr. Graciela Husbands)  . Pacemaker insertion 2010  . Av fistula placement 03/30/2012    Procedure: ARTERIOVENOUS (AV) FISTULA CREATION;  Surgeon: Chuck Hint, MD;  Location: [facility] OR;  Service: Vascular;  Laterality: Right;  Creation of BrachioCephalic Fistula Right arm    Family History  Problem Relation Age of Onset  . Heart disease Father   . Throat cancer Father   . Throat cancer  Mother   . Heart disease Mother   . Hypertension Mother     Allergies  Allergen Reactions  . Prednisone Other (See Comments)    Raised blood sugar    Current Outpatient Prescriptions on File Prior to Visit  Medication Sig Dispense Refill  . aspirin 81 MG tablet Take 81 mg by mouth daily.        . calcitRIOL (ROCALTROL) 0.25 MCG capsule Take 0.25 mcg by mouth daily.       . digoxin (LANOXIN) 0.125 MG tablet Take 1 tablet (125 mcg total) by mouth daily.  30 tablet  0  . furosemide (LASIX) 80 MG tablet Take 1 tablet (80 mg total) by mouth daily.  30 tablet  0  . glipiZIDE (GLUCOTROL) 5 MG tablet Take 2.5 mg by mouth 2 (two) times daily before a meal.       . glucose blood (FREESTYLE TEST STRIPS) test strip 1 each by Other route as needed. Use as instructed       . hydrALAZINE (APRESOLINE) 25 MG tablet Take 1 tablet (25 mg total) by mouth 3 (three) times daily.  90 tablet  0  .  insulin lispro protamine-insulin lispro (HUMALOG 75/25) (75-25) 100 UNIT/ML SUSP Inject 40 Units into the skin 2 (two) times daily with a meal. Or as directed  90 mL  3  . isosorbide mononitrate (IMDUR) 30 MG 24 hr tablet Take 0.5 tablets (15 mg total) by mouth daily.  90 tablet  3  . metoprolol succinate (TOPROL-XL) 100 MG 24 hr tablet TAKE ONE TABLET BY MOUTH TWICE DAILY  180 tablet  4  . nitroGLYCERIN (NITROSTAT) 0.4 MG SL tablet Place 1 tablet (0.4 mg total) under the tongue every 5 (five) minutes as needed.  50 tablet  3  . Nutritional Supplements (MELATONIN PO) Take 1 tablet by mouth daily.      . potassium chloride SA (K-DUR,KLOR-CON) 20 MEQ tablet Take 40 mEq by mouth daily.       [name] warfarin (COUMADIN) 5 MG tablet Take 5 mg by mouth daily. mon wed fri and takes 5 mg and 2.5 mg all other days      . [DISCONTINUED] digoxin (LANOXIN) 0.125 MG tablet TAKE ONE TABLET BY MOUTH ONE TIME DAILY  90 tablet  0     patient denies chest pain, shortness of breath, orthopnea. Denies lower extremity edema, abdominal pain,  change in appetite, change in bowel movements. Patient denies rashes, musculoskeletal complaints. No other specific complaints in a complete review of systems.   BP 152/74  Pulse 76  Temp 98.3 F (36.8 C) (Oral)  Wt 203 lb (92.08 kg)  well-developed well-nourished male in no acute distress. HEENT exam atraumatic, normocephalic, neck supple without jugular venous distention. Chest clear to auscultation cardiac exam S1-S2 are irregular. Abdominal exam overweight with bowel sounds, soft and nontender. Extremities no edema. Neurologic exam is alert with a normal gait.

## 2012-08-03 NOTE — Addendum Note (Signed)
Addended by: Rita Ohara R on: 08/03/2012 09:30 AM   Modules accepted: Orders

## 2012-08-05 ENCOUNTER — Other Ambulatory Visit (INDEPENDENT_AMBULATORY_CARE_PROVIDER_SITE_OTHER): Payer: Medicare Other

## 2012-08-05 DIAGNOSIS — Z1211 Encounter for screening for malignant neoplasm of colon: Secondary | ICD-10-CM

## 2012-08-11 ENCOUNTER — Telehealth: Payer: Self-pay | Admitting: Internal Medicine

## 2012-08-11 NOTE — Telephone Encounter (Signed)
Told pt lab results.  Apologized to pt for the misunderstanding.  My voicemail message stated that he seen the results in mychart.  Pt was very polite and understanding.  He will send me a message via mychart when he gets home on what medicines he needs refills on

## 2012-08-11 NOTE — Telephone Encounter (Signed)
Opened in error

## 2012-08-11 NOTE — Telephone Encounter (Signed)
Pt called and is req call back asap today. Pt has been waiting for over a wk for lab results. Pt has left vm for nurse, but not returned call. Pt has viewed results in mychart, but does not understand them. Req call back today.

## 2012-08-17 ENCOUNTER — Ambulatory Visit: Payer: Medicare Other | Admitting: Cardiology

## 2012-08-31 ENCOUNTER — Ambulatory Visit (INDEPENDENT_AMBULATORY_CARE_PROVIDER_SITE_OTHER): Payer: Medicare Other | Admitting: Family

## 2012-08-31 DIAGNOSIS — Z7901 Long term (current) use of anticoagulants: Secondary | ICD-10-CM

## 2012-08-31 LAB — POCT INR: INR: 2

## 2012-08-31 NOTE — Patient Instructions (Signed)
Continue same dose, 5mg  on Mondays,wednesdays and  fridays. Then 2.5mg  on other days, check in 4 weeks    Latest dosing instructions   Total Sun Mon Tue Wed Thu Fri Sat   25 2.5 mg 5 mg 2.5 mg 5 mg 2.5 mg 5 mg 2.5 mg    (5 mg0.5) (5 mg1) (5 mg0.5) (5 mg1) (5 mg0.5) (5 mg1) (5 mg0.5)

## 2012-09-07 ENCOUNTER — Encounter: Payer: Self-pay | Admitting: Internal Medicine

## 2012-09-07 ENCOUNTER — Ambulatory Visit (INDEPENDENT_AMBULATORY_CARE_PROVIDER_SITE_OTHER): Payer: Medicare Other | Admitting: Internal Medicine

## 2012-09-07 VITALS — BP 178/87 | HR 71 | Ht 71.0 in | Wt 202.0 lb

## 2012-09-07 DIAGNOSIS — N184 Chronic kidney disease, stage 4 (severe): Secondary | ICD-10-CM | POA: Insufficient documentation

## 2012-09-07 DIAGNOSIS — I4891 Unspecified atrial fibrillation: Secondary | ICD-10-CM

## 2012-09-07 DIAGNOSIS — I509 Heart failure, unspecified: Secondary | ICD-10-CM

## 2012-09-07 DIAGNOSIS — Z95810 Presence of automatic (implantable) cardiac defibrillator: Secondary | ICD-10-CM

## 2012-09-07 DIAGNOSIS — I428 Other cardiomyopathies: Secondary | ICD-10-CM

## 2012-09-07 LAB — ICD DEVICE OBSERVATION
ATRIAL PACING ICD: 0 pct
CHARGE TIME: 9.3 s
LV LEAD IMPEDENCE ICD: 375 Ohm
TOT-0008: 0
TOT-0009: 0
TZAT-0001SLOWVT: 1
TZAT-0019SLOWVT: 7.5 V
TZAT-0020SLOWVT: 1 ms
TZON-0004SLOWVT: 40
TZON-0005SLOWVT: 6
TZON-0010SLOWVT: 80 ms
TZST-0001SLOWVT: 4
TZST-0003SLOWVT: 30 J
TZST-0003SLOWVT: 40 J
VENTRICULAR PACING ICD: 79 pct
VF: 0

## 2012-09-07 NOTE — Assessment & Plan Note (Signed)
contiune current meds

## 2012-09-07 NOTE — Progress Notes (Signed)
Patient Care Team: Lindley Magnus, MD as PCP - General   HPI  [name] [name]. is a 78 y.o. male   seen in followup for congestive heart failure in the setting of atrial fibrillation with a rapid ventricular response and associated cardiomyopathy with both ischemic and nonischemic components.  He is status post CRT-D implantation with Anticipation of AV junction ablation. However, we were then able to accomplish rate control And ablation was not undertaken.  Cardiac catheterization in OCT 2010 Nonischemic cardiomyopathy with reduced cardiac output 2. Continued patency of the left anterior descending artery at site of previous stenting 3. Scattered noncritical disease ; left ventricular function out of proportion to the extent of coronary disease.  Echo 2011 demonstrated EF 30-35%   The patient denies chest pain, shortness of breath, nocturnal dyspnea, orthopnea or peripheral edema.  There have been no palpitations, lightheadedness or syncope.    His wife is intercurrently diagnosed with stage IV lung cancer. She is doing relatively well   .   Past Medical History  Diagnosis Date  . Atrial fibrillation   . CARDIOMYOPATHY, PRIMARY, DILATED   . COLON CANCER, HX OF   . CONGESTIVE HEART FAILURE   . CORONARY ARTERY DISEASE   . PROSTATE CANCER, HX OF   . RENAL DISEASE, CHRONIC, STAGE III   . SKIN CANCER, HX OF   . SLEEP APNEA   . Hypertension   . Hyperlipidemia   . Diabetes mellitus   . Hyperkalemia   . Arthritis   . Osteoarthritis   . Tubular adenoma of colon   . Secondary hyperparathyroidism     Past Surgical History  Procedure Date  . Colectomy   . Prostatectomy   . Penile prosthesis placement   . Removed prosthetic eye   . Ptca     stent placed  . Total knee arthroplasty 2008    right  . St jude unify     St. Jude single chamber defibrillator 07/19/09 (Dr. Graciela Husbands)  . Pacemaker insertion 2010  . Av fistula placement 03/30/2012    Procedure: ARTERIOVENOUS (AV)  FISTULA CREATION;  Surgeon: Chuck Hint, MD;  Location: [facility] OR;  Service: Vascular;  Laterality: Right;  Creation of BrachioCephalic Fistula Right arm    Current Outpatient Prescriptions  Medication Sig Dispense Refill  . aspirin 81 MG tablet Take 81 mg by mouth daily.        . calcitRIOL (ROCALTROL) 0.25 MCG capsule Take 0.25 mcg by mouth daily.       . digoxin (LANOXIN) 0.125 MG tablet Take 1 tablet (125 mcg total) by mouth daily.  30 tablet  0  . furosemide (LASIX) 80 MG tablet Take 1 tablet (80 mg total) by mouth daily.  30 tablet  0  . glipiZIDE (GLUCOTROL) 5 MG tablet Take 2.5 mg by mouth 2 (two) times daily before a meal.       . glucose blood (FREESTYLE TEST STRIPS) test strip 1 each by Other route as needed. Use as instructed       . hydrALAZINE (APRESOLINE) 25 MG tablet Take 1 tablet (25 mg total) by mouth 3 (three) times daily.  90 tablet  0  . insulin lispro protamine-insulin lispro (HUMALOG 75/25) (75-25) 100 UNIT/ML SUSP Inject 45 Units into the skin 2 (two) times daily with a meal. Or as directed      . isosorbide mononitrate (IMDUR) 30 MG 24 hr tablet Take 0.5 tablets (15 mg total) by mouth daily.  90 tablet  3  . metoprolol succinate (TOPROL-XL) 100 MG 24 hr tablet TAKE ONE TABLET BY MOUTH TWICE DAILY  180 tablet  4  . nitroGLYCERIN (NITROSTAT) 0.4 MG SL tablet Place 1 tablet (0.4 mg total) under the tongue every 5 (five) minutes as needed.  50 tablet  3  . Nutritional Supplements (MELATONIN PO) Take 1 tablet by mouth daily.      . potassium chloride SA (K-DUR,KLOR-CON) 20 MEQ tablet Take 40 mEq by mouth daily.       [name] warfarin (COUMADIN) 5 MG tablet Take 5 mg by mouth daily. mon wed fri and takes 5 mg and 2.5 mg all other days        Allergies  Allergen Reactions  . Prednisone Other (See Comments)    Raised blood sugar    Review of Systems negative except from HPI and PMH  Physical Exam BP 178/87  Pulse 71  Ht 5\' 11"  (1.803 m)  Wt 202 lb (91.627 kg)  BMI  28.17 kg/m2 Well developed and nourished in no acute distress HENT normal Neck supple with JVP-flat Clear Regular rate and rhythm, no murmurs or gallops Abd-soft with active BS No Clubbing cyanosis edema Skin-warm and dry A & Oriented  Grossly normal sensory and motor function     Assessment and  Plan

## 2012-09-07 NOTE — Assessment & Plan Note (Signed)
None intercurrently

## 2012-09-07 NOTE — Assessment & Plan Note (Signed)
The patient's device was interrogated.  The information was reviewed. No changes were made in the programming.    

## 2012-09-07 NOTE — Patient Instructions (Signed)
Your physician recommends that you schedule a follow-up appointment in: 3 months with Belenda Cruise and Gunnar Fusi in the device clinic.  Your physician wants you to follow-up in: 1 year with Dr. Graciela Husbands.  You will receive a reminder letter in the mail two months in advance. If you don't receive a letter, please call our office to schedule the follow-up appointment.

## 2012-09-21 ENCOUNTER — Other Ambulatory Visit: Payer: Self-pay | Admitting: Internal Medicine

## 2012-09-28 ENCOUNTER — Ambulatory Visit (INDEPENDENT_AMBULATORY_CARE_PROVIDER_SITE_OTHER): Payer: Medicare Other | Admitting: Cardiology

## 2012-09-28 ENCOUNTER — Encounter: Payer: Self-pay | Admitting: Cardiology

## 2012-09-28 ENCOUNTER — Ambulatory Visit (INDEPENDENT_AMBULATORY_CARE_PROVIDER_SITE_OTHER): Payer: Medicare Other | Admitting: Family

## 2012-09-28 VITALS — BP 150/78 | HR 70 | Ht 72.0 in | Wt 202.0 lb

## 2012-09-28 DIAGNOSIS — I10 Essential (primary) hypertension: Secondary | ICD-10-CM

## 2012-09-28 DIAGNOSIS — I25810 Atherosclerosis of coronary artery bypass graft(s) without angina pectoris: Secondary | ICD-10-CM

## 2012-09-28 DIAGNOSIS — I4891 Unspecified atrial fibrillation: Secondary | ICD-10-CM

## 2012-09-28 DIAGNOSIS — I2510 Atherosclerotic heart disease of native coronary artery without angina pectoris: Secondary | ICD-10-CM

## 2012-09-28 DIAGNOSIS — Z95810 Presence of automatic (implantable) cardiac defibrillator: Secondary | ICD-10-CM

## 2012-09-28 DIAGNOSIS — I428 Other cardiomyopathies: Secondary | ICD-10-CM

## 2012-09-28 NOTE — Assessment & Plan Note (Addendum)
No recurrent angina.  Continues to do well.  Remains on ASA for stent.  Has non DES stent in mid LAD.  Otherwise stable.

## 2012-09-28 NOTE — Assessment & Plan Note (Signed)
Mildly elevated but generally well controlled.

## 2012-09-28 NOTE — Patient Instructions (Addendum)
Your physician recommends that you return for lab work tomorrow morning:  Digoxin Level  Your physician wants you to follow-up in: 4 months with Dr. Excell Seltzer.  You will receive a reminder letter in the mail two months in advance. If you don't receive a letter, please call our office to schedule the follow-up appointment.\

## 2012-09-28 NOTE — Assessment & Plan Note (Addendum)
Has not had an echo in a couple of years, so would be reasonable to consider when seen in follow up in a few months. He is currently class II

## 2012-09-28 NOTE — Assessment & Plan Note (Signed)
Remains on warfarin with chronic af underlying paced rhythm.  Continue current.  Rate is controlled on low dose dig.  Will check dig level as Cr is 3.0

## 2012-09-28 NOTE — Progress Notes (Signed)
HPI:  Patient returns today in a followup visit. From a cardiac standpoint he seems to be stable. He's been taking care of his wife, and she has lung cancer. He has been followed by the nephrologist carefully, and also by Dr. Cato Mulligan. Laboratory studies and get up to date. He's not had a digoxin level. He denies any exertional shortness of breath or chest pain, and his hope is to get back into the pool.  Current Outpatient Prescriptions  Medication Sig Dispense Refill  . aspirin 81 MG tablet Take 81 mg by mouth daily.        . calcitRIOL (ROCALTROL) 0.25 MCG capsule Take 0.25 mcg by mouth daily.       . digoxin (LANOXIN) 0.125 MG tablet TAKE 1 TABLET BY MOUTH DAILY  90 tablet  1  . furosemide (LASIX) 80 MG tablet TAKE 1 TABLET BY MOUTH DAILY  90 tablet  1  . glipiZIDE (GLUCOTROL) 5 MG tablet Take 2.5 mg by mouth 2 (two) times daily before a meal.       . glucose blood (FREESTYLE TEST STRIPS) test strip 1 each by Other route as needed. Use as instructed       . hydrALAZINE (APRESOLINE) 25 MG tablet TAKE 1 TABLET BY MOUTH 3 TIMES A DAY  90 tablet  3  . insulin lispro protamine-insulin lispro (HUMALOG 75/25) (75-25) 100 UNIT/ML SUSP Inject 45 Units into the skin 2 (two) times daily with a meal. Or as directed      . isosorbide mononitrate (IMDUR) 30 MG 24 hr tablet Take 0.5 tablets (15 mg total) by mouth daily.  90 tablet  3  . metoprolol succinate (TOPROL-XL) 100 MG 24 hr tablet TAKE 1 TABLET BY MOUTH TWICE DAILY  180 tablet  1  . nitroGLYCERIN (NITROSTAT) 0.4 MG SL tablet Place 1 tablet (0.4 mg total) under the tongue every 5 (five) minutes as needed.  50 tablet  3  . Nutritional Supplements (MELATONIN PO) Take 1 tablet by mouth daily.      . potassium chloride SA (K-DUR,KLOR-CON) 20 MEQ tablet Take 40 mEq by mouth daily.       [name] warfarin (COUMADIN) 5 MG tablet Take 5 mg by mouth daily. mon wed fri and takes 5 mg and 2.5 mg all other days        Allergies  Allergen Reactions  . Prednisone  Other (See Comments)    Raised blood sugar    Past Medical History  Diagnosis Date  . Atrial fibrillation   . CARDIOMYOPATHY, PRIMARY, DILATED   . COLON CANCER, HX OF   . CONGESTIVE HEART FAILURE   . CORONARY ARTERY DISEASE   . PROSTATE CANCER, HX OF   . RENAL DISEASE, CHRONIC, STAGE III   . SKIN CANCER, HX OF   . SLEEP APNEA   . Hypertension   . Hyperlipidemia   . Diabetes mellitus   . Hyperkalemia   . Arthritis   . Osteoarthritis   . Tubular adenoma of colon   . Secondary hyperparathyroidism     Past Surgical History  Procedure Date  . Colectomy   . Prostatectomy   . Penile prosthesis placement   . Removed prosthetic eye   . Ptca     stent placed  . Total knee arthroplasty 2008    right  . St jude unify     St. Jude single chamber defibrillator 07/19/09 (Dr. Graciela Husbands)  . Pacemaker insertion 2010  . Av fistula placement 03/30/2012  Procedure: ARTERIOVENOUS (AV) FISTULA CREATION;  Surgeon: Chuck Hint, MD;  Location: [facility] OR;  Service: Vascular;  Laterality: Right;  Creation of BrachioCephalic Fistula Right arm    Family History  Problem Relation Age of Onset  . Heart disease Father   . Throat cancer Father   . Throat cancer Mother   . Heart disease Mother   . Hypertension Mother     History   Social History  . Marital Status: Married    Spouse Name: N/A    Number of Children: 4  . Years of Education: N/A   Occupational History  . retired    Social History Main Topics  . Smoking status: Former Smoker    Types: Cigarettes    Quit date: 09/22/1966  . Smokeless tobacco: Never Used  . Alcohol Use: 0.0 - 0.5 oz/week    0-1 drink(s) per week     Comment: occasional  . Drug Use: No  . Sexually Active: Not on file   Other Topics Concern  . Not on file   Social History Narrative  . No narrative on file    ROS: Please see the HPI.  All other systems reviewed and negative.  PHYSICAL EXAM:  BP 150/78  Pulse 70  Ht 6' (1.829 m)  Wt 202  lb (91.627 kg)  BMI 27.40 kg/m2  SpO2 97%  General: Well developed, well nourished, in no acute distress. Head:  Normocephalic and atraumatic. Lungs: Clear to auscultation and percussion. Heart: Normal S1 and S2.  1-2/6 SEM.  No DM.   Pulses: Pulses normal in all 4 extremities. Extremities: No clubbing or cyanosis. No edema. Neurologic: Alert and oriented x 3.  EKG: atrial fib.  Ventricular pacing.    ASSESSMENT AND PLAN:

## 2012-09-28 NOTE — Patient Instructions (Addendum)
Continue same dose, 5mg  on Mondays,wednesdays and  fridays. Then 2.5mg  on other days, check in 6 weeks.    Latest dosing instructions   Total [name] [name] Tue Wed Thu Fri Sat   25 2.5 mg 5 mg 2.5 mg 5 mg 2.5 mg 5 mg 2.5 mg    (5 mg0.5) (5 mg1) (5 mg0.5) (5 mg1) (5 mg0.5) (5 mg1) (5 mg0.5)

## 2012-09-28 NOTE — Assessment & Plan Note (Signed)
Followed by Dr. Klein 

## 2012-09-29 ENCOUNTER — Ambulatory Visit (INDEPENDENT_AMBULATORY_CARE_PROVIDER_SITE_OTHER): Payer: Medicare Other | Admitting: *Deleted

## 2012-09-29 DIAGNOSIS — R0989 Other specified symptoms and signs involving the circulatory and respiratory systems: Secondary | ICD-10-CM

## 2012-09-29 DIAGNOSIS — I2510 Atherosclerotic heart disease of native coronary artery without angina pectoris: Secondary | ICD-10-CM

## 2012-09-29 DIAGNOSIS — I4891 Unspecified atrial fibrillation: Secondary | ICD-10-CM

## 2012-09-29 DIAGNOSIS — I428 Other cardiomyopathies: Secondary | ICD-10-CM

## 2012-10-06 ENCOUNTER — Other Ambulatory Visit: Payer: Self-pay

## 2012-10-06 ENCOUNTER — Other Ambulatory Visit: Payer: Self-pay | Admitting: *Deleted

## 2012-10-06 MED ORDER — ISOSORBIDE MONONITRATE ER 30 MG PO TB24: 15.0000 mg | ORAL_TABLET | Freq: Every day | ORAL | Status: DC

## 2012-10-06 MED ORDER — HYDRALAZINE HCL 25 MG PO TABS: 25.0000 mg | ORAL_TABLET | Freq: Three times a day (TID) | ORAL | Status: DC

## 2012-10-06 MED ORDER — INSULIN LISPRO PROT & LISPRO (75-25 MIX) 100 UNIT/ML ~~LOC~~ SUSP: 45.0000 [IU] | Freq: Two times a day (BID) | SUBCUTANEOUS | Status: DC

## 2012-11-11 ENCOUNTER — Encounter: Payer: Medicare Other | Admitting: Family

## 2012-11-16 ENCOUNTER — Ambulatory Visit (INDEPENDENT_AMBULATORY_CARE_PROVIDER_SITE_OTHER): Payer: Medicare Other | Admitting: Family

## 2012-11-16 DIAGNOSIS — I4891 Unspecified atrial fibrillation: Secondary | ICD-10-CM

## 2012-11-16 NOTE — Patient Instructions (Addendum)
Take an extra 1/2 tab today and tomorrow. Continue same dose, 5mg  on Mondays,wednesdays and  fridays. Then 2.5mg  on other days, check in 3 weeks.  Anticoagulation Dose Instructions as of 11/16/2012     Glynis Smiles Tue Wed Thu Fri Sat   New Dose 2.5 mg 5 mg 2.5 mg 5 mg 2.5 mg 5 mg 2.5 mg    Description       Take an extra 1/2 tab today and tomorrow. Continue same dose, 5mg  on Mondays,wednesdays and  fridays. Then 2.5mg  on other days, check in 3 weeks.

## 2012-11-23 ENCOUNTER — Other Ambulatory Visit: Payer: Self-pay | Admitting: Internal Medicine

## 2012-12-07 ENCOUNTER — Ambulatory Visit (INDEPENDENT_AMBULATORY_CARE_PROVIDER_SITE_OTHER): Payer: Medicare Other | Admitting: Family

## 2012-12-07 DIAGNOSIS — I4891 Unspecified atrial fibrillation: Secondary | ICD-10-CM

## 2012-12-07 NOTE — Patient Instructions (Addendum)
Continue same dose, 5mg  on Mondays,wednesdays and  fridays. Then 2.5mg  on other days, check in 4 weeks.  Anticoagulation Dose Instructions as of 12/07/2012     Glynis Smiles Tue Wed Thu Fri Sat   New Dose 2.5 mg 5 mg 2.5 mg 5 mg 2.5 mg 5 mg 2.5 mg    Description       Continue same dose, 5mg  on Mondays,wednesdays and  fridays. Then 2.5mg  on other days, check in 4 weeks.

## 2012-12-08 ENCOUNTER — Ambulatory Visit (INDEPENDENT_AMBULATORY_CARE_PROVIDER_SITE_OTHER): Payer: Medicare Other | Admitting: *Deleted

## 2012-12-08 ENCOUNTER — Other Ambulatory Visit: Payer: Self-pay

## 2012-12-08 ENCOUNTER — Encounter: Payer: Self-pay | Admitting: Internal Medicine

## 2012-12-08 DIAGNOSIS — I428 Other cardiomyopathies: Secondary | ICD-10-CM

## 2012-12-08 DIAGNOSIS — I509 Heart failure, unspecified: Secondary | ICD-10-CM

## 2012-12-08 LAB — ICD DEVICE OBSERVATION
DEVICE MODEL ICD: 595859
HV IMPEDENCE: 44 Ohm
MODE SWITCH EPISODES: 0
PACEART VT: 0
RV LEAD AMPLITUDE: 12 mv
RV LEAD THRESHOLD: 0.875 V
TOT-0007: 0
TZAT-0004SLOWVT: 8
TZAT-0013SLOWVT: 3
TZAT-0018SLOWVT: NEGATIVE
TZON-0003SLOWVT: 300 ms
TZON-0004SLOWVT: 40
TZST-0001SLOWVT: 3
TZST-0001SLOWVT: 5
TZST-0003SLOWVT: 40 J
VENTRICULAR PACING ICD: 71 pct

## 2012-12-08 NOTE — Progress Notes (Signed)
ICD check with [employer] 

## 2013-01-04 ENCOUNTER — Ambulatory Visit (INDEPENDENT_AMBULATORY_CARE_PROVIDER_SITE_OTHER): Payer: Medicare Other | Admitting: Family

## 2013-01-04 DIAGNOSIS — I4891 Unspecified atrial fibrillation: Secondary | ICD-10-CM

## 2013-01-04 LAB — POCT INR: INR: 1.4

## 2013-01-04 NOTE — Patient Instructions (Addendum)
Take an extra 1/2 tab today and tomorrow then Continue same dose, 5mg  on Mondays,wednesdays and  fridays. Then 2.5mg  on other days, check in 3 weeks.  Anticoagulation Dose Instructions as of 01/04/2013     [name] [name] Tue Wed Thu Fri Sat   New Dose 2.5 mg 5 mg 2.5 mg 5 mg 2.5 mg 5 mg 2.5 mg    Description       Take an extra 1/2 tab today and tomorrow then Continue same dose, 5mg  on Mondays,wednesdays and  fridays. Then 2.5mg  on other days, check in 3 weeks.

## 2013-01-19 ENCOUNTER — Other Ambulatory Visit: Payer: Self-pay | Admitting: Internal Medicine

## 2013-01-21 ENCOUNTER — Ambulatory Visit (INDEPENDENT_AMBULATORY_CARE_PROVIDER_SITE_OTHER): Payer: Medicare Other | Admitting: Cardiovascular Disease

## 2013-01-21 ENCOUNTER — Encounter: Payer: Self-pay | Admitting: Cardiovascular Disease

## 2013-01-21 VITALS — BP 162/84 | HR 73 | Ht 72.0 in | Wt 201.0 lb

## 2013-01-21 DIAGNOSIS — I4891 Unspecified atrial fibrillation: Secondary | ICD-10-CM

## 2013-01-21 DIAGNOSIS — I428 Other cardiomyopathies: Secondary | ICD-10-CM

## 2013-01-21 DIAGNOSIS — I2510 Atherosclerotic heart disease of native coronary artery without angina pectoris: Secondary | ICD-10-CM

## 2013-01-21 NOTE — Patient Instructions (Addendum)
Your physician wants you to follow-up in: 6 MONTHS with Dr Excell Seltzer.   You will receive a reminder letter in the mail two months in advance. If you don't receive a letter, please call our office to schedule the follow-up appointment.  Your physician recommends that you continue on your current medications as directed. Please refer to the Current Medication list given to you today.  Your physician has requested that you have an echocardiogram. Echocardiography is a painless test that uses sound waves to create images of your heart. It provides your doctor with information about the size and shape of your heart and how well your heart's chambers and valves are working. This procedure takes approximately one hour. There are no restrictions for this procedure.

## 2013-01-21 NOTE — Progress Notes (Signed)
HPI:  79 year-old gentleman with CAD, remote LAD stenting with a BMS platform, and nonischemic cardiomyopathy LVEF 30%, presenting for follow-up evaluation. He is maintained on chronic anticoagulation with warfarin.  He's doing well. Not as active as in past because of his wife's illness (lung CA). Still works Librarian, academic for 5 hours, two days/week. He sees Dr Cato Mulligan and Dr Eliott Nine regularly. Denies chest pain, dyspnea, or lightheadedness. Has mild leg edema but this responds well to his daily furosemide. No bleeding problems on long-term anticoagulation.  Outpatient Encounter Prescriptions as of 01/21/2013  Medication Sig Dispense Refill  . aspirin 81 MG tablet Take 81 mg by mouth daily.        . calcitRIOL (ROCALTROL) 0.25 MCG capsule Take 0.25 mcg by mouth daily.       . digoxin (LANOXIN) 0.125 MG tablet TAKE 1 TABLET BY MOUTH DAILY  90 tablet  1  . furosemide (LASIX) 80 MG tablet TAKE 1 TABLET BY MOUTH DAILY  90 tablet  1  . glipiZIDE (GLUCOTROL) 5 MG tablet Take 2.5 mg by mouth 2 (two) times daily before a meal.       . glucose blood (FREESTYLE TEST STRIPS) test strip 1 each by Other route as needed. Use as instructed       . hydrALAZINE (APRESOLINE) 25 MG tablet Take 1 tablet (25 mg total) by mouth 3 (three) times daily.  270 tablet  3  . insulin lispro protamine-insulin lispro (HUMALOG 75/25) (75-25) 100 UNIT/ML SUSP Inject 45 Units into the skin 2 (two) times daily with a meal. Or as directed  30 mL  3  . isosorbide mononitrate (IMDUR) 30 MG 24 hr tablet Take 0.5 tablets (15 mg total) by mouth daily.  90 tablet  3  . metoprolol succinate (TOPROL-XL) 100 MG 24 hr tablet TAKE 1 TABLET BY MOUTH TWICE DAILY  180 tablet  1  . nitroGLYCERIN (NITROSTAT) 0.4 MG SL tablet Place 1 tablet (0.4 mg total) under the tongue every 5 (five) minutes as needed.  50 tablet  3  . potassium chloride SA (K-DUR,KLOR-CON) 20 MEQ tablet TAKE 1 TABLET BY MOUTH TWICE DAILY  180 tablet  0  . warfarin (COUMADIN)  5 MG tablet TAKE 1 TABLET BY MOUTH AS DIRECTED  90 tablet  0  . [DISCONTINUED] Nutritional Supplements (MELATONIN PO) Take 1 tablet by mouth daily.       No facility-administered encounter medications on file as of 01/21/2013.    Allergies  Allergen Reactions  . Prednisone Other (See Comments)    Raised blood sugar    Past Medical History  Diagnosis Date  . Atrial fibrillation   . CARDIOMYOPATHY, PRIMARY, DILATED   . COLON CANCER, HX OF   . CONGESTIVE HEART FAILURE   . CORONARY ARTERY DISEASE   . PROSTATE CANCER, HX OF   . RENAL DISEASE, CHRONIC, STAGE III   . SKIN CANCER, HX OF   . SLEEP APNEA   . Hypertension   . Hyperlipidemia   . Diabetes mellitus   . Hyperkalemia   . Arthritis   . Osteoarthritis   . Tubular adenoma of colon   . Secondary hyperparathyroidism     ROS: Negative except as per HPI  BP 162/84  Pulse 73  Ht 6' (1.829 m)  Wt 91.173 kg (201 lb)  BMI 27.25 kg/m2  SpO2 97%  PHYSICAL EXAM: Pt is alert and oriented, pleasant elderly male in NAD HEENT: normal Neck: JVP - normal, carotids 2+= without bruits  Lungs: CTA bilaterally CV: RRR without murmur or gallop Abd: soft, NT, Positive BS, no hepatomegaly Ext: trace pretibial edema bilaterally, distal pulses intact and equal Skin: warm/dry no rash  2D Echo 2011: Left ventricle: The cavity size was normal. Wall thickness was increased in a pattern of mild LVH. Systolic function was moderately to severely reduced. The estimated ejection fraction was in the range of 30% to 35%. Diffuse hypokinesis.  -------------------------------------------------------------------- Aortic valve: Trileaflet; mildly thickened leaflets. Mobility was not restricted. Doppler: Transvalvular velocity was within the normal range. There was no stenosis. Mild regurgitation. Valve area: 2.13cm^2(VTI). Indexed valve area: 1cm^2/m^2 (VTI). Valve area: 2.3cm^2 (Vmax). Indexed valve area: 1.08cm^2/m^2 (Vmax). Mean gradient: 4mm Hg  (S).  -------------------------------------------------------------------- Aorta: Aortic root: The aortic root was normal in size.  -------------------------------------------------------------------- Mitral valve: Structurally normal valve. Mobility was not restricted. Doppler: Transvalvular velocity was within the normal range. There was no evidence for stenosis. Mild regurgitation. Peak gradient: 3mm Hg (D).  -------------------------------------------------------------------- Left atrium: The atrium was moderately dilated.  -------------------------------------------------------------------- Right ventricle: The cavity size was normal. Pacer wire or catheter noted in right ventricle. Systolic function was normal.  -------------------------------------------------------------------- Pulmonic valve: Doppler: Transvalvular velocity was within the normal range. There was no evidence for stenosis. Trivial regurgitation.  -------------------------------------------------------------------- Tricuspid valve: Doppler: Transvalvular velocity was within the normal range. Mild regurgitation.  -------------------------------------------------------------------- Pulmonary artery: Systolic pressure was mildly increased.  -------------------------------------------------------------------- Right atrium: The atrium was moderately dilated. Pacer wire or catheter noted in right atrium.  -------------------------------------------------------------------- Pericardium: There was no pericardial effusion.  ASSESSMENT AND PLAN: 1. Mixed cardiomyopathy - s/p CRT-D, medical therapy. No symptoms at present but not very active. Encouraged to try to get back into some activity. Continue same med Rx. Update echo - last 2011.  2. HTN, suboptimal control. BP on my check 170/80. He feels this is related to his first meeting with me. Reviewed meds and if remains elevated would consider increase of imdur  and hydralazine, both of which are at low-dose. Will have to balance need for good control in setting of CKD/cardiomyopathy with his advance age. No med changes today.  3. CAD, native vessel. No anginal symptoms. Continue same Rx.  [name] [name] 01/21/2013 10:19 AM

## 2013-01-26 ENCOUNTER — Encounter: Payer: Self-pay | Admitting: Podiatry

## 2013-01-26 ENCOUNTER — Ambulatory Visit (INDEPENDENT_AMBULATORY_CARE_PROVIDER_SITE_OTHER): Payer: Medicare Other | Admitting: Podiatry

## 2013-01-26 VITALS — Ht 72.0 in | Wt 201.5 lb

## 2013-01-26 DIAGNOSIS — M25579 Pain in unspecified ankle and joints of unspecified foot: Secondary | ICD-10-CM | POA: Insufficient documentation

## 2013-01-26 DIAGNOSIS — B351 Tinea unguium: Secondary | ICD-10-CM

## 2013-01-26 NOTE — Progress Notes (Signed)
Subjective: 79 y.o. year old male patient presents complaining of painful nails. Patient requests toe nails trimmed.  Stated that blood sugar is still being monitored to reach optimum level. He does not recall last HgA1c.  Patient inquires about Diabetic shoe program. Stated that his toes feel numb on both feet.  Patient Summary List & History reviewed for allergies, medications, medical problems and surgical history.  Review of Systems - General ROS: negative for - chills, fatigue, fever, night sweats, weight gain or weight loss Ophthalmic ROS: negative ENT ROS: negative Allergy and Immunology ROS: negative Hematological and Lymphatic ROS: negative Endocrine ROS: negative Respiratory ROS: no cough, shortness of breath, or wheezing Cardiovascular ROS: no chest pain or dyspnea on exertion Gastrointestinal ROS: no abdominal pain, change in bowel habits, or black or bloody stools Genito-Urinary ROS: no dysuria, trouble voiding, or hematuria Musculoskeletal ROS: negative Neurological ROS: no TIA or stroke symptoms Dermatological ROS: negative  Objective: Dermatologic:  Thick dystrophic nails x 10. Cryptotic nails both great toes.  Skin: No skin lesions.  Vascular: Dorsalis pedis pulses palpable on both feet. Posterior tibial pulses are not palpable on both feet.  No edema or erythema, no ischemic changes noted.   Orthopedic:  Cavus type foot.   Neurologic:  Failed respond to Monofilament sensory testing. Vibratory and Ankle DTR is within normal bilateral.  Assessment: Dystrophic mycotic nails x 10. Pes Cavus. Diabetic neuropathy.  Treatment: All mycotic nails debrided.  Patient will return to PCP to get certification for Diabetic shoes. Supporting diagnosis for Diabetic shoe program: Pes cavus and Diabetic Neuropathy.

## 2013-01-31 ENCOUNTER — Encounter: Payer: Self-pay | Admitting: Internal Medicine

## 2013-01-31 ENCOUNTER — Other Ambulatory Visit: Payer: Self-pay | Admitting: Family

## 2013-01-31 ENCOUNTER — Ambulatory Visit (INDEPENDENT_AMBULATORY_CARE_PROVIDER_SITE_OTHER): Payer: Medicare Other | Admitting: Family

## 2013-01-31 ENCOUNTER — Ambulatory Visit (INDEPENDENT_AMBULATORY_CARE_PROVIDER_SITE_OTHER): Payer: Medicare Other | Admitting: Internal Medicine

## 2013-01-31 VITALS — BP 142/82 | HR 72 | Temp 98.1°F | Wt 201.0 lb

## 2013-01-31 DIAGNOSIS — I4891 Unspecified atrial fibrillation: Secondary | ICD-10-CM

## 2013-01-31 DIAGNOSIS — E1159 Type 2 diabetes mellitus with other circulatory complications: Secondary | ICD-10-CM

## 2013-01-31 DIAGNOSIS — I509 Heart failure, unspecified: Secondary | ICD-10-CM

## 2013-01-31 DIAGNOSIS — I10 Essential (primary) hypertension: Secondary | ICD-10-CM

## 2013-01-31 DIAGNOSIS — I2510 Atherosclerotic heart disease of native coronary artery without angina pectoris: Secondary | ICD-10-CM

## 2013-01-31 DIAGNOSIS — E1149 Type 2 diabetes mellitus with other diabetic neurological complication: Secondary | ICD-10-CM

## 2013-01-31 DIAGNOSIS — E119 Type 2 diabetes mellitus without complications: Secondary | ICD-10-CM

## 2013-01-31 LAB — LIPID PANEL
Cholesterol: 173 mg/dL (ref 0–200)
HDL: 30.5 mg/dL — ABNORMAL LOW (ref >39.00)
Triglycerides: 150 mg/dL — ABNORMAL HIGH (ref 0.0–149.0)

## 2013-01-31 LAB — HEMOGLOBIN A1C: Hgb A1c MFr Bld: 7.2 % — ABNORMAL HIGH (ref 4.6–6.5)

## 2013-01-31 LAB — HEPATIC FUNCTION PANEL
ALT: 16 U/L (ref 0–53)
AST: 20 U/L (ref 0–37)
Albumin: 3.8 g/dL (ref 3.5–5.2)
Alkaline Phosphatase: 31 U/L — ABNORMAL LOW (ref 39–117)
Total Protein: 6.9 g/dL (ref 6.0–8.3)

## 2013-01-31 LAB — BASIC METABOLIC PANEL
Calcium: 8.9 mg/dL (ref 8.4–10.5)
GFR: 20.79 mL/min — ABNORMAL LOW (ref >60.00)
Sodium: 138 mEq/L (ref 135–145)

## 2013-01-31 MED ORDER — METOPROLOL SUCCINATE ER 100 MG PO TB24: ORAL_TABLET | ORAL | Status: DC

## 2013-01-31 MED ORDER — DIGOXIN 125 MCG PO TABS: ORAL_TABLET | ORAL | Status: DC

## 2013-01-31 MED ORDER — ISOSORBIDE MONONITRATE ER 30 MG PO TB24: 15.0000 mg | ORAL_TABLET | Freq: Every day | ORAL | Status: DC

## 2013-01-31 MED ORDER — NITROGLYCERIN 0.4 MG SL SUBL: 0.4000 mg | SUBLINGUAL_TABLET | SUBLINGUAL | Status: DC | PRN

## 2013-01-31 MED ORDER — FUROSEMIDE 80 MG PO TABS: ORAL_TABLET | ORAL | Status: DC

## 2013-01-31 MED ORDER — WARFARIN SODIUM 5 MG PO TABS: ORAL_TABLET | ORAL | Status: DC

## 2013-01-31 MED ORDER — POTASSIUM CHLORIDE CRYS ER 20 MEQ PO TBCR: EXTENDED_RELEASE_TABLET | ORAL | Status: DC

## 2013-01-31 MED ORDER — GLIPIZIDE 5 MG PO TABS: 2.5000 mg | ORAL_TABLET | Freq: Two times a day (BID) | ORAL | Status: DC

## 2013-01-31 MED ORDER — INSULIN LISPRO PROT & LISPRO (75-25 MIX) 100 UNIT/ML ~~LOC~~ SUSP: 45.0000 [IU] | Freq: Two times a day (BID) | SUBCUTANEOUS | Status: DC

## 2013-01-31 NOTE — Progress Notes (Signed)
Patient ID: [name] [name]., male   DOB: 06/19/1931, 79 y.o.   MRN: [id number]   patient comes in for followup of multiple medical problems including type 2 diabetes, hyperlipidemia, hypertension. The patient does not check blood sugar or blood pressure at home. The patetient does not follow an exercise or diet program. The patient denies any polyuria, polydipsia.  In the past the patient has gone to diabetic treatment center. The patient is tolerating medications  Without difficulty. The patient does admit to medication compliance.   Past Medical History  Diagnosis Date  . Atrial fibrillation   . CARDIOMYOPATHY, PRIMARY, DILATED   . COLON CANCER, HX OF   . CONGESTIVE HEART FAILURE   . CORONARY ARTERY DISEASE   . PROSTATE CANCER, HX OF   . RENAL DISEASE, CHRONIC, STAGE III   . SKIN CANCER, HX OF   . SLEEP APNEA   . Hypertension   . Hyperlipidemia   . Diabetes mellitus   . Hyperkalemia   . Arthritis   . Osteoarthritis   . Tubular adenoma of colon   . Secondary hyperparathyroidism     History   Social History  . Marital Status: Married    Spouse Name: N/A    Number of Children: 4  . Years of Education: N/A   Occupational History  . retired    Social History Main Topics  . Smoking status: Former Smoker    Types: Cigarettes    Quit date: 09/22/1966  . Smokeless tobacco: Never Used  . Alcohol Use: 0 - .5 oz/week    0-1 drink(s) per week     Comment: occasional  . Drug Use: No  . Sexually Active: Not on file   Other Topics Concern  . Not on file   Social History Narrative  . No narrative on file    Past Surgical History  Procedure Laterality Date  . Colectomy    . Prostatectomy    . Penile prosthesis placement    . Removed prosthetic eye    . Ptca      stent placed  . Total knee arthroplasty  2008    right  . St jude unify      St. Jude single chamber defibrillator 07/19/09 (Dr. Graciela Husbands)  . Pacemaker insertion  2010  . Av fistula placement  03/30/2012   Procedure: ARTERIOVENOUS (AV) FISTULA CREATION;  Surgeon: Chuck Hint, MD;  Location: [facility] OR;  Service: Vascular;  Laterality: Right;  Creation of BrachioCephalic Fistula Right arm    Family History  Problem Relation Age of Onset  . Heart disease Father   . Throat cancer Father   . Throat cancer Mother   . Heart disease Mother   . Hypertension Mother     Allergies  Allergen Reactions  . Prednisone Other (See Comments)    Raised blood sugar    Current Outpatient Prescriptions on File Prior to Visit  Medication Sig Dispense Refill  . aspirin 81 MG tablet Take 81 mg by mouth daily.        . calcitRIOL (ROCALTROL) 0.25 MCG capsule Take 0.25 mcg by mouth daily.       . digoxin (LANOXIN) 0.125 MG tablet TAKE 1 TABLET BY MOUTH DAILY  90 tablet  1  . furosemide (LASIX) 80 MG tablet TAKE 1 TABLET BY MOUTH DAILY  90 tablet  1  . glipiZIDE (GLUCOTROL) 5 MG tablet Take 2.5 mg by mouth 2 (two) times daily before a meal.       .  glucose blood (FREESTYLE TEST STRIPS) test strip 1 each by Other route as needed. Use as instructed       . hydrALAZINE (APRESOLINE) 25 MG tablet Take 1 tablet (25 mg total) by mouth 3 (three) times daily.  270 tablet  3  . insulin lispro protamine-insulin lispro (HUMALOG 75/25) (75-25) 100 UNIT/ML SUSP Inject 45 Units into the skin 2 (two) times daily with a meal. Or as directed  30 mL  3  . isosorbide mononitrate (IMDUR) 30 MG 24 hr tablet Take 0.5 tablets (15 mg total) by mouth daily.  90 tablet  3  . metoprolol succinate (TOPROL-XL) 100 MG 24 hr tablet TAKE 1 TABLET BY MOUTH TWICE DAILY  180 tablet  1  . nitroGLYCERIN (NITROSTAT) 0.4 MG SL tablet Place 1 tablet (0.4 mg total) under the tongue every 5 (five) minutes as needed.  50 tablet  3  . potassium chloride SA (K-DUR,KLOR-CON) 20 MEQ tablet TAKE 1 TABLET BY MOUTH TWICE DAILY  180 tablet  0  . warfarin (COUMADIN) 5 MG tablet TAKE 1 TABLET BY MOUTH AS DIRECTED  90 tablet  0   No current  facility-administered medications on file prior to visit.     patient denies chest pain, shortness of breath, orthopnea. Denies lower extremity edema, abdominal pain, change in appetite, change in bowel movements. Patient denies rashes, musculoskeletal complaints. No other specific complaints in a complete review of systems.   There were no vitals taken for this visit.   well-developed well-nourished male in no acute distress. HEENT exam atraumatic, normocephalic, neck supple without jugular venous distention. Chest clear to auscultation cardiac exam S1-S2 are irregular. Abdominal exam overweight with bowel sounds, soft and nontender. Extremities no edema. Neurologic exam is alert with a normal gait.

## 2013-01-31 NOTE — Patient Instructions (Addendum)
Take an extra 1/2 tab today only.  5mg  a day except 1/2 on Mondays,wednesdays and  fridays. Recheck in 3 weeks.   Anticoagulation Dose Instructions as of 01/31/2013     Glynis Smiles Tue Wed Thu Fri Sat   New Dose 5 mg 2.5 mg 5 mg 2.5 mg 5 mg 2.5 mg 5 mg    Description       Take an extra 1/2 tab today only.  5mg  a day except 1/2 on Mondays,wednesdays and  fridays. Recheck in 3 weeks.

## 2013-02-01 ENCOUNTER — Encounter: Payer: Self-pay | Admitting: Internal Medicine

## 2013-02-01 NOTE — Assessment & Plan Note (Signed)
Symptoms are remarkably well controlled. Continue current medications. He has regular followup with cardiology.

## 2013-02-01 NOTE — Assessment & Plan Note (Signed)
Blood pressure at home is well controlled. He will continue to monitor.

## 2013-02-01 NOTE — Assessment & Plan Note (Signed)
I will check laboratory work today. He is noncompliant with checking blood sugars at home. We may need to adjust medications after review of laboratory work.

## 2013-02-01 NOTE — Assessment & Plan Note (Signed)
Patient has no symptoms. Will continue risk factor modification. 

## 2013-02-02 ENCOUNTER — Ambulatory Visit (HOSPITAL_COMMUNITY): Payer: Medicare Other | Attending: Cardiovascular Disease | Admitting: Radiology

## 2013-02-02 DIAGNOSIS — I379 Nonrheumatic pulmonary valve disorder, unspecified: Secondary | ICD-10-CM | POA: Insufficient documentation

## 2013-02-02 DIAGNOSIS — I079 Rheumatic tricuspid valve disease, unspecified: Secondary | ICD-10-CM | POA: Insufficient documentation

## 2013-02-02 DIAGNOSIS — I428 Other cardiomyopathies: Secondary | ICD-10-CM | POA: Insufficient documentation

## 2013-02-02 DIAGNOSIS — Z8249 Family history of ischemic heart disease and other diseases of the circulatory system: Secondary | ICD-10-CM | POA: Insufficient documentation

## 2013-02-02 DIAGNOSIS — I10 Essential (primary) hypertension: Secondary | ICD-10-CM | POA: Insufficient documentation

## 2013-02-02 DIAGNOSIS — I2510 Atherosclerotic heart disease of native coronary artery without angina pectoris: Secondary | ICD-10-CM | POA: Insufficient documentation

## 2013-02-02 DIAGNOSIS — E785 Hyperlipidemia, unspecified: Secondary | ICD-10-CM | POA: Insufficient documentation

## 2013-02-02 DIAGNOSIS — Z87891 Personal history of nicotine dependence: Secondary | ICD-10-CM | POA: Insufficient documentation

## 2013-02-02 DIAGNOSIS — E119 Type 2 diabetes mellitus without complications: Secondary | ICD-10-CM | POA: Insufficient documentation

## 2013-02-02 DIAGNOSIS — I080 Rheumatic disorders of both mitral and aortic valves: Secondary | ICD-10-CM | POA: Insufficient documentation

## 2013-02-02 DIAGNOSIS — I4891 Unspecified atrial fibrillation: Secondary | ICD-10-CM | POA: Insufficient documentation

## 2013-02-02 NOTE — Progress Notes (Signed)
Echocardiogram performed.  

## 2013-02-03 ENCOUNTER — Other Ambulatory Visit: Payer: Self-pay | Admitting: *Deleted

## 2013-02-03 MED ORDER — INSULIN LISPRO PROT & LISPRO (75-25 MIX) 100 UNIT/ML ~~LOC~~ SUSP: 45.0000 [IU] | Freq: Two times a day (BID) | SUBCUTANEOUS | Status: DC

## 2013-02-04 ENCOUNTER — Ambulatory Visit: Payer: Medicare Other | Admitting: Podiatry

## 2013-02-24 ENCOUNTER — Ambulatory Visit (INDEPENDENT_AMBULATORY_CARE_PROVIDER_SITE_OTHER): Payer: Medicare Other | Admitting: Family

## 2013-02-24 DIAGNOSIS — I4891 Unspecified atrial fibrillation: Secondary | ICD-10-CM

## 2013-02-24 NOTE — Patient Instructions (Addendum)
Take an extra 1/2 tab today. Then 5mg  a day except 1/2 on Mondays and  fridays. Recheck in 4 weeks.   Anticoagulation Dose Instructions as of 02/24/2013     [name] [name] Tue Wed Thu Fri Sat   New Dose 5 mg 2.5 mg 5 mg 5 mg 5 mg 2.5 mg 5 mg    Description       Take an extra 1/2 tab today. Then 5mg  a day except 1/2 on Mondays and  fridays. Recheck in 4 weeks.

## 2013-03-09 NOTE — Progress Notes (Signed)
    BiV ICD check in clinic. Mr. Barham states he only sees Dr. Graciela Husbands once a year and no more frequent than that unless there is a problem. He states he does not need to see me today. He has no complaints and states he feels fine. He thought his appointment was in device clinic.   His device interrogation shows normal BiV ICD function. No programming changes made. See PaceArt report.   Signed, Rick Duff, PA-C 03/09/2013, 11:04 PM

## 2013-03-10 ENCOUNTER — Ambulatory Visit (INDEPENDENT_AMBULATORY_CARE_PROVIDER_SITE_OTHER): Payer: Medicare Other | Admitting: Cardiology

## 2013-03-10 ENCOUNTER — Encounter: Payer: Self-pay | Admitting: Cardiology

## 2013-03-10 VITALS — BP 148/80 | HR 75 | Ht 72.0 in | Wt 201.8 lb

## 2013-03-10 DIAGNOSIS — Z95810 Presence of automatic (implantable) cardiac defibrillator: Secondary | ICD-10-CM

## 2013-03-10 DIAGNOSIS — I5022 Chronic systolic (congestive) heart failure: Secondary | ICD-10-CM

## 2013-03-10 DIAGNOSIS — I255 Ischemic cardiomyopathy: Secondary | ICD-10-CM

## 2013-03-10 DIAGNOSIS — I2589 Other forms of chronic ischemic heart disease: Secondary | ICD-10-CM

## 2013-03-10 DIAGNOSIS — I4891 Unspecified atrial fibrillation: Secondary | ICD-10-CM

## 2013-03-10 LAB — ICD DEVICE OBSERVATION
DEVICE MODEL ICD: 595859
HV IMPEDENCE: 46 Ohm
PACEART VT: 0
TZAT-0013SLOWVT: 3
TZAT-0019SLOWVT: 7.5 V
TZAT-0020SLOWVT: 1 ms
TZON-0005SLOWVT: 6
TZST-0001SLOWVT: 2
TZST-0001SLOWVT: 4
TZST-0003SLOWVT: 40 J
VENTRICULAR PACING ICD: 63 pct
VF: 0

## 2013-03-10 NOTE — Patient Instructions (Addendum)
Your physician recommends that you schedule a follow-up appointment in:  KEEP APPOINTMENT 06-16-2013  Your physician recommends that you continue on your current medications as directed. Please refer to the Current Medication list given to you today.

## 2013-03-24 ENCOUNTER — Ambulatory Visit (INDEPENDENT_AMBULATORY_CARE_PROVIDER_SITE_OTHER): Payer: Medicare Other | Admitting: Family

## 2013-03-24 DIAGNOSIS — I4891 Unspecified atrial fibrillation: Secondary | ICD-10-CM

## 2013-03-24 DIAGNOSIS — Z7901 Long term (current) use of anticoagulants: Secondary | ICD-10-CM

## 2013-03-24 LAB — POCT INR: INR: 2

## 2013-03-24 NOTE — Patient Instructions (Signed)
5mg  a day except 1/2 on Mondays and  fridays. Recheck in 4 weeks.   Anticoagulation Dose Instructions as of 03/24/2013     [name] [name] Tue Wed Thu Fri Sat   New Dose 5 mg 5 mg 2.5 mg 5 mg 2.5 mg 5 mg 5 mg    Description       5mg  a day except 1/2 on Mondays and  fridays. Recheck in 4 weeks.

## 2013-03-28 ENCOUNTER — Encounter: Payer: Self-pay | Admitting: Cardiovascular Disease

## 2013-03-28 NOTE — Telephone Encounter (Signed)
New Prob     Pt would like to speak to nurse regarding getting his recall letter too early in the mail.

## 2013-03-28 NOTE — Telephone Encounter (Signed)
This encounter was created in error - please disregard.

## 2013-04-21 ENCOUNTER — Ambulatory Visit (INDEPENDENT_AMBULATORY_CARE_PROVIDER_SITE_OTHER): Payer: Medicare Other | Admitting: General Practice

## 2013-04-21 ENCOUNTER — Other Ambulatory Visit: Payer: Self-pay | Admitting: Internal Medicine

## 2013-04-21 DIAGNOSIS — I4891 Unspecified atrial fibrillation: Secondary | ICD-10-CM

## 2013-04-21 DIAGNOSIS — I428 Other cardiomyopathies: Secondary | ICD-10-CM

## 2013-04-21 DIAGNOSIS — I2510 Atherosclerotic heart disease of native coronary artery without angina pectoris: Secondary | ICD-10-CM

## 2013-04-22 ENCOUNTER — Other Ambulatory Visit: Payer: Self-pay | Admitting: Internal Medicine

## 2013-04-22 LAB — MICROALBUMIN, URINE: Microalb/Crt. Ratio: 1

## 2013-04-22 LAB — HM DIABETES FOOT EXAM

## 2013-04-27 ENCOUNTER — Ambulatory Visit (INDEPENDENT_AMBULATORY_CARE_PROVIDER_SITE_OTHER): Payer: Medicare Other | Admitting: Podiatry

## 2013-04-27 VITALS — BP 158/73 | HR 71

## 2013-04-27 DIAGNOSIS — E1140 Type 2 diabetes mellitus with diabetic neuropathy, unspecified: Secondary | ICD-10-CM

## 2013-04-27 DIAGNOSIS — E1149 Type 2 diabetes mellitus with other diabetic neurological complication: Secondary | ICD-10-CM

## 2013-04-27 DIAGNOSIS — M25579 Pain in unspecified ankle and joints of unspecified foot: Secondary | ICD-10-CM

## 2013-04-27 DIAGNOSIS — E1142 Type 2 diabetes mellitus with diabetic polyneuropathy: Secondary | ICD-10-CM

## 2013-04-27 DIAGNOSIS — B351 Tinea unguium: Secondary | ICD-10-CM

## 2013-04-27 NOTE — Progress Notes (Signed)
Subjective:  79 y.o. year old male patient presents requesting toe nails trimmed. He still works at [employer]. Recently he lost a nail on 4th digit left without any injury. No pain or problem with the toe. He has gotten diabetic shoes in June and they are doing fine.   Objective: no significant changes since last visit  Dermatologic:  Thick dystrophic nails x 9 with missing 4th left.  Cryptotic nails both great toes.  Skin: No skin lesions.  Vascular:  Dorsalis pedis pulses palpable on both feet.  Posterior tibial pulses are not palpable on both feet.  No edema or erythema, no ischemic changes noted.  Orthopedic:  Cavus type foot.  Neurologic:  Failed respond to Monofilament sensory testing.  Vibratory and Ankle DTR is within normal bilateral.   Assessment:  Dystrophic mycotic nails x 10.  Pes Cavus.  Diabetic neuropathy.   Treatment: All mycotic nails debrided.  Return in 3 months or as needed.

## 2013-04-30 ENCOUNTER — Emergency Department (HOSPITAL_BASED_OUTPATIENT_CLINIC_OR_DEPARTMENT_OTHER): Payer: Medicare Other

## 2013-04-30 ENCOUNTER — Observation Stay (HOSPITAL_BASED_OUTPATIENT_CLINIC_OR_DEPARTMENT_OTHER)
Admission: EM | Admit: 2013-04-30 | Discharge: 2013-05-02 | Disposition: A | Discharge status: Home / Self Care | Payer: Medicare Other | Attending: Internal Medicine | Admitting: Internal Medicine

## 2013-04-30 ENCOUNTER — Observation Stay (HOSPITAL_COMMUNITY): Payer: Medicare Other

## 2013-04-30 ENCOUNTER — Encounter (HOSPITAL_BASED_OUTPATIENT_CLINIC_OR_DEPARTMENT_OTHER): Payer: Self-pay | Admitting: *Deleted

## 2013-04-30 DIAGNOSIS — R42 Dizziness and giddiness: Principal | ICD-10-CM

## 2013-04-30 DIAGNOSIS — R1110 Vomiting, unspecified: Secondary | ICD-10-CM

## 2013-04-30 DIAGNOSIS — Z79899 Other long term (current) drug therapy: Secondary | ICD-10-CM | POA: Insufficient documentation

## 2013-04-30 DIAGNOSIS — I428 Other cardiomyopathies: Secondary | ICD-10-CM | POA: Insufficient documentation

## 2013-04-30 DIAGNOSIS — I4891 Unspecified atrial fibrillation: Secondary | ICD-10-CM

## 2013-04-30 DIAGNOSIS — I420 Dilated cardiomyopathy: Secondary | ICD-10-CM | POA: Diagnosis present

## 2013-04-30 DIAGNOSIS — I129 Hypertensive chronic kidney disease with stage 1 through stage 4 chronic kidney disease, or unspecified chronic kidney disease: Secondary | ICD-10-CM | POA: Insufficient documentation

## 2013-04-30 DIAGNOSIS — N184 Chronic kidney disease, stage 4 (severe): Secondary | ICD-10-CM

## 2013-04-30 DIAGNOSIS — E119 Type 2 diabetes mellitus without complications: Secondary | ICD-10-CM

## 2013-04-30 DIAGNOSIS — Z95810 Presence of automatic (implantable) cardiac defibrillator: Secondary | ICD-10-CM

## 2013-04-30 DIAGNOSIS — I509 Heart failure, unspecified: Secondary | ICD-10-CM

## 2013-04-30 DIAGNOSIS — G4730 Sleep apnea, unspecified: Secondary | ICD-10-CM | POA: Insufficient documentation

## 2013-04-30 DIAGNOSIS — I482 Chronic atrial fibrillation, unspecified: Secondary | ICD-10-CM | POA: Diagnosis present

## 2013-04-30 DIAGNOSIS — Z7901 Long term (current) use of anticoagulants: Secondary | ICD-10-CM | POA: Insufficient documentation

## 2013-04-30 DIAGNOSIS — E1165 Type 2 diabetes mellitus with hyperglycemia: Secondary | ICD-10-CM | POA: Diagnosis present

## 2013-04-30 DIAGNOSIS — I10 Essential (primary) hypertension: Secondary | ICD-10-CM

## 2013-04-30 LAB — CBC WITH DIFFERENTIAL/PLATELET
Basophils Absolute: 0 10*3/uL (ref 0.0–0.1)
Basophils Relative: 1 % (ref 0–1)
Eosinophils Absolute: 0.2 10*3/uL (ref 0.0–0.7)
Hemoglobin: 14.8 g/dL (ref 13.0–17.0)
MCH: 31.7 pg (ref 26.0–34.0)
MCHC: 33.7 g/dL (ref 30.0–36.0)
Monocytes Absolute: 0.6 10*3/uL (ref 0.1–1.0)
Monocytes Relative: 9 % (ref 3–12)
Neutro Abs: 4.5 10*3/uL (ref 1.7–7.7)
Neutrophils Relative %: 68 % (ref 43–77)
RDW: 15 % (ref 11.5–15.5)

## 2013-04-30 LAB — URINALYSIS, ROUTINE W REFLEX MICROSCOPIC
Bilirubin Urine: NEGATIVE
Glucose, UA: 100 mg/dL — AB
Specific Gravity, Urine: 1.023 (ref 1.005–1.030)
pH: 5.5 (ref 5.0–8.0)

## 2013-04-30 LAB — TROPONIN I: Troponin I: <0.3 ng/mL (ref <0.30)

## 2013-04-30 LAB — COMPREHENSIVE METABOLIC PANEL
AST: 32 U/L (ref 0–37)
Albumin: 3.7 g/dL (ref 3.5–5.2)
BUN: 48 mg/dL — ABNORMAL HIGH (ref 6–23)
Creatinine, Ser: 2.8 mg/dL — ABNORMAL HIGH (ref 0.50–1.35)
Potassium: 4.4 mEq/L (ref 3.5–5.1)
Total Protein: 6.8 g/dL (ref 6.0–8.3)

## 2013-04-30 LAB — RAPID URINE DRUG SCREEN, HOSP PERFORMED
Amphetamines: NOT DETECTED
Barbiturates: NOT DETECTED
Cocaine: NOT DETECTED
Opiates: NOT DETECTED
Tetrahydrocannabinol: NOT DETECTED

## 2013-04-30 LAB — GLUCOSE, CAPILLARY
Glucose-Capillary: 177 mg/dL — ABNORMAL HIGH (ref 70–99)
Glucose-Capillary: 143 mg/dL — ABNORMAL HIGH (ref 70–99)

## 2013-04-30 LAB — URINE MICROSCOPIC-ADD ON

## 2013-04-30 MED ORDER — FUROSEMIDE 80 MG PO TABS
80.0000 mg | ORAL_TABLET | Freq: Every day | ORAL | Status: DC
  Administered 2013-04-30 – 2013-05-02 (×3): 80 mg via ORAL
  Filled 2013-04-30 (×3): qty 1

## 2013-04-30 MED ORDER — ONDANSETRON HCL 4 MG/2ML IJ SOLN
4.0000 mg | Freq: Once | INTRAMUSCULAR | Status: AC
  Administered 2013-04-30: 4 mg via INTRAVENOUS
  Filled 2013-04-30: qty 2

## 2013-04-30 MED ORDER — SODIUM CHLORIDE 0.9 % IJ SOLN
3.0000 mL | Freq: Two times a day (BID) | INTRAMUSCULAR | Status: DC
  Administered 2013-04-30 – 2013-05-02 (×4): 3 mL via INTRAVENOUS

## 2013-04-30 MED ORDER — INSULIN ASPART 100 UNIT/ML ~~LOC~~ SOLN
0.0000 [IU] | Freq: Three times a day (TID) | SUBCUTANEOUS | Status: DC
  Administered 2013-05-01 (×2): 2 [IU] via SUBCUTANEOUS
  Administered 2013-05-02: 1 [IU] via SUBCUTANEOUS

## 2013-04-30 MED ORDER — CALCITRIOL 0.25 MCG PO CAPS
0.2500 ug | ORAL_CAPSULE | Freq: Every day | ORAL | Status: DC
  Administered 2013-04-30 – 2013-05-02 (×3): 0.25 ug via ORAL
  Filled 2013-04-30 (×3): qty 1

## 2013-04-30 MED ORDER — MECLIZINE HCL 25 MG PO TABS
25.0000 mg | ORAL_TABLET | Freq: Three times a day (TID) | ORAL | Status: DC | PRN
  Filled 2013-04-30: qty 1

## 2013-04-30 MED ORDER — ACETAMINOPHEN 650 MG RE SUPP: 650.0000 mg | RECTAL | Status: DC | PRN

## 2013-04-30 MED ORDER — NITROGLYCERIN 0.4 MG SL SUBL: 0.4000 mg | SUBLINGUAL_TABLET | SUBLINGUAL | Status: DC | PRN

## 2013-04-30 MED ORDER — WARFARIN - PHARMACIST DOSING INPATIENT: Freq: Every day | Status: DC

## 2013-04-30 MED ORDER — GLIPIZIDE 2.5 MG HALF TABLET
2.5000 mg | ORAL_TABLET | Freq: Two times a day (BID) | ORAL | Status: DC
  Administered 2013-04-30 – 2013-05-02 (×4): 2.5 mg via ORAL
  Filled 2013-04-30 (×6): qty 1

## 2013-04-30 MED ORDER — ASPIRIN 81 MG PO CHEW
81.0000 mg | CHEWABLE_TABLET | Freq: Every day | ORAL | Status: DC
  Administered 2013-04-30 – 2013-05-02 (×3): 81 mg via ORAL
  Filled 2013-04-30 (×3): qty 1

## 2013-04-30 MED ORDER — SODIUM CHLORIDE 0.9 % IV BOLUS (SEPSIS)
500.0000 mL | Freq: Once | INTRAVENOUS | Status: AC
  Administered 2013-04-30: 500 mL via INTRAVENOUS

## 2013-04-30 MED ORDER — DIGOXIN 125 MCG PO TABS
0.1250 mg | ORAL_TABLET | Freq: Every day | ORAL | Status: DC
  Administered 2013-04-30 – 2013-05-02 (×3): 0.125 mg via ORAL
  Filled 2013-04-30 (×3): qty 1

## 2013-04-30 MED ORDER — POTASSIUM CHLORIDE CRYS ER 20 MEQ PO TBCR
20.0000 meq | EXTENDED_RELEASE_TABLET | Freq: Two times a day (BID) | ORAL | Status: DC
  Administered 2013-04-30 – 2013-05-02 (×4): 20 meq via ORAL
  Filled 2013-04-30 (×5): qty 1

## 2013-04-30 MED ORDER — SENNOSIDES-DOCUSATE SODIUM 8.6-50 MG PO TABS
1.0000 | ORAL_TABLET | Freq: Every evening | ORAL | Status: DC | PRN
  Administered 2013-04-30: 1 via ORAL
  Filled 2013-04-30: qty 1

## 2013-04-30 MED ORDER — INSULIN ASPART 100 UNIT/ML ~~LOC~~ SOLN
0.0000 [IU] | Freq: Every day | SUBCUTANEOUS | Status: DC
Start: 2013-04-30 — End: 2013-05-02

## 2013-04-30 MED ORDER — MECLIZINE HCL 25 MG PO TABS
12.5000 mg | ORAL_TABLET | Freq: Once | ORAL | Status: AC
  Administered 2013-04-30: 12.5 mg via ORAL
  Filled 2013-04-30: qty 1

## 2013-04-30 MED ORDER — SODIUM CHLORIDE 0.9 % IJ SOLN: 3.0000 mL | INTRAMUSCULAR | Status: DC | PRN

## 2013-04-30 MED ORDER — HYDRALAZINE HCL 25 MG PO TABS
25.0000 mg | ORAL_TABLET | Freq: Three times a day (TID) | ORAL | Status: DC
  Administered 2013-04-30 – 2013-05-02 (×5): 25 mg via ORAL
  Filled 2013-04-30 (×7): qty 1

## 2013-04-30 MED ORDER — METOPROLOL SUCCINATE ER 100 MG PO TB24
100.0000 mg | ORAL_TABLET | Freq: Two times a day (BID) | ORAL | Status: DC
  Administered 2013-04-30 – 2013-05-02 (×4): 100 mg via ORAL
  Filled 2013-04-30 (×5): qty 1

## 2013-04-30 MED ORDER — ACETAMINOPHEN 325 MG PO TABS: 650.0000 mg | ORAL_TABLET | ORAL | Status: DC | PRN

## 2013-04-30 MED ORDER — ISOSORBIDE MONONITRATE 15 MG HALF TABLET
15.0000 mg | ORAL_TABLET | Freq: Every day | ORAL | Status: DC
  Administered 2013-04-30 – 2013-05-02 (×3): 15 mg via ORAL
  Filled 2013-04-30 (×3): qty 1

## 2013-04-30 MED ORDER — INSULIN ASPART PROT & ASPART (70-30 MIX) 100 UNIT/ML ~~LOC~~ SUSP
35.0000 [IU] | Freq: Two times a day (BID) | SUBCUTANEOUS | Status: DC
  Administered 2013-05-01 – 2013-05-02 (×3): 35 [IU] via SUBCUTANEOUS
  Filled 2013-04-30: qty 10

## 2013-04-30 MED ORDER — WARFARIN SODIUM 7.5 MG PO TABS
7.5000 mg | ORAL_TABLET | Freq: Once | ORAL | Status: DC
  Filled 2013-04-30: qty 1

## 2013-04-30 MED ORDER — SODIUM CHLORIDE 0.9 % IV SOLN
250.0000 mL | INTRAVENOUS | Status: DC | PRN
Start: 2013-04-30 — End: 2013-05-02

## 2013-04-30 NOTE — Discharge Summary (Addendum)
[facility] History and Physical  [name] [name]. ZOX:[id number] DOB: October 21, 1930 DOA: 04/30/2013  Referring physician:  PCP: Judie Petit, MD  Specialists:   Chief Complaint: Vertigo  HPI: [name] [name]. is a 79 y.o. male with history of atrial fibrillation status post a pacemaker in the past, CKD STAGE III, DM and multiple medical problems as listed below who presents with above complaints. he states that when he woke up this morning with it felt like the room was spinning and he was unable to keep his balance. He drank some juice thinking that it might be related to hypoglycemia but that did not help. He admits to associated nausea and vomiting. He also states that turning his head from side to side seemed to precipitate the vertigo. He was seen at the [facility] ED and a CT scan of his head was negative. He denies headaches, blurry vision, focal weakness, dysphagia, chest pain, cough, dysuria and no fevers. He is admitted for further evaluation and management.  Review of Systems: The patient denies anorexia, fever, weight loss,, vision loss, decreased hearing, hoarseness, chest pain, syncope, dyspnea on exertion, peripheral edema, balance deficits, hemoptysis, abdominal pain, melena, hematochezia, severe indigestion/heartburn, hematuria, incontinence, genital sores, muscle weakness, suspicious skin lesions, transient blindness, depression, unusual weight change, abnormal bleeding.  Past Medical History  Diagnosis Date  . Atrial fibrillation   . CARDIOMYOPATHY, PRIMARY, DILATED   . COLON CANCER, HX OF   . CONGESTIVE HEART FAILURE   . CORONARY ARTERY DISEASE   . PROSTATE CANCER, HX OF   . RENAL DISEASE, CHRONIC, STAGE III   . SKIN CANCER, HX OF   . SLEEP APNEA   . Hypertension   . Hyperlipidemia   . Diabetes mellitus   . Hyperkalemia   . Arthritis   . Osteoarthritis   . Tubular adenoma of colon   . Secondary hyperparathyroidism   . Automatic implantable  cardioverter-defibrillator in situ   . Shortness of breath   . Pacemaker    Past Surgical History  Procedure Laterality Date  . Colectomy    . Prostatectomy    . Penile prosthesis placement    . Removed prosthetic eye    . Ptca      stent placed  . Total knee arthroplasty  2008    right  . St jude unify      St. Jude single chamber defibrillator 07/19/09 (Dr. Graciela Husbands)  . Pacemaker insertion  2010  . Av fistula placement  03/30/2012    Procedure: ARTERIOVENOUS (AV) FISTULA CREATION;  Surgeon: Chuck Hint, MD;  Location: [facility] OR;  Service: Vascular;  Laterality: Right;  Creation of BrachioCephalic Fistula Right arm  . Cardiac catheterization    . Coronary angioplasty    . Eye surgery      left eye prothesis  . Fracture surgery      collar bone, right knee replacement  . Insert / replace / remove pacemaker     Social History:  reports that he quit smoking about 46 years ago. His smoking use included Cigarettes. He smoked 0.00 packs per day. He has never used smokeless tobacco. He reports that  drinks alcohol. He reports that he does not use illicit drugs.  where does patient live--home  Can patient participate in ADL-yes  Allergies  Allergen Reactions  . Prednisone Other (See Comments)    Raised blood sugar    Family History  Problem Relation Age of Onset  . Heart disease Father   .  Throat cancer Father   . Throat cancer Mother   . Heart disease Mother   . Hypertension Mother    Prior to Admission medications   Medication Sig Start Date End Date Taking? Authorizing Provider  aspirin 81 MG chewable tablet Chew 81 mg by mouth daily.   Yes Historical Provider, MD  calcitRIOL (ROCALTROL) 0.25 MCG capsule Take 0.25 mcg by mouth daily.    Yes Historical Provider, MD  digoxin (LANOXIN) 0.125 MG tablet Take 0.125 mg by mouth daily.   Yes Historical Provider, MD  furosemide (LASIX) 80 MG tablet Take 80 mg by mouth daily.   Yes Historical Provider, MD  glipiZIDE (GLUCOTROL)  5 MG tablet Take 0.5 tablets (2.5 mg total) by mouth 2 (two) times daily before a meal. 01/31/13  Yes Bruce Rexene Edison Swords, MD  glucose blood (FREESTYLE TEST STRIPS) test strip 1 each by Other route as needed. Use as instructed    Yes Historical Provider, MD  hydrALAZINE (APRESOLINE) 25 MG tablet Take 25 mg by mouth 3 (three) times daily. 10/06/12  Yes Bruce Romilda Garret, MD  insulin lispro protamine-lispro (HUMALOG 75/25) (75-25) 100 UNIT/ML SUSP injection Inject 45 Units into the skin 2 (two) times daily with a meal.   Yes Historical Provider, MD  isosorbide mononitrate (IMDUR) 30 MG 24 hr tablet Take 0.5 tablets (15 mg total) by mouth daily. 01/31/13  Yes Lindley Magnus, MD  metoprolol succinate (TOPROL-XL) 100 MG 24 hr tablet Take 100 mg by mouth 2 (two) times daily. Take with or immediately following a meal.   Yes Historical Provider, MD  potassium chloride SA (K-DUR,KLOR-CON) 20 MEQ tablet Take 20 mEq by mouth 2 (two) times daily.   Yes Historical Provider, MD  warfarin (COUMADIN) 5 MG tablet Take 2.5-5 mg by mouth daily. Sunday, Monday, Wednesday, Friday, Saturday takes 5mg ; Tuesday, Thursday takes 2.5mg    Yes Historical Provider, MD  nitroGLYCERIN (NITROSTAT) 0.4 MG SL tablet Place 0.4 mg under the tongue every 5 (five) minutes x 3 doses as needed for chest pain.    Historical Provider, MD   Physical Exam: Filed Vitals:   04/30/13 1430  BP: 157/84  Pulse: 71  Temp:   Resp: 18    Constitutional: Vital signs reviewed.  Patient is a well-developed and well-nourished  in no acute distress and cooperative with exam. Alert and oriented x3. No facial asymmetry Head: Normocephalic and atraumatic Nose: No erythema or drainage noted.  Turbinates normal Mouth: no erythema or exudates, MMM Eyes: PERRL, EOMI, conjunctivae normal, No scleral icterus.  Neck: Supple, Trachea midline normal ROM, No JVD, mass, thyromegaly, or carotid bruit present.  Cardiovascular: Irreg, irreg, S1 normal, S2 normal, no MRG,  pulses symmetric and intact bilaterally Pulmonary/Chest: normal respiratory effort, CTAB, no wheezes, rales, or rhonchi Abdominal: Soft. Non-tender, non-distended, bowel sounds are normal, no masses, organomegaly, or guarding present.  GU: no CVA tenderness Extremities: No cyanosis, no edema Neurological: A&O x3, Strength is normal and symmetric bilaterally, cranial nerve II-XII are grossly intact, no focal motor deficit, sensory intact to light touch bilaterally.  Skin: Warm, dry and intact. No rash.  Psychiatric: Normal mood and affect. speech and behavior is normal. Judgment and thought content normal. Cognition and memory are normal.     Labs on Admission:  Basic Metabolic Panel:  Recent Labs Lab 04/30/13 0930  NA 141  K 4.4  CL 102  CO2 25  GLUCOSE 193*  BUN 48*  CREATININE 2.80*  CALCIUM 9.9   Liver Function Tests:  Recent Labs Lab 04/30/13 0930  AST 32  ALT 18  ALKPHOS 36*  BILITOT 0.8  PROT 6.8  ALBUMIN 3.7   No results found for this basename: LIPASE, AMYLASE,  in the last 168 hours No results found for this basename: AMMONIA,  in the last 168 hours CBC:  Recent Labs Lab 04/30/13 0930  WBC 6.6  NEUTROABS 4.5  HGB 14.8  HCT 43.9  MCV 94.0  PLT 100*   Cardiac Enzymes:  Recent Labs Lab 04/30/13 0930  TROPONINI <0.30    BNP (last 3 results) No results found for this basename: PROBNP,  in the last 8760 hours CBG:  Recent Labs Lab 04/30/13 0922 04/30/13 1507  GLUCAP 177* 178*    Radiological Exams on Admission: Ct Head Wo Contrast  04/30/2013   *RADIOLOGY REPORT*  Clinical Data: Dizziness, vomiting, nausea.  History of colon and prostate carcinoma.  CT HEAD WITHOUT CONTRAST  Technique:  Contiguous axial images were obtained from the base of the skull through the vertex without contrast.  Comparison: 12/27/2009  Findings: Atherosclerotic and physiologic intracranial calcifications.  Stable punctate calcification in the pons.  Early left basal  ganglia mineralization as before. Diffuse parenchymal atrophy. Patchy areas of hypoattenuation in deep and periventricular white matter bilaterally. Negative for acute intracranial hemorrhage, mass lesion, acute infarction, midline shift, or mass-effect. Acute infarct may be inapparent on noncontrast CT. Ventricles and sulci symmetric. Bone windows demonstrate no focal lesion.  IMPRESSION:  1. Negative for bleed or other acute intracranial process.  2. Atrophy and nonspecific white matter changes   Original Report Authenticated By: D. Andria Rhein, MD     Assessment/Plan Active Problems: Vertigo -central versus peripheral -As discussed above, in patient with multiple risk factors for CVA-concern about possible posterior circulation infarction -Due to his pacemaker unable to get MRI, we'll consult neuro for further recommendations -Will obtain carotid Doppler, echo, arrest of CVA w/u -Continue aspirin 81 mg on Coumadin-his INR is subtherapeutic, pharmacy to follow.   DIABETES MELLITUS, TYPE II -Continue 70/25 and glipizide -Monitor Accu-Cheks and cover with sliding scale insulin   HYPERTENSION -Continue outpatient medications with hold parameters for the hydralazine   CARDIOMYOPATHY, PRIMARY, DILATED/CHF -Compensated, continue outpatient medications   Atrial fibrillation  -Rate controlled, continue beta blockers and anticoagulation with Coumadin as above   SLEEP APNEA -Continue CPAP each bedtime   Chronic renal insufficiency, stage IV (severe) -Stable, follow     Code Status: full Family Communication: wife at bedside Disposition Plan: admit to tele Time spent: >42mins  Kela Millin [facility] Pager [phone]  If 7PM-7AM, please contact night-coverage [email or web address] Password [facility] 04/30/2013, 5:57 PM

## 2013-04-30 NOTE — Consult Note (Signed)
NEURO HOSPITALIST CONSULT NOTE    Reason for Consult: acute onset vertigo.  HPI:                                                                                                                                          [name] Ruta. is an 79 y.o. male with a past medical history significant for HTN, DM, hyperlipidemia, dilated cardiomyopathy, CAD, s/p pacemaker placement, atrial fibrillation on coumadin, CKD stage III, OA, colon cancer s/p colectomy, transferred to [facility] for further evaluation acute vertigo. He denies prior episodes of vertigo. [name] [name] stated that he awoke this  morning and when turned over his head to the left he experienced a "dizzy spinning sensation" that was subsequently follow by unsteadiness when he attempted to walk. The episode was short lived and no associated with headache, double vision, confusion, difficulty swallowing, focal weakness or numbness, slurred speech, language or vision impairment. No tinnitus, decreased hearing, increased perspiration or feeling hot/clammy. Patient's wife said he was " off balance and leaning probably to the left". He said that he felt nauseated when he arrived to the ED. He continues experiencing " a spinning sensation" when he turns his head to the left which abates as soon as the head is in the still position. No recent fever, infection, head or neck trauma. CT brain performed at [facility] ED showed no acute abnormality. Sub-therapeutic INR 1.78  Past Medical History  Diagnosis Date  . Atrial fibrillation   . CARDIOMYOPATHY, PRIMARY, DILATED   . COLON CANCER, HX OF   . CONGESTIVE HEART FAILURE   . CORONARY ARTERY DISEASE   . PROSTATE CANCER, HX OF   . RENAL DISEASE, CHRONIC, STAGE III   . SKIN CANCER, HX OF   . SLEEP APNEA   . Hypertension   . Hyperlipidemia   . Diabetes mellitus   . Hyperkalemia   . Arthritis   . Osteoarthritis   . Tubular adenoma of colon   . Secondary hyperparathyroidism   .  Automatic implantable cardioverter-defibrillator in situ   . Shortness of breath   . Pacemaker     Past Surgical History  Procedure Laterality Date  . Colectomy    . Prostatectomy    . Penile prosthesis placement    . Removed prosthetic eye    . Ptca      stent placed  . Total knee arthroplasty  2008    right  . St jude unify      St. Jude single chamber defibrillator 07/19/09 (Dr. Graciela Husbands)  . Pacemaker insertion  2010  . Av fistula placement  03/30/2012    Procedure: ARTERIOVENOUS (AV) FISTULA CREATION;  Surgeon: Chuck Hint, MD;  Location: [facility] OR;  Service: Vascular;  Laterality: Right;  Creation of  BrachioCephalic Fistula Right arm  . Cardiac catheterization    . Coronary angioplasty    . Eye surgery      left eye prothesis  . Fracture surgery      collar bone, right knee replacement  . Insert / replace / remove pacemaker      Family History  Problem Relation Age of Onset  . Heart disease Father   . Throat cancer Father   . Throat cancer Mother   . Heart disease Mother   . Hypertension Mother      Social History:  reports that he quit smoking about 46 years ago. His smoking use included Cigarettes. He smoked 0.00 packs per day. He has never used smokeless tobacco. He reports that  drinks alcohol. He reports that he does not use illicit drugs.  Allergies  Allergen Reactions  . Prednisone Other (See Comments)    Raised blood sugar    MEDICATIONS:                                                                                                                     I have reviewed the patient's current medications.   ROS:                                                                                                                                       History obtained from the patient and wife.  General ROS: negative for - chills, fatigue, fever, night sweats, weight gain or weight loss Psychological ROS: negative for - behavioral disorder, hallucinations,  memory difficulties, mood swings or suicidal ideation Ophthalmic ROS: negative for - blurry vision, double vision, eye pain or loss of vision ENT ROS: negative for - epistaxis, nasal discharge, oral lesions, sore throat, or tinnitus Allergy and Immunology ROS: negative for - hives or itchy/watery eyes Hematological and Lymphatic ROS: negative for - bleeding problems, bruising or swollen lymph nodes Endocrine ROS: negative for - galactorrhea, hair pattern changes, polydipsia/polyuria or temperature intolerance Respiratory ROS: negative for - cough, hemoptysis, shortness of breath or wheezing Cardiovascular ROS: negative for - chest pain, dyspnea on exertion, edema or irregular heartbeat Gastrointestinal ROS: negative for - abdominal pain, diarrhea, hematemesis, nausea/vomiting or stool incontinence Genito-Urinary ROS: negative for - dysuria, hematuria, incontinence or urinary frequency/urgency Musculoskeletal ROS: negative for - joint swelling or muscular weakness Neurological ROS: as noted in HPI Dermatological ROS: negative for rash and skin  lesion changes      Physical exam: pleasant male in no apparent distress.Blood pressure 157/84, pulse 71, temperature 98.1 F (36.7 C), temperature source Oral, resp. rate 18, weight 90.719 kg (200 lb), SpO2 91.00%.  Head: normocephalic. Neck: supple, no bruits, no JVD. Cardiac: no murmurs. Lungs: clear. Abdomen: soft, no tender, no mass. Extremities: no edema.    Neurologic Examination:                                                                                                      Mental Status: Alert, awake, oriented x 4, thought content appropriate.  Speech fluent without evidence of aphasia.  Able to follow 3 step commands without difficulty. Cranial Nerves: II: Discs flat in the right, s/p prosthesis left eye ; Visual fields grossly normal in the right, s/p prosthesis in the left. Right  Pupil 4 mm, reactive to light, s/p left  prosthesis. III,IV, VI: ptosis not present, extra-ocular motions intact in the right, s/p prosthesis in the left. V,VII: smile symmetric, facial light touch sensation normal bilaterally VIII: hearing normal bilaterally IX,X: gag reflex present XI: bilateral shoulder shrug XII: midline tongue extension Motor: Right : Upper extremity   5/5    Left:     Upper extremity   5/5  Lower extremity   5/5     Lower extremity   5/5 Tone and bulk:normal tone throughout; no atrophy noted Sensory: Pinprick and light touch intact throughout, bilaterally Deep Tendon Reflexes:  1+ all over  Plantars: Right: downgoing   Left: downgoing Cerebellar: normal finger-to-nose,  normal heel-to-shin test Gait:  No tested due to positional vertigo. CV: pulses palpable throughout    Lab Results  Component Value Date/Time   CHOL 173 01/31/2013 10:30 AM    Results for orders placed during the hospital encounter of 04/30/13 (from the past 48 hour(s))  GLUCOSE, CAPILLARY     Status: Abnormal   Collection Time    04/30/13  9:22 AM      Result Value Range   Glucose-Capillary 177 (*) 70 - 99 mg/dL   Comment 1 Notify RN     Comment 2 Documented in Chart    CBC WITH DIFFERENTIAL     Status: Abnormal   Collection Time    04/30/13  9:30 AM      Result Value Range   WBC 6.6  4.0 - 10.5 K/uL   RBC 4.67  4.22 - 5.81 MIL/uL   Hemoglobin 14.8  13.0 - 17.0 g/dL   HCT 45.4  09.8 - 11.9 %   MCV 94.0  78.0 - 100.0 fL   MCH 31.7  26.0 - 34.0 pg   MCHC 33.7  30.0 - 36.0 g/dL   RDW 14.7  82.9 - 56.2 %   Platelets 100 (*) 150 - 400 K/uL   Comment: REPEATED TO VERIFY     PLATELET COUNT CONFIRMED BY SMEAR   Neutrophils Relative % 68  43 - 77 %   Neutro Abs 4.5  1.7 - 7.7 K/uL   Lymphocytes Relative 21  12 - 46 %  Lymphs Abs 1.4  0.7 - 4.0 K/uL   Monocytes Relative 9  3 - 12 %   Monocytes Absolute 0.6  0.1 - 1.0 K/uL   Eosinophils Relative 2  0 - 5 %   Eosinophils Absolute 0.2  0.0 - 0.7 K/uL   Basophils Relative  1  0 - 1 %   Basophils Absolute 0.0  0.0 - 0.1 K/uL  COMPREHENSIVE METABOLIC PANEL     Status: Abnormal   Collection Time    04/30/13  9:30 AM      Result Value Range   Sodium 141  135 - 145 mEq/L   Potassium 4.4  3.5 - 5.1 mEq/L   Comment: SLIGHT HEMOLYSIS     HEMOLYSIS AT THIS LEVEL MAY AFFECT RESULT   Chloride 102  96 - 112 mEq/L   CO2 25  19 - 32 mEq/L   Glucose, Bld 193 (*) 70 - 99 mg/dL   BUN 48 (*) 6 - 23 mg/dL   Creatinine, Ser 9.14 (*) 0.50 - 1.35 mg/dL   Calcium 9.9  8.4 - 78.2 mg/dL   Total Protein 6.8  6.0 - 8.3 g/dL   Albumin 3.7  3.5 - 5.2 g/dL   AST 32  0 - 37 U/L   Comment: SLIGHT HEMOLYSIS     HEMOLYSIS AT THIS LEVEL MAY AFFECT RESULT   ALT 18  0 - 53 U/L   Alkaline Phosphatase 36 (*) 39 - 117 U/L   Total Bilirubin 0.8  0.3 - 1.2 mg/dL   GFR calc non Af Amer 20 (*) >90 mL/min   GFR calc Af Amer 23 (*) >90 mL/min   Comment:            The eGFR has been calculated     using the CKD EPI equation.     This calculation has not been     validated in all clinical     situations.     eGFR's persistently     <90 mL/min signify     possible Chronic Kidney Disease.  PROTIME-INR     Status: Abnormal   Collection Time    04/30/13  9:30 AM      Result Value Range   Prothrombin Time 20.2 (*) 11.6 - 15.2 seconds   INR 1.78 (*) 0.00 - 1.49  APTT     Status: Abnormal   Collection Time    04/30/13  9:30 AM      Result Value Range   aPTT 44 (*) 24 - 37 seconds   Comment:            IF BASELINE aPTT IS ELEVATED,     SUGGEST PATIENT RISK ASSESSMENT     BE USED TO DETERMINE APPROPRIATE     ANTICOAGULANT THERAPY.  TROPONIN I     Status: None   Collection Time    04/30/13  9:30 AM      Result Value Range   Troponin I <0.30  <0.30 ng/mL   Comment:            Due to the release kinetics of cTnI,     a negative result within the first hours     of the onset of symptoms does not rule out     myocardial infarction with certainty.     If myocardial infarction is still  suspected,     repeat the test at appropriate intervals.  URINE RAPID DRUG SCREEN (HOSP PERFORMED)     Status: None  Collection Time    04/30/13 10:18 AM      Result Value Range   Opiates NONE DETECTED  NONE DETECTED   Cocaine NONE DETECTED  NONE DETECTED   Benzodiazepines NONE DETECTED  NONE DETECTED   Amphetamines NONE DETECTED  NONE DETECTED   Tetrahydrocannabinol NONE DETECTED  NONE DETECTED   Barbiturates NONE DETECTED  NONE DETECTED   Comment:            DRUG SCREEN FOR MEDICAL PURPOSES     ONLY.  IF CONFIRMATION IS NEEDED     FOR ANY PURPOSE, NOTIFY LAB     WITHIN 5 DAYS.                LOWEST DETECTABLE LIMITS     FOR URINE DRUG SCREEN     Drug Class       Cutoff (ng/mL)     Amphetamine      1000     Barbiturate      200     Benzodiazepine   200     Tricyclics       300     Opiates          300     Cocaine          300     THC              50  URINALYSIS, ROUTINE W REFLEX MICROSCOPIC     Status: Abnormal   Collection Time    04/30/13 10:18 AM      Result Value Range   Color, Urine YELLOW  YELLOW   APPearance CLEAR  CLEAR   Specific Gravity, Urine 1.023  1.005 - 1.030   pH 5.5  5.0 - 8.0   Glucose, UA 100 (*) NEGATIVE mg/dL   Hgb urine dipstick TRACE (*) NEGATIVE   Bilirubin Urine NEGATIVE  NEGATIVE   Ketones, ur NEGATIVE  NEGATIVE mg/dL   Protein, ur >474 (*) NEGATIVE mg/dL   Urobilinogen, UA 1.0  0.0 - 1.0 mg/dL   Nitrite NEGATIVE  NEGATIVE   Leukocytes, UA NEGATIVE  NEGATIVE  URINE MICROSCOPIC-ADD ON     Status: Abnormal   Collection Time    04/30/13 10:18 AM      Result Value Range   WBC, UA 0-2  <3 WBC/hpf   RBC / HPF 0-2  <3 RBC/hpf   Bacteria, UA FEW (*) RARE   Casts HYALINE CASTS (*) NEGATIVE   Urine-Other MUCOUS PRESENT    GLUCOSE, CAPILLARY     Status: Abnormal   Collection Time    04/30/13  3:07 PM      Result Value Range   Glucose-Capillary 178 (*) 70 - 99 mg/dL   Comment 1 Notify RN     Comment 2 Documented in Chart    GLUCOSE,  CAPILLARY     Status: Abnormal   Collection Time    04/30/13  5:58 PM      Result Value Range   Glucose-Capillary 143 (*) 70 - 99 mg/dL    Ct Head Wo Contrast  04/30/2013   *RADIOLOGY REPORT*  Clinical Data: Dizziness, vomiting, nausea.  History of colon and prostate carcinoma.  CT HEAD WITHOUT CONTRAST  Technique:  Contiguous axial images were obtained from the base of the skull through the vertex without contrast.  Comparison: 12/27/2009  Findings: Atherosclerotic and physiologic intracranial calcifications.  Stable punctate calcification in the pons.  Early left basal ganglia mineralization as before. Diffuse parenchymal atrophy. Patchy  areas of hypoattenuation in deep and periventricular white matter bilaterally. Negative for acute intracranial hemorrhage, mass lesion, acute infarction, midline shift, or mass-effect. Acute infarct may be inapparent on noncontrast CT. Ventricles and sulci symmetric. Bone windows demonstrate no focal lesion.  IMPRESSION:  1. Negative for bleed or other acute intracranial process.  2. Atrophy and nonspecific white matter changes   Original Report Authenticated By: D. Andria Rhein, MD     Assessment/Plan: 79 y/o male with multiple risk factors for stroke and new onset vertigo with a neuro-exam that is non focal and CT brain that is unimpressive. Can not have MRI due to pacemaker. Creatinine 2.80 and no CTA or repeat CT brain recommended. Transcranial doppler if persistent vertigo could be helpful, but at this time will not change management. Although he has multiple risk factors for stroke and I can not exclude the possibility of vertebrobasilar ischemia, the pattern of his vertigo has a pattern that appears to be reminiscent of BPPV. Sub-therapeutic INR: coumadin dosing as per pharmacy.  PT/vestibular therapy with Eply's exercises. Will follow up  Wyatt Portela, MD [facility] [phone]  04/30/2013, 6:15 PM

## 2013-04-30 NOTE — ED Notes (Addendum)
patient states he has been dizzy sine he got up this morning, drank some orange juice but it did no help, any movement makes it worse

## 2013-04-30 NOTE — ED Provider Notes (Signed)
CSN: [id number]     Arrival date & time 04/30/13  0906 History     First MD Initiated Contact with Patient 04/30/13 [phone]     Chief Complaint  Patient presents with  . Dizziness   (Consider location/radiation/quality/duration/timing/severity/associated sxs/prior Treatment) Patient is a 79 y.o. male presenting with neurologic complaint. The history is provided by the patient.  Neurologic Problem This is a new problem. The current episode started 1 to 2 hours ago. The problem occurs constantly. The problem has not changed since onset.Pertinent negatives include no chest pain, no abdominal pain, no headaches and no shortness of breath. Exacerbated by: head movement. The symptoms are relieved by rest. He has tried nothing for the symptoms. The treatment provided no relief.    Past Medical History  Diagnosis Date  . Atrial fibrillation   . CARDIOMYOPATHY, PRIMARY, DILATED   . COLON CANCER, HX OF   . CONGESTIVE HEART FAILURE   . CORONARY ARTERY DISEASE   . PROSTATE CANCER, HX OF   . RENAL DISEASE, CHRONIC, STAGE III   . SKIN CANCER, HX OF   . SLEEP APNEA   . Hypertension   . Hyperlipidemia   . Diabetes mellitus   . Hyperkalemia   . Arthritis   . Osteoarthritis   . Tubular adenoma of colon   . Secondary hyperparathyroidism    Past Surgical History  Procedure Laterality Date  . Colectomy    . Prostatectomy    . Penile prosthesis placement    . Removed prosthetic eye    . Ptca      stent placed  . Total knee arthroplasty  2008    right  . St jude unify      St. Jude single chamber defibrillator 07/19/09 (Dr. Graciela Husbands)  . Pacemaker insertion  2010  . Av fistula placement  03/30/2012    Procedure: ARTERIOVENOUS (AV) FISTULA CREATION;  Surgeon: Chuck Hint, MD;  Location: [facility] OR;  Service: Vascular;  Laterality: Right;  Creation of BrachioCephalic Fistula Right arm   Family History  Problem Relation Age of Onset  . Heart disease Father   . Throat cancer Father   . Throat  cancer Mother   . Heart disease Mother   . Hypertension Mother    History  Substance Use Topics  . Smoking status: Former Smoker    Types: Cigarettes    Quit date: 09/22/1966  . Smokeless tobacco: Never Used  . Alcohol Use: 0 - .5 oz/week    0-1 drink(s) per week     Comment: occasional    Review of Systems  Constitutional: Negative for fever.  HENT: Negative for rhinorrhea, drooling and neck pain.   Eyes: Negative for pain.  Respiratory: Negative for cough and shortness of breath.   Cardiovascular: Negative for chest pain and leg swelling.  Gastrointestinal: Positive for nausea and vomiting. Negative for abdominal pain and diarrhea.  Genitourinary: Negative for dysuria and hematuria.  Musculoskeletal: Negative for gait problem.  Skin: Negative for color change.  Neurological: Negative for weakness, numbness and headaches.  Hematological: Negative for adenopathy.  Psychiatric/Behavioral: Negative for behavioral problems.  All other systems reviewed and are negative.    Allergies  Prednisone  Home Medications   Current Outpatient Rx  Name  Route  Sig  Dispense  Refill  . aspirin 81 MG tablet   Oral   Take 81 mg by mouth daily.           . calcitRIOL (ROCALTROL) 0.25 MCG capsule  Oral   Take 0.25 mcg by mouth daily.          . digoxin (LANOXIN) 0.125 MG tablet      TAKE 1 TABLET BY MOUTH DAILY   90 tablet   3   . furosemide (LASIX) 80 MG tablet      TAKE 1 TABLET BY MOUTH DAILY   90 tablet   3   . glipiZIDE (GLUCOTROL) 5 MG tablet   Oral   Take 0.5 tablets (2.5 mg total) by mouth 2 (two) times daily before a meal.   180 tablet   3   . glucose blood (FREESTYLE TEST STRIPS) test strip   Other   1 each by Other route as needed. Use as instructed          . hydrALAZINE (APRESOLINE) 25 MG tablet   Oral   Take 1 tablet (25 mg total) by mouth 3 (three) times daily.   270 tablet   3   . insulin lispro protamine-lispro (HUMALOG 75/25) (75-25) 100  UNIT/ML SUSP   Subcutaneous   Inject 45 Units into the skin 2 (two) times daily with a meal. Or as directed   90 mL   3   . isosorbide mononitrate (IMDUR) 30 MG 24 hr tablet   Oral   Take 0.5 tablets (15 mg total) by mouth daily.   90 tablet   3   . metoprolol succinate (TOPROL-XL) 100 MG 24 hr tablet      TAKE 1 TABLET BY MOUTH TWICE DAILY   180 tablet   1   . nitroGLYCERIN (NITROSTAT) 0.4 MG SL tablet   Sublingual   Place 1 tablet (0.4 mg total) under the tongue every 5 (five) minutes as needed.   50 tablet   3   . potassium chloride SA (K-DUR,KLOR-CON) 20 MEQ tablet      TAKE 1 TABLET BY MOUTH TWICE DAILY   180 tablet   3   . warfarin (COUMADIN) 5 MG tablet      TAKE 1 TABLET BY MOUTH AS DIRECTED   90 tablet   1    BP 165/103  Pulse 75  Temp(Src) 98.1 F (36.7 C) (Oral)  Resp 20  SpO2 99% Physical Exam  Nursing note and vitals reviewed. Constitutional: He is oriented to person, place, and time. He appears well-developed and well-nourished.  HENT:  Head: Normocephalic and atraumatic.  Right Ear: External ear normal.  Left Ear: External ear normal.  Nose: Nose normal.  Mouth/Throat: Oropharynx is clear and moist. No oropharyngeal exudate.  Eyes: Conjunctivae and EOM are normal. Pupils are equal, round, and reactive to light.  Neck: Normal range of motion. Neck supple.  Cardiovascular: Normal rate, regular rhythm, normal heart sounds and intact distal pulses.  Exam reveals no gallop and no friction rub.   No murmur heard. Pulmonary/Chest: Effort normal and breath sounds normal. No respiratory distress. He has no wheezes.  Abdominal: Soft. Bowel sounds are normal. He exhibits no distension. There is no tenderness. There is no rebound and no guarding.  Musculoskeletal: Normal range of motion. He exhibits no edema and no tenderness.  Neurological: He is alert and oriented to person, place, and time.  Pos romberg. Very mild ataxia w/ ambulation. Pt can ambulate  forwards and backwards. Normal finger to nose and heel to shin test bilaterally. Normal comprehension, normal speech, normal peripheral field vision. The patient is alert and oriented x3.  Skin: Skin is warm and dry.  Psychiatric: He has  a normal mood and affect. His behavior is normal.    ED Course   Procedures (including critical care time)  Labs Reviewed  GLUCOSE, CAPILLARY - Abnormal; Notable for the following:    Glucose-Capillary 177 (*)    All other components within normal limits  CBC WITH DIFFERENTIAL - Abnormal; Notable for the following:    Platelets 100 (*)    All other components within normal limits  COMPREHENSIVE METABOLIC PANEL - Abnormal; Notable for the following:    Glucose, Bld 193 (*)    BUN 48 (*)    Creatinine, Ser 2.80 (*)    Alkaline Phosphatase 36 (*)    GFR calc non Af Amer 20 (*)    GFR calc Af Amer 23 (*)    All other components within normal limits  PROTIME-INR - Abnormal; Notable for the following:    Prothrombin Time 20.2 (*)    INR 1.78 (*)    All other components within normal limits  APTT - Abnormal; Notable for the following:    aPTT 44 (*)    All other components within normal limits  URINALYSIS, ROUTINE W REFLEX MICROSCOPIC - Abnormal; Notable for the following:    Glucose, UA 100 (*)    Hgb urine dipstick TRACE (*)    Protein, ur >300 (*)    All other components within normal limits  URINE MICROSCOPIC-ADD ON - Abnormal; Notable for the following:    Bacteria, UA FEW (*)    Casts HYALINE CASTS (*)    All other components within normal limits  GLUCOSE, CAPILLARY - Abnormal; Notable for the following:    Glucose-Capillary 178 (*)    All other components within normal limits  GLUCOSE, CAPILLARY - Abnormal; Notable for the following:    Glucose-Capillary 143 (*)    All other components within normal limits  URINE RAPID DRUG SCREEN (HOSP PERFORMED)  TROPONIN I  PROTIME-INR  HEMOGLOBIN A1C  LIPID PANEL  TROPONIN I  TROPONIN I   TROPONIN I  BASIC METABOLIC PANEL   Ct Head Wo Contrast  04/30/2013   *RADIOLOGY REPORT*  Clinical Data: Dizziness, vomiting, nausea.  History of colon and prostate carcinoma.  CT HEAD WITHOUT CONTRAST  Technique:  Contiguous axial images were obtained from the base of the skull through the vertex without contrast.  Comparison: 12/27/2009  Findings: Atherosclerotic and physiologic intracranial calcifications.  Stable punctate calcification in the pons.  Early left basal ganglia mineralization as before. Diffuse parenchymal atrophy. Patchy areas of hypoattenuation in deep and periventricular white matter bilaterally. Negative for acute intracranial hemorrhage, mass lesion, acute infarction, midline shift, or mass-effect. Acute infarct may be inapparent on noncontrast CT. Ventricles and sulci symmetric. Bone windows demonstrate no focal lesion.  IMPRESSION:  1. Negative for bleed or other acute intracranial process.  2. Atrophy and nonspecific white matter changes   Original Report Authenticated By: D. Andria Rhein, MD   1. Dizziness   2. Vomiting   3. Atrial fibrillation   4. Single ICD (implantable cardiac defibrillator) in place   5. Type II or unspecified type diabetes mellitus without mention of complication, not stated as uncontrolled   6. Unspecified essential hypertension   7. Vertigo      Date: 04/30/2013  Rate: 74  Rhythm: atrial fibrillation  QRS Axis: left  Intervals: indeterminate  ST/T Wave abnormalities: nonspecific ST/T changes  Conduction Disutrbances:none  Narrative Interpretation: Pacer spikes, flipped t waves in V4-V6 seen on old ecg and not new  Old EKG Reviewed: unchanged  MDM  10:00 AM 79 y.o. male here w/ sudden onset dizziness upon awakening at 8am. Last normal, possibly 1-2am when he got up in the night. Pt AFVSS here. Mild ataxia on exam, mild emesis. Lateral nystagmus on exam. Will workup for posterior CVA.  Will admit to hospitalist   Junius Argyle,  MD 04/30/13 [phone]

## 2013-04-30 NOTE — Consult Note (Signed)
ANTICOAGULATION CONSULT NOTE - Initial Consult  Pharmacy Consult for Coumadin Indication: atrial fibrillation  Allergies  Allergen Reactions  . Prednisone Other (See Comments)    Raised blood sugar    Patient Measurements: Weight: 200 lb (90.719 kg)  Vital Signs: Temp: 98.1 F (36.7 C) (08/09 1145) Temp src: Oral (08/09 0916) BP: 157/84 mmHg (08/09 1430) Pulse Rate: 71 (08/09 1430)  Labs:  Recent Labs  04/30/13 0930  HGB 14.8  HCT 43.9  PLT 100*  APTT 44*  LABPROT 20.2*  INR 1.78*  CREATININE 2.80*  TROPONINI <0.30    The CrCl is unknown because both a height and weight (above a minimum accepted value) are required for this calculation.   Medical History: Past Medical History  Diagnosis Date  . Atrial fibrillation   . CARDIOMYOPATHY, PRIMARY, DILATED   . COLON CANCER, HX OF   . CONGESTIVE HEART FAILURE   . CORONARY ARTERY DISEASE   . PROSTATE CANCER, HX OF   . RENAL DISEASE, CHRONIC, STAGE III   . SKIN CANCER, HX OF   . SLEEP APNEA   . Hypertension   . Hyperlipidemia   . Diabetes mellitus   . Hyperkalemia   . Arthritis   . Osteoarthritis   . Tubular adenoma of colon   . Secondary hyperparathyroidism   . Automatic implantable cardioverter-defibrillator in situ   . Shortness of breath   . Pacemaker    Assessment: 82yom on coumadin pta for afib s/p PPM presents with vertigo. He will continue his home coumadin. INR on admission is below goal at 1.78. Home dose is 5mg  daily except 2.5mg  on Tuesday/Thursday with last dose taken yesterday. CBC is stable.  Goal of Therapy:  INR 2-3 Monitor platelets by anticoagulation protocol: Yes   Plan:  1) Coumadin 7.5mg  x 1 tonight 2) Daily INR  Fredrik Rigger 04/30/2013,6:16 PM

## 2013-05-01 DIAGNOSIS — I059 Rheumatic mitral valve disease, unspecified: Secondary | ICD-10-CM

## 2013-05-01 DIAGNOSIS — I509 Heart failure, unspecified: Secondary | ICD-10-CM

## 2013-05-01 LAB — BASIC METABOLIC PANEL
BUN: 54 mg/dL — ABNORMAL HIGH (ref 6–23)
CO2: 25 mEq/L (ref 19–32)
Calcium: 9.1 mg/dL (ref 8.4–10.5)
GFR calc non Af Amer: 17 mL/min — ABNORMAL LOW (ref >90)
Glucose, Bld: 141 mg/dL — ABNORMAL HIGH (ref 70–99)
Potassium: 3.8 mEq/L (ref 3.5–5.1)
Sodium: 143 mEq/L (ref 135–145)

## 2013-05-01 LAB — LIPID PANEL
HDL: 28 mg/dL — ABNORMAL LOW (ref >39)
LDL Cholesterol: 81 mg/dL (ref 0–99)
Triglycerides: 201 mg/dL — ABNORMAL HIGH (ref <150)
VLDL: 40 mg/dL (ref 0–40)

## 2013-05-01 LAB — GLUCOSE, CAPILLARY
Glucose-Capillary: 180 mg/dL — ABNORMAL HIGH (ref 70–99)
Glucose-Capillary: 50 mg/dL — ABNORMAL LOW (ref 70–99)
Glucose-Capillary: 86 mg/dL (ref 70–99)

## 2013-05-01 LAB — PROTIME-INR: Prothrombin Time: 21.6 seconds — ABNORMAL HIGH (ref 11.6–15.2)

## 2013-05-01 LAB — TROPONIN I: Troponin I: <0.3 ng/mL (ref <0.30)

## 2013-05-01 LAB — HEMOGLOBIN A1C: Mean Plasma Glucose: 148 mg/dL — ABNORMAL HIGH (ref <117)

## 2013-05-01 MED ORDER — WARFARIN SODIUM 5 MG PO TABS
5.0000 mg | ORAL_TABLET | Freq: Once | ORAL | Status: AC
  Administered 2013-05-01: 5 mg via ORAL
  Filled 2013-05-01: qty 1

## 2013-05-01 MED ORDER — MECLIZINE HCL 25 MG PO TABS: 25.0000 mg | ORAL_TABLET | Freq: Three times a day (TID) | ORAL | Status: DC | PRN

## 2013-05-01 NOTE — Evaluation (Addendum)
Physical Therapy Evaluation Patient Details Name: Nickalos Petersen. MRN: [id number] DOB: 1931-05-10 Today's Date: 05/01/2013 Time: 8295-6213 PT Time Calculation (min): 39 min  PT Assessment / Plan / Recommendation History of Present Illness  Patient is an 79 yo male admitted with vertigo.  Clinical Impression  Patient presents with vertigo impacting functional mobility and safety.  Patient with normal occulomotor assessment, smooth pursuits, saccades, and VOR (with Rt eye only.  Lt eye is prosthesis).  Performed roll test for horizontal canals - tested negative.  Performed Modified Hallpike testing - negative to right, but positive to left for Lt posterior canal BPPV.  Performed Epley Maneuver - patient without dizziness following canalith repositioning.  Encouraged patient to remain upright for remainder of day.  Will benefit from acute PT to monitor and treat for BPPV prior to discharge.  Recommend OP PT for Vestibular Rehab f/u at discharge.  Recommended patient use his RW for safety at discharge for fall prevention until he can get to OP PT for f/u.    PT Assessment  Patient needs continued PT services    Follow Up Recommendations  Outpatient PT;Supervision/Assistance - 24 hour (for Vestibular Rehab)    Does the patient have the potential to tolerate intense rehabilitation      Barriers to Discharge        Equipment Recommendations  None recommended by PT    Recommendations for Other Services     Frequency Min 5X/week    Precautions / Restrictions Precautions Precautions: Fall Precaution Comments: Patient notes vertigo with movements of his head. Restrictions Weight Bearing Restrictions: No   Pertinent Vitals/Pain       Mobility  Bed Mobility Bed Mobility: Rolling Right;Rolling Left;Right Sidelying to Sit;Sit to Sidelying Right;Sit to Sidelying Left Rolling Right: 7: Independent Rolling Left: 7: Independent Right Sidelying to Sit: 4: Min guard Sit to Sidelying  Right: 4: Min guard Sit to Sidelying Left: 4: Min guard Details for Bed Mobility Assistance: Patient able to perform all bed mobility with min guard assist for safety only.  Noted slight dizziness with rolling to left.  Performed Modified Hallpike to right with no dizziness; and to left with dizziness and nystagmus on return to sitting. Transfers Transfers: Sit to Stand;Stand to Sit Sit to Stand: 4: Min guard;From bed;From toilet Stand to Sit: 4: Min guard;To toilet;To bed Details for Transfer Assistance: No cues needed.  Assist for balance/safety.   Ambulation/Gait Ambulation/Gait Assistance: 4: Min guard;4: Min Environmental consultant (Feet): 40 Feet Assistive device: None Ambulation/Gait Assistance Details: Patient ambulating in guarded/stiff manner without movement of head or UE's.  States he holds his head still to avoid dizziness.  Patient into bathroom and lost balance posteriorly when bending forward and reaching for rail.  Required min assist to prevent fall.  Reported he got dizzy and lost his balance. Gait Pattern: Step-through pattern;Decreased stride length;Shuffle;Decreased trunk rotation Gait velocity: Slow gait speed Modified Rankin (Stroke Patients Only) Pre-Morbid Rankin Score: No symptoms Modified Rankin: Slight disability    Exercises     PT Diagnosis: Difficulty walking;Abnormality of gait (Dizziness/Vertigo)  PT Problem List: Decreased activity tolerance;Decreased balance;Decreased mobility (Dizziness/Vertigo) PT Treatment Interventions: Gait training;Functional mobility training;Balance training;Patient/family education (Vestibular Rehab)     PT Goals(Current goals can be found in the care plan section) Acute Rehab PT Goals Patient Stated Goal: To stop being dizzy PT Goal Formulation: With patient/family Time For Goal Achievement: 05/08/13 Potential to Achieve Goals: Good  Visit Information  Last PT Received On: 05/01/13 Assistance  Needed: +1 History of  Present Illness: Patient is an 79 yo male admitted with vertigo.       Prior Functioning  Home Living Family/patient expects to be discharged to:: Private residence Living Arrangements: Spouse/significant other Available Help at Discharge: Family;Available 24 hours/day Type of Home: House Home Access: Stairs to enter [employer] of Steps: 1 Entrance Stairs-Rails: None Home Layout: Two level;Bed/bath upstairs Alternate Level Stairs-Number of Steps: 6 x2 (with landing between) Alternate Level Stairs-Rails: Right Home Equipment: Walker - 2 wheels;Shower seat;Cane - single point Prior Function Level of Independence: Independent Comments: Patient drives Communication Communication: No difficulties    Cognition  Cognition Arousal/Alertness: Awake/alert Behavior During Therapy: WFL for tasks assessed/performed Overall Cognitive Status: Within Functional Limits for tasks assessed    Extremity/Trunk Assessment Upper Extremity Assessment Upper Extremity Assessment: Overall WFL for tasks assessed Lower Extremity Assessment Lower Extremity Assessment: Overall WFL for tasks assessed   Balance Balance Balance Assessed: Yes Static Sitting Balance Static Sitting - Balance Support: No upper extremity supported;Feet supported Static Sitting - Level of Assistance: 5: Stand by assistance Static Sitting - Comment/# of Minutes: 4 Static Standing Balance Static Standing - Balance Support: No upper extremity supported Static Standing - Level of Assistance: 5: Stand by assistance Static Standing - Comment/# of Minutes: 2 minutes - good balance statically  End of Session PT - End of Session Equipment Utilized During Treatment: Gait belt Activity Tolerance: Patient tolerated treatment well Patient left: in bed;with call bell/phone within reach;with family/visitor present (HOB elevated) Nurse Communication: Mobility status  GP Functional Assessment Tool Used: clinical  judgement Functional Limitation: Mobility: Walking and moving around Mobility: Walking and Moving Around Current Status [phone]): At least 20 percent but less than 40 percent impaired, limited or restricted Mobility: Walking and Moving Around Goal Status [phone]): 0 percent impaired, limited or restricted   Vena Austria 05/01/2013, 5:24 PM Durenda Hurt. Renaldo Fiddler, [facility] Acute Rehab Services Pager [phone]

## 2013-05-01 NOTE — Progress Notes (Signed)
  Echocardiogram 2D Echocardiogram has been performed.  [name] [name] [name] 05/01/2013, 12:54 PM

## 2013-05-01 NOTE — Progress Notes (Signed)
[facility] PROGRESS NOTE  [name] [name]. ZOX:[id number] DOB: 02-Apr-1931 DOA: 04/30/2013 PCP: Judie Petit, MD  Assessment/Plan: Vertigo  - Symptoms are suggestive of a peripheral vertigo - Neurology consulted - Vestibular PT consulted - awaiting recs - On meclizine for sx - Pt reports some improvement, but is still symptomatic - Pt info re: vertigo given to patient DIABETES MELLITUS, TYPE II  -Continue 70/25 and glipizide  -Monitor Accu-Cheks and cover with sliding scale insulin  HYPERTENSION  -Continue outpatient medications with hold parameters for the hydralazine  CARDIOMYOPATHY, PRIMARY, DILATED/CHF  -Compensated, continue outpatient medications  Atrial fibrillation  -Rate controlled, continue beta blockers and anticoagulation with Coumadin as above  SLEEP APNEA  -Continue CPAP each bedtime  Chronic renal insufficiency, stage IV (severe)  -Stable, follow  Code Status: Full Family Communication: Pt and wife in room (indicate person spoken with, relationship, and if by phone, the number) Disposition Plan: Pending   Consultants:  Neurology  HPI/Subjective: Reports feeling somewhat better this AM. Still with residual vertigo sx.  Objective: Filed Vitals:   05/01/13 0200 05/01/13 0400 05/01/13 0830 05/01/13 1308  BP: 145/54 138/62 143/56 131/61  Pulse: 71 72 67 76  Temp: 98.4 F (36.9 C) 97.9 F (36.6 C) 97.7 F (36.5 C) 97.6 F (36.4 C)  TempSrc: Oral Oral Axillary Oral  Resp: 16 16 18 18   Weight:      SpO2: 92% 94% 89% 94%    Intake/Output Summary (Last 24 hours) at 05/01/13 1418 Last data filed at 05/01/13 0831  Gross per 24 hour  Intake    120 ml  Output    250 ml  Net   -130 ml   Filed Weights   04/30/13 1700  Weight: 90.719 kg (200 lb)    Exam:   General:  Awake, in nad  Cardiovascular: regular, s1, s2  Respiratory: normal resp effort, no wheezing  Abdomen: soft, nondistended  Musculoskeletal: perfused, no clubbing    Data Reviewed: Basic Metabolic Panel:  Recent Labs Lab 04/30/13 0930 05/01/13 0733  NA 141 143  K 4.4 3.8  CL 102 106  CO2 25 25  GLUCOSE 193* 141*  BUN 48* 54*  CREATININE 2.80* 3.17*  CALCIUM 9.9 9.1   Liver Function Tests:  Recent Labs Lab 04/30/13 0930  AST 32  ALT 18  ALKPHOS 36*  BILITOT 0.8  PROT 6.8  ALBUMIN 3.7   No results found for this basename: LIPASE, AMYLASE,  in the last 168 hours No results found for this basename: AMMONIA,  in the last 168 hours CBC:  Recent Labs Lab 04/30/13 0930  WBC 6.6  NEUTROABS 4.5  HGB 14.8  HCT 43.9  MCV 94.0  PLT 100*   Cardiac Enzymes:  Recent Labs Lab 04/30/13 0930 04/30/13 1924 04/30/13 2320 05/01/13 0733  TROPONINI <0.30 <0.30 <0.30 <0.30   BNP (last 3 results) No results found for this basename: PROBNP,  in the last 8760 hours CBG:  Recent Labs Lab 04/30/13 1507 04/30/13 1758 04/30/13 2236 05/01/13 0906 05/01/13 1130  GLUCAP 178* 143* 135* 180* 183*    No results found for this or any previous visit (from the past 240 hour(s)).   Studies: Dg Chest 2 View  04/30/2013   *RADIOLOGY REPORT*  Clinical Data: Dizziness  CHEST - 2 VIEW  Comparison: 03/26/2012  Findings: Heart is moderately enlarged.  Left subclavian AICD device and leads are stable and intact.  Small bilateral pleural effusions and bibasilar atelectasis.  Upper lungs clear.  Normal  vascularity.  No pneumothorax.  IMPRESSION: Small bilateral pleural effusions and bibasilar atelectasis.   Original Report Authenticated By: Jolaine Click, M.D.   Ct Head Wo Contrast  04/30/2013   *RADIOLOGY REPORT*  Clinical Data: Dizziness, vomiting, nausea.  History of colon and prostate carcinoma.  CT HEAD WITHOUT CONTRAST  Technique:  Contiguous axial images were obtained from the base of the skull through the vertex without contrast.  Comparison: 12/27/2009  Findings: Atherosclerotic and physiologic intracranial calcifications.  Stable punctate  calcification in the pons.  Early left basal ganglia mineralization as before. Diffuse parenchymal atrophy. Patchy areas of hypoattenuation in deep and periventricular white matter bilaterally. Negative for acute intracranial hemorrhage, mass lesion, acute infarction, midline shift, or mass-effect. Acute infarct may be inapparent on noncontrast CT. Ventricles and sulci symmetric. Bone windows demonstrate no focal lesion.  IMPRESSION:  1. Negative for bleed or other acute intracranial process.  2. Atrophy and nonspecific white matter changes   Original Report Authenticated By: D. Andria Rhein, MD    Scheduled Meds: . aspirin  81 mg Oral Daily  . calcitRIOL  0.25 mcg Oral Daily  . digoxin  0.125 mg Oral Daily  . furosemide  80 mg Oral Daily  . glipiZIDE  2.5 mg Oral BID AC  . hydrALAZINE  25 mg Oral TID  . insulin aspart  0-5 Units Subcutaneous QHS  . insulin aspart  0-9 Units Subcutaneous TID WC  . insulin aspart protamine- aspart  35 Units Subcutaneous BID WC  . isosorbide mononitrate  15 mg Oral Daily  . metoprolol succinate  100 mg Oral BID  . potassium chloride SA  20 mEq Oral BID  . sodium chloride  3 mL Intravenous Q12H  . warfarin  5 mg Oral ONCE-1800  . warfarin  7.5 mg Oral Once  . Warfarin - Pharmacist Dosing Inpatient   Does not apply q1800   Continuous Infusions:   Active Problems:   DIABETES MELLITUS, TYPE II   HYPERTENSION   CARDIOMYOPATHY, PRIMARY, DILATED   Atrial fibrillation   SLEEP APNEA   Chronic renal insufficiency, stage IV (severe)   Vertigo    Time spent:    CHIU, STEPHEN K  [facility] Pager [phone]. If 7PM-7AM, please contact night-coverage at [email or web address], password [facility] 05/01/2013, 2:18 PM  LOS: 1 day

## 2013-05-01 NOTE — Progress Notes (Signed)
ANTICOAGULATION CONSULT NOTE - Follow Up Consult  Pharmacy Consult for Warfarin Indication: atrial fibrillation  Allergies  Allergen Reactions  . Prednisone Other (See Comments)    Raised blood sugar    Patient Measurements: Weight: 200 lb (90.719 kg)  Vital Signs: Temp: 97.7 F (36.5 C) (08/10 0830) Temp src: Axillary (08/10 0830) BP: 143/56 mmHg (08/10 0830) Pulse Rate: 67 (08/10 0830)  Labs:  Recent Labs  04/30/13 0930 04/30/13 1924 04/30/13 2320 05/01/13 0400 05/01/13 0733  HGB 14.8  --   --   --   --   HCT 43.9  --   --   --   --   PLT 100*  --   --   --   --   APTT 44*  --   --   --   --   LABPROT 20.2*  --   --  21.6*  --   INR 1.78*  --   --  1.94*  --   CREATININE 2.80*  --   --   --  3.17*  TROPONINI <0.30 <0.30 <0.30  --  <0.30    The CrCl is unknown because both a height and weight (above a minimum accepted value) are required for this calculation.   Medications:  Warfarin PTA Dose: 2.5mg  Tues/Thurs, 5mg  all other days  Assessment: 79 y/o M on warfarin for afib s/p PPM presents to the ED with vertigo. INR on admit was below goal at 1.78 and pt was given a boosted dose of 7.5mg  (although this was not charted and dayshift RN doesn't know anything about this). INR did go up to 1.94<1.78, so will assume dose was given. CBC good. CKD with Scr 3.17<2.80. No overt bleeding.   Goal of Therapy:  INR 2-3 Monitor platelets by anticoagulation protocol: Yes   Plan:  -Warfarin 5 mg PO x 1 at 1800 -Daily PT/INR -Monitor for bleeding  Thank you for allowing me to take part in this patient's care,  Abran Duke, PharmD Clinical Pharmacist Phone: [phone] Pager: [phone] 05/01/2013 10:25 AM

## 2013-05-01 NOTE — Progress Notes (Signed)
UR Completed.  Townsend Cudworth Jane [phone] 05/01/2013  

## 2013-05-01 NOTE — Progress Notes (Signed)
NEURO HOSPITALIST PROGRESS NOTE   SUBJECTIVE:                                                                                                                        Stated that he is feeling better today. Vertigo with head turning to the left is not happening all the time as before. Denies double vision, difficulty swallowing, slurred speech, language or vision impairment. No reported confusion. No ambulatory at this moment. Carotid ultrasound showed no hemodynamically significant carotid disease. On coumadin with INR 1.94  OBJECTIVE:                                                                                                                           Vital signs in last 24 hours: Temp:  [97.6 F (36.4 C)-98.4 F (36.9 C)] 97.6 F (36.4 C) (08/10 1308) Pulse Rate:  [67-76] 76 (08/10 1308) Resp:  [16-18] 18 (08/10 1308) BP: (119-145)/(48-68) 131/61 mmHg (08/10 1308) SpO2:  [89 %-97 %] 94 % (08/10 1308) Weight:  [90.719 kg (200 lb)] 90.719 kg (200 lb) (08/09 1700)  Intake/Output from previous day: 08/09 0701 - 08/10 0700 In: 120 [P.O.:120] Out: -  Intake/Output this shift: Total I/O In: -  Out: 250 [Urine:250] Nutritional status: Carb Control  Past Medical History  Diagnosis Date  . Atrial fibrillation   . CARDIOMYOPATHY, PRIMARY, DILATED   . COLON CANCER, HX OF   . CONGESTIVE HEART FAILURE   . CORONARY ARTERY DISEASE   . PROSTATE CANCER, HX OF   . RENAL DISEASE, CHRONIC, STAGE III   . SKIN CANCER, HX OF   . SLEEP APNEA   . Hypertension   . Hyperlipidemia   . Diabetes mellitus   . Hyperkalemia   . Arthritis   . Osteoarthritis   . Tubular adenoma of colon   . Secondary hyperparathyroidism   . Automatic implantable cardioverter-defibrillator in situ   . Shortness of breath   . Pacemaker     Neurologic Exam:  Mental Status:  Alert, awake, oriented x 4, thought content appropriate. Speech fluent without evidence of  aphasia. Able to follow 3 step commands without difficulty.  Cranial Nerves:  II: Discs flat in the right, s/p prosthesis left eye ; Visual  fields grossly normal in the right, s/p prosthesis in the left. Right Pupil 4 mm, reactive to light, s/p left prosthesis.  III,IV, VI: ptosis not present, extra-ocular motions intact in the right, s/p prosthesis in the left.  V,VII: smile symmetric, facial light touch sensation normal bilaterally  VIII: hearing normal bilaterally  IX,X: gag reflex present  XI: bilateral shoulder shrug  XII: midline tongue extension  Motor:  Right : Upper extremity 5/5 Left: Upper extremity 5/5  Lower extremity 5/5 Lower extremity 5/5  Tone and bulk:normal tone throughout; no atrophy noted  Sensory: Pinprick and light touch intact throughout, bilaterally  Deep Tendon Reflexes:  1+ all over  Plantars:  Right: downgoing Left: downgoing  Cerebellar:  normal finger-to-nose, normal heel-to-shin test  Gait:  No tested due to positional vertigo.  CV: pulses palpable throughout    Lab Results: Lab Results  Component Value Date/Time   CHOL 149 05/01/2013  7:33 AM   Lipid Panel  Recent Labs  05/01/13 0733  CHOL 149  TRIG 201*  HDL 28*  CHOLHDL 5.3  VLDL 40  LDLCALC 81    Studies/Results: Dg Chest 2 View  04/30/2013   *RADIOLOGY REPORT*  Clinical Data: Dizziness  CHEST - 2 VIEW  Comparison: 03/26/2012  Findings: Heart is moderately enlarged.  Left subclavian AICD device and leads are stable and intact.  Small bilateral pleural effusions and bibasilar atelectasis.  Upper lungs clear.  Normal vascularity.  No pneumothorax.  IMPRESSION: Small bilateral pleural effusions and bibasilar atelectasis.   Original Report Authenticated By: Jolaine Click, M.D.   Ct Head Wo Contrast  04/30/2013   *RADIOLOGY REPORT*  Clinical Data: Dizziness, vomiting, nausea.  History of colon and prostate carcinoma.  CT HEAD WITHOUT CONTRAST  Technique:  Contiguous axial images were obtained  from the base of the skull through the vertex without contrast.  Comparison: 12/27/2009  Findings: Atherosclerotic and physiologic intracranial calcifications.  Stable punctate calcification in the pons.  Early left basal ganglia mineralization as before. Diffuse parenchymal atrophy. Patchy areas of hypoattenuation in deep and periventricular white matter bilaterally. Negative for acute intracranial hemorrhage, mass lesion, acute infarction, midline shift, or mass-effect. Acute infarct may be inapparent on noncontrast CT. Ventricles and sulci symmetric. Bone windows demonstrate no focal lesion.  IMPRESSION:  1. Negative for bleed or other acute intracranial process.  2. Atrophy and nonspecific white matter changes   Original Report Authenticated By: D. Andria Rhein, MD    MEDICATIONS                                                                                                                       I have reviewed the patient's current medications.  ASSESSMENT/PLAN:  79 y/o with multiple risk factors for stroke and new onset episodic vertigo but non focal neuro-exam. 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 Continue coumadin as per pharmacy recommendations. Needs vestibular therapy/Eply exercises. Will continue to follow.  [name] [name] ,MD [facility] [phone]  05/01/2013, 2:39 PM

## 2013-05-01 NOTE — Progress Notes (Signed)
VASCULAR LAB PRELIMINARY  PRELIMINARY  PRELIMINARY  PRELIMINARY  Carotid Dopplers completed.    Preliminary report:  There is 1-39% ICA stenosis.  Vertebral artery flow is antegrade.  Adrionna Delcid, RVT 05/01/2013, 11:33 AM

## 2013-05-02 ENCOUNTER — Telehealth: Payer: Self-pay | Admitting: Internal Medicine

## 2013-05-02 DIAGNOSIS — R42 Dizziness and giddiness: Secondary | ICD-10-CM

## 2013-05-02 LAB — COMPREHENSIVE METABOLIC PANEL
Alkaline Phosphatase: 32 U/L — ABNORMAL LOW (ref 39–117)
BUN: 56 mg/dL — ABNORMAL HIGH (ref 6–23)
Chloride: 105 mEq/L (ref 96–112)
Creatinine, Ser: 3.2 mg/dL — ABNORMAL HIGH (ref 0.50–1.35)
GFR calc Af Amer: 20 mL/min — ABNORMAL LOW (ref >90)
GFR calc non Af Amer: 17 mL/min — ABNORMAL LOW (ref >90)
Glucose, Bld: 174 mg/dL — ABNORMAL HIGH (ref 70–99)
Potassium: 3.6 mEq/L (ref 3.5–5.1)
Total Bilirubin: 0.7 mg/dL (ref 0.3–1.2)

## 2013-05-02 LAB — PROTIME-INR: Prothrombin Time: 19.2 seconds — ABNORMAL HIGH (ref 11.6–15.2)

## 2013-05-02 NOTE — Evaluation (Signed)
Occupational Therapy Evaluation Patient Details Name: [name] [name]. MRN: [id number] DOB: 1930/12/06 Today's Date: 05/02/2013 Time: 4098-1191 OT Time Calculation (min): 38 min  OT Assessment / Plan / Recommendation History of present illness Patient is an 79 yo male admitted with vertigo.   Clinical Impression   Pt presents with balance deficits interfering with safety and independence with ADL.  Educated pt in safety and recommended return to use of shower seat with wife's supervision. Pt is very well aware of deficits.  Agree with OPPT to address BPPV.    OT Assessment  Patient does not need any further OT services    Follow Up Recommendations  No OT follow up;Supervision/Assistance - 24 hour    Barriers to Discharge      Equipment Recommendations  None recommended by OT    Recommendations for Other Services    Frequency       Precautions / Restrictions Precautions Precautions: Fall   Pertinent Vitals/Pain Monitored throughout, pt with afib, RN aware, no pain.    ADL  Eating/Feeding: Independent Where Assessed - Eating/Feeding: Bed level Grooming: Wash/dry hands;Supervision/safety Where Assessed - Grooming: Unsupported standing Upper Body Bathing: Set up Where Assessed - Upper Body Bathing: Unsupported sitting Lower Body Bathing: Supervision/safety Where Assessed - Lower Body Bathing: Unsupported sitting;Supported sit to stand Upper Body Dressing: Set up Where Assessed - Upper Body Dressing: Unsupported sitting Lower Body Dressing: Supervision/safety Where Assessed - Lower Body Dressing: Unsupported sitting;Supported sit to stand Toilet Transfer: Min Pension scheme manager Method: Sit to Barista: Regular height toilet;Grab bars Toileting - Clothing Manipulation and Hygiene: Modified independent Where Assessed - Engineer, mining and Hygiene: Sit on 3-in-1 or toilet Equipment Used: Gait belt;Rolling  walker Transfers/Ambulation Related to ADLs: min guard with RW, mild LOB toward R ADL Comments: Pt brings his foot to his opposite knee for LB bathing and dressing, does not bend forward.  Will resume use of shower seat for safety at home.  Wife can supervise transfer    OT Diagnosis:    OT Problem List:   OT Treatment Interventions:     OT Goals(Current goals can be found in the care plan section) Acute Rehab OT Goals Patient Stated Goal: To stop being dizzy  Visit Information  Last OT Received On: 05/02/13 Assistance Needed: +1 History of Present Illness: Patient is an 79 yo male admitted with vertigo.       Prior Functioning     Home Living Family/patient expects to be discharged to:: Private residence Living Arrangements: Spouse/significant other Available Help at Discharge: Family;Available 24 hours/day Type of Home: House Home Access: Stairs to enter [employer] of Steps: 1 Entrance Stairs-Rails: None Home Layout: Two level;Bed/bath upstairs Alternate Level Stairs-Number of Steps: 6 x2 (with landing between) Alternate Level Stairs-Rails: Right Home Equipment: Walker - 2 wheels;Shower seat;Cane - single point Prior Function Level of Independence: Independent Comments: Patient drives Communication Communication: No difficulties Dominant Hand: Right         Vision/Perception Vision - History Baseline Vision: Wears glasses all the time (prosthetic L eye) Patient Visual Report: No change from baseline   Cognition  Cognition Arousal/Alertness: Awake/alert Behavior During Therapy: WFL for tasks assessed/performed Overall Cognitive Status: Within Functional Limits for tasks assessed    Extremity/Trunk Assessment Upper Extremity Assessment Upper Extremity Assessment: Overall WFL for tasks assessed Lower Extremity Assessment Lower Extremity Assessment: Defer to PT evaluation Cervical / Trunk Assessment Cervical / Trunk Assessment: Normal      Mobility Bed  Mobility Bed Mobility: Rolling Left;Left Sidelying to Sit Rolling Left: 7: Independent Left Sidelying to Sit: 7: Independent Transfers Sit to Stand: 5: Supervision;From bed;From toilet Stand to Sit: 5: Supervision;To bed;To toilet Details for Transfer Assistance: verbal cues for hand placement on walker, supervision for balance.     Exercise     Balance Static Sitting Balance Static Sitting - Balance Support: No upper extremity supported;Feet supported Static Standing Balance Static Standing - Level of Assistance: 5: Stand by assistance   End of Session OT - End of Session Activity Tolerance: Patient tolerated treatment well Patient left:  (with PT)  GO Functional Assessment Tool Used: clinical judgement Functional Limitation: Self care Self Care Current Status (Q6578): At least 1 percent but less than 20 percent impaired, limited or restricted Self Care Goal Status (I6962): At least 1 percent but less than 20 percent impaired, limited or restricted Self Care Discharge Status [phone]): At least 1 percent but less than 20 percent impaired, limited or restricted   Evern Bio 05/02/2013, 9:42 AM [phone]

## 2013-05-02 NOTE — H&P (Signed)
[name] Millin, MD Physician Addendum Internal Medicine Discharge Summaries Service date: 04/30/2013 5:57 PM  [facility] History and Physical   [name] [name]. ZOX:[id number] DOB: 1931/06/25 DOA: 04/30/2013   Referring physician:  PCP: [name] Petit, MD   Specialists:    Chief Complaint: Vertigo   HPI: [name] [name]. is a 79 y.o. male with history of atrial fibrillation status post a pacemaker in the past, CKD STAGE III, DM and multiple medical problems as listed below who presents with above complaints. he states that when he woke up this morning with it felt like the room was spinning and he was unable to keep his balance. He drank some juice thinking that it might be related to hypoglycemia but that did not help. He admits to associated nausea and vomiting. He also states that turning his head from side to side seemed to precipitate the vertigo. He was seen at the [facility] ED and a CT scan of his head was negative. He denies headaches, blurry vision, focal weakness, dysphagia, chest pain, cough, dysuria and no fevers. He is admitted for further evaluation and management.   Review of Systems: The patient denies anorexia, fever, weight loss,, vision loss, decreased hearing, hoarseness, chest pain, syncope, dyspnea on exertion, peripheral edema, balance deficits, hemoptysis, abdominal pain, melena, hematochezia, severe indigestion/heartburn, hematuria, incontinence, genital sores, muscle weakness, suspicious skin lesions, transient blindness, depression, unusual weight change, abnormal bleeding.    Past Medical History   Diagnosis  Date   .  Atrial fibrillation     .  CARDIOMYOPATHY, PRIMARY, DILATED     .  COLON CANCER, HX OF     .  CONGESTIVE HEART FAILURE     .  CORONARY ARTERY DISEASE     .  PROSTATE CANCER, HX OF     .  RENAL DISEASE, CHRONIC, STAGE III     .  SKIN CANCER, HX OF     .  SLEEP APNEA     .  Hypertension     .  Hyperlipidemia     .  Diabetes  mellitus     .  Hyperkalemia     .  Arthritis     .  Osteoarthritis     .  Tubular adenoma of colon     .  Secondary hyperparathyroidism     .  Automatic implantable cardioverter-defibrillator in situ     .  Shortness of breath     .  Pacemaker      Past Surgical History   Procedure  Laterality  Date   .  Colectomy       .  Prostatectomy       .  Penile prosthesis placement       .  Removed prosthetic eye       .  Ptca           stent placed   .  Total knee arthroplasty    2008       right   .  St jude unify           St. Jude single chamber defibrillator 07/19/09 (Dr. Graciela Husbands)   .  Pacemaker insertion    2010   .  Av fistula placement    03/30/2012       Procedure: ARTERIOVENOUS (AV) FISTULA CREATION;  Surgeon: Chuck Hint, MD;  Location: [facility] OR;  Service: Vascular;  Laterality: Right;  Creation of BrachioCephalic Fistula Right arm   .  Cardiac catheterization       .  Coronary angioplasty       .  Eye surgery           left eye prothesis   .  Fracture surgery           collar bone, right knee replacement   .  Insert / replace / remove pacemaker        Social History: reports that he quit smoking about 46 years ago. His smoking use included Cigarettes. He smoked 0.00 packs per day. He has never used smokeless tobacco. He reports that  drinks alcohol. He reports that he does not use illicit drugs.  where does patient live--home  Can patient participate in ADL-yes    Allergies   Allergen  Reactions   .  Prednisone  Other (See Comments)       Raised blood sugar       Family History   Problem  Relation  Age of Onset   .  Heart disease  Father     .  Throat cancer  Father     .  Throat cancer  Mother     .  Heart disease  Mother     .  Hypertension  Mother      Prior to Admission medications    Medication  Sig  Start Date  End Date  Taking?  Authorizing Provider   aspirin 81 MG chewable tablet  Chew 81 mg by mouth daily.      Yes  Historical Provider, MD    calcitRIOL (ROCALTROL) 0.25 MCG capsule  Take 0.25 mcg by mouth daily.       Yes  Historical Provider, MD   digoxin (LANOXIN) 0.125 MG tablet  Take 0.125 mg by mouth daily.      Yes  Historical Provider, MD   furosemide (LASIX) 80 MG tablet  Take 80 mg by mouth daily.      Yes  Historical Provider, MD   glipiZIDE (GLUCOTROL) 5 MG tablet  Take 0.5 tablets (2.5 mg total) by mouth 2 (two) times daily before a meal.  01/31/13    Yes  Bruce Rexene Edison Swords, MD   glucose blood (FREESTYLE TEST STRIPS) test strip  1 each by Other route as needed. Use as instructed       Yes  Historical Provider, MD   hydrALAZINE (APRESOLINE) 25 MG tablet  Take 25 mg by mouth 3 (three) times daily.  10/06/12    Yes  Bruce Romilda Garret, MD   insulin lispro protamine-lispro (HUMALOG 75/25) (75-25) 100 UNIT/ML SUSP injection  Inject 45 Units into the skin 2 (two) times daily with a meal.      Yes  Historical Provider, MD   isosorbide mononitrate (IMDUR) 30 MG 24 hr tablet  Take 0.5 tablets (15 mg total) by mouth daily.  01/31/13    Yes  Lindley Magnus, MD   metoprolol succinate (TOPROL-XL) 100 MG 24 hr tablet  Take 100 mg by mouth 2 (two) times daily. Take with or immediately following a meal.      Yes  Historical Provider, MD   potassium chloride SA (K-DUR,KLOR-CON) 20 MEQ tablet  Take 20 mEq by mouth 2 (two) times daily.      Yes  Historical Provider, MD   warfarin (COUMADIN) 5 MG tablet  Take 2.5-5 mg by mouth daily. Sunday, Monday, Wednesday, Friday, Saturday takes 5mg ; Tuesday, Thursday takes 2.5mg       Yes  Historical Provider, MD   nitroGLYCERIN (NITROSTAT) 0.4 MG SL tablet  Place 0.4 mg under the tongue every 5 (five) minutes x 3 doses as needed for chest pain.        Historical Provider, MD      Physical Exam: Filed Vitals:     04/30/13 1430   BP:  157/84   Pulse:  71   Temp:     Resp:  18      Constitutional: Vital signs reviewed.  Patient is a well-developed and well-nourished  in no acute distress and cooperative with  exam. Alert and oriented x3. No facial asymmetry Head: Normocephalic and atraumatic Nose: No erythema or drainage noted.  Turbinates normal Mouth: no erythema or exudates, MMM Eyes: PERRL, EOMI, conjunctivae normal, No scleral icterus.  Neck: Supple, Trachea midline normal ROM, No JVD, mass, thyromegaly, or carotid bruit present.  Cardiovascular: Irreg, irreg, S1 normal, S2 normal, no MRG, pulses symmetric and intact bilaterally Pulmonary/Chest: normal respiratory effort, CTAB, no wheezes, rales, or rhonchi Abdominal: Soft. Non-tender, non-distended, bowel sounds are normal, no masses, organomegaly, or guarding present.   GU: no CVA tenderness Extremities: No cyanosis, no edema Neurological: A&O x3, Strength is normal and symmetric bilaterally, cranial nerve II-XII are grossly intact, no focal motor deficit, sensory intact to light touch bilaterally.  Skin: Warm, dry and intact. No rash.  Psychiatric: Normal mood and affect. speech and behavior is normal. Judgment and thought content normal. Cognition and memory are normal.        Labs on Admission:  Basic Metabolic Panel: Recent Labs Lab  04/30/13 0930   NA  141   K  4.4   CL  102   CO2  25   GLUCOSE  193*   BUN  48*   CREATININE  2.80*   CALCIUM  9.9    Liver Function Tests: Recent Labs Lab  04/30/13 0930   AST  32   ALT  18   ALKPHOS  36*   BILITOT  0.8   PROT  6.8   ALBUMIN  3.7    No results found for this basename: LIPASE, AMYLASE,  in the last 168 hours No results found for this basename: AMMONIA,  in the last 168 hours CBC: Recent Labs Lab  04/30/13 0930   WBC  6.6   NEUTROABS  4.5   HGB  14.8   HCT  43.9   MCV  94.0   PLT  100*    Cardiac Enzymes: Recent Labs Lab  04/30/13 0930   TROPONINI  <0.30      BNP (last 3 results) No results found for this basename: PROBNP,  in the last 8760 hours CBG: Recent Labs Lab  04/30/13 0922  04/30/13 1507   GLUCAP  177*  178*      Radiological  Exams on Admission: Ct Head Wo Contrast   04/30/2013   *RADIOLOGY REPORT*  Clinical Data: Dizziness, vomiting, nausea.  History of colon and prostate carcinoma.  CT HEAD WITHOUT CONTRAST  Technique:  Contiguous axial images were obtained from the base of the skull through the vertex without contrast.  Comparison: 12/27/2009  Findings: Atherosclerotic and physiologic intracranial calcifications.  Stable punctate calcification in the pons.  Early left basal ganglia mineralization as before. Diffuse parenchymal atrophy. Patchy areas of hypoattenuation in deep and periventricular white matter bilaterally. Negative for acute intracranial hemorrhage, mass lesion, acute infarction, midline shift, or mass-effect. Acute infarct may be inapparent on noncontrast CT. Ventricles and sulci symmetric.  Bone windows demonstrate no focal lesion.  IMPRESSION:  1. Negative for bleed or other acute intracranial process.  2. Atrophy and nonspecific white matter changes   Original Report Authenticated By: D. Andria Rhein, MD       Assessment/Plan Active Problems: Vertigo -central versus peripheral -As discussed above, in patient with multiple risk factors for CVA-concern about possible posterior circulation infarction -Due to his pacemaker unable to get MRI, we'll consult neuro for further recommendations -Will obtain carotid Doppler, echo, arrest of CVA w/u -Continue aspirin 81 mg on Coumadin-his INR is subtherapeutic, pharmacy to follow.   DIABETES MELLITUS, TYPE II -Continue 70/25 and glipizide -Monitor Accu-Cheks and cover with sliding scale insulin   HYPERTENSION -Continue outpatient medications with hold parameters for the hydralazine   CARDIOMYOPATHY, PRIMARY, DILATED/CHF -Compensated, continue outpatient medications   Atrial fibrillation  -Rate controlled, continue beta blockers and anticoagulation with Coumadin as above   SLEEP APNEA -Continue CPAP each bedtime   Chronic renal insufficiency, stage IV  (severe) -Stable, follow         Code Status: full Family Communication: wife at bedside Disposition Plan: admit to tele Time spent: >70mins   [name] [name] [facility] Pager [phone]   If 7PM-7AM, please contact night-coverage [email or web address] Password [email or web address] 04/30/2013, 5:57 PM

## 2013-05-02 NOTE — Care Management Note (Signed)
    Page 1 of 1   05/02/2013     10:55:52 AM   CARE MANAGEMENT NOTE 05/02/2013  Patient:  [name] [name], [name] [name]   Account Number:  [id number]  Date Initiated:  05/02/2013  Documentation initiated by:  GRAVES-BIGELOW,Ermin Parisien  Subjective/Objective Assessment:   Pt admitted with vertigo and plans to return home with wife. Pt [facility] Rehab will call pt for appointment time.     Action/Plan:   Cm did fax over PT notes and order. No further needs from CM at this time.   Anticipated DC Date:  05/02/2013   Anticipated DC Plan:  HOME/SELF CARE      DC Planning Services  CM consult      Choice offered to / List presented to:             Status of service:  Completed, signed off Medicare Important Message given?   (If response is "NO", the following Medicare IM given date fields will be blank) Date Medicare IM given:   Date Additional Medicare IM given:    Discharge Disposition:  HOME/SELF CARE  Per UR Regulation:  Reviewed for med. necessity/level of care/duration of stay  If discussed at Long Length of Stay Meetings, dates discussed:    Comments:

## 2013-05-02 NOTE — Progress Notes (Signed)
Physical Therapy Treatment Patient Details Name: [name] [name]. MRN: [id number] DOB: March 25, 1931 Today's Date: 05/02/2013 Time: 1478-2956 PT Time Calculation (min): 40 min  PT Assessment / Plan / Recommendation  History of Present Illness Patient is an 79 yo male admitted with vertigo.   PT Comments   Patient today with right posterior canal BPPV treated effectively with Eply's.  Agree with outpatient PT follow up for balance training and further vestibular rehab.  Needs assist for safety with mobility with walker due to balance deficit and pt confident wife can provide assist.  No further acute PT needs due to plans for d/c home today.  Follow Up Recommendations  Outpatient PT;Supervision/Assistance - 24 hour           Equipment Recommendations  None recommended by PT       Frequency Min 3X/week   Progress towards PT Goals Progress towards PT goals: Progressing toward goals  Plan Current plan remains appropriate    Precautions / Restrictions Precautions Precautions: Fall   Pertinent Vitals/Pain No pain complaints, HR a-fib 92 with ambulation    Mobility  Bed Mobility Bed Mobility: Not assessed Rolling Left: 7: Independent Left Sidelying to Sit: 7: Independent Details for Bed Mobility Assistance: performed long sit <> supine for hall pike and Eply's with assist for head movement and safety Transfers Sit to Stand: From bed;5: Supervision;4: Min guard Stand to Sit: To bed;To chair/3-in-1;5: Supervision;4: Min guard Details for Transfer Assistance: initial supervision, then with LOB to right when initially standing, needing minguard for safety Ambulation/Gait Ambulation/Gait Assistance: 5: Supervision;4: Min guard Ambulation Distance (Feet): 150 Feet Assistive device: Rolling walker Ambulation/Gait Assistance Details: c/o imbalance with head turns, cues for forward gaze and demonstrates 2-3 losses of balance during ambulation with minguard for safety Gait Pattern:  Step-through pattern;Decreased stride length;Decreased trunk rotation;Wide base of support Stairs: Yes Stairs Assistance: 4: Min guard Stairs Assistance Details (indicate cue type and reason): assist for safety, step through for ascending stairs, step to for descent Stair Management Technique: One rail Left;Alternating pattern;Step to pattern;Forwards Number of Stairs: 10    Exercises Other Exercises Other Exercises: performed left dix hallpike, negative for symptoms or nystagmus, supine head roll negative to left, positive to right with right rotary nystagmus, right hallpike positive for rotary nystagmus lasting about 10-15 seconds duration.  Performed Eply's x 2 for right posterior canal BPPV.  Noted no nystagmus with repeat hallpike.   Other Exercises: Patient educated to have assist from wife when up walking with walker and on stairs.  Reviewed fall prevention information and pt verbalized understanding.     PT Goals (current goals can now be found in the care plan section) Acute Rehab PT Goals Patient Stated Goal: To stop being dizzy  Visit Information  Last PT Received On: 05/02/13 Assistance Needed: +1 History of Present Illness: Patient is an 79 yo male admitted with vertigo.    Subjective Data  Patient reports noted spinning last pm when turning to right.   Cognition  Cognition Arousal/Alertness: Awake/alert Behavior During Therapy: WFL for tasks assessed/performed Overall Cognitive Status: Within Functional Limits for tasks assessed    Balance  Static Sitting Balance Static Sitting - Balance Support: No upper extremity supported;Feet supported Static Standing Balance Static Standing - Balance Support: Bilateral upper extremity supported Static Standing - Level of Assistance: 5: Stand by assistance  End of Session PT - End of Session Equipment Utilized During Treatment: Gait belt Activity Tolerance: Patient tolerated treatment well Patient left: in chair;with  call  bell/phone within reach Nurse Communication: Other (comment) (ready for d/c home with outpatient PT follow up)   GP Functional Assessment Tool Used: clinical judgement Functional Limitation: Mobility: Walking and moving around Mobility: Walking and Moving Around Goal Status [phone]): 0 percent impaired, limited or restricted Mobility: Walking and Moving Around Discharge Status [phone]): At least 1 percent but less than 20 percent impaired, limited or restricted   [facility] 05/02/2013, 10:45 AM Sheran Lawless, PT [phone] 05/02/2013

## 2013-05-02 NOTE — Telephone Encounter (Signed)
Pt was in hospital w/ vertigo 2 days. Pt needs fup w/ pcp. refused to see Oran Rein stating he wants someone that knows him like Dr Cato Mulligan. pls advise.

## 2013-05-02 NOTE — Discharge Summary (Deleted)
[facility] History and Physical  Jaicob Dia. BJY:[id number] DOB: 03-28-1931 DOA: 04/30/2013  Referring physician:  PCP: Judie Petit, MD  Specialists:   Chief Complaint: Vertigo  HPI: [name] [name]. is a 79 y.o. male with history of atrial fibrillation status post a pacemaker in the past, CKD STAGE III, DM and multiple medical problems as listed below who presents with above complaints. he states that when he woke up this morning with it felt like the room was spinning and he was unable to keep his balance. He drank some juice thinking that it might be related to hypoglycemia but that did not help. He admits to associated nausea and vomiting. He also states that turning his head from side to side seemed to precipitate the vertigo. He was seen at the [facility] ED and a CT scan of his head was negative. He denies headaches, blurry vision, focal weakness, dysphagia, chest pain, cough, dysuria and no fevers. He is admitted for further evaluation and management.  Review of Systems: The patient denies anorexia, fever, weight loss,, vision loss, decreased hearing, hoarseness, chest pain, syncope, dyspnea on exertion, peripheral edema, balance deficits, hemoptysis, abdominal pain, melena, hematochezia, severe indigestion/heartburn, hematuria, incontinence, genital sores, muscle weakness, suspicious skin lesions, transient blindness, depression, unusual weight change, abnormal bleeding.  Past Medical History  Diagnosis Date  . Atrial fibrillation   . CARDIOMYOPATHY, PRIMARY, DILATED   . COLON CANCER, HX OF   . CONGESTIVE HEART FAILURE   . CORONARY ARTERY DISEASE   . PROSTATE CANCER, HX OF   . RENAL DISEASE, CHRONIC, STAGE III   . SKIN CANCER, HX OF   . SLEEP APNEA   . Hypertension   . Hyperlipidemia   . Diabetes mellitus   . Hyperkalemia   . Arthritis   . Osteoarthritis   . Tubular adenoma of colon   . Secondary hyperparathyroidism   . Automatic implantable  cardioverter-defibrillator in situ   . Shortness of breath   . Pacemaker    Past Surgical History  Procedure Laterality Date  . Colectomy    . Prostatectomy    . Penile prosthesis placement    . Removed prosthetic eye    . Ptca      stent placed  . Total knee arthroplasty  2008    right  . St jude unify      St. Jude single chamber defibrillator 07/19/09 (Dr. Graciela Husbands)  . Pacemaker insertion  2010  . Av fistula placement  03/30/2012    Procedure: ARTERIOVENOUS (AV) FISTULA CREATION;  Surgeon: Chuck Hint, MD;  Location: [facility] OR;  Service: Vascular;  Laterality: Right;  Creation of BrachioCephalic Fistula Right arm  . Cardiac catheterization    . Coronary angioplasty    . Eye surgery      left eye prothesis  . Fracture surgery      collar bone, right knee replacement  . Insert / replace / remove pacemaker     Social History:  reports that he quit smoking about 46 years ago. His smoking use included Cigarettes. He smoked 0.00 packs per day. He has never used smokeless tobacco. He reports that  drinks alcohol. He reports that he does not use illicit drugs.  where does patient live--home  Can patient participate in ADL-yes  Allergies  Allergen Reactions  . Prednisone Other (See Comments)    Raised blood sugar    Family History  Problem Relation Age of Onset  . Heart disease Father   .  Throat cancer Father   . Throat cancer Mother   . Heart disease Mother   . Hypertension Mother    Prior to Admission medications   Medication Sig Start Date End Date Taking? Authorizing Provider  aspirin 81 MG chewable tablet Chew 81 mg by mouth daily.   Yes Historical Provider, MD  calcitRIOL (ROCALTROL) 0.25 MCG capsule Take 0.25 mcg by mouth daily.    Yes Historical Provider, MD  digoxin (LANOXIN) 0.125 MG tablet Take 0.125 mg by mouth daily.   Yes Historical Provider, MD  furosemide (LASIX) 80 MG tablet Take 80 mg by mouth daily.   Yes Historical Provider, MD  glipiZIDE (GLUCOTROL)  5 MG tablet Take 0.5 tablets (2.5 mg total) by mouth 2 (two) times daily before a meal. 01/31/13  Yes Bruce Rexene Edison Swords, MD  glucose blood (FREESTYLE TEST STRIPS) test strip 1 each by Other route as needed. Use as instructed    Yes Historical Provider, MD  hydrALAZINE (APRESOLINE) 25 MG tablet Take 25 mg by mouth 3 (three) times daily. 10/06/12  Yes Bruce Romilda Garret, MD  insulin lispro protamine-lispro (HUMALOG 75/25) (75-25) 100 UNIT/ML SUSP injection Inject 45 Units into the skin 2 (two) times daily with a meal.   Yes Historical Provider, MD  isosorbide mononitrate (IMDUR) 30 MG 24 hr tablet Take 0.5 tablets (15 mg total) by mouth daily. 01/31/13  Yes Lindley Magnus, MD  metoprolol succinate (TOPROL-XL) 100 MG 24 hr tablet Take 100 mg by mouth 2 (two) times daily. Take with or immediately following a meal.   Yes Historical Provider, MD  potassium chloride SA (K-DUR,KLOR-CON) 20 MEQ tablet Take 20 mEq by mouth 2 (two) times daily.   Yes Historical Provider, MD  warfarin (COUMADIN) 5 MG tablet Take 2.5-5 mg by mouth daily. Sunday, Monday, Wednesday, Friday, Saturday takes 5mg ; Tuesday, Thursday takes 2.5mg    Yes Historical Provider, MD  nitroGLYCERIN (NITROSTAT) 0.4 MG SL tablet Place 0.4 mg under the tongue every 5 (five) minutes x 3 doses as needed for chest pain.    Historical Provider, MD   Physical Exam: Filed Vitals:  04/30/13 1430  BP:  157/84  Pulse:  71  Temp:  Resp:  18    Constitutional: Vital signs reviewed.  Patient is a well-developed and well-nourished  in no acute distress and cooperative with exam. Alert and oriented x3. No facial asymmetry Head: Normocephalic and atraumatic Nose: No erythema or drainage noted.  Turbinates normal Mouth: no erythema or exudates, MMM Eyes: PERRL, EOMI, conjunctivae normal, No scleral icterus.  Neck: Supple, Trachea midline normal ROM, No JVD, mass, thyromegaly, or carotid bruit present.  Cardiovascular: Irreg, irreg, S1 normal, S2 normal, no MRG,  pulses symmetric and intact bilaterally Pulmonary/Chest: normal respiratory effort, CTAB, no wheezes, rales, or rhonchi Abdominal: Soft. Non-tender, non-distended, bowel sounds are normal, no masses, organomegaly, or guarding present.  GU: no CVA tenderness Extremities: No cyanosis, no edema Neurological: A&O x3, Strength is normal and symmetric bilaterally, cranial nerve II-XII are grossly intact, no focal motor deficit, sensory intact to light touch bilaterally.  Skin: Warm, dry and intact. No rash.  Psychiatric: Normal mood and affect. speech and behavior is normal. Judgment and thought content normal. Cognition and memory are normal.     Labs on Admission:  Basic Metabolic Panel:    No results found for this basename: LIPASE, AMYLASE,  in the last 168 hours No results found for this basename: AMMONIA,  in the last 168 hours CBC:  Recent Labs  Lab 04/30/13 0930  WBC 6.6  NEUTROABS 4.5  HGB 14.8  HCT 43.9  MCV 94.0  PLT 100*   Cardiac Enzymes:  Recent Labs Lab 04/30/13 0930 04/30/13 1924     TROPONINI <0.30 <0.30      BNP (last 3 results) No results found for this basename: PROBNP,  in the last 8760 hours CBG:    Radiological Exams on Admission: Dg Chest 2 View  04/30/2013   *RADIOLOGY REPORT*  Clinical Data: Dizziness  CHEST - 2 VIEW  Comparison: 03/26/2012  Findings: Heart is moderately enlarged.  Left subclavian AICD device and leads are stable and intact.  Small bilateral pleural effusions and bibasilar atelectasis.  Upper lungs clear.  Normal vascularity.  No pneumothorax.  IMPRESSION: Small bilateral pleural effusions and bibasilar atelectasis.   Original Report Authenticated By: Jolaine Click, M.D.   Ct Head Wo Contrast  04/30/2013   *RADIOLOGY REPORT*  Clinical Data: Dizziness, vomiting, nausea.  History of colon and prostate carcinoma.  CT HEAD WITHOUT CONTRAST  Technique:  Contiguous axial images were obtained from the base of the skull through the vertex  without contrast.  Comparison: 12/27/2009  Findings: Atherosclerotic and physiologic intracranial calcifications.  Stable punctate calcification in the pons.  Early left basal ganglia mineralization as before. Diffuse parenchymal atrophy. Patchy areas of hypoattenuation in deep and periventricular white matter bilaterally. Negative for acute intracranial hemorrhage, mass lesion, acute infarction, midline shift, or mass-effect. Acute infarct may be inapparent on noncontrast CT. Ventricles and sulci symmetric. Bone windows demonstrate no focal lesion.  IMPRESSION:  1. Negative for bleed or other acute intracranial process.  2. Atrophy and nonspecific white matter changes   Original Report Authenticated By: D. Andria Rhein, MD     Assessment/Plan Active Problems: Vertigo -central versus peripheral -As discussed above, in patient with multiple risk factors for CVA-concern about possible posterior circulation infarction -Due to his pacemaker unable to get MRI, we'll consult neuro for further recommendations -Will obtain carotid Doppler, echo, arrest of CVA w/u -Continue aspirin 81 mg on Coumadin-his INR is subtherapeutic, pharmacy to follow.   DIABETES MELLITUS, TYPE II -Continue 70/25 and glipizide -Monitor Accu-Cheks and cover with sliding scale insulin   HYPERTENSION -Continue outpatient medications with hold parameters for the hydralazine   CARDIOMYOPATHY, PRIMARY, DILATED/CHF -Compensated, continue outpatient medications   Atrial fibrillation  -Rate controlled, continue beta blockers and anticoagulation with Coumadin as above   SLEEP APNEA -Continue CPAP each bedtime   Chronic renal insufficiency, stage IV (severe) -Stable, follow     Code Status: full Family Communication: wife at bedside Disposition Plan: admit to tele Time spent: >63mins  Kela Millin [facility] Pager [phone]  If 7PM-7AM, please contact night-coverage [email or web address] Password [facility] 04/30/2013, 5:57  PM

## 2013-05-02 NOTE — Progress Notes (Signed)
Discharge instructions given and reviewed with patient. Patient discharged home with family member 

## 2013-05-02 NOTE — Progress Notes (Signed)
Subjective: Positive hallpike maneuver per PT.   Exam: Filed Vitals:   05/02/13 0803  BP: 188/72  Pulse: 76  Temp: 98.1 F (36.7 C)  Resp: 18   Gen: In bed, NAD MS: [place], alert, oriented ZO:XWRUE, EOMI in right eye, no saccades visible.  Motor: 5/5 throughout Cerebellar: no ataxia on FNF or HKS Sensory:intact to LT  Impression: 79 yo M with new onset dysequilibrium/vertigo with movement in the setting of atrial fibrillation. Withou MRI, it is difficult to definitively rule out cerebellar infarct, but in any case treatment would be maintaining a theraputic INR given that other workup reveals LDL 81, ok, a1c 6.8. Echo with low EF. No carotid stenosis.   Recommendations: 1) PT for vestibular therapy.  2) Pharmacy assistance for coumadin dosing, would use coumadin for secondary prophylaxis.  3) No further recommendations at this time, please call with any further questions.    Ritta Slot, MD [facility] [phone]  If 7pm- 7am, please page neurology on call at [phone].

## 2013-05-02 NOTE — Discharge Summary (Signed)
Physician Discharge Summary  [name] [name]. WUJ:[id number] DOB: 1930/09/29 DOA: 04/30/2013  PCP: Judie Petit, MD  Admit date: 04/30/2013 Discharge date: 05/02/2013  Time spent: 30 minutes  Recommendations for Outpatient Follow-up:  1. Recommend outpatient vestibular physical therapy  Discharge Diagnoses:  Principal Problem:   Vertigo Active Problems:   DIABETES MELLITUS, TYPE II   HYPERTENSION   CARDIOMYOPATHY, PRIMARY, DILATED   Atrial fibrillation   SLEEP APNEA   Chronic renal insufficiency, stage IV (severe)   Discharge Condition: Improved  Diet recommendation: Diabetic  Filed Weights   04/30/13 1700  Weight: 90.719 kg (200 lb)    History of present illness:  [name] [name]. is a 79 y.o. male with history of atrial fibrillation status post a pacemaker in the past, CKD STAGE III, DM and multiple medical problems as listed below who presents with above complaints. he states that when he woke up this morning with it felt like the room was spinning and he was unable to keep his balance. He drank some juice thinking that it might be related to hypoglycemia but that did not help. He admits to associated nausea and vomiting. He also states that turning his head from side to side seemed to precipitate the vertigo. He was seen at the [facility] ED and a CT scan of his head was negative. He denies headaches, blurry vision, focal weakness, dysphagia, chest pain, cough, dysuria and no fevers. He is admitted for further evaluation and management.  Hospital Course:  The patient was admitted for further work up of vertigo. The patient was continued on meclizine with symptoms improved by the following day. Neurology had been following with recommendations for vestibular physical therapy, who had seen the patient on 8/10. Recommendations per physical therapy were for outpatient vestibular physical therapy.  Consultations:  Neurology  Discharge Exam: Filed Vitals:   05/02/13  0000 05/02/13 0400 05/02/13 0803 05/02/13 1015  BP: 164/67 174/82 188/72 149/98  Pulse: 75 69 76 51  Temp:   98.1 F (36.7 C) 97.2 F (36.2 C)  TempSrc:    Oral  Resp: 18 18 18 16   Height:   6' (1.829 m)   Weight:      SpO2: 96% 96% 93% 99%    General: Awake, in nad Cardiovascular: regular, s1, s2 Respiratory: normal resp effort  Discharge Instructions       Future Appointments Provider Department Dept Phone   05/19/2013 9:00 AM [facility] Coumad[facility] [phone]   06/14/2013 9:00 AM Lindley Magnus, MD [facility] [place] [phone]   06/16/2013 11:00 AM [facility] Device [facility] [employer] [facility] [place]) [phone]   07/29/2013 9:00 AM [facility]eong Eyvonne [facility] [phone]       Medication List         aspirin 81 MG chewable tablet  Chew 81 mg by mouth daily.     calcitRIOL 0.25 MCG capsule  Commonly known as:  ROCALTROL  Take 0.25 mcg by mouth daily.     digoxin 0.125 MG tablet  Commonly known as:  LANOXIN  Take 0.125 mg by mouth daily.     FREESTYLE TEST STRIPS test strip  Generic drug:  glucose blood  1 each by Other route as needed. Use as instructed     furosemide 80 MG tablet  Commonly known as:  LASIX  Take 80 mg by mouth daily.     glipiZIDE 5 MG tablet  Commonly known as:  GLUCOTROL  Take  0.5 tablets (2.5 mg total) by mouth 2 (two) times daily before a meal.     hydrALAZINE 25 MG tablet  Commonly known as:  APRESOLINE  Take 25 mg by mouth 3 (three) times daily.     insulin lispro protamine-lispro (75-25) 100 UNIT/ML Susp injection  Commonly known as:  HUMALOG 75/25  Inject 45 Units into the skin 2 (two) times daily with a meal.     isosorbide mononitrate 30 MG 24 hr tablet  Commonly known as:  IMDUR  Take 0.5 tablets (15 mg total) by mouth daily.     meclizine 25 MG tablet  Commonly known as:  ANTIVERT  Take 1 tablet (25 mg total) by mouth 3 (three) times daily as needed for  dizziness or nausea.     metoprolol succinate 100 MG 24 hr tablet  Commonly known as:  TOPROL-XL  Take 100 mg by mouth 2 (two) times daily. Take with or immediately following a meal.     nitroGLYCERIN 0.4 MG SL tablet  Commonly known as:  NITROSTAT  Place 0.4 mg under the tongue every 5 (five) minutes x 3 doses as needed for chest pain.     potassium chloride SA 20 MEQ tablet  Commonly known as:  K-DUR,KLOR-CON  Take 20 mEq by mouth 2 (two) times daily.     warfarin 5 MG tablet  Commonly known as:  COUMADIN  Take 2.5-5 mg by mouth daily. Sunday, Monday, Wednesday, Friday, Saturday takes 5mg ; Tuesday, Thursday takes 2.5mg        Allergies  Allergen Reactions  . Prednisone Other (See Comments)    Raised blood sugar   Follow-up Information   Follow up with Judie Petit, MD In 1 week.   Contact information:   [address] [place] [address] [phone]        The results of significant diagnostics from this hospitalization (including imaging, microbiology, ancillary and laboratory) are listed below for reference.    Significant Diagnostic Studies: Dg Chest 2 View  04/30/2013   *RADIOLOGY REPORT*  Clinical Data: Dizziness  CHEST - 2 VIEW  Comparison: 03/26/2012  Findings: Heart is moderately enlarged.  Left subclavian AICD device and leads are stable and intact.  Small bilateral pleural effusions and bibasilar atelectasis.  Upper lungs clear.  Normal vascularity.  No pneumothorax.  IMPRESSION: Small bilateral pleural effusions and bibasilar atelectasis.   Original Report Authenticated By: Jolaine Click, M.D.   Ct Head Wo Contrast  04/30/2013   *RADIOLOGY REPORT*  Clinical Data: Dizziness, vomiting, nausea.  History of colon and prostate carcinoma.  CT HEAD WITHOUT CONTRAST  Technique:  Contiguous axial images were obtained from the base of the skull through the vertex without contrast.  Comparison: 12/27/2009  Findings: Atherosclerotic and physiologic intracranial  calcifications.  Stable punctate calcification in the pons.  Early left basal ganglia mineralization as before. Diffuse parenchymal atrophy. Patchy areas of hypoattenuation in deep and periventricular white matter bilaterally. Negative for acute intracranial hemorrhage, mass lesion, acute infarction, midline shift, or mass-effect. Acute infarct may be inapparent on noncontrast CT. Ventricles and sulci symmetric. Bone windows demonstrate no focal lesion.  IMPRESSION:  1. Negative for bleed or other acute intracranial process.  2. Atrophy and nonspecific white matter changes   Original Report Authenticated By: D. Andria Rhein, MD    Microbiology: No results found for this or any previous visit (from the past 240 hour(s)).   Labs: Basic Metabolic Panel:  Recent Labs Lab 04/30/13 0930 05/01/13 0733 05/02/13 0436  NA 141 143 142  K 4.4 3.8 3.6  CL 102 106 105  CO2 25 25 23   GLUCOSE 193* 141* 174*  BUN 48* 54* 56*  CREATININE 2.80* 3.17* 3.20*  CALCIUM 9.9 9.1 9.1   Liver Function Tests:  Recent Labs Lab 04/30/13 0930 05/02/13 0436  AST 32 26  ALT 18 13  ALKPHOS 36* 32*  BILITOT 0.8 0.7  PROT 6.8 5.8*  ALBUMIN 3.7 3.0*   No results found for this basename: LIPASE, AMYLASE,  in the last 168 hours No results found for this basename: AMMONIA,  in the last 168 hours CBC:  Recent Labs Lab 04/30/13 0930  WBC 6.6  NEUTROABS 4.5  HGB 14.8  HCT 43.9  MCV 94.0  PLT 100*   Cardiac Enzymes:  Recent Labs Lab 04/30/13 0930 04/30/13 1924 04/30/13 2320 05/01/13 0733  TROPONINI <0.30 <0.30 <0.30 <0.30   BNP: BNP (last 3 results) No results found for this basename: PROBNP,  in the last 8760 hours CBG:  Recent Labs Lab 05/01/13 1130 05/01/13 1632 05/01/13 2028 05/01/13 2049 05/02/13 0729  GLUCAP 183* 98 50* 86 141*       Signed:  CHIU, STEPHEN K  [facility] 05/02/2013, 10:48 AM

## 2013-05-03 ENCOUNTER — Telehealth: Payer: Self-pay | Admitting: *Deleted

## 2013-05-03 NOTE — Telephone Encounter (Signed)
Appointment made with dr swords for 8/18

## 2013-05-03 NOTE — Telephone Encounter (Signed)
Transitional care  Admit date:04/30/2013 Discharge date:05/02/2013  Discharge diagnoses: Principal problem:  vertigo Active problems:  diabetes mellitus,type 11  hypertension  cardiomyopathy,primary,dilated  atrial fibrillation  sleep apnea  chronic renal insufficiency,stage 1V (severe)  Discharge condition: improved  Recommendations for outpatient follow-up:   1.recommend outpatient vestibular physical therapy  Talked with patient and states he still has some dizziness,but not as bad as when he went to hospital.  He has not filled his meclizine yet ,but is compliant with his other medication regimen. He is ambulating around the house and his diet is the same as before going to hospital  Patient has a follow-up appointment with dr swords on 05-09-2013 at 8:30am

## 2013-05-04 ENCOUNTER — Telehealth: Payer: Self-pay | Admitting: Internal Medicine

## 2013-05-04 NOTE — Telephone Encounter (Signed)
Angie from [facility] is calling to request orders for out patient PT for dizziness. She states that he was recently seen in the ER, and she believes he'd benefit from being seen there ASAP. She is requesting a call to let her know when the orders are placed, so that she can call the pt immediately. Please assist.

## 2013-05-04 NOTE — Telephone Encounter (Signed)
ok 

## 2013-05-05 ENCOUNTER — Other Ambulatory Visit: Payer: Self-pay | Admitting: *Deleted

## 2013-05-05 DIAGNOSIS — R42 Dizziness and giddiness: Secondary | ICD-10-CM

## 2013-05-05 NOTE — Telephone Encounter (Signed)
ok 

## 2013-05-06 ENCOUNTER — Encounter: Payer: Self-pay | Admitting: Internal Medicine

## 2013-05-09 ENCOUNTER — Ambulatory Visit (INDEPENDENT_AMBULATORY_CARE_PROVIDER_SITE_OTHER): Payer: Medicare Other | Admitting: Internal Medicine

## 2013-05-09 ENCOUNTER — Encounter: Payer: Self-pay | Admitting: Internal Medicine

## 2013-05-09 VITALS — BP 140/75 | HR 72 | Temp 98.1°F | Wt 201.0 lb

## 2013-05-09 DIAGNOSIS — R42 Dizziness and giddiness: Secondary | ICD-10-CM

## 2013-05-09 NOTE — Progress Notes (Signed)
Post hospital f/u Reviewed transition note, d/c summary, h and p, consult notes.  Patient admitted with vertigo- sxs improving with vestibular rehab.   Reviewed CT head  DM- ho sxs of polyuria, polydipsia  CHF- no sxs  Reviewed pmh, psh, sochx, meds  Ros:  patient denies chest pain, shortness of breath, orthopnea. Denies lower extremity edema, abdominal pain, change in appetite, change in bowel movements. Patient denies rashes, musculoskeletal complaints. No other specific complaints in a complete review of systems.   Exam- reviewed vitals  well-developed well-nourished male in no acute distress. HEENT exam atraumatic, normocephalic, neck supple without jugular venous distention. Chest clear to auscultation cardiac exam S1-S2 are irregular. Abdominal exam overweight with bowel sounds, soft and nontender. Extremities no edema. Neurologic exam is alert with a normal gait.   A/p- vertigo- sxs improving Patient scheduled for vestib rehab

## 2013-05-10 ENCOUNTER — Ambulatory Visit: Payer: Medicare Other | Attending: Internal Medicine | Admitting: Rehabilitative and Restorative Service Providers"

## 2013-05-19 ENCOUNTER — Ambulatory Visit (INDEPENDENT_AMBULATORY_CARE_PROVIDER_SITE_OTHER): Payer: Medicare Other | Admitting: General Practice

## 2013-05-19 DIAGNOSIS — I4891 Unspecified atrial fibrillation: Secondary | ICD-10-CM

## 2013-05-19 DIAGNOSIS — Z7901 Long term (current) use of anticoagulants: Secondary | ICD-10-CM

## 2013-05-19 LAB — POCT INR: INR: 3.5

## 2013-05-27 ENCOUNTER — Ambulatory Visit: Payer: Medicare Other | Attending: Internal Medicine | Admitting: Physical Therapy

## 2013-05-27 DIAGNOSIS — R42 Dizziness and giddiness: Secondary | ICD-10-CM | POA: Insufficient documentation

## 2013-05-27 DIAGNOSIS — IMO0001 Reserved for inherently not codable concepts without codable children: Secondary | ICD-10-CM | POA: Insufficient documentation

## 2013-05-27 DIAGNOSIS — H8110 Benign paroxysmal vertigo, unspecified ear: Secondary | ICD-10-CM | POA: Insufficient documentation

## 2013-05-30 ENCOUNTER — Ambulatory Visit: Payer: Medicare Other | Admitting: Physical Therapy

## 2013-05-31 DIAGNOSIS — R42 Dizziness and giddiness: Secondary | ICD-10-CM

## 2013-06-01 ENCOUNTER — Ambulatory Visit: Payer: Medicare Other | Admitting: Physical Therapy

## 2013-06-01 ENCOUNTER — Telehealth: Payer: Self-pay | Admitting: Cardiovascular Disease

## 2013-06-01 NOTE — Telephone Encounter (Signed)
Was in [place] and my blood pressure drop.  Was told to see Dr. Cato Mulligan regarding this.  Dr. Cato Mulligan will not be in the office x 2 weeks.  Still having problems with this.

## 2013-06-01 NOTE — Telephone Encounter (Signed)
Patient states he is being seen by Vestibular Therapy but they have identified potential Orthostatic Hypertension with drops greater than 10 points both diastolically and systolically. Patient states he is having difficulty ambulating due to this and that his imbalance occurs with standing and that he experiences the appearance of the wall spinning if he looks and concentrates on it. Denies swelling of extremities, cough or SOB. Patient prefers to be seen by Dr. Excell Seltzer and Dr. Cato Mulligan is unavailable for the next couple weeks. Routed to Dr. Excell Seltzer.

## 2013-06-03 ENCOUNTER — Ambulatory Visit
Admission: RE | Admit: 2013-06-03 | Discharge: 2013-06-03 | Disposition: A | Discharge status: Home / Self Care | Payer: Medicare Other | Source: Ambulatory Visit | Attending: Internal Medicine | Admitting: Internal Medicine

## 2013-06-03 ENCOUNTER — Encounter: Payer: Self-pay | Admitting: Internal Medicine

## 2013-06-03 ENCOUNTER — Ambulatory Visit (INDEPENDENT_AMBULATORY_CARE_PROVIDER_SITE_OTHER): Payer: Medicare Other | Admitting: Internal Medicine

## 2013-06-03 VITALS — BP 168/90 | HR 68 | Temp 97.9°F | Wt 200.0 lb

## 2013-06-03 DIAGNOSIS — R270 Ataxia, unspecified: Secondary | ICD-10-CM

## 2013-06-03 DIAGNOSIS — H6123 Impacted cerumen, bilateral: Secondary | ICD-10-CM

## 2013-06-03 DIAGNOSIS — R42 Dizziness and giddiness: Secondary | ICD-10-CM

## 2013-06-03 DIAGNOSIS — H6120 Impacted cerumen, unspecified ear: Secondary | ICD-10-CM | POA: Insufficient documentation

## 2013-06-03 DIAGNOSIS — R279 Unspecified lack of coordination: Secondary | ICD-10-CM

## 2013-06-03 DIAGNOSIS — E119 Type 2 diabetes mellitus without complications: Secondary | ICD-10-CM

## 2013-06-03 DIAGNOSIS — I4891 Unspecified atrial fibrillation: Secondary | ICD-10-CM

## 2013-06-03 MED ORDER — MECLIZINE HCL 25 MG PO TABS: 25.0000 mg | ORAL_TABLET | Freq: Three times a day (TID) | ORAL | Status: DC | PRN

## 2013-06-03 NOTE — Assessment & Plan Note (Signed)
Patient has bilateral cerumen impaction. Unclear whether cerumen impaction exacerbating his vertigo. Irrigated both ears and utilized curette to partially remove cerumen plugs.

## 2013-06-03 NOTE — Telephone Encounter (Signed)
Spoke with patient. He is seeing Dr Artist Pais at 2pm. Advised if he needs to see me after that I would be happy to add him on next week. He will call us Monday and let us know.  Tonny Bollman 06/03/2013 1:11 PM

## 2013-06-03 NOTE — Assessment & Plan Note (Signed)
79 year old white male with ataxia and intermittent vertigo. The symptoms different than previous episodes of benign positional vertigo. Rule out cerebellar stroke. Patient on anticoagulation. Rule out cerebellar bleed. Obtain stat CT of head without contrast. Unable to obtain MRI of Brain secondary to history of pacemaker/defibrillator. Patient understands to seek emergency medical attention should his vertigo or ataxia worsen over the weekend.  Refilled antivert.

## 2013-06-03 NOTE — Telephone Encounter (Signed)
Patient called office and call transferred to me; patient states he called in Wednesday with c/o unable to walk a straight line due to drop in blood pressure when he stands and was told that someone would call him back Wednesday or Thursday.  Patient states he was released from vestibular rehab because the therapist believes the patient's BP meds need to be adjusted before proceeding. Patient states no one ever called him and his PCP is out of the office for 2 weeks.  I advised patient that Dr. Excell Seltzer is also out of the office today to which patient replied that when he called Wednesday Dr. Excell Seltzer was in the office, yet no one called him back with Dr. Earmon Phoenix advice.  I apologized that no one called him back and that he is still experiencing symptoms.  I asked the patient if I could work on getting him an appointment and call him back.  Patient verbalized agreement when I promised that I would call back within the hour.  There are no appointments available here at [facility] today so I called [facility] to find out if patient could be seen there today.  Sue Lush was happy to offer the patient an appointment at 2:30 today with Dr. Artist Pais.   I called patient back to tell him that we do not have an openings but that he can be seen at [facility] at 2 pm.  Patient states he is very frustrated because he is having to start "fresh" with a physician that has never seen him.  I listened to patient and apologized again that this was not handled earlier in the week.  I advised patient that Dr. Artist Pais is an associate of Dr. Cato Mulligan and that at either office today the patient would not be able to see his primary physician (Swords or Excell Seltzer) and that since they have an opening and we do not, I felt like this was the best course for him.  Patient stated that he understood that I nor Dr. Artist Pais was responsible for the miscommunication but that he was upset about the way this was handled.  I apologized again and verified the  appointment location and time with the patient who then thanked me for my help.

## 2013-06-03 NOTE — Assessment & Plan Note (Signed)
EKG showed paced rhythm.

## 2013-06-03 NOTE — Telephone Encounter (Signed)
Returning to office.  Was told he would here from [place] on Thurs.   Still having the same problem.

## 2013-06-03 NOTE — Assessment & Plan Note (Signed)
Patient has occasional hypoglycemic episodes. Considering renal insufficiency discontinue glipizide.

## 2013-06-03 NOTE — Progress Notes (Signed)
Subjective:    Patient ID: [name] Frankie., male    DOB: 1930/10/23, 79 y.o.   MRN: [id number]  HPI  79 year old white male with history of dilated cardiomyopathy, atrial fibrillation on anticoagulation, type 2 diabetes, and chronic renal insufficiency complains of unsteady gait and vertigo. Patient reports he had his initial episode of vertigo on August 9 and was evaluated in the emergency room. Patient reports his symptoms improved after having ear wax removed. Since his symptoms recurred last Thursday. He has been refer to vestibular rehabilitation.  However, patient reports his symptoms are somewhat different. Initial episode he felt like he was spinning sideways. Now he has sensation of "vertical spinning".  During previous hospitalization he was evaluated by neurologist at [facility]. MRI could not be obtained due to his history of pacemaker/defibrillator.  Review of Systems He denies headache, no abnormal bleeding.      Past Medical History  Diagnosis Date  . Atrial fibrillation   . CARDIOMYOPATHY, PRIMARY, DILATED   . COLON CANCER, HX OF   . CONGESTIVE HEART FAILURE   . CORONARY ARTERY DISEASE   . PROSTATE CANCER, HX OF   . RENAL DISEASE, CHRONIC, STAGE III   . SKIN CANCER, HX OF   . SLEEP APNEA   . Hypertension   . Hyperlipidemia   . Diabetes mellitus   . Hyperkalemia   . Arthritis   . Osteoarthritis   . Tubular adenoma of colon   . Secondary hyperparathyroidism   . Automatic implantable cardioverter-defibrillator in situ   . Shortness of breath   . Pacemaker     History   Social History  . Marital Status: Married    Spouse Name: N/A    Number of Children: 4  . Years of Education: N/A   Occupational History  . retired    Social History Main Topics  . Smoking status: Former Smoker    Types: Cigarettes    Quit date: 09/22/1966  . Smokeless tobacco: Never Used  . Alcohol Use: 0 - .5 oz/week    0-1 drink(s) per week     Comment: occasional  . Drug  Use: No  . Sexual Activity: No   Other Topics Concern  . Not on file   Social History Narrative  . No narrative on file    Past Surgical History  Procedure Laterality Date  . Colectomy    . Prostatectomy    . Penile prosthesis placement    . Removed prosthetic eye    . Ptca      stent placed  . Total knee arthroplasty  2008    right  . St jude unify      St. Jude single chamber defibrillator 07/19/09 (Dr. Graciela Husbands)  . Pacemaker insertion  2010  . Av fistula placement  03/30/2012    Procedure: ARTERIOVENOUS (AV) FISTULA CREATION;  Surgeon: Chuck Hint, MD;  Location: [facility] OR;  Service: Vascular;  Laterality: Right;  Creation of BrachioCephalic Fistula Right arm  . Cardiac catheterization    . Coronary angioplasty    . Eye surgery      left eye prothesis  . Fracture surgery      collar bone, right knee replacement  . Insert / replace / remove pacemaker      Family History  Problem Relation Age of Onset  . Heart disease Father   . Throat cancer Father   . Throat cancer Mother   . Heart disease Mother   . Hypertension Mother  Allergies  Allergen Reactions  . Prednisone Other (See Comments)    Raised blood sugar    Current Outpatient Prescriptions on File Prior to Visit  Medication Sig Dispense Refill  . aspirin 81 MG chewable tablet Chew 81 mg by mouth daily.      . calcitRIOL (ROCALTROL) 0.25 MCG capsule Take 0.25 mcg by mouth daily.       . digoxin (LANOXIN) 0.125 MG tablet Take 0.125 mg by mouth daily.      . furosemide (LASIX) 80 MG tablet Take 80 mg by mouth daily.      [name] glipiZIDE (GLUCOTROL) 5 MG tablet Take 0.5 tablets (2.5 mg total) by mouth 2 (two) times daily before a meal.  180 tablet  3  . glucose blood (FREESTYLE TEST STRIPS) test strip 1 each by Other route as needed. Use as instructed       . hydrALAZINE (APRESOLINE) 25 MG tablet Take 25 mg by mouth 3 (three) times daily.      . insulin lispro protamine-lispro (HUMALOG 75/25) (75-25) 100  UNIT/ML SUSP injection Inject 45 Units into the skin 2 (two) times daily with a meal.      . isosorbide mononitrate (IMDUR) 30 MG 24 hr tablet Take 0.5 tablets (15 mg total) by mouth daily.  90 tablet  3  . meclizine (ANTIVERT) 25 MG tablet Take 1 tablet (25 mg total) by mouth 3 (three) times daily as needed for dizziness or nausea.  30 tablet  0  . metoprolol succinate (TOPROL-XL) 100 MG 24 hr tablet Take 100 mg by mouth 2 (two) times daily. Take with or immediately following a meal.      . nitroGLYCERIN (NITROSTAT) 0.4 MG SL tablet Place 0.4 mg under the tongue every 5 (five) minutes x 3 doses as needed for chest pain.      . potassium chloride SA (K-DUR,KLOR-CON) 20 MEQ tablet Take 20 mEq by mouth 2 (two) times daily.      [name] warfarin (COUMADIN) 5 MG tablet Take 2.5-5 mg by mouth daily. Sunday, Monday, Wednesday, Friday, Saturday takes 5mg ; Tuesday, Thursday takes 2.5mg        No current facility-administered medications on file prior to visit.    BP 168/90  Pulse 68  Temp(Src) 97.9 F (36.6 C) (Oral)  Wt 200 lb (90.719 kg)  BMI 27.12 kg/m2  SpO2 97%    Objective:   Physical Exam  Constitutional: He is oriented to person, place, and time. He appears well-developed and well-nourished. No distress.  HENT:  Head: Normocephalic and atraumatic.  Mouth/Throat: Oropharynx is clear and moist.  Bilateral cerumen (left worse than right)  Eyes:  Prosthetic left eye.  Right eye - PERRL, EOM  Neck: Neck supple.  No carotid bruit  Cardiovascular: Normal rate and normal heart sounds.  Exam reveals no friction rub.   No murmur heard. Irregularly irregular  Pulmonary/Chest: Breath sounds normal. He has no wheezes.  Abdominal: Soft. Bowel sounds are normal.  Neurological: He is alert and oriented to person, place, and time. No cranial nerve deficit.  Unsteady gait - leans to left side No pronator drift, negative cerebellar signs, no dysmetria  Skin: Skin is warm and dry.  Psychiatric: He has  a normal mood and affect. His behavior is normal.          Assessment & Plan:

## 2013-06-06 ENCOUNTER — Ambulatory Visit: Payer: Medicare Other | Admitting: Physical Therapy

## 2013-06-06 ENCOUNTER — Telehealth: Payer: Self-pay | Admitting: Internal Medicine

## 2013-06-06 NOTE — Telephone Encounter (Signed)
Patient called to let us know that he saw Dr. Artist Pais Friday and wanted to let us know, especially Dr. Excell Seltzer since Dr. Excell Seltzer was nice enough to call him Friday to check on him that Dr. Artist Pais feels that his problem is not BP related.  Patient reports that he has follow up with Dr. Artist Pais Tuesday.  I advised patient to call our office if he has any additional questions or concerns.  Patient verbalized understanding and gratitude

## 2013-06-06 NOTE — Telephone Encounter (Signed)
[address] [place], [place] [address] p. [phone] f. [phone] To: [facility] (After Hours Triage) Fax: [phone] From: [facility] Date/ Time: 06/03/2013 7:12 PM Taken By: Achille Rich, CSR Caller: Rocky Link Facility: [facility] Patient: [name] [name]., [name] DOB: [birth year] Phone: [phone] Reason for Call: Rocky Link is calling with results for CT Scan without contrast ordered by Dr.yoo. The results are Negative and were read by Dr.Dover.

## 2013-06-06 NOTE — Telephone Encounter (Signed)
New Problem  Pt was speaking about BP problems and was advised to call back and speak w/ Nurse (lauren)

## 2013-06-07 ENCOUNTER — Encounter: Payer: Self-pay | Admitting: Internal Medicine

## 2013-06-07 ENCOUNTER — Ambulatory Visit (INDEPENDENT_AMBULATORY_CARE_PROVIDER_SITE_OTHER): Payer: Medicare Other | Admitting: Internal Medicine

## 2013-06-07 VITALS — BP 168/90 | HR 80 | Temp 97.7°F | Wt 200.0 lb

## 2013-06-07 DIAGNOSIS — R42 Dizziness and giddiness: Secondary | ICD-10-CM

## 2013-06-07 DIAGNOSIS — I10 Essential (primary) hypertension: Secondary | ICD-10-CM

## 2013-06-07 DIAGNOSIS — H8110 Benign paroxysmal vertigo, unspecified ear: Secondary | ICD-10-CM

## 2013-06-07 NOTE — Assessment & Plan Note (Signed)
CT of head without contrast negative for acute findings. I suspect his symptoms are secondary to benign positional vertigo. Continue treatments at vestibular rehab.

## 2013-06-07 NOTE — Patient Instructions (Addendum)
Take extra dose of Hydralazine 25 mg if your systolic blood pressure in greater than 170. Contact our office if your dizziness / vertigo gets worse. Please complete the following lab tests before your next follow up appointment: BMET, A1c - 250.00

## 2013-06-07 NOTE — Progress Notes (Signed)
Subjective:    Patient ID: [name] [name]., male    DOB: 08-05-1931, 79 y.o.   MRN: [id number]  HPI  79 year old white male with history of dilated cardiomyopathy, atrial fibrillation and vertigo for followup. Patient completed CT of head without contrast. He was negative for any acute findings. Patient reports his dizziness has somewhat improved. His symptoms are most noticeable first thing in the morning. His symptoms triggered by head movements. No ataxia but he feels unsteady on his feet.  He did not use meclizine. It has not helped in the past for his vertigo.  Review of Systems Negative for nausea and vomiting  Past Medical History  Diagnosis Date  . Atrial fibrillation   . CARDIOMYOPATHY, PRIMARY, DILATED   . COLON CANCER, HX OF   . CONGESTIVE HEART FAILURE   . CORONARY ARTERY DISEASE   . PROSTATE CANCER, HX OF   . RENAL DISEASE, CHRONIC, STAGE III   . SKIN CANCER, HX OF   . SLEEP APNEA   . Hypertension   . Hyperlipidemia   . Diabetes mellitus   . Hyperkalemia   . Arthritis   . Osteoarthritis   . Tubular adenoma of colon   . Secondary hyperparathyroidism   . Automatic implantable cardioverter-defibrillator in situ   . Shortness of breath   . Pacemaker     History   Social History  . Marital Status: Married    Spouse Name: N/A    Number of Children: 4  . Years of Education: N/A   Occupational History  . retired    Social History Main Topics  . Smoking status: Former Smoker    Types: Cigarettes    Quit date: 09/22/1966  . Smokeless tobacco: Never Used  . Alcohol Use: 0 - .5 oz/week    0-1 drink(s) per week     Comment: occasional  . Drug Use: No  . Sexual Activity: No   Other Topics Concern  . Not on file   Social History Narrative  . No narrative on file    Past Surgical History  Procedure Laterality Date  . Colectomy    . Prostatectomy    . Penile prosthesis placement    . Removed prosthetic eye    . Ptca      stent placed  . Total  knee arthroplasty  2008    right  . St jude unify      St. Jude single chamber defibrillator 07/19/09 (Dr. Graciela Husbands)  . Pacemaker insertion  2010  . Av fistula placement  03/30/2012    Procedure: ARTERIOVENOUS (AV) FISTULA CREATION;  Surgeon: Chuck Hint, MD;  Location: [facility] OR;  Service: Vascular;  Laterality: Right;  Creation of BrachioCephalic Fistula Right arm  . Cardiac catheterization    . Coronary angioplasty    . Eye surgery      left eye prothesis  . Fracture surgery      collar bone, right knee replacement  . Insert / replace / remove pacemaker      Family History  Problem Relation Age of Onset  . Heart disease Father   . Throat cancer Father   . Throat cancer Mother   . Heart disease Mother   . Hypertension Mother     Allergies  Allergen Reactions  . Prednisone Other (See Comments)    Raised blood sugar    Current Outpatient Prescriptions on File Prior to Visit  Medication Sig Dispense Refill  . aspirin 81 MG chewable tablet Chew 81  mg by mouth daily.      . calcitRIOL (ROCALTROL) 0.25 MCG capsule Take 0.25 mcg by mouth daily.       . digoxin (LANOXIN) 0.125 MG tablet Take 0.125 mg by mouth daily.      . furosemide (LASIX) 80 MG tablet Take 80 mg by mouth daily.      [name] glucose blood (FREESTYLE TEST STRIPS) test strip 1 each by Other route as needed. Use as instructed       . hydrALAZINE (APRESOLINE) 25 MG tablet Take 25 mg by mouth 3 (three) times daily.      . insulin lispro protamine-lispro (HUMALOG 75/25) (75-25) 100 UNIT/ML SUSP injection Inject 45 Units into the skin 2 (two) times daily with a meal.      . isosorbide mononitrate (IMDUR) 30 MG 24 hr tablet Take 0.5 tablets (15 mg total) by mouth daily.  90 tablet  3  . meclizine (ANTIVERT) 25 MG tablet Take 1 tablet (25 mg total) by mouth 3 (three) times daily as needed for dizziness or nausea.  30 tablet  0  . metoprolol succinate (TOPROL-XL) 100 MG 24 hr tablet Take 100 mg by mouth 2 (two) times daily.  Take with or immediately following a meal.      . nitroGLYCERIN (NITROSTAT) 0.4 MG SL tablet Place 0.4 mg under the tongue every 5 (five) minutes x 3 doses as needed for chest pain.      . potassium chloride SA (K-DUR,KLOR-CON) 20 MEQ tablet Take 20 mEq by mouth 2 (two) times daily.      [name] warfarin (COUMADIN) 5 MG tablet Take 2.5-5 mg by mouth daily. Sunday, Monday, Wednesday, Friday, Saturday takes 5mg ; Tuesday, Thursday takes 2.5mg        No current facility-administered medications on file prior to visit.    BP 168/90  Pulse 80  Temp(Src) 97.7 F (36.5 C) (Oral)  Wt 200 lb (90.719 kg)  BMI 27.12 kg/m2       Objective:   Physical Exam  Constitutional: He appears well-developed and well-nourished.  HENT:  Head: Normocephalic and atraumatic.  Right Ear: External ear normal.  Left cerumen impaction  Cardiovascular: Normal rate.   Irregularly irregular  Pulmonary/Chest: Effort normal. He has no wheezes.  Skin: Skin is warm and dry.  Psychiatric: He has a normal mood and affect. His behavior is normal.          Assessment & Plan:

## 2013-06-07 NOTE — Assessment & Plan Note (Signed)
Blood pressures sporadically elevated today. I would like to avoid over aggressive treatment of his hypertension. If systolic blood pressure greater than 170, patient advised to take extra dose of hydralazine. BP: 168/90 mmHg

## 2013-06-08 ENCOUNTER — Ambulatory Visit: Payer: Medicare Other | Admitting: Physical Therapy

## 2013-06-13 ENCOUNTER — Other Ambulatory Visit: Payer: Self-pay | Admitting: Internal Medicine

## 2013-06-13 ENCOUNTER — Ambulatory Visit: Payer: Medicare Other | Admitting: Physical Therapy

## 2013-06-13 DIAGNOSIS — R42 Dizziness and giddiness: Secondary | ICD-10-CM

## 2013-06-14 ENCOUNTER — Ambulatory Visit: Payer: Medicare Other | Admitting: Internal Medicine

## 2013-06-15 ENCOUNTER — Ambulatory Visit: Payer: Medicare Other | Admitting: Physical Therapy

## 2013-06-16 ENCOUNTER — Ambulatory Visit (INDEPENDENT_AMBULATORY_CARE_PROVIDER_SITE_OTHER): Payer: Medicare Other | Admitting: General Practice

## 2013-06-16 ENCOUNTER — Ambulatory Visit (INDEPENDENT_AMBULATORY_CARE_PROVIDER_SITE_OTHER): Payer: Medicare Other | Admitting: *Deleted

## 2013-06-16 DIAGNOSIS — I4891 Unspecified atrial fibrillation: Secondary | ICD-10-CM

## 2013-06-16 DIAGNOSIS — I509 Heart failure, unspecified: Secondary | ICD-10-CM

## 2013-06-16 DIAGNOSIS — I428 Other cardiomyopathies: Secondary | ICD-10-CM

## 2013-06-16 LAB — ICD DEVICE OBSERVATION
ATRIAL PACING ICD: 0 pct
DEVICE MODEL ICD: 595859
FVT: 0
LV LEAD THRESHOLD: 0.375 V
MODE SWITCH EPISODES: 0
PACEART VT: 0
TOT-0007: 0
TZAT-0012SLOWVT: 200 ms
TZAT-0013SLOWVT: 3
TZAT-0018SLOWVT: NEGATIVE
TZAT-0020SLOWVT: 1 ms
TZON-0003SLOWVT: 300 ms
TZON-0005SLOWVT: 6
TZST-0001SLOWVT: 2
TZST-0001SLOWVT: 5
TZST-0003SLOWVT: 40 J
VENTRICULAR PACING ICD: 65 pct

## 2013-06-16 LAB — POCT INR: INR: 3.1

## 2013-06-16 NOTE — Progress Notes (Signed)
CRT-D device check in office. Thresholds and sensing consistent with previous device measurements. Lead impedance trends stable over time. 100% A-fib, + coumadin.   No ventricular arrhythmia episodes recorded. Patient bi-ventricularly pacing >65% of the time. Device programmed with appropriate safety margins. Heart failure diagnostics reviewed and trends are stable for patient. Audible/vibratory alerts demonstrated for patient. No changes made this session. Estimated longevity 4.2 years.   Patient education completed including shock plan.  ROV in December with Dr. Graciela Husbands.

## 2013-06-17 ENCOUNTER — Ambulatory Visit: Payer: Medicare Other | Admitting: Rehabilitative and Restorative Service Providers"

## 2013-06-17 ENCOUNTER — Other Ambulatory Visit: Payer: Self-pay | Admitting: Internal Medicine

## 2013-06-20 ENCOUNTER — Other Ambulatory Visit: Payer: Self-pay | Admitting: Internal Medicine

## 2013-06-20 ENCOUNTER — Ambulatory Visit: Payer: Medicare Other | Admitting: Physical Therapy

## 2013-06-22 ENCOUNTER — Ambulatory Visit: Payer: Medicare Other | Admitting: Physical Therapy

## 2013-06-24 ENCOUNTER — Ambulatory Visit: Payer: Medicare Other | Attending: Internal Medicine | Admitting: Physical Therapy

## 2013-06-24 DIAGNOSIS — R42 Dizziness and giddiness: Secondary | ICD-10-CM | POA: Insufficient documentation

## 2013-06-24 DIAGNOSIS — H8110 Benign paroxysmal vertigo, unspecified ear: Secondary | ICD-10-CM | POA: Insufficient documentation

## 2013-06-24 DIAGNOSIS — IMO0001 Reserved for inherently not codable concepts without codable children: Secondary | ICD-10-CM | POA: Insufficient documentation

## 2013-06-27 ENCOUNTER — Encounter: Payer: Medicare Other | Admitting: Rehabilitative and Restorative Service Providers"

## 2013-06-30 ENCOUNTER — Encounter: Payer: Self-pay | Admitting: Internal Medicine

## 2013-06-30 ENCOUNTER — Encounter: Payer: Medicare Other | Admitting: Physical Therapy

## 2013-07-04 ENCOUNTER — Encounter: Payer: Medicare Other | Admitting: Rehabilitative and Restorative Service Providers"

## 2013-07-07 ENCOUNTER — Encounter: Payer: Medicare Other | Admitting: Physical Therapy

## 2013-07-14 ENCOUNTER — Ambulatory Visit (INDEPENDENT_AMBULATORY_CARE_PROVIDER_SITE_OTHER): Payer: Medicare Other | Admitting: Family

## 2013-07-14 ENCOUNTER — Ambulatory Visit: Payer: Medicare Other

## 2013-07-14 DIAGNOSIS — Z7901 Long term (current) use of anticoagulants: Secondary | ICD-10-CM

## 2013-07-14 DIAGNOSIS — I4891 Unspecified atrial fibrillation: Secondary | ICD-10-CM

## 2013-07-14 LAB — POCT INR: INR: 2.4

## 2013-07-14 NOTE — Patient Instructions (Signed)
Continue current dose.  Re-check in 4 weeks.  Anticoagulation Dose Instructions as of 07/14/2013     [name] [name] Tue Wed Thu Fri Sat   New Dose 5 mg 2.5 mg 5 mg 2.5 mg 5 mg 2.5 mg 5 mg    Description       Continue current dose.  Re-check in 4 weeks.

## 2013-07-29 ENCOUNTER — Encounter: Payer: Self-pay | Admitting: Podiatry

## 2013-07-29 ENCOUNTER — Ambulatory Visit (INDEPENDENT_AMBULATORY_CARE_PROVIDER_SITE_OTHER): Payer: Medicare Other | Admitting: Podiatry

## 2013-07-29 VITALS — Ht 71.0 in | Wt 200.0 lb

## 2013-07-29 DIAGNOSIS — B351 Tinea unguium: Secondary | ICD-10-CM

## 2013-07-29 DIAGNOSIS — M79671 Pain in right foot: Secondary | ICD-10-CM

## 2013-07-29 DIAGNOSIS — M79609 Pain in unspecified limb: Secondary | ICD-10-CM

## 2013-07-29 NOTE — Patient Instructions (Signed)
Seen for mycotic ingrown nails. All nails debrided. Return in 3 months or as needed.

## 2013-07-29 NOTE — Progress Notes (Signed)
Subjective:  79 y.o. year old male patient presents requesting toe nails trimmed.   Objective: no significant changes since last visit  Dermatologic:  Thick dystrophic nails x 9 with missing 4th left.  Cryptotic nails both great toes.  Skin: No skin lesions.  Vascular:  Dorsalis pedis pulses palpable on both feet.  Posterior tibial pulses are not palpable on both feet.  No edema or erythema, no ischemic changes noted.  Orthopedic:  Cavus type foot.  Neurologic:  Failed respond to Monofilament sensory testing.  Vibratory and Ankle DTR is within normal bilateral.  Assessment:  Dystrophic mycotic nails x 10.  Diabetic neuropathy.   Treatment: All mycotic nails debrided.  Return in 3 months or as needed.

## 2013-08-08 ENCOUNTER — Ambulatory Visit (INDEPENDENT_AMBULATORY_CARE_PROVIDER_SITE_OTHER): Payer: Medicare Other | Admitting: Internal Medicine

## 2013-08-08 ENCOUNTER — Ambulatory Visit (HOSPITAL_COMMUNITY)
Admission: RE | Admit: 2013-08-08 | Discharge: 2013-08-08 | Disposition: A | Discharge status: Home / Self Care | Payer: Medicare Other | Source: Ambulatory Visit | Attending: Internal Medicine | Admitting: Internal Medicine

## 2013-08-08 ENCOUNTER — Encounter: Payer: Self-pay | Admitting: Internal Medicine

## 2013-08-08 ENCOUNTER — Ambulatory Visit (INDEPENDENT_AMBULATORY_CARE_PROVIDER_SITE_OTHER): Payer: Medicare Other | Admitting: General Practice

## 2013-08-08 VITALS — BP 156/100 | HR 72 | Temp 97.7°F | Wt 196.0 lb

## 2013-08-08 DIAGNOSIS — R630 Anorexia: Secondary | ICD-10-CM

## 2013-08-08 DIAGNOSIS — N184 Chronic kidney disease, stage 4 (severe): Secondary | ICD-10-CM

## 2013-08-08 DIAGNOSIS — R161 Splenomegaly, not elsewhere classified: Secondary | ICD-10-CM | POA: Insufficient documentation

## 2013-08-08 DIAGNOSIS — R6881 Early satiety: Secondary | ICD-10-CM

## 2013-08-08 DIAGNOSIS — I4891 Unspecified atrial fibrillation: Secondary | ICD-10-CM

## 2013-08-08 DIAGNOSIS — N289 Disorder of kidney and ureter, unspecified: Secondary | ICD-10-CM | POA: Insufficient documentation

## 2013-08-08 DIAGNOSIS — K828 Other specified diseases of gallbladder: Secondary | ICD-10-CM | POA: Insufficient documentation

## 2013-08-08 DIAGNOSIS — Z85038 Personal history of other malignant neoplasm of large intestine: Secondary | ICD-10-CM

## 2013-08-08 DIAGNOSIS — R109 Unspecified abdominal pain: Secondary | ICD-10-CM

## 2013-08-08 DIAGNOSIS — Z7901 Long term (current) use of anticoagulants: Secondary | ICD-10-CM

## 2013-08-08 DIAGNOSIS — E119 Type 2 diabetes mellitus without complications: Secondary | ICD-10-CM

## 2013-08-08 LAB — CBC WITH DIFFERENTIAL/PLATELET
Basophils Absolute: 0 10*3/uL (ref 0.0–0.1)
Hemoglobin: 15.2 g/dL (ref 13.0–17.0)
Lymphocytes Relative: 23 % (ref 12.0–46.0)
MCHC: 33.6 g/dL (ref 30.0–36.0)
Monocytes Relative: 6.1 % (ref 3.0–12.0)
Neutro Abs: 6.2 10*3/uL (ref 1.4–7.7)
Neutrophils Relative %: 68.8 % (ref 43.0–77.0)
Platelets: 139 10*3/uL — ABNORMAL LOW (ref 150.0–400.0)
RDW: 15.3 % — ABNORMAL HIGH (ref 11.5–14.6)
WBC: 9.1 10*3/uL (ref 4.5–10.5)

## 2013-08-08 LAB — BASIC METABOLIC PANEL
CO2: 27 mEq/L (ref 19–32)
Calcium: 10.6 mg/dL — ABNORMAL HIGH (ref 8.4–10.5)
Creatinine, Ser: 3.4 mg/dL — ABNORMAL HIGH (ref 0.4–1.5)
GFR: 18.91 mL/min — ABNORMAL LOW (ref >60.00)
Glucose, Bld: 197 mg/dL — ABNORMAL HIGH (ref 70–99)
Sodium: 141 mEq/L (ref 135–145)

## 2013-08-08 LAB — HEPATIC FUNCTION PANEL
AST: 23 U/L (ref 0–37)
Albumin: 4 g/dL (ref 3.5–5.2)
Bilirubin, Direct: 0.2 mg/dL (ref 0.0–0.3)
Total Bilirubin: 1.4 mg/dL — ABNORMAL HIGH (ref 0.3–1.2)

## 2013-08-08 LAB — POCT URINALYSIS DIP (MANUAL ENTRY)
Glucose, UA: NEGATIVE
Leukocytes, UA: NEGATIVE
Nitrite, UA: NEGATIVE
Protein Ur, POC: >=300

## 2013-08-08 LAB — POCT INR: INR: 2.8

## 2013-08-08 NOTE — Patient Instructions (Addendum)
Take  Omeprazole,  prilosec  Type medication once a day.  You will be contacted about  Abdominal ultrasound test and lab results and then plan as above. Uncertain cause of your symptoms at this time.  May need for testing if continues .   Take liquids   So you dont get dehydrated

## 2013-08-08 NOTE — Progress Notes (Signed)
Chief Complaint  Patient presents with  . Abdominal Pain    Ongoing for several days.  . Loss of appetite    HPI: Patient comes in today for SDA for  new problem evaluation. PCP NA  Problems with stomach off an on for a while . Since Lung cancer  Wife dx   Eating on fly   Passing  Sx To see  Dr Kathie Rhodes in December  Pizza 2 night s and eating out Feeling  Throat closes on food.  No change bowels.  bms help but not recently so hasnt eaten since bk fast yesterday.  afriad of liquids  fluids  Felt full.no vomiting weight loss sweats  So didn't take the pm insulin.   So drank a warm coke to get meds down.  ? No cp sob.   ROS: See pertinent positives and negatives per HPI. No ulcers   gb hx  BO   uti sx  No bleeding    Colons cancer.  15 years an no problem.according to him  And ? Need  Past Medical History  Diagnosis Date  . Atrial fibrillation   . CARDIOMYOPATHY, PRIMARY, DILATED   . COLON CANCER, HX OF   . CONGESTIVE HEART FAILURE   . CORONARY ARTERY DISEASE   . PROSTATE CANCER, HX OF   . RENAL DISEASE, CHRONIC, STAGE III   . SKIN CANCER, HX OF   . SLEEP APNEA   . Hypertension   . Hyperlipidemia   . Diabetes mellitus   . Hyperkalemia   . Arthritis   . Osteoarthritis   . Tubular adenoma of colon   . Secondary hyperparathyroidism   . Automatic implantable cardioverter-defibrillator in situ   . Shortness of breath   . Pacemaker     Family History  Problem Relation Age of Onset  . Heart disease Father   . Throat cancer Father   . Throat cancer Mother   . Heart disease Mother   . Hypertension Mother     History   Social History  . Marital Status: Married    Spouse Name: N/A    Number of Children: 4  . Years of Education: N/A   Occupational History  . retired    Social History Main Topics  . Smoking status: Former Smoker    Types: Cigarettes    Quit date: 09/22/1966  . Smokeless tobacco: Never Used  . Alcohol Use: 0 - .5 oz/week    0-1 drink(s) per week   Comment: occasional  . Drug Use: No  . Sexual Activity: No   Other Topics Concern  . None   Social History Narrative  . None    Outpatient Encounter Prescriptions as of 08/08/2013  Medication Sig  . aspirin 81 MG chewable tablet Chew 81 mg by mouth daily.  . calcitRIOL (ROCALTROL) 0.25 MCG capsule Take 0.25 mcg by mouth daily.   . digoxin (LANOXIN) 0.125 MG tablet Take 0.125 mg by mouth daily.  . furosemide (LASIX) 80 MG tablet TAKE 1 TABLET BY MOUTH EVERY DAY  . glucose blood (FREESTYLE TEST STRIPS) test strip 1 each by Other route as needed. Use as instructed   . hydrALAZINE (APRESOLINE) 25 MG tablet Take 25 mg by mouth 3 (three) times daily.  . insulin lispro protamine-lispro (HUMALOG 75/25) (75-25) 100 UNIT/ML SUSP injection Inject 45 Units into the skin 2 (two) times daily with a meal.  . isosorbide mononitrate (IMDUR) 30 MG 24 hr tablet Take 0.5 tablets (15 mg total) by mouth  daily.  . meclizine (ANTIVERT) 25 MG tablet Take 1 tablet (25 mg total) by mouth 3 (three) times daily as needed for dizziness or nausea.  . metoprolol succinate (TOPROL-XL) 100 MG 24 hr tablet Take 100 mg by mouth 2 (two) times daily. Take with or immediately following a meal.  . nitroGLYCERIN (NITROSTAT) 0.4 MG SL tablet Place 0.4 mg under the tongue every 5 (five) minutes x 3 doses as needed for chest pain.  . potassium chloride SA (K-DUR,KLOR-CON) 20 MEQ tablet Take 20 mEq by mouth 2 (two) times daily.  [name] warfarin (COUMADIN) 5 MG tablet Take 2.5-5 mg by mouth daily. Sunday, Monday, Wednesday, Friday, Saturday takes 5mg ; Tuesday, Thursday takes 2.5mg     EXAM:  BP 156/100  Pulse 72  Temp(Src) 97.7 F (36.5 C) (Oral)  Wt 196 lb (88.905 kg)  SpO2 98%  Body mass index is 27.35 kg/(m^2).  GENERAL: vitals reviewed and listed above, alert, oriented, appears well hydrated and in no acute distress HEENT: atraumatic, conjunctiva  Clear g;asses , no obvious abnormalities on inspection of external nose and  ears OP : no lesion edema or exudate   NECK: no obvious masses on inspection palpation  No sig jvd  LUNGS: clear to auscultation bilaterally, no wheezes, rales or rhonchi, good air movement CV: HRIrreg RR, click  no clubbing cyanosis or   nl cap refill  Abdomen:  Sof,t normal bowel sounds without hepatosplenomegaly, no guarding rebound or masses no CVA tenderness points to area mid lower abd as tender but no mass of g or r  MS: moves all extremities without noticeable gross focal  abnormality  PSYCH: pleasant and cooperative, no obvious depression or anxiety Wt Readings from Last 3 Encounters:  08/08/13 196 lb (88.905 kg)  07/29/13 200 lb (90.719 kg)  06/07/13 200 lb (90.719 kg)    ASSESSMENT AND PLAN:  Discussed the following assessment and plan:  Anorexia - Plan: Basic metabolic panel, CBC with Differential, POCT urinalysis dipstick, TSH, Hepatic function panel, Hemoglobin A1c, US Abdomen Complete  Abdominal discomfort - Plan: Basic metabolic panel, CBC with Differential, POCT urinalysis dipstick, TSH, Hepatic function panel, Hemoglobin A1c, US Abdomen Complete  DIABETES MELLITUS, TYPE II - Plan: Basic metabolic panel, CBC with Differential, POCT urinalysis dipstick, TSH, Hepatic function panel, Hemoglobin A1c  Early satiety - Plan: US Abdomen Complete  COLON CANCER, HX OF  Chronic renal insufficiency, stage IV (severe) Exam unrevealing  gegt [place] adn lab and plan fu with pcp and or GI for more evalution  Consider  Ct without contract depending on results .  -Patient advised to return or notify health care team  if symptoms worsen or persist or new concerns arise.  Patient Instructions  Take  Omeprazole,  prilosec  Type medication once a day.  You will be contacted about  Abdominal ultrasound test and lab results and then plan as above. Uncertain cause of your symptoms at this time.  May need for testing if continues .   Take liquids   So you dont get dehydrated    Neta Mends.  Heath Badon M.D.

## 2013-08-11 ENCOUNTER — Ambulatory Visit: Payer: Medicare Other

## 2013-08-11 ENCOUNTER — Telehealth: Payer: Self-pay | Admitting: *Deleted

## 2013-08-11 NOTE — Telephone Encounter (Signed)
Pt states he seen Dr Artist Pais for vertigo and Dr. Artist Pais took him off his glipizide.  He seen Dr. Fabian Sharp and had labs done and now his HgBa1c is 9.6.  Do you want pt to go back on his glipizide?

## 2013-08-11 NOTE — Telephone Encounter (Signed)
Increase his dose of insulin to 50 units bid

## 2013-08-12 NOTE — Telephone Encounter (Signed)
Pt decided to go back on glipizide himself.  He does not want increase to insulin cause then he will have to increase vials and that cost more money.  Pt states he will discuss it with Dr Cato Mulligan on his next office visit

## 2013-08-22 HISTORY — PX: ABLATION: SHX5711

## 2013-08-24 ENCOUNTER — Encounter: Payer: Self-pay | Admitting: Internal Medicine

## 2013-08-29 ENCOUNTER — Encounter: Payer: Self-pay | Admitting: Internal Medicine

## 2013-09-06 ENCOUNTER — Encounter: Payer: Self-pay | Admitting: Internal Medicine

## 2013-09-06 ENCOUNTER — Ambulatory Visit (INDEPENDENT_AMBULATORY_CARE_PROVIDER_SITE_OTHER): Payer: Medicare Other | Admitting: Internal Medicine

## 2013-09-06 ENCOUNTER — Ambulatory Visit (INDEPENDENT_AMBULATORY_CARE_PROVIDER_SITE_OTHER): Payer: Medicare Other | Admitting: *Deleted

## 2013-09-06 VITALS — BP 142/87 | HR 71 | Ht 72.0 in | Wt 200.4 lb

## 2013-09-06 DIAGNOSIS — I255 Ischemic cardiomyopathy: Secondary | ICD-10-CM

## 2013-09-06 DIAGNOSIS — I2589 Other forms of chronic ischemic heart disease: Secondary | ICD-10-CM

## 2013-09-06 DIAGNOSIS — I4891 Unspecified atrial fibrillation: Secondary | ICD-10-CM

## 2013-09-06 DIAGNOSIS — I509 Heart failure, unspecified: Secondary | ICD-10-CM

## 2013-09-06 DIAGNOSIS — Z95810 Presence of automatic (implantable) cardiac defibrillator: Secondary | ICD-10-CM

## 2013-09-06 LAB — MDC_IDC_ENUM_SESS_TYPE_INCLINIC
Battery Remaining Longevity: 48 mo
Brady Statistic RA Percent Paced: 0 %
Brady Statistic RV Percent Paced: 72 %
Date Time Interrogation Session: 20141216173657
Implantable Pulse Generator Serial Number: 595859
Lead Channel Impedance Value: 375 Ohm
Lead Channel Pacing Threshold Amplitude: 0.375 V
Lead Channel Pacing Threshold Pulse Width: 0.6 ms
Lead Channel Pacing Threshold Pulse Width: 1 ms
Lead Channel Sensing Intrinsic Amplitude: 12 mV
Lead Channel Setting Pacing Pulse Width: 1 ms
Lead Channel Setting Sensing Sensitivity: 0.5 mV
Zone Setting Detection Interval: 250 ms
Zone Setting Detection Interval: 300 ms

## 2013-09-06 LAB — POCT INR: INR: 2.6

## 2013-09-06 NOTE — Assessment & Plan Note (Signed)
The patient's device was interrogated.  The information was reviewed. No changes were made in the programming.    

## 2013-09-06 NOTE — Patient Instructions (Signed)
Your physician recommends that you continue on your current medications as directed. Please refer to the Current Medication list given to you today.  Remote monitoring is used to monitor your Pacemaker of ICD from home. This monitoring reduces the number of office visits required to check your device to one time per year. It allows us to keep an eye on the functioning of your device to ensure it is working properly. You are scheduled for a device check from home on 12/08/2013. You may send your transmission at any time that day. If you have a wireless device, the transmission will be sent automatically. After your physician reviews your transmission, you will receive a postcard with your next transmission date.  Your physician wants you to follow-up in: 1 year with Dr. Klein.  You will receive a reminder letter in the mail two months in advance. If you don't receive a letter, please call our office to schedule the follow-up appointment.    

## 2013-09-06 NOTE — Progress Notes (Signed)
Patient Care Team: Lindley Magnus, MD as PCP - General   HPI  [name] [name]. is a 79 y.o. male seen in followup for congestive heart failure in the setting of atrial fibrillation with a rapid ventricular response and associated cardiomyopathy with both ischemic and nonischemic components.  He is status post CRT-D implantation with Anticipation of AV junction ablation. However, we were then able to accomplish rate control And ablation was not undertaken.   Cardiac catheterization in OCT 2010 Nonischemic cardiomyopathy with reduced cardiac output 2. Continued patency of the left anterior descending artery at site of previous stenting 3. Scattered noncritical disease ; left ventricular function out of proportion to the extent of coronary disease.  Echo 2011 demonstrated EF 30-35%   As opposed to previous years he now notes taht he has less exercise tolerance with fatigue;; no edema   His wife is intercurrently diagnosed with stage IV lung cancer. She is doing relatively well   Past Medical History  Diagnosis Date  . Atrial fibrillation   . CARDIOMYOPATHY, PRIMARY, DILATED   . COLON CANCER, HX OF   . CONGESTIVE HEART FAILURE   . CORONARY ARTERY DISEASE   . PROSTATE CANCER, HX OF   . RENAL DISEASE, CHRONIC, STAGE III   . SKIN CANCER, HX OF   . SLEEP APNEA   . Hypertension   . Hyperlipidemia   . Diabetes mellitus   . Hyperkalemia   . Arthritis   . Osteoarthritis   . Tubular adenoma of colon   . Secondary hyperparathyroidism   . Automatic implantable cardioverter-defibrillator in situ   . Shortness of breath   . Pacemaker     Past Surgical History  Procedure Laterality Date  . Colectomy    . Prostatectomy    . Penile prosthesis placement    . Removed prosthetic eye    . Ptca      stent placed  . Total knee arthroplasty  2008    right  . St jude unify      St. Jude single chamber defibrillator 07/19/09 (Dr. Graciela Husbands)  . Pacemaker insertion  2010  . Av  fistula placement  03/30/2012    Procedure: ARTERIOVENOUS (AV) FISTULA CREATION;  Surgeon: Chuck Hint, MD;  Location: [facility] OR;  Service: Vascular;  Laterality: Right;  Creation of BrachioCephalic Fistula Right arm  . Cardiac catheterization    . Coronary angioplasty    . Eye surgery      left eye prothesis  . Fracture surgery      collar bone, right knee replacement  . Insert / replace / remove pacemaker      Current Outpatient Prescriptions  Medication Sig Dispense Refill  . aspirin 81 MG chewable tablet Chew 81 mg by mouth daily.      . calcitRIOL (ROCALTROL) 0.25 MCG capsule Take 0.25 mcg by mouth daily.       . digoxin (LANOXIN) 0.125 MG tablet Take 0.125 mg by mouth daily.      . furosemide (LASIX) 80 MG tablet TAKE 1 TABLET BY MOUTH EVERY DAY  90 tablet  3  . glucose blood (FREESTYLE TEST STRIPS) test strip 1 each by Other route as needed. Use as instructed       . hydrALAZINE (APRESOLINE) 25 MG tablet Take 25 mg by mouth 3 (three) times daily.      . insulin lispro protamine-lispro (HUMALOG 75/25) (75-25) 100 UNIT/ML SUSP injection Inject 45 Units into the skin 2 (  two) times daily with a meal.      . isosorbide mononitrate (IMDUR) 30 MG 24 hr tablet Take 0.5 tablets (15 mg total) by mouth daily.  90 tablet  3  . meclizine (ANTIVERT) 25 MG tablet Take 1 tablet (25 mg total) by mouth 3 (three) times daily as needed for dizziness or nausea.  30 tablet  0  . metoprolol succinate (TOPROL-XL) 100 MG 24 hr tablet Take 100 mg by mouth 2 (two) times daily. Take with or immediately following a meal.      . nitroGLYCERIN (NITROSTAT) 0.4 MG SL tablet Place 0.4 mg under the tongue every 5 (five) minutes x 3 doses as needed for chest pain.      . potassium chloride (KLOR-CON) 20 MEQ packet Take by mouth 2 (two) times daily.      . potassium chloride SA (K-DUR,KLOR-CON) 20 MEQ tablet Take 20 mEq by mouth 2 (two) times daily.      [name] warfarin (COUMADIN) 5 MG tablet Take 2.5-5 mg by mouth  daily. Monday, Wednesday, Friday, takes 2.5 mg; Tuesday, Thursday, Saturday, Sunday takes 5 mg       No current facility-administered medications for this visit.    Allergies  Allergen Reactions  . Prednisone Other (See Comments)    Raised blood sugar    Review of Systems negative except from HPI and PMH  Physical Exam BP 142/87  Pulse 71  Ht 6' (1.829 m)  Wt 200 lb 6.4 oz (90.901 kg)  BMI 27.17 kg/m2 Well developed and nourished in no acute distress HENT normal Neck supple with JVP-flat Carotids brisk and full without bruits Clear Irregularly irregular rate and rhythm with controlled entricular response, no murmurs or gallops Abd-soft with active BS without hepatomegaly No Clubbing cyanosis edema Skin-warm and dry A & Oriented  Grossly normal sensory and motor function     Assessment and  Plan

## 2013-09-06 NOTE — Assessment & Plan Note (Signed)
Pt with increasing heart failure symptoms with LV dysfunction and permanent atrial fib with only 72% Vpacing begs the issue of whether AV ablation makes sense.  Now there are data to suggest not only improved morbidity as well as mortality with incremental increases in vpacing from afib from 90>>95>>98%   He would like to review this with Dr Excell Seltzer

## 2013-09-08 ENCOUNTER — Encounter: Payer: Medicare Other | Admitting: Internal Medicine

## 2013-09-09 ENCOUNTER — Encounter: Payer: Self-pay | Admitting: Internal Medicine

## 2013-09-09 ENCOUNTER — Ambulatory Visit (INDEPENDENT_AMBULATORY_CARE_PROVIDER_SITE_OTHER): Payer: Medicare Other | Admitting: Internal Medicine

## 2013-09-09 VITALS — BP 142/80 | Temp 97.9°F | Ht 72.0 in | Wt 201.0 lb

## 2013-09-09 DIAGNOSIS — I509 Heart failure, unspecified: Secondary | ICD-10-CM

## 2013-09-09 DIAGNOSIS — N184 Chronic kidney disease, stage 4 (severe): Secondary | ICD-10-CM

## 2013-09-09 DIAGNOSIS — Z4502 Encounter for adjustment and management of automatic implantable cardiac defibrillator: Secondary | ICD-10-CM

## 2013-09-09 DIAGNOSIS — E119 Type 2 diabetes mellitus without complications: Secondary | ICD-10-CM

## 2013-09-09 DIAGNOSIS — Z23 Encounter for immunization: Secondary | ICD-10-CM

## 2013-09-09 NOTE — Progress Notes (Signed)
Pre visit review using our clinic review tool, if applicable. No additional management support is needed unless otherwise documented below in the visit note. 

## 2013-09-09 NOTE — Progress Notes (Signed)
CHF- reviewed with Dr. Odessa Fleming note. Considering av ablation.   DM- tolerating meds. Note last a1c  Mood- struggling with wife's terminal lung cancer  Anticoagulation- no bleeding ocmplications  Past Medical History  Diagnosis Date  . Atrial fibrillation   . CARDIOMYOPATHY, PRIMARY, DILATED   . COLON CANCER, HX OF   . CONGESTIVE HEART FAILURE   . CORONARY ARTERY DISEASE   . PROSTATE CANCER, HX OF   . RENAL DISEASE, CHRONIC, STAGE III   . SKIN CANCER, HX OF   . SLEEP APNEA   . Hypertension   . Hyperlipidemia   . Diabetes mellitus   . Hyperkalemia   . Arthritis   . Osteoarthritis   . Tubular adenoma of colon   . Secondary hyperparathyroidism   . Automatic implantable cardioverter-defibrillator in situ   . Shortness of breath   . Pacemaker     History   Social History  . Marital Status: Married    Spouse Name: N/A    Number of Children: 4  . Years of Education: N/A   Occupational History  . retired    Social History Main Topics  . Smoking status: Former Smoker    Types: Cigarettes    Quit date: 09/22/1966  . Smokeless tobacco: Never Used  . Alcohol Use: 0 - .5 oz/week    0-1 drink(s) per week     Comment: occasional  . Drug Use: No  . Sexual Activity: No   Other Topics Concern  . Not on file   Social History Narrative  . No narrative on file    Past Surgical History  Procedure Laterality Date  . Colectomy    . Prostatectomy    . Penile prosthesis placement    . Removed prosthetic eye    . Ptca      stent placed  . Total knee arthroplasty  2008    right  . St jude unify      St. Jude single chamber defibrillator 07/19/09 (Dr. Graciela Husbands)  . Pacemaker insertion  2010  . Av fistula placement  03/30/2012    Procedure: ARTERIOVENOUS (AV) FISTULA CREATION;  Surgeon: Chuck Hint, MD;  Location: [facility] OR;  Service: Vascular;  Laterality: Right;  Creation of BrachioCephalic Fistula Right arm  . Cardiac catheterization    . Coronary angioplasty    .  Eye surgery      left eye prothesis  . Fracture surgery      collar bone, right knee replacement  . Insert / replace / remove pacemaker      Family History  Problem Relation Age of Onset  . Heart disease Father   . Throat cancer Father   . Throat cancer Mother   . Heart disease Mother   . Hypertension Mother     Allergies  Allergen Reactions  . Prednisone Other (See Comments)    Raised blood sugar    Current Outpatient Prescriptions on File Prior to Visit  Medication Sig Dispense Refill  . aspirin 81 MG chewable tablet Chew 81 mg by mouth daily.      . digoxin (LANOXIN) 0.125 MG tablet Take 0.125 mg by mouth daily.      . furosemide (LASIX) 80 MG tablet TAKE 1 TABLET BY MOUTH EVERY DAY  90 tablet  3  . glucose blood (FREESTYLE TEST STRIPS) test strip 1 each by Other route as needed. Use as instructed       . hydrALAZINE (APRESOLINE) 25 MG tablet Take 25 mg by mouth  3 (three) times daily.      . insulin lispro protamine-lispro (HUMALOG 75/25) (75-25) 100 UNIT/ML SUSP injection Inject 45 Units into the skin 2 (two) times daily with a meal.      . isosorbide mononitrate (IMDUR) 30 MG 24 hr tablet Take 0.5 tablets (15 mg total) by mouth daily.  90 tablet  3  . meclizine (ANTIVERT) 25 MG tablet Take 1 tablet (25 mg total) by mouth 3 (three) times daily as needed for dizziness or nausea.  30 tablet  0  . metoprolol succinate (TOPROL-XL) 100 MG 24 hr tablet Take 100 mg by mouth 2 (two) times daily. Take with or immediately following a meal.      . nitroGLYCERIN (NITROSTAT) 0.4 MG SL tablet Place 0.4 mg under the tongue every 5 (five) minutes x 3 doses as needed for chest pain.      . potassium chloride SA (K-DUR,KLOR-CON) 20 MEQ tablet Take 20 mEq by mouth daily.       [name] warfarin (COUMADIN) 5 MG tablet Take 2.5-5 mg by mouth daily. Monday, Wednesday, Friday, takes 2.5 mg; Tuesday, Thursday, Saturday, Sunday takes 5 mg       No current facility-administered medications on file prior to  visit.     patient denies chest pain, shortness of breath, orthopnea. Denies lower extremity edema, abdominal pain, change in appetite, change in bowel movements. Patient denies rashes, musculoskeletal complaints. No other specific complaints in a complete review of systems.   BP 168/88  Temp(Src) 97.9 F (36.6 C) (Oral)  Ht 6' (1.829 m)  Wt 201 lb (91.173 kg)  BMI 27.25 kg/m2  well-developed well-nourished male in no acute distress. HEENT exam atraumatic, normocephalic, neck supple without jugular venous distention. Chest clear to auscultation cardiac exam S1-S2 are regular. Abdominal exam overweight with bowel sounds, soft and nontender. Extremities no edema. Neurologic exam is alert with a normal gait.   Flu immunization today

## 2013-09-10 ENCOUNTER — Inpatient Hospital Stay (HOSPITAL_BASED_OUTPATIENT_CLINIC_OR_DEPARTMENT_OTHER)
Admission: EM | Admit: 2013-09-10 | Discharge: 2013-09-13 | DRG: 251 | Disposition: A | Discharge status: Home / Self Care | Payer: Medicare Other | Attending: Internal Medicine | Admitting: Internal Medicine

## 2013-09-10 ENCOUNTER — Emergency Department (HOSPITAL_BASED_OUTPATIENT_CLINIC_OR_DEPARTMENT_OTHER): Payer: Medicare Other

## 2013-09-10 ENCOUNTER — Encounter (HOSPITAL_BASED_OUTPATIENT_CLINIC_OR_DEPARTMENT_OTHER): Payer: Self-pay | Admitting: Emergency Medicine

## 2013-09-10 DIAGNOSIS — I5043 Acute on chronic combined systolic (congestive) and diastolic (congestive) heart failure: Principal | ICD-10-CM | POA: Diagnosis present

## 2013-09-10 DIAGNOSIS — Z9079 Acquired absence of other genital organ(s): Secondary | ICD-10-CM

## 2013-09-10 DIAGNOSIS — G4730 Sleep apnea, unspecified: Secondary | ICD-10-CM | POA: Diagnosis present

## 2013-09-10 DIAGNOSIS — Z860100 Personal history of colon polyps, unspecified: Secondary | ICD-10-CM

## 2013-09-10 DIAGNOSIS — IMO0002 Reserved for concepts with insufficient information to code with codable children: Secondary | ICD-10-CM | POA: Diagnosis present

## 2013-09-10 DIAGNOSIS — I509 Heart failure, unspecified: Secondary | ICD-10-CM | POA: Insufficient documentation

## 2013-09-10 DIAGNOSIS — E785 Hyperlipidemia, unspecified: Secondary | ICD-10-CM | POA: Diagnosis present

## 2013-09-10 DIAGNOSIS — E875 Hyperkalemia: Secondary | ICD-10-CM | POA: Diagnosis not present

## 2013-09-10 DIAGNOSIS — Z95810 Presence of automatic (implantable) cardiac defibrillator: Secondary | ICD-10-CM

## 2013-09-10 DIAGNOSIS — I2510 Atherosclerotic heart disease of native coronary artery without angina pectoris: Secondary | ICD-10-CM | POA: Diagnosis present

## 2013-09-10 DIAGNOSIS — Z85038 Personal history of other malignant neoplasm of large intestine: Secondary | ICD-10-CM

## 2013-09-10 DIAGNOSIS — E119 Type 2 diabetes mellitus without complications: Secondary | ICD-10-CM | POA: Diagnosis present

## 2013-09-10 DIAGNOSIS — Z8679 Personal history of other diseases of the circulatory system: Secondary | ICD-10-CM

## 2013-09-10 DIAGNOSIS — I2789 Other specified pulmonary heart diseases: Secondary | ICD-10-CM | POA: Diagnosis present

## 2013-09-10 DIAGNOSIS — Z96659 Presence of unspecified artificial knee joint: Secondary | ICD-10-CM

## 2013-09-10 DIAGNOSIS — Z87891 Personal history of nicotine dependence: Secondary | ICD-10-CM

## 2013-09-10 DIAGNOSIS — I359 Nonrheumatic aortic valve disorder, unspecified: Secondary | ICD-10-CM | POA: Diagnosis present

## 2013-09-10 DIAGNOSIS — Z7901 Long term (current) use of anticoagulants: Secondary | ICD-10-CM

## 2013-09-10 DIAGNOSIS — I10 Essential (primary) hypertension: Secondary | ICD-10-CM | POA: Diagnosis present

## 2013-09-10 DIAGNOSIS — E1165 Type 2 diabetes mellitus with hyperglycemia: Secondary | ICD-10-CM | POA: Diagnosis present

## 2013-09-10 DIAGNOSIS — Z8249 Family history of ischemic heart disease and other diseases of the circulatory system: Secondary | ICD-10-CM

## 2013-09-10 DIAGNOSIS — Z85828 Personal history of other malignant neoplasm of skin: Secondary | ICD-10-CM

## 2013-09-10 DIAGNOSIS — I129 Hypertensive chronic kidney disease with stage 1 through stage 4 chronic kidney disease, or unspecified chronic kidney disease: Secondary | ICD-10-CM | POA: Diagnosis present

## 2013-09-10 DIAGNOSIS — Z9861 Coronary angioplasty status: Secondary | ICD-10-CM

## 2013-09-10 DIAGNOSIS — N2581 Secondary hyperparathyroidism of renal origin: Secondary | ICD-10-CM | POA: Diagnosis present

## 2013-09-10 DIAGNOSIS — M1990 Unspecified osteoarthritis, unspecified site: Secondary | ICD-10-CM | POA: Diagnosis present

## 2013-09-10 DIAGNOSIS — I5023 Acute on chronic systolic (congestive) heart failure: Secondary | ICD-10-CM

## 2013-09-10 DIAGNOSIS — I442 Atrioventricular block, complete: Secondary | ICD-10-CM | POA: Diagnosis present

## 2013-09-10 DIAGNOSIS — Z4502 Encounter for adjustment and management of automatic implantable cardiac defibrillator: Secondary | ICD-10-CM

## 2013-09-10 DIAGNOSIS — I4891 Unspecified atrial fibrillation: Secondary | ICD-10-CM | POA: Diagnosis present

## 2013-09-10 DIAGNOSIS — Z9049 Acquired absence of other specified parts of digestive tract: Secondary | ICD-10-CM

## 2013-09-10 DIAGNOSIS — Z970 Presence of artificial eye: Secondary | ICD-10-CM

## 2013-09-10 DIAGNOSIS — I059 Rheumatic mitral valve disease, unspecified: Secondary | ICD-10-CM | POA: Diagnosis present

## 2013-09-10 DIAGNOSIS — E876 Hypokalemia: Secondary | ICD-10-CM | POA: Diagnosis not present

## 2013-09-10 DIAGNOSIS — I428 Other cardiomyopathies: Secondary | ICD-10-CM | POA: Diagnosis present

## 2013-09-10 DIAGNOSIS — N189 Chronic kidney disease, unspecified: Secondary | ICD-10-CM

## 2013-09-10 DIAGNOSIS — I482 Chronic atrial fibrillation, unspecified: Secondary | ICD-10-CM | POA: Diagnosis present

## 2013-09-10 DIAGNOSIS — Z8546 Personal history of malignant neoplasm of prostate: Secondary | ICD-10-CM

## 2013-09-10 DIAGNOSIS — N184 Chronic kidney disease, stage 4 (severe): Secondary | ICD-10-CM | POA: Diagnosis present

## 2013-09-10 HISTORY — DX: Endocarditis, valve unspecified: I38

## 2013-09-10 HISTORY — DX: Dizziness and giddiness: R42

## 2013-09-10 HISTORY — DX: Pulmonary hypertension, unspecified: I27.20

## 2013-09-10 HISTORY — DX: Chronic atrial fibrillation, unspecified: I48.20

## 2013-09-10 HISTORY — DX: Chronic systolic (congestive) heart failure: I50.22

## 2013-09-10 LAB — CBC WITH DIFFERENTIAL/PLATELET
Basophils Absolute: 0 10*3/uL (ref 0.0–0.1)
Basophils Relative: 0 % (ref 0–1)
Eosinophils Absolute: 0.2 10*3/uL (ref 0.0–0.7)
Eosinophils Relative: 2 % (ref 0–5)
Lymphs Abs: 1.7 10*3/uL (ref 0.7–4.0)
MCH: 31 pg (ref 26.0–34.0)
MCV: 95.1 fL (ref 78.0–100.0)
Monocytes Absolute: 0.7 10*3/uL (ref 0.1–1.0)
Platelets: 123 10*3/uL — ABNORMAL LOW (ref 150–400)
RDW: 14.9 % (ref 11.5–15.5)

## 2013-09-10 LAB — HEMOGLOBIN A1C
Hgb A1c MFr Bld: 7.4 % — ABNORMAL HIGH (ref <5.7)
Mean Plasma Glucose: 166 mg/dL — ABNORMAL HIGH (ref <117)

## 2013-09-10 LAB — COMPREHENSIVE METABOLIC PANEL
ALT: 16 U/L (ref 0–53)
AST: 23 U/L (ref 0–37)
Alkaline Phosphatase: 37 U/L — ABNORMAL LOW (ref 39–117)
Calcium: 9.4 mg/dL (ref 8.4–10.5)
Creatinine, Ser: 3.3 mg/dL — ABNORMAL HIGH (ref 0.50–1.35)
GFR calc Af Amer: 19 mL/min — ABNORMAL LOW (ref >90)
Glucose, Bld: 90 mg/dL (ref 70–99)
Potassium: 4.1 mEq/L (ref 3.5–5.1)
Sodium: 142 mEq/L (ref 135–145)
Total Protein: 7.2 g/dL (ref 6.0–8.3)

## 2013-09-10 LAB — URINE MICROSCOPIC-ADD ON

## 2013-09-10 LAB — URINALYSIS, ROUTINE W REFLEX MICROSCOPIC
Bilirubin Urine: NEGATIVE
Glucose, UA: NEGATIVE mg/dL
Ketones, ur: NEGATIVE mg/dL
Leukocytes, UA: NEGATIVE
Nitrite: NEGATIVE
Protein, ur: >300 mg/dL — AB
Specific Gravity, Urine: 1.02 (ref 1.005–1.030)
Urobilinogen, UA: 0.2 mg/dL (ref 0.0–1.0)
pH: 5.5 (ref 5.0–8.0)

## 2013-09-10 LAB — GLUCOSE, CAPILLARY: Glucose-Capillary: 169 mg/dL — ABNORMAL HIGH (ref 70–99)

## 2013-09-10 LAB — APTT: aPTT: 62 seconds — ABNORMAL HIGH (ref 24–37)

## 2013-09-10 LAB — TROPONIN I: Troponin I: <0.3 ng/mL (ref <0.30)

## 2013-09-10 LAB — DIGOXIN LEVEL: Digoxin Level: 0.7 ng/mL — ABNORMAL LOW (ref 0.8–2.0)

## 2013-09-10 MED ORDER — HYDRALAZINE HCL 25 MG PO TABS
25.0000 mg | ORAL_TABLET | Freq: Three times a day (TID) | ORAL | Status: DC
  Administered 2013-09-10 – 2013-09-13 (×8): 25 mg via ORAL
  Filled 2013-09-10 (×12): qty 1

## 2013-09-10 MED ORDER — MECLIZINE HCL 25 MG PO TABS
25.0000 mg | ORAL_TABLET | Freq: Three times a day (TID) | ORAL | Status: DC | PRN
  Filled 2013-09-10: qty 1

## 2013-09-10 MED ORDER — ASPIRIN 81 MG PO CHEW
81.0000 mg | CHEWABLE_TABLET | Freq: Every day | ORAL | Status: DC
  Administered 2013-09-11 – 2013-09-13 (×3): 81 mg via ORAL
  Filled 2013-09-10 (×2): qty 1

## 2013-09-10 MED ORDER — ISOSORBIDE MONONITRATE 15 MG HALF TABLET
15.0000 mg | ORAL_TABLET | Freq: Every day | ORAL | Status: DC
  Administered 2013-09-11 – 2013-09-13 (×3): 15 mg via ORAL
  Filled 2013-09-10 (×3): qty 1

## 2013-09-10 MED ORDER — METOPROLOL SUCCINATE ER 100 MG PO TB24
100.0000 mg | ORAL_TABLET | Freq: Two times a day (BID) | ORAL | Status: DC
  Administered 2013-09-10 – 2013-09-12 (×4): 100 mg via ORAL
  Filled 2013-09-10 (×6): qty 1

## 2013-09-10 MED ORDER — INSULIN ASPART 100 UNIT/ML ~~LOC~~ SOLN
0.0000 [IU] | Freq: Three times a day (TID) | SUBCUTANEOUS | Status: DC
  Administered 2013-09-10: 2 [IU] via SUBCUTANEOUS
  Administered 2013-09-10: 1 [IU] via SUBCUTANEOUS
  Administered 2013-09-11: 2 [IU] via SUBCUTANEOUS

## 2013-09-10 MED ORDER — ONDANSETRON HCL 4 MG/2ML IJ SOLN: 4.0000 mg | Freq: Four times a day (QID) | INTRAMUSCULAR | Status: DC | PRN

## 2013-09-10 MED ORDER — FUROSEMIDE 10 MG/ML IJ SOLN
80.0000 mg | Freq: Two times a day (BID) | INTRAMUSCULAR | Status: DC
  Administered 2013-09-10 – 2013-09-11 (×2): 80 mg via INTRAVENOUS
  Filled 2013-09-10 (×4): qty 8

## 2013-09-10 MED ORDER — DIGOXIN 125 MCG PO TABS
0.1250 mg | ORAL_TABLET | ORAL | Status: DC
  Administered 2013-09-11 – 2013-09-13 (×2): 0.125 mg via ORAL
  Filled 2013-09-10 (×2): qty 1

## 2013-09-10 MED ORDER — SODIUM CHLORIDE 0.9 % IV SOLN: 250.0000 mL | INTRAVENOUS | Status: DC | PRN

## 2013-09-10 MED ORDER — FUROSEMIDE 10 MG/ML IJ SOLN: 80.0000 mg | Freq: Two times a day (BID) | INTRAMUSCULAR | Status: DC

## 2013-09-10 MED ORDER — NITROGLYCERIN 0.4 MG SL SUBL: 0.4000 mg | SUBLINGUAL_TABLET | SUBLINGUAL | Status: DC | PRN

## 2013-09-10 MED ORDER — POTASSIUM CHLORIDE CRYS ER 20 MEQ PO TBCR
20.0000 meq | EXTENDED_RELEASE_TABLET | Freq: Every day | ORAL | Status: DC
  Administered 2013-09-11: 20 meq via ORAL
  Filled 2013-09-10 (×2): qty 1

## 2013-09-10 MED ORDER — INSULIN ASPART PROT & ASPART (70-30 MIX) 100 UNIT/ML ~~LOC~~ SUSP
45.0000 [IU] | Freq: Two times a day (BID) | SUBCUTANEOUS | Status: DC
  Administered 2013-09-10 – 2013-09-13 (×5): 45 [IU] via SUBCUTANEOUS
  Filled 2013-09-10: qty 10

## 2013-09-10 MED ORDER — SODIUM CHLORIDE 0.9 % IJ SOLN: 3.0000 mL | INTRAMUSCULAR | Status: DC | PRN

## 2013-09-10 MED ORDER — FUROSEMIDE 10 MG/ML IJ SOLN
INTRAMUSCULAR | Status: AC
  Administered 2013-09-10: 80 mg via INTRAVENOUS
  Filled 2013-09-10: qty 8

## 2013-09-10 MED ORDER — FUROSEMIDE 10 MG/ML IJ SOLN
80.0000 mg | Freq: Once | INTRAMUSCULAR | Status: AC
  Administered 2013-09-10: 80 mg via INTRAVENOUS
  Filled 2013-09-10: qty 8

## 2013-09-10 MED ORDER — DIGOXIN 125 MCG PO TABS: 0.1250 mg | ORAL_TABLET | Freq: Every day | ORAL | Status: DC

## 2013-09-10 MED ORDER — SODIUM CHLORIDE 0.9 % IJ SOLN
3.0000 mL | Freq: Two times a day (BID) | INTRAMUSCULAR | Status: DC
  Administered 2013-09-10 – 2013-09-12 (×3): 3 mL via INTRAVENOUS

## 2013-09-10 MED ORDER — INSULIN ASPART 100 UNIT/ML ~~LOC~~ SOLN: 0.0000 [IU] | Freq: Every day | SUBCUTANEOUS | Status: DC

## 2013-09-10 MED ORDER — ACETAMINOPHEN 325 MG PO TABS
650.0000 mg | ORAL_TABLET | ORAL | Status: DC | PRN
  Filled 2013-09-10: qty 2

## 2013-09-10 NOTE — ED Notes (Signed)
Pt reports difficulty breathing when lying down, started around 1900 last pm, reports increased edema to bilateral le

## 2013-09-10 NOTE — H&P (Signed)
History and Physical       Hospital Admission Note Date: 09/10/2013  Patient name: [name] [name]. Medical record number: [id number] Date of birth: Jul 04, 1931 Age: 79 y.o. Gender: male  PCP: Judie Petit, MD    Chief Complaint:  Shortness of breath  HPI: Patient is a 79 year old male with history of chronic systolic CHF with EF of 20%, CAD, atrial fibrillation presented to the ER with shortness of breath. History was obtained from the patient and his family in the room. Patient reported that yesterday around 5 PM he started having difficulty catching his breath, even at rest. He did mention dry cough but no productive phlegm, fever, chills or any recent upper respiratory illness. Denied any chest pains, palpitations, diaphoresis. He denied any recent weight gain, has some lower extremity swelling.  Patient's family however reported that he has been eating salted pistachios (patient himself admitted as well), and canned food.    patient has a history of chronic kidney disease, stage III, follows Dr. Eliott Nine.  In the ER, BNP was 10,776 chest x-ray showed interstitial edema   Review of Systems:  Constitutional: Denies fever, chills, diaphoresis, poor appetite and fatigue.  HEENT: Denies photophobia, eye pain, redness, hearing loss, ear pain, congestion, sore throat, rhinorrhea, sneezing, mouth sores, trouble swallowing, neck pain, neck stiffness and tinnitus.   Respiratory: Denies chest tightness,  and wheezing.   Cardiovascular:  please see history of present illness  Gastrointestinal: Denies nausea, vomiting, abdominal pain, diarrhea, constipation, blood in stool and abdominal distention.  Genitourinary: Denies dysuria, urgency, frequency, hematuria, flank pain and difficulty urinating.  Musculoskeletal: Denies myalgias, back pain, joint swelling, arthralgias and gait problem.  Skin: Denies pallor, rash and wound.   Neurological: Denies dizziness, seizures, syncope, weakness, light-headedness, numbness and headaches.  Hematological: Denies adenopathy. Easy bruising, personal or family bleeding history  Psychiatric/Behavioral: Denies suicidal ideation, mood changes, confusion, nervousness, sleep disturbance and agitation  Past Medical History: Past Medical History  Diagnosis Date  . Atrial fibrillation   . CARDIOMYOPATHY, PRIMARY, DILATED   . COLON CANCER, HX OF   . CONGESTIVE HEART FAILURE   . CORONARY ARTERY DISEASE   . PROSTATE CANCER, HX OF   . RENAL DISEASE, CHRONIC, STAGE III   . SKIN CANCER, HX OF   . SLEEP APNEA   . Hypertension   . Hyperlipidemia   . Diabetes mellitus   . Hyperkalemia   . Arthritis   . Osteoarthritis   . Tubular adenoma of colon   . Secondary hyperparathyroidism   . Automatic implantable cardioverter-defibrillator in situ   . Shortness of breath   . Pacemaker    Past Surgical History  Procedure Laterality Date  . Colectomy    . Prostatectomy    . Penile prosthesis placement    . Removed prosthetic eye    . Ptca      stent placed  . Total knee arthroplasty  2008    right  . St jude unify      St. Jude single chamber defibrillator 07/19/09 (Dr. Graciela Husbands)  . Pacemaker insertion  2010  . Av fistula placement  03/30/2012    Procedure: ARTERIOVENOUS (AV) FISTULA CREATION;  Surgeon: Chuck Hint, MD;  Location: [facility] OR;  Service: Vascular;  Laterality: Right;  Creation of BrachioCephalic Fistula Right arm  . Cardiac catheterization    . Coronary angioplasty    . Eye surgery      left eye prothesis  . Fracture surgery  collar bone, right knee replacement  . Insert / replace / remove pacemaker      Medications: Prior to Admission medications   Medication Sig Start Date End Date Taking? Authorizing Provider  aspirin 81 MG chewable tablet Chew 81 mg by mouth daily.   Yes Historical Provider, MD  digoxin (LANOXIN) 0.125 MG tablet Take 0.125 mg by mouth  daily.   Yes Historical Provider, MD  furosemide (LASIX) 80 MG tablet TAKE 1 TABLET BY MOUTH EVERY DAY 06/20/13  Yes Lindley Magnus, MD  hydrALAZINE (APRESOLINE) 25 MG tablet Take 25 mg by mouth 3 (three) times daily. 10/06/12  Yes Bruce Romilda Garret, MD  insulin lispro protamine-lispro (HUMALOG 75/25) (75-25) 100 UNIT/ML SUSP injection Inject 45 Units into the skin 2 (two) times daily with a meal.   Yes Historical Provider, MD  isosorbide mononitrate (IMDUR) 30 MG 24 hr tablet Take 0.5 tablets (15 mg total) by mouth daily. 01/31/13  Yes Bruce Romilda Garret, MD  lisinopril (PRINIVIL,ZESTRIL) 20 MG tablet Take 10 mg by mouth daily.   Yes Historical Provider, MD  meclizine (ANTIVERT) 25 MG tablet Take 1 tablet (25 mg total) by mouth 3 (three) times daily as needed for dizziness or nausea. 06/03/13  Yes Doe-Hyun R Artist Pais, DO  metoprolol succinate (TOPROL-XL) 100 MG 24 hr tablet Take 100 mg by mouth 2 (two) times daily. Take with or immediately following a meal.   Yes Historical Provider, MD  potassium chloride SA (K-DUR,KLOR-CON) 20 MEQ tablet Take 20 mEq by mouth daily.    Yes Historical Provider, MD  warfarin (COUMADIN) 5 MG tablet Take 2.5-5 mg by mouth daily. Monday, Wednesday, Friday, takes 2.5 mg; Tuesday, Thursday, Saturday, Sunday takes 5 mg   Yes Historical Provider, MD  glucose blood (FREESTYLE TEST STRIPS) test strip 1 each by Other route as needed. Use as instructed     Historical Provider, MD  nitroGLYCERIN (NITROSTAT) 0.4 MG SL tablet Place 0.4 mg under the tongue every 5 (five) minutes x 3 doses as needed for chest pain.    Historical Provider, MD    Allergies:   Allergies  Allergen Reactions  . Prednisone Other (See Comments)    Raised blood sugar    Social History:  reports that he quit smoking about 47 years ago. His smoking use included Cigarettes. He smoked 0.00 packs per day. He has never used smokeless tobacco. He reports that he drinks alcohol beer occasionally, . He reports that he does  not use illicit drugs.Patient lives at home and ambulates without any assistance   Family History: Family History  Problem Relation Age of Onset  . Heart disease Father   . Throat cancer Father   . Throat cancer Mother   . Heart disease Mother   . Hypertension Mother     Physical Exam: Blood pressure 161/77, pulse 67, temperature 97.2 F (36.2 C), temperature source Oral, resp. rate 18, height 6' (1.829 m), weight 90.3 kg (199 lb 1.2 oz), SpO2 97.00%. General: Alert, awake, oriented x3, in no acute distress. HEENT: normocephalic, atraumatic, anicteric sclera, pink conjunctiva, pupils equal and reactive to light and accomodation, oropharynx clear Neck: supple, no masses or lymphadenopathy, no goiter, no bruits JVD+ Heart: Regular rate and rhythm, without murmurs, rubs or gallops. Lungs:  decreased breath sound at the bases  Abdomen: Soft, nontender, nondistended, positive bowel sounds, no masses. Extremities: No clubbing, cyanosis. 1+ edema with positive pedal pulses. Neuro: Grossly intact, no focal neurological deficits, strength 5/5 upper and lower extremities  bilaterally Psych: alert and oriented x 3, normal mood and affect Skin: no rashes or lesions, warm and dry   LABS on Admission:  Basic Metabolic Panel:  Recent Labs Lab 09/10/13 0315  NA 142  K 4.1  CL 103  CO2 26  GLUCOSE 90  BUN 49*  CREATININE 3.30*  CALCIUM 9.4   Liver Function Tests:  Recent Labs Lab 09/10/13 0315  AST 23  ALT 16  ALKPHOS 37*  BILITOT 0.8  PROT 7.2  ALBUMIN 3.8   No results found for this basename: LIPASE, AMYLASE,  in the last 168 hours No results found for this basename: AMMONIA,  in the last 168 hours CBC:  Recent Labs Lab 09/10/13 0315  WBC 8.0  NEUTROABS 5.4  HGB 14.5  HCT 44.5  MCV 95.1  PLT 123*   Cardiac Enzymes:  Recent Labs Lab 09/10/13 0315  TROPONINI <0.30   BNP: No components found with this basename: POCBNP,  CBG: No results found for this  basename: GLUCAP,  in the last 168 hours   Radiological Exams on Admission: Dg Chest 2 View  09/10/2013   CLINICAL DATA:  Shortness of breath  EXAM: CHEST  2 VIEW  COMPARISON:  04/30/2013  FINDINGS: Unchanged orientation of biventricular ICD/ pacer from the left. Stable cardiomegaly.  Hyperinflated lungs. There is diffuse interstitial coarsening and fissural thickening. There may be early Kerley B-lines, especially in the lateral projection. No significant effusion. No pneumothorax. Remote left clavicle fracture.  IMPRESSION: Pulmonary venous congestion, bordering on interstitial edema.   Electronically Signed   By: Tiburcio Pea M.D.   On: 09/10/2013 03:51    Assessment/Plan Principal Problem:   Systolic and diastolic CHF, acute on chronic; likely precipitated due to dietary indiscretions, patient reports compliant with medications. Patient was given 80 mg IV Lasix in the ER which did improve his symptoms - Admit to telemetry, obtained cardiac enzymes, started on IV lasix 80mg  BID, CHF pathway, Beta blocker, digoxin - Repeat 2-D echocardiogram - Cardiology consultation  Active Problems:   DIABETES MELLITUS, TYPE II -Continue NPH insulin, sliding scale insulin, hemoglobin A1c     HYPERTENSION - Currently stable, continue IV Lasix, beta blocker, Imdur, hydralazine    CORONARY ARTERY DISEASE - No chest pain, continue aspirin, digoxin, beta blocker, Imdur    Atrial fibrillation - Currently rate controlled, continue Toprol-XL -  continue Coumadin per pharmacy     Chronic renal insufficiency, stage IV (severe)   - Monitor creatinine closely with IV diuresis   DVT prophylaxis:  on Coumadin   CODE STATUS:  FULL CODE STATUS   Family Communication: Admission, patients condition and plan of care including tests being ordered have been discussed with the patient, his wife, and family who indicates understanding and agree with the plan and Code Status   Further plan will depend as  patient's clinical course evolves and further radiologic and laboratory data become available.   Time Spent on Admission: 1 hour   RAI,RIPUDEEP M.D. [facility] 09/10/2013, 11:26 AM Pager: [phone]  If 7PM-7AM, please contact night-coverage [email or web address] Password [email or web address]

## 2013-09-10 NOTE — Progress Notes (Signed)
Device interrogation revealed normal function, chronic afib, v pacing only 58% of the time, increased impedence suggestive of CHF. No ventricular arrhythmias. Ayyan Sites PA-C

## 2013-09-10 NOTE — Progress Notes (Signed)
ANTICOAGULATION CONSULT NOTE - Initial Consult  Pharmacy Consult for Coumadin Indication: atrial fibrillation  Allergies  Allergen Reactions  . Prednisone Other (See Comments)    Raised blood sugar    Patient Measurements: Height: 6' (182.9 cm) Weight: 199 lb 1.2 oz (90.3 kg) IBW/kg (Calculated) : 77.6  Vital Signs: Temp: 97.2 F (36.2 C) (12/20 0730) Temp src: Oral (12/20 0730) BP: 161/77 mmHg (12/20 0730) Pulse Rate: 67 (12/20 0730)  Labs:  Recent Labs  09/10/13 0315  HGB 14.5  HCT 44.5  PLT 123*  APTT 62*  LABPROT 29.2*  INR 2.89*  CREATININE 3.30*  TROPONINI <0.30    Estimated Creatinine Clearance: 19.6 ml/min (by C-G formula based on Cr of 3.3).   Medical History: Past Medical History  Diagnosis Date  . Atrial fibrillation   . CARDIOMYOPATHY, PRIMARY, DILATED   . COLON CANCER, HX OF   . CONGESTIVE HEART FAILURE   . CORONARY ARTERY DISEASE   . PROSTATE CANCER, HX OF   . RENAL DISEASE, CHRONIC, STAGE III   . SKIN CANCER, HX OF   . SLEEP APNEA   . Hypertension   . Hyperlipidemia   . Diabetes mellitus   . Hyperkalemia   . Arthritis   . Osteoarthritis   . Tubular adenoma of colon   . Secondary hyperparathyroidism   . Automatic implantable cardioverter-defibrillator in situ   . Shortness of breath   . Pacemaker     Assessment: 79 year old male with a history of CHF (EF=20%), CAD, and atrial fibrillation presented to the ED with SOB.  On Coumadin PTA with therapeutic INR on admission  Goal of Therapy:  INR 2-3 Monitor platelets by anticoagulation protocol: Yes   Plan:  1) Coumadin 2.5 mg po x 1 dose at 1800 pm 2) Daily INR  Thank you. Okey Regal, PharmD [phone]  09/10/2013,12:35 PM

## 2013-09-10 NOTE — Consult Note (Signed)
Cardiology Consultation Note  Patient ID: [name] [name]., MRN: [id number], DOB/AGE: 1930-10-03 79 y.o. Admit date: 09/10/2013   Date of Consult: 09/10/2013 Primary Physician: [name] Petit, MD Primary Cardiologist: Dr. Excell [name], EP - Dr. Graciela [name] Primary Nephrologist: Dr. Eliott [name]  Chief Complaint: "hyperventilating" Reason for Consult: CHF  HPI: [name] is an 79 y/o M with history of chronic atrial fibrillation on Coumadin, chronic systolic CHF with mixed ICM/NICM s/p BiV-ICD, CAD s/p LAD stent 2009, CKD stage IV (AV fistula placed 03/2012) who presented to [facility] from [facility] today with SOB. He saw Dr. Graciela [name] just a few days ago on 12/16. In the past the patient had historical difficulty with rate control, so CRT-D was placed in 2010 in anticipation for AV ablation. However, rate control was then able to be achieved so ablation has been deferred. At the most recent office visit, Dr. Graciela [name] noted that the patient's increasing heart failure symptoms with LV dysfunction and permanent atrial fib with only 72% V-pacing begged the issue of whether AV ablation makes sense. The patient elected to discuss this in the future with Dr. Excell [name]. He presented to [facility] today episodes that he calls "hyperventilating." He worked yesterday at [employer] and returning carts from the parking lot 12-4:30pm yesterday without any CP or dyspnea. Around 5pm he began having episodes of intermittent "hyperventilation" where he would begin panting and feel SOB. This was not associated with any CP, nausea, vomiting, palpitations, ICD discharge, near syncope or syncope. The hyperventilation sensation was no different sitting reclined or laying completely flat, but he did feel better when sitting completely upright on the commode. A BM did not change his symptoms. He was able to fall asleep but woke up around 1pm and had a hard time getting back to sleep. He spent the rest of the night in  his easy chair and when symptoms didn't improve this AM, he proceed to [facility]. Per report he has been eating salted pistachios and canned food. He reports a baseline weight of 198-202, is 199 today. Has mild chronic LEE, unchanged, no abdominal distention, and no disruption in urination recently. pBNP 10k, troponin neg x 1, BUN/Cr 49/3.3 (baseline Cr appears 2.8-3.4 over last year and a half), mildly decreased plt 123 appearing chronic, digoxin level 0.7, therapeutic INR 2.89, UA with >300 protein but albumin/Tprot WNL. CXR bordering on intersitital edema. He was hypertensive on arrival at 171/79. He was given 80mg  IV Lasix at 0521 with -1100 documented UOP thus far.  In retrospect he believes he's had a few of these hyperventilating episodes in the last several days. He cares for his wife who has stage IV lung cancer and initially chalked it up to just being anxious about this.  Past Medical History  Diagnosis Date  . Chronic atrial fibrillation     a. Historically difficult rate control. b. s/p CRT-D implantation with anticipation of AV junction ablation but patient then was able to accomplish rate control and ablation not undertaken.  [name] CARDIOMYOPATHY, PRIMARY, DILATED     a. Mixed ICM/NICM - EF 15% by echo 05/2009; St. Jude CRT-D implanted 06/2009. b. Echo 04/2013 - EF 20%, mild LVH.  [name] COLON CANCER, HX OF     a. s/p partial colectomy.  . Chronic systolic CHF (congestive heart failure)   . CORONARY ARTERY DISEASE     a. BMS to LAD 04/2008. b. Cath 06/2009: nonobstructive disease.  [name] PROSTATE CANCER, HX OF  a. s/p radical prostatectomy.  [name] SKIN CANCER, HX OF   . SLEEP APNEA   . Hypertension   . Hyperlipidemia   . Diabetes mellitus   . Hyperkalemia   . Arthritis   . Osteoarthritis   . Tubular adenoma of colon   . Secondary hyperparathyroidism   . Automatic implantable cardioverter-defibrillator in situ   . CKD (chronic kidney disease), stage IV     a. Right brachiocephalic AV  fistula 03/2012. Neph = [name] [name].  . Vertigo   . Valvular heart disease     a. Echo 04/2013: mild AI, mild MR.  . Pulmonary HTN     a. PA pressure by echo 05/01/13.      Most Recent Cardiac Studies: 2D Echo 05/01/13 - Left ventricle: The cavity size was normal. Wall thickness was increased in a pattern of mild LVH. The estimated ejection fraction was 20%. Diffuse hypokinesis. Doppler parameters are consistent with probable restrictive physiology, indicative of decreased left ventricular diastolic compliance and/or increased left atrial pressure. - Aortic valve: Mild regurgitation. - Mitral valve: Mild regurgitation. - Left atrium: The atrium was mildly dilated. - Right ventricle: Systolic function was mildly reduced. - Right atrium: The atrium was mildly dilated. - Pulmonary arteries: PA peak pressure: 49mm Hg (S).   Cardiac Cath 06/2009 PROCEDURES: 1. Right and left heart catheterization. 2. Selective coronary arteriography. 3. No ventriculography was performed. DESCRIPTION OF PROCEDURE:  The patient was brought to the cath lab and prepped and draped in the usual fashion.  A 7-French sheath and 7.5 French thermodilution Swan-Ganz catheter was used for right heart catheterization, left ventricular pressures were measured with a pigtail.  Thermodilution cardiac outputs were performed.  Saturations were obtained in appropriate chambers.  Coronary arteriography was performed with 5-French catheters with less than 25 mL of contrast.  He tolerated the procedure well.  ACT was checked because the patient was on intravenous heparin.  There were no major complications.  He was taken to the holding area in satisfactory clinical condition.  I then spoke with his wife. HEMODYNAMIC DATA: 1. Right atrial pressure 7. 2. RV 42/7. 3. Pulmonary artery 47/23, mean 33. 4. Pulmonary capillary wedge 18. 5. LV 144/19. 6. Aortic 145/81, mean 115. 7. Pulmonary artery saturation 53%. 8.  Superior vena cava saturation 55%. 9. Aortic saturation 93%. 10.Fick cardiac output 4.12 liters per minute. 11.Fick cardiac index 1.93 liters per minute per meter squared. 12.Thermodilution cardiac output 2.6 liters per minute. 13.Thermodilution cardiac index 1.21 liters per minute per meter     squared. ANGIOGRAPHIC DATA: 1. The right coronary artery is a moderately calcified vessel     throughout.  It is, however, large distribution vessel of large     caliber.  There are multiple areas of calcification with focal     narrowing in the mid vessel of about 30%.  The PDA and three     posterolateral branches, which are moderately larger are all free     of critical disease. 2. The left main is calcified as are the LAD and circumflex.  They     represent separate ostia. 3. The left anterior descending artery probably has about 30%     calcified plaque in the proximal segment.  The vessel then     bifurcates with essentially two major diagonal branches coming off     the same site just prior to the previously placed stent.  The first     diagonal [place] is relatively small in caliber,  but bifurcates and     has 70% ostial narrowing.  The second one likewise has 70% ostial     narrowing bifurcates distally and trifurcates and is a very large-     caliber vessel.  Neither of them appear critical.  The LAD itself     has been previously stented just beyond this and has modest healing     of about 30 to no more than 40% within the stent, so there is not     what would considered to be in-stent restenosis.  The vessel then     opens up and courses to the cardiac apex, which is widely patent. 4. The circumflex is a fairly large-caliber vessel.  There is probably     about 20% ostial narrowing at its separate take off, the vessel     then provides a small marginal, a moderate marginal, and a large     marginal Annalyce Lanpher, other than minor luminal irregularity, no critical     stenoses are  noted. CONCLUSIONS: 1. Nonischemic cardiomyopathy with reduced cardiac output and mild     elevated pulmonary pressures. 2. Continued patency of the left anterior descending artery. 3. Scattered noncritical disease as expected with the patient's     underlying diabetes, but with left ventricular function out of     proportion to the extent of coronary disease. DISPOSITION:  I have spoken with Dr. Cato Mulligan.  He has persistent atrial fibrillation, rate controlled.  Beta blockade will be recommended.  I will also speak with my other colleagues other options with regard to his heart failure.   Surgical History:  Past Surgical History  Procedure Laterality Date  . Colectomy    . Prostatectomy    . Penile prosthesis placement    . Removed prosthetic eye    . Ptca      stent placed  . Total knee arthroplasty  2008    right  . St jude unify      St. Jude single chamber defibrillator 07/19/09 (Dr. Graciela [name])  . Pacemaker insertion  2010  . Av fistula placement  03/30/2012    Procedure: ARTERIOVENOUS (AV) FISTULA CREATION;  Surgeon: Chuck Hint, MD;  Location: [facility] OR;  Service: Vascular;  Laterality: Right;  Creation of BrachioCephalic Fistula Right arm  . Cardiac catheterization    . Coronary angioplasty    . Eye surgery      left eye prothesis  . Fracture surgery      collar bone, right knee replacement  . Insert / replace / remove pacemaker       Home Meds: Prior to Admission medications   Medication Sig Start Date End Date Taking? Authorizing Provider  aspirin 81 MG chewable tablet Chew 81 mg by mouth daily.   Yes Historical Provider, MD  digoxin (LANOXIN) 0.125 MG tablet Take 0.125 mg by mouth daily.   Yes Historical Provider, MD  furosemide (LASIX) 80 MG tablet TAKE 1 TABLET BY MOUTH EVERY DAY 06/20/13  Yes Lindley Magnus, MD  hydrALAZINE (APRESOLINE) 25 MG tablet Take 25 mg by mouth 3 (three) times daily. 10/06/12  Yes Bruce Romilda Garret, MD  insulin lispro protamine-lispro (HUMALOG  75/25) (75-25) 100 UNIT/ML SUSP injection Inject 45 Units into the skin 2 (two) times daily with a meal.   Yes Historical Provider, MD  isosorbide mononitrate (IMDUR) 30 MG 24 hr tablet Take 0.5 tablets (15 mg total) by mouth daily. 01/31/13  Yes Lindley Magnus, MD  lisinopril (PRINIVIL,ZESTRIL)  20 MG tablet Take 10 mg by mouth daily.   Yes Historical Provider, MD  meclizine (ANTIVERT) 25 MG tablet Take 1 tablet (25 mg total) by mouth 3 (three) times daily as needed for dizziness or nausea. 06/03/13  Yes Doe-Hyun R Artist Pais, DO  metoprolol succinate (TOPROL-XL) 100 MG 24 hr tablet Take 100 mg by mouth 2 (two) times daily. Take with or immediately following a meal.   Yes Historical Provider, MD  potassium chloride SA (K-DUR,KLOR-CON) 20 MEQ tablet Take 20 mEq by mouth daily.    Yes Historical Provider, MD  warfarin (COUMADIN) 5 MG tablet Take 2.5-5 mg by mouth daily. Monday, Wednesday, Friday, takes 2.5 mg; Tuesday, Thursday, Saturday, Sunday takes 5 mg   Yes Historical Provider, MD  glucose blood (FREESTYLE TEST STRIPS) test strip 1 each by Other route as needed. Use as instructed     Historical Provider, MD  nitroGLYCERIN (NITROSTAT) 0.4 MG SL tablet Place 0.4 mg under the tongue every 5 (five) minutes x 3 doses as needed for chest pain.    Historical Provider, MD    Inpatient Medications:  . [START ON 09/11/2013] aspirin  81 mg Oral Daily  . [START ON 09/11/2013] digoxin  0.125 mg Oral Daily  . furosemide  80 mg Intravenous BID  . hydrALAZINE  25 mg Oral TID  . insulin aspart  0-5 Units Subcutaneous QHS  . insulin aspart  0-9 Units Subcutaneous TID WC  . insulin aspart protamine- aspart  45 Units Subcutaneous BID WC  . [START ON 09/11/2013] isosorbide mononitrate  15 mg Oral Daily  . metoprolol succinate  100 mg Oral BID  . [START ON 09/11/2013] potassium chloride SA  20 mEq Oral Daily  . sodium chloride  3 mL Intravenous Q12H      Allergies:  Allergies  Allergen Reactions  . Prednisone Other  (See Comments)    Raised blood sugar    History   Social History  . Marital Status: Married    Spouse Name: N/A    Number of Children: 4  . Years of Education: N/A   Occupational History  . retired    Social History Main Topics  . Smoking status: Former Smoker    Types: Cigarettes    Quit date: 09/22/1966  . Smokeless tobacco: Never Used  . Alcohol Use: 0 - .5 oz/week    0-1 drink(s) per week     Comment: occasional  . Drug Use: No  . Sexual Activity: No   Other Topics Concern  . Not on file   Social History Narrative  . No narrative on file     Family History  Problem Relation Age of Onset  . Heart disease Father   . Throat cancer Father   . Throat cancer Mother   . Heart disease Mother   . Hypertension Mother      Review of Systems: General: negative for chills, fever Cardiovascular: negative for chest pain, palpitations Dermatological: negative for rash Respiratory: negative for wheezing. Has had intermittent cough for several months described as a tickle. Urologic: negative for hematuria Abdominal: negative for nausea, vomiting, diarrhea, bright red blood per rectum, melena, or hematemesis Neurologic: negative for visual changes, syncope, or dizziness All other systems reviewed and are otherwise negative except as noted above.  Labs:  Recent Labs  09/10/13 0315 09/10/13 1200  TROPONINI <0.30 <0.30   Lab Results  Component Value Date   WBC 8.0 09/10/2013   HGB 14.5 09/10/2013   HCT 44.5 09/10/2013  MCV 95.1 09/10/2013   PLT 123* 09/10/2013     Recent Labs Lab 09/10/13 0315  NA 142  K 4.1  CL 103  CO2 26  BUN 49*  CREATININE 3.30*  CALCIUM 9.4  PROT 7.2  BILITOT 0.8  ALKPHOS 37*  ALT 16  AST 23  GLUCOSE 90   Lab Results  Component Value Date   CHOL 149 05/01/2013   HDL 28* 05/01/2013   LDLCALC 81 05/01/2013   TRIG 201* 05/01/2013    Radiology/Studies:  Dg Chest 2 View 09/10/2013   CLINICAL DATA:  Shortness of breath  EXAM:  CHEST  2 VIEW  COMPARISON:  04/30/2013  FINDINGS: Unchanged orientation of biventricular ICD/ pacer from the left. Stable cardiomegaly.  Hyperinflated lungs. There is diffuse interstitial coarsening and fissural thickening. There may be early Kerley B-lines, especially in the lateral projection. No significant effusion. No pneumothorax. Remote left clavicle fracture.  IMPRESSION: Pulmonary venous congestion, bordering on interstitial edema.   Electronically Signed   By: Tiburcio Pea M.D.   On: 09/10/2013 03:51    EKG: atrial fib 78bpm with frequent v-paced complexes and PVCs, left axis deviation, anteroseptal infarct age undetermined, TWI avL, V5-V6 in nonpaced beats and TWI III in paced beats. No sig change from prior.  Physical Exam: Blood pressure 161/77, pulse 67, temperature 97.2 F (36.2 C), temperature source Oral, resp. rate 18, height 6' (1.829 m), weight 199 lb 1.2 oz (90.3 kg), SpO2 97.00%. General: Well developed elderly WM WM in no acute distress. Head: Normocephalic, atraumatic, sclera non-icteric, no xanthomas, nares are without discharge.  Neck: Negative for carotid bruits. JVD not elevated. Lungs: Coarse BS bilaterally to auscultation without wheezes, rales, or rhonchi. Breathing is unlabored. Heart: RRR with S1 S2. No murmurs, rubs, or gallops appreciated. Abdomen: Soft, non-tender, non-distended with normoactive bowel sounds. No hepatomegaly. No rebound/guarding. No obvious abdominal masses. Msk:  Strength and tone appear normal for age. Extremities: No clubbing or cyanosis. Tr bilat sockline edema.  Distal pedal pulses are 2+ and equal bilaterally. Neuro: Alert and oriented X 3. No facial asymmetry. No focal deficit. Moves all extremities spontaneously. Psych:  Responds to questions appropriately with a normal affect.    Assessment and Plan:  1. SOB 2. Acute on chronic systolic CHF/mixed ICM/NICM s/p CRT-D 3. Chronic atrial fibrillation on Coumadin 4. CKD stage IV 5.  HTN, elevated on admission 6. Diabetes mellitus  Plan as described below in attending note.   Signed, Ronie Spies PA-C 09/10/2013, 1:45 PM 79 yo male history of chronic afib on coumadin, chronc systolic CHF with mixed ICM/NICM LVEF 20% by echo 04/2013, presents with episodes of SOB and hyperventilation. No associated CP or palpitations. Symptoms continued throughout the day and night, the patient had to sleep in a recliner. He reports dietary indiscretion. CXR shows evidence of interstial edema, given lasix 80mg  IV with negative 1100 with some improvement in symptoms. He still has evidence of extra volume with elevated JVD and LE edema. Will give once more dose of IV lasix tonight, likely change to oral lasix tomorrow. Will have device interrogated, these symptoms of SOB seemed to be somewhat episodic, with his history of hard to manage afib will evaluate for possible symptomatic arrhtyhmia and possibly the exacerbating factor of his heart failure. Concern given his renal function daily dosing of digoxin despite his normal levels, will decrease digoxin to 0.125mg  every other day for now. If significant episodes of afib w/ RVR on device interrogation, may need to readdress the  possible need for av nodal ablation as discussed previously in EP clinic.    Dina Rich MD

## 2013-09-10 NOTE — ED Provider Notes (Addendum)
CSN: [id number]     Arrival date & time 09/10/13  0306 History   First MD Initiated Contact with Patient 09/10/13 [phone]     Chief Complaint  Patient presents with  . Shortness of Breath   (Consider location/radiation/quality/duration/timing/severity/associated sxs/prior Treatment) HPI Comments: 79 y.o. Male hx of CHF - EF 20%, CAD, afib with defibrillator and on coumadin comes in with cc of DIB. Pt reports that starting around 5 pm, he has been having dib - that is intermittent. There is no specific aggravating or relieving factors, and he frequently gets the dib even at rest. He has a dry cough. Pt denies any chest pain. No fever, chills, palpitations. Pt denies any weight gain, leg swelling, and he has no hx of PE, DVT and no risk factors for the same. Pt does admit that his breathinh is better upright vs. Laying flat.  Cards note shows: Cardiac catheterization in OCT 2010 Nonischemic cardiomyopathy with reduced cardiac output 2. Continued patency of the left anterior descending artery at site of previous stenting 3. Scattered noncritical disease ; left ventricular function out of proportion to the extent of coronary disease.   Echo 2014 demonstrated EF 20%     Patient is a 78 y.o. male presenting with shortness of breath. The history is provided by the patient and medical records.  Shortness of Breath Associated symptoms: cough   Associated symptoms: no chest pain, no fever, no headaches, no neck pain and no rash     Past Medical History  Diagnosis Date  . Atrial fibrillation   . CARDIOMYOPATHY, PRIMARY, DILATED   . COLON CANCER, HX OF   . CONGESTIVE HEART FAILURE   . CORONARY ARTERY DISEASE   . PROSTATE CANCER, HX OF   . RENAL DISEASE, CHRONIC, STAGE III   . SKIN CANCER, HX OF   . SLEEP APNEA   . Hypertension   . Hyperlipidemia   . Diabetes mellitus   . Hyperkalemia   . Arthritis   . Osteoarthritis   . Tubular adenoma of colon   . Secondary hyperparathyroidism   .  Automatic implantable cardioverter-defibrillator in situ   . Shortness of breath   . Pacemaker    Past Surgical History  Procedure Laterality Date  . Colectomy    . Prostatectomy    . Penile prosthesis placement    . Removed prosthetic eye    . Ptca      stent placed  . Total knee arthroplasty  2008    right  . St jude unify      St. Jude single chamber defibrillator 07/19/09 (Dr. Graciela Husbands)  . Pacemaker insertion  2010  . Av fistula placement  03/30/2012    Procedure: ARTERIOVENOUS (AV) FISTULA CREATION;  Surgeon: Chuck Hint, MD;  Location: [facility] OR;  Service: Vascular;  Laterality: Right;  Creation of BrachioCephalic Fistula Right arm  . Cardiac catheterization    . Coronary angioplasty    . Eye surgery      left eye prothesis  . Fracture surgery      collar bone, right knee replacement  . Insert / replace / remove pacemaker     Family History  Problem Relation Age of Onset  . Heart disease Father   . Throat cancer Father   . Throat cancer Mother   . Heart disease Mother   . Hypertension Mother    History  Substance Use Topics  . Smoking status: Former Smoker    Types: Cigarettes    Quit  date: 09/22/1966  . Smokeless tobacco: Never Used  . Alcohol Use: 0 - .5 oz/week    0-1 drink(s) per week     Comment: occasional    Review of Systems  Constitutional: Negative for fever, chills and activity change.  Eyes: Negative for visual disturbance.  Respiratory: Positive for cough and shortness of breath. Negative for chest tightness.   Cardiovascular: Negative for chest pain.  Gastrointestinal: Negative for blood in stool and abdominal distention.  Genitourinary: Negative for dysuria, enuresis and difficulty urinating.  Musculoskeletal: Negative for arthralgias and neck pain.  Skin: Negative for rash.  Neurological: Negative for dizziness, light-headedness and headaches.  Hematological: Bruises/bleeds easily.  Psychiatric/Behavioral: Negative for confusion.     Allergies  Prednisone  Home Medications   Current Outpatient Rx  Name  Route  Sig  Dispense  Refill  . aspirin 81 MG chewable tablet   Oral   Chew 81 mg by mouth daily.         . digoxin (LANOXIN) 0.125 MG tablet   Oral   Take 0.125 mg by mouth daily.         . furosemide (LASIX) 80 MG tablet      TAKE 1 TABLET BY MOUTH EVERY DAY   90 tablet   3   . hydrALAZINE (APRESOLINE) 25 MG tablet   Oral   Take 25 mg by mouth 3 (three) times daily.         . insulin lispro protamine-lispro (HUMALOG 75/25) (75-25) 100 UNIT/ML SUSP injection   Subcutaneous   Inject 45 Units into the skin 2 (two) times daily with a meal.         . isosorbide mononitrate (IMDUR) 30 MG 24 hr tablet   Oral   Take 0.5 tablets (15 mg total) by mouth daily.   90 tablet   3   . lisinopril (PRINIVIL,ZESTRIL) 20 MG tablet   Oral   Take 10 mg by mouth daily.         . meclizine (ANTIVERT) 25 MG tablet   Oral   Take 1 tablet (25 mg total) by mouth 3 (three) times daily as needed for dizziness or nausea.   30 tablet   0   . metoprolol succinate (TOPROL-XL) 100 MG 24 hr tablet   Oral   Take 100 mg by mouth 2 (two) times daily. Take with or immediately following a meal.         . potassium chloride SA (K-DUR,KLOR-CON) 20 MEQ tablet   Oral   Take 20 mEq by mouth daily.          [name] warfarin (COUMADIN) 5 MG tablet   Oral   Take 2.5-5 mg by mouth daily. Monday, Wednesday, Friday, takes 2.5 mg; Tuesday, Thursday, Saturday, Sunday takes 5 mg         . glucose blood (FREESTYLE TEST STRIPS) test strip   Other   1 each by Other route as needed. Use as instructed          . nitroGLYCERIN (NITROSTAT) 0.4 MG SL tablet   Sublingual   Place 0.4 mg under the tongue every 5 (five) minutes x 3 doses as needed for chest pain.          BP 171/79  Pulse 76  Temp(Src) 98 F (36.7 C) (Oral)  Resp 16  SpO2 97% Physical Exam  Nursing note and vitals reviewed. Constitutional: He is  oriented to person, place, and time. He appears well-developed.  HENT:  Head:  Normocephalic and atraumatic.  Eyes: Conjunctivae and EOM are normal. Pupils are equal, round, and reactive to light.  Neck: Normal range of motion. Neck supple. JVD present.  Distended neck veins  Cardiovascular: Normal rate and regular rhythm.   Murmur heard. Pulmonary/Chest: Effort normal and breath sounds normal. No respiratory distress.  Bibasilar crackles  Abdominal: Soft. Bowel sounds are normal. He exhibits no distension. There is no tenderness. There is no rebound and no guarding.  Musculoskeletal: He exhibits no edema.  Neurological: He is alert and oriented to person, place, and time.  Skin: Skin is warm.    ED Course  Procedures (including critical care time) Labs Review Labs Reviewed  CBC WITH DIFFERENTIAL - Abnormal; Notable for the following:    Platelets 123 (*)    All other components within normal limits  COMPREHENSIVE METABOLIC PANEL - Abnormal; Notable for the following:    BUN 49 (*)    Creatinine, Ser 3.30 (*)    Alkaline Phosphatase 37 (*)    GFR calc non Af Amer 16 (*)    GFR calc Af Amer 19 (*)    All other components within normal limits  PRO B NATRIURETIC PEPTIDE - Abnormal; Notable for the following:    Pro B Natriuretic peptide (BNP) 10776.0 (*)    All other components within normal limits  APTT - Abnormal; Notable for the following:    aPTT 62 (*)    All other components within normal limits  URINALYSIS, ROUTINE W REFLEX MICROSCOPIC - Abnormal; Notable for the following:    Hgb urine dipstick TRACE (*)    Protein, ur >300 (*)    All other components within normal limits  PROTIME-INR - Abnormal; Notable for the following:    Prothrombin Time 29.2 (*)    INR 2.89 (*)    All other components within normal limits  URINE MICROSCOPIC-ADD ON - Abnormal; Notable for the following:    Casts HYALINE CASTS (*)    All other components within normal limits  TROPONIN I   DIGOXIN LEVEL   Imaging Review Dg Chest 2 View  09/10/2013   CLINICAL DATA:  Shortness of breath  EXAM: CHEST  2 VIEW  COMPARISON:  04/30/2013  FINDINGS: Unchanged orientation of biventricular ICD/ pacer from the left. Stable cardiomegaly.  Hyperinflated lungs. There is diffuse interstitial coarsening and fissural thickening. There may be early Kerley B-lines, especially in the lateral projection. No significant effusion. No pneumothorax. Remote left clavicle fracture.  IMPRESSION: Pulmonary venous congestion, bordering on interstitial edema.   Electronically Signed   By: Tiburcio Pea M.D.   On: 09/10/2013 03:51    EKG Interpretation   None       Date: 09/10/2013  Rate: 78  Rhythm: normal sinus rhythm and premature ventricular contractions (PVC)  QRS Axis: normal  Intervals: normal  ST/T Wave abnormalities: nonspecific ST/T changes  Conduction Disutrbances:none  Narrative Interpretation:   Old EKG Reviewed: unchanged     MDM  No diagnosis found.  Differential diagnosis includes: ACS syndrome CHF exacerbation COPD exacebation Valvular disorder Pericarditis Pericardial effusion Pneumonia Pleural effusion Pulmonary edema PE Anemia  Pt comes in with cc of dib. He has advanced CHF with 20% EF. Hx suggestive of intermittent, sporadic dib - with no aggravating or relieving factors. Exam shows bibasilar crackles, and some JVD. Unlikely PE, no risk factors, no signs of DVT and he is on coumadin - making it less likely, No chest pain - trop is neg, ekg shows no acute findings.  Pt has BNP elevation. CXR shows pulm congestions. Likely CHF exacerbation, may be some pulm HTN in addition.\ Iv lasix given - 80 mg.  We have spoken with Hospitalist - Dr. Allena Katz admitting. Adolph Pollack consulted as well- since patient has advanced CHF hx/       Study Conclusions from 2014 echo  - Left ventricle: The cavity size was normal. Wall thickness was increased in a pattern of mild  LVH. The estimated ejection fraction was 20%. Diffuse hypokinesis. Doppler parameters are consistent with probable restrictive physiology, indicative of decreased left ventricular diastolic compliance and/or increased left atrial pressure. - Aortic valve: Mild regurgitation. - Mitral valve: Mild regurgitation. - Left atrium: The atrium was mildly dilated. - Right ventricle: Systolic function was mildly reduced. - Right atrium: The atrium was mildly dilated. - Pulmonary arteries: PA peak pressure: 49mm Hg (S).    Derwood Kaplan, MD 09/10/13 1610  Derwood Kaplan, MD 09/10/13 [phone]

## 2013-09-11 DIAGNOSIS — E119 Type 2 diabetes mellitus without complications: Secondary | ICD-10-CM

## 2013-09-11 DIAGNOSIS — I359 Nonrheumatic aortic valve disorder, unspecified: Secondary | ICD-10-CM

## 2013-09-11 LAB — GLUCOSE, CAPILLARY: Glucose-Capillary: 185 mg/dL — ABNORMAL HIGH (ref 70–99)

## 2013-09-11 LAB — BASIC METABOLIC PANEL
BUN: 51 mg/dL — ABNORMAL HIGH (ref 6–23)
Calcium: 9.2 mg/dL (ref 8.4–10.5)
Creatinine, Ser: 3.42 mg/dL — ABNORMAL HIGH (ref 0.50–1.35)
GFR calc Af Amer: 18 mL/min — ABNORMAL LOW (ref >90)
GFR calc non Af Amer: 16 mL/min — ABNORMAL LOW (ref >90)
Potassium: 3.4 mEq/L — ABNORMAL LOW (ref 3.5–5.1)
Sodium: 142 mEq/L (ref 135–145)

## 2013-09-11 LAB — TROPONIN I: Troponin I: <0.3 ng/mL (ref <0.30)

## 2013-09-11 MED ORDER — WARFARIN - PHARMACIST DOSING INPATIENT: Freq: Every day | Status: DC

## 2013-09-11 MED ORDER — FUROSEMIDE 80 MG PO TABS
80.0000 mg | ORAL_TABLET | Freq: Two times a day (BID) | ORAL | Status: DC
  Administered 2013-09-11 – 2013-09-13 (×4): 80 mg via ORAL
  Filled 2013-09-11 (×6): qty 1

## 2013-09-11 MED ORDER — WARFARIN SODIUM 5 MG PO TABS
5.0000 mg | ORAL_TABLET | Freq: Once | ORAL | Status: AC
  Administered 2013-09-11: 5 mg via ORAL
  Filled 2013-09-11 (×2): qty 1

## 2013-09-11 NOTE — Evaluation (Signed)
Physical Therapy Evaluation Patient Details Name: [name] [name]. MRN: [id number] DOB: 11-20-1930 Today's Date: 09/11/2013 Time: 4098-1191 PT Time Calculation (min): 10 min  PT Assessment / Plan / Recommendation History of Present Illness  Pt is a 79 y.o. male adm from home with history of chronic systolic CHF with EF of 20%, CAD, a fib. Presented to ED with SOB.   Clinical Impression  Pt adm due to the above. Upon evaluation pt was at baseline level for mobility. Able to ambulate unit and up flight of steps with good safety awareness and balance. No acute PT needs at this time. Will sign off.     PT Assessment  Patent does not need any further PT services    Follow Up Recommendations  No PT follow up;Supervision - Intermittent    Does the patient have the potential to tolerate intense rehabilitation      Barriers to Discharge        Equipment Recommendations  None recommended by PT    Recommendations for Other Services     Frequency      Precautions / Restrictions Precautions Precautions: None Restrictions Weight Bearing Restrictions: No   Pertinent Vitals/Pain No complaints       Mobility  Bed Mobility Bed Mobility: Supine to Sit;Sitting - Scoot to Edge of Bed;Sit to Supine Supine to Sit: 7: Independent Sitting - Scoot to Edge of Bed: 7: Independent Sit to Supine: 7: Independent Details for Bed Mobility Assistance: demo good technique  Transfers Transfers: Sit to Stand;Stand to Sit Sit to Stand: 7: Independent;From bed Stand to Sit: 7: Independent;To bed Details for Transfer Assistance: pt demo good technique; no phsical (A) needed; no LOB  Ambulation/Gait Ambulation/Gait Assistance: 6: Modified independent (Device/Increase time) Ambulation Distance (Feet): 200 Feet Assistive device: None Ambulation/Gait Assistance Details: pt demo good technique and stability with gt; no LOB noted; challenged with high level balance activities and demo good technique   Gait Pattern: Within Functional Limits Gait velocity: steady gt speed Stairs: Yes Stairs Assistance: 6: Modified independent (Device/Increase time) Stair Management Technique: One rail Left;Alternating pattern;Forwards Number of Stairs: 14         PT Diagnosis:    PT Problem List:   PT Treatment Interventions:       PT Goals(Current goals can be found in the care plan section) Acute Rehab PT Goals Patient Stated Goal: to figure out what's going on PT Goal Formulation: No goals set, d/c therapy  Visit Information  Last PT Received On: 09/11/13 Assistance Needed: +1 History of Present Illness: Pt is a 79 y.o. male adm from home        Prior Functioning  Home Living Family/patient expects to be discharged to:: Private residence Living Arrangements: Spouse/significant other Available Help at Discharge: Family;Available 24 hours/day Type of Home: House Home Access: Stairs to enter [employer] of Steps: 1 Entrance Stairs-Rails: None Home Layout: Two level;Bed/bath upstairs Alternate Level Stairs-Number of Steps: 6 x2 (with landing between) Alternate Level Stairs-Rails: Left (going up) Home Equipment: Walker - 2 wheels;Shower seat;Cane - single point Prior Function Level of Independence: Independent Comments: pt drives and is independent with all ADLs Communication Communication: No difficulties Dominant Hand: Right    Cognition  Cognition Arousal/Alertness: Awake/alert Behavior During Therapy: WFL for tasks assessed/performed Overall Cognitive Status: Within Functional Limits for tasks assessed    Extremity/Trunk Assessment Upper Extremity Assessment Upper Extremity Assessment: Overall WFL for tasks assessed Lower Extremity Assessment Lower Extremity Assessment: Overall WFL for tasks assessed Cervical /  Trunk Assessment Cervical / Trunk Assessment: Normal   Balance Balance Balance Assessed: Yes Static Standing Balance Static Standing - Balance  Support: No upper extremity supported;During functional activity Static Standing - Level of Assistance: 7: Independent High Level Balance High Level Balance Activites: Direction changes;Head turns High Level Balance Comments: no LOB noted  End of Session PT - End of Session Equipment Utilized During Treatment: Gait belt Activity Tolerance: Patient tolerated treatment well Patient left: in bed;with call bell/phone within reach Nurse Communication: Mobility status  GP     Donell Sievert, [place] [phone] 09/11/2013, 8:52 AM

## 2013-09-11 NOTE — Progress Notes (Signed)
ANTICOAGULATION CONSULT NOTE - Initial Consult  Pharmacy Consult for Coumadin Indication: atrial fibrillation  Allergies  Allergen Reactions  . Prednisone Other (See Comments)    Raised blood sugar    Patient Measurements: Height: 6' (182.9 cm) Weight: 191 lb 2.2 oz (86.7 kg) ( standing scale and bed wt) IBW/kg (Calculated) : 77.6  Vital Signs: Temp: 98.1 F (36.7 C) (12/21 0557) Temp src: Oral (12/21 0557) BP: 162/94 mmHg (12/21 0557) Pulse Rate: 80 (12/21 0557)  Labs:  Recent Labs  09/10/13 0315 09/10/13 1200 09/10/13 1730 09/10/13 2340 09/11/13 0420 09/11/13 1107  HGB 14.5  --   --   --   --   --   HCT 44.5  --   --   --   --   --   PLT 123*  --   --   --   --   --   APTT 62*  --   --   --   --   --   LABPROT 29.2*  --   --   --   --  19.0*  INR 2.89*  --   --   --   --  1.64*  CREATININE 3.30*  --   --   --  3.42*  --   TROPONINI <0.30 <0.30 <0.30 <0.30  --   --     Estimated Creatinine Clearance: 18.9 ml/min (by C-G formula based on Cr of 3.42).   Medical History: Past Medical History  Diagnosis Date  . Chronic atrial fibrillation     a. Historically difficult rate control. b. s/p CRT-D implantation with anticipation of AV junction ablation but patient then was able to accomplish rate control and ablation not undertaken.  [name] CARDIOMYOPATHY, PRIMARY, DILATED     a. Mixed ICM/NICM - EF 15% by echo 05/2009; St. Jude CRT-D implanted 06/2009. b. Echo 04/2013 - EF 20%, mild LVH.  [name] COLON CANCER, HX OF     a. s/p partial colectomy.  . Chronic systolic CHF (congestive heart failure)   . CORONARY ARTERY DISEASE     a. BMS to LAD 04/2008. b. Cath 06/2009: nonobstructive disease.  [name] PROSTATE CANCER, HX OF     a. s/p radical prostatectomy.  [name] SKIN CANCER, HX OF   . SLEEP APNEA   . Hypertension   . Hyperlipidemia   . Diabetes mellitus   . Hyperkalemia   . Arthritis   . Osteoarthritis   . Tubular adenoma of colon   . Secondary hyperparathyroidism   . Automatic  implantable cardioverter-defibrillator in situ   . CKD (chronic kidney disease), stage IV     a. Right brachiocephalic AV fistula 03/2012. Neph = [place].  . Vertigo   . Valvular heart disease     a. Echo 04/2013: mild AI, mild MR.  . Pulmonary HTN     a. PA pressure by echo 05/01/13.    Assessment: 79 year old male with a history of CHF (EF=20%), CAD, and atrial fibrillation presented to the ED with SOB.  On Coumadin PTA with therapeutic INR on admission.  INR down to 1.6 overnight  Goal of Therapy:  INR 2-3 Monitor platelets by anticoagulation protocol: Yes   Plan:  1) Coumadin 5 mg po x 1 dose  2) Daily INR  Celedonio Miyamoto, PharmD, BCPS Clinical Pharmacist Pager [phone]   09/11/2013,1:47 PM

## 2013-09-11 NOTE — Progress Notes (Signed)
Utilization Review Completed.Tayron Hunnell T12/21/2014  

## 2013-09-11 NOTE — Assessment & Plan Note (Signed)
Clinically doing well Continue same meds 

## 2013-09-11 NOTE — Assessment & Plan Note (Signed)
Has seen dr. Graciela Husbands To discuss with dr. Ladona Ridgel regarding avnodal ablation

## 2013-09-11 NOTE — Progress Notes (Signed)
  Echocardiogram 2D Echocardiogram has been performed.  [place] 09/11/2013, 9:54 AM

## 2013-09-11 NOTE — Progress Notes (Signed)
[facility] PROGRESS NOTE  Filed Weights   09/10/13 0730 09/11/13 0557  Weight: 90.3 kg (199 lb 1.2 oz) 86.7 kg (191 lb 2.2 oz)        Intake/Output Summary (Last 24 hours) at 09/11/13 1123 Last data filed at 09/10/13 1700  Gross per 24 hour  Intake    480 ml  Output    400 ml  Net     80 ml     Assessment/Plan: 1-Systolic and diastolic CHF, acute on chronic: patient last EF in the 20% 04/2013. -CE'z negative -s/p pacemaker/defibrillator -has lost 8 pounds overnight -Cr slightly elevated -no JVD, no crackles -will switch to lasix 80mg  PO BID -per cardiology rec's will benefit of nodal ablation -will repeat BNP in am -patient educated regarding low sodium diet -no ACE or ARB due to renal failure  2-Chronic renal insufficiency, stage IV (severe): Cr slightly elevated with diuresis. -will monitor closely -lasix changed to PO  3-BIVentricular Defibrillator--St Jude: interrogated and working properly only 58% of the time. -per cardiology/EP will benefit of nodal ablation -meanwhile continue digoxin and metoprolol   4-HYPERTENSION: stable. Will continue current regimen  5-HYPERLIPIDEMIA: continue low fat diet. Not on statins at home.  6-DIABETES MELLITUS, TYPE II: continue NPH and SSI  7-CORONARY ARTERY DISEASE: no CP -troponin neg -will continue ASA, imdur and b-blocker  8-Chronic atrial fibrillation: continue digoxin, metoprolol and coumadin.     Code Status: Full Family Communication: no family at bedisde Disposition Plan: home when medically stable    Consultants:  Cardiology   Procedures: ECHO: pending   Antibiotics:  None   HPI/Subjective: Afebrile, no CP and reporting he is breathing significantly better.  Objective: Filed Vitals:   09/10/13 0730 09/10/13 1345 09/10/13 2027 09/11/13 0557  BP: 161/77 160/72 163/71 162/94  Pulse: 67 74 79 80  Temp: 97.2 F (36.2 C) 97.7 F (36.5 C) 98.9 F (37.2 C) 98.1 F (36.7 C)  TempSrc: Oral  Oral Oral Oral  Resp: 18 18 18 18   Height: 6' (1.829 m)     Weight: 90.3 kg (199 lb 1.2 oz)   86.7 kg (191 lb 2.2 oz)  SpO2: 97% 97% 93% 94%     Exam:  General: Alert, awake, oriented x3, in no acute distress. Denies CP and breathing significantly improved. Able to speak in full sentences HEENT: No bruits, no goiter. No JVD Heart: irregular, no rubs or gallops. Le edema 1-2+ bilaterally Lungs: Good air movement bilateral; no crackles and no wheezing  Abdomen: Soft, nontender, nondistended, positive bowel sounds.  Neuro: Grossly intact, nonfocal.   Data Reviewed: Basic Metabolic Panel:  Recent Labs Lab 09/10/13 0315 09/11/13 0420  NA 142 142  K 4.1 3.4*  CL 103 100  CO2 26 30  GLUCOSE 90 90  BUN 49* 51*  CREATININE 3.30* 3.42*  CALCIUM 9.4 9.2   Liver Function Tests:  Recent Labs Lab 09/10/13 0315  AST 23  ALT 16  ALKPHOS 37*  BILITOT 0.8  PROT 7.2  ALBUMIN 3.8   CBC:  Recent Labs Lab 09/10/13 0315  WBC 8.0  NEUTROABS 5.4  HGB 14.5  HCT 44.5  MCV 95.1  PLT 123*   Cardiac Enzymes:  Recent Labs Lab 09/10/13 0315 09/10/13 1200 09/10/13 1730 09/10/13 2340  TROPONINI <0.30 <0.30 <0.30 <0.30   BNP (last 3 results)  Recent Labs  09/10/13 0315  PROBNP 10776.0*   CBG:  Recent Labs Lab 09/10/13 1208 09/10/13 1612 09/10/13 2122 09/11/13 0556  GLUCAP 169* 141*  75 102*    Studies: Dg Chest 2 View  09/10/2013   CLINICAL DATA:  Shortness of breath  EXAM: CHEST  2 VIEW  COMPARISON:  04/30/2013  FINDINGS: Unchanged orientation of biventricular ICD/ pacer from the left. Stable cardiomegaly.  Hyperinflated lungs. There is diffuse interstitial coarsening and fissural thickening. There may be early Kerley B-lines, especially in the lateral projection. No significant effusion. No pneumothorax. Remote left clavicle fracture.  IMPRESSION: Pulmonary venous congestion, bordering on interstitial edema.   Electronically Signed   By: Tiburcio Pea M.D.    On: 09/10/2013 03:51    Scheduled Meds: . aspirin  81 mg Oral Daily  . digoxin  0.125 mg Oral QODAY  . furosemide  80 mg Oral BID  . hydrALAZINE  25 mg Oral TID  . insulin aspart  0-5 Units Subcutaneous QHS  . insulin aspart  0-9 Units Subcutaneous TID WC  . insulin aspart protamine- aspart  45 Units Subcutaneous BID WC  . isosorbide mononitrate  15 mg Oral Daily  . metoprolol succinate  100 mg Oral BID  . potassium chloride SA  20 mEq Oral Daily  . sodium chloride  3 mL Intravenous Q12H   Continuous Infusions:   Time spent: > 30 minutes  Tayvion Lauder  [facility] Pager [phone]. If 8PM-8AM, please contact night-coverage at [email or web address], password [facility] 09/11/2013, 11:23 AM  LOS: 1 day

## 2013-09-11 NOTE — Progress Notes (Signed)
Pharmacist Heart Failure Core Measure Documentation  Assessment: [name] [name]. has an EF documented as 20% on 05/01/13 by ECHO.  Rationale: Heart failure patients with left ventricular systolic dysfunction (LVSD) and an EF < 40% should be prescribed an angiotensin converting enzyme inhibitor (ACEI) or angiotensin receptor blocker (ARB) at discharge unless a contraindication is documented in the medical record.  This patient is not currently on an ACEI or ARB for HF.  This note is being placed in the record in order to provide documentation that a contraindication to the use of these agents is present for this encounter.  ACE Inhibitor or Angiotensin Receptor Blocker is contraindicated (specify all that apply)  []   ACEI allergy AND ARB allergy []   Angioedema []   Moderate or severe aortic stenosis []   Hyperkalemia []   Hypotension []   Renal artery stenosis [x]   Worsening renal function, preexisting renal disease or dysfunction   [name] [name] 09/11/2013 10:26 AM

## 2013-09-11 NOTE — Assessment & Plan Note (Signed)
Has regular f/u with nephrology 

## 2013-09-11 NOTE — Progress Notes (Signed)
Patient ID: [name] Bublitz., male   DOB: 10-08-1930, 79 y.o.   MRN: [id number] Subjective:  Dyspnea much improved. Diuresed 8 lbs  Objective:  Vital Signs in the last 24 hours: Temp:  [97.7 F (36.5 C)-98.9 F (37.2 C)] 98.1 F (36.7 C) (12/21 0557) Pulse Rate:  [74-80] 80 (12/21 0557) Resp:  [18] 18 (12/21 0557) BP: (160-163)/(71-94) 162/94 mmHg (12/21 0557) SpO2:  [93 %-97 %] 94 % (12/21 0557) Weight:  [191 lb 2.2 oz (86.7 kg)] 191 lb 2.2 oz (86.7 kg) (12/21 0557)  Intake/Output from previous day: 12/20 0701 - 12/21 0700 In: 840 [P.O.:840] Out: 1100 [Urine:1100] Intake/Output from this shift:    Physical Exam: Well appearing NAD HEENT: Unremarkable Neck:  6 cm JVD, no thyromegally Back:  No CVA tenderness Lungs:  Clear with no wheezes, minimal basilar rales HEART:  IRegular rate rhythm, no murmurs, no rubs, no clicks Abd:  Flat, positive bowel sounds, no organomegally, no rebound, no guarding Ext:  2 plus pulses, no edema, no cyanosis, no clubbing Skin:  No rashes no nodules Neuro:  CN II through XII intact, motor grossly intact  Lab Results:  Recent Labs  09/10/13 0315  WBC 8.0  HGB 14.5  PLT 123*    Recent Labs  09/10/13 0315 09/11/13 0420  NA 142 142  K 4.1 3.4*  CL 103 100  CO2 26 30  GLUCOSE 90 90  BUN 49* 51*  CREATININE 3.30* 3.42*    Recent Labs  09/10/13 1730 09/10/13 2340  TROPONINI <0.30 <0.30   Hepatic Function Panel  Recent Labs  09/10/13 0315  PROT 7.2  ALBUMIN 3.8  AST 23  ALT 16  ALKPHOS 37*  BILITOT 0.8   No results found for this basename: CHOL,  in the last 72 hours No results found for this basename: PROTIME,  in the last 72 hours  Imaging: Dg Chest 2 View  09/10/2013   CLINICAL DATA:  Shortness of breath  EXAM: CHEST  2 VIEW  COMPARISON:  04/30/2013  FINDINGS: Unchanged orientation of biventricular ICD/ pacer from the left. Stable cardiomegaly.  Hyperinflated lungs. There is diffuse interstitial coarsening and  fissural thickening. There may be early Kerley B-lines, especially in the lateral projection. No significant effusion. No pneumothorax. Remote left clavicle fracture.  IMPRESSION: Pulmonary venous congestion, bordering on interstitial edema.   Electronically Signed   By: Tiburcio Pea M.D.   On: 09/10/2013 03:51    Cardiac Studies: Tele - atrial fib with a rvr Assessment/Plan:  1. Acute on chronic systolic CHF 2. Atrial fib with a RVR 3. S/p BiV ICD implant remotely with interogation demonstrating normal device function but BiV pacing only 58% of the time. Rec: he is near euvolemic. I have recommended proceeding with AV node ablation but he is unclear as to whether he would like to proceed. He could be discharged home today, though I think an extra day of diuresis is probably better with regard to preventing additional admits. I will have him speak to Dr. Excell Seltzer who is his primary cardiologist tomorrow if he is still in the hospital. If he goes home today, he will need to followup with Dr.Klein sooner than later in the next 2 weeks.  LOS: 1 day    Gregg Taylor,M.D. 09/11/2013, 10:57 AM

## 2013-09-11 NOTE — Assessment & Plan Note (Signed)
Discussed Will not change meds at this time but will keep a close eye on it for now. Review in 3-4 months

## 2013-09-12 ENCOUNTER — Encounter (HOSPITAL_COMMUNITY)
Admission: EM | Disposition: A | Discharge status: Home / Self Care | Payer: Self-pay | Source: Home / Self Care | Attending: Internal Medicine

## 2013-09-12 DIAGNOSIS — I4891 Unspecified atrial fibrillation: Secondary | ICD-10-CM

## 2013-09-12 HISTORY — PX: AV NODE ABLATION: SHX5458

## 2013-09-12 LAB — BASIC METABOLIC PANEL
BUN: 50 mg/dL — ABNORMAL HIGH (ref 6–23)
CO2: 28 mEq/L (ref 19–32)
Calcium: 9.3 mg/dL (ref 8.4–10.5)
Chloride: 101 mEq/L (ref 96–112)
Potassium: 2.8 mEq/L — ABNORMAL LOW (ref 3.5–5.1)
Sodium: 142 mEq/L (ref 135–145)

## 2013-09-12 LAB — POCT ACTIVATED CLOTTING TIME
Activated Clotting Time: 193 seconds
Activated Clotting Time: 182 seconds

## 2013-09-12 LAB — PROTIME-INR
INR: 1.61 — ABNORMAL HIGH (ref 0.00–1.49)
Prothrombin Time: 18.7 seconds — ABNORMAL HIGH (ref 11.6–15.2)

## 2013-09-12 LAB — GLUCOSE, CAPILLARY
Glucose-Capillary: 80 mg/dL (ref 70–99)
Glucose-Capillary: 146 mg/dL — ABNORMAL HIGH (ref 70–99)
Glucose-Capillary: 129 mg/dL — ABNORMAL HIGH (ref 70–99)
Glucose-Capillary: 156 mg/dL — ABNORMAL HIGH (ref 70–99)

## 2013-09-12 LAB — MAGNESIUM: Magnesium: 2.1 mg/dL (ref 1.5–2.5)

## 2013-09-12 SURGERY — AV NODE ABLATION
Anesthesia: LOCAL

## 2013-09-12 MED ORDER — METOPROLOL SUCCINATE ER 100 MG PO TB24
100.0000 mg | ORAL_TABLET | Freq: Every day | ORAL | Status: DC
  Administered 2013-09-13: 100 mg via ORAL
  Filled 2013-09-12: qty 1

## 2013-09-12 MED ORDER — SODIUM CHLORIDE 0.9 % IJ SOLN: 3.0000 mL | INTRAMUSCULAR | Status: DC | PRN

## 2013-09-12 MED ORDER — MIDAZOLAM HCL 5 MG/5ML IJ SOLN
INTRAMUSCULAR | Status: AC
  Filled 2013-09-12: qty 5

## 2013-09-12 MED ORDER — POTASSIUM CHLORIDE CRYS ER 20 MEQ PO TBCR
40.0000 meq | EXTENDED_RELEASE_TABLET | Freq: Every day | ORAL | Status: DC
  Administered 2013-09-13: 40 meq via ORAL
  Filled 2013-09-12: qty 2

## 2013-09-12 MED ORDER — BUPIVACAINE HCL (PF) 0.25 % IJ SOLN
INTRAMUSCULAR | Status: AC
  Filled 2013-09-12: qty 30

## 2013-09-12 MED ORDER — SODIUM CHLORIDE 0.9 % IJ SOLN
3.0000 mL | Freq: Two times a day (BID) | INTRAMUSCULAR | Status: DC
  Administered 2013-09-12 – 2013-09-13 (×2): 3 mL via INTRAVENOUS

## 2013-09-12 MED ORDER — GLUCOSE BLOOD VI STRP: 1.0000 | ORAL_STRIP | Status: DC | PRN

## 2013-09-12 MED ORDER — WARFARIN SODIUM 5 MG PO TABS
5.0000 mg | ORAL_TABLET | Freq: Once | ORAL | Status: AC
  Administered 2013-09-12: 5 mg via ORAL
  Filled 2013-09-12: qty 1

## 2013-09-12 MED ORDER — FENTANYL CITRATE 0.05 MG/ML IJ SOLN
INTRAMUSCULAR | Status: AC
  Filled 2013-09-12: qty 2

## 2013-09-12 MED ORDER — POTASSIUM CHLORIDE CRYS ER 20 MEQ PO TBCR
40.0000 meq | EXTENDED_RELEASE_TABLET | Freq: Two times a day (BID) | ORAL | Status: AC
  Administered 2013-09-12: 40 meq via ORAL
  Filled 2013-09-12: qty 2

## 2013-09-12 MED ORDER — POTASSIUM CHLORIDE CRYS ER 20 MEQ PO TBCR
40.0000 meq | EXTENDED_RELEASE_TABLET | Freq: Every day | ORAL | Status: DC
  Administered 2013-09-12: 40 meq via ORAL
  Filled 2013-09-12: qty 2

## 2013-09-12 MED ORDER — WARFARIN SODIUM 2.5 MG PO TABS: 2.5000 mg | ORAL_TABLET | Freq: Every day | ORAL | Status: DC

## 2013-09-12 MED ORDER — ACETAMINOPHEN 325 MG PO TABS: 650.0000 mg | ORAL_TABLET | ORAL | Status: DC | PRN

## 2013-09-12 MED ORDER — SODIUM CHLORIDE 0.9 % IV SOLN: 250.0000 mL | INTRAVENOUS | Status: DC | PRN

## 2013-09-12 MED ORDER — ONDANSETRON HCL 4 MG/2ML IJ SOLN: 4.0000 mg | Freq: Four times a day (QID) | INTRAMUSCULAR | Status: DC | PRN

## 2013-09-12 NOTE — Progress Notes (Signed)
[facility] PROGRESS NOTE  Filed Weights   09/10/13 0730 09/11/13 0557 09/12/13 0603  Weight: 90.3 kg (199 lb 1.2 oz) 86.7 kg (191 lb 2.2 oz) 86.3 kg (190 lb 4.1 oz)        Intake/Output Summary (Last 24 hours) at 09/12/13 1523 Last data filed at 09/12/13 0900  Gross per 24 hour  Intake    240 ml  Output      0 ml  Net    240 ml     Assessment/Plan: 1-Systolic and diastolic CHF, acute on chronic: patient last EF in the 20% 04/2013. -CE'z negative -s/p pacemaker/defibrillator -has lost 9 pounds total -Cr was slightly elevated from baseline initially with IV lasix; now trending down and essentially unchanged by GFR -no JVD, no crackles and just trace edema -will continue lasix 80mg  PO BID -per cardiology rec's will benefit of nodal ablation and the plan is to proceed with ablation today -BNP 6255 down from 10,000 -patient educated regarding low sodium diet (less than 2 grams daily) -no ACE or ARB due to renal failure  2-Chronic renal insufficiency, stage IV (severe): Cr was slightly elevated with diuresis at the begining. -will monitor closely -lasix changed to PO -today (12/22) Cr 3.29  3-BIVentricular Defibrillator--St Jude: interrogated and working properly only 58% of the time. -per cardiology/EP will benefit of nodal ablation -meanwhile continue digoxin and metoprolol as indicated by cardiology -will follow rec's  4-HYPERTENSION: stable. Will continue current regimen  5-HYPERLIPIDEMIA: continue low fat diet. Not on statins at home.  6-DIABETES MELLITUS, TYPE II: continue NPH and SSI  7-CORONARY ARTERY DISEASE: no CP -troponin neg -will continue ASA, imdur and b-blocker  8-Chronic atrial fibrillation: continue digoxin, metoprolol and coumadin. -plan is for nodal ablation to create complete heart block and use 100% his pacemaker  DVT:on coumadin   Code Status: Full Family Communication: no family at bedisde Disposition Plan: home when medically stable     Consultants:  Cardiology   Procedures: ECHO: pending   Antibiotics:  None   HPI/Subjective: Afebrile, no CP and reporting he is breathing better. Still with activity experienced some dyspnea   Objective: Filed Vitals:   09/11/13 0557 09/11/13 1330 09/11/13 2049 09/12/13 0603  BP: 162/94 126/67 151/81 155/67  Pulse: 80 70 72 74  Temp: 98.1 F (36.7 C) 98.3 F (36.8 C) 99 F (37.2 C) 98.2 F (36.8 C)  TempSrc: Oral Oral Oral Oral  Resp: 18 18 18 18   Height:      Weight: 86.7 kg (191 lb 2.2 oz)   86.3 kg (190 lb 4.1 oz)  SpO2: 94% 95% 92% 94%     Exam:  General: Alert, awake, oriented x3, in no acute distress. Denies CP and breathing significantly improved. Able to speak in full sentences HEENT: No bruits, no goiter. No JVD Heart: irregular, no rubs or gallops. Le edema trace bilaterally Lungs: Good air movement bilateral; no crackles and no wheezing  Abdomen: Soft, nontender, nondistended, positive bowel sounds.  Neuro: Grossly intact, nonfocal.   Data Reviewed: Basic Metabolic Panel:  Recent Labs Lab 09/10/13 0315 09/11/13 0420 09/12/13 0450 09/12/13 0955  NA 142 142 142  --   K 4.1 3.4* 2.8*  --   CL 103 100 101  --   CO2 26 30 28   --   GLUCOSE 90 90 68*  --   BUN 49* 51* 50*  --   CREATININE 3.30* 3.42* 3.29*  --   CALCIUM 9.4 9.2 9.3  --  MG  --   --   --  2.1   Liver Function Tests:  Recent Labs Lab 09/10/13 0315  AST 23  ALT 16  ALKPHOS 37*  BILITOT 0.8  PROT 7.2  ALBUMIN 3.8   CBC:  Recent Labs Lab 09/10/13 0315  WBC 8.0  NEUTROABS 5.4  HGB 14.5  HCT 44.5  MCV 95.1  PLT 123*   Cardiac Enzymes:  Recent Labs Lab 09/10/13 0315 09/10/13 1200 09/10/13 1730 09/10/13 2340  TROPONINI <0.30 <0.30 <0.30 <0.30   BNP (last 3 results)  Recent Labs  09/10/13 0315 09/12/13 0450  PROBNP 10776.0* 6255.0*   CBG:  Recent Labs Lab 09/11/13 1607 09/11/13 2050 09/12/13 0603 09/12/13 1116 09/12/13 1433  GLUCAP 185*  125* 80 146* 129*    Studies: No results found.  Scheduled Meds: . [facility] HOLD] aspirin  81 mg Oral Daily  . [[facility] HOLD] digoxin  0.125 mg Oral QODAY  . [facility] HOLD] furosemide  80 mg Oral BID  . [facility] HOLD] hydrALAZINE  25 mg Oral TID  . [MAR HOLD] insulin aspart  0-5 Units Subcutaneous QHS  . [[facility] HOLD] insulin aspart  0-9 Units Subcutaneous TID WC  . [MAR HOLD] insulin aspart protamine- aspart  45 Units Subcutaneous BID WC  . [facility] HOLD] isosorbide mononitrate  15 mg Oral Daily  . [facility] HOLD] metoprolol succinate  100 mg Oral BID  . [MAR HOLD] potassium chloride SA  40 mEq Oral BID  . [facility] HOLD] potassium chloride  40 mEq Oral Daily  . [MAR HOLD] sodium chloride  3 mL Intravenous Q12H  . [facility] HOLD] warfarin  5 mg Oral ONCE-1800  . [facility] HOLD] Warfarin - Pharmacist Dosing Inpatient   Does not apply q1800   Continuous Infusions:   Time spent: > 30 minutes  Landri Dorsainvil  [facility] Pager [phone]. If 8PM-8AM, please contact night-coverage at [email or web address], password [facility] 09/12/2013, 3:23 PM  LOS: 2 days

## 2013-09-12 NOTE — Interval H&P Note (Signed)
History and Physical Interval Note:  09/12/2013 1:06 PM  [name] [name].  has presented today for surgery, with the diagnosis of a  The various methods of treatment have been discussed with the patient and family. After consideration of risks, benefits and other options for treatment, the patient has consented to  Procedure(s): AV NODE ABLATION (N/A) as a surgical intervention .  The patient's history has been reviewed, patient examined, no change in status, stable for surgery.  I have reviewed the patient's chart and labs.  Questions were answered to the patient's satisfaction.     Leonia Reeves.D.

## 2013-09-12 NOTE — Progress Notes (Signed)
Pt had 9 beat run of vtach.  Dr. Gwenlyn Perking was notified.  Will continue to monitor.

## 2013-09-12 NOTE — Progress Notes (Signed)
Patient refuses to use urinal. Can't get accurate output reading. Patient was educated and using the urinal was reinforced. Will continue to monitor.   Valinda Hoar RN

## 2013-09-12 NOTE — CV Procedure (Signed)
Electrophysiology procedure note  Procedure: AV node ablation with bundle of his mapping  Indication: Uncontrolled atrial fibrillation status post biventricular ICD implantation  Findings: After informed consent was obtained, the patient was taken to the diagnostic electrophysiology laboratory in the fasting state. After the usual preparation and draping, a 7 French quadripolar ablation catheter was inserted percutaneously into the right femoral vein and advanced under fluoroscopic guidance to the right ventricle, and his bundle region. Mapping of his bundle region was carried out demonstrating an HV interval of 64 ms. 7 RF energy applications were subsequently delivered to the His bundle region/AV node. During RF energy application, there is accelerated junctional rhythm and transient heart block. However persistent heart block could not be maintained. The right femoral artery was punctured and a 7 French quadripolar ablation catheter was inserted percutaneously and advanced under fluoroscopic guidance, into the left ventricle. The ablation catheter was maneuvered into the left ventricular outflow tract where a small his potential was seen. A single RF energy application was applied resulting in the creation of complete heart block. The patient was observed for approximately 10 minutes. During this time there is no recurrent AV node conduction. The pacing portion of his defibrillator was reprogrammed to a lower rate of 90 beats per minute. Tachycardia therapies were turned back on. The catheter was removed and the patient was returned to the recovery area for removal of his arterial and venous 8 [place] sheaths.  Complications: There were no immediate future complications  Conclusion: Successful catheter ablation of the AV node, resulting in the creation of complete heart block.  Lewayne Bunting, M.D.

## 2013-09-12 NOTE — Progress Notes (Signed)
Patient: [name] [name]. Date of Encounter: 09/12/2013, 8:47 AM Admit date: 09/10/2013     Subjective  [name] denies any new complaints. He denies CP, SOB or palpitations.   Objective  Physical Exam: Vitals: BP 155/67  Pulse 74  Temp(Src) 98.2 F (36.8 C) (Oral)  Resp 18  Ht 6' (1.829 m)  Wt 190 lb 4.1 oz (86.3 kg)  BMI 25.80 kg/m2  SpO2 94% General: Well developed, well appearing 79 year old male in no acute distress. Neck: Supple. JVD not elevated. Lungs: Clear bilaterally to auscultation without wheezes, rales, or rhonchi. Breathing is unlabored. Heart: Irregular S1 S2 without murmur, rub or gallop.  Abdomen: Soft, non-distended. Extremities: No clubbing or cyanosis. No edema.  Distal pedal pulses are 2+ and equal bilaterally. Neuro: Alert and oriented X 3. Moves all extremities spontaneously. No focal deficits.  Intake/Output:  Intake/Output Summary (Last 24 hours) at 09/12/13 0847 Last data filed at 09/11/13 1300  Gross per 24 hour  Intake    240 ml  Output      0 ml  Net    240 ml    Inpatient Medications:  . aspirin  81 mg Oral Daily  . digoxin  0.125 mg Oral QODAY  . furosemide  80 mg Oral BID  . hydrALAZINE  25 mg Oral TID  . insulin aspart  0-5 Units Subcutaneous QHS  . insulin aspart  0-9 Units Subcutaneous TID WC  . insulin aspart protamine- aspart  45 Units Subcutaneous BID WC  . isosorbide mononitrate  15 mg Oral Daily  . metoprolol succinate  100 mg Oral BID  . potassium chloride SA  20 mEq Oral Daily  . sodium chloride  3 mL Intravenous Q12H  . Warfarin - Pharmacist Dosing Inpatient   Does not apply q1800    Labs:  Recent Labs  09/11/13 0420 09/12/13 0450  NA 142 142  K 3.4* 2.8*  CL 100 101  CO2 30 28  GLUCOSE 90 68*  BUN 51* 50*  CREATININE 3.42* 3.29*  CALCIUM 9.2 9.3    Recent Labs  09/10/13 0315  AST 23  ALT 16  ALKPHOS 37*  BILITOT 0.8  PROT 7.2  ALBUMIN 3.8    Recent Labs  09/10/13 0315  WBC 8.0    NEUTROABS 5.4  HGB 14.5  HCT 44.5  MCV 95.1  PLT 123*    Recent Labs  09/10/13 0315 09/10/13 1200 09/10/13 1730 09/10/13 2340  TROPONINI <0.30 <0.30 <0.30 <0.30    Recent Labs  09/10/13 1200  HGBA1C 7.4*    Recent Labs  09/12/13 0450  INR 1.61*    Radiology/Studies: Dg Chest 2 View 09/10/2013   CLINICAL DATA:  Shortness of breath  EXAM: CHEST  2 VIEW  COMPARISON:  04/30/2013  FINDINGS: Unchanged orientation of biventricular ICD/ pacer from the left. Stable cardiomegaly.  Hyperinflated lungs. There is diffuse interstitial coarsening and fissural thickening. There may be early Kerley B-lines, especially in the lateral projection. No significant effusion. No pneumothorax. Remote left clavicle fracture.   IMPRESSION: Pulmonary venous congestion, bordering on interstitial edema.    Electronically Signed   By: Tiburcio Pea M.D.   On: 09/10/2013 03:51   Echocardiogram: Study Conclusions - Left ventricle: The cavity size was normal. There was mild concentric hypertrophy. Systolic function was severely reduced. The estimated ejection fraction was in the range of 20% to 25%. Wall motion was normal; there were no regional wall motion abnormalities. Doppler parameters are consistent  with elevated ventricular end-diastolic filling pressure. - Aortic valve: Mild regurgitation. - Left atrium: The atrium was moderately to severely dilated. - Right ventricle: The cavity size was mildly dilated. Systolic function was moderately reduced. Systolic pressure was increased. - Right atrium: The atrium was moderately dilated. - Atrial septum: No defect or patent foramen ovale was identified. - Pulmonary arteries: Systolic pressure was mildly increased. PA peak pressure: 47mm Hg (S).  Telemetry: AF with intermittent V pacing   Assessment and Plan  1. Acute on chronic systolic HF - loss of BiV pacing; see #2 - continue medical therapy 2. Permanent AF - only BiV pacing 58% of  time due to AF w/RVR - per Dr [name] [name] and Dr [name] [name], would benefit from AV node ablation  3. 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 Hypokalemia - replete per primary team  Dr. Ladona [name] to see Signed, [name] [name]  EP attending  Agree with above.   [name] [name], M.D.

## 2013-09-12 NOTE — Progress Notes (Signed)
ANTICOAGULATION CONSULT NOTE - Follow up Consult  Pharmacy Consult for Coumadin Indication: atrial fibrillation  Allergies  Allergen Reactions  . Prednisone Other (See Comments)    Raised blood sugar    Patient Measurements: Height: 6' (182.9 cm) Weight: 190 lb 4.1 oz (86.3 kg) IBW/kg (Calculated) : 77.6  Vital Signs: Temp: 98.2 F (36.8 C) (12/22 0603) Temp src: Oral (12/22 0603) BP: 155/67 mmHg (12/22 0603) Pulse Rate: 74 (12/22 0603)  Labs:  Recent Labs  09/10/13 0315 09/10/13 1200 09/10/13 1730 09/10/13 2340 09/11/13 0420 09/11/13 1107 09/12/13 0450  HGB 14.5  --   --   --   --   --   --   HCT 44.5  --   --   --   --   --   --   PLT 123*  --   --   --   --   --   --   APTT 62*  --   --   --   --   --   --   LABPROT 29.2*  --   --   --   --  19.0* 18.7*  INR 2.89*  --   --   --   --  1.64* 1.61*  CREATININE 3.30*  --   --   --  3.42*  --  3.29*  TROPONINI <0.30 <0.30 <0.30 <0.30  --   --   --     Estimated Creatinine Clearance: 19.7 ml/min (by C-G formula based on Cr of 3.29).   Assessment: 79 year old male on coumadin for AFib.  On Coumadin PTA with therapeutic INR on admission. INR remains subtherapeutic at 1.61. CBC ordered for tomorrow AM.  Goal of Therapy:  INR 2-3 Monitor platelets by anticoagulation protocol: Yes   Plan:  1) Coumadin 5 mg po x 1 dose  2) F/u CBC in AM and daily INR  Vinnie Level, PharmD.  Clinical Pharmacist Pager [phone]

## 2013-09-12 NOTE — Progress Notes (Signed)
CARDIAC REHAB PHASE I   PRE:  Rate/Rhythm: 78 A fib POD and PVC's  BP:  Supine:   Sitting: 157/67  Standing:    SaO2: 95 RA  MODE:  Ambulation: 890 ft   POST:  Rate/Rhythm: 114  BP:  Supine:   Sitting: 156/80  Standing:    SaO2: 94 RA 1025-1120 Pt tolerated ambulation well without c/o of cp or SOB. VS stable. Pt to side of bed after walk with call light in reach. Completed CHF education with pt. I gave him CHF packet. We discussed CHF zones, daily weights, fluid restrictions, low sodium diet and encouraged him to watch CHF video. Pt voices understanding. He declines to watch video, stating that he has seen it before. We will follow pt tomorrow.  Eural Holzschuh RN 09/12/2013 11:37 AM

## 2013-09-12 NOTE — H&P (View-Only) (Signed)
  Patient: [name] R Bejar Jr. Date of Encounter: 09/12/2013, 8:47 AM Admit date: 09/10/2013     Subjective  [name] [name] denies any new complaints. He denies CP, SOB or palpitations.   Objective  Physical Exam: Vitals: BP 155/67  Pulse 74  Temp(Src) 98.2 F (36.8 C) (Oral)  Resp 18  Ht 6' (1.829 m)  Wt 190 lb 4.1 oz (86.3 kg)  BMI 25.80 kg/m2  SpO2 94% General: Well developed, well appearing 79 year old male in no acute distress. Neck: Supple. JVD not elevated. Lungs: Clear bilaterally to auscultation without wheezes, rales, or rhonchi. Breathing is unlabored. Heart: Irregular S1 S2 without murmur, rub or gallop.  Abdomen: Soft, non-distended. Extremities: No clubbing or cyanosis. No edema.  Distal pedal pulses are 2+ and equal bilaterally. Neuro: Alert and oriented X 3. Moves all extremities spontaneously. No focal deficits.  Intake/Output:  Intake/Output Summary (Last 24 hours) at 09/12/13 0847 Last data filed at 09/11/13 1300  Gross per 24 hour  Intake    240 ml  Output      0 ml  Net    240 ml    Inpatient Medications:  . aspirin  81 mg Oral Daily  . digoxin  0.125 mg Oral QODAY  . furosemide  80 mg Oral BID  . hydrALAZINE  25 mg Oral TID  . insulin aspart  0-5 Units Subcutaneous QHS  . insulin aspart  0-9 Units Subcutaneous TID WC  . insulin aspart protamine- aspart  45 Units Subcutaneous BID WC  . isosorbide mononitrate  15 mg Oral Daily  . metoprolol succinate  100 mg Oral BID  . potassium chloride SA  20 mEq Oral Daily  . sodium chloride  3 mL Intravenous Q12H  . Warfarin - Pharmacist Dosing Inpatient   Does not apply q1800    Labs:  Recent Labs  09/11/13 0420 09/12/13 0450  NA 142 142  K 3.4* 2.8*  CL 100 101  CO2 30 28  GLUCOSE 90 68*  BUN 51* 50*  CREATININE 3.42* 3.29*  CALCIUM 9.2 9.3    Recent Labs  09/10/13 0315  AST 23  ALT 16  ALKPHOS 37*  BILITOT 0.8  PROT 7.2  ALBUMIN 3.8    Recent Labs  09/10/13 0315  WBC 8.0    NEUTROABS 5.4  HGB 14.5  HCT 44.5  MCV 95.1  PLT 123*    Recent Labs  09/10/13 0315 09/10/13 1200 09/10/13 1730 09/10/13 2340  TROPONINI <0.30 <0.30 <0.30 <0.30    Recent Labs  09/10/13 1200  HGBA1C 7.4*    Recent Labs  09/12/13 0450  INR 1.61*    Radiology/Studies: Dg Chest 2 View 09/10/2013   CLINICAL DATA:  Shortness of breath  EXAM: CHEST  2 VIEW  COMPARISON:  04/30/2013  FINDINGS: Unchanged orientation of biventricular ICD/ pacer from the left. Stable cardiomegaly.  Hyperinflated lungs. There is diffuse interstitial coarsening and fissural thickening. There may be early Kerley B-lines, especially in the lateral projection. No significant effusion. No pneumothorax. Remote left clavicle fracture.   IMPRESSION: Pulmonary venous congestion, bordering on interstitial edema.    Electronically Signed   By: Jonathan  Watts M.D.   On: 09/10/2013 03:51   Echocardiogram: Study Conclusions - Left ventricle: The cavity size was normal. There was mild concentric hypertrophy. Systolic function was severely reduced. The estimated ejection fraction was in the range of 20% to 25%. Wall motion was normal; there were no regional wall motion abnormalities. Doppler parameters are consistent   with elevated ventricular end-diastolic filling pressure. - Aortic valve: Mild regurgitation. - Left atrium: The atrium was moderately to severely dilated. - Right ventricle: The cavity size was mildly dilated. Systolic function was moderately reduced. Systolic pressure was increased. - Right atrium: The atrium was moderately dilated. - Atrial septum: No defect or patent foramen ovale was identified. - Pulmonary arteries: Systolic pressure was mildly increased. PA peak pressure: 47mm Hg (S).  Telemetry: AF with intermittent V pacing   Assessment and Plan  1. Acute on chronic systolic HF - loss of BiV pacing; see #2 - continue medical therapy 2. Permanent AF - only BiV pacing 58% of  time due to AF w/RVR - per Dr SK and Dr GT, would benefit from AV node ablation  3. 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 Hypokalemia - replete per primary team  Dr. Asheley Hellberg to see Signed, EDMISTEN, BROOKE PA-C  EP attending  Agree with above.   Yarelli Decelles, M.D. 

## 2013-09-13 ENCOUNTER — Telehealth: Payer: Self-pay | Admitting: Internal Medicine

## 2013-09-13 DIAGNOSIS — I5043 Acute on chronic combined systolic (congestive) and diastolic (congestive) heart failure: Secondary | ICD-10-CM

## 2013-09-13 DIAGNOSIS — E785 Hyperlipidemia, unspecified: Secondary | ICD-10-CM

## 2013-09-13 LAB — BASIC METABOLIC PANEL
CO2: 29 mEq/L (ref 19–32)
Chloride: 103 mEq/L (ref 96–112)
Creatinine, Ser: 3.46 mg/dL — ABNORMAL HIGH (ref 0.50–1.35)
GFR calc Af Amer: 18 mL/min — ABNORMAL LOW (ref >90)
GFR calc non Af Amer: 15 mL/min — ABNORMAL LOW (ref >90)
Potassium: 3.4 mEq/L — ABNORMAL LOW (ref 3.5–5.1)
Sodium: 146 mEq/L — ABNORMAL HIGH (ref 135–145)

## 2013-09-13 LAB — CBC
Hemoglobin: 14.8 g/dL (ref 13.0–17.0)
MCH: 31.8 pg (ref 26.0–34.0)
MCHC: 33.8 g/dL (ref 30.0–36.0)
Platelets: 165 10*3/uL (ref 150–400)
RBC: 4.65 MIL/uL (ref 4.22–5.81)
RDW: 14.7 % (ref 11.5–15.5)

## 2013-09-13 LAB — PROTIME-INR
INR: 1.4 (ref 0.00–1.49)
Prothrombin Time: 16.8 seconds — ABNORMAL HIGH (ref 11.6–15.2)

## 2013-09-13 LAB — GLUCOSE, CAPILLARY: Glucose-Capillary: 82 mg/dL (ref 70–99)

## 2013-09-13 MED ORDER — DIGOXIN 125 MCG PO TABS: 0.1250 mg | ORAL_TABLET | ORAL | Status: DC

## 2013-09-13 NOTE — Progress Notes (Signed)
09/13/2013 12:27 PM Nursing note Discharge avs form, medications already taken today and those due this evening given and explained to patient and family. Follow up appointments, activity restrictions, cath site care and when to call MD reviewed. Clarified with Dr. Ladona Ridgel that pt. Should follow No Salt diet and activity restrictions should last one week.  Pt. And family updated on instructions and verbalized understanding. IV line d/c by NT. D/c tele. D/c home with family per orders.  Elic Vencill, Blanchard Kelch

## 2013-09-13 NOTE — Discharge Summary (Addendum)
Physician Discharge Summary  [name] [name]. ZOX:[id number] DOB: [birth year] DOA: 09/10/2013  PCP: Judie Petit, MD  Admit date: 09/10/2013 Discharge date: 09/13/2013  Time spent: >30 minutes  Recommendations for Outpatient Follow-up:  1. BMET to follow electrolytes and renal function 2. Reassess BP and adjust medication as needed 3. Check digoxin level and if low/renal function stable resume to daily basis  BNP    Component Value Date/Time   PROBNP 6255.0* 09/12/2013 0450   Filed Weights   09/11/13 0557 09/12/13 0603 09/13/13 0430  Weight: 86.7 kg (191 lb 2.2 oz) 86.3 kg (190 lb 4.1 oz) 83.8 kg (184 lb 11.9 oz)     Discharge Diagnoses:  Principal Problem:   Systolic and diastolic CHF, acute on chronic Active Problems:   DIABETES MELLITUS, TYPE II   HYPERLIPIDEMIA   HYPERTENSION   CORONARY ARTERY DISEASE   Chronic atrial fibrillation   BIVentricular Defibrillator--St Jude   Chronic renal insufficiency, stage IV (severe)   Discharge Condition: stable and improved. Will follow with cardiology (EP) in 1 week. Advise to follow low sodium diet and to follow with PCP in 2 weeks  Diet recommendation: low sodium and low carb diet  History of present illness:  79 year old male with history of chronic systolic CHF with EF of 20%, CAD, atrial fibrillation presented to the ER with shortness of breath. History was obtained from the patient and his family in the room. Patient reported that yesterday around 5 PM he started having difficulty catching his breath, even at rest. He did mention dry cough but no productive phlegm, fever, chills or any recent upper respiratory illness. Denied any chest pains, palpitations, diaphoresis. He denied any recent weight gain, has some lower extremity swelling.  Patient's family however reported that he has been eating salted pistachios (patient himself admitted as well), and canned food.  patient has a history of chronic kidney disease, stage  III, follows Dr. Eliott Nine.  In the ER, BNP was 10,776 chest x-ray showed interstitial edema   Hospital Course:  1-Systolic and diastolic CHF, acute on chronic: patient last EF in the 20%  04/2013.  -CE'z negative  -s/p pacemaker/defibrillator  -has lost 13 pounds total during this admission -Cr remained essentially at baseline for CKD -no JVD, no crackles and just trace edema  -will continue lasix 80mg  PO daily -s/p nodal ablation; will follow with Cardiology as an outpatient  -BNP 6255 down from 10,000  -patient educated regarding low sodium diet (less than 2 grams daily)  -on low dose ACE  2-Chronic renal insufficiency, stage IV (severe): Cr was slightly elevated with IV diuresis at the begining.  -will monitor in outpatient setting -lasix changed to PO once a day -today (12/23) Cr 3.4  3-BIVentricular Defibrillator--St Jude: interrogated and working properly only 58% of the time.  -s/p nodal ablation by cardiology/EP  -will continue digoxin and metoprolol as indicated by cardiology  -patient will follow with cardiology in 1-2 weeks  4-HYPERTENSION: stable. Will continue current regimen   5-HYPERLIPIDEMIA: continue low fat diet. Not on statins at home.   6-DIABETES MELLITUS, TYPE II: continue NPH and SSI   7-CORONARY ARTERY DISEASE: no CP  -troponin neg  -will continue ASA, imdur and b-blocker   8-Chronic atrial fibrillation: continue digoxin, metoprolol and coumadin.  -s/p nodal ablation and now 100% pacemaker dependable  -will follow with cardiology in the outpatient setting   Procedures:  Nodal ablation 12/22 by Dr. Ladona Ridgel  Consultations:  Cardiology   Discharge Exam: Filed Vitals:  09/13/13 0927  BP: 141/62  Pulse: 90  Temp:   Resp:    General: Alert, awake, oriented x3, in no acute distress. Denies CP and breathing significantly improved. Able to speak in full sentences  HEENT: No bruits, no goiter. No JVD  Heart: irregular, no rubs or gallops. Le  edema trace bilaterally  Lungs: Good air movement bilateral; no crackles and no wheezing  Abdomen: Soft, nontender, nondistended, positive bowel sounds.  Neuro: Grossly intact, nonfocal.   Discharge Instructions  Discharge Orders   Future Appointments Provider Department Dept Phone   09/28/2013 11:00 AM [facility] Device 1 [facility] [place] Office [phone]   10/20/2013 9:00 AM [facility] Coumadin [facility] [place] [phone]   10/28/2013 8:45 AM [facility] Lab [facility] [place] [phone]   11/02/2013 10:00 AM Myeong Eyvonne Left [facility] [phone]   11/04/2013 8:00 AM Lindley Magnus, MD [facility] [place] [phone]   12/08/2013 9:45 AM [facility] Device Remotes [facility] [phone]   Future Orders Complete By Expires   Amb Referral to Cardiac Rehabilitation  As directed    Diet - low sodium heart healthy  As directed    Discharge instructions  As directed    Comments:     Low sodium diet (less than 2 grams daily) Follow up with cardiology in 1 week Take medications as prescribed       Medication List         aspirin 81 MG chewable tablet  Chew 81 mg by mouth daily.     digoxin 0.125 MG tablet  Commonly known as:  LANOXIN  Take 1 tablet (0.125 mg total) by mouth every other day.     FREESTYLE TEST STRIPS test strip  Generic drug:  glucose blood  1 each by Other route as needed. Use as instructed     furosemide 80 MG tablet  Commonly known as:  LASIX  TAKE 1 TABLET BY MOUTH EVERY DAY     hydrALAZINE 25 MG tablet  Commonly known as:  APRESOLINE  Take 25 mg by mouth 3 (three) times daily.     insulin lispro protamine-lispro (75-25) 100 UNIT/ML Susp injection  Commonly known as:  HUMALOG 75/25  Inject 45 Units into the skin 2 (two) times daily with a meal.     isosorbide mononitrate 30 MG 24 hr tablet  Commonly known as:  IMDUR  Take 0.5 tablets (15 mg total) by mouth daily.      lisinopril 20 MG tablet  Commonly known as:  PRINIVIL,ZESTRIL  Take 10 mg by mouth daily.     meclizine 25 MG tablet  Commonly known as:  ANTIVERT  Take 1 tablet (25 mg total) by mouth 3 (three) times daily as needed for dizziness or nausea.     metoprolol succinate 100 MG 24 hr tablet  Commonly known as:  TOPROL-XL  Take 100 mg by mouth 2 (two) times daily. Take with or immediately following a meal.     nitroGLYCERIN 0.4 MG SL tablet  Commonly known as:  NITROSTAT  Place 0.4 mg under the tongue every 5 (five) minutes x 3 doses as needed for chest pain.     potassium chloride SA 20 MEQ tablet  Commonly known as:  K-DUR,KLOR-CON  Take 20 mEq by mouth daily.     warfarin 5 MG tablet  Commonly known as:  COUMADIN  Take 2.5-5 mg by mouth daily. Monday, Wednesday, Friday, takes 2.5 mg; Tuesday, Thursday, Saturday,  Sunday takes 5 mg       Allergies  Allergen Reactions  . Prednisone Other (See Comments)    Raised blood sugar       Follow-up Information   Follow up with [facility] On 09/28/2013. (At 11:00 AM for device check)    Specialty:  Cardiology   Contact information:   [address], [address] [place] [address] [phone]      Follow up with Judie Petit, MD. Schedule an appointment as soon as possible for a visit in 2 weeks.   Specialties:  Internal Medicine, Radiology   Contact information:   [address] [place] [address] [phone]       The results of significant diagnostics from this hospitalization (including imaging, microbiology, ancillary and laboratory) are listed below for reference.    Significant Diagnostic Studies: Dg Chest 2 View  09/10/2013   CLINICAL DATA:  Shortness of breath  EXAM: CHEST  2 VIEW  COMPARISON:  04/30/2013  FINDINGS: Unchanged orientation of biventricular ICD/ pacer from the left. Stable cardiomegaly.  Hyperinflated lungs. There is diffuse interstitial coarsening and fissural  thickening. There may be early Kerley B-lines, especially in the lateral projection. No significant effusion. No pneumothorax. Remote left clavicle fracture.  IMPRESSION: Pulmonary venous congestion, bordering on interstitial edema.   Electronically Signed   By: Tiburcio Pea M.D.   On: 09/10/2013 03:51   Labs: Basic Metabolic Panel:  Recent Labs Lab 09/10/13 0315 09/11/13 0420 09/12/13 0450 09/12/13 0955 09/13/13 0515  NA 142 142 142  --  146*  K 4.1 3.4* 2.8*  --  3.4*  CL 103 100 101  --  103  CO2 26 30 28   --  29  GLUCOSE 90 90 68*  --  92  BUN 49* 51* 50*  --  56*  CREATININE 3.30* 3.42* 3.29*  --  3.46*  CALCIUM 9.4 9.2 9.3  --  9.4  MG  --   --   --  2.1  --    Liver Function Tests:  Recent Labs Lab 09/10/13 0315  AST 23  ALT 16  ALKPHOS 37*  BILITOT 0.8  PROT 7.2  ALBUMIN 3.8   CBC:  Recent Labs Lab 09/10/13 0315 09/13/13 0515  WBC 8.0 9.0  NEUTROABS 5.4  --   HGB 14.5 14.8  HCT 44.5 43.8  MCV 95.1 94.2  PLT 123* 165   Cardiac Enzymes:  Recent Labs Lab 09/10/13 0315 09/10/13 1200 09/10/13 1730 09/10/13 2340  TROPONINI <0.30 <0.30 <0.30 <0.30   BNP: BNP (last 3 results)  Recent Labs  09/10/13 0315 09/12/13 0450  PROBNP 10776.0* 6255.0*   CBG:  Recent Labs Lab 09/12/13 0603 09/12/13 1116 09/12/13 1433 09/12/13 2206 09/13/13 0626  GLUCAP 80 146* 129* 156* 82    Signed:  Natally Ribera  [facility] 09/13/2013, 11:28 AM

## 2013-09-13 NOTE — Progress Notes (Signed)
SUBJECTIVE: The patient is doing well today.  At this time, he denies chest pain, shortness of breath, or any new concerns. S/p AVN ablation yesterday.   CURRENT MEDICATIONS: . aspirin  81 mg Oral Daily  . digoxin  0.125 mg Oral QODAY  . furosemide  80 mg Oral BID  . hydrALAZINE  25 mg Oral TID  . insulin aspart  0-5 Units Subcutaneous QHS  . insulin aspart  0-9 Units Subcutaneous TID WC  . insulin aspart protamine- aspart  45 Units Subcutaneous BID WC  . isosorbide mononitrate  15 mg Oral Daily  . metoprolol succinate  100 mg Oral Daily  . potassium chloride  40 mEq Oral Daily  . sodium chloride  3 mL Intravenous Q12H  . Warfarin - Pharmacist Dosing Inpatient   Does not apply q1800      OBJECTIVE: Physical Exam: Filed Vitals:   09/12/13 1900 09/12/13 2136 09/13/13 0424 09/13/13 0430  BP: 152/68 150/71 153/92   Pulse:  91 92   Temp:  98.6 F (37 C) 98.3 F (36.8 C)   TempSrc:  Oral Oral   Resp:  18 18   Height:      Weight:    184 lb 11.9 oz (83.8 kg)  SpO2:  94% 96%     Intake/Output Summary (Last 24 hours) at 09/13/13 0631 Last data filed at 09/12/13 0900  Gross per 24 hour  Intake    240 ml  Output      0 ml  Net    240 ml    Telemetry reveals atrial fibrillation with ventricular pacing at 90  GEN- The patient is well appearing, alert and oriented x 3 today.  HEENT - unremarkable Lungs- Clear to ausculation bilaterally, normal work of breathing Heart- Regular rate and rhythm, no murmurs, rubs or gallops, PMI not laterally displaced GI- soft, NT, ND, + BS Extremities- no clubbing, cyanosis, or edema, minimal ecchymosis over right groin insertion site. Skin- no rash or lesion Neuro- strength and sensation are intact  LABS: Basic Metabolic Panel:  Recent Labs  16/10/96 0450 09/12/13 0955 09/13/13 0515  NA 142  --  146*  K 2.8*  --  3.4*  CL 101  --  103  CO2 28  --  29  GLUCOSE 68*  --  92  BUN 50*  --  56*  CREATININE 3.29*  --  3.46*  CALCIUM  9.3  --  9.4  MG  --  2.1  --    CBC:  Recent Labs  09/13/13 0515  WBC 9.0  HGB 14.8  HCT 43.8  MCV 94.2  PLT 165   Cardiac Enzymes:  Recent Labs  09/10/13 1200 09/10/13 1730 09/10/13 2340  TROPONINI <0.30 <0.30 <0.30   RADIOLOGY: Dg Chest 2 View 09/10/2013   CLINICAL DATA:  Shortness of breath  EXAM: CHEST  2 VIEW  COMPARISON:  04/30/2013  FINDINGS: Unchanged orientation of biventricular ICD/ pacer from the left. Stable cardiomegaly.  Hyperinflated lungs. There is diffuse interstitial coarsening and fissural thickening. There may be early Kerley B-lines, especially in the lateral projection. No significant effusion. No pneumothorax. Remote left clavicle fracture.  IMPRESSION: Pulmonary venous congestion, bordering on interstitial edema.   Electronically Signed   By: Tiburcio Pea M.D.   On: 09/10/2013 03:51    ASSESSMENT AND PLAN:  Principal Problem:   Systolic and diastolic CHF, acute on chronic Active Problems:   DIABETES MELLITUS, TYPE II   HYPERLIPIDEMIA   HYPERTENSION  CORONARY ARTERY DISEASE   Chronic atrial fibrillation   BIVentricular Defibrillator--St Jude   Chronic renal insufficiency, stage IV (severe)  Rec: doing well s/p AV node ablation. He may be discharged home. We will have him back in clinic in 1-2 weeks.   Leonia Reeves.D.

## 2013-09-13 NOTE — Telephone Encounter (Signed)
Pt needs hosp fup w/ Dr Cato Mulligan.  Pt states he does not want to see anyone else, he discussed this w/ dr swords. Pt needs to come in w/in 2 wks.  pls advise

## 2013-09-13 NOTE — Progress Notes (Signed)
Cardiac Rehab 1020-1100 Pt states that has walked in hall, denies any problems. Competed CHF education with pt. I gave him exercise guidelines. We discussed Outpt CRP,he agrees to referral to [place].

## 2013-09-13 NOTE — Progress Notes (Signed)
09/13/2013 12:31 PM Nursing note Clarified with Dr. Gwenlyn Perking pt. Listed to continue Lisinopril at discharge after d/c summary stated no ACE due to renal failure. Verbal orders Dr. Gwenlyn Perking ok for patient to resume med at discharge. When speaking with patient on the matter pt. States he did not take Lisinopril at home and would not take the medication upon arrival home until follow up with his cardiologist.  [name] [name]

## 2013-09-14 NOTE — Telephone Encounter (Signed)
Pt scheduled for 8 am on 12/26, ok per Dr Cato Mulligan

## 2013-09-16 ENCOUNTER — Encounter: Payer: Self-pay | Admitting: Internal Medicine

## 2013-09-16 ENCOUNTER — Ambulatory Visit (INDEPENDENT_AMBULATORY_CARE_PROVIDER_SITE_OTHER): Payer: Medicare Other | Admitting: Internal Medicine

## 2013-09-16 ENCOUNTER — Encounter (HOSPITAL_COMMUNITY): Payer: Self-pay | Admitting: *Deleted

## 2013-09-16 VITALS — BP 140/90 | HR 88 | Temp 98.0°F | Ht 72.0 in | Wt 181.0 lb

## 2013-09-16 DIAGNOSIS — I5043 Acute on chronic combined systolic (congestive) and diastolic (congestive) heart failure: Secondary | ICD-10-CM

## 2013-09-16 DIAGNOSIS — I509 Heart failure, unspecified: Secondary | ICD-10-CM

## 2013-09-16 LAB — BASIC METABOLIC PANEL
BUN: 62 mg/dL — ABNORMAL HIGH (ref 6–23)
CO2: 25 mEq/L (ref 19–32)
Calcium: 9.2 mg/dL (ref 8.4–10.5)
Creatinine, Ser: 3.5 mg/dL — ABNORMAL HIGH (ref 0.4–1.5)
GFR: 18.04 mL/min — ABNORMAL LOW (ref >60.00)
Glucose, Bld: 135 mg/dL — ABNORMAL HIGH (ref 70–99)

## 2013-09-16 MED ORDER — LISINOPRIL 20 MG PO TABS: 10.0000 mg | ORAL_TABLET | Freq: Every day | ORAL | Status: DC

## 2013-09-16 NOTE — Progress Notes (Signed)
Hospital follow up See telephone note 09/13/13 Reviewed discharge summary. Note AV ablation  Reviewed most recent echo. He currently feels well. No complaints  Past Medical History  Diagnosis Date  . Chronic atrial fibrillation     a. Historically difficult rate control. b. s/p CRT-D implantation with anticipation of AV junction ablation but patient then was able to accomplish rate control and ablation not undertaken.  [name] [name] CARDIOMYOPATHY, PRIMARY, DILATED     a. Mixed ICM/NICM - EF 15% by echo 05/2009; St. Jude CRT-D implanted 06/2009. b. Echo 04/2013 - EF 20%, mild LVH.  [name] [name] COLON CANCER, HX OF     a. s/p partial colectomy.  . Chronic systolic CHF (congestive heart failure)   . CORONARY ARTERY DISEASE     a. BMS to LAD 04/2008. b. Cath 06/2009: nonobstructive disease.  [name] [name] PROSTATE CANCER, HX OF     a. s/p radical prostatectomy.  [name] [name] SKIN CANCER, HX OF   . SLEEP APNEA   . Hypertension   . Hyperlipidemia   . Diabetes mellitus   . Hyperkalemia   . Arthritis   . Osteoarthritis   . Tubular adenoma of colon   . Secondary hyperparathyroidism   . Automatic implantable cardioverter-defibrillator in situ   . CKD (chronic kidney disease), stage IV     a. Right brachiocephalic AV fistula 03/2012. Neph = [place].  . Vertigo   . Valvular heart disease     a. Echo 04/2013: mild AI, mild MR.  . Pulmonary HTN     a. PA pressure by echo 05/01/13.    History   Social History  . Marital Status: Married    Spouse Name: N/A    Number of Children: 4  . Years of Education: N/A   Occupational History  . retired    Social History Main Topics  . Smoking status: Former Smoker    Types: Cigarettes    Quit date: 09/22/1966  . Smokeless tobacco: Never Used  . Alcohol Use: 0 - .5 oz/week    0-1 drink(s) per week     Comment: occasional  . Drug Use: No  . Sexual Activity: No   Other Topics Concern  . Not on file   Social History Narrative  . No narrative on file    Past Surgical History   Procedure Laterality Date  . Colectomy    . Prostatectomy    . Penile prosthesis placement    . Removed prosthetic eye    . Ptca      stent placed  . Total knee arthroplasty  2008    right  . St jude unify      St. Jude single chamber defibrillator 07/19/09 (Dr. Graciela Husbands)  . Pacemaker insertion  2010  . Av fistula placement  03/30/2012    Procedure: ARTERIOVENOUS (AV) FISTULA CREATION;  Surgeon: Chuck Hint, MD;  Location: [facility] OR;  Service: Vascular;  Laterality: Right;  Creation of BrachioCephalic Fistula Right arm  . Cardiac catheterization    . Coronary angioplasty    . Eye surgery      left eye prothesis  . Fracture surgery      collar bone, right knee replacement  . Insert / replace / remove pacemaker      Family History  Problem Relation Age of Onset  . Heart disease Father   . Throat cancer Father   . Throat cancer Mother   . Heart disease Mother   . Hypertension Mother     Allergies  Allergen Reactions  . Prednisone Other (See Comments)    Raised blood sugar    Current Outpatient Prescriptions on File Prior to Visit  Medication Sig Dispense Refill  . aspirin 81 MG chewable tablet Chew 81 mg by mouth daily.      . digoxin (LANOXIN) 0.125 MG tablet Take 1 tablet (0.125 mg total) by mouth every other day.      . furosemide (LASIX) 80 MG tablet TAKE 1 TABLET BY MOUTH EVERY DAY  90 tablet  3  . glucose blood (FREESTYLE TEST STRIPS) test strip 1 each by Other route as needed. Use as instructed       . hydrALAZINE (APRESOLINE) 25 MG tablet Take 25 mg by mouth 3 (three) times daily.      . insulin lispro protamine-lispro (HUMALOG 75/25) (75-25) 100 UNIT/ML SUSP injection Inject 45 Units into the skin 2 (two) times daily with a meal.      . isosorbide mononitrate (IMDUR) 30 MG 24 hr tablet Take 0.5 tablets (15 mg total) by mouth daily.  90 tablet  3  . lisinopril (PRINIVIL,ZESTRIL) 20 MG tablet Take 10 mg by mouth daily.      . meclizine (ANTIVERT) 25 MG tablet  Take 1 tablet (25 mg total) by mouth 3 (three) times daily as needed for dizziness or nausea.  30 tablet  0  . metoprolol succinate (TOPROL-XL) 100 MG 24 hr tablet Take 100 mg by mouth 2 (two) times daily. Take with or immediately following a meal.      . nitroGLYCERIN (NITROSTAT) 0.4 MG SL tablet Place 0.4 mg under the tongue every 5 (five) minutes x 3 doses as needed for chest pain.      . potassium chloride SA (K-DUR,KLOR-CON) 20 MEQ tablet Take 20 mEq by mouth daily.       [name] [name] warfarin (COUMADIN) 5 MG tablet Take 2.5-5 mg by mouth daily. Monday, Wednesday, Friday, takes 2.5 mg; Tuesday, Thursday, Saturday, Sunday takes 5 mg       No current facility-administered medications on file prior to visit.     patient denies chest pain, shortness of breath, orthopnea. Denies lower extremity edema, abdominal pain, change in appetite, change in bowel movements. Patient denies rashes, musculoskeletal complaints. No other specific complaints in a complete review of systems.   BP 158/82  Pulse 88  Temp(Src) 98 F (36.7 C) (Oral)  Ht 6' (1.829 m)  Wt 181 lb (82.101 kg)  BMI 24.54 kg/m2  well-developed well-nourished male in no acute distress. HEENT exam atraumatic, normocephalic, neck supple without jugular venous distention. Chest clear to auscultation cardiac exam S1-S2 are regular. Abdominal exam overweight with bowel sounds, soft and nontender. Extremities no edema. Neurologic exam is alert with a normal gait.   Systolic and diastolic CHF, acute on chronic He is much improved after hohspitalization and AV nodal ablation He monitors weight daily He is starting an ace-i and will need CRT/GFR followed closely Check labs today

## 2013-09-16 NOTE — Progress Notes (Signed)
Pre visit review using our clinic review tool, if applicable. No additional management support is needed unless otherwise documented below in the visit note. 

## 2013-09-17 NOTE — Assessment & Plan Note (Signed)
He is much improved after hohspitalization and AV nodal ablation He monitors weight daily He is starting an ace-i and will need CRT/GFR followed closely Check labs today

## 2013-09-20 ENCOUNTER — Encounter: Payer: Self-pay | Admitting: Cardiology

## 2013-09-20 ENCOUNTER — Ambulatory Visit (INDEPENDENT_AMBULATORY_CARE_PROVIDER_SITE_OTHER): Payer: Medicare Other | Admitting: Cardiology

## 2013-09-20 ENCOUNTER — Other Ambulatory Visit: Payer: Self-pay | Admitting: Internal Medicine

## 2013-09-20 VITALS — BP 130/84 | HR 91 | Ht 72.0 in | Wt 193.4 lb

## 2013-09-20 DIAGNOSIS — Z95810 Presence of automatic (implantable) cardiac defibrillator: Secondary | ICD-10-CM

## 2013-09-20 DIAGNOSIS — I5022 Chronic systolic (congestive) heart failure: Secondary | ICD-10-CM

## 2013-09-20 DIAGNOSIS — Z9889 Other specified postprocedural states: Secondary | ICD-10-CM

## 2013-09-20 DIAGNOSIS — I2589 Other forms of chronic ischemic heart disease: Secondary | ICD-10-CM

## 2013-09-20 DIAGNOSIS — I4891 Unspecified atrial fibrillation: Secondary | ICD-10-CM

## 2013-09-20 DIAGNOSIS — I428 Other cardiomyopathies: Secondary | ICD-10-CM

## 2013-09-20 DIAGNOSIS — I255 Ischemic cardiomyopathy: Secondary | ICD-10-CM

## 2013-09-20 NOTE — Patient Instructions (Signed)
Your physician recommends that you continue on your current medications as directed. Please refer to the Current Medication list given to you today.     

## 2013-09-21 NOTE — Progress Notes (Signed)
ELECTROPHYSIOLOGY OFFICE NOTE  Patient ID: [name] [name]. MRN: [id number], DOB/AGE: [birth year]   Date of Visit: 09/20/2013  Primary Physician: Judie Petit, MD Primary Cardiologist: Excell Seltzer, MD Primary EP: Graciela Husbands, MD Reason for Visit: Hospital follow-up  History of Present Illness  [name] Kenna. is a 79 y.o. male with chronic atrial fibrillation on Coumadin, chronic systolic HF, mixed ICM/NICM s/p CRT-D implant, CAD s/p LAD stent 2009, CKD stage IV (AV fistula placed 03/2012) who presented to [facility] 09/10/2013 with SOB, found to have rapid AF and acute on chronic systolic HF.   He had been evaluated by Dr. Graciela Husbands just a few days before on 12/16 at which time it was noted that the patient was experiencing increasing heart failure symptoms in setting of LV dysfunction and permanent atrial fib with rapid rates and only BiV pacing 72% of the time. Dr. Graciela Husbands then recommended AV node ablation.  On device interrogation in the hospital, his BiV pacing was noted to be lower at 58% with rapid AF. He was evaluated by Dr. Ladona Ridgel and underwent AV node ablation on 09/12/2013. He presents today for hospital follow-up. He is accompanied by his son in-law.   Since discharge, he reports he is doing well and has no complaints. He denies chest pain or shortness of breath. He denies palpitations, dizziness, near syncope or syncope. He denies LE swelling, orthopnea, PND or recent weight gain. He is compliant with medications. He also reports compliance with low sodium diet.  Past Medical History Past Medical History  Diagnosis Date  . Chronic atrial fibrillation     a. Historically difficult rate control. b. s/p CRT-D implantation with anticipation of AV junction ablation but patient then was able to accomplish rate control and ablation not undertaken.; AVN ablation 08/2013 by Dr Ladona Ridgel  . CARDIOMYOPATHY, PRIMARY, DILATED     a. Mixed ICM/NICM - EF 15% by echo 05/2009; St. Jude CRT-D  implanted 06/2009. b. Echo 04/2013 - EF 20%, mild LVH.  [name] COLON CANCER, HX OF     a. s/p partial colectomy.  . Chronic systolic CHF (congestive heart failure)   . CORONARY ARTERY DISEASE     a. BMS to LAD 04/2008. b. Cath 06/2009: nonobstructive disease.  [name] PROSTATE CANCER, HX OF     a. s/p radical prostatectomy.  [name] SKIN CANCER, HX OF   . SLEEP APNEA   . Hypertension   . Hyperlipidemia   . Diabetes mellitus   . Osteoarthritis   . Tubular adenoma of colon   . Secondary hyperparathyroidism   . CKD (chronic kidney disease), stage IV     a. Right brachiocephalic AV fistula 03/2012. Neph = Dunham.  . Vertigo   . Valvular heart disease     a. Echo 04/2013: mild AI, mild MR.  . Pulmonary HTN     a. PA pressure by echo 05/01/13.    Past Surgical History Past Surgical History  Procedure Laterality Date  . Colectomy    . Prostatectomy    . Penile prosthesis placement    . Removed prosthetic eye    . Ptca      stent placed  . Total knee arthroplasty  2008    right  . Pacemaker insertion  2010  . Av fistula placement  03/30/2012    Procedure: ARTERIOVENOUS (AV) FISTULA CREATION;  Surgeon: Chuck Hint, MD;  Location: [facility] OR;  Service: Vascular;  Laterality: Right;  Creation of BrachioCephalic Fistula Right arm  .  Eye surgery      left eye prothesis  . Fracture surgery      collar bone, right knee replacement  . Bi-ventricular implantable cardioverter defibrillator  (crt-d)  2010    STJ CRTD implanted by Dr Graciela Husbands  . Ablation  08/2013    AVN ablation by Dr Ladona Ridgel    Allergies/Intolerances Allergies  Allergen Reactions  . Prednisone Other (See Comments)    Raised blood sugar    Current Home Medications Current Outpatient Prescriptions  Medication Sig Dispense Refill  . aspirin 81 MG chewable tablet Chew 81 mg by mouth daily.      . digoxin (LANOXIN) 0.125 MG tablet Take 1 tablet (0.125 mg total) by mouth every other day.      . furosemide (LASIX) 80 MG tablet TAKE  1 TABLET BY MOUTH EVERY DAY  90 tablet  3  . glucose blood (FREESTYLE TEST STRIPS) test strip 1 each by Other route as needed. Use as instructed       . hydrALAZINE (APRESOLINE) 25 MG tablet Take 25 mg by mouth 3 (three) times daily.      . insulin lispro protamine-lispro (HUMALOG 75/25) (75-25) 100 UNIT/ML SUSP injection Inject 45 Units into the skin 2 (two) times daily with a meal.      . isosorbide mononitrate (IMDUR) 30 MG 24 hr tablet Take 0.5 tablets (15 mg total) by mouth daily.  90 tablet  3  . lisinopril (PRINIVIL,ZESTRIL) 20 MG tablet Take 0.5 tablets (10 mg total) by mouth daily.  90 tablet  3  . meclizine (ANTIVERT) 25 MG tablet Take 1 tablet (25 mg total) by mouth 3 (three) times daily as needed for dizziness or nausea.  30 tablet  0  . metoprolol succinate (TOPROL-XL) 100 MG 24 hr tablet Take 100 mg by mouth 2 (two) times daily. Take with or immediately following a meal.      . nitroGLYCERIN (NITROSTAT) 0.4 MG SL tablet Place 0.4 mg under the tongue every 5 (five) minutes x 3 doses as needed for chest pain.      . potassium chloride SA (K-DUR,KLOR-CON) 20 MEQ tablet Take 20 mEq by mouth daily.       [name] warfarin (COUMADIN) 5 MG tablet Take 2.5-5 mg by mouth daily. Monday, Wednesday, Friday, takes 2.5 mg; Tuesday, Thursday, Saturday, Sunday takes 5 mg       No current facility-administered medications for this visit.    Social History History   Social History  . Marital Status: Married    Spouse Name: N/A    Number of Children: 4  . Years of Education: N/A   Occupational History  . retired    Social History Main Topics  . Smoking status: Former Smoker    Types: Cigarettes    Quit date: 09/22/1966  . Smokeless tobacco: Never Used  . Alcohol Use: 0 - .5 oz/week    0-1 drink(s) per week     Comment: occasional  . Drug Use: No  . Sexual Activity: No   Other Topics Concern  . Not on file   Social History Narrative  . No narrative on file     Review of  Systems General: No chills, fever, night sweats or weight changes Cardiovascular: No chest pain, dyspnea on exertion, edema, orthopnea, palpitations, paroxysmal nocturnal dyspnea Dermatological: No rash, lesions or masses Respiratory: No cough, dyspnea Urologic: No hematuria, dysuria Abdominal: No nausea, vomiting, diarrhea, bright red blood per rectum, melena, or hematemesis Neurologic: No visual changes,  weakness, changes in mental status All other systems reviewed and are otherwise negative except as noted above.  Physical Exam Vitals: Blood pressure 130/84, pulse 91, height 6' (1.829 m), weight 193 lb 6.4 oz (87.726 kg), SpO2 99.00%.  General: Well developed, well appearing 79 y.o. male in no acute distress. HEENT: Normocephalic, atraumatic. EOMs intact. Sclera nonicteric. Oropharynx clear.  Neck: Supple. No JVD. Lungs: Respirations regular and unlabored, CTA bilaterally. No wheezes, rales or rhonchi. Heart: RRR. S1, S2 present. No murmurs, rub, S3 or S4. Abdomen: Soft, non-distended.  Extremities: No clubbing, cyanosis or edema. PT/Radials 2+ and equal bilaterally. Right groin intact and well healed with minimal ecchymosis. Psych: Normal affect. Neuro: Alert and oriented X 3. Moves all extremities spontaneously.   Diagnostics Device interrogation - normal BiV ICD function; BiV pacing 94% of time; no ventricular arrhythmias; see PaceArt report for full details  Assessment and Plan 1. Permanent AF  - with uncontrolled rates and decreased BiV pacing, now s/p AV node ablation - now BiV pacing 94% of time - lowered base rate to 80 bpm today per protocol post AV node ablation - return to device clinic in 2 weeks - return to see Dr. Graciela Husbands in 6 weeks 2. Mixed ischemic / nonischemic CM s/p CRT-D implant - normal device function - no ventricular arrhythmias - lowered base rate to 80 bpm today per protocol post AV node ablation 3. Chronic systolic HF - stable; euvolemic - continue  medical therapy  - counseled regarding low sodium diet and daily weights  Signed, Dearius Hoffmann, PA-C 09/21/2013, 9:29 AM

## 2013-09-23 LAB — MDC_IDC_ENUM_SESS_TYPE_INCLINIC
Battery Remaining Longevity: 38.4 mo
Brady Statistic RA Percent Paced: 0 %
Brady Statistic RV Percent Paced: 94 %
Date Time Interrogation Session: 20141230184917
HighPow Impedance: 45.2669
Implantable Pulse Generator Serial Number: 595859
Lead Channel Impedance Value: 425 Ohm
Lead Channel Impedance Value: 425 Ohm
Lead Channel Pacing Threshold Amplitude: 0.5 V
Lead Channel Pacing Threshold Amplitude: 0.875 V
Lead Channel Pacing Threshold Pulse Width: 0.6 ms
Lead Channel Pacing Threshold Pulse Width: 1 ms
Lead Channel Sensing Intrinsic Amplitude: 12 mV
Lead Channel Setting Pacing Amplitude: 2 V
Lead Channel Setting Pacing Amplitude: 2.5 V
Lead Channel Setting Pacing Pulse Width: 0.6 ms
Lead Channel Setting Pacing Pulse Width: 1 ms
Lead Channel Setting Sensing Sensitivity: 0.5 mV
Zone Setting Detection Interval: 250 ms
Zone Setting Detection Interval: 300 ms

## 2013-09-30 ENCOUNTER — Other Ambulatory Visit (INDEPENDENT_AMBULATORY_CARE_PROVIDER_SITE_OTHER): Payer: Medicare Other

## 2013-09-30 DIAGNOSIS — I509 Heart failure, unspecified: Secondary | ICD-10-CM

## 2013-09-30 LAB — BASIC METABOLIC PANEL
BUN: 52 mg/dL — AB (ref 6–23)
CALCIUM: 9.1 mg/dL (ref 8.4–10.5)
CO2: 27 mEq/L (ref 19–32)
CREATININE: 3.4 mg/dL — AB (ref 0.4–1.5)
Chloride: 109 mEq/L (ref 96–112)
GFR: 18.46 mL/min — AB (ref >60.00)
GLUCOSE: 142 mg/dL — AB (ref 70–99)
Potassium: 4.2 mEq/L (ref 3.5–5.1)
Sodium: 144 mEq/L (ref 135–145)

## 2013-10-05 ENCOUNTER — Ambulatory Visit (INDEPENDENT_AMBULATORY_CARE_PROVIDER_SITE_OTHER): Payer: Medicare Other | Admitting: *Deleted

## 2013-10-05 ENCOUNTER — Telehealth: Payer: Self-pay | Admitting: Cardiovascular Disease

## 2013-10-05 DIAGNOSIS — I428 Other cardiomyopathies: Secondary | ICD-10-CM

## 2013-10-05 DIAGNOSIS — I5043 Acute on chronic combined systolic (congestive) and diastolic (congestive) heart failure: Secondary | ICD-10-CM

## 2013-10-05 DIAGNOSIS — I509 Heart failure, unspecified: Secondary | ICD-10-CM

## 2013-10-05 DIAGNOSIS — I4891 Unspecified atrial fibrillation: Secondary | ICD-10-CM

## 2013-10-05 DIAGNOSIS — I482 Chronic atrial fibrillation, unspecified: Secondary | ICD-10-CM

## 2013-10-05 LAB — MDC_IDC_ENUM_SESS_TYPE_INCLINIC
Brady Statistic RA Percent Paced: 0 %
Brady Statistic RV Percent Paced: 93 %
Date Time Interrogation Session: 20150114154818
HighPow Impedance: 39.1275
Implantable Pulse Generator Serial Number: 595859
Lead Channel Pacing Threshold Amplitude: 0.5 V
Lead Channel Pacing Threshold Amplitude: 0.75 V
Lead Channel Pacing Threshold Pulse Width: 0.6 ms
Lead Channel Pacing Threshold Pulse Width: 0.6 ms
Lead Channel Pacing Threshold Pulse Width: 1 ms
Lead Channel Sensing Intrinsic Amplitude: 12 mV
Lead Channel Setting Pacing Amplitude: 2 V
Lead Channel Setting Pacing Amplitude: 2.5 V
Lead Channel Setting Pacing Pulse Width: 0.6 ms
Lead Channel Setting Pacing Pulse Width: 1 ms
Lead Channel Setting Sensing Sensitivity: 0.5 mV
MDC IDC MSMT LEADCHNL LV IMPEDANCE VALUE: 375 Ohm
MDC IDC MSMT LEADCHNL RV IMPEDANCE VALUE: 375 Ohm
MDC IDC MSMT LEADCHNL RV PACING THRESHOLD AMPLITUDE: 0.75 V
MDC IDC SET ZONE DETECTION INTERVAL: 250 ms
MDC IDC SET ZONE DETECTION INTERVAL: 300 ms

## 2013-10-05 MED ORDER — FUROSEMIDE 80 MG PO TABS: 80.0000 mg | ORAL_TABLET | Freq: Two times a day (BID) | ORAL | Status: DC

## 2013-10-05 NOTE — Telephone Encounter (Signed)
Reviewed findings. Evidence of congestive heart failure noted. I do not know this patient well, but reviewed his chart and see that he has a significant cardiomyopathy and chronic kidney disease with creatinine greater than 3 mg/dL. His medications were reviewed. Recommend increasing his furosemide to 80 mg twice daily in bringing him in for an office visit within the next week if possible.  Sherren Mocha 10/05/2013 6:25 PM

## 2013-10-05 NOTE — Progress Notes (Signed)
CRT-D device check in office. Thresholds and sensing consistent with previous device measurements. Lead impedance trends stable over time. Permanent AF + Warfarin. No ventricular arrhythmia episodes recorded. Patient bi-ventricularly pacing 93% of the time. Device programmed with appropriate safety margins. Heart failure diagnostics reviewed and trends are unstable for patient---patient with symptoms--Urgent staff message sent to Dr.Cooper upon request.  Base rate decreased to 70bpm. Estimated longevity 3.3 years. Patient enrolled in remote follow up. Plan to check device remotely on 3-17 (cellular adaptor given). Patient to follow up with SK in December 2015.

## 2013-10-05 NOTE — Telephone Encounter (Signed)
Spoke with patient who states he is feeling the same as he did when in our office earlier for his device clinic appointment.  I advised patient to increase Lasix to 80 mg BID and scheduled him to see Truitt Merle, NP on Monday 1/15.  I advised patient that if symptoms worsen including increased SOB, Swelling, dyspnea, or any other concerns to call the office or if it is during the weekend to go to the hospital.  Patient states he does not believe that is going to be necessary but was agreeable to plan.

## 2013-10-10 ENCOUNTER — Encounter: Payer: Self-pay | Admitting: Nurse Practitioner

## 2013-10-10 ENCOUNTER — Ambulatory Visit (INDEPENDENT_AMBULATORY_CARE_PROVIDER_SITE_OTHER): Payer: Medicare Other | Admitting: *Deleted

## 2013-10-10 ENCOUNTER — Ambulatory Visit (INDEPENDENT_AMBULATORY_CARE_PROVIDER_SITE_OTHER): Payer: Medicare Other | Admitting: Nurse Practitioner

## 2013-10-10 VITALS — BP 150/80 | HR 72 | Ht 72.0 in | Wt 195.4 lb

## 2013-10-10 DIAGNOSIS — Z9889 Other specified postprocedural states: Secondary | ICD-10-CM

## 2013-10-10 DIAGNOSIS — I5022 Chronic systolic (congestive) heart failure: Secondary | ICD-10-CM

## 2013-10-10 DIAGNOSIS — I4891 Unspecified atrial fibrillation: Secondary | ICD-10-CM

## 2013-10-10 DIAGNOSIS — I509 Heart failure, unspecified: Secondary | ICD-10-CM

## 2013-10-10 DIAGNOSIS — I482 Chronic atrial fibrillation, unspecified: Secondary | ICD-10-CM

## 2013-10-10 DIAGNOSIS — I428 Other cardiomyopathies: Secondary | ICD-10-CM

## 2013-10-10 MED ORDER — CALCITRIOL 0.25 MCG PO CAPS: 0.2500 ug | ORAL_CAPSULE | ORAL | Status: DC

## 2013-10-10 MED ORDER — FUROSEMIDE 80 MG PO TABS: 80.0000 mg | ORAL_TABLET | Freq: Two times a day (BID) | ORAL | Status: DC

## 2013-10-10 MED ORDER — HYDRALAZINE HCL 25 MG PO TABS: 37.5000 mg | ORAL_TABLET | Freq: Three times a day (TID) | ORAL | Status: DC

## 2013-10-10 NOTE — Progress Notes (Signed)
Rozanna Box. Date of Birth: May 21, 1931 Medical Record #[id number]  History of Present Illness: Mr. Mathey is seen back today for a work in visit. Seen for Dr. Burt Knack. He has chronic atrial fib, chronic coumadin therapy, chronic systolic HF, milxed ICM/NICM, has CRT-D in place with past AV nodal ablation, CAD with past LAD stent in 2009, CKD stage IV (has fistula in place), DM, colon cancer, HTN, HLD, OSA, and pulmonary HTN.   Last seen at the end of December after being in the hospital with AF with RVR - had an AV node ablation and did well.   Called last week with increasing dyspnea - Lasix was increased.   Comes back today. Here alone. Quite frustrated and upset about his care, his appointments, etc. Has not gotten recall letters from December 2014 for Dr. [name] and never put in the system for Dr. Burt Knack. Thought he was going to see Dr. Burt Knack. Former patient of Dr. Maren Beach. He was here to get his device turned down as part of the ablation protocol last week - noted to have heart failure diagnostics that appeared to be unstable. He was advised to increase his Lasix to BID. His weight has not really increased that much per his scales - he feels better around 192. He currently is not short of breath. No swelling. Some early satiety. Tells me that he was put on lisinopril for his blood sugar - quite adamant about that fact. He has stage 4 CKD. He tries to not use much salt.   Current Outpatient Prescriptions  Medication Sig Dispense Refill  . aspirin 81 MG chewable tablet Chew 81 mg by mouth daily.      . digoxin (LANOXIN) 0.125 MG tablet Take 1 tablet (0.125 mg total) by mouth every other day.      . furosemide (LASIX) 80 MG tablet Take 1 tablet (80 mg total) by mouth 2 (two) times daily.  90 tablet  3  . glucose blood (FREESTYLE TEST STRIPS) test strip 1 each by Other route as needed. Use as instructed       . hydrALAZINE (APRESOLINE) 25 MG tablet Take 25 mg by mouth 3 (three) times  daily.      . insulin lispro protamine-lispro (HUMALOG 75/25) (75-25) 100 UNIT/ML SUSP injection Inject 45 Units into the skin 2 (two) times daily with a meal.      . isosorbide mononitrate (IMDUR) 30 MG 24 hr tablet Take 0.5 tablets (15 mg total) by mouth daily.  90 tablet  3  . lisinopril (PRINIVIL,ZESTRIL) 20 MG tablet Take 0.5 tablets (10 mg total) by mouth daily.  90 tablet  3  . meclizine (ANTIVERT) 25 MG tablet Take 1 tablet (25 mg total) by mouth 3 (three) times daily as needed for dizziness or nausea.  30 tablet  0  . metoprolol succinate (TOPROL-XL) 100 MG 24 hr tablet Take 100 mg by mouth 2 (two) times daily. Take with or immediately following a meal.      . nitroGLYCERIN (NITROSTAT) 0.4 MG SL tablet Place 0.4 mg under the tongue every 5 (five) minutes x 3 doses as needed for chest pain.      . potassium chloride SA (K-DUR,KLOR-CON) 20 MEQ tablet Take 20 mEq by mouth daily.       [name] warfarin (COUMADIN) 5 MG tablet Take 2.5-5 mg by mouth daily. Monday, Wednesday, Friday, takes 2.5 mg; Tuesday, Thursday, Saturday, Sunday takes 5 mg       No current facility-administered  medications for this visit.    Allergies  Allergen Reactions  . Prednisone Other (See Comments)    Raised blood sugar    Past Medical History  Diagnosis Date  . Chronic atrial fibrillation     a. Historically difficult rate control. b. s/p CRT-D implantation with anticipation of AV junction ablation but patient then was able to accomplish rate control and ablation not undertaken.; AVN ablation 08/2013 by Dr Lovena Le  . CARDIOMYOPATHY, PRIMARY, DILATED     a. Mixed ICM/NICM - EF 15% by echo 05/2009; St. Jude CRT-D implanted 06/2009. b. Echo 04/2013 - EF 20%, mild LVH.  [name] COLON CANCER, HX OF     a. s/p partial colectomy.  . Chronic systolic CHF (congestive heart failure)   . CORONARY ARTERY DISEASE     a. BMS to LAD 04/2008. b. Cath 06/2009: nonobstructive disease.  [name] PROSTATE CANCER, HX OF     a. s/p radical  prostatectomy.  [name] SKIN CANCER, HX OF   . SLEEP APNEA   . Hypertension   . Hyperlipidemia   . Diabetes mellitus   . Osteoarthritis   . Tubular adenoma of colon   . Secondary hyperparathyroidism   . CKD (chronic kidney disease), stage IV     a. Right brachiocephalic AV fistula [id number]. Neph = Dunham.  . Vertigo   . Valvular heart disease     a. Echo 04/2013: mild AI, mild MR.  . Pulmonary HTN     a. PA pressure 89mmHg by echo 05/01/13.    Past Surgical History  Procedure Laterality Date  . Colectomy    . Prostatectomy    . Penile prosthesis placement    . Removed prosthetic eye    . Ptca      stent placed  . Total knee arthroplasty  2008    right  . Pacemaker insertion  2010  . Av fistula placement  03/30/2012    Procedure: ARTERIOVENOUS (AV) FISTULA CREATION;  Surgeon: Angelia Mould, MD;  Location: [facility];  Service: Vascular;  Laterality: Right;  Creation of BrachioCephalic Fistula Right arm  . Eye surgery      left eye prothesis  . Fracture surgery      collar bone, right knee replacement  . Bi-ventricular implantable cardioverter defibrillator  (crt-d)  2010    STJ CRTD implanted by Dr [name]  . Ablation  08/2013    AVN ablation by Dr Lovena Le    History  Smoking status  . Former Smoker  . Types: Cigarettes  . Quit date: 09/22/1966  Smokeless tobacco  . Never Used    History  Alcohol Use  . 0 - .5 oz/week  . 0-1 drink(s) per week    Comment: occasional    Family History  Problem Relation Age of Onset  . Heart disease Father   . Throat cancer Father   . Throat cancer Mother   . Heart disease Mother   . Hypertension Mother     Review of Systems: The review of systems is per the HPI.  All other systems were reviewed and are negative.  Physical Exam: BP 150/80  Pulse 72  Ht 6' (1.829 m)  Wt 195 lb 6.4 oz (88.633 kg)  BMI 26.50 kg/m2  SpO2 98% Patient is alert and in no acute distress. Quite frustrated. Skin is warm and dry. Color is normal.   HEENT is unremarkable but with poor teeth - several missing. Normocephalic/atraumatic. PERRL. Sclera are nonicteric. Neck is supple. No masses. No  JVD. Lungs are clear. Cardiac exam shows a regular rate and rhythm. Abdomen is soft. Extremities are without edema. Gait and ROM are intact. No gross neurologic deficits noted.  Wt Readings from Last 3 Encounters:  10/10/13 195 lb 6.4 oz (88.633 kg)  09/20/13 193 lb 6.4 oz (87.726 kg)  09/16/13 181 lb (82.101 kg)    LABORATORY DATA:  Lab Results  Component Value Date   WBC 9.0 09/13/2013   HGB 14.8 09/13/2013   HCT 43.8 09/13/2013   PLT 165 09/13/2013   GLUCOSE 142* 09/30/2013   CHOL 149 05/01/2013   TRIG 201* 05/01/2013   HDL 28* 05/01/2013   LDLDIRECT 102.7 08/03/2012   LDLCALC 81 05/01/2013   ALT 16 09/10/2013   AST 23 09/10/2013   NA 144 09/30/2013   K 4.2 09/30/2013   CL 109 09/30/2013   CREATININE 3.4* 09/30/2013   BUN 52* 09/30/2013   CO2 27 09/30/2013   TSH 0.83 08/08/2013   INR 1.40 09/13/2013   HGBA1C 7.4* 09/10/2013   MICROALBUR 15.4* 08/03/2012   Echo Study Conclusions from December 2014  - Left ventricle: The cavity size was normal. There was mild concentric hypertrophy. Systolic function was severely reduced. The estimated ejection fraction was in the range of 20% to 25%. Wall motion was normal; there were no regional wall motion abnormalities. Doppler parameters are consistent with elevated ventricular end-diastolic filling pressure. - Aortic valve: Mild regurgitation. - Left atrium: The atrium was moderately to severely dilated. - Right ventricle: The cavity size was mildly dilated. Systolic function was moderately reduced. Systolic pressure was increased. - Right atrium: The atrium was moderately dilated. - Atrial septum: No defect or patent foramen ovale was identified. - Pulmonary arteries: Systolic pressure was mildly increased. PA peak pressure: 61mm Hg (S).   Assessment / Plan: 1. Chronic atrial fib - recent  ablation - device is checked today - biV pacing 90%.   2. Chronic systolic HF - device is checked - thoracic impedence down - I have left him on his current regimen. He is feeling better at this time.  I am stopping his ACE and increasing his hydralazine. He is already on nitrate therapy.   3. HTN - Hydralazine increased today  4. CKD - I do not think ACE is a good idea for him at this time given his stage 4 CKD - recheck his labs today. He is followed by Dr. Lorrene Reid.   5. DM - he is quite adamant that Lisinopril was in the place of Glipizide - I have tried to explain this to him that that is not correct - will alert Dr. Leanne Chang and defer his diabetic management to Dr. Leanne Chang.   Patient is agreeable to this plan and will call if any problems develop in the interim.   Burtis Junes, RN, [facility] [address] [place], [place]  [id number] [phone]

## 2013-10-10 NOTE — Patient Instructions (Addendum)
Stay on the Lasix two times a day - you may cut back to one pill if your weight falls below 190 - I have sent in a refill to your mail order.   We will check lab today  Stop the Lisinopril - I think this will only hurt your kidney function.   I will send my note to Dr. Leanne Chang about our visit today  Increase the Hydralazine to 37.5 mg three times a day - this is a pill and a half - this was sent to your mail order.   See Dr. Burt Knack on February 4th (Wednesday) at 3:45 pm  You will need to see Dr. [name] in December of this year.   Continue to weigh daily and avoid salt.   Call the [place] office at [phone] if you have any questions, problems or concerns.

## 2013-10-11 LAB — MDC_IDC_ENUM_SESS_TYPE_INCLINIC
Battery Remaining Longevity: 42 mo
Brady Statistic RA Percent Paced: 0 %
Brady Statistic RV Percent Paced: 90 %
Date Time Interrogation Session: 20150119161644
HighPow Impedance: 38.8075
Implantable Pulse Generator Serial Number: 595859
Lead Channel Impedance Value: 375 Ohm
Lead Channel Pacing Threshold Pulse Width: 1 ms
Lead Channel Sensing Intrinsic Amplitude: 11.8 mV
Lead Channel Setting Pacing Amplitude: 2 V
Lead Channel Setting Sensing Sensitivity: 0.5 mV
MDC IDC MSMT LEADCHNL LV PACING THRESHOLD AMPLITUDE: 0.5 V
MDC IDC MSMT LEADCHNL RV IMPEDANCE VALUE: 375 Ohm
MDC IDC MSMT LEADCHNL RV PACING THRESHOLD AMPLITUDE: 0.875 V
MDC IDC MSMT LEADCHNL RV PACING THRESHOLD PULSEWIDTH: 0.6 ms
MDC IDC SET LEADCHNL LV PACING PULSEWIDTH: 1 ms
MDC IDC SET LEADCHNL RV PACING AMPLITUDE: 2.5 V
MDC IDC SET LEADCHNL RV PACING PULSEWIDTH: 0.6 ms
MDC IDC SET ZONE DETECTION INTERVAL: 300 ms
Zone Setting Detection Interval: 250 ms

## 2013-10-11 LAB — BASIC METABOLIC PANEL
BUN: 52 mg/dL — ABNORMAL HIGH (ref 6–23)
CO2: 27 mEq/L (ref 19–32)
Calcium: 9 mg/dL (ref 8.4–10.5)
Chloride: 106 mEq/L (ref 96–112)
Creatinine, Ser: 3.3 mg/dL — ABNORMAL HIGH (ref 0.4–1.5)
GFR: 19.17 mL/min — ABNORMAL LOW (ref >60.00)
Glucose, Bld: 106 mg/dL — ABNORMAL HIGH (ref 70–99)
Potassium: 3.3 mEq/L — ABNORMAL LOW (ref 3.5–5.1)
Sodium: 143 mEq/L (ref 135–145)

## 2013-10-11 NOTE — Progress Notes (Signed)
[place] check per Remer Macho, NP. Patient's Lasix was adjusted after his last ov. He states that he feels a lot better and his thoracic impedance measurement has improved as well. Patient is currently BiV pacing 90%. No further testing performed this office visit. Patient will follow up remotely as scheduled and with [facility] in December 2015.

## 2013-10-11 NOTE — Addendum Note (Signed)
Addended by: Shiela Mayer on: 10/11/2013 02:58 PM   Modules accepted: Level of Service

## 2013-10-14 ENCOUNTER — Telehealth (HOSPITAL_COMMUNITY): Payer: Self-pay | Admitting: Cardiac Rehabilitation

## 2013-10-14 NOTE — Telephone Encounter (Signed)
Pt contacted to enroll in cardiac rehab services. Pt declined offer.  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

## 2013-10-20 ENCOUNTER — Ambulatory Visit (INDEPENDENT_AMBULATORY_CARE_PROVIDER_SITE_OTHER): Payer: Medicare Other | Admitting: General Practice

## 2013-10-20 DIAGNOSIS — Z5181 Encounter for therapeutic drug level monitoring: Secondary | ICD-10-CM

## 2013-10-20 DIAGNOSIS — I4891 Unspecified atrial fibrillation: Secondary | ICD-10-CM | POA: Insufficient documentation

## 2013-10-20 DIAGNOSIS — I482 Chronic atrial fibrillation, unspecified: Secondary | ICD-10-CM

## 2013-10-20 LAB — POCT INR: INR: 1.5

## 2013-10-20 NOTE — Progress Notes (Signed)
Pre-visit discussion using our clinic review tool. No additional management support is needed unless otherwise documented below in the visit note.  

## 2013-10-21 ENCOUNTER — Telehealth: Payer: Self-pay | Admitting: Cardiovascular Disease

## 2013-10-21 NOTE — Telephone Encounter (Signed)
Pt. Is aware of Dr. Antionette Char recommendations to go to the ER if he feels like he needs to be seen right away and  is hard for him to breath. Pt is aware also  that  MD can seen him next week if he feels like he can wait. Pt has an appointment on 10/26/13 at 3:45 Pm. Pt verbalized understanding.

## 2013-10-21 NOTE — Telephone Encounter (Signed)
Pt. Had an ablation on December 21/14. A week ago (1/19)he was seen by Truitt Merle NP post ablation and SOB. Pt states the SOB got better after this appointment, but gradually was getting worse; now he gets very SOB  With mild activity. Last night pt had to sleep on a recliner to be able to sleep. Pt is not taking his BP because he was on A-Fib and the reading were not accurate and made him very nervous.  PT IS TAKING ALL THE MEDICATIONS AND AVOID salty food.

## 2013-10-21 NOTE — Telephone Encounter (Signed)
New problem ° ° ° °Pt having SOB.. °

## 2013-10-25 ENCOUNTER — Other Ambulatory Visit: Payer: Self-pay | Admitting: *Deleted

## 2013-10-25 MED ORDER — FUROSEMIDE 80 MG PO TABS: 80.0000 mg | ORAL_TABLET | Freq: Two times a day (BID) | ORAL | Status: DC

## 2013-10-26 ENCOUNTER — Ambulatory Visit (INDEPENDENT_AMBULATORY_CARE_PROVIDER_SITE_OTHER): Payer: Medicare Other | Admitting: Cardiovascular Disease

## 2013-10-26 ENCOUNTER — Encounter: Payer: Self-pay | Admitting: *Deleted

## 2013-10-26 ENCOUNTER — Encounter: Payer: Self-pay | Admitting: Cardiovascular Disease

## 2013-10-26 VITALS — BP 140/62 | HR 70 | Ht 72.0 in | Wt 195.0 lb

## 2013-10-26 DIAGNOSIS — I5022 Chronic systolic (congestive) heart failure: Secondary | ICD-10-CM

## 2013-10-26 NOTE — Patient Instructions (Signed)
Your physician wants you to follow-up in: 4 months with Dr. Cooper You will receive a reminder letter in the mail two months in advance. If you don't receive a letter, please call our office to schedule the follow-up appointment.  Your physician recommends that you continue on your current medications as directed. Please refer to the Current Medication list given to you today.  

## 2013-10-28 ENCOUNTER — Other Ambulatory Visit (INDEPENDENT_AMBULATORY_CARE_PROVIDER_SITE_OTHER): Payer: Medicare Other

## 2013-10-28 DIAGNOSIS — I10 Essential (primary) hypertension: Secondary | ICD-10-CM

## 2013-10-28 DIAGNOSIS — E1165 Type 2 diabetes mellitus with hyperglycemia: Secondary | ICD-10-CM

## 2013-10-28 DIAGNOSIS — E785 Hyperlipidemia, unspecified: Secondary | ICD-10-CM

## 2013-10-28 DIAGNOSIS — IMO0001 Reserved for inherently not codable concepts without codable children: Secondary | ICD-10-CM

## 2013-10-28 LAB — LIPID PANEL
CHOLESTEROL: 139 mg/dL (ref 0–200)
HDL: 37 mg/dL — ABNORMAL LOW (ref >39.00)
LDL Cholesterol: 79 mg/dL (ref 0–99)
Total CHOL/HDL Ratio: 4
Triglycerides: 115 mg/dL (ref 0.0–149.0)
VLDL: 23 mg/dL (ref 0.0–40.0)

## 2013-10-28 LAB — HEPATIC FUNCTION PANEL
ALBUMIN: 3.7 g/dL (ref 3.5–5.2)
ALT: 32 U/L (ref 0–53)
AST: 30 U/L (ref 0–37)
Alkaline Phosphatase: 34 U/L — ABNORMAL LOW (ref 39–117)
Bilirubin, Direct: 0.1 mg/dL (ref 0.0–0.3)
Total Bilirubin: 1.1 mg/dL (ref 0.3–1.2)
Total Protein: 6.6 g/dL (ref 6.0–8.3)

## 2013-10-28 LAB — BASIC METABOLIC PANEL
BUN: 57 mg/dL — AB (ref 6–23)
CHLORIDE: 106 meq/L (ref 96–112)
CO2: 28 mEq/L (ref 19–32)
Calcium: 9.3 mg/dL (ref 8.4–10.5)
Creatinine, Ser: 3.4 mg/dL — ABNORMAL HIGH (ref 0.4–1.5)
GFR: 18.84 mL/min — ABNORMAL LOW (ref >60.00)
GLUCOSE: 102 mg/dL — AB (ref 70–99)
Potassium: 3.9 mEq/L (ref 3.5–5.1)
Sodium: 142 mEq/L (ref 135–145)

## 2013-10-28 NOTE — Addendum Note (Signed)
Addended by: Elmer Picker on: 10/28/2013 09:26 AM   Modules accepted: Orders

## 2013-10-28 NOTE — Addendum Note (Signed)
Addended by: Elmer Picker on: 10/28/2013 09:01 AM   Modules accepted: Orders

## 2013-10-30 NOTE — Progress Notes (Signed)
HPI:  80 year-old male presenting for follow-up evaluation. He's followed for chronic systolic heart failure, atrial fibrillation, Stage 4 CKD, and CAD (LAD stent 2009). His cardiomyopathy has been considered out-of-proportion to the extent of his CAD (NICM). He underwent AVN ablation December 2014. He has undergone CRT-D.   The patient has had a difficult time over recent months, but symptoms have stabilized per his report. He states weights are stable on his home scales. Shortness of breath with activity is also stable and occurs with low-level to moderate activity. No chest pain, lightheadedness, palpitations, or syncope. He is tolerating chronic anticoagulation without bleeding problems.   Outpatient Encounter Prescriptions as of 10/26/2013  Medication Sig  . aspirin 81 MG chewable tablet Chew 81 mg by mouth daily.  . calcitRIOL (ROCALTROL) 0.25 MCG capsule Take 1 capsule (0.25 mcg total) by mouth as directed. Taking only on Monday and Thursday  . digoxin (LANOXIN) 0.125 MG tablet Take 1 tablet (0.125 mg total) by mouth every other day.  . furosemide (LASIX) 80 MG tablet Take 1 tablet (80 mg total) by mouth 2 (two) times daily.  [name] glucose blood (FREESTYLE TEST STRIPS) test strip 1 each by Other route as needed. Use as instructed   . hydrALAZINE (APRESOLINE) 25 MG tablet Take 1.5 tablets (37.5 mg total) by mouth 3 (three) times daily.  . insulin lispro protamine-lispro (HUMALOG 75/25) (75-25) 100 UNIT/ML SUSP injection Inject 45 Units into the skin 2 (two) times daily with a meal.  . isosorbide mononitrate (IMDUR) 30 MG 24 hr tablet Take 0.5 tablets (15 mg total) by mouth daily.  . meclizine (ANTIVERT) 25 MG tablet Take 1 tablet (25 mg total) by mouth 3 (three) times daily as needed for dizziness or nausea.  . metoprolol succinate (TOPROL-XL) 100 MG 24 hr tablet Take 100 mg by mouth 2 (two) times daily. Take with or immediately following a meal.  . nitroGLYCERIN (NITROSTAT) 0.4 MG SL tablet  Place 0.4 mg under the tongue every 5 (five) minutes x 3 doses as needed for chest pain.  . potassium chloride SA (K-DUR,KLOR-CON) 20 MEQ tablet Take 20 mEq by mouth daily.   [name] warfarin (COUMADIN) 5 MG tablet Take 2.5-5 mg by mouth daily. Monday, Wednesday, Friday, takes 2.5 mg; Tuesday, Thursday, Saturday, Sunday takes 5 mg    Allergies  Allergen Reactions  . Prednisone Other (See Comments)    Raised blood sugar    Past Medical History  Diagnosis Date  . Chronic atrial fibrillation     a. Historically difficult rate control. b. s/p CRT-D implantation with anticipation of AV junction ablation but patient then was able to accomplish rate control and ablation not undertaken.; AVN ablation 08/2013 by Dr Lovena Le  . CARDIOMYOPATHY, PRIMARY, DILATED     a. Mixed ICM/NICM - EF 15% by echo 05/2009; St[employer] Jude CRT-D implanted 06/2009. b. Echo 04/2013 - EF 20%, mild LVH.  [name] COLON CANCER, HX OF     a. s/p partial colectomy.  . Chronic systolic CHF (congestive heart failure)   . CORONARY ARTERY DISEASE     a. BMS to LAD 04/2008. b. Cath 06/2009: nonobstructive disease.  [name] PROSTATE CANCER, HX OF     a. s/p radical prostatectomy.  [name] SKIN CANCER, HX OF   . SLEEP APNEA   . Hypertension   . Hyperlipidemia   . Diabetes mellitus   . Osteoarthritis   . Tubular adenoma of colon   . Secondary hyperparathyroidism   . CKD (chronic kidney  disease), stage IV     a. Right brachiocephalic AV fistula 09/9620. Neph = Dunham.  . Vertigo   . Valvular heart disease     a. Echo 04/2013: mild AI, mild MR.  . Pulmonary HTN     a. PA pressure 54mmHg by echo 05/01/13.    ROS: Negative except as per HPI  BP 140/62  Pulse 70  Ht 6' (1.829 m)  Wt 195 lb (88.451 kg)  BMI 26.44 kg/m2  SpO2 99%  PHYSICAL EXAM: Pt is alert and oriented, elderly gentleman in NAD HEENT: normal Neck: JVP - moderately elevated, carotids 2+= without bruits Lungs: CTA bilaterally CV: RRR with grade 2/6 holosystolic murmur at the  apex Abd: soft, NT, Positive BS, no hepatomegaly Ext: no C/C/E, distal pulses intact and equal Skin: warm/dry no rash  2D Echo 09/11/2013: Study Conclusions  - Left ventricle: The cavity size was normal. There was mild concentric hypertrophy. Systolic function was severely reduced. The estimated ejection fraction was in the range of 20% to 25%. Wall motion was normal; there were no regional wall motion abnormalities. Doppler parameters are consistent with elevated ventricular end-diastolic filling pressure. - Aortic valve: Mild regurgitation. - Left atrium: The atrium was moderately to severely dilated. - Right ventricle: The cavity size was mildly dilated. Systolic function was moderately reduced. Systolic pressure was increased. - Right atrium: The atrium was moderately dilated. - Atrial septum: No defect or patent foramen ovale was identified. - Pulmonary arteries: Systolic pressure was mildly increased. PA peak pressure: 96mm Hg (S).  ASSESSMENT AND PLAN: 1. Chronic systolic heart failure, NYHA Class 3. He is s/p CRT-D and now AV node ablation to control atrial fib and improve Bi-V pacing. His medications were reviewed and they are optimized. Not a candidate for ACE-I or ARB because of advanced CKD. Will follow-up in 4 months.  2. Permanent atrial fibrillation: anticoagulated with warfarin. Not a candidate for NOAC because of renal function. S/p AVN ablation.  3. HTN - BP improved after increase in hydralazine dose last month. Continue same med Rx.  Sherren Mocha 10/31/2013 12:00 AM

## 2013-10-31 ENCOUNTER — Encounter: Payer: Self-pay | Admitting: Internal Medicine

## 2013-11-02 ENCOUNTER — Encounter: Payer: Self-pay | Admitting: Podiatry

## 2013-11-02 ENCOUNTER — Ambulatory Visit (INDEPENDENT_AMBULATORY_CARE_PROVIDER_SITE_OTHER): Payer: Medicare Other | Admitting: Podiatry

## 2013-11-02 VITALS — BP 165/88 | HR 70 | Ht 72.0 in | Wt 190.0 lb

## 2013-11-02 DIAGNOSIS — M79609 Pain in unspecified limb: Secondary | ICD-10-CM

## 2013-11-02 DIAGNOSIS — L600 Ingrowing nail: Secondary | ICD-10-CM

## 2013-11-02 DIAGNOSIS — M79673 Pain in unspecified foot: Secondary | ICD-10-CM

## 2013-11-02 DIAGNOSIS — B351 Tinea unguium: Secondary | ICD-10-CM

## 2013-11-02 NOTE — Progress Notes (Signed)
Subjective:  80 y.o. year old male patient presents complaining of painful nail and requesting toe nails trimmed.  Denies any change since last visit except his Cholesterol is at about 160. Objective:  Dermatologic:  Thick dystrophic nails x 9 with missing 4th left.  Painful cryptotic nails both great toes.  Skin: No skin lesions.  Vascular:  Dorsalis pedis pulses palpable on both feet.  Posterior tibial pulses are not palpable on both feet.  No edema or erythema, no ischemic changes noted.  Orthopedic:  Cavus type foot.  Neurologic:  Failed respond to Monofilament sensory testing.  Vibratory and Ankle DTR is within normal bilateral.  Assessment:  Thick ingrown nails on both great toes.  Dystrophic mycotic nails x 9.  Diabetic neuropathy.  Treatment: All mycotic nails and ingrown nails debrided.  Return in 3 months or as needed

## 2013-11-02 NOTE — Patient Instructions (Signed)
Seen for hypertrophic nails. All nails debrided. Return in 3 months or as needed.  

## 2013-11-03 ENCOUNTER — Encounter: Payer: Self-pay | Admitting: Internal Medicine

## 2013-11-03 ENCOUNTER — Ambulatory Visit (INDEPENDENT_AMBULATORY_CARE_PROVIDER_SITE_OTHER): Payer: Medicare Other | Admitting: General Practice

## 2013-11-03 ENCOUNTER — Ambulatory Visit (INDEPENDENT_AMBULATORY_CARE_PROVIDER_SITE_OTHER): Payer: Medicare Other | Admitting: Internal Medicine

## 2013-11-03 VITALS — BP 146/84 | HR 72 | Temp 97.7°F | Ht 72.0 in | Wt 194.0 lb

## 2013-11-03 DIAGNOSIS — Z5181 Encounter for therapeutic drug level monitoring: Secondary | ICD-10-CM

## 2013-11-03 DIAGNOSIS — I4891 Unspecified atrial fibrillation: Secondary | ICD-10-CM

## 2013-11-03 DIAGNOSIS — I509 Heart failure, unspecified: Secondary | ICD-10-CM

## 2013-11-03 DIAGNOSIS — I482 Chronic atrial fibrillation, unspecified: Secondary | ICD-10-CM

## 2013-11-03 LAB — POCT INR: INR: 1.5

## 2013-11-03 NOTE — Progress Notes (Signed)
Pre visit review using our clinic review tool, if applicable. No additional management support is needed unless otherwise documented below in the visit note. 

## 2013-11-03 NOTE — Progress Notes (Signed)
Pre-visit discussion using our clinic review tool. No additional management support is needed unless otherwise documented below in the visit note.  

## 2013-11-03 NOTE — Progress Notes (Signed)
CHF- he is feeling well.   AFIB- now with pacemaker No bleeding complications on warfarin (note INR 1.5)  CAD- no chest pain, SOB, PND  Renal failure- off of ACE-I  Wife in hospice with lung ca  Reviewed meds, pmh, psh Reviewed shx  Ros- no specific complaints   well-developed well-nourished male in no acute distress. HEENT exam atraumatic, normocephalic, neck supple without jugular venous distention. Chest clear to auscultation cardiac exam S1-S2 are regular. Abdominal exam overweight with bowel sounds, soft and nontender. Extremities no edema. Neurologic exam is alert with a normal gait.

## 2013-11-04 ENCOUNTER — Ambulatory Visit: Payer: Medicare Other | Admitting: Internal Medicine

## 2013-11-05 NOTE — Assessment & Plan Note (Signed)
Clinically doing well His heart rate is regular (proably paced) Continue warfarin  Continue same meds

## 2013-11-07 ENCOUNTER — Encounter: Payer: Self-pay | Admitting: Internal Medicine

## 2013-11-10 ENCOUNTER — Ambulatory Visit: Payer: Medicare Other

## 2013-11-21 ENCOUNTER — Ambulatory Visit (INDEPENDENT_AMBULATORY_CARE_PROVIDER_SITE_OTHER): Payer: Medicare Other | Admitting: Family Medicine

## 2013-11-21 ENCOUNTER — Encounter: Payer: Self-pay | Admitting: Family Medicine

## 2013-11-21 VITALS — BP 144/74 | HR 70 | Temp 99.0°F | Resp 18 | Ht 72.0 in | Wt 193.0 lb

## 2013-11-21 DIAGNOSIS — I428 Other cardiomyopathies: Secondary | ICD-10-CM

## 2013-11-21 DIAGNOSIS — J069 Acute upper respiratory infection, unspecified: Secondary | ICD-10-CM

## 2013-11-21 DIAGNOSIS — R059 Cough, unspecified: Secondary | ICD-10-CM

## 2013-11-21 DIAGNOSIS — R05 Cough: Secondary | ICD-10-CM

## 2013-11-21 NOTE — Progress Notes (Signed)
OFFICE NOTE  11/21/2013  CC:  Chief Complaint  Patient presents with  . Cough    x 4 days  . Headache     HPI: Patient is a 80 y.o. Caucasian male who is here for cough. Onset 4 days ago, nonproductive, not improving, starting to "sap my energy some".  Tried throat lozenges to sooth itch in sternal region, robitussin DM and mucinex DM not helpful at all.  No fever, no SOB.  Some runny nose over the last 4d.  No ST.  No wheezing or SOB.  Has some HA after long periods of coughing.  His wt is not going up. He eats low Na diet.  Appetite down some.  Denies GERD. Denies CP.  Pertinent PMH:  PMH and PSH reviewed. 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  MEDS:  Outpatient Prescriptions Prior to Visit  Medication Sig Dispense Refill  . aspirin 81 MG chewable tablet Chew 81 mg by mouth daily.      . calcitRIOL (ROCALTROL) 0.25 MCG capsule Take 1 capsule (0.25 mcg total) by mouth as directed. Taking only on Monday and Thursday      . digoxin (LANOXIN) 0.125 MG tablet Take 1 tablet (0.125 mg total) by mouth every other day.      . furosemide (LASIX) 80 MG tablet Take 1 tablet (80 mg total) by mouth 2 (two) times daily.  180 tablet  2  . glucose blood (FREESTYLE TEST STRIPS) test strip 1 each by Other route as needed. Use as instructed       . hydrALAZINE (APRESOLINE) 25 MG tablet Take 1.5 tablets (37.5 mg total) by mouth 3 (three) times daily.  180 tablet  6  . insulin lispro protamine-lispro (HUMALOG 75/25) (75-25) 100 UNIT/ML SUSP injection Inject 45 Units into the skin 2 (two) times daily with a meal.      . isosorbide mononitrate (IMDUR) 30 MG 24 hr tablet Take 0.5 tablets (15 mg total) by mouth daily.  90 tablet  3  . meclizine (ANTIVERT) 25 MG tablet Take 1 tablet (25 mg total) by mouth 3 (three) times daily as needed for dizziness or nausea.  30 tablet  0  . metoprolol succinate (TOPROL-XL) 100 MG  24 hr tablet Take 100 mg by mouth 2 (two) times daily. Take with or immediately following a meal.      . nitroGLYCERIN (NITROSTAT) 0.4 MG SL tablet Place 0.4 mg under the tongue every 5 (five) minutes x 3 doses as needed for chest pain.      . potassium chloride SA (K-DUR,KLOR-CON) 20 MEQ tablet Take 20 mEq by mouth daily.       [name] warfarin (COUMADIN) 5 MG tablet Take 2.5-5 mg by mouth daily. Monday, Wednesday, Friday, takes 2.5 mg; Tuesday, Thursday, Saturday, Sunday takes 5 mg       No facility-administered medications prior to visit.    PE: Blood pressure 144/74, pulse 70, temperature 99 F (37.2 C), temperature source Temporal, resp. rate 18, height 6' (1.829 m), weight 193 lb (87.544 kg), SpO2 97.00%. VS: noted--normal. Gen: alert, NAD, NONTOXIC APPEARING. HEENT: eyes without injection, drainage, or swelling.  Ears: EACs clear, TMs with normal light reflex and landmarks.  Nose: Scant amount of clear rhinorrhea, with some dried, crusty exudate adherent to nasal mucosa.  No purulent d/c.  No paranasal sinus TTP.  No facial swelling.  Throat and mouth without focal lesion.  No pharyngial swelling, erythema, or exudate.   Neck: supple, no LAD.   LUNGS: CTA bilat, nonlabored resps.   CV: RRR, no m/r/g. EXT: 1+ bilat lower leg pitting edema SKIN: no rash  LABS: none  IMPRESSION AND PLAN:  Cough, most likely from mild viral URI. I see no sign of acute CHF/pulm edema--this appears to be stable. Reassured pt. He may continue current symptomatic care. Signs/symptoms to call or return for were reviewed and pt expressed understanding.  An After Visit Summary was printed and given to the patient.  FOLLOW UP: prn

## 2013-11-21 NOTE — Progress Notes (Signed)
Pre visit review using our clinic review tool, if applicable. No additional management support is needed unless otherwise documented below in the visit note. 

## 2013-11-22 ENCOUNTER — Inpatient Hospital Stay (HOSPITAL_COMMUNITY)
Admission: EM | Admit: 2013-11-22 | Discharge: 2013-11-26 | DRG: 202 | Disposition: A | Discharge status: Home / Self Care | Payer: Medicare Other | Attending: Family Medicine | Admitting: Family Medicine

## 2013-11-22 ENCOUNTER — Other Ambulatory Visit: Payer: Self-pay | Admitting: *Deleted

## 2013-11-22 ENCOUNTER — Telehealth: Payer: Self-pay | Admitting: Internal Medicine

## 2013-11-22 ENCOUNTER — Emergency Department (HOSPITAL_COMMUNITY): Payer: Medicare Other

## 2013-11-22 ENCOUNTER — Encounter (HOSPITAL_COMMUNITY): Payer: Self-pay | Admitting: Emergency Medicine

## 2013-11-22 DIAGNOSIS — Z9079 Acquired absence of other genital organ(s): Secondary | ICD-10-CM

## 2013-11-22 DIAGNOSIS — I5022 Chronic systolic (congestive) heart failure: Secondary | ICD-10-CM | POA: Diagnosis present

## 2013-11-22 DIAGNOSIS — J40 Bronchitis, not specified as acute or chronic: Secondary | ICD-10-CM | POA: Diagnosis present

## 2013-11-22 DIAGNOSIS — Z8249 Family history of ischemic heart disease and other diseases of the circulatory system: Secondary | ICD-10-CM

## 2013-11-22 DIAGNOSIS — Z87891 Personal history of nicotine dependence: Secondary | ICD-10-CM

## 2013-11-22 DIAGNOSIS — I5043 Acute on chronic combined systolic (congestive) and diastolic (congestive) heart failure: Secondary | ICD-10-CM

## 2013-11-22 DIAGNOSIS — R42 Dizziness and giddiness: Secondary | ICD-10-CM

## 2013-11-22 DIAGNOSIS — Z85038 Personal history of other malignant neoplasm of large intestine: Secondary | ICD-10-CM

## 2013-11-22 DIAGNOSIS — J209 Acute bronchitis, unspecified: Principal | ICD-10-CM | POA: Diagnosis present

## 2013-11-22 DIAGNOSIS — Z794 Long term (current) use of insulin: Secondary | ICD-10-CM

## 2013-11-22 DIAGNOSIS — B351 Tinea unguium: Secondary | ICD-10-CM

## 2013-11-22 DIAGNOSIS — R5383 Other fatigue: Secondary | ICD-10-CM

## 2013-11-22 DIAGNOSIS — Z8546 Personal history of malignant neoplasm of prostate: Secondary | ICD-10-CM

## 2013-11-22 DIAGNOSIS — I4891 Unspecified atrial fibrillation: Secondary | ICD-10-CM

## 2013-11-22 DIAGNOSIS — E785 Hyperlipidemia, unspecified: Secondary | ICD-10-CM | POA: Diagnosis present

## 2013-11-22 DIAGNOSIS — Z7982 Long term (current) use of aspirin: Secondary | ICD-10-CM

## 2013-11-22 DIAGNOSIS — Z9049 Acquired absence of other specified parts of digestive tract: Secondary | ICD-10-CM

## 2013-11-22 DIAGNOSIS — M1990 Unspecified osteoarthritis, unspecified site: Secondary | ICD-10-CM

## 2013-11-22 DIAGNOSIS — R079 Chest pain, unspecified: Secondary | ICD-10-CM | POA: Diagnosis present

## 2013-11-22 DIAGNOSIS — Z95810 Presence of automatic (implantable) cardiac defibrillator: Secondary | ICD-10-CM

## 2013-11-22 DIAGNOSIS — I482 Chronic atrial fibrillation, unspecified: Secondary | ICD-10-CM

## 2013-11-22 DIAGNOSIS — Z4502 Encounter for adjustment and management of automatic implantable cardiac defibrillator: Secondary | ICD-10-CM

## 2013-11-22 DIAGNOSIS — N184 Chronic kidney disease, stage 4 (severe): Secondary | ICD-10-CM | POA: Diagnosis present

## 2013-11-22 DIAGNOSIS — Z7901 Long term (current) use of anticoagulants: Secondary | ICD-10-CM

## 2013-11-22 DIAGNOSIS — R630 Anorexia: Secondary | ICD-10-CM | POA: Diagnosis present

## 2013-11-22 DIAGNOSIS — Z85828 Personal history of other malignant neoplasm of skin: Secondary | ICD-10-CM

## 2013-11-22 DIAGNOSIS — I129 Hypertensive chronic kidney disease with stage 1 through stage 4 chronic kidney disease, or unspecified chronic kidney disease: Secondary | ICD-10-CM | POA: Diagnosis present

## 2013-11-22 DIAGNOSIS — R5381 Other malaise: Secondary | ICD-10-CM | POA: Diagnosis present

## 2013-11-22 DIAGNOSIS — R634 Abnormal weight loss: Secondary | ICD-10-CM | POA: Diagnosis present

## 2013-11-22 DIAGNOSIS — Z96659 Presence of unspecified artificial knee joint: Secondary | ICD-10-CM

## 2013-11-22 DIAGNOSIS — I428 Other cardiomyopathies: Secondary | ICD-10-CM | POA: Diagnosis present

## 2013-11-22 DIAGNOSIS — D696 Thrombocytopenia, unspecified: Secondary | ICD-10-CM | POA: Diagnosis present

## 2013-11-22 DIAGNOSIS — IMO0002 Reserved for concepts with insufficient information to code with codable children: Secondary | ICD-10-CM | POA: Diagnosis present

## 2013-11-22 DIAGNOSIS — G4730 Sleep apnea, unspecified: Secondary | ICD-10-CM | POA: Diagnosis present

## 2013-11-22 DIAGNOSIS — R6881 Early satiety: Secondary | ICD-10-CM | POA: Diagnosis present

## 2013-11-22 DIAGNOSIS — E1165 Type 2 diabetes mellitus with hyperglycemia: Secondary | ICD-10-CM | POA: Diagnosis present

## 2013-11-22 DIAGNOSIS — I509 Heart failure, unspecified: Secondary | ICD-10-CM | POA: Diagnosis present

## 2013-11-22 DIAGNOSIS — E119 Type 2 diabetes mellitus without complications: Secondary | ICD-10-CM | POA: Diagnosis present

## 2013-11-22 DIAGNOSIS — I10 Essential (primary) hypertension: Secondary | ICD-10-CM

## 2013-11-22 DIAGNOSIS — Z801 Family history of malignant neoplasm of trachea, bronchus and lung: Secondary | ICD-10-CM

## 2013-11-22 DIAGNOSIS — R0902 Hypoxemia: Secondary | ICD-10-CM | POA: Diagnosis present

## 2013-11-22 DIAGNOSIS — I2510 Atherosclerotic heart disease of native coronary artery without angina pectoris: Secondary | ICD-10-CM | POA: Diagnosis present

## 2013-11-22 DIAGNOSIS — Z5181 Encounter for therapeutic drug level monitoring: Secondary | ICD-10-CM

## 2013-11-22 LAB — COMPREHENSIVE METABOLIC PANEL
ALT: 28 U/L (ref 0–53)
AST: 40 U/L — ABNORMAL HIGH (ref 0–37)
Albumin: 3.4 g/dL — ABNORMAL LOW (ref 3.5–5.2)
Alkaline Phosphatase: 36 U/L — ABNORMAL LOW (ref 39–117)
BUN: 61 mg/dL — ABNORMAL HIGH (ref 6–23)
CALCIUM: 9.2 mg/dL (ref 8.4–10.5)
CO2: 21 mEq/L (ref 19–32)
CREATININE: 3.68 mg/dL — AB (ref 0.50–1.35)
Chloride: 102 mEq/L (ref 96–112)
GFR calc non Af Amer: 14 mL/min — ABNORMAL LOW (ref >90)
GFR, EST AFRICAN AMERICAN: 17 mL/min — AB (ref >90)
GLUCOSE: 262 mg/dL — AB (ref 70–99)
Potassium: 5 mEq/L (ref 3.7–5.3)
SODIUM: 143 meq/L (ref 137–147)
TOTAL PROTEIN: 6.1 g/dL (ref 6.0–8.3)
Total Bilirubin: 1 mg/dL (ref 0.3–1.2)

## 2013-11-22 LAB — CBC WITH DIFFERENTIAL/PLATELET
BASOS PCT: 0 % (ref 0–1)
Basophils Absolute: 0 10*3/uL (ref 0.0–0.1)
EOS ABS: 0 10*3/uL (ref 0.0–0.7)
Eosinophils Relative: 0 % (ref 0–5)
HCT: 42.1 % (ref 39.0–52.0)
Hemoglobin: 13.8 g/dL (ref 13.0–17.0)
Lymphocytes Relative: 8 % — ABNORMAL LOW (ref 12–46)
Lymphs Abs: 0.5 10*3/uL — ABNORMAL LOW (ref 0.7–4.0)
MCH: 31.1 pg (ref 26.0–34.0)
MCHC: 32.8 g/dL (ref 30.0–36.0)
MCV: 94.8 fL (ref 78.0–100.0)
Monocytes Absolute: 0.3 10*3/uL (ref 0.1–1.0)
Monocytes Relative: 5 % (ref 3–12)
NEUTROS PCT: 87 % — AB (ref 43–77)
Neutro Abs: 4.9 10*3/uL (ref 1.7–7.7)
PLATELETS: 108 10*3/uL — AB (ref 150–400)
RBC: 4.44 MIL/uL (ref 4.22–5.81)
RDW: 15 % (ref 11.5–15.5)
WBC: 5.7 10*3/uL (ref 4.0–10.5)

## 2013-11-22 LAB — GLUCOSE, CAPILLARY
GLUCOSE-CAPILLARY: 256 mg/dL — AB (ref 70–99)
GLUCOSE-CAPILLARY: 168 mg/dL — AB (ref 70–99)

## 2013-11-22 LAB — PROTIME-INR
INR: 3.16 — ABNORMAL HIGH (ref 0.00–1.49)
PROTHROMBIN TIME: 31.3 s — AB (ref 11.6–15.2)

## 2013-11-22 LAB — DIGOXIN LEVEL: DIGOXIN LVL: 0.4 ng/mL — AB (ref 0.8–2.0)

## 2013-11-22 LAB — INFLUENZA PANEL BY PCR (TYPE A & B)
H1N1 flu by pcr: NOT DETECTED
INFLAPCR: NEGATIVE
Influenza B By PCR: NEGATIVE

## 2013-11-22 LAB — TROPONIN I

## 2013-11-22 LAB — PRO B NATRIURETIC PEPTIDE: Pro B Natriuretic peptide (BNP): 17489 pg/mL — ABNORMAL HIGH (ref 0–450)

## 2013-11-22 MED ORDER — AMOXICILLIN 500 MG PO CAPS: 500.0000 mg | ORAL_CAPSULE | Freq: Three times a day (TID) | ORAL | Status: DC

## 2013-11-22 MED ORDER — INSULIN ASPART 100 UNIT/ML ~~LOC~~ SOLN
0.0000 [IU] | Freq: Three times a day (TID) | SUBCUTANEOUS | Status: DC
  Administered 2013-11-22: 5 [IU] via SUBCUTANEOUS
  Administered 2013-11-23: 1 [IU] via SUBCUTANEOUS
  Administered 2013-11-23 – 2013-11-24 (×2): 2 [IU] via SUBCUTANEOUS
  Administered 2013-11-24 – 2013-11-25 (×2): 3 [IU] via SUBCUTANEOUS
  Administered 2013-11-25 – 2013-11-26 (×2): 2 [IU] via SUBCUTANEOUS
  Administered 2013-11-26: 1 [IU] via SUBCUTANEOUS

## 2013-11-22 MED ORDER — POTASSIUM CHLORIDE CRYS ER 20 MEQ PO TBCR
20.0000 meq | EXTENDED_RELEASE_TABLET | Freq: Two times a day (BID) | ORAL | Status: DC
  Administered 2013-11-22 – 2013-11-26 (×8): 20 meq via ORAL
  Filled 2013-11-22 (×9): qty 1

## 2013-11-22 MED ORDER — SODIUM CHLORIDE 0.9 % IJ SOLN: 3.0000 mL | INTRAMUSCULAR | Status: DC | PRN

## 2013-11-22 MED ORDER — SODIUM CHLORIDE 0.9 % IV SOLN: 250.0000 mL | INTRAVENOUS | Status: DC | PRN

## 2013-11-22 MED ORDER — DIGOXIN 125 MCG PO TABS
0.1250 mg | ORAL_TABLET | ORAL | Status: DC
  Administered 2013-11-22 – 2013-11-26 (×3): 0.125 mg via ORAL
  Filled 2013-11-22 (×3): qty 1

## 2013-11-22 MED ORDER — ALBUTEROL SULFATE (2.5 MG/3ML) 0.083% IN NEBU
5.0000 mg | INHALATION_SOLUTION | Freq: Once | RESPIRATORY_TRACT | Status: AC
  Administered 2013-11-22: 5 mg via RESPIRATORY_TRACT
  Filled 2013-11-22: qty 6

## 2013-11-22 MED ORDER — HYDROCODONE-ACETAMINOPHEN 5-325 MG PO TABS
1.0000 | ORAL_TABLET | ORAL | Status: DC | PRN
  Filled 2013-11-22: qty 1

## 2013-11-22 MED ORDER — IPRATROPIUM-ALBUTEROL 0.5-2.5 (3) MG/3ML IN SOLN
3.0000 mL | Freq: Four times a day (QID) | RESPIRATORY_TRACT | Status: DC
  Administered 2013-11-22 – 2013-11-23 (×6): 3 mL via RESPIRATORY_TRACT
  Filled 2013-11-22 (×6): qty 3

## 2013-11-22 MED ORDER — SODIUM CHLORIDE 0.9 % IJ SOLN
3.0000 mL | Freq: Two times a day (BID) | INTRAMUSCULAR | Status: DC
  Administered 2013-11-23 – 2013-11-26 (×7): 3 mL via INTRAVENOUS

## 2013-11-22 MED ORDER — MECLIZINE HCL 25 MG PO TABS
25.0000 mg | ORAL_TABLET | Freq: Three times a day (TID) | ORAL | Status: DC | PRN
  Filled 2013-11-22: qty 1

## 2013-11-22 MED ORDER — DM-GUAIFENESIN ER 30-600 MG PO TB12
1.0000 | ORAL_TABLET | Freq: Two times a day (BID) | ORAL | Status: DC
  Administered 2013-11-22 – 2013-11-26 (×8): 1 via ORAL
  Filled 2013-11-22 (×9): qty 1

## 2013-11-22 MED ORDER — SODIUM CHLORIDE 0.9 % IV SOLN
INTRAVENOUS | Status: DC
  Administered 2013-11-22: 17:00:00 via INTRAVENOUS

## 2013-11-22 MED ORDER — HYDRALAZINE HCL 25 MG PO TABS
37.5000 mg | ORAL_TABLET | Freq: Three times a day (TID) | ORAL | Status: DC
Start: 2013-11-22 — End: 2013-11-26
  Administered 2013-11-22 – 2013-11-26 (×12): 37.5 mg via ORAL
  Filled 2013-11-22 (×15): qty 1.5

## 2013-11-22 MED ORDER — INSULIN ASPART PROT & ASPART (70-30 MIX) 100 UNIT/ML ~~LOC~~ SUSP
45.0000 [IU] | Freq: Two times a day (BID) | SUBCUTANEOUS | Status: DC
  Administered 2013-11-22 – 2013-11-23 (×3): 45 [IU] via SUBCUTANEOUS
  Filled 2013-11-22: qty 10

## 2013-11-22 MED ORDER — ISOSORBIDE MONONITRATE 15 MG HALF TABLET
15.0000 mg | ORAL_TABLET | Freq: Every day | ORAL | Status: DC
  Administered 2013-11-22 – 2013-11-26 (×5): 15 mg via ORAL
  Filled 2013-11-22 (×5): qty 1

## 2013-11-22 MED ORDER — LEVOFLOXACIN IN D5W 500 MG/100ML IV SOLN
500.0000 mg | Freq: Once | INTRAVENOUS | Status: DC
  Administered 2013-11-22: 500 mg via INTRAVENOUS
  Filled 2013-11-22: qty 100

## 2013-11-22 MED ORDER — CALCITRIOL 0.25 MCG PO CAPS
0.2500 ug | ORAL_CAPSULE | ORAL | Status: DC
  Administered 2013-11-24: 0.25 ug via ORAL
  Filled 2013-11-22: qty 1

## 2013-11-22 MED ORDER — FUROSEMIDE 10 MG/ML IJ SOLN
20.0000 mg | Freq: Once | INTRAMUSCULAR | Status: AC
  Administered 2013-11-22: 20 mg via INTRAVENOUS
  Filled 2013-11-22: qty 4

## 2013-11-22 MED ORDER — ASPIRIN 81 MG PO CHEW
81.0000 mg | CHEWABLE_TABLET | Freq: Every day | ORAL | Status: DC
  Administered 2013-11-22 – 2013-11-26 (×5): 81 mg via ORAL
  Filled 2013-11-22 (×5): qty 1

## 2013-11-22 MED ORDER — NITROGLYCERIN 0.4 MG SL SUBL: 0.4000 mg | SUBLINGUAL_TABLET | SUBLINGUAL | Status: DC | PRN

## 2013-11-22 MED ORDER — METOPROLOL SUCCINATE ER 100 MG PO TB24
100.0000 mg | ORAL_TABLET | Freq: Two times a day (BID) | ORAL | Status: DC
  Administered 2013-11-22 – 2013-11-26 (×8): 100 mg via ORAL
  Filled 2013-11-22 (×9): qty 1

## 2013-11-22 MED ORDER — ACETAMINOPHEN 325 MG PO TABS
650.0000 mg | ORAL_TABLET | Freq: Four times a day (QID) | ORAL | Status: DC | PRN
  Administered 2013-11-22: 650 mg via ORAL
  Filled 2013-11-22: qty 2

## 2013-11-22 MED ORDER — LEVOFLOXACIN 250 MG PO TABS
250.0000 mg | ORAL_TABLET | ORAL | Status: DC
  Administered 2013-11-24 – 2013-11-26 (×2): 250 mg via ORAL
  Filled 2013-11-22 (×2): qty 1

## 2013-11-22 NOTE — ED Notes (Addendum)
Currently no IV pumps or poles. Secretary called and order additional pumps. Will start Levaquin as soon as pumps are present.

## 2013-11-22 NOTE — ED Notes (Signed)
Patient presents from home via EMS with a chief complaint of cough x 4 days with shortness of breath. Patient was seen by his PCP yesterday who "ruled him out for pneumonia". EMS reports lungs clear. Patient has room air saturation of 88%, then 96% on 2 liters per nasal cannula.  Patient reports chest wall pain when coughing.

## 2013-11-22 NOTE — Telephone Encounter (Signed)
Patient Information:  Caller Name: Sonia Side  Phone: [phone]  Patient: [name] [name], [name] [name]  Gender: Male  DOB: [birth year]  Age: 80 Years  PCP: Phoebe Sharps (Adults only)  Office Follow Up:  Does the office need to follow up with this patient?: Yes  Instructions For The Office: Plan of care for cough  RN Note:  Patient calling with c/o cough NO improvement since visit on 11/21/13.  States "My chest hurts, and I'm getting weaker. Last night I had to crawl to the bed."  Symptoms  Reason For Call & Symptoms: Cough  Reviewed Health History In EMR: Yes  Reviewed Medications In EMR: Yes  Reviewed Allergies In EMR: Yes  Reviewed Surgeries / Procedures: Yes  Date of Onset of Symptoms: 11/21/2013  Guideline(s) Used:  Cough  Disposition Per Guideline:   Go to ED Now (or to Office with PCP Approval)  Reason For Disposition Reached:   Patient sounds very sick or weak to the triager  Advice Given:  N/A  RN Overrode Recommendation:  Follow Up With Office Later  Office to follow up with patient

## 2013-11-22 NOTE — Progress Notes (Signed)
ANTICOAGULATION CONSULT NOTE - Initial Consult  Pharmacy Consult for Warfarin Indication: atrial fibrillation  Allergies  Allergen Reactions  . Ace Inhibitors     Stopped by Dr. Burt Knack due to progressive renal failure  . Prednisone Other (See Comments)    Raised blood sugar    Patient Measurements: Height: 6' (182.9 cm) Weight: 188 lb 0.8 oz (85.3 kg) IBW/kg (Calculated) : 77.6  Vital Signs: Temp: 99.9 F (37.7 C) (03/03 1552) Temp src: Oral (03/03 1552) BP: 155/55 mmHg (03/03 1552) Pulse Rate: 70 (03/03 1552)  Labs:  Recent Labs  11/22/13 1300  HGB 13.8  HCT 42.1  PLT 108*  LABPROT 31.3*  INR 3.16*  CREATININE 3.68*  TROPONINI <0.30    Estimated Creatinine Clearance: 17.6 ml/min (by C-G formula based on Cr of 3.68).   Medical History: Past Medical History  Diagnosis Date  . Chronic atrial fibrillation     a. Historically difficult rate control. b. s/p CRT-D implantation with anticipation of AV junction ablation but patient then was able to accomplish rate control and ablation not undertaken.; AVN ablation 08/2013 by Dr Lovena Le  . CARDIOMYOPATHY, PRIMARY, DILATED     a. Mixed ICM/NICM - EF 15% by echo 05/2009; St. Jude CRT-D implanted 06/2009. b. Echo 04/2013 - EF 20%, mild LVH.  [name] COLON CANCER, HX OF     a. s/p partial colectomy.  . Chronic systolic CHF (congestive heart failure)   . CORONARY ARTERY DISEASE     a. BMS to LAD 04/2008. b. Cath 06/2009: nonobstructive disease.  [name] PROSTATE CANCER, HX OF     a. s/p radical prostatectomy.  [name] SKIN CANCER, HX OF   . SLEEP APNEA   . Hypertension   . Hyperlipidemia   . Diabetes mellitus   . Osteoarthritis   . Tubular adenoma of colon   . Secondary hyperparathyroidism   . CKD (chronic kidney disease), stage IV     a. Right brachiocephalic AV fistula 03/9023. Neph = Dunham.  . Vertigo   . Valvular heart disease     a. Echo 04/2013: mild AI, mild MR.  . Pulmonary HTN     a. PA pressure 33mmHg by echo 05/01/13.     Medications:  Scheduled:  . aspirin  81 mg Oral Daily  . [START ON 11/24/2013] calcitRIOL  0.25 mcg Oral Once per day on Mon Thu  . dextromethorphan-guaiFENesin  1 tablet Oral BID  . digoxin  0.125 mg Oral QODAY  . hydrALAZINE  37.5 mg Oral TID  . insulin aspart  0-9 Units Subcutaneous TID WC  . insulin aspart protamine- aspart  45 Units Subcutaneous BID WC  . ipratropium-albuterol  3 mL Nebulization Q6H  . isosorbide mononitrate  15 mg Oral Daily  . metoprolol succinate  100 mg Oral BID  . potassium chloride SA  20 mEq Oral BID  . sodium chloride  3 mL Intravenous Q12H   Infusions:  . sodium chloride      Assessment: 80 yo male on chronic warfarin for Afib: dose per clinic notes 5mg  daily except 2.5mg  M/F, last taken 3.2. INR on admit slightly supratx 3.16 - hold dose today (3/3) given likely drug interaction with levaquin.  Goal of Therapy:  INR 2-3   Plan:   Hold warfarin tonight  Daily PT/INR  Peggyann Juba, PharmD, BCPS Pager: [phone] 11/22/2013,4:15 PM

## 2013-11-22 NOTE — H&P (Signed)
PCP:   Chancy Hurter, MD   Chief Complaint:  Cough  HPI:  80 year old male who  has a past medical history of Chronic atrial fibrillation; CARDIOMYOPATHY, PRIMARY, DILATED; COLON CANCER, HX OF; Chronic systolic CHF (congestive heart failure); CORONARY ARTERY DISEASE; PROSTATE CANCER, HX OF; SKIN CANCER, HX OF; SLEEP APNEA; Hypertension; Hyperlipidemia; Diabetes mellitus; Osteoarthritis; Tubular adenoma of colon; Secondary hyperparathyroidism; CKD (chronic kidney disease), stage IV; Vertigo; Valvular heart disease; and Pulmonary HTN. Today presented to the ED with chief complaint of cough going on for past 3 days, he was seen by his primary care physician who gave the cough medicine to the patient and was sent home. Patient continues to have cough along with associated chest  and abdominal soreness from coughing. He denies nausea vomiting, he has not been coughing up any phlegm. Patient has a history of CAD, CHF and is currently on Lasix at home. He also has a history of CAD. Patient excluded for atrial fibrillation. He denies any ongoing chest pain he only complains of chest soreness on coughing.  Allergies:   Allergies  Allergen Reactions  . Ace Inhibitors     Stopped by Dr. Burt Knack due to progressive renal failure  . Prednisone Other (See Comments)    Raised blood sugar      Past Medical History  Diagnosis Date  . Chronic atrial fibrillation     a. Historically difficult rate control. b. s/p CRT-D implantation with anticipation of AV junction ablation but patient then was able to accomplish rate control and ablation not undertaken.; AVN ablation 08/2013 by Dr Lovena Le  . CARDIOMYOPATHY, PRIMARY, DILATED     a. Mixed ICM/NICM - EF 15% by echo 05/2009; St. Jude CRT-D implanted 06/2009. b. Echo 04/2013 - EF 20%, mild LVH.  [name] COLON CANCER, HX OF     a. s/p partial colectomy.  . Chronic systolic CHF (congestive heart failure)   . CORONARY ARTERY DISEASE     a. BMS to LAD 04/2008. b. Cath  06/2009: nonobstructive disease.  [name] PROSTATE CANCER, HX OF     a. s/p radical prostatectomy.  [name] SKIN CANCER, HX OF   . SLEEP APNEA   . Hypertension   . Hyperlipidemia   . Diabetes mellitus   . Osteoarthritis   . Tubular adenoma of colon   . Secondary hyperparathyroidism   . CKD (chronic kidney disease), stage IV     a. Right brachiocephalic AV fistula 10/6201. Neph = Dunham.  . Vertigo   . Valvular heart disease     a. Echo 04/2013: mild AI, mild MR.  . Pulmonary HTN     a. PA pressure 41mmHg by echo 05/01/13.    Past Surgical History  Procedure Laterality Date  . Colectomy    . Prostatectomy    . Penile prosthesis placement    . Removed prosthetic eye    . Ptca      stent placed  . Total knee arthroplasty  2008    right  . Pacemaker insertion  2010  . Av fistula placement  03/30/2012    Procedure: ARTERIOVENOUS (AV) FISTULA CREATION;  Surgeon: Angelia Mould, MD;  Location: [place];  Service: Vascular;  Laterality: Right;  Creation of BrachioCephalic Fistula Right arm  . Eye surgery      left eye prothesis  . Fracture surgery      collar bone, right knee replacement  . Bi-ventricular implantable cardioverter defibrillator  (crt-d)  2010    STJ CRTD implanted by Dr  [name]  . Ablation  08/2013    AVN ablation by Dr Lovena Le    Prior to Admission medications   Medication Sig Start Date End Date Taking? Authorizing Provider  aspirin 81 MG chewable tablet Chew 81 mg by mouth daily.   Yes Historical Provider, MD  calcitRIOL (ROCALTROL) 0.25 MCG capsule Take 1 capsule (0.25 mcg total) by mouth as directed. Taking only on Monday and Thursday 10/10/13  Yes Burtis Junes, NP  digoxin (LANOXIN) 0.125 MG tablet Take 1 tablet (0.125 mg total) by mouth every other day. 09/13/13  Yes Barton Dubois, MD  furosemide (LASIX) 80 MG tablet Take 1 tablet (80 mg total) by mouth 2 (two) times daily. 10/25/13  Yes Sherren Mocha, MD  GuaiFENesin (MUCINEX PO) Take by mouth daily as needed  (ocngestion).   Yes Historical Provider, MD  hydrALAZINE (APRESOLINE) 25 MG tablet Take 1.5 tablets (37.5 mg total) by mouth 3 (three) times daily. 10/10/13  Yes Burtis Junes, NP  insulin lispro protamine-lispro (HUMALOG 75/25) (75-25) 100 UNIT/ML SUSP injection Inject 45 Units into the skin 2 (two) times daily with a meal.   Yes Historical Provider, MD  isosorbide mononitrate (IMDUR) 30 MG 24 hr tablet Take 0.5 tablets (15 mg total) by mouth daily. 01/31/13  Yes Lisabeth Pick, MD  meclizine (ANTIVERT) 25 MG tablet Take 1 tablet (25 mg total) by mouth 3 (three) times daily as needed for dizziness or nausea. 06/03/13  Yes Doe-Hyun R Shawna Orleans, DO  metoprolol succinate (TOPROL-XL) 100 MG 24 hr tablet Take 100 mg by mouth 2 (two) times daily. Take with or immediately following a meal.   Yes Historical Provider, MD  nitroGLYCERIN (NITROSTAT) 0.4 MG SL tablet Place 0.4 mg under the tongue every 5 (five) minutes x 3 doses as needed for chest pain.   Yes Historical Provider, MD  potassium chloride SA (K-DUR,KLOR-CON) 20 MEQ tablet Take 20 mEq by mouth 2 (two) times daily.    Yes Historical Provider, MD  warfarin (COUMADIN) 5 MG tablet Take 2.5-5 mg by mouth daily. Monday and Friday take 2.5 mg; Tuesday, Wednesday, Thursday, Saturday, and Sunday takes 5 mg.   Yes Historical Provider, MD  amoxicillin (AMOXIL) 500 MG capsule Take 1 capsule (500 mg total) by mouth 3 (three) times daily. 11/22/13   Ricard Dillon, MD  glucose blood (FREESTYLE TEST STRIPS) test strip 1 each by Other route as needed. Use as instructed     Historical Provider, MD    Social History:  reports that he quit smoking about 47 years ago. His smoking use included Cigarettes. He smoked 0.00 packs per day. He has never used smokeless tobacco. He reports that he drinks alcohol. He reports that he does not use illicit drugs.  Family History  Problem Relation Age of Onset  . Heart disease Father   . Throat cancer Father   . Throat cancer Mother    . Heart disease Mother   . Hypertension Mother      All the positives are listed in BOLD  Review of Systems:  HEENT: Headache, blurred vision, runny nose, sore throat Neck: Hypothyroidism, hyperthyroidism,,lymphadenopathy Chest : Shortness of breath, history of COPD, Asthma Heart : Chest pain, history of coronary arterey disease GI:  Nausea, vomiting, diarrhea, constipation, GERD GU: Dysuria, urgency, frequency of urination, hematuria Neuro: Stroke, seizures, syncope Psych: Depression, anxiety, hallucinations   Physical Exam: Blood pressure 163/59, temperature 100.8 F (38.2 C), temperature source Oral, resp. rate 18, SpO2 97.00%. Constitutional:  Patient is a well-developed and well-nourished male in no acute distress and cooperative with exam. Head: Normocephalic and atraumatic Mouth: Mucus membranes moist Eyes: PERRL, EOMI, conjunctivae normal Neck: Supple, No Thyromegaly Cardiovascular: RRR, S1 normal, S2 normal Pulmonary/Chest: CTAB, no wheezes, rales, or rhonchi Abdominal: Soft. Non-tender, non-distended, bowel sounds are normal, no masses, organomegaly, or guarding present.  Neurological: A&O x3, Strenght is normal and symmetric bilaterally, cranial nerve II-XII are grossly intact, no focal motor deficit, sensory intact to light touch bilaterally.  Extremities : No Cyanosis, Clubbing or Edema   Labs on Admission:  Results for orders placed during the hospital encounter of 11/22/13 (from the past 48 hour(s))  PRO B NATRIURETIC PEPTIDE     Status: Abnormal   Collection Time    11/22/13  1:00 PM      Result Value Ref Range   Pro B Natriuretic peptide (BNP) 17489.0 (*) 0 - 450 pg/mL  CBC WITH DIFFERENTIAL     Status: Abnormal   Collection Time    11/22/13  1:00 PM      Result Value Ref Range   WBC 5.7  4.0 - 10.5 K/uL   RBC 4.44  4.22 - 5.81 MIL/uL   Hemoglobin 13.8  13.0 - 17.0 g/dL   HCT 42.1  39.0 - 52.0 %   MCV 94.8  78.0 - 100.0 fL   MCH 31.1  26.0 - 34.0  pg   MCHC 32.8  30.0 - 36.0 g/dL   RDW 15.0  11.5 - 15.5 %   Platelets 108 (*) 150 - 400 K/uL   Comment: REPEATED TO VERIFY     SPECIMEN CHECKED FOR CLOTS     PLATELET COUNT CONFIRMED BY SMEAR   Neutrophils Relative % 87 (*) 43 - 77 %   Lymphocytes Relative 8 (*) 12 - 46 %   Monocytes Relative 5  3 - 12 %   Eosinophils Relative 0  0 - 5 %   Basophils Relative 0  0 - 1 %   Neutro Abs 4.9  1.7 - 7.7 K/uL   Lymphs Abs 0.5 (*) 0.7 - 4.0 K/uL   Monocytes Absolute 0.3  0.1 - 1.0 K/uL   Eosinophils Absolute 0.0  0.0 - 0.7 K/uL   Basophils Absolute 0.0  0.0 - 0.1 K/uL   Smear Review PLATELET COUNT CONFIRMED BY SMEAR    COMPREHENSIVE METABOLIC PANEL     Status: Abnormal   Collection Time    11/22/13  1:00 PM      Result Value Ref Range   Sodium 143  137 - 147 mEq/L   Potassium 5.0  3.7 - 5.3 mEq/L   Chloride 102  96 - 112 mEq/L   CO2 21  19 - 32 mEq/L   Glucose, Bld 262 (*) 70 - 99 mg/dL   BUN 61 (*) 6 - 23 mg/dL   Creatinine, Ser 3.68 (*) 0.50 - 1.35 mg/dL   Calcium 9.2  8.4 - 10.5 mg/dL   Total Protein 6.1  6.0 - 8.3 g/dL   Albumin 3.4 (*) 3.5 - 5.2 g/dL   AST 40 (*) 0 - 37 U/L   ALT 28  0 - 53 U/L   Alkaline Phosphatase 36 (*) 39 - 117 U/L   Total Bilirubin 1.0  0.3 - 1.2 mg/dL   GFR calc non Af Amer 14 (*) >90 mL/min   GFR calc Af Amer 17 (*) >90 mL/min   Comment: (NOTE)     The eGFR has been calculated  using the CKD EPI equation.     This calculation has not been validated in all clinical situations.     eGFR's persistently <90 mL/min signify possible Chronic Kidney     Disease.  TROPONIN I     Status: None   Collection Time    11/22/13  1:00 PM      Result Value Ref Range   Troponin I <0.30  <0.30 ng/mL   Comment:            Due to the release kinetics of cTnI,     a negative result within the first hours     of the onset of symptoms does not rule out     myocardial infarction with certainty.     If myocardial infarction is still suspected,     repeat the test at  appropriate intervals.  PROTIME-INR     Status: Abnormal   Collection Time    11/22/13  1:00 PM      Result Value Ref Range   Prothrombin Time 31.3 (*) 11.6 - 15.2 seconds   INR 3.16 (*) 0.00 - 1.49  DIGOXIN LEVEL     Status: Abnormal   Collection Time    11/22/13  1:00 PM      Result Value Ref Range   Digoxin Level 0.4 (*) 0.8 - 2.0 ng/mL    Radiological Exams on Admission: Dg Chest 2 View  11/22/2013   CLINICAL DATA:  Cough  EXAM: CHEST  2 VIEW  COMPARISON:  09/10/2013  FINDINGS: Cardiac enlargement with mild vascular congestion. No edema or effusion. AICD in good position. Negative for pneumonia.  IMPRESSION: Cardiac enlargement with mild vascular congestion. Negative for edema.   Electronically Signed   By: Franchot Gallo M.D.   On: 11/22/2013 13:33    Assessment/Plan Principal Problem:   Acute bronchitis Active Problems:   DIABETES MELLITUS, TYPE II   HYPERTENSION   CORONARY ARTERY DISEASE   Chronic atrial fibrillation   CONGESTIVE HEART FAILURE   BIVentricular Defibrillator--St Jude   Chronic renal insufficiency, stage IV (severe)   Bronchitis  Acute bronchitis Patient  will be admitted under observation, will start the patient on Mucinex DM twice a day, Levaquin, though that's every 6 hours. Will also start empirically on Tamiflu and check influenza PCR.  Diabetes mellitus Start sliding scale insulin, continue with insulin 7030 45 units twice a day.  Chronic atrial fibrillation Continue digoxin, metoprolol, Coumadin per pharmacy consultation.  C KD stage IV  patient's creatinine today is 3.68, which is little bit elevated from his baseline 3.4. Will hold the Lasix at this time Can restart Lasix in a.m. after checking BMP in morning  Congestive heart failure He's not in congestive heart failure at this time I will hold the Lasix due to worsening of renal function Can restart Lasix in a.m.  Coronary artery disease Continue metoprolol, aspirin, Imdur 30 mg half  tablet daily  Hypertension Continue hydralazine, metoprolol     Code status:DNR  Family discussion: No family at bedside   Time Spent on Admission: 47 min  [place] [facility] Pager: [phone] 11/22/2013, 3:24 PM  If 7PM-7AM, please contact night-coverage  [email or web address]  Password [email or web address]

## 2013-11-22 NOTE — ED Notes (Signed)
Patient unable to stand or ambulate with pulse oximetry. Patient also febrile. Dr. Wyvonnia Dusky notified and patient provided with water.

## 2013-11-22 NOTE — ED Notes (Signed)
Bed: WA21 Expected date:  Expected time:  Means of arrival:  Comments: EMS-cough/PNA

## 2013-11-22 NOTE — ED Provider Notes (Signed)
CSN: [id number]     Arrival date & time 11/22/13  1201 History   First MD Initiated Contact with Patient 11/22/13 1232     Chief Complaint  Patient presents with  . Cough     (Consider location/radiation/quality/duration/timing/severity/associated sxs/prior Treatment) HPI Comments: Patient presents with a four-day history of cough, shortness of breath, congestion body aches and chills. He saw his doctor yesterday who thought he had a viral URI. He comes in today with generalized weakness, increased difficulty breathing and chest pain with coughing and abdominal soreness. Denies any nausea, vomiting or fever. He isn't wearing his wife's oxygen with relief he is normally is not wear oxygen. Denies any leg pain or leg swelling. Denies any back pain or neck pain. States compliance with medications. Did receive a flu shot.  The history is provided by the patient.    Past Medical History  Diagnosis Date  . Chronic atrial fibrillation     a. Historically difficult rate control. b. s/p CRT-D implantation with anticipation of AV junction ablation but patient then was able to accomplish rate control and ablation not undertaken.; AVN ablation 08/2013 by Dr Lovena Le  . CARDIOMYOPATHY, PRIMARY, DILATED     a. Mixed ICM/NICM - EF 15% by echo 05/2009; St. Jude CRT-D implanted 06/2009. b. Echo 04/2013 - EF 20%, mild LVH.  [name] COLON CANCER, HX OF     a. s/p partial colectomy.  . Chronic systolic CHF (congestive heart failure)   . CORONARY ARTERY DISEASE     a. BMS to LAD 04/2008. b. Cath 06/2009: nonobstructive disease.  [name] PROSTATE CANCER, HX OF     a. s/p radical prostatectomy.  [name] SKIN CANCER, HX OF   . SLEEP APNEA   . Hypertension   . Hyperlipidemia   . Diabetes mellitus   . Osteoarthritis   . Tubular adenoma of colon   . Secondary hyperparathyroidism   . CKD (chronic kidney disease), stage IV     a. Right brachiocephalic AV fistula [id number]. Neph = Dunham.  . Vertigo   . Valvular heart disease     a.  Echo 04/2013: mild AI, mild MR.  . Pulmonary HTN     a. PA pressure 22mmHg by echo 05/01/13.   Past Surgical History  Procedure Laterality Date  . Colectomy    . Prostatectomy    . Penile prosthesis placement    . Removed prosthetic eye    . Ptca      stent placed  . Total knee arthroplasty  2008    right  . Pacemaker insertion  2010  . Av fistula placement  03/30/2012    Procedure: ARTERIOVENOUS (AV) FISTULA CREATION;  Surgeon: Angelia Mould, MD;  Location: [place];  Service: Vascular;  Laterality: Right;  Creation of BrachioCephalic Fistula Right arm  . Eye surgery      left eye prothesis  . Fracture surgery      collar bone, right knee replacement  . Bi-ventricular implantable cardioverter defibrillator  (crt-d)  2010    STJ CRTD implanted by Dr [name]  . Ablation  08/2013    AVN ablation by Dr Lovena Le   Family History  Problem Relation Age of Onset  . Heart disease Father   . Throat cancer Father   . Throat cancer Mother   . Heart disease Mother   . Hypertension Mother    History  Substance Use Topics  . Smoking status: Former Smoker    Types: Cigarettes    Quit date:  09/22/1966  . Smokeless tobacco: Never Used  . Alcohol Use: 0 - .5 oz/week    0-1 drink(s) per week     Comment: occasional    Review of Systems  Constitutional: Positive for activity change and appetite change. Negative for fever and fatigue.  HENT: Positive for congestion and rhinorrhea.   Respiratory: Positive for cough and shortness of breath. Negative for chest tightness.   Cardiovascular: Positive for chest pain.  Gastrointestinal: Negative for nausea, vomiting and abdominal pain.  Genitourinary: Negative for dysuria and hematuria.  Musculoskeletal: Positive for arthralgias and myalgias.  Skin: Negative for rash.  Neurological: Positive for weakness. Negative for dizziness and headaches.  A complete 10 system review of systems was obtained and all systems are negative except as noted in  the HPI and PMH.      Allergies  Ace inhibitors and Prednisone  Home Medications   No current outpatient prescriptions on file. BP 155/55  Pulse 70  Temp(Src) 99.9 F (37.7 C) (Oral)  Resp 24  Ht 6" (0.152 m)  Wt 188 lb 0.8 oz (85.3 kg)  BMI 3692.00 kg/m2  SpO2 98% Physical Exam  Constitutional: He is oriented to person, place, and time. He appears well-developed and well-nourished. No distress.  HENT:  Head: Normocephalic and atraumatic.  Mouth/Throat: Oropharynx is clear and moist. No oropharyngeal exudate.  Dry mucus membranes  Eyes: Conjunctivae and EOM are normal. Pupils are equal, round, and reactive to light.  Neck: Normal range of motion. Neck supple.  Cardiovascular: Normal rate, regular rhythm and normal heart sounds.   No murmur heard. Pulmonary/Chest: Effort normal and breath sounds normal. No respiratory distress.  Scattered rhonchi  Abdominal: Soft. There is no tenderness. There is no rebound and no guarding.  Musculoskeletal: Normal range of motion. He exhibits no edema and no tenderness.  Neurological: He is alert and oriented to person, place, and time. No cranial nerve deficit. He exhibits normal muscle tone. Coordination normal.  Skin: Skin is warm.    ED Course  Procedures (including critical care time) Labs Review Labs Reviewed  PRO B NATRIURETIC PEPTIDE - Abnormal; Notable for the following:    Pro B Natriuretic peptide (BNP) 17489.0 (*)    All other components within normal limits  CBC WITH DIFFERENTIAL - Abnormal; Notable for the following:    Platelets 108 (*)    Neutrophils Relative % 87 (*)    Lymphocytes Relative 8 (*)    Lymphs Abs 0.5 (*)    All other components within normal limits  COMPREHENSIVE METABOLIC PANEL - Abnormal; Notable for the following:    Glucose, Bld 262 (*)    BUN 61 (*)    Creatinine, Ser 3.68 (*)    Albumin 3.4 (*)    AST 40 (*)    Alkaline Phosphatase 36 (*)    GFR calc non Af Amer 14 (*)    GFR calc Af Amer  17 (*)    All other components within normal limits  PROTIME-INR - Abnormal; Notable for the following:    Prothrombin Time 31.3 (*)    INR 3.16 (*)    All other components within normal limits  DIGOXIN LEVEL - Abnormal; Notable for the following:    Digoxin Level 0.4 (*)    All other components within normal limits  TROPONIN I  INFLUENZA PANEL BY PCR (TYPE A & B, H1N1)   Imaging Review Dg Chest 2 View  11/22/2013   CLINICAL DATA:  Cough  EXAM: CHEST  2  VIEW  COMPARISON:  09/10/2013  FINDINGS: Cardiac enlargement with mild vascular congestion. No edema or effusion. AICD in good position. Negative for pneumonia.  IMPRESSION: Cardiac enlargement with mild vascular congestion. Negative for edema.   Electronically Signed   By: Franchot Gallo M.D.   On: 11/22/2013 13:33     EKG Interpretation   Date/Time:  Tuesday November 22 2013 13:54:57 EST Ventricular Rate:  70 PR Interval:  189 QRS Duration: 142 QT Interval:  438 QTC Calculation: 473 R Axis:   164 Text Interpretation:  Ventricular-paced rhythm No further analysis  attempted due to paced rhythm No significant change was found Confirmed by  Wyvonnia Dusky  MD, [place] [phone]) on 11/22/2013 2:03:57 PM      MDM   Final diagnoses:  Bronchitis  CHF exacerbation   4 day history of cough, congestion, dyspnea on exertion or shortness of breath. Some chest pain from coughing. Hypoxic on room air to 88%.  Chest x-ray shows no infiltrate or pulmonary edema. Mild basilar congestion. Patient given nebulizer. Started on Levaquin for probable bronchitis.  EKG is paced. INR therapeutic, doubt PE.  Slight worsening of chronic kidney disease with creatinine of 3.6 from baseline of 3.3. Hold lasix in setting of worsening renal function.  On Attempted ambulation, patient desaturates to the 80s and is very weak and can only take a couple steps. He will require admission for supplemental oxygen.   Ezequiel Essex, MD 11/22/13 [phone]

## 2013-11-22 NOTE — Progress Notes (Signed)
ANTIBIOTIC CONSULT NOTE - INITIAL  Pharmacy Consult for Levaquin Indication: Acute bronchitis  Allergies  Allergen Reactions  . Ace Inhibitors     Stopped by Dr. Burt Knack due to progressive renal failure  . Prednisone Other (See Comments)    Raised blood sugar    Patient Measurements: Height: 6' (182.9 cm) Weight: 188 lb 0.8 oz (85.3 kg) IBW/kg (Calculated) : 77.6  Vital Signs: Temp: 99.9 F (37.7 C) (03/03 1552) Temp src: Oral (03/03 1552) BP: 155/55 mmHg (03/03 1552) Pulse Rate: 70 (03/03 1552) Intake/Output from previous day:   Intake/Output from this shift: Total I/O In: -  Out: 250 [Urine:250]  Labs:  Recent Labs  11/22/13 1300  WBC 5.7  HGB 13.8  PLT 108*  CREATININE 3.68*   Estimated Creatinine Clearance: 17.6 ml/min (by C-G formula based on Cr of 3.68). No results found for this basename: VANCOTROUGH, VANCOPEAK, VANCORANDOM, GENTTROUGH, GENTPEAK, GENTRANDOM, TOBRATROUGH, TOBRAPEAK, TOBRARND, AMIKACINPEAK, AMIKACINTROU, AMIKACIN,  in the last 72 hours   Microbiology: No results found for this or any previous visit (from the past 720 hour(s)).  Medical History: Past Medical History  Diagnosis Date  . Chronic atrial fibrillation     a. Historically difficult rate control. b. s/p CRT-D implantation with anticipation of AV junction ablation but patient then was able to accomplish rate control and ablation not undertaken.; AVN ablation 08/2013 by Dr Lovena Le  . CARDIOMYOPATHY, PRIMARY, DILATED     a. Mixed ICM/NICM - EF 15% by echo 05/2009; St. Jude CRT-D implanted 06/2009. b. Echo 04/2013 - EF 20%, mild LVH.  [name] COLON CANCER, HX OF     a. s/p partial colectomy.  . Chronic systolic CHF (congestive heart failure)   . CORONARY ARTERY DISEASE     a. BMS to LAD 04/2008. b. Cath 06/2009: nonobstructive disease.  [name] PROSTATE CANCER, HX OF     a. s/p radical prostatectomy.  [name] SKIN CANCER, HX OF   . SLEEP APNEA   . Hypertension   . Hyperlipidemia   . Diabetes  mellitus   . Osteoarthritis   . Tubular adenoma of colon   . Secondary hyperparathyroidism   . CKD (chronic kidney disease), stage IV     a. Right brachiocephalic AV fistula 10/2977. Neph = Dunham.  . Vertigo   . Valvular heart disease     a. Echo 04/2013: mild AI, mild MR.  . Pulmonary HTN     a. PA pressure 43mmHg by echo 05/01/13.    Medications:  Scheduled:  . aspirin  81 mg Oral Daily  . [START ON 11/24/2013] calcitRIOL  0.25 mcg Oral Once per day on Mon Thu  . dextromethorphan-guaiFENesin  1 tablet Oral BID  . digoxin  0.125 mg Oral QODAY  . hydrALAZINE  37.5 mg Oral TID  . insulin aspart  0-9 Units Subcutaneous TID WC  . insulin aspart protamine- aspart  45 Units Subcutaneous BID WC  . ipratropium-albuterol  3 mL Nebulization Q6H  . isosorbide mononitrate  15 mg Oral Daily  . metoprolol succinate  100 mg Oral BID  . potassium chloride SA  20 mEq Oral BID  . sodium chloride  3 mL Intravenous Q12H   Infusions:  . sodium chloride     Assessment: 79 yo male presents 3/3 with cough x 3 days. Starting levaquin for acute bronchitis. Dose will require adjustment for renal function.  Goal of Therapy:  Eradication of infection, dose per renal function  Plan:   Levaquin 500mg  IV x1, then 250mg   PO q48h - next dose 3/5 Follow up renal function & cultures  Peggyann Juba, PharmD, BCPS Pager: [phone] 11/22/2013,4:26 PM

## 2013-11-22 NOTE — Telephone Encounter (Signed)
Per dr Arnoldo Morale may have amoxicillin 500 tid for 10 days/pt informed and med sent in

## 2013-11-23 LAB — CBC
HCT: 38.6 % — ABNORMAL LOW (ref 39.0–52.0)
HEMOGLOBIN: 12.3 g/dL — AB (ref 13.0–17.0)
MCH: 30.2 pg (ref 26.0–34.0)
MCHC: 31.9 g/dL (ref 30.0–36.0)
MCV: 94.8 fL (ref 78.0–100.0)
Platelets: 95 10*3/uL — ABNORMAL LOW (ref 150–400)
RBC: 4.07 MIL/uL — ABNORMAL LOW (ref 4.22–5.81)
RDW: 14.8 % (ref 11.5–15.5)
WBC: 5.6 10*3/uL (ref 4.0–10.5)

## 2013-11-23 LAB — COMPREHENSIVE METABOLIC PANEL
ALBUMIN: 2.9 g/dL — AB (ref 3.5–5.2)
ALK PHOS: 29 U/L — AB (ref 39–117)
ALT: 26 U/L (ref 0–53)
AST: 40 U/L — AB (ref 0–37)
BUN: 64 mg/dL — ABNORMAL HIGH (ref 6–23)
CALCIUM: 8.7 mg/dL (ref 8.4–10.5)
CO2: 25 mEq/L (ref 19–32)
Chloride: 104 mEq/L (ref 96–112)
Creatinine, Ser: 3.65 mg/dL — ABNORMAL HIGH (ref 0.50–1.35)
GFR calc non Af Amer: 14 mL/min — ABNORMAL LOW (ref >90)
GFR, EST AFRICAN AMERICAN: 17 mL/min — AB (ref >90)
GLUCOSE: 127 mg/dL — AB (ref 70–99)
POTASSIUM: 3.6 meq/L — AB (ref 3.7–5.3)
SODIUM: 144 meq/L (ref 137–147)
Total Bilirubin: 0.8 mg/dL (ref 0.3–1.2)
Total Protein: 5.4 g/dL — ABNORMAL LOW (ref 6.0–8.3)

## 2013-11-23 LAB — GLUCOSE, CAPILLARY
Glucose-Capillary: 132 mg/dL — ABNORMAL HIGH (ref 70–99)
Glucose-Capillary: 129 mg/dL — ABNORMAL HIGH (ref 70–99)
Glucose-Capillary: 200 mg/dL — ABNORMAL HIGH (ref 70–99)
Glucose-Capillary: 62 mg/dL — ABNORMAL LOW (ref 70–99)

## 2013-11-23 LAB — PROTIME-INR
INR: 3.24 — ABNORMAL HIGH (ref 0.00–1.49)
Prothrombin Time: 31.9 seconds — ABNORMAL HIGH (ref 11.6–15.2)

## 2013-11-23 LAB — MAGNESIUM: Magnesium: 2.2 mg/dL (ref 1.5–2.5)

## 2013-11-23 LAB — PHOSPHORUS: PHOSPHORUS: 3.1 mg/dL (ref 2.3–4.6)

## 2013-11-23 MED ORDER — FUROSEMIDE 80 MG PO TABS
80.0000 mg | ORAL_TABLET | Freq: Two times a day (BID) | ORAL | Status: DC
  Administered 2013-11-23 – 2013-11-26 (×6): 80 mg via ORAL
  Filled 2013-11-23 (×8): qty 1

## 2013-11-23 MED ORDER — ENSURE COMPLETE PO LIQD
237.0000 mL | Freq: Two times a day (BID) | ORAL | Status: DC
  Administered 2013-11-23 – 2013-11-26 (×4): 237 mL via ORAL

## 2013-11-23 MED ORDER — POTASSIUM CHLORIDE CRYS ER 20 MEQ PO TBCR
40.0000 meq | EXTENDED_RELEASE_TABLET | Freq: Once | ORAL | Status: AC
  Administered 2013-11-23: 40 meq via ORAL
  Filled 2013-11-23: qty 2

## 2013-11-23 MED ORDER — GLUCOSE 40 % PO GEL: 1.0000 | ORAL | Status: DC | PRN

## 2013-11-23 MED ORDER — IPRATROPIUM-ALBUTEROL 0.5-2.5 (3) MG/3ML IN SOLN
3.0000 mL | Freq: Three times a day (TID) | RESPIRATORY_TRACT | Status: DC
  Administered 2013-11-24 – 2013-11-25 (×6): 3 mL via RESPIRATORY_TRACT
  Filled 2013-11-23 (×6): qty 3

## 2013-11-23 MED ORDER — POTASSIUM CHLORIDE 10 MEQ/100ML IV SOLN
10.0000 meq | INTRAVENOUS | Status: AC
  Administered 2013-11-23: 10 meq via INTRAVENOUS
  Filled 2013-11-23 (×2): qty 100

## 2013-11-23 MED ORDER — MENTHOL 3 MG MT LOZG
1.0000 | LOZENGE | OROMUCOSAL | Status: DC | PRN
  Administered 2013-11-23: 3 mg via ORAL
  Filled 2013-11-23: qty 9

## 2013-11-23 MED ORDER — ALBUTEROL SULFATE (2.5 MG/3ML) 0.083% IN NEBU
2.5000 mg | INHALATION_SOLUTION | Freq: Four times a day (QID) | RESPIRATORY_TRACT | Status: DC | PRN
  Administered 2013-11-24: 2.5 mg via RESPIRATORY_TRACT
  Filled 2013-11-23: qty 3

## 2013-11-23 MED ORDER — LORAZEPAM 0.5 MG PO TABS
0.2500 mg | ORAL_TABLET | Freq: Once | ORAL | Status: AC | PRN
  Administered 2013-11-23: 0.25 mg via ORAL
  Filled 2013-11-23: qty 1

## 2013-11-23 NOTE — Progress Notes (Signed)
[facility] PROGRESS NOTE  [name] [name]. DPO:[id number] DOB: October 23, 1930 DOA: 11/22/2013 PCP: Chancy Hurter, MD  Assessment/Plan: Acute bronchitis  - Influenza PCR negative - Will discontinue droplet precaution and d/c tamiflu - Continue levaquin  Diabetes mellitus  Continue sliding scale insulin  -continue with insulin 70/30 45 units twice a day.  - continue to monitor blood sugars and adjust hypoglycemic agents pending values.  Chronic atrial fibrillation  - Continue digoxin, metoprolol, Coumadin per pharmacy consultation.   C KD stage IV  - Patient at or near baseline. Will continue home lasix regimen. - reassess next am.  Congestive heart failure  He's not in congestive heart failure at this time  - continue home regimen including lasix.  Coronary artery disease  Continue metoprolol, aspirin, Imdur 30 mg half tablet daily   Hypertension  Continue hydralazine, metoprolol   Code Status: Full Family Communication: discussed with patient at bedside Disposition Plan: Pending continued improvement in condition.   Consultants:  None  Procedures:  None  Antibiotics:  Levaquin  HPI/Subjective: Patient has no new complaints. Coughing is easing up reportedly.  Objective: Filed Vitals:   11/23/13 0450  BP: 142/73  Pulse: 71  Temp: 97.9 F (36.6 C)  Resp: 20    Intake/Output Summary (Last 24 hours) at 11/23/13 1115 Last data filed at 11/23/13 0900  Gross per 24 hour  Intake 245.33 ml  Output    825 ml  Net -579.67 ml   Filed Weights   11/22/13 1552  Weight: 85.3 kg (188 lb 0.8 oz)    Exam:   General:  Pt in NAD, Alert and awake  Cardiovascular: irregularly irregular, no rubs  Respiratory: [place] in place, no wheezes, rhales BL  Abdomen: soft, NT, ND  Musculoskeletal: no cyanosis or clubbing   Data Reviewed: Basic Metabolic Panel:  Recent Labs Lab 11/22/13 1300 11/23/13 0515  NA 143 144  K 5.0 3.6*  CL 102 104  CO2 21 25   GLUCOSE 262* 127*  BUN 61* 64*  CREATININE 3.68* 3.65*  CALCIUM 9.2 8.7   Liver Function Tests:  Recent Labs Lab 11/22/13 1300 11/23/13 0515  AST 40* 40*  ALT 28 26  ALKPHOS 36* 29*  BILITOT 1.0 0.8  PROT 6.1 5.4*  ALBUMIN 3.4* 2.9*   No results found for this basename: LIPASE, AMYLASE,  in the last 168 hours No results found for this basename: AMMONIA,  in the last 168 hours CBC:  Recent Labs Lab 11/22/13 1300 11/23/13 0515  WBC 5.7 5.6  NEUTROABS 4.9  --   HGB 13.8 12.3*  HCT 42.1 38.6*  MCV 94.8 94.8  PLT 108* 95*   Cardiac Enzymes:  Recent Labs Lab 11/22/13 1300  TROPONINI <0.30   BNP (last 3 results)  Recent Labs  09/10/13 0315 09/12/13 0450 11/22/13 1300  PROBNP 10776.0* 6255.0* 17489.0*   CBG:  Recent Labs Lab 11/22/13 1720 11/22/13 2051 11/23/13 0716  GLUCAP 256* 168* 132*    No results found for this or any previous visit (from the past 240 hour(s)).   Studies: Dg Chest 2 View  11/22/2013   CLINICAL DATA:  Cough  EXAM: CHEST  2 VIEW  COMPARISON:  09/10/2013  FINDINGS: Cardiac enlargement with mild vascular congestion. No edema or effusion. AICD in good position. Negative for pneumonia.  IMPRESSION: Cardiac enlargement with mild vascular congestion. Negative for edema.   Electronically Signed   By: Franchot Gallo M.D.   On: 11/22/2013 13:33    Scheduled Meds: .  aspirin  81 mg Oral Daily  . [START ON 11/24/2013] calcitRIOL  0.25 mcg Oral Once per day on Mon Thu  . dextromethorphan-guaiFENesin  1 tablet Oral BID  . digoxin  0.125 mg Oral QODAY  . hydrALAZINE  37.5 mg Oral TID  . insulin aspart  0-9 Units Subcutaneous TID WC  . insulin aspart protamine- aspart  45 Units Subcutaneous BID WC  . ipratropium-albuterol  3 mL Nebulization Q6H  . isosorbide mononitrate  15 mg Oral Daily  . [START ON 11/24/2013] levofloxacin  250 mg Oral Q48H  . metoprolol succinate  100 mg Oral BID  . potassium chloride SA  20 mEq Oral BID  . sodium chloride   3 mL Intravenous Q12H   Continuous Infusions: . sodium chloride 10 mL/hr at 11/22/13 1728    Principal Problem:   Acute bronchitis Active Problems:   DIABETES MELLITUS, TYPE II   HYPERTENSION   CORONARY ARTERY DISEASE   Chronic atrial fibrillation   CONGESTIVE HEART FAILURE   BIVentricular Defibrillator--St Jude   Chronic renal insufficiency, stage IV (severe)   Bronchitis   Time spent: > 35 minutes   Velvet Bathe  [facility] Pager [phone] If 7PM-7AM, please contact night-coverage at [email or web address], password [facility] 11/23/2013, 11:15 AM  LOS: 1 day

## 2013-11-23 NOTE — Progress Notes (Signed)
INITIAL NUTRITION ASSESSMENT  DOCUMENTATION CODES Per approved criteria  -Not Applicable   INTERVENTION:  Ensure Complete BID, each supplement provides 350 kcal and 13 grams protein  Continue with liberalized diet   NUTRITION DIAGNOSIS: Inadequate oral intake related to poor appetite as evidenced by patient report of PO intake.   Goal: Patient to meet >/= 90% of estimated nutrition needs.  Monitor:  PO intake and supplement acceptance, I/Os, weight trends, labs  Reason for Assessment: RN Consult-patient requested, poor PO intake  80 y.o. male  Admitting Dx: Acute bronchitis  ASSESSMENT: 80 year old male who has a past medical history of Chronic atrial fibrillation; CARDIOMYOPATHY, PRIMARY, DILATED; COLON CANCER, HX OF; Chronic systolic CHF (congestive heart failure); CORONARY ARTERY DISEASE; PROSTATE CANCER, HX OF; SKIN CANCER, HX OF; SLEEP APNEA; Hypertension; Hyperlipidemia; Diabetes mellitus; Osteoarthritis; Tubular adenoma of colon; Secondary hyperparathyroidism; CKD (chronic kidney disease), stage IV; Vertigo; Valvular heart disease; and Pulmonary HTN.   Patient presented to the ED with chief complaint of cough going on for past 3 days, he was seen by his primary care physician who gave the cough medicine to the patient and was sent home. Patient continues to have cough along with associated chest and abdominal soreness from coughing. He denies nausea vomiting, he has not been coughing up any phlegm. Patient has a history of CAD, CHF and is currently on Lasix at home. He also has a history of CAD. Patient excluded for atrial fibrillation. He denies any ongoing chest pain he only complains of chest soreness on coughing.  Patient reported eating no food since Monday. Patient stated that he tried to drink orange juice and coffee with milk this morning, but the orange juice was too acidic, the coffee turned his stomach "sour," and he didn't want to drink the milk by itself so as not  to make his mucus worse. Patient had questions about advancing his diet and ways to increase intake. Dietetic intern encouraged patient to drink Ensure Complete when PO intake is poor. Dietetic intern encouraged patient to continue to order meals and pick different foods that he thinks he might tolerate while he is unwell. Current diet order is heart healthy/carb modified. Upon dietetic intern visit, patient had consumed 1/2 bowl of broth and some juice. Pt with hx of DM2, but has had minimal intake of CHO from meal sources. Modify Ensure  to Glucerna shakes if CBG's > 150 mg/dl. Pt w/hx of renal disease IV, monitor renal profile. Continue with current liberalized diet to encourage PO intake.    Nutrition Focused Physical Exam:  Subcutaneous Fat:  Orbital Region: WNL Upper Arm Region: WNL Thoracic and Lumbar Region: N/A  Muscle:  Temple Region: WNL Clavicle Bone Region: WNL Clavicle and Acromion Bone Region: WNL Scapular Bone Region: N/A Dorsal Hand: WNL Patellar Region: N/A  Anterior Thigh Region: N/A Posterior Calf Region: N/A  Edema: none noted  Height: Ht Readings from Last 1 Encounters:  11/22/13 6' (1.829 m)    Weight: Wt Readings from Last 1 Encounters:  11/22/13 188 lb 0.8 oz (85.3 kg)    Ideal Body Weight: 178 lb (80.9 kg)  % Ideal Body Weight: 106%  Wt Readings from Last 10 Encounters:  11/22/13 188 lb 0.8 oz (85.3 kg)  11/21/13 193 lb (87.544 kg)  11/03/13 194 lb (87.998 kg)  11/02/13 190 lb (86.183 kg)  10/26/13 195 lb (88.451 kg)  10/10/13 195 lb 6.4 oz (88.633 kg)  09/20/13 193 lb 6.4 oz (87.726 kg)  09/16/13 181 lb (82.101  kg)  09/13/13 184 lb 11.9 oz (83.8 kg)  09/13/13 184 lb 11.9 oz (83.8 kg)    Usual Body Weight: 195 lb (88.5 kg)  % Usual Body Weight: 96%  BMI:  Body mass index is 25.5 kg/(m^2).  Estimated Nutritional Needs: Kcal: 2000-2200 Protein: 60-70 grams Fluid: 2.1-2.3 L  Skin: no wounds  Diet Order:  Heart Healthy/Carb  Modified  EDUCATION NEEDS: -Education needs addressed   Intake/Output Summary (Last 24 hours) at 11/23/13 1323 Last data filed at 11/23/13 0900  Gross per 24 hour  Intake 245.33 ml  Output    825 ml  Net -579.67 ml    Last BM: 3/3   Labs:   Recent Labs Lab 11/22/13 1300 11/23/13 0515  NA 143 144  K 5.0 3.6*  CL 102 104  CO2 21 25  BUN 61* 64*  CREATININE 3.68* 3.65*  CALCIUM 9.2 8.7  GLUCOSE 262* 127*    CBG (last 3)   Recent Labs  11/22/13 2051 11/23/13 0716 11/23/13 1146  GLUCAP 168* 132* 129*    Scheduled Meds: . aspirin  81 mg Oral Daily  . [START ON 11/24/2013] calcitRIOL  0.25 mcg Oral Once per day on Mon Thu  . dextromethorphan-guaiFENesin  1 tablet Oral BID  . digoxin  0.125 mg Oral QODAY  . furosemide  80 mg Oral BID  . hydrALAZINE  37.5 mg Oral TID  . insulin aspart  0-9 Units Subcutaneous TID WC  . insulin aspart protamine- aspart  45 Units Subcutaneous BID WC  . ipratropium-albuterol  3 mL Nebulization Q6H  . isosorbide mononitrate  15 mg Oral Daily  . [START ON 11/24/2013] levofloxacin  250 mg Oral Q48H  . metoprolol succinate  100 mg Oral BID  . potassium chloride SA  20 mEq Oral BID  . sodium chloride  3 mL Intravenous Q12H    Continuous Infusions: . sodium chloride 10 mL/hr at 11/22/13 1728    Past Medical History  Diagnosis Date  . Chronic atrial fibrillation     a. Historically difficult rate control. b. s/p CRT-D implantation with anticipation of AV junction ablation but patient then was able to accomplish rate control and ablation not undertaken.; AVN ablation 08/2013 by Dr Lovena Le  . CARDIOMYOPATHY, PRIMARY, DILATED     a. Mixed ICM/NICM - EF 15% by echo 05/2009; St. Jude CRT-D implanted 06/2009. b. Echo 04/2013 - EF 20%, mild LVH.  [name] COLON CANCER, HX OF     a. s/p partial colectomy.  . Chronic systolic CHF (congestive heart failure)   . CORONARY ARTERY DISEASE     a. BMS to LAD 04/2008. b. Cath 06/2009: nonobstructive disease.   [name] PROSTATE CANCER, HX OF     a. s/p radical prostatectomy.  [name] SKIN CANCER, HX OF   . SLEEP APNEA   . Hypertension   . Hyperlipidemia   . Diabetes mellitus   . Osteoarthritis   . Tubular adenoma of colon   . Secondary hyperparathyroidism   . CKD (chronic kidney disease), stage IV     a. Right brachiocephalic AV fistula [id number]. Neph = [place].  . Vertigo   . Valvular heart disease     a. Echo 04/2013: mild AI, mild MR.  . Pulmonary HTN     a. PA pressure 29mmHg by echo 05/01/13.    Past Surgical History  Procedure Laterality Date  . Colectomy    . Prostatectomy    . Penile prosthesis placement    . Removed prosthetic eye    .  Ptca      stent placed  . Total knee arthroplasty  2008    right  . Pacemaker insertion  2010  . Av fistula placement  03/30/2012    Procedure: ARTERIOVENOUS (AV) FISTULA CREATION;  Surgeon: Angelia Mould, MD;  Location: [facility];  Service: Vascular;  Laterality: Right;  Creation of BrachioCephalic Fistula Right arm  . Eye surgery      left eye prothesis  . Fracture surgery      collar bone, right knee replacement  . Bi-ventricular implantable cardioverter defibrillator  (crt-d)  2010    STJ CRTD implanted by Dr [name]  . Ablation  08/2013    AVN ablation by Dr Sara Chu, Dietetic Intern Pager: Carlton Kapolei Se[place]our Clinical Dietitian [phone]

## 2013-11-23 NOTE — Progress Notes (Signed)
Hypoglycemic Event  CBG: 62  Treatment: 15 GM carbohydrate snack  Symptoms: None  Follow-up CBG: Time:0115 CBG Result:85  Possible Reasons for Event: Inadequate meal intake    Carnella [place] I  Remember to initiate Hypoglycemia Order Set & complete

## 2013-11-23 NOTE — Care Management Note (Addendum)
    Page 1 of 1   11/23/2013     3:10:54 PM   CARE MANAGEMENT NOTE 11/23/2013  Patient:  [name] [name], [name] [name]   Account Number:  [id number]  Date Initiated:  11/23/2013  Documentation initiated by:  Dessa Phi  Subjective/Objective Assessment:   80 Y/O M ADMITTED W/SOB.ACUTE BRONCHITIS.     Action/Plan:   FROM HOME W/SPOUSE.   Anticipated DC Date:  11/25/2013   Anticipated DC Plan:  [place]  CM consult      Choice offered to / List presented to:             Status of service:  In process, will continue to follow Medicare Important Message given?   (If response is "NO", the following Medicare IM given date fields will be blank) Date Medicare IM given:   Date Additional Medicare IM given:    Discharge Disposition:    Per UR Regulation:  Reviewed for med. necessity/level of care/duration of stay  If discussed at [place] of Stay Meetings, dates discussed:    Comments:  11/23/13 Sera Hitsman RN,BSN NCM [phone] AWAIT PT RECOMMENDATIONS.

## 2013-11-23 NOTE — Progress Notes (Signed)
Dr. Wendee Beavers notified of additional 4 bts VT

## 2013-11-23 NOTE — Progress Notes (Signed)
Dr. Wendee Beavers notified of 5 bts VT.

## 2013-11-23 NOTE — Progress Notes (Signed)
[place] for Warfarin Indication: atrial fibrillation  Allergies  Allergen Reactions  . Ace Inhibitors     Stopped by Dr. Burt Knack due to progressive renal failure  . Prednisone Other (See Comments)    Raised blood sugar    Patient Measurements: Height: 6' (182.9 cm) Weight: 188 lb 0.8 oz (85.3 kg) IBW/kg (Calculated) : 77.6  Vital Signs: Temp: 97.9 F (36.6 C) (03/04 0450) Temp src: Oral (03/04 0450) BP: 142/73 mmHg (03/04 0450) Pulse Rate: 71 (03/04 0450)  Labs:  Recent Labs  11/22/13 1300 11/23/13 0515  HGB 13.8 12.3*  HCT 42.1 38.6*  PLT 108* 95*  LABPROT 31.3* 31.9*  INR 3.16* 3.24*  CREATININE 3.68* 3.65*  TROPONINI <0.30  --     Estimated Creatinine Clearance: 17.7 ml/min (by C-G formula based on Cr of 3.65).   Medications:  Scheduled:  . aspirin  81 mg Oral Daily  . [START ON 11/24/2013] calcitRIOL  0.25 mcg Oral Once per day on Mon Thu  . dextromethorphan-guaiFENesin  1 tablet Oral BID  . digoxin  0.125 mg Oral QODAY  . hydrALAZINE  37.5 mg Oral TID  . insulin aspart  0-9 Units Subcutaneous TID WC  . insulin aspart protamine- aspart  45 Units Subcutaneous BID WC  . ipratropium-albuterol  3 mL Nebulization Q6H  . isosorbide mononitrate  15 mg Oral Daily  . [START ON 11/24/2013] levofloxacin  250 mg Oral Q48H  . metoprolol succinate  100 mg Oral BID  . potassium chloride SA  20 mEq Oral BID  . sodium chloride  3 mL Intravenous Q12H   Infusions:  . sodium chloride 10 mL/hr at 11/22/13 1728    Assessment: 80 yo male admitted 3/3 with acute bronchitis.  For history of Afib, he is on chronic warfarin anticoagulation.  Dose per clinic notes is 5mg  daily except 2.5mg  M/F, last taken 3.2. INR on admit slightly supratx 3.16.  Pharmacy is consulted to dose warfarin inpatient.  INR increased to 3.24 (supratherapeutic) despite holding dose yesterday (3/3)  Noted drug-drug interaction with Levaquin (increases INR)  CBC:   Hgb 12.3, Plt 95  Goal of Therapy:  INR 2-3   Plan:   Hold warfarin tonight  Daily PT/INR  Gretta Arab PharmD, BCPS Pager [phone] 11/23/2013 7:39 AM

## 2013-11-24 ENCOUNTER — Ambulatory Visit: Payer: Medicare Other | Admitting: Family

## 2013-11-24 DIAGNOSIS — I509 Heart failure, unspecified: Secondary | ICD-10-CM

## 2013-11-24 LAB — CBC
HCT: 42.5 % (ref 39.0–52.0)
HEMOGLOBIN: 13.6 g/dL (ref 13.0–17.0)
MCH: 30.2 pg (ref 26.0–34.0)
MCHC: 32 g/dL (ref 30.0–36.0)
MCV: 94.4 fL (ref 78.0–100.0)
PLATELETS: 97 10*3/uL — AB (ref 150–400)
RBC: 4.5 MIL/uL (ref 4.22–5.81)
RDW: 14.9 % (ref 11.5–15.5)
WBC: 7.5 10*3/uL (ref 4.0–10.5)

## 2013-11-24 LAB — GLUCOSE, CAPILLARY
GLUCOSE-CAPILLARY: 157 mg/dL — AB (ref 70–99)
GLUCOSE-CAPILLARY: 177 mg/dL — AB (ref 70–99)
GLUCOSE-CAPILLARY: 44 mg/dL — AB (ref 70–99)
GLUCOSE-CAPILLARY: 85 mg/dL (ref 70–99)
GLUCOSE-CAPILLARY: 96 mg/dL (ref 70–99)
Glucose-Capillary: 241 mg/dL — ABNORMAL HIGH (ref 70–99)
Glucose-Capillary: 60 mg/dL — ABNORMAL LOW (ref 70–99)

## 2013-11-24 LAB — BASIC METABOLIC PANEL
BUN: 61 mg/dL — ABNORMAL HIGH (ref 6–23)
CO2: 24 mEq/L (ref 19–32)
Calcium: 9.3 mg/dL (ref 8.4–10.5)
Chloride: 104 mEq/L (ref 96–112)
Creatinine, Ser: 3.28 mg/dL — ABNORMAL HIGH (ref 0.50–1.35)
GFR calc Af Amer: 19 mL/min — ABNORMAL LOW (ref >90)
GFR, EST NON AFRICAN AMERICAN: 16 mL/min — AB (ref >90)
GLUCOSE: 86 mg/dL (ref 70–99)
POTASSIUM: 4.3 meq/L (ref 3.7–5.3)
SODIUM: 142 meq/L (ref 137–147)

## 2013-11-24 LAB — PROTIME-INR
INR: 2.4 — AB (ref 0.00–1.49)
PROTHROMBIN TIME: 25.4 s — AB (ref 11.6–15.2)

## 2013-11-24 MED ORDER — WARFARIN - PHARMACIST DOSING INPATIENT: Freq: Every day | Status: DC

## 2013-11-24 MED ORDER — INSULIN ASPART PROT & ASPART (70-30 MIX) 100 UNIT/ML ~~LOC~~ SUSP
25.0000 [IU] | Freq: Two times a day (BID) | SUBCUTANEOUS | Status: DC
  Administered 2013-11-24 – 2013-11-26 (×4): 25 [IU] via SUBCUTANEOUS

## 2013-11-24 MED ORDER — ALPRAZOLAM 0.5 MG PO TABS
0.5000 mg | ORAL_TABLET | Freq: Once | ORAL | Status: AC
  Administered 2013-11-25: 0.5 mg via ORAL
  Filled 2013-11-24: qty 1

## 2013-11-24 MED ORDER — WARFARIN SODIUM 2.5 MG PO TABS
2.5000 mg | ORAL_TABLET | Freq: Once | ORAL | Status: AC
  Administered 2013-11-24: 2.5 mg via ORAL
  Filled 2013-11-24: qty 1

## 2013-11-24 MED ORDER — INSULIN ASPART PROT & ASPART (70-30 MIX) 100 UNIT/ML ~~LOC~~ SUSP: 35.0000 [IU] | Freq: Two times a day (BID) | SUBCUTANEOUS | Status: DC

## 2013-11-24 NOTE — Progress Notes (Signed)
Inpatient Diabetes Program Recommendations  AACE/ADA: New Consensus Statement on Inpatient Glycemic Control (2013)  Target Ranges:  Prepandial:   less than 140 mg/dL      Peak postprandial:   less than 180 mg/dL (1-2 hours)      Critically ill patients:  140 - 180 mg/dL   Reason for Visit: Hypoglycemia in evening yesterday (3/4) after receiving first dose of 70/30 at 45 units bid. Noted pt takes 75/25 insulin 45 units bid at home.  However, pt's po intake is variable and home dose may be too much at this time. Recommend using basal lantus and novolog correction scale while here with varaiable po intake of 10-25-100% intake documented. Home dose of 75/25 bid calculates total basal of 67 units and total humalog of 22.5 units (divided by 3 which is 7 units per meal). At this time, I recommend using approx 1/3 to 1/2 total home basal dose: Lantus at 25 to units per day and sensitive correction tidwc   Can titrate up if/as needed, however strict glucose control may not be the best for this 80 y/o pt with CKD Stage IV  Thank you, Rosita Kea, RN, CNS, Diabetes Coordinator [phone])

## 2013-11-24 NOTE — Progress Notes (Signed)
[facility] PROGRESS NOTE  [name] [name]. TKP:[id number] DOB: 1931/05/20 DOA: 11/22/2013 PCP: Chancy Hurter, MD  Assessment/Plan: Acute bronchitis  - Influenza PCR negative - Discontinued droplet precaution and tamiflu on 11/23/2012 - Continue levaquin - Continue nebs and mucinex  Diabetes mellitus  - Continue sliding scale insulin  - Hypoglycemic on 3/4 treated with CHO snack, likely related to decreased and variable PO intake - Decrease insulin 70/30 to 25 units twice a day.  - liberalized diet - continue to monitor blood sugars and adjust glycemic agents as needed.  Chronic atrial fibrillation with occasional 4-5 beats of VT - Continue digoxin and metoprolol, paced  - Coumadin per pharmacy consultation.  - BiVentricular Defibrillator-St. Jude  CKD stage IV  - Patient at or near baseline.  - Will continue home lasix regimen.  Congestive heart failure  - Not in congestive heart failure at this time  - Continue home regimen including lasix.  Coronary artery disease  -Continue metoprolol, aspirin, Imdur   Hypertension  - Continue metoprolol - Recheck BP, consider increasing hydralazine if sustained BP despite medication  DVT prevention: Warfarin per pharmacy  Code Status: Full Family Communication: discussed with patient at bedside Disposition Plan: Pending continued improvement in condition.   Consultants:  None  Procedures:  None  Antibiotics:  Levaquin- day 3  HPI/Subjective: Patient has no new complaints. Coughing with movement, but improving.  Objective: Filed Vitals:   11/24/13 0459  BP: 180/82  Pulse: 70  Temp: 97.7 F (36.5 C)  Resp: 18    Intake/Output Summary (Last 24 hours) at 11/24/13 1126 Last data filed at 11/24/13 0944  Gross per 24 hour  Intake   1023 ml  Output   1025 ml  Net     -2 ml   Filed Weights   11/22/13 1552 11/24/13 0500  Weight: 85.3 kg (188 lb 0.8 oz) 83.1 kg (183 lb 3.2 oz)     Exam:   General:  Pt in NAD, Alert and awake  Cardiovascular: paced, no rubs  Respiratory: [place] in place at 2L, no wheezes, rhales BL  Abdomen: soft, NT, ND  Musculoskeletal: no cyanosis or clubbing   Data Reviewed: Basic Metabolic Panel:  Recent Labs Lab 11/22/13 1300 11/23/13 0515 11/23/13 1600 11/24/13 0401  NA 143 144  --  142  K 5.0 3.6*  --  4.3  CL 102 104  --  104  CO2 21 25  --  24  GLUCOSE 262* 127*  --  86  BUN 61* 64*  --  61*  CREATININE 3.68* 3.65*  --  3.28*  CALCIUM 9.2 8.7  --  9.3  MG  --   --  2.2  --   PHOS  --   --  3.1  --    Liver Function Tests:  Recent Labs Lab 11/22/13 1300 11/23/13 0515  AST 40* 40*  ALT 28 26  ALKPHOS 36* 29*  BILITOT 1.0 0.8  PROT 6.1 5.4*  ALBUMIN 3.4* 2.9*   No results found for this basename: LIPASE, AMYLASE,  in the last 168 hours No results found for this basename: AMMONIA,  in the last 168 hours CBC:  Recent Labs Lab 11/22/13 1300 11/23/13 0515 11/24/13 0401  WBC 5.7 5.6 7.5  NEUTROABS 4.9  --   --   HGB 13.8 12.3* 13.6  HCT 42.1 38.6* 42.5  MCV 94.8 94.8 94.4  PLT 108* 95* 97*   Cardiac Enzymes:  Recent Labs Lab 11/22/13 1300  TROPONINI <  0.30   BNP (last 3 results)  Recent Labs  09/10/13 0315 09/12/13 0450 11/22/13 1300  PROBNP 10776.0* 6255.0* 17489.0*   CBG:  Recent Labs Lab 11/23/13 2122 11/23/13 2349 11/24/13 0032 11/24/13 0111 11/24/13 0752  GLUCAP 62* 44* 60* 85 96    No results found for this or any previous visit (from the past 240 hour(s)).   Studies: Dg Chest 2 View  11/22/2013   CLINICAL DATA:  Cough  EXAM: CHEST  2 VIEW  COMPARISON:  09/10/2013  FINDINGS: Cardiac enlargement with mild vascular congestion. No edema or effusion. AICD in good position. Negative for pneumonia.  IMPRESSION: Cardiac enlargement with mild vascular congestion. Negative for edema.   Electronically Signed   By: Franchot Gallo M.D.   On: 11/22/2013 13:33    Scheduled Meds: .  aspirin  81 mg Oral Daily  . calcitRIOL  0.25 mcg Oral Once per day on Mon Thu  . dextromethorphan-guaiFENesin  1 tablet Oral BID  . digoxin  0.125 mg Oral QODAY  . feeding supplement (ENSURE COMPLETE)  237 mL Oral BID BM  . furosemide  80 mg Oral BID  . hydrALAZINE  37.5 mg Oral TID  . insulin aspart  0-9 Units Subcutaneous TID WC  . insulin aspart protamine- aspart  35 Units Subcutaneous BID WC  . ipratropium-albuterol  3 mL Nebulization TID  . isosorbide mononitrate  15 mg Oral Daily  . levofloxacin  250 mg Oral Q48H  . metoprolol succinate  100 mg Oral BID  . potassium chloride SA  20 mEq Oral BID  . sodium chloride  3 mL Intravenous Q12H   Continuous Infusions: . sodium chloride 10 mL/hr at 11/22/13 1728    Principal Problem:   Acute bronchitis Active Problems:   DIABETES MELLITUS, TYPE II   HYPERTENSION   CORONARY ARTERY DISEASE   Chronic atrial fibrillation   CONGESTIVE HEART FAILURE   BIVentricular Defibrillator--St Jude   Chronic renal insufficiency, stage IV (severe)   Bronchitis   Time spent: > 35 minutes   Larwance Sachs   [facility] Pager [phone] If 7PM-7AM, please contact night-coverage at [email or web address], password [facility] 11/24/2013, 11:26 AM  LOS: 2 days

## 2013-11-24 NOTE — Evaluation (Signed)
Physical Therapy One Time Evaluation Patient Details Name: [name] [name]. MRN: [id number] DOB: 03-27-1931 Today's Date: 11/24/2013 Time: 3154-0086 PT Time Calculation (min): 8 min  PT Assessment / Plan / Recommendation History of Present Illness  80 year old male who has a past medical history of Chronic atrial fibrillation; CARDIOMYOPATHY, PRIMARY, DILATED; COLON CANCER, HX OF; Chronic systolic CHF (congestive heart failure); CORONARY ARTERY DISEASE; PROSTATE CANCER, HX OF; SKIN CANCER, HX OF; SLEEP APNEA; Hypertension; Hyperlipidemia; Diabetes mellitus; Osteoarthritis; Tubular adenoma of colon; Secondary hyperparathyroidism; CKD (chronic kidney disease), stage IV; Vertigo; Valvular heart disease; and Pulmonary HTN.  presented to the ED with chief complaint of cough going on for past 3 days, he was seen by his primary care physician who gave the cough medicine to the patient and was sent home. Patient continues to have cough along with associated chest  and abdominal soreness from coughing. He denies nausea vomiting, he has not been coughing up any phlegm. Patient has a history of CAD, CHF and is currently on Lasix at home. He also has a history of CAD. Patient excluded for atrial fibrillation. He denies any ongoing chest pain he only complains of chest soreness on coughing.  Clinical Impression  Patient evaluated by Physical Therapy with no further acute PT needs identified. All education has been completed and the patient has no further questions.  Pt ambulated in hallway without assistive device and denies SOB (see below for SpO2). No follow-up Physial Therapy or equipment needs identified. PT is signing off. Thank you for this referral.     PT Assessment  Patent does not need any further PT services    Follow Up Recommendations  No PT follow up    Does the patient have the potential to tolerate intense rehabilitation      Barriers to Discharge        Equipment Recommendations  None  recommended by PT    Recommendations for Other Services     Frequency      Precautions / Restrictions     Pertinent Vitals/Pain SpO2 89-95% room air during ambulation      Mobility  Bed Mobility Overal bed mobility: Modified Independent Transfers Overall transfer level: Modified independent Ambulation/Gait Ambulation/Gait assistance: Supervision Ambulation Distance (Feet): 350 Feet Assistive device: None Gait Pattern/deviations: WFL(Within Functional Limits) General Gait Details: SpO2 89-95% on room air, denies SOB and dizziness    Exercises     PT Diagnosis:    PT Problem List:   PT Treatment Interventions:       PT Goals(Current goals can be found in the care plan section) Acute Rehab PT Goals PT Goal Formulation: No goals set, d/c therapy  Visit Information  Last PT Received On: 11/24/13 Assistance Needed: +1 History of Present Illness: 80 year old male who has a past medical history of Chronic atrial fibrillation; CARDIOMYOPATHY, PRIMARY, DILATED; COLON CANCER, HX OF; Chronic systolic CHF (congestive heart failure); CORONARY ARTERY DISEASE; PROSTATE CANCER, HX OF; SKIN CANCER, HX OF; SLEEP APNEA; Hypertension; Hyperlipidemia; Diabetes mellitus; Osteoarthritis; Tubular adenoma of colon; Secondary hyperparathyroidism; CKD (chronic kidney disease), stage IV; Vertigo; Valvular heart disease; and Pulmonary HTN.  presented to the ED with chief complaint of cough going on for past 3 days, he was seen by his primary care physician who gave the cough medicine to the patient and was sent home. Patient continues to have cough along with associated chest  and abdominal soreness from coughing. He denies nausea vomiting, he has not been coughing up any phlegm. Patient has  a history of CAD, CHF and is currently on Lasix at home. He also has a history of CAD. Patient excluded for atrial fibrillation. He denies any ongoing chest pain he only complains of chest soreness on coughing.        Prior [place] expects to be discharged to:: Private residence Living Arrangements: Spouse/significant other Type of Home: House Home Access: Stairs to enter Technical brewer of Steps: 1 Entrance Stairs-Rails: None Home Layout: Two level;Bed/bath upstairs Alternate Level Stairs-Number of Steps: 6 x2 (with landing between) Alternate Level Stairs-Rails: Left Home Equipment: Walker - 2 wheels;Shower seat;Cane - single point Prior Function Level of Independence: Independent Communication Communication: No difficulties    Cognition  Cognition Arousal/Alertness: Awake/alert Behavior During Therapy: WFL for tasks assessed/performed Overall Cognitive Status: Within Functional Limits for tasks assessed    Extremity/Trunk Assessment Upper Extremity Assessment Upper Extremity Assessment: Overall WFL for tasks assessed Lower Extremity Assessment Lower Extremity Assessment: Overall WFL for tasks assessed   Balance    End of Session PT - End of Session Activity Tolerance: Patient tolerated treatment well Patient left: Other (comment) (in bathroom, RN aware)  GP     Kaydi Kley,KATHrine E 11/24/2013, 3:39 PM Carmelia Bake, PT, DPT 11/24/2013 Pager: [phone]

## 2013-11-24 NOTE — Evaluation (Signed)
Clinical/Bedside Swallow Evaluation Patient Details  Name: [name] [name]. MRN: [id number] Date of Birth: 1931-08-02  Today's Date: 11/24/2013 Time: 9528-4132 SLP Time Calculation (min): 35 min  Past Medical History:  Past Medical History  Diagnosis Date  . Chronic atrial fibrillation     a. Historically difficult rate control. b. s/p CRT-D implantation with anticipation of AV junction ablation but patient then was able to accomplish rate control and ablation not undertaken.; AVN ablation 08/2013 by Dr Lovena Le  . CARDIOMYOPATHY, PRIMARY, DILATED     a. Mixed ICM/NICM - EF 15% by echo 05/2009; St. Jude CRT-D implanted 06/2009. b. Echo 04/2013 - EF 20%, mild LVH.  [name] COLON CANCER, HX OF     a. s/p partial colectomy.  . Chronic systolic CHF (congestive heart failure)   . CORONARY ARTERY DISEASE     a. BMS to LAD 04/2008. b. Cath 06/2009: nonobstructive disease.  [name] PROSTATE CANCER, HX OF     a. s/p radical prostatectomy.  [name] SKIN CANCER, HX OF   . SLEEP APNEA   . Hypertension   . Hyperlipidemia   . Diabetes mellitus   . Osteoarthritis   . Tubular adenoma of colon   . Secondary hyperparathyroidism   . CKD (chronic kidney disease), stage IV     a. Right brachiocephalic AV fistula 12/4008. Neph = [place].  . Vertigo   . Valvular heart disease     a. Echo 04/2013: mild AI, mild MR.  . Pulmonary HTN     a. PA pressure 21mmHg by echo 05/01/13.   Past Surgical History:  Past Surgical History  Procedure Laterality Date  . Colectomy    . Prostatectomy    . Penile prosthesis placement    . Removed prosthetic eye    . Ptca      stent placed  . Total knee arthroplasty  2008    right  . Pacemaker insertion  2010  . Av fistula placement  03/30/2012    Procedure: ARTERIOVENOUS (AV) FISTULA CREATION;  Surgeon: Angelia Mould, MD;  Location: [facility];  Service: Vascular;  Laterality: Right;  Creation of BrachioCephalic Fistula Right arm  . Eye surgery      left eye prothesis  . Fracture  surgery      collar bone, right knee replacement  . Bi-ventricular implantable cardioverter defibrillator  (crt-d)  2010    STJ CRTD implanted by Dr [name]  . Ablation  08/2013    AVN ablation by Dr Lovena Le   HPI:  80 yo male adm to [facility] diagnosed with bronchitis - Pt has h/o CHF, CKD, CAD, pt reports having a recent heart ablation and admits to some issues with swallowing after surgery.   Pt CXR 3/3 negative.  BSE ordered.    Assessment / Plan / Recommendation Clinical Impression  Pt presents with clinical indications of functional oropharyngeal swallow observed consuming water, Ensure, cracker and applesauce. No indications of airway compromise present.   Dysphagia (esophageal symptoms) present x1 month without worsening or improvement per pt.  Symptoms appear consistent with esophageal issues as pt reports feeling "full" pointing from distal to mid esophagus.   "Backflow" occurs if he bends over too soon after eating per pt.   Pt also reports poor intake due to his dysphagia.  Per pt, he was informed that "things were probably jossled around" when he had his surgery and would improve.  Advised pt to compensation strategies to mitigate suspected esophageal dysphagia using diagram, written resources and teach  back.  Advised him to monitor closely and keep MD informed as esophageal work up may be helpful if MD agrees.      Aspiration Risk  Mild due to symptoms of esophageal deficits   Diet Recommendation Regular;Thin liquid   Liquid Administration via: Cup;Straw Medication Administration: Whole meds with liquid Supervision: Patient able to self feed Compensations: Slow rate;Small sips/bites;Follow solids with liquid (intermittent dry swallow may be helpful to clear esophagus) Postural Changes and/or Swallow Maneuvers: Seated upright 90 degrees;Upright 30-60 min after meal    Other  Recommendations Recommended Consults: Consider esophageal assessment Oral Care Recommendations: Oral care BID    Follow Up Recommendations  None    Frequency and Duration   n/a     Pertinent Vitals/Pain Afebrile, decreased    SLP Swallow Goals  n/a   Swallow Study Prior Functional Status   see hhx    General Date of Onset: 10/27/13 HPI: 80 yo male adm to [facility] diagnosed with bronchitis - Pt has h/o CHF, CKD, CAD, pt reports having a recent heart ablation and admits to some issues with swallowing after surgery.   Pt CXR 3/3 negative.  BSE ordered.  Type of Study: Bedside swallow evaluation Diet Prior to this Study: Regular;Thin liquids Temperature Spikes Noted: No Respiratory Status: Room air History of Recent Intubation: No Behavior/Cognition: Alert;Cooperative;Pleasant mood Oral Cavity - Dentition: Dentures, top;Poor condition (lowers poor condition) Self-Feeding Abilities: Able to feed self Patient Positioning: Upright in bed Baseline Vocal Quality: Clear Volitional Cough: Strong Volitional Swallow: Able to elicit    Oral/Motor/Sensory Function Overall Oral Motor/Sensory Function: Appears within functional limits for tasks assessed (? slight lingual deviation to right upon protrusion)   Ice Chips Ice chips: Not tested   Thin Liquid Thin Liquid: Within functional limits Presentation: Self Fed;Cup    Nectar Thick Nectar Thick Liquid: Within functional limits Presentation: Straw   Honey Thick Honey Thick Liquid: Not tested   Puree Puree: Within functional limits Presentation: Self Fed;Spoon   Solid   GO    Solid: Within functional limits Presentation: [place], [place], [facility] SLP [phone]

## 2013-11-24 NOTE — Progress Notes (Signed)
[place] for Warfarin Indication: atrial fibrillation  Allergies  Allergen Reactions  . Ace Inhibitors     Stopped by Dr. Burt Knack due to progressive renal failure  . Prednisone Other (See Comments)    Raised blood sugar    Patient Measurements: Height: 6' (182.9 cm) Weight: 183 lb 3.2 oz (83.1 kg) IBW/kg (Calculated) : 77.6  Vital Signs: Temp: 97.7 F (36.5 C) (03/05 0459) Temp src: Axillary (03/05 0459) BP: 180/82 mmHg (03/05 0459) Pulse Rate: 70 (03/05 0459)  Labs:  Recent Labs  11/22/13 1300 11/23/13 0515 11/24/13 0401  HGB 13.8 12.3* 13.6  HCT 42.1 38.6* 42.5  PLT 108* 95* 97*  LABPROT 31.3* 31.9* 25.4*  INR 3.16* 3.24* 2.40*  CREATININE 3.68* 3.65* 3.28*  TROPONINI <0.30  --   --     Estimated Creatinine Clearance: 19.7 ml/min (by C-G formula based on Cr of 3.28).   Medications:  Scheduled:  . aspirin  81 mg Oral Daily  . calcitRIOL  0.25 mcg Oral Once per day on Mon Thu  . dextromethorphan-guaiFENesin  1 tablet Oral BID  . digoxin  0.125 mg Oral QODAY  . feeding supplement (ENSURE COMPLETE)  237 mL Oral BID BM  . furosemide  80 mg Oral BID  . hydrALAZINE  37.5 mg Oral TID  . insulin aspart  0-9 Units Subcutaneous TID WC  . insulin aspart protamine- aspart  35 Units Subcutaneous BID WC  . ipratropium-albuterol  3 mL Nebulization TID  . isosorbide mononitrate  15 mg Oral Daily  . levofloxacin  250 mg Oral Q48H  . metoprolol succinate  100 mg Oral BID  . potassium chloride SA  20 mEq Oral BID  . sodium chloride  3 mL Intravenous Q12H   Infusions:  . sodium chloride 10 mL/hr at 11/22/13 1728    Assessment: 80 yo male admitted 3/3 with acute bronchitis on chronic warfarin anticoagulation for history of Afib.  Dose per clinic notes is 5mg  daily except 2.5mg  M/F, last taken 3.2. INR on admit slightly supratx 3.16.  Pharmacy is consulted to dose warfarin inpatient.   INR decreased to 2.4 (therapeutic) after  holding doses on 3/3 and 3/4.  Noted drug-drug interaction with Levaquin (increases INR)  CBC:  Hgb 13.6 and Plt 97 are stable.    Goal of Therapy:  INR 2-3   Plan:   Resume warfarin dosing with warfarin 2.5mg  PO today at 1800  Daily PT/INR  Recommend close INR monitoring while on concurrent Levaquin therapy.  Gretta Arab PharmD, BCPS Pager [phone] 11/24/2013 11:24 AM

## 2013-11-25 DIAGNOSIS — R6881 Early satiety: Secondary | ICD-10-CM

## 2013-11-25 DIAGNOSIS — R634 Abnormal weight loss: Secondary | ICD-10-CM

## 2013-11-25 LAB — PROTIME-INR
INR: 1.84 — ABNORMAL HIGH (ref 0.00–1.49)
PROTHROMBIN TIME: 20.7 s — AB (ref 11.6–15.2)

## 2013-11-25 LAB — GLUCOSE, CAPILLARY
GLUCOSE-CAPILLARY: 174 mg/dL — AB (ref 70–99)
GLUCOSE-CAPILLARY: 132 mg/dL — AB (ref 70–99)
Glucose-Capillary: 236 mg/dL — ABNORMAL HIGH (ref 70–99)
Glucose-Capillary: 119 mg/dL — ABNORMAL HIGH (ref 70–99)

## 2013-11-25 MED ORDER — WARFARIN SODIUM 5 MG PO TABS
5.0000 mg | ORAL_TABLET | Freq: Once | ORAL | Status: AC
  Administered 2013-11-25: 5 mg via ORAL
  Filled 2013-11-25: qty 1

## 2013-11-25 MED ORDER — LORAZEPAM 0.5 MG PO TABS
0.5000 mg | ORAL_TABLET | Freq: Once | ORAL | Status: AC
Start: 2013-11-25 — End: 2013-11-25
  Administered 2013-11-25: 0.5 mg via ORAL
  Filled 2013-11-25: qty 1

## 2013-11-25 NOTE — Progress Notes (Signed)
[place] for Warfarin Indication: atrial fibrillation  Allergies  Allergen Reactions  . Ace Inhibitors     Stopped by Dr. Burt Knack due to progressive renal failure  . Prednisone Other (See Comments)    Raised blood sugar    Patient Measurements: Height: 6' (182.9 cm) Weight: 180 lb 5.4 oz (81.8 kg) IBW/kg (Calculated) : 77.6  Vital Signs: Temp: 98.6 F (37 C) (03/06 0622) Temp src: Oral (03/06 0622) BP: 136/76 mmHg (03/06 1001) Pulse Rate: 70 (03/06 1001)  Labs:  Recent Labs  11/22/13 1300 11/23/13 0515 11/24/13 0401 11/25/13 0335  HGB 13.8 12.3* 13.6  --   HCT 42.1 38.6* 42.5  --   PLT 108* 95* 97*  --   LABPROT 31.3* 31.9* 25.4* 20.7*  INR 3.16* 3.24* 2.40* 1.84*  CREATININE 3.68* 3.65* 3.28*  --   TROPONINI <0.30  --   --   --     Estimated Creatinine Clearance: 19.7 ml/min (by C-G formula based on Cr of 3.28).   Medications:  Scheduled:  . aspirin  81 mg Oral Daily  . calcitRIOL  0.25 mcg Oral Once per day on Mon Thu  . dextromethorphan-guaiFENesin  1 tablet Oral BID  . digoxin  0.125 mg Oral QODAY  . feeding supplement (ENSURE COMPLETE)  237 mL Oral BID BM  . furosemide  80 mg Oral BID  . hydrALAZINE  37.5 mg Oral TID  . insulin aspart  0-9 Units Subcutaneous TID WC  . insulin aspart protamine- aspart  25 Units Subcutaneous BID WC  . ipratropium-albuterol  3 mL Nebulization TID  . isosorbide mononitrate  15 mg Oral Daily  . levofloxacin  250 mg Oral Q48H  . metoprolol succinate  100 mg Oral BID  . potassium chloride SA  20 mEq Oral BID  . sodium chloride  3 mL Intravenous Q12H  . Warfarin - Pharmacist Dosing Inpatient   Does not apply q1800   Infusions:  . sodium chloride 10 mL/hr at 11/22/13 1728    Assessment: 80 yo male admitted 3/3 with acute bronchitis on chronic warfarin anticoagulation for history of Afib.  Dose per clinic notes is 5mg  daily except 2.5mg  M/F, last taken 3.2. INR on admit slightly  supratx 3.16.  Pharmacy is consulted to dose warfarin inpatient.   INR decreased to 1.84 (subtherapeutic) after holding doses on 3/3 and 3/4, dosing was resumed 3/5.  Noted drug-drug interaction with Levaquin q48h (increases INR)  CBC:  Hgb 13.6 and Plt 97 stable (3/5).    Goal of Therapy:  INR 2-3   Plan:   Warfarin 5mg  PO today at 1800  Daily PT/INR  Recommend close INR monitoring while on concurrent Levaquin therapy.  Gretta Arab PharmD, BCPS Pager [phone] 11/25/2013 11:03 AM

## 2013-11-25 NOTE — Progress Notes (Signed)
[facility] PROGRESS NOTE  [name] [name]. LPF:[id number] DOB: [birth year] DOA: 11/22/2013 PCP: Chancy Hurter, MD  Assessment/Plan: Acute bronchitis  - Influenza PCR negative - Discontinued droplet precaution and tamiflu on 11/23/2012 - Continue levaquin - Continue nebs and mucinex  Diabetes mellitus  - Continue sliding scale insulin  - Hypoglycemic on 3/4 treated with CHO snack, likely related to decreased and variable PO intake - Decrease insulin 70/30 to 25 units twice a day.  - liberalized diet - continue to monitor blood sugars and adjust glycemic agents as needed.  Chronic atrial fibrillation with occasional 4-5 beats of VT - Continue digoxin and metoprolol, paced  - Coumadin per pharmacy consultation.  - BiVentricular Defibrillator-St. Jude  CKD stage IV  - Patient at or near baseline.  - Will continue home lasix regimen.  Congestive heart failure  - Not in congestive heart failure at this time  - Continue home regimen including lasix.  Coronary artery disease  -Continue metoprolol, aspirin, Imdur   Hypertension  - Continue metoprolol - Recheck BP, consider increasing hydralazine if sustained BP despite medication  Early satiety - Pt also complaining of sensation of food being stuck in esophagus. Denies any problem swallowing.  DVT prevention: Warfarin per pharmacy  Code Status: Full Family Communication: discussed with patient at bedside Disposition Plan: Pending continued improvement in condition and further evaluation by GI specialist.   Consultants:  None  Procedures:  None  Antibiotics:  Levaquin- day 4  HPI/Subjective: Patient states that his breathing is better. His main concern currently is that he gets early satiety and feels like food at times gets stuck mid esophagus.    Objective: Filed Vitals:   11/25/13 1332  BP: 137/93  Pulse: 71  Temp: 97.7 F (36.5 C)  Resp: 18    Intake/Output Summary (Last 24 hours) at  11/25/13 1456 Last data filed at 11/25/13 1332  Gross per 24 hour  Intake   1200 ml  Output   1825 ml  Net   -625 ml   Filed Weights   11/22/13 1552 11/24/13 0500 11/25/13 0622  Weight: 85.3 kg (188 lb 0.8 oz) 83.1 kg (183 lb 3.2 oz) 81.8 kg (180 lb 5.4 oz)    Exam:   General:  Pt in NAD, Alert and awake  Cardiovascular: paced, no rubs  Respiratory: [place] in place at 2L, no wheezes, rhales BL  Abdomen: soft, NT, ND  Musculoskeletal: no cyanosis or clubbing   Data Reviewed: Basic Metabolic Panel:  Recent Labs Lab 11/22/13 1300 11/23/13 0515 11/23/13 1600 11/24/13 0401  NA 143 144  --  142  K 5.0 3.6*  --  4.3  CL 102 104  --  104  CO2 21 25  --  24  GLUCOSE 262* 127*  --  86  BUN 61* 64*  --  61*  CREATININE 3.68* 3.65*  --  3.28*  CALCIUM 9.2 8.7  --  9.3  MG  --   --  2.2  --   PHOS  --   --  3.1  --    Liver Function Tests:  Recent Labs Lab 11/22/13 1300 11/23/13 0515  AST 40* 40*  ALT 28 26  ALKPHOS 36* 29*  BILITOT 1.0 0.8  PROT 6.1 5.4*  ALBUMIN 3.4* 2.9*   No results found for this basename: LIPASE, AMYLASE,  in the last 168 hours No results found for this basename: AMMONIA,  in the last 168 hours CBC:  Recent Labs Lab 11/22/13 1300  11/23/13 0515 11/24/13 0401  WBC 5.7 5.6 7.5  NEUTROABS 4.9  --   --   HGB 13.8 12.3* 13.6  HCT 42.1 38.6* 42.5  MCV 94.8 94.8 94.4  PLT 108* 95* 97*   Cardiac Enzymes:  Recent Labs Lab 11/22/13 1300  TROPONINI <0.30   BNP (last 3 results)  Recent Labs  09/10/13 0315 09/12/13 0450 11/22/13 1300  PROBNP 10776.0* 6255.0* 17489.0*   CBG:  Recent Labs Lab 11/24/13 1220 11/24/13 1700 11/24/13 2127 11/25/13 0812 11/25/13 1142  GLUCAP 177* 241* 157* 236* 119*    No results found for this or any previous visit (from the past 240 hour(s)).   Studies: No results found.  Scheduled Meds: . aspirin  81 mg Oral Daily  . calcitRIOL  0.25 mcg Oral Once per day on Mon Thu  .  dextromethorphan-guaiFENesin  1 tablet Oral BID  . digoxin  0.125 mg Oral QODAY  . feeding supplement (ENSURE COMPLETE)  237 mL Oral BID BM  . furosemide  80 mg Oral BID  . hydrALAZINE  37.5 mg Oral TID  . insulin aspart  0-9 Units Subcutaneous TID WC  . insulin aspart protamine- aspart  25 Units Subcutaneous BID WC  . ipratropium-albuterol  3 mL Nebulization TID  . isosorbide mononitrate  15 mg Oral Daily  . levofloxacin  250 mg Oral Q48H  . metoprolol succinate  100 mg Oral BID  . potassium chloride SA  20 mEq Oral BID  . sodium chloride  3 mL Intravenous Q12H  . warfarin  5 mg Oral ONCE-1800  . Warfarin - Pharmacist Dosing Inpatient   Does not apply q1800   Continuous Infusions: . sodium chloride 10 mL/hr at 11/22/13 1728    Principal Problem:   Acute bronchitis Active Problems:   DIABETES MELLITUS, TYPE II   HYPERTENSION   CORONARY ARTERY DISEASE   Chronic atrial fibrillation   CONGESTIVE HEART FAILURE   BIVentricular Defibrillator--St Jude   Chronic renal insufficiency, stage IV (severe)   Bronchitis   Time spent: > 35 minutes   Velvet Bathe   [facility] Pager [phone] If 7PM-7AM, please contact night-coverage at [email or web address], password [facility] 11/25/2013, 2:56 PM  LOS: 3 days

## 2013-11-25 NOTE — Progress Notes (Signed)
ANTIBIOTIC CONSULT NOTE - Follow up  Pharmacy Consult for Levaquin Indication: Acute bronchitis  Allergies  Allergen Reactions  . Ace Inhibitors     Stopped by Dr. Burt Knack due to progressive renal failure  . Prednisone Other (See Comments)    Raised blood sugar    Patient Measurements: Height: 6' (182.9 cm) Weight: 180 lb 5.4 oz (81.8 kg) IBW/kg (Calculated) : 77.6  Vital Signs: Temp: 98.6 F (37 C) (03/06 0622) Temp src: Oral (03/06 0622) BP: 136/76 mmHg (03/06 1001) Pulse Rate: 70 (03/06 1001) Intake/Output from previous day: 03/05 0701 - 03/06 0700 In: 963 [P.O.:960; I.V.:3] Out: 1525 [Urine:1525]  Labs:  Recent Labs  11/22/13 1300 11/23/13 0515 11/24/13 0401  WBC 5.7 5.6 7.5  HGB 13.8 12.3* 13.6  PLT 108* 95* 97*  CREATININE 3.68* 3.65* 3.28*   Estimated Creatinine Clearance: 19.7 ml/min (by C-G formula based on Cr of 3.28).  Microbiology: No results found for this or any previous visit (from the past 720 hour(s)).  Anti-infectives: 3/3 >> Levaquin >>  Assessment: 80 yo male presents 3/3 with cough x 3 days. Pharmacy consulted to dose Levaquin for acute bronchitis with history of CKD.  Day #4 Levaquin  Tmax: afebrile  WBC: wnl  CKD 4: SCr improved, 3.65 > 3.28 (baseline ~3.4), CrCl 19.7 CG  Goal of Therapy:  Eradication of infection, dose per renal function  Plan:   Continue Levaquin 250mg  PO q48h Follow up renal function & cultures   Gretta Arab PharmD, BCPS Pager [phone] 11/25/2013 11:16 AM

## 2013-11-25 NOTE — Consult Note (Signed)
Consultation  Referring Provider:  [facility] Primary Care Physician:  Chancy Hurter, MD Primary Gastroenterologist:  Verl Blalock, MD       Reason for Consultation:   early satiety           HPI:   [name] [name]. is a 80 y.o. male with multiple significant medical problems, on multiple medications including coumadin. Patient is known to Dr. Sharlett Iles for a remote history of colon cancer for which he is s/p left sigmoid resection in 1999. His last colonoscopy was in 2009 with findings of a small adenoma. Patient was evaluated by [place] for surveillance colonoscopy in October 2013. He was felt to be an extremely poor candidate for any type of sedation or endoscopic procedure secondary to multiple medical problems and chronic anticoagulation.   Patient was admitted to the hospital 3 days ago with bronchitis. While here he described early satiety and weight loss. Weight one month ago documented at 195, it was 193 on 11/21/13. Hospital weight today is 180. Patient cares for his wife who has stage IV lung cancer and he hasn't been eating well lately. Patient underwent cardiac ablation around Christmas time. It was sometime after that patient felt he began to have problems "filling up too fast". No dysphagia but difficult for him to finish a meal with early satiety and sensation of food backing up into his esophagus.  No nausea or vomiting. No abdominal pain. No heartburn except when occasionally drinking something cold. No other GI complaints. BMs normal.   Past Medical History  Diagnosis Date  . Chronic atrial fibrillation     a. Historically difficult rate control. b. s/p CRT-D implantation with anticipation of AV junction ablation but patient then was able to accomplish rate control and ablation not undertaken.; AVN ablation 08/2013 by Dr Lovena Le  . CARDIOMYOPATHY, PRIMARY, DILATED     a. Mixed ICM/NICM - EF 15% by echo 05/2009; St. Jude CRT-D implanted 06/2009. b. Echo 04/2013 - EF  20%, mild LVH.  [name] COLON CANCER, HX OF     a. s/p partial colectomy.  . Chronic systolic CHF (congestive heart failure)   . CORONARY ARTERY DISEASE     a. BMS to LAD 04/2008. b. Cath 06/2009: nonobstructive disease.  [name] PROSTATE CANCER, HX OF     a. s/p radical prostatectomy.  [name] SKIN CANCER, HX OF   . SLEEP APNEA   . Hypertension   . Hyperlipidemia   . Diabetes mellitus   . Osteoarthritis   . Tubular adenoma of colon   . Secondary hyperparathyroidism   . CKD (chronic kidney disease), stage IV     a. Right brachiocephalic AV fistula [id number]. Neph = Dunham.  . Vertigo   . Valvular heart disease     a. Echo 04/2013: mild AI, mild MR.  . Pulmonary HTN     a. PA pressure 90mmHg by echo 05/01/13.    Past Surgical History  Procedure Laterality Date  . Colectomy    . Prostatectomy    . Penile prosthesis placement    . Removed prosthetic eye    . Ptca      stent placed  . Total knee arthroplasty  2008    right  . Pacemaker insertion  2010  . Av fistula placement  03/30/2012    Procedure: ARTERIOVENOUS (AV) FISTULA CREATION;  Surgeon: Angelia Mould, MD;  Location: [place];  Service: Vascular;  Laterality: Right;  Creation of BrachioCephalic Fistula Right arm  .  Eye surgery      left eye prothesis  . Fracture surgery      collar bone, right knee replacement  . Bi-ventricular implantable cardioverter defibrillator  (crt-d)  2010    STJ CRTD implanted by Dr [name]  . Ablation  08/2013    AVN ablation by Dr Lovena Le    Family History  Problem Relation Age of Onset  . Heart disease Father   . Throat cancer Father   . Throat cancer Mother   . Heart disease Mother   . Hypertension Mother      History  Substance Use Topics  . Smoking status: Former Smoker    Types: Cigarettes    Quit date: 09/22/1966  . Smokeless tobacco: Never Used  . Alcohol Use: 0 - .5 oz/week    0-1 drink(s) per week     Comment: occasional    Prior to Admission medications   Medication Sig Start  Date End Date Taking? Authorizing Provider  aspirin 81 MG chewable tablet Chew 81 mg by mouth daily.   Yes Historical Provider, MD  calcitRIOL (ROCALTROL) 0.25 MCG capsule Take 1 capsule (0.25 mcg total) by mouth as directed. Taking only on Monday and Thursday 10/10/13  Yes Burtis Junes, NP  digoxin (LANOXIN) 0.125 MG tablet Take 1 tablet (0.125 mg total) by mouth every other day. 09/13/13  Yes Zerita Boers, MD  furosemide (LASIX) 80 MG tablet Take 1 tablet (80 mg total) by mouth 2 (two) times daily. 10/25/13  Yes Sherren Mocha, MD  GuaiFENesin (MUCINEX PO) Take by mouth daily as needed (ocngestion).   Yes Historical Provider, MD  hydrALAZINE (APRESOLINE) 25 MG tablet Take 1.5 tablets (37.5 mg total) by mouth 3 (three) times daily. 10/10/13  Yes Burtis Junes, NP  insulin lispro protamine-lispro (HUMALOG 75/25) (75-25) 100 UNIT/ML SUSP injection Inject 45 Units into the skin 2 (two) times daily with a meal.   Yes Historical Provider, MD  isosorbide mononitrate (IMDUR) 30 MG 24 hr tablet Take 0.5 tablets (15 mg total) by mouth daily. 01/31/13  Yes Lisabeth Pick, MD  meclizine (ANTIVERT) 25 MG tablet Take 1 tablet (25 mg total) by mouth 3 (three) times daily as needed for dizziness or nausea. 06/03/13  Yes Doe-Hyun R Shawna Orleans, DO  metoprolol succinate (TOPROL-XL) 100 MG 24 hr tablet Take 100 mg by mouth 2 (two) times daily. Take with or immediately following a meal.   Yes Historical Provider, MD  nitroGLYCERIN (NITROSTAT) 0.4 MG SL tablet Place 0.4 mg under the tongue every 5 (five) minutes x 3 doses as needed for chest pain.   Yes Historical Provider, MD  potassium chloride SA (K-DUR,KLOR-CON) 20 MEQ tablet Take 20 mEq by mouth 2 (two) times daily.    Yes Historical Provider, MD  warfarin (COUMADIN) 5 MG tablet Take 2.5-5 mg by mouth daily. Monday and Friday take 2.5 mg; Tuesday, Wednesday, Thursday, Saturday, and Sunday takes 5 mg.   Yes Historical Provider, MD  amoxicillin (AMOXIL) 500 MG  capsule Take 1 capsule (500 mg total) by mouth 3 (three) times daily. 11/22/13   Ricard Dillon, MD  glucose blood (FREESTYLE TEST STRIPS) test strip 1 each by Other route as needed. Use as instructed     Historical Provider, MD    Current Facility-Administered Medications  Medication Dose Route Frequency Provider Last Rate Last Dose  . 0.9 %  sodium chloride infusion  250 mL Intravenous PRN Oswald Hillock, MD      .  0.9 %  sodium chloride infusion   Intravenous Continuous Oswald Hillock, MD 10 mL/hr at 11/22/13 1728    . acetaminophen (TYLENOL) tablet 650 mg  650 mg Oral Q6H PRN Gardiner Barefoot, NP   650 mg at 11/22/13 2131  . albuterol (PROVENTIL) (2.5 MG/3ML) 0.083% nebulizer solution 2.5 mg  2.5 mg Nebulization Q6H PRN Velvet Bathe, MD   2.5 mg at 11/24/13 0454  . aspirin chewable tablet 81 mg  81 mg Oral Daily Oswald Hillock, MD   81 mg at 11/25/13 1001  . calcitRIOL (ROCALTROL) capsule 0.25 mcg  0.25 mcg Oral Once per day on Mon Thu Gagan S Lama, MD   0.25 mcg at 11/24/13 0935  . dextromethorphan-guaiFENesin (MUCINEX DM) 30-600 MG per 12 hr tablet 1 tablet  1 tablet Oral BID Oswald Hillock, MD   1 tablet at 11/25/13 1002  . dextrose (GLUTOSE) 40 % oral gel 37.5 g  1 Tube Oral PRN Velvet Bathe, MD      . digoxin (LANOXIN) tablet 0.125 mg  0.125 mg Oral QODAY Oswald Hillock, MD   0.125 mg at 11/24/13 0935  . feeding supplement (ENSURE COMPLETE) (ENSURE COMPLETE) liquid 237 mL  237 mL Oral BID BM Hazle Coca, RD   237 mL at 11/24/13 1000  . furosemide (LASIX) tablet 80 mg  80 mg Oral BID Velvet Bathe, MD   80 mg at 11/25/13 0831  . hydrALAZINE (APRESOLINE) tablet 37.5 mg  37.5 mg Oral TID Oswald Hillock, MD   37.5 mg at 11/25/13 1001  . HYDROcodone-acetaminophen (NORCO/VICODIN) 5-325 MG per tablet 1-2 tablet  1-2 tablet Oral Q4H PRN Oswald Hillock, MD      . insulin aspart (novoLOG) injection 0-9 Units  0-9 Units Subcutaneous TID WC Oswald Hillock, MD   3 Units at 11/25/13 0831  . insulin aspart  protamine- aspart (NOVOLOG MIX 70/30) injection 25 Units  25 Units Subcutaneous BID WC Velvet Bathe, MD   25 Units at 11/25/13 0831  . ipratropium-albuterol (DUONEB) 0.5-2.5 (3) MG/3ML nebulizer solution 3 mL  3 mL Nebulization TID Velvet Bathe, MD   3 mL at 11/25/13 1330  . isosorbide mononitrate (IMDUR) 24 hr tablet 15 mg  15 mg Oral Daily Oswald Hillock, MD   15 mg at 11/25/13 1001  . levofloxacin (LEVAQUIN) tablet 250 mg  250 mg Oral Q48H Emiliano Dyer, RPH   250 mg at 11/24/13 0935  . meclizine (ANTIVERT) tablet 25 mg  25 mg Oral TID PRN Oswald Hillock, MD      . menthol-cetylpyridinium (CEPACOL) lozenge 3 mg  1 lozenge Oral PRN Gardiner Barefoot, NP   3 mg at 11/23/13 0551  . metoprolol succinate (TOPROL-XL) 24 hr tablet 100 mg  100 mg Oral BID Oswald Hillock, MD   100 mg at 11/25/13 1001  . nitroGLYCERIN (NITROSTAT) SL tablet 0.4 mg  0.4 mg Sublingual Q5 Min x 3 PRN Oswald Hillock, MD      . potassium chloride SA (K-DUR,KLOR-CON) CR tablet 20 mEq  20 mEq Oral BID Oswald Hillock, MD   20 mEq at 11/25/13 1001  . sodium chloride 0.9 % injection 3 mL  3 mL Intravenous Q12H Oswald Hillock, MD   3 mL at 11/25/13 1002  . sodium chloride 0.9 % injection 3 mL  3 mL Intravenous PRN Oswald Hillock, MD      . warfarin (COUMADIN) tablet 5 mg  5  mg Oral ONCE-1800 Randa Spike, RPH      . Warfarin - Pharmacist Dosing Inpatient   Does not apply q1800 Randa Spike, RPH        Allergies as of 11/22/2013 - Review Complete 11/22/2013  Allergen Reaction Noted  . Ace inhibitors  11/03/2013  . Prednisone Other (See Comments) 05/14/2011   Review of Systems:    All systems reviewed and negative except where noted in HPI.   Physical Exam:  Vital signs in last 24 hours: Temp:  [97.7 F (36.5 C)-98.6 F (37 C)] 97.7 F (36.5 C) (03/06 1332) Pulse Rate:  [70-71] 71 (03/06 1332) Resp:  [18-20] 18 (03/06 1332) BP: (136-167)/(65-93) 137/93 mmHg (03/06 1332) SpO2:  [94 %-100 %] 100 % (03/06 1332) Weight:   [180 lb 5.4 oz (81.8 kg)] 180 lb 5.4 oz (81.8 kg) (03/06 0622) Last BM Date: 11/22/13 General:   Pleasant white male in NAD Head:  Normocephalic and atraumatic. Eyes:   No icterus.   Conjunctiva pink. Ears:  Slightly diminished auditory acuity. Neck:  Supple; no masses felt Lungs:  Respirations even and unlabored. Lungs clear to auscultation bilaterally.   No wheezes, crackles, or rhonchi.  Heart:  Regularly irregular.  Abdomen:  Soft, nondistended, nontender. Normal bowel sounds. No appreciable masses or hepatomegaly.  Rectal:  Not performed.  Msk:  Symmetrical without gross deformities.  Extremities:  Without edema. Neurologic:  Alert and  oriented x4;  grossly normal neurologically. Skin:  Intact without significant lesions or rashes. Cervical Nodes:  No significant cervical adenopathy. Psych:  Alert and cooperative. Normal affect.  LAB RESULTS:  Recent Labs  11/23/13 0515 11/24/13 0401  WBC 5.6 7.5  HGB 12.3* 13.6  HCT 38.6* 42.5  PLT 95* 97*   BMET  Recent Labs  11/23/13 0515 11/24/13 0401  NA 144 142  K 3.6* 4.3  CL 104 104  CO2 25 24  GLUCOSE 127* 86  BUN 64* 61*  CREATININE 3.65* 3.28*  CALCIUM 8.7 9.3   LFT  Recent Labs  11/23/13 0515  PROT 5.4*  ALBUMIN 2.9*  AST 40*  ALT 26  ALKPHOS 29*  BILITOT 0.8   PT/INR  Recent Labs  11/24/13 0401 11/25/13 0335  LABPROT 25.4* 20.7*  INR 2.40* 1.84*     Impression / Plan:   1. Early satiety with weight loss. Documented outpatient weight on 11/21/13 was 193, today it is 180. Doubt 13 pound loss in last 4 days but he has surely lost several pounds. Etiology of weight loss / early satiety not clear. This could be multifactorial (poor diet, stress, ? CHF). Underlying malignancy isn't excluded. Patient is high risk for endoscopic procedures and anticoagulation complicates things further. He may need upper GI series to exclude any gross lesions. If negative then may need CTscan for further evaluation.   2.  Acute bronchitis  3. Chronic thrombocytopenia, slightly worse this admission (count of 97). He did have slightly enlarged spleen on an ultrasound back in November 2014.   4. Multiple significant medical problems including but not limited to CKD, diabetes, CAD, atrial fibrillation on chronic coumadin. He is s/p defibrillator placement.  5. History of CHF, EF 30-35%. BNP 17489. Home diuretics being continued.   Thanks   LOS: 3 days   [name] [name]  11/25/2013, 4:00 PM  GI ATTENDING  History, laboratories, x-rays, prior endoscopy reports reviewed. Patient seen and examined. Agree with H&P as above. We are asked to see the patient for early satiety and  weight loss. Seems to have anorexia. Unlikely to be a primary GI disorder. Rather, multifactorial related to his multiple medical problems and multiple medications. May have an element of depression. Exam noncontributory. Agree with plans for upper GI series to rule out gross abnormalities. Otherwise, supportive care.  Docia Chuck. Geri Seminole., M.D. [facility] Division of Gastroenterology

## 2013-11-26 ENCOUNTER — Inpatient Hospital Stay (HOSPITAL_COMMUNITY): Payer: Medicare Other

## 2013-11-26 LAB — GLUCOSE, CAPILLARY
GLUCOSE-CAPILLARY: 149 mg/dL — AB (ref 70–99)
Glucose-Capillary: 168 mg/dL — ABNORMAL HIGH (ref 70–99)

## 2013-11-26 LAB — PROTIME-INR
INR: 1.57 — ABNORMAL HIGH (ref 0.00–1.49)
Prothrombin Time: 18.3 s — ABNORMAL HIGH (ref 11.6–15.2)

## 2013-11-26 MED ORDER — WARFARIN SODIUM 6 MG PO TABS
6.0000 mg | ORAL_TABLET | Freq: Once | ORAL | Status: DC
  Filled 2013-11-26: qty 1

## 2013-11-26 MED ORDER — ENSURE COMPLETE PO LIQD: 237.0000 mL | Freq: Two times a day (BID) | ORAL | Status: DC

## 2013-11-26 MED ORDER — INSULIN LISPRO PROT & LISPRO (75-25 MIX) 100 UNIT/ML ~~LOC~~ SUSP: 25.0000 [IU] | Freq: Two times a day (BID) | SUBCUTANEOUS | Status: DC

## 2013-11-26 MED ORDER — LEVOFLOXACIN 250 MG PO TABS: 250.0000 mg | ORAL_TABLET | ORAL | Status: DC

## 2013-11-26 NOTE — Progress Notes (Signed)
[place] for Warfarin Indication: atrial fibrillation  Allergies  Allergen Reactions  . Ace Inhibitors     Stopped by Dr. Burt Knack due to progressive renal failure  . Prednisone Other (See Comments)    Raised blood sugar    Patient Measurements: Height: 6' (182.9 cm) Weight: 181 lb (82.101 kg) IBW/kg (Calculated) : 77.6  Vital Signs: Temp: 97.9 F (36.6 C) (03/07 0517) Temp src: Oral (03/07 0517) BP: 149/73 mmHg (03/07 0517) Pulse Rate: 70 (03/07 0517)  Labs:  Recent Labs  11/24/13 0401 11/25/13 0335 11/26/13 0506  HGB 13.6  --   --   HCT 42.5  --   --   PLT 97*  --   --   LABPROT 25.4* 20.7* 18.3*  INR 2.40* 1.84* 1.57*  CREATININE 3.28*  --   --     Estimated Creatinine Clearance: 19.7 ml/min (by C-G formula based on Cr of 3.28).   Medications:  Scheduled:  . aspirin  81 mg Oral Daily  . calcitRIOL  0.25 mcg Oral Once per day on Mon Thu  . dextromethorphan-guaiFENesin  1 tablet Oral BID  . digoxin  0.125 mg Oral QODAY  . feeding supplement (ENSURE COMPLETE)  237 mL Oral BID BM  . furosemide  80 mg Oral BID  . hydrALAZINE  37.5 mg Oral TID  . insulin aspart  0-9 Units Subcutaneous TID WC  . insulin aspart protamine- aspart  25 Units Subcutaneous BID WC  . isosorbide mononitrate  15 mg Oral Daily  . levofloxacin  250 mg Oral Q48H  . metoprolol succinate  100 mg Oral BID  . potassium chloride SA  20 mEq Oral BID  . sodium chloride  3 mL Intravenous Q12H  . Warfarin - Pharmacist Dosing Inpatient   Does not apply q1800   Infusions:  . sodium chloride 10 mL/hr at 11/22/13 1728    Assessment: 80 yo male admitted 3/3 with acute bronchitis on chronic warfarin anticoagulation for history of Afib.  Dose per clinic notes is 5mg  daily except 2.5mg  M/F, last taken 3/2. INR on admit slightly supratx 3.16.  Pharmacy is consulted to dose warfarin inpatient.  INR cont to decrease despite 2.5mg , then 5mg  yesterday. Should start to  respond soon.  Noted drug-drug interaction with Levaquin q48h (increases INR)  CBC stable as of 3/5.    Goal of Therapy:  INR 2-3   Plan:   Warfarin 6mg  PO today at 1800. Very slightly higher dose. Should start to see INR rise by labs 3/8.  Daily PT/INR  Recommend close INR monitoring while on concurrent Levaquin therapy.  Romeo Rabon, PharmD, pager [phone]. 11/26/2013,10:06 AM.

## 2013-11-26 NOTE — Progress Notes (Signed)
HISTORY OF PRESENT ILLNESS:  [name] Fouts. is a 80 y.o. male we're evaluating for anorexia with early satiety weight loss. Actually his entire lunch today. No GI complaints. He has tried nutritional supplements, but did not care for them Upper GI series reviewed. This was unremarkable without obstruction, mass, or other abnormality  REVIEW OF SYSTEMS:  All non-GI ROS negative except for  Past Medical History  Diagnosis Date  . Chronic atrial fibrillation     a. Historically difficult rate control. b. s/p CRT-D implantation with anticipation of AV junction ablation but patient then was able to accomplish rate control and ablation not undertaken.; AVN ablation 08/2013 by Dr Lovena Le  . CARDIOMYOPATHY, PRIMARY, DILATED     a. Mixed ICM/NICM - EF 15% by echo 05/2009; St. Jude CRT-D implanted 06/2009. b. Echo 04/2013 - EF 20%, mild LVH.  [name] COLON CANCER, HX OF     a. s/p partial colectomy.  . Chronic systolic CHF (congestive heart failure)   . CORONARY ARTERY DISEASE     a. BMS to LAD 04/2008. b. Cath 06/2009: nonobstructive disease.  [name] PROSTATE CANCER, HX OF     a. s/p radical prostatectomy.  [name] SKIN CANCER, HX OF   . SLEEP APNEA   . Hypertension   . Hyperlipidemia   . Diabetes mellitus   . Osteoarthritis   . Tubular adenoma of colon   . Secondary hyperparathyroidism   . CKD (chronic kidney disease), stage IV     a. Right brachiocephalic AV fistula 11/8099. Neph = Dunham.  . Vertigo   . Valvular heart disease     a. Echo 04/2013: mild AI, mild MR.  . Pulmonary HTN     a. PA pressure 78mmHg by echo 05/01/13.    Past Surgical History  Procedure Laterality Date  . Colectomy    . Prostatectomy    . Penile prosthesis placement    . Removed prosthetic eye    . Ptca      stent placed  . Total knee arthroplasty  2008    right  . Pacemaker insertion  2010  . Av fistula placement  03/30/2012    Procedure: ARTERIOVENOUS (AV) FISTULA CREATION;  Surgeon: Angelia Mould, MD;   Location: [place];  Service: Vascular;  Laterality: Right;  Creation of BrachioCephalic Fistula Right arm  . Eye surgery      left eye prothesis  . Fracture surgery      collar bone, right knee replacement  . Bi-ventricular implantable cardioverter defibrillator  (crt-d)  2010    STJ CRTD implanted by Dr [name]  . Ablation  08/2013    AVN ablation by Dr Lovena Le    Social History [name] [name].  reports that he quit smoking about 47 years ago. His smoking use included Cigarettes. He smoked 0.00 packs per day. He has never used smokeless tobacco. He reports that he drinks alcohol. He reports that he does not use illicit drugs.  family history includes Heart disease in his father and mother; Hypertension in his mother; Throat cancer in his father and mother.  Allergies  Allergen Reactions  . Ace Inhibitors     Stopped by Dr. Burt Knack due to progressive renal failure  . Prednisone Other (See Comments)    Raised blood sugar       PHYSICAL EXAMINATION: Vital signs: BP 150/72  Pulse 76  Temp(Src) 97.9 F (36.6 C) (Oral)  Resp 19  Ht 6' (1.829 m)  Wt 181 lb (82.101  kg)  BMI 24.54 kg/m2  SpO2 99% General: Well-developed, well-nourished, no acute distress HEENT: Sclerae are anicteric, conjunctiva pink. Oral mucosa intact Lungs: Clear Heart: Regular Abdomen: soft, nontender, nondistended, no obvious ascites, no peritoneal signs, normal bowel sounds. No organomegaly. Extremities: No edema Psychiatric: alert and oriented x3. Cooperative     ASSESSMENT:  Early satiety/anorexia/weight loss. Multifactorial due to multiple medical problems, multiple medications, and issues with his wife's illness. Negative upper GI series. Able eat lunch today without difficulty   PLAN:  Reassurance. With time and resolution of acute medical problems as well as social stressors, would expect improvement appetite and stabilization of weight. No further GI plan to recommendations at this point. Will  sign off  [name] [name] N. Geri Seminole., M.D. [facility] Division of Gastroenterology

## 2013-11-26 NOTE — Discharge Summary (Signed)
Physician Discharge Summary  [name] [name]. MWN:[id number] DOB: 03-13-1931 DOA: 11/22/2013  PCP: Chancy Hurter, MD  Admit date: 11/22/2013 Discharge date: 11/26/2013  Time spent: > 65  minutes  Recommendations for Outpatient Follow-up:  1. Follow up with PCP for INR check within the next 1-2 days 2. Decrease Insulin 75/25 to 25 units BID 3. Take Levaquin every 48 hours starting on 11/28/2013 to complete a 10 day course (started on 11/22/2013)  Discharge Diagnoses:  Principal Problem:   Acute bronchitis Active Problems:   DIABETES MELLITUS, TYPE II   HYPERTENSION   CORONARY ARTERY DISEASE   Chronic atrial fibrillation   CONGESTIVE HEART FAILURE   BIVentricular Defibrillator--St Jude   Chronic renal insufficiency, stage IV (severe)   Bronchitis   Early satiety   Discharge Condition: Stable  Diet recommendation: Low sodium, heart healthy  Filed Weights   11/24/13 0500 11/25/13 0622 11/26/13 0517  Weight: 83.1 kg (183 lb 3.2 oz) 81.8 kg (180 lb 5.4 oz) 82.101 kg (181 lb)    History of present illness:  80 year old make has a history of atrial fibrillation, cardiomyopathy, colon cancer, CHF, CAD, prostate cancer, skin cancer, sleep apnea, HTN, Diabetes, hyperlipidemia, osteoarthritis, secondary hyperparathyroidism, CKD, vertigo, valvular heart disease, and pulmonary HTN. He presented with complaints of a cough for 3 days, was seen by PCP and given cough medication. He continued to have a cough with associated chest and abdominal soreness from coughing. He was admitted on 11/22/2013 for acute bronchitis and treated with oral antibiotics.    Hospital Course:  Acute bronchitis  - Influenza PCR negative  - Discontinued droplet precaution and tamiflu on 11/23/2012  - Levaquin started on 11/22/2013 for 10 day course - Given albuterol nebs PRN and mucinex   Diabetes mellitus  - Sliding scale insulin while in hospital - Hypoglycemic on 3/4 treated with CHO snack, likely related to  decreased and variable PO intake  - Decreased insulin 70/30 to 25 units twice a day on 3/4.  - liberalized diet while patient was in house - Monitored blood sugars.   Chronic atrial fibrillation with occasional 4-5 beats of VT  - Continued digoxin and metoprolol, paced  - Coumadin per pharmacy consultation.  - BiVentricular Defibrillator-St. Jude  - Subtherapeutic INR, discussed with patient recommendation to stay for better control of his INR but patient requested discharge despite the fact he is at increased risk of getting stroke. Status he will take extra dose of Coumadin and have his Coumadin recheck for the next one to 2 days. Patient recently given higher dose of Coumadin and I suspect his INR should trend up.  CKD stage IV  - Patient at or near baseline.  - Continued home lasix regimen.   Congestive heart failure  - Not in congestive heart failure at this time  - Continued home regimen including lasix.   Coronary artery disease  -Continued metoprolol, aspirin, Imdur   Hypertension  - Resolved - Continued metoprolol   Early satiety/weight loss  - Pt complained of sensation of food being stuck in esophagus. Denies any problem swallowing. Post procedure pt denies any sensation of food being stuck in esophagus.  -Gastroenterology consulted on 3/6 - Upper GI series performed on 3/7: unremarkable results without obstruction, mass, or other abnormality.  - Per GI: no further recommendations or follow up necessary. - Likely multifactorial due to medical problems, multiple medications, stress, and wife's illness.   DVT prevention: Warfarin per pharmacy   Procedures:  Upper GI series-  unremarkable without obstruction, mass, or other abnormality  Consultations:  Gastroenterology  Discharge Exam: Filed Vitals:   11/26/13 1108  BP:   Pulse: 76  Temp:   Resp:     General: pt in NAD, alert and oriented Cardiovascular: paced, no rubs Respiratory: Lungs clear, No wheezes  or rhales.  Discharge Instructions   Future Appointments Provider Department Dept Phone   12/08/2013 9:45 AM [facility] [place] Office [phone]   01/31/2014 9:00 AM [facility] [place], [place] [phone]       Medication List    ASK your doctor about these medications       amoxicillin 500 MG capsule  Commonly known as:  AMOXIL  Take 1 capsule (500 mg total) by mouth 3 (three) times daily.     aspirin 81 MG chewable tablet  Chew 81 mg by mouth daily.     calcitRIOL 0.25 MCG capsule  Commonly known as:  ROCALTROL  Take 1 capsule (0.25 mcg total) by mouth as directed. Taking only on Monday and Thursday     digoxin 0.125 MG tablet  Commonly known as:  LANOXIN  Take 1 tablet (0.125 mg total) by mouth every other day.     FREESTYLE TEST STRIPS test strip  Generic drug:  glucose blood  1 each by Other route as needed. Use as instructed     furosemide 80 MG tablet  Commonly known as:  LASIX  Take 1 tablet (80 mg total) by mouth 2 (two) times daily.     hydrALAZINE 25 MG tablet  Commonly known as:  APRESOLINE  Take 1.5 tablets (37.5 mg total) by mouth 3 (three) times daily.     insulin lispro protamine-lispro (75-25) 100 UNIT/ML Susp injection  Commonly known as:  HUMALOG 75/25 MIX  Inject 45 Units into the skin 2 (two) times daily with a meal.     isosorbide mononitrate 30 MG 24 hr tablet  Commonly known as:  IMDUR  Take 0.5 tablets (15 mg total) by mouth daily.     meclizine 25 MG tablet  Commonly known as:  ANTIVERT  Take 1 tablet (25 mg total) by mouth 3 (three) times daily as needed for dizziness or nausea.     metoprolol succinate 100 MG 24 hr tablet  Commonly known as:  TOPROL-XL  Take 100 mg by mouth 2 (two) times daily. Take with or immediately following a meal.     MUCINEX PO  Take by mouth daily as needed (ocngestion).     nitroGLYCERIN 0.4 MG SL tablet  Commonly known as:  NITROSTAT  Place 0.4 mg under  the tongue every 5 (five) minutes x 3 doses as needed for chest pain.     potassium chloride SA 20 MEQ tablet  Commonly known as:  K-DUR,KLOR-CON  Take 20 mEq by mouth 2 (two) times daily.     warfarin 5 MG tablet  Commonly known as:  COUMADIN  Take 2.5-5 mg by mouth daily. Monday and Friday take 2.5 mg; Tuesday, Wednesday, Thursday, Saturday, and Sunday takes 5 mg.       Allergies  Allergen Reactions  . Ace Inhibitors     Stopped by Dr. Burt Knack due to progressive renal failure  . Prednisone Other (See Comments)    Raised blood sugar      The results of significant diagnostics from this hospitalization (including imaging, microbiology, ancillary and laboratory) are listed below for reference.    Significant Diagnostic Studies: Dg Chest  2 View  11/22/2013   CLINICAL DATA:  Cough  EXAM: CHEST  2 VIEW  COMPARISON:  09/10/2013  FINDINGS: Cardiac enlargement with mild vascular congestion. No edema or effusion. AICD in good position. Negative for pneumonia.  IMPRESSION: Cardiac enlargement with mild vascular congestion. Negative for edema.   Electronically Signed   By: Franchot Gallo M.D.   On: 11/22/2013 13:33   Dg Ugi W/high Density W/kub  11/26/2013   CLINICAL DATA:  Weight loss. History of colon cancer and prostate cancer.  EXAM: UPPER GI SERIES W/HIGH DENSITY W/KUB  TECHNIQUE: After obtaining a scout radiograph a routine upper GI series was performed using thin and high density barium.  COMPARISON:  None.  FLUOROSCOPY TIME:  2 min, 30 seconds  FINDINGS: Fluoroscopic evaluation of swallowing demonstrates normal cervical esophagus. There is disruption of primary esophageal peristaltic waves (2 out of 4) with occasional tertiary contractions. No fixed esophageal stricture, fold thickening or mass. No reflux with the water siphon maneuver.  Stomach, duodenal bulb and duodenal sweep are unremarkable. No ulceration, mass or fold thickening.  IMPRESSION: Mild esophageal dysmotility, nonspecific.   Otherwise unremarkable study.   Electronically Signed   By: Rolm Baptise M.D.   On: 11/26/2013 11:39    Microbiology: No results found for this or any previous visit (from the past 240 hour(s)).   Labs: Basic Metabolic Panel:  Recent Labs Lab 11/22/13 1300 11/23/13 0515 11/23/13 1600 11/24/13 0401  NA 143 144  --  142  K 5.0 3.6*  --  4.3  CL 102 104  --  104  CO2 21 25  --  24  GLUCOSE 262* 127*  --  86  BUN 61* 64*  --  61*  CREATININE 3.68* 3.65*  --  3.28*  CALCIUM 9.2 8.7  --  9.3  MG  --   --  2.2  --   PHOS  --   --  3.1  --    Liver Function Tests:  Recent Labs Lab 11/22/13 1300 11/23/13 0515  AST 40* 40*  ALT 28 26  ALKPHOS 36* 29*  BILITOT 1.0 0.8  PROT 6.1 5.4*  ALBUMIN 3.4* 2.9*   No results found for this basename: LIPASE, AMYLASE,  in the last 168 hours No results found for this basename: AMMONIA,  in the last 168 hours CBC:  Recent Labs Lab 11/22/13 1300 11/23/13 0515 11/24/13 0401  WBC 5.7 5.6 7.5  NEUTROABS 4.9  --   --   HGB 13.8 12.3* 13.6  HCT 42.1 38.6* 42.5  MCV 94.8 94.8 94.4  PLT 108* 95* 97*   Cardiac Enzymes:  Recent Labs Lab 11/22/13 1300  TROPONINI <0.30   BNP: BNP (last 3 results)  Recent Labs  09/10/13 0315 09/12/13 0450 11/22/13 1300  PROBNP 10776.0* 6255.0* 17489.0*   CBG:  Recent Labs Lab 11/25/13 1142 11/25/13 1656 11/25/13 2106 11/26/13 0741 11/26/13 1147  GLUCAP 119* 174* 132* 149* 168*       Signed:  Velvet Bathe  [facility] 11/26/2013, 12:05 PM

## 2013-11-29 ENCOUNTER — Ambulatory Visit (INDEPENDENT_AMBULATORY_CARE_PROVIDER_SITE_OTHER): Payer: Medicare Other | Admitting: Family Medicine

## 2013-11-29 ENCOUNTER — Ambulatory Visit (INDEPENDENT_AMBULATORY_CARE_PROVIDER_SITE_OTHER): Payer: Medicare Other | Admitting: Family

## 2013-11-29 ENCOUNTER — Telehealth: Payer: Self-pay | Admitting: Internal Medicine

## 2013-11-29 ENCOUNTER — Encounter: Payer: Self-pay | Admitting: Family Medicine

## 2013-11-29 VITALS — BP 138/76 | HR 70 | Temp 97.6°F | Wt 184.0 lb

## 2013-11-29 DIAGNOSIS — I10 Essential (primary) hypertension: Secondary | ICD-10-CM

## 2013-11-29 DIAGNOSIS — I482 Chronic atrial fibrillation, unspecified: Secondary | ICD-10-CM

## 2013-11-29 DIAGNOSIS — Z5181 Encounter for therapeutic drug level monitoring: Secondary | ICD-10-CM

## 2013-11-29 DIAGNOSIS — E119 Type 2 diabetes mellitus without complications: Secondary | ICD-10-CM

## 2013-11-29 DIAGNOSIS — I4891 Unspecified atrial fibrillation: Secondary | ICD-10-CM

## 2013-11-29 DIAGNOSIS — J209 Acute bronchitis, unspecified: Secondary | ICD-10-CM

## 2013-11-29 DIAGNOSIS — E785 Hyperlipidemia, unspecified: Secondary | ICD-10-CM

## 2013-11-29 LAB — POCT INR: INR: 2

## 2013-11-29 NOTE — Progress Notes (Signed)
Pre visit review using our clinic review tool, if applicable. No additional management support is needed unless otherwise documented below in the visit note. 

## 2013-11-29 NOTE — Progress Notes (Signed)
Subjective:    Patient ID: [name] [name]., male    DOB: [birth year], 80 y.o.   MRN: [id number]  HPI Patient here for hospital followup. He has multiple chronic problems include history of type 2 diabetes, CAD, CHF, hypertension, atrial fibrillation, chronic Coumadin therapy, dyslipidemia, chronic kidney disease, osteoarthritis, remote history of prostate cancer. Patient was admitted on 11/22/2013 and discharged on 11/26/2013. He presented apparently with progressive cough and was diagnosed with acute bronchitis. Influenza screen was negative. He was placed empirically on Levaquin though his chest x-ray did not show any pneumonia. He was given albuterol nebulizers and Mucinex. He is refused prednisone stating that he had blood sugar over 800 once with prednisone.  Diabetes was stable on sliding scale in hospital. He does not check sugars regularly. He has chronic atrial fibrillation with recent INR 1.5. Needs followup today.  Patient was also some complaining of some early satiety and mild weight loss with occasional dysphagia. Consult per GI. Upper GI series did not show any signs of mass or obstruction. His symptoms are stable this time. He continues to have some cough which is mostly nonproductive. No fever. He has complained of some Achilles pain with Levaquin and only has one more day of Levaquin to take. No orthopnea. No chest pains. No fevers or chills.  Past Medical History  Diagnosis Date  . Chronic atrial fibrillation     a. Historically difficult rate control. b. s/p CRT-D implantation with anticipation of AV junction ablation but patient then was able to accomplish rate control and ablation not undertaken.; AVN ablation 08/2013 by Dr Lovena Le  . CARDIOMYOPATHY, PRIMARY, DILATED     a. Mixed ICM/NICM - EF 15% by echo 05/2009; St. Jude CRT-D implanted 06/2009. b. Echo 04/2013 - EF 20%, mild LVH.  [name] COLON CANCER, HX OF     a. s/p partial colectomy.  . Chronic systolic CHF (congestive  heart failure)   . CORONARY ARTERY DISEASE     a. BMS to LAD 04/2008. b. Cath 06/2009: nonobstructive disease.  [name] PROSTATE CANCER, HX OF     a. s/p radical prostatectomy.  [name] SKIN CANCER, HX OF   . SLEEP APNEA   . Hypertension   . Hyperlipidemia   . Diabetes mellitus   . Osteoarthritis   . Tubular adenoma of colon   . Secondary hyperparathyroidism   . CKD (chronic kidney disease), stage IV     a. Right brachiocephalic AV fistula 01/8098. Neph = Dunham.  . Vertigo   . Valvular heart disease     a. Echo 04/2013: mild AI, mild MR.  . Pulmonary HTN     a. PA pressure 49mmHg by echo 05/01/13.   Past Surgical History  Procedure Laterality Date  . Colectomy    . Prostatectomy    . Penile prosthesis placement    . Removed prosthetic eye    . Ptca      stent placed  . Total knee arthroplasty  2008    right  . Pacemaker insertion  2010  . Av fistula placement  03/30/2012    Procedure: ARTERIOVENOUS (AV) FISTULA CREATION;  Surgeon: Angelia Mould, MD;  Location: [place];  Service: Vascular;  Laterality: Right;  Creation of BrachioCephalic Fistula Right arm  . Eye surgery      left eye prothesis  . Fracture surgery      collar bone, right knee replacement  . Bi-ventricular implantable cardioverter defibrillator  (crt-d)  2010    STJ CRTD  implanted by Dr [name]  . Ablation  08/2013    AVN ablation by Dr Lovena Le    reports that he quit smoking about 47 years ago. His smoking use included Cigarettes. He smoked 0.00 packs per day. He has never used smokeless tobacco. He reports that he drinks alcohol. He reports that he does not use illicit drugs. family history includes Heart disease in his father and mother; Hypertension in his mother; Throat cancer in his father and mother. Allergies  Allergen Reactions  . Ace Inhibitors     Stopped by Dr. Burt Knack due to progressive renal failure  . Prednisone Other (See Comments)    Raised blood sugar      Review of Systems  Constitutional:  Positive for fatigue. Negative for fever, activity change and appetite change.  Respiratory: Positive for cough and wheezing. Negative for shortness of breath.   Cardiovascular: Negative for chest pain and leg swelling.  Gastrointestinal: Negative for vomiting and abdominal pain.  Genitourinary: Negative for dysuria, hematuria and flank pain.  Musculoskeletal: Positive for back pain. Negative for joint swelling.  Neurological: Positive for weakness (generalized). Negative for numbness.  Psychiatric/Behavioral: Negative for confusion.       Objective:   Physical Exam  Constitutional: He appears well-developed and well-nourished.  HENT:  Right Ear: External ear normal.  Left Ear: External ear normal.  Mouth/Throat: Oropharynx is clear and moist.  Cardiovascular: Normal rate.   Pulmonary/Chest: Effort normal.  Patient some diffuse wheezes. No rales  Musculoskeletal: He exhibits no edema.  Neurological: He is alert.          Assessment & Plan:  Recent hospitalization for acute bronchitis. Chest x-ray did not show pneumonia. Patient is finishing up Levaquin and we have instructed to discontinue as he's having severe bilateral Achilles pain. He only had one day of medication left.. Does have some wheezing today but pulse oximetry 96% and no respiratory distress. He is very reluctant to consider steroids with previous blood sugar over 800 on prednisone.  Type 2 diabetes. Continue close monitoring. Hypertension which is stable Chronic atrial fibrillation. Rate controlled. Repeat INR

## 2013-11-29 NOTE — Patient Instructions (Signed)
1 tablet daily except 1/2 tablet on M/F.  Re-check in 10 DAYS.   Anticoagulation Dose Instructions as of 11/29/2013     [name] [name] Tue Wed Thu Fri Sat   New Dose 5 mg 2.5 mg 5 mg 5 mg 5 mg 2.5 mg 5 mg    Description        1 tablet daily except 1/2 tablet on M/F.  Re-check in 10 DAYS.

## 2013-11-29 NOTE — Telephone Encounter (Signed)
Relevant patient education assigned to patient using Emmi. ° °

## 2013-11-29 NOTE — Patient Instructions (Signed)
Stop the Levaquin at this time. Follow up promptly for any fever or increased shortness of breath.

## 2013-11-30 ENCOUNTER — Ambulatory Visit: Payer: Medicare Other | Admitting: Internal Medicine

## 2013-12-08 ENCOUNTER — Ambulatory Visit (INDEPENDENT_AMBULATORY_CARE_PROVIDER_SITE_OTHER): Payer: Medicare Other | Admitting: *Deleted

## 2013-12-08 ENCOUNTER — Ambulatory Visit (INDEPENDENT_AMBULATORY_CARE_PROVIDER_SITE_OTHER): Payer: Medicare Other | Admitting: General Practice

## 2013-12-08 ENCOUNTER — Encounter: Payer: Self-pay | Admitting: Internal Medicine

## 2013-12-08 DIAGNOSIS — I428 Other cardiomyopathies: Secondary | ICD-10-CM

## 2013-12-08 DIAGNOSIS — I509 Heart failure, unspecified: Secondary | ICD-10-CM

## 2013-12-08 DIAGNOSIS — I482 Chronic atrial fibrillation, unspecified: Secondary | ICD-10-CM

## 2013-12-08 DIAGNOSIS — I4891 Unspecified atrial fibrillation: Secondary | ICD-10-CM

## 2013-12-08 DIAGNOSIS — Z5181 Encounter for therapeutic drug level monitoring: Secondary | ICD-10-CM

## 2013-12-08 LAB — POCT INR: INR: 2.4

## 2013-12-08 NOTE — Progress Notes (Signed)
Pre visit review using our clinic review tool, if applicable. No additional management support is needed unless otherwise documented below in the visit note. 

## 2013-12-09 LAB — MDC_IDC_ENUM_SESS_TYPE_REMOTE
HighPow Impedance: 44 Ohm
Implantable Pulse Generator Serial Number: 595859
Lead Channel Impedance Value: 380 Ohm
Lead Channel Impedance Value: 380 Ohm
Lead Channel Pacing Threshold Pulse Width: 1 ms
Lead Channel Setting Pacing Amplitude: 2.5 V
Lead Channel Setting Pacing Pulse Width: 0.6 ms
MDC IDC MSMT BATTERY REMAINING LONGEVITY: 38 mo
MDC IDC MSMT LEADCHNL LV PACING THRESHOLD AMPLITUDE: 0.5 V
MDC IDC MSMT LEADCHNL RV SENSING INTR AMPL: 11.8 mV
MDC IDC SET LEADCHNL LV PACING AMPLITUDE: 2 V
MDC IDC SET LEADCHNL LV PACING PULSEWIDTH: 1 ms
MDC IDC SET LEADCHNL RV SENSING SENSITIVITY: 0.5 mV
Zone Setting Detection Interval: 250 ms
Zone Setting Detection Interval: 300 ms

## 2013-12-13 ENCOUNTER — Ambulatory Visit: Payer: Medicare Other | Admitting: Internal Medicine

## 2013-12-14 ENCOUNTER — Telehealth: Payer: Self-pay | Admitting: Internal Medicine

## 2013-12-14 NOTE — Telephone Encounter (Signed)
New message     Did we receive his remote transmission?  He has a wireless box but mychart says we did not receive it.

## 2013-12-15 NOTE — Telephone Encounter (Signed)
Patient informed that remote was received. 

## 2013-12-20 ENCOUNTER — Encounter: Payer: Self-pay | Admitting: Internal Medicine

## 2013-12-20 ENCOUNTER — Ambulatory Visit (INDEPENDENT_AMBULATORY_CARE_PROVIDER_SITE_OTHER): Payer: Medicare Other | Admitting: Internal Medicine

## 2013-12-20 VITALS — BP 136/84 | HR 72 | Temp 98.2°F | Ht 72.0 in | Wt 187.0 lb

## 2013-12-20 DIAGNOSIS — I2510 Atherosclerotic heart disease of native coronary artery without angina pectoris: Secondary | ICD-10-CM

## 2013-12-20 DIAGNOSIS — I509 Heart failure, unspecified: Secondary | ICD-10-CM

## 2013-12-20 DIAGNOSIS — E119 Type 2 diabetes mellitus without complications: Secondary | ICD-10-CM

## 2013-12-20 NOTE — Progress Notes (Signed)
Bronchitis/pneumonia- see hospital notes  Wife is dying from lung CA  CHF- he is eating and drinking ok  DM-  Lab Results  Component Value Date   HGBA1C 7.4* 09/10/2013    Past Medical History  Diagnosis Date  . Chronic atrial fibrillation     a. Historically difficult rate control. b. s/p CRT-D implantation with anticipation of AV junction ablation but patient then was able to accomplish rate control and ablation not undertaken.; AVN ablation 08/2013 by Dr Lovena Le  . CARDIOMYOPATHY, PRIMARY, DILATED     a. Mixed ICM/NICM - EF 15% by echo 05/2009; St. Jude CRT-D implanted 06/2009. b. Echo 04/2013 - EF 20%, mild LVH.  [name] COLON CANCER, HX OF     a. s/p partial colectomy.  . Chronic systolic CHF (congestive heart failure)   . CORONARY ARTERY DISEASE     a. BMS to LAD 04/2008. b. Cath 06/2009: nonobstructive disease.  [name] PROSTATE CANCER, HX OF     a. s/p radical prostatectomy.  [name] SKIN CANCER, HX OF   . SLEEP APNEA   . Hypertension   . Hyperlipidemia   . Diabetes mellitus   . Osteoarthritis   . Tubular adenoma of colon   . Secondary hyperparathyroidism   . CKD (chronic kidney disease), stage IV     a. Right brachiocephalic AV fistula 02/6439. Neph = Dunham.  . Vertigo   . Valvular heart disease     a. Echo 04/2013: mild AI, mild MR.  . Pulmonary HTN     a. PA pressure 75mmHg by echo 05/01/13.    History   Social History  . Marital Status: Married    Spouse Name: N/A    Number of Children: 4  . Years of Education: N/A   Occupational History  . retired    Social History Main Topics  . Smoking status: Former Smoker    Types: Cigarettes    Quit date: 09/22/1966  . Smokeless tobacco: Never Used  . Alcohol Use: 0 - .5 oz/week    0-1 drink(s) per week     Comment: occasional  . Drug Use: No  . Sexual Activity: No   Other Topics Concern  . Not on file   Social History Narrative  . No narrative on file    Past Surgical History  Procedure Laterality Date  . Colectomy     . Prostatectomy    . Penile prosthesis placement    . Removed prosthetic eye    . Ptca      stent placed  . Total knee arthroplasty  2008    right  . Pacemaker insertion  2010  . Av fistula placement  03/30/2012    Procedure: ARTERIOVENOUS (AV) FISTULA CREATION;  Surgeon: Angelia Mould, MD;  Location: [place];  Service: Vascular;  Laterality: Right;  Creation of BrachioCephalic Fistula Right arm  . Eye surgery      left eye prothesis  . Fracture surgery      collar bone, right knee replacement  . Bi-ventricular implantable cardioverter defibrillator  (crt-d)  2010    STJ CRTD implanted by Dr [name]  . Ablation  08/2013    AVN ablation by Dr Lovena Le    Family History  Problem Relation Age of Onset  . Heart disease Father   . Throat cancer Father   . Throat cancer Mother   . Heart disease Mother   . Hypertension Mother     Allergies  Allergen Reactions  . Ace Inhibitors  Stopped by Dr. Burt Knack due to progressive renal failure  . Prednisone Other (See Comments)    Raised blood sugar    Current Outpatient Prescriptions on File Prior to Visit  Medication Sig Dispense Refill  . aspirin 81 MG chewable tablet Chew 81 mg by mouth daily.      . calcitRIOL (ROCALTROL) 0.25 MCG capsule Take 1 capsule (0.25 mcg total) by mouth as directed. Taking only on Monday and Thursday      . digoxin (LANOXIN) 0.125 MG tablet Take 1 tablet (0.125 mg total) by mouth every other day.      . furosemide (LASIX) 80 MG tablet Take 1 tablet (80 mg total) by mouth 2 (two) times daily.  180 tablet  2  . glucose blood (FREESTYLE TEST STRIPS) test strip 1 each by Other route as needed. Use as instructed       . hydrALAZINE (APRESOLINE) 25 MG tablet Take 1.5 tablets (37.5 mg total) by mouth 3 (three) times daily.  180 tablet  6  . isosorbide mononitrate (IMDUR) 30 MG 24 hr tablet Take 0.5 tablets (15 mg total) by mouth daily.  90 tablet  3  . meclizine (ANTIVERT) 25 MG tablet Take 1 tablet (25 mg  total) by mouth 3 (three) times daily as needed for dizziness or nausea.  30 tablet  0  . metoprolol succinate (TOPROL-XL) 100 MG 24 hr tablet Take 100 mg by mouth 2 (two) times daily. Take with or immediately following a meal.      . nitroGLYCERIN (NITROSTAT) 0.4 MG SL tablet Place 0.4 mg under the tongue every 5 (five) minutes x 3 doses as needed for chest pain.      . potassium chloride SA (K-DUR,KLOR-CON) 20 MEQ tablet Take 20 mEq by mouth 2 (two) times daily.       [name] warfarin (COUMADIN) 5 MG tablet Take 2.5-5 mg by mouth daily. Monday and Friday take 2.5 mg; Tuesday, Wednesday, Thursday, Saturday, and Sunday takes 5 mg.       No current facility-administered medications on file prior to visit.     patient denies chest pain, shortness of breath, orthopnea. Denies lower extremity edema, abdominal pain, change in appetite, change in bowel movements. Patient denies rashes, musculoskeletal complaints. No other specific complaints in a complete review of systems.   BP 136/84  Pulse 72  Temp(Src) 98.2 F (36.8 C) (Oral)  Ht 6' (1.829 m)  Wt 187 lb (84.823 kg)  BMI 25.36 kg/m2  well-developed well-nourished male in no acute distress. HEENT exam atraumatic, normocephalic, neck supple without jugular venous distention. Chest clear to auscultation cardiac exam S1-S2 are regular. Abdominal exam overweight with bowel sounds, soft and nontender. Extremities no edema. Neurologic exam is alert with a normal gait.

## 2013-12-20 NOTE — Progress Notes (Signed)
Pre visit review using our clinic review tool, if applicable. No additional management support is needed unless otherwise documented below in the visit note. 

## 2013-12-20 NOTE — Assessment & Plan Note (Signed)
Will check labs in 2 months

## 2013-12-20 NOTE — Progress Notes (Signed)
ICD remote with ICM 

## 2013-12-21 ENCOUNTER — Encounter: Payer: Self-pay | Admitting: *Deleted

## 2013-12-23 NOTE — Assessment & Plan Note (Signed)
Clinically he is doing okay. No evidence of congestive heart failure. He was hospitalized with probable pneumonia. He is feeling much better. I don't think any further evaluation necessary at this time.

## 2013-12-25 DIAGNOSIS — I10 Essential (primary) hypertension: Secondary | ICD-10-CM

## 2013-12-25 DIAGNOSIS — E119 Type 2 diabetes mellitus without complications: Secondary | ICD-10-CM

## 2013-12-25 DIAGNOSIS — J209 Acute bronchitis, unspecified: Secondary | ICD-10-CM

## 2013-12-25 DIAGNOSIS — I4891 Unspecified atrial fibrillation: Secondary | ICD-10-CM

## 2013-12-25 DIAGNOSIS — E785 Hyperlipidemia, unspecified: Secondary | ICD-10-CM

## 2013-12-28 ENCOUNTER — Encounter: Payer: Self-pay | Admitting: Internal Medicine

## 2014-01-03 ENCOUNTER — Telehealth: Payer: Self-pay

## 2014-01-03 NOTE — Telephone Encounter (Signed)
Relevant patient education assigned to patient using Emmi. ° °

## 2014-01-05 ENCOUNTER — Ambulatory Visit (INDEPENDENT_AMBULATORY_CARE_PROVIDER_SITE_OTHER): Payer: Medicare Other | Admitting: General Practice

## 2014-01-05 DIAGNOSIS — I482 Chronic atrial fibrillation, unspecified: Secondary | ICD-10-CM

## 2014-01-05 DIAGNOSIS — Z5181 Encounter for therapeutic drug level monitoring: Secondary | ICD-10-CM

## 2014-01-05 DIAGNOSIS — I4891 Unspecified atrial fibrillation: Secondary | ICD-10-CM

## 2014-01-05 LAB — POCT INR: INR: 2.2

## 2014-01-05 NOTE — Progress Notes (Signed)
Pre visit review using our clinic review tool, if applicable. No additional management support is needed unless otherwise documented below in the visit note. 

## 2014-01-31 ENCOUNTER — Ambulatory Visit: Payer: Medicare Other | Admitting: Podiatry

## 2014-02-02 ENCOUNTER — Ambulatory Visit: Payer: Medicare Other

## 2014-02-15 ENCOUNTER — Encounter: Payer: Self-pay | Admitting: Podiatry

## 2014-02-15 ENCOUNTER — Ambulatory Visit (INDEPENDENT_AMBULATORY_CARE_PROVIDER_SITE_OTHER): Payer: Medicare Other | Admitting: Podiatry

## 2014-02-15 VITALS — BP 169/78 | HR 70 | Ht 72.0 in | Wt 187.0 lb

## 2014-02-15 DIAGNOSIS — M79609 Pain in unspecified limb: Secondary | ICD-10-CM

## 2014-02-15 DIAGNOSIS — M79606 Pain in leg, unspecified: Secondary | ICD-10-CM | POA: Insufficient documentation

## 2014-02-15 DIAGNOSIS — B351 Tinea unguium: Secondary | ICD-10-CM

## 2014-02-15 NOTE — Patient Instructions (Signed)
Seen for hypertrophic nails. All nails debrided. Return in 3 months or as needed.  

## 2014-02-15 NOTE — Progress Notes (Signed)
Subjective:  80 y.o. year old male patient presents requesting toe nails trimmed.   Objective: No new changes since the last visit. Dermatologic:  Thick dystrophic nails x 9 with missing 4th left.  Skin: No skin lesions.  Vascular:  Dorsalis pedis pulses palpable on both feet.  Posterior tibial pulses are not palpable on both feet.  No edema or erythema, no ischemic changes noted.  Orthopedic:  Cavus type foot.  Neurologic:  Failed respond to Monofilament sensory testing.  Vibratory and Ankle DTR is within normal bilateral.  Assessment:  Dystrophic mycotic nails x 9.  Diabetic neuropathy.  Treatment: All mycotic nails and ingrown nails debrided.  Return in 3 months or as needed

## 2014-02-16 ENCOUNTER — Other Ambulatory Visit (INDEPENDENT_AMBULATORY_CARE_PROVIDER_SITE_OTHER): Payer: Medicare Other

## 2014-02-16 ENCOUNTER — Encounter (HOSPITAL_COMMUNITY): Payer: Self-pay | Admitting: Emergency Medicine

## 2014-02-16 ENCOUNTER — Emergency Department (HOSPITAL_COMMUNITY): Payer: Medicare Other

## 2014-02-16 ENCOUNTER — Ambulatory Visit: Payer: Medicare Other

## 2014-02-16 ENCOUNTER — Inpatient Hospital Stay (HOSPITAL_COMMUNITY)
Admission: EM | Admit: 2014-02-16 | Discharge: 2014-02-19 | DRG: 194 | Disposition: A | Discharge status: Home / Self Care | Payer: Medicare Other | Attending: Internal Medicine | Admitting: Internal Medicine

## 2014-02-16 ENCOUNTER — Ambulatory Visit (INDEPENDENT_AMBULATORY_CARE_PROVIDER_SITE_OTHER): Payer: Medicare Other | Admitting: Family

## 2014-02-16 DIAGNOSIS — Z8546 Personal history of malignant neoplasm of prostate: Secondary | ICD-10-CM

## 2014-02-16 DIAGNOSIS — E1165 Type 2 diabetes mellitus with hyperglycemia: Secondary | ICD-10-CM | POA: Diagnosis present

## 2014-02-16 DIAGNOSIS — I420 Dilated cardiomyopathy: Secondary | ICD-10-CM | POA: Diagnosis present

## 2014-02-16 DIAGNOSIS — E785 Hyperlipidemia, unspecified: Secondary | ICD-10-CM | POA: Diagnosis present

## 2014-02-16 DIAGNOSIS — I428 Other cardiomyopathies: Secondary | ICD-10-CM

## 2014-02-16 DIAGNOSIS — N2581 Secondary hyperparathyroidism of renal origin: Secondary | ICD-10-CM | POA: Diagnosis present

## 2014-02-16 DIAGNOSIS — Z96659 Presence of unspecified artificial knee joint: Secondary | ICD-10-CM

## 2014-02-16 DIAGNOSIS — N058 Unspecified nephritic syndrome with other morphologic changes: Secondary | ICD-10-CM | POA: Diagnosis present

## 2014-02-16 DIAGNOSIS — R197 Diarrhea, unspecified: Secondary | ICD-10-CM | POA: Diagnosis present

## 2014-02-16 DIAGNOSIS — I482 Chronic atrial fibrillation, unspecified: Secondary | ICD-10-CM

## 2014-02-16 DIAGNOSIS — I5022 Chronic systolic (congestive) heart failure: Secondary | ICD-10-CM | POA: Diagnosis present

## 2014-02-16 DIAGNOSIS — I2510 Atherosclerotic heart disease of native coronary artery without angina pectoris: Secondary | ICD-10-CM | POA: Diagnosis present

## 2014-02-16 DIAGNOSIS — R112 Nausea with vomiting, unspecified: Secondary | ICD-10-CM

## 2014-02-16 DIAGNOSIS — Z9049 Acquired absence of other specified parts of digestive tract: Secondary | ICD-10-CM

## 2014-02-16 DIAGNOSIS — I509 Heart failure, unspecified: Secondary | ICD-10-CM | POA: Diagnosis present

## 2014-02-16 DIAGNOSIS — Z7901 Long term (current) use of anticoagulants: Secondary | ICD-10-CM

## 2014-02-16 DIAGNOSIS — IMO0002 Reserved for concepts with insufficient information to code with codable children: Secondary | ICD-10-CM | POA: Diagnosis present

## 2014-02-16 DIAGNOSIS — Z5181 Encounter for therapeutic drug level monitoring: Secondary | ICD-10-CM

## 2014-02-16 DIAGNOSIS — I4891 Unspecified atrial fibrillation: Secondary | ICD-10-CM

## 2014-02-16 DIAGNOSIS — Z794 Long term (current) use of insulin: Secondary | ICD-10-CM

## 2014-02-16 DIAGNOSIS — R1110 Vomiting, unspecified: Secondary | ICD-10-CM

## 2014-02-16 DIAGNOSIS — Z7982 Long term (current) use of aspirin: Secondary | ICD-10-CM

## 2014-02-16 DIAGNOSIS — Z4502 Encounter for adjustment and management of automatic implantable cardiac defibrillator: Secondary | ICD-10-CM

## 2014-02-16 DIAGNOSIS — N184 Chronic kidney disease, stage 4 (severe): Secondary | ICD-10-CM

## 2014-02-16 DIAGNOSIS — Z8249 Family history of ischemic heart disease and other diseases of the circulatory system: Secondary | ICD-10-CM

## 2014-02-16 DIAGNOSIS — I129 Hypertensive chronic kidney disease with stage 1 through stage 4 chronic kidney disease, or unspecified chronic kidney disease: Secondary | ICD-10-CM | POA: Diagnosis present

## 2014-02-16 DIAGNOSIS — Z95810 Presence of automatic (implantable) cardiac defibrillator: Secondary | ICD-10-CM

## 2014-02-16 DIAGNOSIS — E119 Type 2 diabetes mellitus without complications: Secondary | ICD-10-CM

## 2014-02-16 DIAGNOSIS — J189 Pneumonia, unspecified organism: Principal | ICD-10-CM

## 2014-02-16 DIAGNOSIS — I10 Essential (primary) hypertension: Secondary | ICD-10-CM

## 2014-02-16 DIAGNOSIS — E1129 Type 2 diabetes mellitus with other diabetic kidney complication: Secondary | ICD-10-CM | POA: Diagnosis present

## 2014-02-16 DIAGNOSIS — Z87891 Personal history of nicotine dependence: Secondary | ICD-10-CM

## 2014-02-16 DIAGNOSIS — Z85038 Personal history of other malignant neoplasm of large intestine: Secondary | ICD-10-CM

## 2014-02-16 DIAGNOSIS — I2589 Other forms of chronic ischemic heart disease: Secondary | ICD-10-CM | POA: Diagnosis present

## 2014-02-16 LAB — URINALYSIS, ROUTINE W REFLEX MICROSCOPIC
Bilirubin Urine: NEGATIVE
Glucose, UA: 100 mg/dL — AB
HGB URINE DIPSTICK: NEGATIVE
Ketones, ur: NEGATIVE mg/dL
Leukocytes, UA: NEGATIVE
Nitrite: NEGATIVE
PH: 5.5 (ref 5.0–8.0)
Protein, ur: 100 mg/dL — AB
SPECIFIC GRAVITY, URINE: 1.017 (ref 1.005–1.030)
UROBILINOGEN UA: 0.2 mg/dL (ref 0.0–1.0)

## 2014-02-16 LAB — MICROALBUMIN / CREATININE URINE RATIO
Creatinine,U: 109.5 mg/dL
Microalb Creat Ratio: 65.6 mg/g — ABNORMAL HIGH (ref 0.0–30.0)
Microalb, Ur: 71.8 mg/dL — ABNORMAL HIGH (ref 0.0–1.9)

## 2014-02-16 LAB — BASIC METABOLIC PANEL
BUN: 54 mg/dL — ABNORMAL HIGH (ref 6–23)
BUN: 59 mg/dL — ABNORMAL HIGH (ref 6–23)
CALCIUM: 9.2 mg/dL (ref 8.4–10.5)
CHLORIDE: 100 meq/L (ref 96–112)
CO2: 29 meq/L (ref 19–32)
CO2: 22 meq/L (ref 19–32)
Calcium: 9.4 mg/dL (ref 8.4–10.5)
Chloride: 101 mEq/L (ref 96–112)
Creatinine, Ser: 3.5 mg/dL — ABNORMAL HIGH (ref 0.4–1.5)
Creatinine, Ser: 3.26 mg/dL — ABNORMAL HIGH (ref 0.50–1.35)
GFR calc Af Amer: 19 mL/min — ABNORMAL LOW (ref >90)
GFR calc non Af Amer: 17 mL/min — ABNORMAL LOW (ref >90)
GFR: 18.26 mL/min — AB (ref >60.00)
GLUCOSE: 159 mg/dL — AB (ref 70–99)
GLUCOSE: 245 mg/dL — AB (ref 70–99)
POTASSIUM: 3.7 meq/L (ref 3.7–5.3)
Potassium: 3.5 mEq/L (ref 3.5–5.1)
SODIUM: 140 meq/L (ref 137–147)
Sodium: 140 mEq/L (ref 135–145)

## 2014-02-16 LAB — CBC
HCT: 42.1 % (ref 39.0–52.0)
HEMOGLOBIN: 14.2 g/dL (ref 13.0–17.0)
MCH: 31 pg (ref 26.0–34.0)
MCHC: 33.7 g/dL (ref 30.0–36.0)
MCV: 91.9 fL (ref 78.0–100.0)
Platelets: 121 10*3/uL — ABNORMAL LOW (ref 150–400)
RBC: 4.58 MIL/uL (ref 4.22–5.81)
RDW: 14.5 % (ref 11.5–15.5)
WBC: 8.6 10*3/uL (ref 4.0–10.5)

## 2014-02-16 LAB — HEPATIC FUNCTION PANEL
ALT: 15 U/L (ref 0–53)
AST: 17 U/L (ref 0–37)
Albumin: 3.7 g/dL (ref 3.5–5.2)
Alkaline Phosphatase: 36 U/L — ABNORMAL LOW (ref 39–117)
Bilirubin, Direct: 0.2 mg/dL (ref 0.0–0.3)
TOTAL PROTEIN: 6.7 g/dL (ref 6.0–8.3)
Total Bilirubin: 1.1 mg/dL (ref 0.2–1.2)

## 2014-02-16 LAB — I-STAT TROPONIN, ED: TROPONIN I, POC: 0.06 ng/mL (ref 0.00–0.08)

## 2014-02-16 LAB — HEMOGLOBIN A1C: Hgb A1c MFr Bld: 7.9 % — ABNORMAL HIGH (ref 4.6–6.5)

## 2014-02-16 LAB — PROTIME-INR
INR: 2.14 — AB (ref 0.00–1.49)
Prothrombin Time: 23.2 seconds — ABNORMAL HIGH (ref 11.6–15.2)

## 2014-02-16 LAB — LIPID PANEL
Cholesterol: 165 mg/dL (ref 0–200)
HDL: 31.4 mg/dL — ABNORMAL LOW (ref >39.00)
LDL Cholesterol: 108 mg/dL — ABNORMAL HIGH (ref 0–99)
TRIGLYCERIDES: 128 mg/dL (ref 0.0–149.0)
Total CHOL/HDL Ratio: 5
VLDL: 25.6 mg/dL (ref 0.0–40.0)

## 2014-02-16 LAB — POCT INR: INR: 1.9

## 2014-02-16 LAB — URINE MICROSCOPIC-ADD ON

## 2014-02-16 LAB — DIGOXIN LEVEL: Digoxin Level: 0.3 ng/mL — ABNORMAL LOW (ref 0.8–2.0)

## 2014-02-16 LAB — LIPASE, BLOOD: LIPASE: 115 U/L — AB (ref 11–59)

## 2014-02-16 LAB — PRO B NATRIURETIC PEPTIDE: Pro B Natriuretic peptide (BNP): 8984 pg/mL — ABNORMAL HIGH (ref 0–450)

## 2014-02-16 MED ORDER — VANCOMYCIN HCL 10 G IV SOLR
1250.0000 mg | Freq: Once | INTRAVENOUS | Status: DC
  Administered 2014-02-17: 1250 mg via INTRAVENOUS
  Filled 2014-02-16: qty 1250

## 2014-02-16 MED ORDER — ACETAMINOPHEN 325 MG PO TABS
650.0000 mg | ORAL_TABLET | Freq: Once | ORAL | Status: AC
  Administered 2014-02-16: 650 mg via ORAL
  Filled 2014-02-16: qty 2

## 2014-02-16 MED ORDER — DEXTROSE 5 % IV SOLN
2.0000 g | Freq: Two times a day (BID) | INTRAVENOUS | Status: DC
  Administered 2014-02-16: 2 g via INTRAVENOUS

## 2014-02-16 MED ORDER — ONDANSETRON HCL 4 MG/2ML IJ SOLN
4.0000 mg | Freq: Once | INTRAMUSCULAR | Status: AC
  Administered 2014-02-16: 4 mg via INTRAVENOUS
  Filled 2014-02-16: qty 2

## 2014-02-16 MED ORDER — SODIUM CHLORIDE 0.9 % IV BOLUS (SEPSIS)
500.0000 mL | Freq: Once | INTRAVENOUS | Status: AC
  Administered 2014-02-16: 500 mL via INTRAVENOUS

## 2014-02-16 NOTE — H&P (Signed)
PCP:   Chancy Hurter, MD   Chief Complaint:  Cough, chills x 2 days  HPI: 80 yo male comes in with 2 days of worsening cough, had chills all day today and was not feeling well.  He then vomited once and had one episode of diarrhea, both nonbloody.  Subjective fevers.  Has pleuritic chest pain on the lower left side when coughing a lot.  No swelling in his legs.  No abd pain.    Review of Systems:  Positive and negative as per HPI otherwise all other systems are negative  Past Medical History: Past Medical History  Diagnosis Date  . Chronic atrial fibrillation     a. Historically difficult rate control. b. s/p CRT-D implantation with anticipation of AV junction ablation but patient then was able to accomplish rate control and ablation not undertaken.; AVN ablation 08/2013 by Dr Lovena Le  . CARDIOMYOPATHY, PRIMARY, DILATED     a. Mixed ICM/NICM - EF 15% by echo 05/2009; St. Jude CRT-D implanted 06/2009. b. Echo 04/2013 - EF 20%, mild LVH.  [name] COLON CANCER, HX OF     a. s/p partial colectomy.  . Chronic systolic CHF (congestive heart failure)   . CORONARY ARTERY DISEASE     a. BMS to LAD 04/2008. b. Cath 06/2009: nonobstructive disease.  [name] PROSTATE CANCER, HX OF     a. s/p radical prostatectomy.  [name] SKIN CANCER, HX OF   . SLEEP APNEA   . Hypertension   . Hyperlipidemia   . Diabetes mellitus   . Osteoarthritis   . Tubular adenoma of colon   . Secondary hyperparathyroidism   . CKD (chronic kidney disease), stage IV     a. Right brachiocephalic AV fistula 03/6282. Neph = Dunham.  . Vertigo   . Valvular heart disease     a. Echo 04/2013: mild AI, mild MR.  . Pulmonary HTN     a. PA pressure 68mmHg by echo 05/01/13.   Past Surgical History  Procedure Laterality Date  . Colectomy    . Prostatectomy    . Penile prosthesis placement    . Removed prosthetic eye    . Ptca      stent placed  . Total knee arthroplasty  2008    right  . Pacemaker insertion  2010  . Av fistula  placement  03/30/2012    Procedure: ARTERIOVENOUS (AV) FISTULA CREATION;  Surgeon: Angelia Mould, MD;  Location: [facility];  Service: Vascular;  Laterality: Right;  Creation of BrachioCephalic Fistula Right arm  . Eye surgery      left eye prothesis  . Fracture surgery      collar bone, right knee replacement  . Bi-ventricular implantable cardioverter defibrillator  (crt-d)  2010    STJ CRTD implanted by Dr [name]  . Ablation  08/2013    AVN ablation by Dr Lovena Le    Medications: Prior to Admission medications   Medication Sig Start Date End Date Taking? Authorizing Provider  aspirin 81 MG chewable tablet Chew 81 mg by mouth daily.   Yes Historical Provider, MD  calcitRIOL (ROCALTROL) 0.25 MCG capsule Take 1 capsule (0.25 mcg total) by mouth as directed. Taking only on Monday and Thursday 10/10/13  Yes Burtis Junes, NP  digoxin (LANOXIN) 0.125 MG tablet Take 1 tablet (0.125 mg total) by mouth every other day. 09/13/13  Yes Barton Dubois, MD  furosemide (LASIX) 80 MG tablet Take 1 tablet (80 mg total) by mouth 2 (two) times daily. 10/25/13  Yes Sherren Mocha, MD  hydrALAZINE (APRESOLINE) 25 MG tablet Take 1.5 tablets (37.5 mg total) by mouth 3 (three) times daily. 10/10/13  Yes Burtis Junes, NP  insulin lispro protamine-lispro (HUMALOG 75/25 MIX) (75-25) 100 UNIT/ML SUSP injection Inject 45 Units into the skin 2 (two) times daily with a meal. 11/26/13  Yes Velvet Bathe, MD  isosorbide mononitrate (IMDUR) 30 MG 24 hr tablet Take 0.5 tablets (15 mg total) by mouth daily. 01/31/13  Yes Lisabeth Pick, MD  metoprolol succinate (TOPROL-XL) 100 MG 24 hr tablet Take 100 mg by mouth 2 (two) times daily. Take with or immediately following a meal.   Yes Historical Provider, MD  potassium chloride SA (K-DUR,KLOR-CON) 20 MEQ tablet Take 20 mEq by mouth 2 (two) times daily.    Yes Historical Provider, MD  warfarin (COUMADIN) 5 MG tablet Take 2.5-5 mg by mouth See admin instructions. On Monday and Friday  take 2.5 mg; On Tuesday, Wednesday, Thursday, Saturday, and Sunday takes 5 mg.   Yes Historical Provider, MD  glucose blood (FREESTYLE TEST STRIPS) test strip 1 each by Other route as needed. Use as instructed     Historical Provider, MD  nitroGLYCERIN (NITROSTAT) 0.4 MG SL tablet Place 0.4 mg under the tongue every 5 (five) minutes x 3 doses as needed for chest pain.    Historical Provider, MD    Allergies:   Allergies  Allergen Reactions  . Ace Inhibitors     Stopped by Dr. Burt Knack due to progressive renal failure  . Prednisone Other (See Comments)    Raised blood sugar    Social History:  reports that he quit smoking about 47 years ago. His smoking use included Cigarettes. He smoked 0.00 packs per day. He has never used smokeless tobacco. He reports that he drinks alcohol. He reports that he does not use illicit drugs.  Family History: Family History  Problem Relation Age of Onset  . Heart disease Father   . Throat cancer Father   . Throat cancer Mother   . Heart disease Mother   . Hypertension Mother     Physical Exam: Filed Vitals:   02/16/14 2130 02/16/14 2150 02/16/14 2213 02/16/14 2230  BP: 157/60 142/52  151/64  Pulse: 70   70  Temp:   102.8 F (39.3 C)   TempSrc:   Rectal   Resp: 30 23  22   Height:      Weight:      SpO2: 90% 91%  93%   General appearance: alert, cooperative and no distress Head: Normocephalic, without obvious abnormality, atraumatic Eyes: negative Nose: Nares normal. Septum midline. Mucosa normal. No drainage or sinus tenderness. Neck: no JVD and supple, symmetrical, trachea midline Lungs: clear to auscultation bilaterally Heart: regular rate and rhythm, S1, S2 normal, no murmur, click, rub or gallop Abdomen: soft, non-tender; bowel sounds normal; no masses,  no organomegaly Extremities: extremities normal, atraumatic, no cyanosis or edema Pulses: 2+ and symmetric Skin: Skin color, texture, turgor normal. No rashes or lesions Neurologic:  Grossly normal   Labs on Admission:   Recent Labs  02/16/14 0829 02/16/14 2122  NA 140 140  K 3.5 3.7  CL 101 100  CO2 29 22  GLUCOSE 159* 245*  BUN 54* 59*  CREATININE 3.5* 3.26*  CALCIUM 9.2 9.4    Recent Labs  02/16/14 0829  AST 17  ALT 15  ALKPHOS 36*  BILITOT 1.1  PROT 6.7  ALBUMIN 3.7    Recent Labs  02/16/14  2122  LIPASE 115*    Recent Labs  02/16/14 2122  WBC 8.6  HGB 14.2  HCT 42.1  MCV 91.9  PLT 121*   Radiological Exams on Admission: Dg Chest Port 1 View  02/16/2014   CLINICAL DATA:  Weakness, diarrhea  EXAM: PORTABLE CHEST - 1 VIEW  COMPARISON:  Prior chest x-ray 11/22/2013  FINDINGS: Stable position of left biventricular cardiac rhythm maintenance device. Leads project over the right ventricle and within a cardiac vein overlying the left ventricle. Stable cardiomegaly. Atherosclerotic calcification present in the transverse aorta. Nonspecific patchy interstitial airspace opacity in the left mid lung. Otherwise, the lungs are clear. No pleural effusion or pneumothorax. No acute osseous abnormality.  IMPRESSION: 1. New patchy interstitial opacity in the left mid lung concerning for early bronchopneumonia. 2. Stable positioning of left subclavian approach biventricular cardiac rhythm maintenance device.   Electronically Signed   By: Jacqulynn Cadet M.D.   On: 02/16/2014 22:10    Assessment/Plan  80 yo male with fever, cough, chills and HCAP  Principal Problem:   PNA (pneumonia)-  Was hosp for about 5 days in last couple of months, for this reason will cover with broad spectrum abx in the form of vancomycin and cefepime renally dosed per pharm.  Place on pna pathway, vss.  Admit to tele bed overnight. Full code.  Active Problems:  Stable unless o/w noted   DIABETES MELLITUS, TYPE II-  Place on lantus (takes 45 u bid of 75/25) 20 units and ssi   HYPERLIPIDEMIA   HYPERTENSION   CORONARY ARTERY DISEASE   CARDIOMYOPATHY, PRIMARY, DILATED   Chronic  atrial fibrillation-  Dig level pending   BIVentricular Defibrillator--St Jude   Chronic renal insufficiency, stage IV (severe)  At baseline.  Cont lasix for now.  No ivf.  bp good.   Encounter for therapeutic drug monitoring   Nausea & vomiting   Diarrhea    [name] [name] 02/16/2014, 11:35 PM

## 2014-02-16 NOTE — ED Provider Notes (Signed)
CSN: [id number]     Arrival date & time 02/16/14  2052 History   First MD Initiated Contact with Patient 02/16/14 2152     Chief Complaint  Patient presents with  . Weakness  . Emesis  . Diarrhea     (Consider location/radiation/quality/duration/timing/severity/associated sxs/prior Treatment) Patient is a 80 y.o. male presenting with weakness, vomiting, and diarrhea. The history is provided by the patient.  Weakness This is a new problem. The current episode started 2 days ago. The problem occurs constantly. The problem has not changed since onset.Associated symptoms include chest pain and shortness of breath. Pertinent negatives include no abdominal pain and no headaches. Nothing aggravates the symptoms. Nothing relieves the symptoms. He has tried nothing for the symptoms. The treatment provided no relief.  Emesis Severity:  Mild Timing:  Intermittent Number of daily episodes:  2 Quality:  Stomach contents Progression:  Unchanged Chronicity:  New Recent urination:  Normal Relieved by:  Nothing Worsened by:  Nothing tried Ineffective treatments:  None tried Associated symptoms: diarrhea   Associated symptoms: no abdominal pain and no headaches   Diarrhea Associated symptoms: vomiting   Associated symptoms: no abdominal pain, no fever and no headaches     Past Medical History  Diagnosis Date  . Chronic atrial fibrillation     a. Historically difficult rate control. b. s/p CRT-D implantation with anticipation of AV junction ablation but patient then was able to accomplish rate control and ablation not undertaken.; AVN ablation 08/2013 by Dr Lovena Le  . CARDIOMYOPATHY, PRIMARY, DILATED     a. Mixed ICM/NICM - EF 15% by echo 05/2009; St. Jude CRT-D implanted 06/2009. b. Echo 04/2013 - EF 20%, mild LVH.  [name] COLON CANCER, HX OF     a. s/p partial colectomy.  . Chronic systolic CHF (congestive heart failure)   . CORONARY ARTERY DISEASE     a. BMS to LAD 04/2008. b. Cath 06/2009:  nonobstructive disease.  [name] PROSTATE CANCER, HX OF     a. s/p radical prostatectomy.  [name] SKIN CANCER, HX OF   . SLEEP APNEA   . Hypertension   . Hyperlipidemia   . Diabetes mellitus   . Osteoarthritis   . Tubular adenoma of colon   . Secondary hyperparathyroidism   . CKD (chronic kidney disease), stage IV     a. Right brachiocephalic AV fistula [id number]. Neph = Dunham.  . Vertigo   . Valvular heart disease     a. Echo 04/2013: mild AI, mild MR.  . Pulmonary HTN     a. PA pressure 21mmHg by echo 05/01/13.   Past Surgical History  Procedure Laterality Date  . Colectomy    . Prostatectomy    . Penile prosthesis placement    . Removed prosthetic eye    . Ptca      stent placed  . Total knee arthroplasty  2008    right  . Pacemaker insertion  2010  . Av fistula placement  03/30/2012    Procedure: ARTERIOVENOUS (AV) FISTULA CREATION;  Surgeon: Angelia Mould, MD;  Location: [facility];  Service: Vascular;  Laterality: Right;  Creation of BrachioCephalic Fistula Right arm  . Eye surgery      left eye prothesis  . Fracture surgery      collar bone, right knee replacement  . Bi-ventricular implantable cardioverter defibrillator  (crt-d)  2010    STJ CRTD implanted by Dr [name]  . Ablation  08/2013    AVN ablation by Dr Lovena Le  Family History  Problem Relation Age of Onset  . Heart disease Father   . Throat cancer Father   . Throat cancer Mother   . Heart disease Mother   . Hypertension Mother    History  Substance Use Topics  . Smoking status: Former Smoker    Types: Cigarettes    Quit date: 09/22/1966  . Smokeless tobacco: Never Used  . Alcohol Use: 0.0 - 0.5 oz/week    0-1 drink(s) per week     Comment: occasional    Review of Systems  Constitutional: Negative for fever.  HENT: Negative for drooling and rhinorrhea.   Eyes: Negative for pain.  Respiratory: Positive for cough and shortness of breath.   Cardiovascular: Positive for chest pain. Negative for leg  swelling.  Gastrointestinal: Positive for nausea, vomiting and diarrhea. Negative for abdominal pain.  Genitourinary: Negative for dysuria and hematuria.  Musculoskeletal: Negative for gait problem and neck pain.  Skin: Negative for color change.  Neurological: Positive for weakness (generalized). Negative for numbness and headaches.  Hematological: Negative for adenopathy.  Psychiatric/Behavioral: Negative for behavioral problems.  All other systems reviewed and are negative.     Allergies  Ace inhibitors and Prednisone  Home Medications   Prior to Admission medications   Medication Sig Start Date End Date Taking? Authorizing Provider  aspirin 81 MG chewable tablet Chew 81 mg by mouth daily.   Yes Historical Provider, MD  calcitRIOL (ROCALTROL) 0.25 MCG capsule Take 1 capsule (0.25 mcg total) by mouth as directed. Taking only on Monday and Thursday 10/10/13  Yes Burtis Junes, NP  digoxin (LANOXIN) 0.125 MG tablet Take 1 tablet (0.125 mg total) by mouth every other day. 09/13/13  Yes Barton Dubois, MD  furosemide (LASIX) 80 MG tablet Take 1 tablet (80 mg total) by mouth 2 (two) times daily. 10/25/13  Yes Sherren Mocha, MD  hydrALAZINE (APRESOLINE) 25 MG tablet Take 1.5 tablets (37.5 mg total) by mouth 3 (three) times daily. 10/10/13  Yes Burtis Junes, NP  insulin lispro protamine-lispro (HUMALOG 75/25 MIX) (75-25) 100 UNIT/ML SUSP injection Inject 45 Units into the skin 2 (two) times daily with a meal. 11/26/13  Yes Velvet Bathe, MD  isosorbide mononitrate (IMDUR) 30 MG 24 hr tablet Take 0.5 tablets (15 mg total) by mouth daily. 01/31/13  Yes Lisabeth Pick, MD  metoprolol succinate (TOPROL-XL) 100 MG 24 hr tablet Take 100 mg by mouth 2 (two) times daily. Take with or immediately following a meal.   Yes Historical Provider, MD  potassium chloride SA (K-DUR,KLOR-CON) 20 MEQ tablet Take 20 mEq by mouth 2 (two) times daily.    Yes Historical Provider, MD  warfarin (COUMADIN) 5 MG tablet  Take 2.5-5 mg by mouth See admin instructions. On Monday and Friday take 2.5 mg; On Tuesday, Wednesday, Thursday, Saturday, and Sunday takes 5 mg.   Yes Historical Provider, MD  glucose blood (FREESTYLE TEST STRIPS) test strip 1 each by Other route as needed. Use as instructed     Historical Provider, MD  nitroGLYCERIN (NITROSTAT) 0.4 MG SL tablet Place 0.4 mg under the tongue every 5 (five) minutes x 3 doses as needed for chest pain.    Historical Provider, MD   BP 142/52  Pulse 70  Temp(Src) 102.8 F (39.3 C) (Rectal)  Resp 23  Ht 6' (1.829 m)  Wt 185 lb (83.915 kg)  BMI 25.08 kg/m2  SpO2 91% Physical Exam  Nursing note and vitals reviewed. Constitutional: He is oriented to person,  place, and time. He appears well-developed and well-nourished.  HENT:  Head: Normocephalic and atraumatic.  Right Ear: External ear normal.  Left Ear: External ear normal.  Nose: Nose normal.  Mouth/Throat: Oropharynx is clear and moist. No oropharyngeal exudate.  Eyes: Conjunctivae and EOM are normal. Pupils are equal, round, and reactive to light.  Neck: Normal range of motion. Neck supple.  Cardiovascular: Normal rate, regular rhythm, normal heart sounds and intact distal pulses.  Exam reveals no gallop and no friction rub.   No murmur heard. Pulmonary/Chest: Effort normal and breath sounds normal. No respiratory distress. He has no wheezes.  Abdominal: Soft. Bowel sounds are normal. He exhibits no distension. There is no tenderness. There is no rebound and no guarding.  Musculoskeletal: Normal range of motion. He exhibits no edema and no tenderness.  Neurological: He is alert and oriented to person, place, and time. He has normal strength. No sensory deficit.  Skin: Skin is warm and dry.  Psychiatric: He has a normal mood and affect. His behavior is normal.    ED Course  Procedures (including critical care time) Labs Review Labs Reviewed  CBC - Abnormal; Notable for the following:    Platelets  121 (*)    All other components within normal limits  BASIC METABOLIC PANEL - Abnormal; Notable for the following:    Glucose, Bld 245 (*)    BUN 59 (*)    Creatinine, Ser 3.26 (*)    GFR calc non Af Amer 17 (*)    GFR calc Af Amer 19 (*)    All other components within normal limits  PROTIME-INR - Abnormal; Notable for the following:    Prothrombin Time 23.2 (*)    INR 2.14 (*)    All other components within normal limits  LIPASE, BLOOD - Abnormal; Notable for the following:    Lipase 115 (*)    All other components within normal limits  PRO B NATRIURETIC PEPTIDE  DIGOXIN LEVEL  URINALYSIS, ROUTINE W REFLEX MICROSCOPIC  I-STAT TROPOININ, ED    Imaging Review Dg Chest Port 1 View  02/16/2014   CLINICAL DATA:  Weakness, diarrhea  EXAM: PORTABLE CHEST - 1 VIEW  COMPARISON:  Prior chest x-ray 11/22/2013  FINDINGS: Stable position of left biventricular cardiac rhythm maintenance device. Leads project over the right ventricle and within a cardiac vein overlying the left ventricle. Stable cardiomegaly. Atherosclerotic calcification present in the transverse aorta. Nonspecific patchy interstitial airspace opacity in the left mid lung. Otherwise, the lungs are clear. No pleural effusion or pneumothorax. No acute osseous abnormality.  IMPRESSION: 1. New patchy interstitial opacity in the left mid lung concerning for early bronchopneumonia. 2. Stable positioning of left subclavian approach biventricular cardiac rhythm maintenance device.   Electronically Signed   By: Jacqulynn Cadet M.D.   On: 02/16/2014 22:10     EKG Interpretation   Date/Time:  Thursday Feb 16 2014 20:59:47 EDT Ventricular Rate:  70 PR Interval:  171 QRS Duration: 173 QT Interval:  466 QTC Calculation: 503 R Axis:   -128 Text Interpretation:  Sinus rhythm Right bundle branch block No  significant change since last tracing Confirmed by Rosaly Labarbera  MD, Lexi Conaty  ([id number]) on 02/17/2014 12:08:28 AM      MDM   Final  diagnoses:  Healthcare-associated pneumonia  Vomiting  Diarrhea    10:18 PM 80 y.o. male w hx of afib on coumadin/dig, pulm htn, CKD who presents with a cough, vomiting, and diarrhea. He states that he developed a  cough 2 days ago and has had 2 episodes of diarrhea and vomiting today and were nonbloody in appearance. He denies any abdominal pain. He states that he has had some intermittent fleeting sharp chest pains over his left chest but denies this on exam now. He is found to be febrile and his rectal temperature. His vital signs are otherwise unremarkable. Will get screening labwork and imaging.  Found to have PNA. Likely HCAP as he had admission for approx 5 days at the beginning of march '15. Will cover w/ Vanc/Cefepime. Will admit to hospitalist.     Blanchard Kelch, MD 02/17/14 1009

## 2014-02-16 NOTE — ED Notes (Addendum)
Pt arrives via EMS from home. C/o waking up and not feeling well. Reports diarrhea and emesis, feels weak. 99.9 tympanic 160/76 CBG 246. Pt has pacer with defib and is cont paced. Hx CHF, DM. Pt also c/o left sided chest pain states it feels like heart burn.

## 2014-02-16 NOTE — Patient Instructions (Signed)
Take an extra 1/2 tab today only then Continue 1 tablet daily except 1/2 tablet on M/F.  Re-check in 4 weeks.   Anticoagulation Dose Instructions as of 02/16/2014     Dorene Grebe Tue Wed Thu Fri Sat   New Dose 5 mg 2.5 mg 5 mg 5 mg 5 mg 2.5 mg 5 mg    Description        Take an extra 1/2 tab today only then Continue 1 tablet daily except 1/2 tablet on M/F.  Re-check in 4 weeks.

## 2014-02-17 DIAGNOSIS — E1129 Type 2 diabetes mellitus with other diabetic kidney complication: Secondary | ICD-10-CM

## 2014-02-17 LAB — BASIC METABOLIC PANEL
BUN: 57 mg/dL — ABNORMAL HIGH (ref 6–23)
CHLORIDE: 101 meq/L (ref 96–112)
CO2: 23 mEq/L (ref 19–32)
Calcium: 8.8 mg/dL (ref 8.4–10.5)
Creatinine, Ser: 3.33 mg/dL — ABNORMAL HIGH (ref 0.50–1.35)
GFR calc non Af Amer: 16 mL/min — ABNORMAL LOW (ref >90)
GFR, EST AFRICAN AMERICAN: 19 mL/min — AB (ref >90)
Glucose, Bld: 259 mg/dL — ABNORMAL HIGH (ref 70–99)
Potassium: 4 mEq/L (ref 3.7–5.3)
Sodium: 140 mEq/L (ref 137–147)

## 2014-02-17 LAB — GLUCOSE, CAPILLARY
GLUCOSE-CAPILLARY: 146 mg/dL — AB (ref 70–99)
Glucose-Capillary: 238 mg/dL — ABNORMAL HIGH (ref 70–99)
Glucose-Capillary: 240 mg/dL — ABNORMAL HIGH (ref 70–99)
Glucose-Capillary: 89 mg/dL (ref 70–99)

## 2014-02-17 LAB — CBC WITH DIFFERENTIAL/PLATELET
BASOS ABS: 0 10*3/uL (ref 0.0–0.1)
BASOS PCT: 0 % (ref 0–1)
Eosinophils Absolute: 0 10*3/uL (ref 0.0–0.7)
Eosinophils Relative: 0 % (ref 0–5)
HCT: 38.6 % — ABNORMAL LOW (ref 39.0–52.0)
Hemoglobin: 12.9 g/dL — ABNORMAL LOW (ref 13.0–17.0)
LYMPHS PCT: 10 % — AB (ref 12–46)
Lymphs Abs: 1.3 10*3/uL (ref 0.7–4.0)
MCH: 30.9 pg (ref 26.0–34.0)
MCHC: 33.4 g/dL (ref 30.0–36.0)
MCV: 92.3 fL (ref 78.0–100.0)
Monocytes Absolute: 1.1 10*3/uL — ABNORMAL HIGH (ref 0.1–1.0)
Monocytes Relative: 9 % (ref 3–12)
NEUTROS PCT: 80 % — AB (ref 43–77)
Neutro Abs: 9.7 10*3/uL — ABNORMAL HIGH (ref 1.7–7.7)
Platelets: 102 10*3/uL — ABNORMAL LOW (ref 150–400)
RBC: 4.18 MIL/uL — ABNORMAL LOW (ref 4.22–5.81)
RDW: 14.7 % (ref 11.5–15.5)
WBC: 12.1 10*3/uL — AB (ref 4.0–10.5)

## 2014-02-17 LAB — LEGIONELLA ANTIGEN, URINE: LEGIONELLA ANTIGEN, URINE: NEGATIVE

## 2014-02-17 LAB — HEMOGLOBIN A1C
HEMOGLOBIN A1C: 7.6 % — AB (ref <5.7)
MEAN PLASMA GLUCOSE: 171 mg/dL — AB (ref <117)

## 2014-02-17 LAB — STREP PNEUMONIAE URINARY ANTIGEN: Strep Pneumo Urinary Antigen: NEGATIVE

## 2014-02-17 MED ORDER — ISOSORBIDE MONONITRATE 15 MG HALF TABLET
15.0000 mg | ORAL_TABLET | Freq: Every day | ORAL | Status: DC
  Administered 2014-02-17 – 2014-02-19 (×3): 15 mg via ORAL
  Filled 2014-02-17 (×3): qty 1

## 2014-02-17 MED ORDER — CALCITRIOL 0.25 MCG PO CAPS: 0.2500 ug | ORAL_CAPSULE | ORAL | Status: DC

## 2014-02-17 MED ORDER — SODIUM CHLORIDE 0.9 % IJ SOLN
3.0000 mL | Freq: Two times a day (BID) | INTRAMUSCULAR | Status: DC
  Administered 2014-02-17 – 2014-02-19 (×5): 3 mL via INTRAVENOUS

## 2014-02-17 MED ORDER — DEXTROSE 5 % IV SOLN
1.0000 g | INTRAVENOUS | Status: DC
  Administered 2014-02-17 – 2014-02-18 (×2): 1 g via INTRAVENOUS
  Filled 2014-02-17 (×3): qty 1

## 2014-02-17 MED ORDER — WARFARIN SODIUM 5 MG PO TABS
5.0000 mg | ORAL_TABLET | ORAL | Status: DC
  Filled 2014-02-17: qty 1

## 2014-02-17 MED ORDER — METOPROLOL SUCCINATE ER 100 MG PO TB24
100.0000 mg | ORAL_TABLET | Freq: Two times a day (BID) | ORAL | Status: DC
  Administered 2014-02-17 – 2014-02-19 (×5): 100 mg via ORAL
  Filled 2014-02-17 (×7): qty 1

## 2014-02-17 MED ORDER — WARFARIN - PHYSICIAN DOSING INPATIENT
Freq: Every day | Status: DC
  Administered 2014-02-19: 18:00:00

## 2014-02-17 MED ORDER — WARFARIN SODIUM 2.5 MG PO TABS
2.5000 mg | ORAL_TABLET | ORAL | Status: DC
  Administered 2014-02-17: 2.5 mg via ORAL
  Filled 2014-02-17: qty 1

## 2014-02-17 MED ORDER — ASPIRIN 81 MG PO CHEW
81.0000 mg | CHEWABLE_TABLET | Freq: Every day | ORAL | Status: DC
  Administered 2014-02-17 – 2014-02-19 (×3): 81 mg via ORAL
  Filled 2014-02-17 (×3): qty 1

## 2014-02-17 MED ORDER — INSULIN ASPART PROT & ASPART (70-30 MIX) 100 UNIT/ML ~~LOC~~ SUSP
15.0000 [IU] | Freq: Two times a day (BID) | SUBCUTANEOUS | Status: DC
  Administered 2014-02-17 – 2014-02-18 (×2): 15 [IU] via SUBCUTANEOUS
  Filled 2014-02-17: qty 10

## 2014-02-17 MED ORDER — NITROGLYCERIN 0.4 MG SL SUBL: 0.4000 mg | SUBLINGUAL_TABLET | SUBLINGUAL | Status: DC | PRN

## 2014-02-17 MED ORDER — INSULIN GLARGINE 100 UNIT/ML ~~LOC~~ SOLN
20.0000 [IU] | Freq: Every day | SUBCUTANEOUS | Status: DC
  Filled 2014-02-17: qty 0.2

## 2014-02-17 MED ORDER — HYDRALAZINE HCL 25 MG PO TABS
37.5000 mg | ORAL_TABLET | Freq: Three times a day (TID) | ORAL | Status: DC
  Administered 2014-02-17 – 2014-02-19 (×8): 37.5 mg via ORAL
  Filled 2014-02-17 (×11): qty 1.5

## 2014-02-17 MED ORDER — FUROSEMIDE 80 MG PO TABS
80.0000 mg | ORAL_TABLET | Freq: Two times a day (BID) | ORAL | Status: DC
  Administered 2014-02-17 – 2014-02-19 (×6): 80 mg via ORAL
  Filled 2014-02-17 (×7): qty 1

## 2014-02-17 MED ORDER — SODIUM CHLORIDE 0.9 % IV SOLN: 250.0000 mL | INTRAVENOUS | Status: DC | PRN

## 2014-02-17 MED ORDER — SODIUM CHLORIDE 0.9 % IJ SOLN: 3.0000 mL | INTRAMUSCULAR | Status: DC | PRN

## 2014-02-17 MED ORDER — DEXTROSE 5 % IV SOLN: 1.0000 g | Freq: Three times a day (TID) | INTRAVENOUS | Status: DC

## 2014-02-17 MED ORDER — ACETAMINOPHEN 325 MG PO TABS
650.0000 mg | ORAL_TABLET | Freq: Four times a day (QID) | ORAL | Status: DC | PRN
  Administered 2014-02-18: 650 mg via ORAL
  Filled 2014-02-17: qty 2

## 2014-02-17 MED ORDER — WARFARIN SODIUM 2.5 MG PO TABS: 2.5000 mg | ORAL_TABLET | ORAL | Status: DC

## 2014-02-17 MED ORDER — POTASSIUM CHLORIDE CRYS ER 20 MEQ PO TBCR
20.0000 meq | EXTENDED_RELEASE_TABLET | Freq: Two times a day (BID) | ORAL | Status: DC
  Administered 2014-02-17 – 2014-02-19 (×5): 20 meq via ORAL
  Filled 2014-02-17 (×7): qty 1

## 2014-02-17 MED ORDER — WARFARIN SODIUM 2.5 MG PO TABS
2.5000 mg | ORAL_TABLET | ORAL | Status: DC
  Filled 2014-02-17: qty 1

## 2014-02-17 MED ORDER — DIGOXIN 125 MCG PO TABS
0.1250 mg | ORAL_TABLET | ORAL | Status: DC
  Administered 2014-02-17 – 2014-02-19 (×2): 0.125 mg via ORAL
  Filled 2014-02-17 (×2): qty 1

## 2014-02-17 MED ORDER — INSULIN ASPART PROT & ASPART (70-30 MIX) 100 UNIT/ML ~~LOC~~ SUSP
20.0000 [IU] | Freq: Two times a day (BID) | SUBCUTANEOUS | Status: DC
  Administered 2014-02-17: 20 [IU] via SUBCUTANEOUS
  Filled 2014-02-17: qty 10

## 2014-02-17 MED ORDER — VANCOMYCIN HCL IN DEXTROSE 1-5 GM/200ML-% IV SOLN
1000.0000 mg | INTRAVENOUS | Status: DC
  Filled 2014-02-17: qty 200

## 2014-02-17 MED ORDER — INSULIN ASPART 100 UNIT/ML ~~LOC~~ SOLN
0.0000 [IU] | Freq: Three times a day (TID) | SUBCUTANEOUS | Status: DC
  Administered 2014-02-17 (×2): 3 [IU] via SUBCUTANEOUS
  Administered 2014-02-18: 2 [IU] via SUBCUTANEOUS
  Administered 2014-02-18 (×2): 3 [IU] via SUBCUTANEOUS
  Administered 2014-02-19 (×3): 2 [IU] via SUBCUTANEOUS

## 2014-02-17 NOTE — Progress Notes (Signed)
ANTIBIOTIC CONSULT NOTE - INITIAL  Pharmacy Consult for Vancomycin  Indication: rule out pneumonia  Allergies  Allergen Reactions  . Ace Inhibitors     Stopped by Dr. Burt Knack due to progressive renal failure  . Prednisone Other (See Comments)    Raised blood sugar    Patient Measurements: Height: 6' (182.9 cm) Weight: 185 lb (83.915 kg) IBW/kg (Calculated) : 77.6  Vital Signs: Temp: 102.8 F (39.3 C) (05/28 2213) Temp src: Rectal (05/28 2213) BP: 151/64 mmHg (05/28 2230) Pulse Rate: 70 (05/28 2230)  Labs:  Recent Labs  02/16/14 0829 02/16/14 2122  WBC  --  8.6  HGB  --  14.2  PLT  --  121*  CREATININE 3.5* 3.26*   Estimated Creatinine Clearance: 19.5 ml/min (by C-G formula based on Cr of 3.26).  Medical History: Past Medical History  Diagnosis Date  . Chronic atrial fibrillation     a. Historically difficult rate control. b. s/p CRT-D implantation with anticipation of AV junction ablation but patient then was able to accomplish rate control and ablation not undertaken.; AVN ablation 08/2013 by Dr Lovena Le  . CARDIOMYOPATHY, PRIMARY, DILATED     a. Mixed ICM/NICM - EF 15% by echo 05/2009; St. Jude CRT-D implanted 06/2009. b. Echo 04/2013 - EF 20%, mild LVH.  [name] COLON CANCER, HX OF     a. s/p partial colectomy.  . Chronic systolic CHF (congestive heart failure)   . CORONARY ARTERY DISEASE     a. BMS to LAD 04/2008. b. Cath 06/2009: nonobstructive disease.  [name] PROSTATE CANCER, HX OF     a. s/p radical prostatectomy.  [name] SKIN CANCER, HX OF   . SLEEP APNEA   . Hypertension   . Hyperlipidemia   . Diabetes mellitus   . Osteoarthritis   . Tubular adenoma of colon   . Secondary hyperparathyroidism   . CKD (chronic kidney disease), stage IV     a. Right brachiocephalic AV fistula 0/9628. Neph = Dunham.  . Vertigo   . Valvular heart disease     a. Echo 04/2013: mild AI, mild MR.  . Pulmonary HTN     a. PA pressure 64mmHg by echo 05/01/13.    Assessment: 80 y/o M to  start vancomycin/cefepime for likely PNA on CXR. WBC 8.6, CKD with Scr 3.26, other labs as above.   Goal of Therapy:  Vancomycin trough level 15-20 mcg/ml  Plan:  -Vancomycin 1000 mg IV q48h -Cefepime adjusted to 1g IV q24h -Trend WBC, temp, renal function  -Drug levels as indicated   Narda Bonds 02/17/2014,12:23 AM

## 2014-02-17 NOTE — Progress Notes (Signed)
PROGRESS NOTE  [name] [name]. IRS:[id number] DOB: 1930-11-27 DOA: 02/16/2014 PCP: Chancy Hurter, MD  Assessment/Plan: Healthcare associated pneumonia -Continue vancomycin and cefepime -start antitussive -incentive spirometry CKD stage IV -Baseline creatinine 3.2-3.6 -Continue furosemide -Monitor BMP Diabetes mellitus type 2, with renal complications -d/c lantus, start 70/30 -70/30, 20 units bid while in hospital--adjust as needed based on CBGs -02/16/2049 hemoglobin A1c 7.9 -NovoLog sliding scale Chronic atrial fibrillation -Rate controlled -Continue warfarin -Continue metoprolol succinate Ischemic cardiomyopathy -EF 20-25%--status post ICD -Continue aspirin -Continue metoprolol succinate, hydralazine, Imdur  Hypertension -Continue antihypertensive regimen as discussed above   Family Communication:   Daughter updated at beside, total time 35 min, >50% spent counseling pt and coordinating care Disposition Plan:   Home when medically stable   Antibiotics:  vanco 02/16/14>>>  Cefepime 02/16/14>>>    Procedures/Studies: Dg Chest Port 1 View  02/16/2014   CLINICAL DATA:  Weakness, diarrhea  EXAM: PORTABLE CHEST - 1 VIEW  COMPARISON:  Prior chest x-ray 11/22/2013  FINDINGS: Stable position of left biventricular cardiac rhythm maintenance device. Leads project over the right ventricle and within a cardiac vein overlying the left ventricle. Stable cardiomegaly. Atherosclerotic calcification present in the transverse aorta. Nonspecific patchy interstitial airspace opacity in the left mid lung. Otherwise, the lungs are clear. No pleural effusion or pneumothorax. No acute osseous abnormality.  IMPRESSION: 1. New patchy interstitial opacity in the left mid lung concerning for early bronchopneumonia. 2. Stable positioning of left subclavian approach biventricular cardiac rhythm maintenance device.   Electronically Signed   By: Jacqulynn Cadet M.D.   On: 02/16/2014  22:10         Subjective:  patient is feeling better but continues to complain of a nonproductive cough. Denies any fevers, chills, vomiting, diarrhea, abdominal pain, dysuria. He has some nausea. He ate 50% of his breakfast. Denies any dysuria, hematuria hematochezia, melena  Objective: Filed Vitals:   02/16/14 2230 02/17/14 0045 02/17/14 0101 02/17/14 0609  BP: 151/64  137/57 154/64  Pulse: 70  79 70  Temp:  99.1 F (37.3 C) 98.4 F (36.9 C) 98.5 F (36.9 C)  TempSrc:  Oral Oral Oral  Resp: 22  18 18   Height:   5\' 11"  (1.803 m)   Weight:   82.373 kg (181 lb 9.6 oz)   SpO2: 93%  93% 93%    Intake/Output Summary (Last 24 hours) at 02/17/14 1223 Last data filed at 02/17/14 1140  Gross per 24 hour  Intake    570 ml  Output    740 ml  Net   -170 ml   Weight change:  Exam:   General:  Pt is alert, follows commands appropriately, not in acute distress  HEENT: No icterus, No thrush,  [place]/AT  Cardiovascular: RRR, S1/S2, no rubs, no gallops  Respiratory: Left basilar crackles. No wheezing. Good air movement.  Abdomen: Soft/+BS, non tender, non distended, no guarding  Extremities: No edema, No lymphangitis, No petechiae, No rashes, no synovitis  Data Reviewed: Basic Metabolic Panel:  Recent Labs Lab 02/16/14 0829 02/16/14 2122 02/17/14 0702  NA 140 140 140  K 3.5 3.7 4.0  CL 101 100 101  CO2 29 22 23   GLUCOSE 159* 245* 259*  BUN 54* 59* 57*  CREATININE 3.5* 3.26* 3.33*  CALCIUM 9.2 9.4 8.8   Liver Function Tests:  Recent Labs Lab 02/16/14 0829  AST 17  ALT 15  ALKPHOS 36*  BILITOT 1.1  PROT 6.7  ALBUMIN 3.7    Recent Labs Lab 02/16/14 2122  LIPASE 115*   No results found for this basename: AMMONIA,  in the last 168 hours CBC:  Recent Labs Lab 02/16/14 2122 02/17/14 0702  WBC 8.6 12.1*  NEUTROABS  --  9.7*  HGB 14.2 12.9*  HCT 42.1 38.6*  MCV 91.9 92.3  PLT 121* 102*   Cardiac Enzymes: No results found for this basename:  CKTOTAL, CKMB, CKMBINDEX, TROPONINI,  in the last 168 hours BNP: No components found with this basename: POCBNP,  CBG:  Recent Labs Lab 02/17/14 0716 02/17/14 1129  GLUCAP 238* 240*    No results found for this or any previous visit (from the past 240 hour(s)).   Scheduled Meds: . aspirin  81 mg Oral Daily  . [START ON 02/20/2014] calcitRIOL  0.25 mcg Oral Once per day on Mon Thu  . ceFEPime (MAXIPIME) IV  1 g Intravenous Q24H  . digoxin  0.125 mg Oral QODAY  . furosemide  80 mg Oral BID  . hydrALAZINE  37.5 mg Oral 3 times per day  . insulin aspart  0-9 Units Subcutaneous TID WC  . insulin aspart protamine- aspart  20 Units Subcutaneous BID WC  . isosorbide mononitrate  15 mg Oral Daily  . metoprolol succinate  100 mg Oral BID  . potassium chloride SA  20 mEq Oral BID  . sodium chloride  3 mL Intravenous Q12H  . [START ON 02/19/2014] vancomycin  1,000 mg Intravenous Q48H  . warfarin  2.5 mg Oral Once per day on Mon Fri   And  . [START ON 02/18/2014] warfarin  5 mg Oral Once per day on Sun Tue Wed Thu Sat  . Warfarin - Physician Dosing Inpatient   Does not apply q1800   Continuous Infusions:    Geraldo Docker  [facility] Pager [phone]  If 7PM-7AM, please contact night-coverage [email or web address] Password [email or web address] 02/17/2014, 12:23 PM   LOS: 1 day

## 2014-02-17 NOTE — Progress Notes (Signed)
ANTICOAGULATION CONSULT NOTE - Initial Consult  Pharmacy Consult for Coumadin Indication: h/o afib  Allergies  Allergen Reactions  . Ace Inhibitors     Stopped by Dr. Burt Knack due to progressive renal failure  . Prednisone Other (See Comments)    Raised blood sugar    Patient Measurements: Height: 5\' 11"  (180.3 cm) Weight: 181 lb 9.6 oz (82.373 kg) IBW/kg (Calculated) : 75.3   Vital Signs: Temp: 98.5 F (36.9 C) (05/29 0609) Temp src: Oral (05/29 0609) BP: 154/64 mmHg (05/29 0609) Pulse Rate: 70 (05/29 0609)  Labs:  Recent Labs  02/16/14 02/16/14 0829 02/16/14 2122 02/17/14 0702  HGB  --   --  14.2 12.9*  HCT  --   --  42.1 38.6*  PLT  --   --  121* 102*  LABPROT  --   --  23.2*  --   INR 1.9  --  2.14*  --   CREATININE  --  3.5* 3.26* 3.33*    Estimated Creatinine Clearance: 18.5 ml/min (by C-G formula based on Cr of 3.33).   Medical History: Past Medical History  Diagnosis Date  . Chronic atrial fibrillation     a. Historically difficult rate control. b. s/p CRT-D implantation with anticipation of AV junction ablation but patient then was able to accomplish rate control and ablation not undertaken.; AVN ablation 08/2013 by Dr Lovena Le  . CARDIOMYOPATHY, PRIMARY, DILATED     a. Mixed ICM/NICM - EF 15% by echo 05/2009; [employer] CRT-D implanted 06/2009. b. Echo 04/2013 - EF 20%, mild LVH.  [name] COLON CANCER, HX OF     a. s/p partial colectomy.  . Chronic systolic CHF (congestive heart failure)   . CORONARY ARTERY DISEASE     a. BMS to LAD 04/2008. b. Cath 06/2009: nonobstructive disease.  [name] PROSTATE CANCER, HX OF     a. s/p radical prostatectomy.  [name] SKIN CANCER, HX OF   . SLEEP APNEA   . Hypertension   . Hyperlipidemia   . Diabetes mellitus   . Osteoarthritis   . Tubular adenoma of colon   . Secondary hyperparathyroidism   . CKD (chronic kidney disease), stage IV     a. Right brachiocephalic AV fistula 11/5571. Neph = [place].  . Vertigo   . Valvular heart  disease     a. Echo 04/2013: mild AI, mild MR.  . Pulmonary HTN     a. PA pressure 58mmHg by echo 05/01/13.    Medications:  Prescriptions prior to admission  Medication Sig Dispense Refill  . aspirin 81 MG chewable tablet Chew 81 mg by mouth daily.      . calcitRIOL (ROCALTROL) 0.25 MCG capsule Take 1 capsule (0.25 mcg total) by mouth as directed. Taking only on Monday and Thursday      . digoxin (LANOXIN) 0.125 MG tablet Take 1 tablet (0.125 mg total) by mouth every other day.      . furosemide (LASIX) 80 MG tablet Take 1 tablet (80 mg total) by mouth 2 (two) times daily.  180 tablet  2  . hydrALAZINE (APRESOLINE) 25 MG tablet Take 1.5 tablets (37.5 mg total) by mouth 3 (three) times daily.  180 tablet  6  . insulin lispro protamine-lispro (HUMALOG 75/25 MIX) (75-25) 100 UNIT/ML SUSP injection Inject 45 Units into the skin 2 (two) times daily with a meal.      . isosorbide mononitrate (IMDUR) 30 MG 24 hr tablet Take 0.5 tablets (15 mg total) by mouth daily.  90 tablet  3  . metoprolol succinate (TOPROL-XL) 100 MG 24 hr tablet Take 100 mg by mouth 2 (two) times daily. Take with or immediately following a meal.      . potassium chloride SA (K-DUR,KLOR-CON) 20 MEQ tablet Take 20 mEq by mouth 2 (two) times daily.       [name] warfarin (COUMADIN) 5 MG tablet Take 2.5-5 mg by mouth See admin instructions. On Monday and Friday take 2.5 mg; On Tuesday, Wednesday, Thursday, Saturday, and Sunday takes 5 mg.      [name] glucose blood (FREESTYLE TEST STRIPS) test strip 1 each by Other route as needed. Use as instructed       . nitroGLYCERIN (NITROSTAT) 0.4 MG SL tablet Place 0.4 mg under the tongue every 5 (five) minutes x 3 doses as needed for chest pain.       Scheduled:  . aspirin  81 mg Oral Daily  . [START ON 02/20/2014] calcitRIOL  0.25 mcg Oral Once per day on Mon Thu  . ceFEPime (MAXIPIME) IV  1 g Intravenous Q24H  . digoxin  0.125 mg Oral QODAY  . furosemide  80 mg Oral BID  . hydrALAZINE  37.5 mg Oral  3 times per day  . insulin aspart  0-9 Units Subcutaneous TID WC  . insulin aspart protamine- aspart  20 Units Subcutaneous BID WC  . isosorbide mononitrate  15 mg Oral Daily  . metoprolol succinate  100 mg Oral BID  . potassium chloride SA  20 mEq Oral BID  . sodium chloride  3 mL Intravenous Q12H  . [START ON 02/19/2014] vancomycin  1,000 mg Intravenous Q48H  . warfarin  2.5 mg Oral Once per day on Mon Fri   And  . [START ON 02/18/2014] warfarin  5 mg Oral Once per day on Sun Tue Wed Thu Sat  . Warfarin - Physician Dosing Inpatient   Does not apply q1800    Assessment: 80 y.o male on chronic coumadin PTA for h/o afib,  He presented to ED last night with weakness, vomiting, and diarrhea.  Associated symptoms include chest pain and shortness of breath.  He had an anticoagulation outpatient office visit yesterday 02/16/14 in which the INR was 1.9.  Office notes indicate patient instructed to take an extra 1/2 tab yesterday only then continue 1 tablet (5 mg) daily except 1/2 tablet (=2.5 mg) on M/F.  Last night on admission his INR was 2.14.    Today Hgb is 12.9 down from 14.2 and PLTC down to 102 from 121K on admit. No bleeding noted.  PMH is as noted above.  He was started on IV vancomycin and cefepime for R/O PNA. SCR 3.33,  h/o CKD.    Goal of Therapy:  INR 2-3 Monitor platelets by anticoagulation protocol: Yes   Plan:  Continue home coumadin dose 5 mg daily except give 2.5 mg qMon & Friday. (dose due today is 2.5 mg) Daily PT/INR. Follow up INR tomorrow.   Nicole Cella, RPh Clinical Pharmacist Pager: [phone] 02/17/2014,10:25 AM

## 2014-02-18 DIAGNOSIS — I428 Other cardiomyopathies: Secondary | ICD-10-CM

## 2014-02-18 LAB — PROTIME-INR
INR: 1.7 — ABNORMAL HIGH (ref 0.00–1.49)
Prothrombin Time: 19.5 seconds — ABNORMAL HIGH (ref 11.6–15.2)

## 2014-02-18 LAB — BASIC METABOLIC PANEL
BUN: 63 mg/dL — ABNORMAL HIGH (ref 6–23)
CO2: 23 meq/L (ref 19–32)
CREATININE: 3.34 mg/dL — AB (ref 0.50–1.35)
Calcium: 8.9 mg/dL (ref 8.4–10.5)
Chloride: 100 mEq/L (ref 96–112)
GFR calc Af Amer: 18 mL/min — ABNORMAL LOW (ref >90)
GFR calc non Af Amer: 16 mL/min — ABNORMAL LOW (ref >90)
Glucose, Bld: 211 mg/dL — ABNORMAL HIGH (ref 70–99)
Potassium: 4.1 mEq/L (ref 3.7–5.3)
Sodium: 138 mEq/L (ref 137–147)

## 2014-02-18 LAB — CBC
HCT: 38.1 % — ABNORMAL LOW (ref 39.0–52.0)
HEMOGLOBIN: 12.7 g/dL — AB (ref 13.0–17.0)
MCH: 30.8 pg (ref 26.0–34.0)
MCHC: 33.3 g/dL (ref 30.0–36.0)
MCV: 92.3 fL (ref 78.0–100.0)
PLATELETS: 112 10*3/uL — AB (ref 150–400)
RBC: 4.13 MIL/uL — AB (ref 4.22–5.81)
RDW: 14.5 % (ref 11.5–15.5)
WBC: 9.8 10*3/uL (ref 4.0–10.5)

## 2014-02-18 LAB — GLUCOSE, CAPILLARY
GLUCOSE-CAPILLARY: 226 mg/dL — AB (ref 70–99)
GLUCOSE-CAPILLARY: 128 mg/dL — AB (ref 70–99)
Glucose-Capillary: 192 mg/dL — ABNORMAL HIGH (ref 70–99)

## 2014-02-18 LAB — TROPONIN I: Troponin I: <0.3 ng/mL (ref <0.30)

## 2014-02-18 MED ORDER — HEPARIN SODIUM (PORCINE) 5000 UNIT/ML IJ SOLN: 5000.0000 [IU] | Freq: Three times a day (TID) | INTRAMUSCULAR | Status: DC

## 2014-02-18 MED ORDER — HYDROCODONE-HOMATROPINE 5-1.5 MG/5ML PO SYRP
5.0000 mL | ORAL_SOLUTION | ORAL | Status: DC | PRN
  Administered 2014-02-18 – 2014-02-19 (×2): 5 mL via ORAL
  Filled 2014-02-18 (×2): qty 5

## 2014-02-18 MED ORDER — WARFARIN SODIUM 7.5 MG PO TABS
7.5000 mg | ORAL_TABLET | Freq: Once | ORAL | Status: AC
  Administered 2014-02-18: 7.5 mg via ORAL
  Filled 2014-02-18: qty 1

## 2014-02-18 MED ORDER — SALINE SPRAY 0.65 % NA SOLN
1.0000 | NASAL | Status: DC | PRN
  Filled 2014-02-18: qty 44

## 2014-02-18 MED ORDER — INSULIN ASPART PROT & ASPART (70-30 MIX) 100 UNIT/ML ~~LOC~~ SUSP
20.0000 [IU] | Freq: Two times a day (BID) | SUBCUTANEOUS | Status: DC
  Administered 2014-02-18 – 2014-02-19 (×3): 20 [IU] via SUBCUTANEOUS
  Filled 2014-02-18: qty 10

## 2014-02-18 NOTE — Discharge Summary (Addendum)
Physician Discharge Summary  [name] [name]. TFT:[id number] DOB: [birth year] DOA: 02/16/2014  PCP: Chancy Hurter, MD  Admit date: 02/16/2014 Discharge date:02/19/14 Recommendations for Outpatient Follow-up:  1. Pt will need to follow up with PCP --pt has appt on 02/21/14 2. Please obtain BMP on 02/21/14 3. Please also check INR on 02/21/14 as adjust coumadin accordingly  Discharge Diagnoses:  Healthcare associated pneumonia  -Continue vancomycin and cefepime--patient received nearly 3 days  -start hycodan for cough  -incentive spirometry  -The patient will discharge home on levofloxacin 750 mg every 48 hours with his next dose on 02/21/2014; his final dose will be on 02/23/2014 which will complete 7 days of therapy CKD stage IV  -Baseline creatinine 3.2-3.6  -Continue furosemide  -Monitor BMP--serum creatinine 3.42 on the day of discharge  Atypical Chest pain  -likely related to pneumonia--worse with cough -hycodan as discussed which helped with cough and CP -EKG-RBBB unchanged -cycle troponins--neg x 3  -repeat CXR on day of d/c showed improving but persistent lingular infiltrate without any other acute abnormalities Diabetes mellitus type 2, with renal complications  -d/c lantus, start 70/30  -70/30, 20 units bid while in hospital--adjust as needed based on CBGs  -02/16/2014 hemoglobin A1c 7.9  -NovoLog sliding scale  -CBGs were well controlled during the hospitalization on half of his home dose -Patient indicated that he was having several low blood sugars at home as he was not eating nearly as well as he use to -Plan to discharge on 70/25, 20 units twice a day -he will go home on his usual home dose of insulin and check CBGs 4X/day and keep a glycemic log with which he will take to his PCP to adjust his insulin regimen Chronic atrial fibrillation  -Rate controlled  -Continue warfarin  -Continue metoprolol succinate as the patient will be placed on levofloxacin, the patient  needs to have his INR monitored more frequent until he is finished with his antibiotics - he needs to have his INR checked on 02/21/2014  Ischemic cardiomyopathy  -EF 20-25%--status post ICD  -Continue aspirin  -Continue metoprolol succinate, hydralazine, Imdur  Hypertension  -Continue antihypertensive regimen as discussed above  Family Communication: Pt at beside  Disposition Plan: Home when medically stable   Discharge Condition: stable  Disposition: home  Diet:heart healthy Wt Readings from Last 3 Encounters:  02/18/14 82.918 kg (182 lb 12.8 oz)  02/15/14 84.823 kg (187 lb)  12/20/13 84.823 kg (187 lb)    History of present illness:  80 yo male with a history of CKD stage IV, chronic atrial fibrillation on anticoagulation, dilated cardiomyopathy, chronic systolic CHF, and diabetes mellitus presents  with 2 days of worsening cough, had chills all day on the day of admission. The patient was also found to be lethargic by his family. He then vomited once and had one episode of diarrhea, both nonbloody.  Has pleuritic chest pain on the lower left side when coughing a lot. In addition, the patient was found to have temperature 102.38F upon arrival. Patient was treated with intravenous antibiotics. He was empirically started on vancomycin and cefepime. The patient significantly improved and his mentation also improved. The patient's renal function remains stable. He was continued on his home dose of furosemide. He remained hemodynamically stable. Troponins were cycled and were negative. EKG did not show any ischemic changes.The patient complained of pleuritic chest pain. This is likely due to his pneumonia. He was worsened with coughing. The patient was placed on Hycodan. His cough and  chest pain improved.     Discharge Exam: Filed Vitals:   02/19/14 0944  BP: 147/69  Pulse: 81  Temp:   Resp:    Filed Vitals:   02/19/14 0129 02/19/14 0617 02/19/14 0635 02/19/14 0944  BP: 161/53  162/62 165/58 147/69  Pulse: 75  69 81  Temp:   99.6 F (37.6 C)   TempSrc:      Resp:   16   Height:      Weight:      SpO2: 93%  95%    General: A&O x 3, NAD, pleasant, cooperative Cardiovascular: RRR, no rub, no gallop, no S3 Respiratory: Bibasilar crackles L>R, regular rhythm. No wheezing. Good air movement. Abdomen:soft, nontender, nondistended, positive bowel sounds Extremities: No edema, No lymphangitis, no petechiae  Discharge Instructions     Medication List         aspirin 81 MG chewable tablet  Chew 81 mg by mouth daily.     calcitRIOL 0.25 MCG capsule  Commonly known as:  ROCALTROL  Take 1 capsule (0.25 mcg total) by mouth as directed. Taking only on Monday and Thursday     digoxin 0.125 MG tablet  Commonly known as:  LANOXIN  Take 1 tablet (0.125 mg total) by mouth every other day.     FREESTYLE TEST STRIPS test strip  Generic drug:  glucose blood  1 each by Other route as needed. Use as instructed     furosemide 80 MG tablet  Commonly known as:  LASIX  Take 1 tablet (80 mg total) by mouth 2 (two) times daily.     hydrALAZINE 25 MG tablet  Commonly known as:  APRESOLINE  Take 1.5 tablets (37.5 mg total) by mouth 3 (three) times daily.     HYDROcodone-homatropine 5-1.5 MG/5ML syrup  Commonly known as:  HYCODAN  Take 5 mLs by mouth every 4 (four) hours as needed for cough.     insulin lispro protamine-lispro (75-25) 100 UNIT/ML Susp injection  Commonly known as:  HUMALOG 75/25 MIX  Inject 20 Units into the skin 2 (two) times daily with a meal.     isosorbide mononitrate 30 MG 24 hr tablet  Commonly known as:  IMDUR  Take 0.5 tablets (15 mg total) by mouth daily.     levofloxacin 500 MG tablet  Commonly known as:  LEVAQUIN  Take 1 tablet (500 mg total) by mouth every other day. Start 02/21/14     metoprolol succinate 100 MG 24 hr tablet  Commonly known as:  TOPROL-XL  Take 100 mg by mouth 2 (two) times daily. Take with or immediately following a  meal.     nitroGLYCERIN 0.4 MG SL tablet  Commonly known as:  NITROSTAT  Place 0.4 mg under the tongue every 5 (five) minutes x 3 doses as needed for chest pain.     potassium chloride SA 20 MEQ tablet  Commonly known as:  K-DUR,KLOR-CON  Take 20 mEq by mouth 2 (two) times daily.     warfarin 5 MG tablet  Commonly known as:  COUMADIN  Take 2.5-5 mg by mouth See admin instructions. On Monday and Friday take 2.5 mg; On Tuesday, Wednesday, Thursday, Saturday, and Sunday takes 5 mg.         The results of significant diagnostics from this hospitalization (including imaging, microbiology, ancillary and laboratory) are listed below for reference.    Significant Diagnostic Studies: Dg Chest Port 1 View  02/16/2014   CLINICAL DATA:  Weakness, diarrhea  EXAM: PORTABLE CHEST - 1 VIEW  COMPARISON:  Prior chest x-ray 11/22/2013  FINDINGS: Stable position of left biventricular cardiac rhythm maintenance device. Leads project over the right ventricle and within a cardiac vein overlying the left ventricle. Stable cardiomegaly. Atherosclerotic calcification present in the transverse aorta. Nonspecific patchy interstitial airspace opacity in the left mid lung. Otherwise, the lungs are clear. No pleural effusion or pneumothorax. No acute osseous abnormality.  IMPRESSION: 1. New patchy interstitial opacity in the left mid lung concerning for early bronchopneumonia. 2. Stable positioning of left subclavian approach biventricular cardiac rhythm maintenance device.   Electronically Signed   By: Jacqulynn Cadet M.D.   On: 02/16/2014 22:10     Microbiology: Recent Results (from the past 240 hour(s))  CULTURE, BLOOD (ROUTINE X 2)     Status: None   Collection Time    02/16/14 10:44 PM      Result Value Ref Range Status   Specimen Description BLOOD LEFT ARM   Final   Special Requests BOTTLES DRAWN AEROBIC AND ANAEROBIC 10CC   Final   Culture  Setup Time     Final   Value: 02/17/2014 03:58     Performed  at [facility]   Culture     Final   Value:        BLOOD CULTURE RECEIVED NO GROWTH TO DATE CULTURE WILL BE HELD FOR 5 DAYS BEFORE ISSUING A FINAL NEGATIVE REPORT     Performed at [facility]   Report Status PENDING   Incomplete  CULTURE, BLOOD (ROUTINE X 2)     Status: None   Collection Time    02/16/14 10:50 PM      Result Value Ref Range Status   Specimen Description BLOOD LEFT HAND   Final   Special Requests BOTTLES DRAWN AEROBIC ONLY 10CC   Final   Culture  Setup Time     Final   Value: 02/17/2014 03:21     Performed at [facility]   Culture     Final   Value:        BLOOD CULTURE RECEIVED NO GROWTH TO DATE CULTURE WILL BE HELD FOR 5 DAYS BEFORE ISSUING A FINAL NEGATIVE REPORT     Performed at [facility]   Report Status PENDING   Incomplete     Labs: Basic Metabolic Panel:  Recent Labs Lab 02/16/14 0829 02/16/14 2122 02/17/14 0702 02/18/14 0554 02/19/14 0453  NA 140 140 140 138 141  K 3.5 3.7 4.0 4.1 3.4*  CL 101 100 101 100 99  CO2 29 22 23 23 25   GLUCOSE 159* 245* 259* 211* 151*  BUN 54* 59* 57* 63* 65*  CREATININE 3.5* 3.26* 3.33* 3.34* 3.42*  CALCIUM 9.2 9.4 8.8 8.9 9.3   Liver Function Tests:  Recent Labs Lab 02/16/14 0829  AST 17  ALT 15  ALKPHOS 36*  BILITOT 1.1  PROT 6.7  ALBUMIN 3.7    Recent Labs Lab 02/16/14 2122  LIPASE 115*   No results found for this basename: AMMONIA,  in the last 168 hours CBC:  Recent Labs Lab 02/16/14 2122 02/17/14 0702 02/18/14 0554 02/19/14 0453  WBC 8.6 12.1* 9.8 9.2  NEUTROABS  --  9.7*  --   --   HGB 14.2 12.9* 12.7* 13.2  HCT 42.1 38.6* 38.1* 39.0  MCV 91.9 92.3 92.3 91.1  PLT 121* 102* 112* 130*   Cardiac Enzymes:  Recent Labs Lab 02/18/14 1525 02/18/14 2036 02/19/14 0455  TROPONINI <0.30 <0.30 <0.30   BNP: No components found with this basename: POCBNP,  CBG:  Recent Labs Lab 02/18/14 0720 02/18/14 1124 02/18/14 1630 02/18/14 2057  02/19/14 0714  GLUCAP 226* 227* 192* 128* 156*    Time coordinating discharge:  Greater than 30 minutes  Signed:  Orson Eva, DO [facility] Pager: [phone] 02/19/2014, 11:35 AM

## 2014-02-18 NOTE — Progress Notes (Signed)
PROGRESS NOTE  [name] [name]. TFT:[id number] DOB: [birth year] DOA: 02/16/2014 PCP: Chancy Hurter, MD  Assessment/Plan: Healthcare associated pneumonia  -Continue vancomycin and cefepime  -start hycodan  -incentive spirometry  CKD stage IV  -Baseline creatinine 3.2-3.6  -Continue furosemide  -Monitor BMP  Atypical Chest pain -likely related to pneumonia -EKG -cycle troponins Diabetes mellitus type 2, with renal complications  -d/c lantus, start 70/30  -70/30, 20 units bid while in hospital--adjust as needed based on CBGs  -02/16/2049 hemoglobin A1c 7.9  -NovoLog sliding scale  Chronic atrial fibrillation  -Rate controlled  -Continue warfarin  -Continue metoprolol succinate  Ischemic cardiomyopathy  -EF 20-25%--status post ICD  -Continue aspirin  -Continue metoprolol succinate, hydralazine, Imdur  Hypertension  -Continue antihypertensive regimen as discussed above   Family Communication:   Pt at beside Disposition Plan:   Home when medically stable       Procedures/Studies: Dg Chest Port 1 View  02/16/2014   CLINICAL DATA:  Weakness, diarrhea  EXAM: PORTABLE CHEST - 1 VIEW  COMPARISON:  Prior chest x-ray 11/22/2013  FINDINGS: Stable position of left biventricular cardiac rhythm maintenance device. Leads project over the right ventricle and within a cardiac vein overlying the left ventricle. Stable cardiomegaly. Atherosclerotic calcification present in the transverse aorta. Nonspecific patchy interstitial airspace opacity in the left mid lung. Otherwise, the lungs are clear. No pleural effusion or pneumothorax. No acute osseous abnormality.  IMPRESSION: 1. New patchy interstitial opacity in the left mid lung concerning for early bronchopneumonia. 2. Stable positioning of left subclavian approach biventricular cardiac rhythm maintenance device.   Electronically Signed   By: Jacqulynn Cadet M.D.   On: 02/16/2014 22:10         Subjective: This or  complaints of left-sided chest pain worsen with inspiration. He also has worsening chest pain with cough. Denies any hemoptysis. He denies any shortness of breath, nausea, vomiting, dizziness, headache, fevers, chills, diarrhea, abdominal pain, dysuria, hematuria.  Objective: Filed Vitals:   02/17/14 1422 02/17/14 2010 02/18/14 0519 02/18/14 1348  BP: 123/61 136/58 146/70 137/63  Pulse: 75 70 51 70  Temp: 99.1 F (37.3 C) 98.3 F (36.8 C) 97.9 F (36.6 C) 97.7 F (36.5 C)  TempSrc: Oral Oral Oral Oral  Resp: 17 18 18 19   Height:      Weight:   82.918 kg (182 lb 12.8 oz)   SpO2: 92% 93% 93% 96%    Intake/Output Summary (Last 24 hours) at 02/18/14 1445 Last data filed at 02/18/14 1300  Gross per 24 hour  Intake    360 ml  Output   1675 ml  Net  -1315 ml   Weight change: -0.998 kg (-2 lb 3.2 oz) Exam:   General:  Pt is alert, follows commands appropriately, not in acute distress  HEENT: No icterus, No thrush, No neck mass, Goodman/AT  Cardiovascular: RRR, S1/S2, no rubs, no gallops  Respiratory: Bibasilar crackles, right greater than left. No wheezing. Good air movement.  Abdomen: Soft/+BS, non tender, non distended, no guarding  Extremities: No edema, No lymphangitis, No petechiae, No rashes, no synovitis  Data Reviewed: Basic Metabolic Panel:  Recent Labs Lab 02/16/14 0829 02/16/14 2122 02/17/14 0702 02/18/14 0554  NA 140 140 140 138  K 3.5 3.7 4.0 4.1  CL 101 100 101 100  CO2 29 22 23 23   GLUCOSE 159* 245* 259* 211*  BUN 54* 59* 57* 63*  CREATININE 3.5* 3.26* 3.33* 3.34*  CALCIUM 9.2 9.4 8.8 8.9   Liver Function Tests:  Recent Labs Lab 02/16/14 0829  AST 17  ALT 15  ALKPHOS 36*  BILITOT 1.1  PROT 6.7  ALBUMIN 3.7    Recent Labs Lab 02/16/14 2122  LIPASE 115*   No results found for this basename: AMMONIA,  in the last 168 hours CBC:  Recent Labs Lab 02/16/14 2122 02/17/14 0702 02/18/14 0554  WBC 8.6 12.1* 9.8  NEUTROABS  --  9.7*  --     HGB 14.2 12.9* 12.7*  HCT 42.1 38.6* 38.1*  MCV 91.9 92.3 92.3  PLT 121* 102* 112*   Cardiac Enzymes: No results found for this basename: CKTOTAL, CKMB, CKMBINDEX, TROPONINI,  in the last 168 hours BNP: No components found with this basename: POCBNP,  CBG:  Recent Labs Lab 02/17/14 0716 02/17/14 1129 02/17/14 1641 02/17/14 2016 02/18/14 0720  GLUCAP 238* 240* 89 146* 226*    No results found for this or any previous visit (from the past 240 hour(s)).   Scheduled Meds: . aspirin  81 mg Oral Daily  . [START ON 02/20/2014] calcitRIOL  0.25 mcg Oral Once per day on Mon Thu  . ceFEPime (MAXIPIME) IV  1 g Intravenous Q24H  . digoxin  0.125 mg Oral QODAY  . furosemide  80 mg Oral BID  . hydrALAZINE  37.5 mg Oral 3 times per day  . insulin aspart  0-9 Units Subcutaneous TID WC  . insulin aspart protamine- aspart  15 Units Subcutaneous BID WC  . isosorbide mononitrate  15 mg Oral Daily  . metoprolol succinate  100 mg Oral BID  . potassium chloride SA  20 mEq Oral BID  . sodium chloride  3 mL Intravenous Q12H  . [START ON 02/19/2014] vancomycin  1,000 mg Intravenous Q48H  . warfarin  7.5 mg Oral ONCE-1800  . Warfarin - Physician Dosing Inpatient   Does not apply q1800   Continuous Infusions:    Geraldo Docker  [facility] Pager [phone]  If 7PM-7AM, please contact night-coverage [email or web address] Password [email or web address] 02/18/2014, 2:45 PM   LOS: 2 days

## 2014-02-18 NOTE — Progress Notes (Signed)
ANTICOAGULATION CONSULT NOTE - Follow up    Pharmacy Consult for Coumadin Indication: h/o afib  Allergies  Allergen Reactions  . Ace Inhibitors     Stopped by Dr. Burt Knack due to progressive renal failure  . Prednisone Other (See Comments)    Raised blood sugar    Patient Measurements: Height: 5\' 11"  (180.3 cm) Weight: 182 lb 12.8 oz (82.918 kg) IBW/kg (Calculated) : 75.3   Vital Signs: Temp: 97.9 F (36.6 C) (05/30 0519) Temp src: Oral (05/30 0519) BP: 146/70 mmHg (05/30 0519) Pulse Rate: 51 (05/30 0519)  Labs:  Recent Labs  02/16/14  02/16/14 2122 02/17/14 0702 02/18/14 0554  HGB  --   < > 14.2 12.9* 12.7*  HCT  --   --  42.1 38.6* 38.1*  PLT  --   --  121* 102* 112*  LABPROT  --   --  23.2*  --  19.5*  INR 1.9  --  2.14*  --  1.70*  CREATININE  --   < > 3.26* 3.33* 3.34*  < > = values in this interval not displayed.  Estimated Creatinine Clearance: 18.5 ml/min (by C-G formula based on Cr of 3.34).   Medical History: Past Medical History  Diagnosis Date  . Chronic atrial fibrillation     a. Historically difficult rate control. b. s/p CRT-D implantation with anticipation of AV junction ablation but patient then was able to accomplish rate control and ablation not undertaken.; AVN ablation 08/2013 by Dr Lovena Le  . CARDIOMYOPATHY, PRIMARY, DILATED     a. Mixed ICM/NICM - EF 15% by echo 05/2009; St[employer] Jude CRT-D implanted 06/2009. b. Echo 04/2013 - EF 20%, mild LVH.  [name] COLON CANCER, HX OF     a. s/p partial colectomy.  . Chronic systolic CHF (congestive heart failure)   . CORONARY ARTERY DISEASE     a. BMS to LAD 04/2008. b. Cath 06/2009: nonobstructive disease.  [name] PROSTATE CANCER, HX OF     a. s/p radical prostatectomy.  [name] SKIN CANCER, HX OF   . SLEEP APNEA   . Hypertension   . Hyperlipidemia   . Diabetes mellitus   . Osteoarthritis   . Tubular adenoma of colon   . Secondary hyperparathyroidism   . CKD (chronic kidney disease), stage IV     a. Right  brachiocephalic AV fistula 11/2200. Neph = Dunham.  . Vertigo   . Valvular heart disease     a. Echo 04/2013: mild AI, mild MR.  . Pulmonary HTN     a. PA pressure 50mmHg by echo 05/01/13.    Medications:  Prescriptions prior to admission  Medication Sig Dispense Refill  . aspirin 81 MG chewable tablet Chew 81 mg by mouth daily.      . calcitRIOL (ROCALTROL) 0.25 MCG capsule Take 1 capsule (0.25 mcg total) by mouth as directed. Taking only on Monday and Thursday      . digoxin (LANOXIN) 0.125 MG tablet Take 1 tablet (0.125 mg total) by mouth every other day.      . furosemide (LASIX) 80 MG tablet Take 1 tablet (80 mg total) by mouth 2 (two) times daily.  180 tablet  2  . hydrALAZINE (APRESOLINE) 25 MG tablet Take 1.5 tablets (37.5 mg total) by mouth 3 (three) times daily.  180 tablet  6  . insulin lispro protamine-lispro (HUMALOG 75/25 MIX) (75-25) 100 UNIT/ML SUSP injection Inject 45 Units into the skin 2 (two) times daily with a meal.      .  isosorbide mononitrate (IMDUR) 30 MG 24 hr tablet Take 0.5 tablets (15 mg total) by mouth daily.  90 tablet  3  . metoprolol succinate (TOPROL-XL) 100 MG 24 hr tablet Take 100 mg by mouth 2 (two) times daily. Take with or immediately following a meal.      . potassium chloride SA (K-DUR,KLOR-CON) 20 MEQ tablet Take 20 mEq by mouth 2 (two) times daily.       [name] warfarin (COUMADIN) 5 MG tablet Take 2.5-5 mg by mouth See admin instructions. On Monday and Friday take 2.5 mg; On Tuesday, Wednesday, Thursday, Saturday, and Sunday takes 5 mg.      [name] glucose blood (FREESTYLE TEST STRIPS) test strip 1 each by Other route as needed. Use as instructed       . nitroGLYCERIN (NITROSTAT) 0.4 MG SL tablet Place 0.4 mg under the tongue every 5 (five) minutes x 3 doses as needed for chest pain.       Scheduled:  . aspirin  81 mg Oral Daily  . [START ON 02/20/2014] calcitRIOL  0.25 mcg Oral Once per day on Mon Thu  . ceFEPime (MAXIPIME) IV  1 g Intravenous Q24H  . digoxin   0.125 mg Oral QODAY  . furosemide  80 mg Oral BID  . hydrALAZINE  37.5 mg Oral 3 times per day  . insulin aspart  0-9 Units Subcutaneous TID WC  . insulin aspart protamine- aspart  15 Units Subcutaneous BID WC  . isosorbide mononitrate  15 mg Oral Daily  . metoprolol succinate  100 mg Oral BID  . potassium chloride SA  20 mEq Oral BID  . sodium chloride  3 mL Intravenous Q12H  . [START ON 02/19/2014] vancomycin  1,000 mg Intravenous Q48H  . warfarin  2.5 mg Oral Once per day on Mon Fri   And  . warfarin  5 mg Oral Once per day on Sun Tue Wed Thu Sat  . Warfarin - Physician Dosing Inpatient   Does not apply q1800    Assessment: INR= decreased to 1.70 today in this 80 y.o male continuing on chronic coumadin PTA for h/o afib. Today Hgb is 12.7 stable and PLTC is up some today to 112 from 102 < 121K. . No bleeding noted. MD notes heart rate controlled.   He presented to ED on 5/28 PM with weakness, vomiting, diarrhea, associated with chest pain and shortness of breath.  He had an anticoagulation outpatient office visit on 02/16/14 in which the INR was 1.9.  Office notes indicate patient instructed to take an extra 1/2 tab on 5/28 only then continue 1 tablet (5 mg) daily except 1/2 tablet (=2.5 mg) on M/F.  On admission 5/28 PM his INR was 2.14.    PMH is as noted above.  He was started on IV vancomycin and cefepime for R/O PNA. SCR stable at  3.33,  h/o CKD.    Goal of Therapy:  INR 2-3 Monitor platelets by anticoagulation protocol: Yes   Plan:  Will give 7.5mg  coumadin today. Daily PT/INR.    Nicole Cella, RPh Clinical Pharmacist Pager: [phone] 02/18/2014,11:18 AM

## 2014-02-19 ENCOUNTER — Inpatient Hospital Stay (HOSPITAL_COMMUNITY): Payer: Medicare Other

## 2014-02-19 DIAGNOSIS — E1129 Type 2 diabetes mellitus with other diabetic kidney complication: Secondary | ICD-10-CM

## 2014-02-19 LAB — CBC
HEMATOCRIT: 39 % (ref 39.0–52.0)
Hemoglobin: 13.2 g/dL (ref 13.0–17.0)
MCH: 30.8 pg (ref 26.0–34.0)
MCHC: 33.8 g/dL (ref 30.0–36.0)
MCV: 91.1 fL (ref 78.0–100.0)
PLATELETS: 130 10*3/uL — AB (ref 150–400)
RBC: 4.28 MIL/uL (ref 4.22–5.81)
RDW: 14.4 % (ref 11.5–15.5)
WBC: 9.2 10*3/uL (ref 4.0–10.5)

## 2014-02-19 LAB — BASIC METABOLIC PANEL
BUN: 65 mg/dL — AB (ref 6–23)
CO2: 25 mEq/L (ref 19–32)
Calcium: 9.3 mg/dL (ref 8.4–10.5)
Chloride: 99 mEq/L (ref 96–112)
Creatinine, Ser: 3.42 mg/dL — ABNORMAL HIGH (ref 0.50–1.35)
GFR calc Af Amer: 18 mL/min — ABNORMAL LOW (ref >90)
GFR, EST NON AFRICAN AMERICAN: 15 mL/min — AB (ref >90)
Glucose, Bld: 151 mg/dL — ABNORMAL HIGH (ref 70–99)
Potassium: 3.4 mEq/L — ABNORMAL LOW (ref 3.7–5.3)
Sodium: 141 mEq/L (ref 137–147)

## 2014-02-19 LAB — GLUCOSE, CAPILLARY
GLUCOSE-CAPILLARY: 227 mg/dL — AB (ref 70–99)
GLUCOSE-CAPILLARY: 156 mg/dL — AB (ref 70–99)
GLUCOSE-CAPILLARY: 159 mg/dL — AB (ref 70–99)

## 2014-02-19 LAB — PROTIME-INR
INR: 1.82 — AB (ref 0.00–1.49)
Prothrombin Time: 20.5 seconds — ABNORMAL HIGH (ref 11.6–15.2)

## 2014-02-19 LAB — TROPONIN I: Troponin I: <0.3 ng/mL (ref <0.30)

## 2014-02-19 MED ORDER — LEVOFLOXACIN 500 MG PO TABS: 500.0000 mg | ORAL_TABLET | ORAL | Status: DC

## 2014-02-19 MED ORDER — HYDROCODONE-HOMATROPINE 5-1.5 MG/5ML PO SYRP: 5.0000 mL | ORAL_SOLUTION | ORAL | Status: DC | PRN

## 2014-02-19 MED ORDER — WARFARIN SODIUM 5 MG PO TABS
5.0000 mg | ORAL_TABLET | Freq: Once | ORAL | Status: AC
  Administered 2014-02-19: 5 mg via ORAL
  Filled 2014-02-19: qty 1

## 2014-02-19 MED ORDER — LEVOFLOXACIN 750 MG PO TABS: 750.0000 mg | ORAL_TABLET | ORAL | Status: DC

## 2014-02-19 MED ORDER — LEVOFLOXACIN 750 MG PO TABS
750.0000 mg | ORAL_TABLET | ORAL | Status: DC
  Administered 2014-02-19: 750 mg via ORAL
  Filled 2014-02-19: qty 1

## 2014-02-19 MED ORDER — INSULIN LISPRO PROT & LISPRO (75-25 MIX) 100 UNIT/ML ~~LOC~~ SUSP: 20.0000 [IU] | Freq: Two times a day (BID) | SUBCUTANEOUS | Status: DC

## 2014-02-19 NOTE — Evaluation (Signed)
Physical Therapy Evaluation Patient Details Name: [name] [name]. MRN: [id number] DOB: 05-02-1931 Today's Date: 02/19/2014   History of Present Illness   80 yo male with a history of CKD stage IV, chronic atrial fibrillation on anticoagulation, dilated cardiomyopathy, chronic systolic CHF, and diabetes mellitus presents with 2 days of worsening cough, had chills all day on the day of admission. The patient was also found to be lethargic by his family. He then vomited once and had one episode of diarrhea, both nonbloody. Has pleuritic chest pain on the lower left side when coughing a lot. In addition, the patient was found to have temperature 102.33F upon arrival. Patient was treated with intravenous antibiotics. He was empirically started on vancomycin and cefepime. The patient significantly improved and his mentation also improved. The patient's renal function remains stable. He was continued on his home dose of furosemide. He remained hemodynamically stable. Troponins were cycled and were negative. EKG did not show any ischemic changes.The patient complained of pleuritic chest pain. This is likely due to his pneumonia. He was worsened with coughing. The patient was placed on Hycodan. His cough and chest pain improved.   Clinical Impression  Pt at or near baseline with some mild to moderate balance deficits and interestingly, found to have right posterior canal BPPV.  Performed canalith repositioning and re-assessed with clearance noted.  Pt educated on post-maneuver care.  Plans to d/c home with daughter for a few days, then return to townhome where he lives alone.  Agreeable to HHPT to assess and progress balance and mobility.  No further needs acutely, will sign off.    Follow Up Recommendations Home health PT;Supervision - Intermittent;Supervision/Assistance - 24 hour    Equipment Recommendations  None recommended by PT    Recommendations for Other Services       Precautions /  Restrictions Precautions Precautions: Fall Restrictions Other Position/Activity Restrictions: avoid sleeping flat X24 hours after canalith repostioning       Mobility  Bed Mobility Overal bed mobility: Independent                Transfers Overall transfer level: Needs assistance Equipment used: None Transfers: Sit to/from Stand Sit to Stand: Supervision         General transfer comment: observed for safety as pt states he's weak from being bed x 3 days  Ambulation/Gait Ambulation/Gait assistance: Supervision Ambulation Distance (Feet): 200 Feet Assistive device: None Gait Pattern/deviations: Step-through pattern;Staggering left;Staggering right Gait velocity: 2.19 ft/sec Gait velocity interpretation: Below normal speed for age/gender General Gait Details: minimal foot flexibility so decr heel strike and occassional stagger to left/right off strait trajectory, pt able to self correct but admits to feeling a little unsteady  Stairs            Wheelchair Mobility    Modified Rankin (Stroke Patients Only)       Balance Overall balance assessment: Needs assistance Sitting-balance support: Feet supported;No upper extremity supported Sitting balance-Leahy Scale: Good     Standing balance support: No upper extremity supported;During functional activity Standing balance-Leahy Scale: Fair                   Standardized Balance Assessment Standardized Balance Assessment :  (vestibular screen finds RIGHT post canal BPPV, Eply cleared)           Pertinent Vitals/Pain no apparent distress     Home Living Family/patient expects to be discharged to:: Private residence Living Arrangements: Alone Available Help at Discharge: Family;Available 24  hours/day Type of Home: House Home Access: Stairs to enter Entrance Stairs-Rails: Can reach both Entrance Stairs-Number of Steps: 3 Home Layout: One level Home Equipment: Walker - 2 wheels;Shower seat;Cane  - single point Additional Comments: going to live with daughter for a few days after d/c but eventually returning home to 2 level townhome.  has yorkie named bandit    Prior Function Level of Independence: Independent         Comments: wife recently passed and pt "doesn't know which way is up"     Hand Dominance   Dominant Hand: Right    Extremity/Trunk Assessment   Upper Extremity Assessment: Overall WFL for tasks assessed           Lower Extremity Assessment: Overall WFL for tasks assessed         Communication   Communication: No difficulties  Cognition Arousal/Alertness: Awake/alert Behavior During Therapy: WFL for tasks assessed/performed Overall Cognitive Status: Within Functional Limits for tasks assessed                      General Comments General comments (skin integrity, edema, etc.): Pt educated on brandt-daroff exercises for vestibular control and able to demonstrate.      Exercises        Assessment/Plan    PT Assessment All further PT needs can be met in the next venue of care  PT Diagnosis Difficulty walking   PT Problem List Decreased balance;Decreased mobility  PT Treatment Interventions     PT Goals (Current goals can be found in the Care Plan section) Acute Rehab PT Goals Patient Stated Goal: go home, live independent, walk dog PT Goal Formulation: No goals set, d/c therapy    Frequency     Barriers to discharge        Co-evaluation               End of Session Equipment Utilized During Treatment: Gait belt Activity Tolerance: Patient tolerated treatment well Patient left: in bed;with call bell/phone within reach Nurse Communication: Mobility status         Time: 9688-6484 PT Time Calculation (min): 30 min   Charges:   PT Evaluation $Initial PT Evaluation Tier I: 1 Procedure PT Treatments $Gait Training: 8-22 mins $Canalith Rep Proc: 8-22 mins   PT G Codes:          Herbie Drape 02/19/2014, 5:21 PM

## 2014-02-19 NOTE — Progress Notes (Signed)
ANTICOAGULATION CONSULT NOTE - Follow up    Pharmacy Consult for Coumadin Indication: h/o afib  Allergies  Allergen Reactions  . Ace Inhibitors     Stopped by Dr. Burt Knack due to progressive renal failure  . Prednisone Other (See Comments)    Raised blood sugar    Patient Measurements: Height: 5\' 11"  (180.3 cm) Weight: 182 lb 12.8 oz (82.918 kg) IBW/kg (Calculated) : 75.3   Vital Signs: Temp: 98.6 F (37 C) (05/31 1338) Temp src: Oral (05/31 1338) BP: 142/51 mmHg (05/31 1338) Pulse Rate: 72 (05/31 1338)  Labs:  Recent Labs  02/16/14 2122 02/17/14 7322 02/18/14 0554 02/18/14 1525 02/18/14 2036 02/19/14 0453 02/19/14 0455  HGB 14.2 12.9* 12.7*  --   --  13.2  --   HCT 42.1 38.6* 38.1*  --   --  39.0  --   PLT 121* 102* 112*  --   --  130*  --   LABPROT 23.2*  --  19.5*  --   --  20.5*  --   INR 2.14*  --  1.70*  --   --  1.82*  --   CREATININE 3.26* 3.33* 3.34*  --   --  3.42*  --   TROPONINI  --   --   --  <0.30 <0.30  --  <0.30    Estimated Creatinine Clearance: 18 ml/min (by C-G formula based on Cr of 3.42).   Medical History: Past Medical History  Diagnosis Date  . Chronic atrial fibrillation     a. Historically difficult rate control. b. s/p CRT-D implantation with anticipation of AV junction ablation but patient then was able to accomplish rate control and ablation not undertaken.; AVN ablation 08/2013 by Dr Lovena Le  . CARDIOMYOPATHY, PRIMARY, DILATED     a. Mixed ICM/NICM - EF 15% by echo 05/2009; St. Jude CRT-D implanted 06/2009. b. Echo 04/2013 - EF 20%, mild LVH.  [name] COLON CANCER, HX OF     a. s/p partial colectomy.  . Chronic systolic CHF (congestive heart failure)   . CORONARY ARTERY DISEASE     a. BMS to LAD 04/2008. b. Cath 06/2009: nonobstructive disease.  [name] PROSTATE CANCER, HX OF     a. s/p radical prostatectomy.  [name] SKIN CANCER, HX OF   . SLEEP APNEA   . Hypertension   . Hyperlipidemia   . Diabetes mellitus   . Osteoarthritis   . Tubular  adenoma of colon   . Secondary hyperparathyroidism   . CKD (chronic kidney disease), stage IV     a. Right brachiocephalic AV fistula 0/2542. Neph = Dunham.  . Vertigo   . Valvular heart disease     a. Echo 04/2013: mild AI, mild MR.  . Pulmonary HTN     a. PA pressure 40mmHg by echo 05/01/13.    Medications:  Prescriptions prior to admission  Medication Sig Dispense Refill  . aspirin 81 MG chewable tablet Chew 81 mg by mouth daily.      . calcitRIOL (ROCALTROL) 0.25 MCG capsule Take 1 capsule (0.25 mcg total) by mouth as directed. Taking only on Monday and Thursday      . digoxin (LANOXIN) 0.125 MG tablet Take 1 tablet (0.125 mg total) by mouth every other day.      . furosemide (LASIX) 80 MG tablet Take 1 tablet (80 mg total) by mouth 2 (two) times daily.  180 tablet  2  . hydrALAZINE (APRESOLINE) 25 MG tablet Take 1.5 tablets (37.5 mg total) by  mouth 3 (three) times daily.  180 tablet  6  . isosorbide mononitrate (IMDUR) 30 MG 24 hr tablet Take 0.5 tablets (15 mg total) by mouth daily.  90 tablet  3  . metoprolol succinate (TOPROL-XL) 100 MG 24 hr tablet Take 100 mg by mouth 2 (two) times daily. Take with or immediately following a meal.      . potassium chloride SA (K-DUR,KLOR-CON) 20 MEQ tablet Take 20 mEq by mouth 2 (two) times daily.       [name] warfarin (COUMADIN) 5 MG tablet Take 2.5-5 mg by mouth See admin instructions. On Monday and Friday take 2.5 mg; On Tuesday, Wednesday, Thursday, Saturday, and Sunday takes 5 mg.      . [DISCONTINUED] insulin lispro protamine-lispro (HUMALOG 75/25 MIX) (75-25) 100 UNIT/ML SUSP injection Inject 45 Units into the skin 2 (two) times daily with a meal.      . glucose blood (FREESTYLE TEST STRIPS) test strip 1 each by Other route as needed. Use as instructed       . nitroGLYCERIN (NITROSTAT) 0.4 MG SL tablet Place 0.4 mg under the tongue every 5 (five) minutes x 3 doses as needed for chest pain.       Scheduled:  . aspirin  81 mg Oral Daily  . [START  ON 02/20/2014] calcitRIOL  0.25 mcg Oral Once per day on Mon Thu  . digoxin  0.125 mg Oral QODAY  . furosemide  80 mg Oral BID  . hydrALAZINE  37.5 mg Oral 3 times per day  . insulin aspart  0-9 Units Subcutaneous TID WC  . insulin aspart protamine- aspart  20 Units Subcutaneous BID WC  . isosorbide mononitrate  15 mg Oral Daily  . levofloxacin  750 mg Oral Q48H  . metoprolol succinate  100 mg Oral BID  . potassium chloride SA  20 mEq Oral BID  . sodium chloride  3 mL Intravenous Q12H  . Warfarin - Physician Dosing Inpatient   Does not apply q1800    Assessment: 80 y.o male continuing on chronic coumadin PTA for h/o afib. Patient for discharge today and need his dose tonight. INR today= 1.82. Noted coumadin 7.5mg  po given 5/30/  Coumadin home dose: tablet (5 mg) daily except 1/2 tablet (=2.5 mg) on M/F.   Goal of Therapy:  INR 2-3 Monitor platelets by anticoagulation protocol: Yes   Plan:  -Will give 5mg  coumadin today. -Anticipate d/c tonight  Hildred Laser, Pharm D 02/19/2014 5:46 PM

## 2014-02-19 NOTE — Discharge Instructions (Signed)

## 2014-02-20 LAB — GLUCOSE, CAPILLARY: Glucose-Capillary: 192 mg/dL — ABNORMAL HIGH (ref 70–99)

## 2014-02-21 ENCOUNTER — Ambulatory Visit (INDEPENDENT_AMBULATORY_CARE_PROVIDER_SITE_OTHER): Payer: Medicare Other | Admitting: Family

## 2014-02-21 ENCOUNTER — Telehealth: Payer: Self-pay | Admitting: Internal Medicine

## 2014-02-21 ENCOUNTER — Ambulatory Visit: Payer: Medicare Other | Admitting: Internal Medicine

## 2014-02-21 DIAGNOSIS — Z5181 Encounter for therapeutic drug level monitoring: Secondary | ICD-10-CM

## 2014-02-21 DIAGNOSIS — I4891 Unspecified atrial fibrillation: Secondary | ICD-10-CM

## 2014-02-21 DIAGNOSIS — I482 Chronic atrial fibrillation, unspecified: Secondary | ICD-10-CM

## 2014-02-21 LAB — POCT INR: INR: 4.6

## 2014-02-21 NOTE — Patient Instructions (Signed)
Hold Coumadin x 2 days. Then  2.5mg  (1/2 tab) on M/W/F all other days, take 5mg  (1 tab.  Re-check in 1 weeks.  Anticoagulation Dose Instructions as of 02/21/2014     Dorene Grebe Tue Wed Thu Fri Sat   New Dose 5 mg 2.5 mg 5 mg 2.5 mg 5 mg 2.5 mg 5 mg    Description       Hold Coumadin x 2 days. Then  2.5mg  (1/2 tab) on M/W/F all other days, take 5mg  (1 tab.  Re-check in 1 weeks.

## 2014-02-21 NOTE — Telephone Encounter (Signed)
Pt called to say that the hospital told him to have his protime checked today he would have had it checked with Dr Leanne Chang today but since Dr Leanne Chang is out he is requesting to have it done today he will see Dr Leanne Chang on tomorrow  he was discharged from the hospital  on Sunday 02/19/14

## 2014-02-21 NOTE — Telephone Encounter (Signed)
Per Abby Potash, it is ok for pt to have pt/inr checked during his visit with Dr. Leanne Chang tomorrow

## 2014-02-22 ENCOUNTER — Telehealth: Payer: Self-pay | Admitting: Internal Medicine

## 2014-02-22 ENCOUNTER — Ambulatory Visit (INDEPENDENT_AMBULATORY_CARE_PROVIDER_SITE_OTHER): Payer: Medicare Other | Admitting: Internal Medicine

## 2014-02-22 ENCOUNTER — Ambulatory Visit: Payer: Medicare Other | Admitting: Internal Medicine

## 2014-02-22 ENCOUNTER — Other Ambulatory Visit: Payer: Self-pay | Admitting: *Deleted

## 2014-02-22 ENCOUNTER — Encounter: Payer: Self-pay | Admitting: Internal Medicine

## 2014-02-22 VITALS — BP 160/80 | HR 70 | Temp 98.1°F | Resp 20 | Ht 71.0 in | Wt 183.0 lb

## 2014-02-22 DIAGNOSIS — E119 Type 2 diabetes mellitus without complications: Secondary | ICD-10-CM

## 2014-02-22 DIAGNOSIS — Z5181 Encounter for therapeutic drug level monitoring: Secondary | ICD-10-CM

## 2014-02-22 DIAGNOSIS — I10 Essential (primary) hypertension: Secondary | ICD-10-CM

## 2014-02-22 DIAGNOSIS — I509 Heart failure, unspecified: Secondary | ICD-10-CM

## 2014-02-22 DIAGNOSIS — J189 Pneumonia, unspecified organism: Secondary | ICD-10-CM

## 2014-02-22 DIAGNOSIS — I4891 Unspecified atrial fibrillation: Secondary | ICD-10-CM

## 2014-02-22 DIAGNOSIS — I5043 Acute on chronic combined systolic (congestive) and diastolic (congestive) heart failure: Secondary | ICD-10-CM

## 2014-02-22 DIAGNOSIS — I482 Chronic atrial fibrillation, unspecified: Secondary | ICD-10-CM

## 2014-02-22 MED ORDER — FREESTYLE LANCETS MISC: 1.0000 | Freq: Two times a day (BID) | Status: DC | PRN

## 2014-02-22 MED ORDER — GLUCOSE BLOOD VI STRP: 1.0000 | ORAL_STRIP | Freq: Two times a day (BID) | Status: DC | PRN

## 2014-02-22 NOTE — Patient Instructions (Signed)
Limit your sodium (Salt) intake  Follow up Dr. Leanne Chang next week as scheduled  Check blood sugars twice daily prior to breakfast and your evening meal  Continue present insulin dosing  Followup PT/INR next week

## 2014-02-22 NOTE — Progress Notes (Signed)
Pre-visit discussion using our clinic review tool. No additional management support is needed unless otherwise documented below in the visit note.  

## 2014-02-22 NOTE — Progress Notes (Signed)
Subjective:    Patient ID: [name] [name]., male    DOB: 1931-07-09, 80 y.o.   MRN: [id number]  HPI  Admit date: 02/16/2014  Discharge date:02/19/14   Recommendations for Outpatient Follow-up:  1. Pt will need to follow up with PCP --pt has appt on 02/21/14 2. Please obtain BMP on 02/21/14 3. Please also check INR on 02/21/14 as adjust coumadin accordingly 4.  Discharge Diagnoses:  Healthcare associated pneumonia  -Continue vancomycin and cefepime--patient received nearly 3 days  -start hycodan for cough  -incentive spirometry  -The patient will discharge home on levofloxacin 750 mg every 48 hours with his next dose on 02/21/2014; his final dose will be on 02/23/2014 which will complete 7 days of therapy  CKD stage IV  -Baseline creatinine 3.2-3.6  -Continue furosemide  -Monitor BMP--serum creatinine 3.42 on the day of discharge  Atypical Chest pain  -likely related to pneumonia--worse with cough  -hycodan as discussed which helped with cough and CP  -EKG-RBBB unchanged  -cycle troponins--neg x 3  -repeat CXR on day of d/c showed improving but persistent lingular infiltrate without any other acute abnormalities  Diabetes mellitus type 2, with renal complications  -d/c lantus, start 70/30  -70/30, 20 units bid while in hospital--adjust as needed based on CBGs  -02/16/2014 hemoglobin A1c 7.9  -NovoLog sliding scale  -CBGs were well controlled during the hospitalization on half of his home dose  -Patient indicated that he was having several low blood sugars at home as he was not eating nearly as well as he use to  -Plan to discharge on 70/25, 20 units twice a day  -he will go home on his usual home dose of insulin and check CBGs 4X/day and keep a glycemic log with which he will take to his PCP to adjust his insulin regimen  Chronic atrial fibrillation  -Rate controlled  -Continue warfarin  -Continue metoprolol succinate as the patient will be placed on levofloxacin, the patient  needs to have his INR monitored more frequent until he is finished with his antibiotics  - he needs to have his INR checked on 02/21/2014  Ischemic cardiomyopathy  -EF 20-25%--status post ICD  -Continue aspirin  -Continue metoprolol succinate, hydralazine, Imdur  Hypertension  -Continue antihypertensive regimen as discussed above  Family Communication: Pt at beside  Disposition Plan: Home when medically stable  Discharge Condition: stable  Disposition: home  80 year old patient seen today following a recent hospital discharge for HCAP.  She remains a bit weak with anorexia, but in general, doing quite well.  He finishes his final day of Levaquin tomorrow.  No cough or shortness of breath.  He did have a followup INR yesterday that was super therapeutic and Coumadin is on hold.  He is scheduled for followup next week with his PCP. He was discharged on 75/25 insulin 20 units twice daily, but no home blood sugar monitoring.  A glucometer with test strips dispensed today.  He is eating smaller more frequent meals with between the meal snacks.  No recurrent fever or sputum production. He has chronic kidney disease and is followed closely by nephrology  Social history.  Lost his wife approximately 2 months ago  Review of Systems  Constitutional: Positive for activity change, appetite change and fatigue. Negative for fever and chills.  HENT: Negative for congestion, dental problem, ear pain, hearing loss, sore throat, tinnitus, trouble swallowing and voice change.   Eyes: Negative for pain, discharge and visual disturbance.  Respiratory: Negative for  cough, chest tightness, wheezing and stridor.   Cardiovascular: Negative for chest pain, palpitations and leg swelling.  Gastrointestinal: Negative for nausea, vomiting, abdominal pain, diarrhea, constipation, blood in stool and abdominal distention.  Genitourinary: Negative for urgency, hematuria, flank pain, discharge, difficulty urinating and  genital sores.  Musculoskeletal: Negative for arthralgias, back pain, gait problem, joint swelling, myalgias and neck stiffness.  Skin: Negative for rash.  Neurological: Positive for weakness. Negative for dizziness, syncope, speech difficulty, numbness and headaches.  Hematological: Negative for adenopathy. Does not bruise/bleed easily.  Psychiatric/Behavioral: Negative for behavioral problems and dysphoric mood. The patient is not nervous/anxious.        Objective:   Physical Exam  Constitutional: He is oriented to person, place, and time. He appears well-developed.  Blood pressure 148/80 Afebrile  HENT:  Head: Normocephalic.  Right Ear: External ear normal.  Left Ear: External ear normal.  Eyes: Conjunctivae and EOM are normal.  Neck: Normal range of motion.  Cardiovascular: Normal rate and normal heart sounds.   Pulmonary/Chest: Effort normal and breath sounds normal. No respiratory distress. He has no wheezes. He has no rales.  Abdominal: Bowel sounds are normal.  Musculoskeletal: Normal range of motion. He exhibits no edema and no tenderness.  Fistula right arm  Neurological: He is alert and oriented to person, place, and time.  Psychiatric: He has a normal mood and affect. His behavior is normal.          Assessment & Plan:   Status post healthcare associated pneumonia.  Will complete his final dose of Levaquin tomorrow.  Followup PCP next week as scheduled Diabetes mellitus.  Continue 20 units of 75/25 insulin.  Home blood sugar monitor and encouraged.  A glucometer with test strips dispensed.  He has been encouraged to keep a log and to followup with his PCP next week Hypertension reasonable control Chronic kidney disease.  Followup nephrology

## 2014-02-22 NOTE — Telephone Encounter (Signed)
Relevant patient education assigned to patient using Emmi. ° °

## 2014-02-23 LAB — CULTURE, BLOOD (ROUTINE X 2)
Culture: NO GROWTH
Culture: NO GROWTH

## 2014-02-28 ENCOUNTER — Encounter: Payer: Self-pay | Admitting: Internal Medicine

## 2014-02-28 ENCOUNTER — Ambulatory Visit (INDEPENDENT_AMBULATORY_CARE_PROVIDER_SITE_OTHER): Payer: Medicare Other | Admitting: Internal Medicine

## 2014-02-28 ENCOUNTER — Ambulatory Visit (INDEPENDENT_AMBULATORY_CARE_PROVIDER_SITE_OTHER): Payer: Medicare Other | Admitting: Family

## 2014-02-28 VITALS — BP 140/74 | HR 70 | Temp 98.6°F | Ht 71.0 in | Wt 180.0 lb

## 2014-02-28 DIAGNOSIS — I2510 Atherosclerotic heart disease of native coronary artery without angina pectoris: Secondary | ICD-10-CM

## 2014-02-28 DIAGNOSIS — Z5181 Encounter for therapeutic drug level monitoring: Secondary | ICD-10-CM

## 2014-02-28 DIAGNOSIS — I4891 Unspecified atrial fibrillation: Secondary | ICD-10-CM

## 2014-02-28 DIAGNOSIS — I482 Chronic atrial fibrillation, unspecified: Secondary | ICD-10-CM

## 2014-02-28 DIAGNOSIS — J189 Pneumonia, unspecified organism: Secondary | ICD-10-CM

## 2014-02-28 LAB — POCT INR: INR: 1.5

## 2014-02-28 NOTE — Progress Notes (Signed)
Hospitalized with pneumonia- he is feeling much better. He appreciated the excellent care by Dr. Carles Collet.  ESRD- he is seeing dr. Lorrene Reid and had labs done at her office.   CHF- no real sxs  Appetite is generally poor- since hospitalization. His wife died- Eating alone is tough for him  Mood- doing really quite well. Travelling to [place] later this week.  DM- last a1c 7.6. He has cut insulin dosing because appetite and eating habits are eratic.  He would like to take cbg bid.   Past Medical History  Diagnosis Date  . Chronic atrial fibrillation     a. Historically difficult rate control. b. s/p CRT-D implantation with anticipation of AV junction ablation but patient then was able to accomplish rate control and ablation not undertaken.; AVN ablation 08/2013 by Dr Lovena Le  . CARDIOMYOPATHY, PRIMARY, DILATED     a. Mixed ICM/NICM - EF 15% by echo 05/2009; St[employer] Jude CRT-D implanted 06/2009. b. Echo 04/2013 - EF 20%, mild LVH.  [name] COLON CANCER, HX OF     a. s/p partial colectomy.  . Chronic systolic CHF (congestive heart failure)   . CORONARY ARTERY DISEASE     a. BMS to LAD 04/2008. b. Cath 06/2009: nonobstructive disease.  [name] PROSTATE CANCER, HX OF     a. s/p radical prostatectomy.  [name] SKIN CANCER, HX OF   . SLEEP APNEA   . Hypertension   . Hyperlipidemia   . Diabetes mellitus   . Osteoarthritis   . Tubular adenoma of colon   . Secondary hyperparathyroidism   . CKD (chronic kidney disease), stage IV     a. Right brachiocephalic AV fistula 11/2917. Neph = Dunham.  . Vertigo   . Valvular heart disease     a. Echo 04/2013: mild AI, mild MR.  . Pulmonary HTN     a. PA pressure 68mmHg by echo 05/01/13.    History   Social History  . Marital Status: Widowed    Spouse Name: N/A    Number of Children: 4  . Years of Education: N/A   Occupational History  . retired    Social History Main Topics  . Smoking status: Former Smoker    Types: Cigarettes    Quit date: 09/22/1966  . Smokeless  tobacco: Never Used  . Alcohol Use: 0.0 - 0.5 oz/week    0-1 drink(s) per week     Comment: occasional  . Drug Use: No  . Sexual Activity: No   Other Topics Concern  . Not on file   Social History Narrative  . No narrative on file    Past Surgical History  Procedure Laterality Date  . Colectomy    . Prostatectomy    . Penile prosthesis placement    . Removed prosthetic eye    . Ptca      stent placed  . Total knee arthroplasty  2008    right  . Pacemaker insertion  2010  . Av fistula placement  03/30/2012    Procedure: ARTERIOVENOUS (AV) FISTULA CREATION;  Surgeon: Angelia Mould, MD;  Location: [facility];  Service: Vascular;  Laterality: Right;  Creation of BrachioCephalic Fistula Right arm  . Eye surgery      left eye prothesis  . Fracture surgery      collar bone, right knee replacement  . Bi-ventricular implantable cardioverter defibrillator  (crt-d)  2010    STJ CRTD implanted by Dr [name]  . Ablation  08/2013    AVN ablation  by Dr Lovena Le    Family History  Problem Relation Age of Onset  . Heart disease Father   . Throat cancer Father   . Throat cancer Mother   . Heart disease Mother   . Hypertension Mother     Allergies  Allergen Reactions  . Ace Inhibitors     Stopped by Dr. Burt Knack due to progressive renal failure  . Prednisone Other (See Comments)    Raised blood sugar    Current Outpatient Prescriptions on File Prior to Visit  Medication Sig Dispense Refill  . aspirin 81 MG chewable tablet Chew 81 mg by mouth daily.      . digoxin (LANOXIN) 0.125 MG tablet Take 1 tablet (0.125 mg total) by mouth every other day.      . furosemide (LASIX) 80 MG tablet Take 1 tablet (80 mg total) by mouth 2 (two) times daily.  180 tablet  2  . glucose blood (FREESTYLE LITE) test strip 1 each by Other route 2 (two) times daily as needed for other.  100 each  12  . hydrALAZINE (APRESOLINE) 25 MG tablet Take 1.5 tablets (37.5 mg total) by mouth 3 (three) times daily.   180 tablet  6  . HYDROcodone-homatropine (HYCODAN) 5-1.5 MG/5ML syrup Take 5 mLs by mouth every 4 (four) hours as needed for cough.  120 mL  0  . insulin lispro protamine-lispro (HUMALOG 75/25 MIX) (75-25) 100 UNIT/ML SUSP injection Inject 20 Units into the skin 2 (two) times daily with a meal.  10 mL  11  . isosorbide mononitrate (IMDUR) 30 MG 24 hr tablet Take 0.5 tablets (15 mg total) by mouth daily.  90 tablet  3  . Lancets (FREESTYLE) lancets 1 each by Other route 2 (two) times daily as needed for other.  100 each  12  . levofloxacin (LEVAQUIN) 500 MG tablet Take 1 tablet (500 mg total) by mouth every other day. Start 02/21/14  2 tablet  0  . metoprolol succinate (TOPROL-XL) 100 MG 24 hr tablet Take 100 mg by mouth 2 (two) times daily. Take with or immediately following a meal.      . nitroGLYCERIN (NITROSTAT) 0.4 MG SL tablet Place 0.4 mg under the tongue every 5 (five) minutes x 3 doses as needed for chest pain.      . potassium chloride SA (K-DUR,KLOR-CON) 20 MEQ tablet Take 20 mEq by mouth 2 (two) times daily.       [name] warfarin (COUMADIN) 5 MG tablet Take 2.5-5 mg by mouth See admin instructions. On Monday and Friday take 2.5 mg; On Tuesday, Wednesday, Thursday, Saturday, and Sunday takes 5 mg.       No current facility-administered medications on file prior to visit.     patient denies chest pain, shortness of breath, orthopnea. Denies lower extremity edema, abdominal pain, change in appetite, change in bowel movements. Patient denies rashes, musculoskeletal complaints. No other specific complaints in a complete review of systems.   BP 140/74  Pulse 70  Temp(Src) 98.6 F (37 C) (Oral)  Ht 5\' 11"  (1.803 m)  Wt 180 lb (81.647 kg)  BMI 25.12 kg/m2  well-developed well-nourished male in no acute distress. HEENT exam atraumatic, normocephalic, neck supple without jugular venous distention. Chest clear to auscultation cardiac exam S1-S2 are regular. Abdominal exam overweight with bowel  sounds, soft and nontender. Extremities no edema. Neurologic exam is alert with a normal gait.

## 2014-02-28 NOTE — Patient Instructions (Signed)
Then  2.5mg  (1/2 tab) on M/F all other days, take 5mg  1 tab.  Re-check in 3 weeks.  Anticoagulation Dose Instructions as of 02/28/2014     [name] [name] Tue Wed Thu Fri Sat   New Dose 5 mg 2.5 mg 5 mg 5 mg 5 mg 2.5 mg 5 mg    Description        Then  2.5mg  (1/2 tab) on M/F all other days, take 5mg  1 tab.  Re-check in 3 weeks.

## 2014-02-28 NOTE — Progress Notes (Signed)
Pre visit review using our clinic review tool, if applicable. No additional management support is needed unless otherwise documented below in the visit note. 

## 2014-03-02 ENCOUNTER — Other Ambulatory Visit: Payer: Self-pay | Admitting: *Deleted

## 2014-03-02 MED ORDER — GLUCOSE BLOOD VI STRP: 1.0000 | ORAL_STRIP | Freq: Two times a day (BID) | Status: DC | PRN

## 2014-03-02 NOTE — Assessment & Plan Note (Signed)
Clinically resolved Reviewed labs and imaging from hospital He has already had f/u with nephrology  See me 4 weeks

## 2014-03-03 ENCOUNTER — Ambulatory Visit: Payer: Medicare Other

## 2014-03-03 DIAGNOSIS — I482 Chronic atrial fibrillation, unspecified: Secondary | ICD-10-CM

## 2014-03-03 DIAGNOSIS — Z5181 Encounter for therapeutic drug level monitoring: Secondary | ICD-10-CM

## 2014-03-03 LAB — POCT INR: INR: 1.8

## 2014-03-03 NOTE — Patient Instructions (Signed)
[name] [name] asked Dr. About INR and informed patient on what dose.

## 2014-03-16 ENCOUNTER — Ambulatory Visit: Payer: Medicare Other

## 2014-03-20 ENCOUNTER — Ambulatory Visit: Payer: Medicare Other

## 2014-03-21 ENCOUNTER — Ambulatory Visit (INDEPENDENT_AMBULATORY_CARE_PROVIDER_SITE_OTHER): Payer: Medicare Other | Admitting: Cardiovascular Disease

## 2014-03-21 ENCOUNTER — Ambulatory Visit (INDEPENDENT_AMBULATORY_CARE_PROVIDER_SITE_OTHER): Payer: Medicare Other | Admitting: Family

## 2014-03-21 ENCOUNTER — Telehealth: Payer: Self-pay | Admitting: Cardiology

## 2014-03-21 ENCOUNTER — Encounter: Payer: Self-pay | Admitting: Internal Medicine

## 2014-03-21 ENCOUNTER — Ambulatory Visit (INDEPENDENT_AMBULATORY_CARE_PROVIDER_SITE_OTHER): Payer: Medicare Other | Admitting: *Deleted

## 2014-03-21 ENCOUNTER — Encounter: Payer: Self-pay | Admitting: Cardiovascular Disease

## 2014-03-21 VITALS — BP 163/72 | HR 70 | Ht 71.0 in | Wt 184.8 lb

## 2014-03-21 DIAGNOSIS — I4819 Other persistent atrial fibrillation: Secondary | ICD-10-CM

## 2014-03-21 DIAGNOSIS — Z5181 Encounter for therapeutic drug level monitoring: Secondary | ICD-10-CM

## 2014-03-21 DIAGNOSIS — I5022 Chronic systolic (congestive) heart failure: Secondary | ICD-10-CM

## 2014-03-21 DIAGNOSIS — I4891 Unspecified atrial fibrillation: Secondary | ICD-10-CM

## 2014-03-21 DIAGNOSIS — I509 Heart failure, unspecified: Secondary | ICD-10-CM

## 2014-03-21 DIAGNOSIS — I2510 Atherosclerotic heart disease of native coronary artery without angina pectoris: Secondary | ICD-10-CM

## 2014-03-21 DIAGNOSIS — I482 Chronic atrial fibrillation, unspecified: Secondary | ICD-10-CM

## 2014-03-21 LAB — POCT INR: INR: 1.6

## 2014-03-21 MED ORDER — HYDRALAZINE HCL 25 MG PO TABS: 37.5000 mg | ORAL_TABLET | Freq: Three times a day (TID) | ORAL | Status: DC

## 2014-03-21 NOTE — Telephone Encounter (Signed)
LMOVM reminding pt to send remote transmission.   

## 2014-03-21 NOTE — Progress Notes (Signed)
Remote ICD transmission.   

## 2014-03-21 NOTE — Patient Instructions (Signed)
Take an extra 1/2 tab today only.   Then  2.5mg  (1/2 tab) on Fridays only all other days, take 5mg  1 tab.  Re-check in 3 weeks.  Anticoagulation Dose Instructions as of 03/21/2014     [name] [name] Tue Wed Thu Fri Sat   New Dose 5 mg 5 mg 5 mg 5 mg 5 mg 2.5 mg 5 mg    Description       Take an extra 1/2 tab today only.   Then  2.5mg  (1/2 tab) on Fridays only all other days, take 5mg  1 tab.  Re-check in 3 weeks.

## 2014-03-21 NOTE — Patient Instructions (Signed)
Your physician wants you to follow-up in: 1 YEAR with Dr Cooper.  You will receive a reminder letter in the mail two months in advance. If you don't receive a letter, please call our office to schedule the follow-up appointment.  Your physician recommends that you continue on your current medications as directed. Please refer to the Current Medication list given to you today.  

## 2014-03-22 ENCOUNTER — Encounter: Payer: Self-pay | Admitting: Cardiovascular Disease

## 2014-03-22 NOTE — Progress Notes (Signed)
HPI:  80 year-old male presenting for follow-up evaluation. He's followed for chronic systolic heart failure, atrial fibrillation, Stage 4 CKD, and CAD (LAD stent 2009). His cardiomyopathy has been considered out-of-proportion to the extent of his CAD (NICM). He underwent AVN ablation December 2014. He has undergone CRT-D.   The patient has been hospitalized with pneumonia. He feels like he has recovered from this. His wife passed away within the past year and this is been difficult for him. His children live in [place].  From a cardiac perspective, he reports no interval symptoms. He denies chest pain or pressure. He denies orthopnea, PND, or leg swelling. He has mild dyspnea with exertion but no major change in symptoms.   Outpatient Encounter Prescriptions as of 03/21/2014  Medication Sig  . aspirin 81 MG chewable tablet Chew 81 mg by mouth daily.  . calcitRIOL (ROCALTROL) 0.25 MCG capsule Take 0.25 mcg by mouth 3 (three) times a week.  . digoxin (LANOXIN) 0.125 MG tablet Take 1 tablet (0.125 mg total) by mouth every other day.  . furosemide (LASIX) 80 MG tablet Take 1 tablet (80 mg total) by mouth 2 (two) times daily.  [name] glucose blood (FREESTYLE LITE) test strip 1 each by Other route 2 (two) times daily as needed for other. Dx: 250.00  . hydrALAZINE (APRESOLINE) 25 MG tablet Take 1.5 tablets (37.5 mg total) by mouth 3 (three) times daily.  . insulin lispro protamine-lispro (HUMALOG 75/25 MIX) (75-25) 100 UNIT/ML SUSP injection Inject 20 Units into the skin 2 (two) times daily with a meal.  . isosorbide mononitrate (IMDUR) 30 MG 24 hr tablet Take 0.5 tablets (15 mg total) by mouth daily.  . Lancets (FREESTYLE) lancets 1 each by Other route 2 (two) times daily as needed for other.  . metoprolol succinate (TOPROL-XL) 100 MG 24 hr tablet Take 100 mg by mouth 2 (two) times daily. Take with or immediately following a meal.  . nitroGLYCERIN (NITROSTAT) 0.4 MG SL tablet Place 0.4 mg under the  tongue every 5 (five) minutes x 3 doses as needed for chest pain.  . potassium chloride SA (K-DUR,KLOR-CON) 20 MEQ tablet Take 20 mEq by mouth 2 (two) times daily.   [name] warfarin (COUMADIN) 5 MG tablet Take 2.5-5 mg by mouth See admin instructions. On Monday and Friday take 2.5 mg; On Tuesday, Wednesday, Thursday, Saturday, and Sunday takes 5 mg.  . [DISCONTINUED] hydrALAZINE (APRESOLINE) 25 MG tablet Take 1.5 tablets (37.5 mg total) by mouth 3 (three) times daily.  . [DISCONTINUED] HYDROcodone-homatropine (HYCODAN) 5-1.5 MG/5ML syrup Take 5 mLs by mouth every 4 (four) hours as needed for cough.    Allergies  Allergen Reactions  . Ace Inhibitors     Stopped by Dr. Burt Knack due to progressive renal failure  . Prednisone Other (See Comments)    Raised blood sugar    Past Medical History  Diagnosis Date  . Chronic atrial fibrillation     a. Historically difficult rate control. b. s/p CRT-D implantation with anticipation of AV junction ablation but patient then was able to accomplish rate control and ablation not undertaken.; AVN ablation 08/2013 by Dr Lovena Le  . CARDIOMYOPATHY, PRIMARY, DILATED     a. Mixed ICM/NICM - EF 15% by echo 05/2009; St. Jude CRT-D implanted 06/2009. b. Echo 04/2013 - EF 20%, mild LVH.  [name] COLON CANCER, HX OF     a. s/p partial colectomy.  . Chronic systolic CHF (congestive heart failure)   . CORONARY ARTERY DISEASE  a. BMS to LAD 04/2008. b. Cath 06/2009: nonobstructive disease.  [name] PROSTATE CANCER, HX OF     a. s/p radical prostatectomy.  [name] SKIN CANCER, HX OF   . SLEEP APNEA   . Hypertension   . Hyperlipidemia   . Diabetes mellitus   . Osteoarthritis   . Tubular adenoma of colon   . Secondary hyperparathyroidism   . CKD (chronic kidney disease), stage IV     a. Right brachiocephalic AV fistula 04/4664. Neph = Dunham.  . Vertigo   . Valvular heart disease     a. Echo 04/2013: mild AI, mild MR.  . Pulmonary HTN     a. PA pressure 35mmHg by echo 05/01/13.     ROS: Negative except as per HPI  BP 163/72  Pulse 70  Ht 5\' 11"  (1.803 m)  Wt 83.825 kg (184 lb 12.8 oz)  BMI 25.79 kg/m2  PHYSICAL EXAM: Pt is alert and oriented, elderly male in NAD HEENT: normal Neck: JVP - normal, carotids 2+= without bruits Lungs: CTA bilaterally CV: RRR with 2/6 systolic murmur at the apex Abd: soft, NT, Positive BS, no hepatomegaly Ext: no C/C/E, distal pulses intact and equal Skin: warm/dry no rash  ASSESSMENT AND PLAN: 1. Chronic systolic heart failure, New York Heart Association class 2-3 symptoms. The patient is status post CRT-D. he is managed with metoprolol succinate, isosorbide, hydralazine, and furosemide. The patient has been on long-standing digoxin every other day. He is not a candidate for ACE or ARB because of advanced kidney disease. Overall I think he is clinically stable and will plan on seeing him back next year in followup.  2. Permanent atrial fibrillation. The patient is status post AV node ablation. He is anticoagulated with warfarin. Followed by Dr. [name].  Sherren Mocha 03/22/2014 6:21 AM

## 2014-03-23 LAB — MDC_IDC_ENUM_SESS_TYPE_REMOTE
Battery Voltage: 2.92 V
Date Time Interrogation Session: 20150630183308
HIGH POWER IMPEDANCE MEASURED VALUE: 44 Ohm
Implantable Pulse Generator Serial Number: 595859
Lead Channel Impedance Value: 400 Ohm
Lead Channel Pacing Threshold Amplitude: 0.75 V
Lead Channel Pacing Threshold Pulse Width: 0.6 ms
Lead Channel Pacing Threshold Pulse Width: 1 ms
Lead Channel Sensing Intrinsic Amplitude: 12 mV
Lead Channel Setting Pacing Amplitude: 2.5 V
Lead Channel Setting Sensing Sensitivity: 0.5 mV
MDC IDC MSMT BATTERY REMAINING LONGEVITY: 38 mo
MDC IDC MSMT BATTERY REMAINING PERCENTAGE: 49 %
MDC IDC MSMT LEADCHNL LV IMPEDANCE VALUE: 430 Ohm
MDC IDC MSMT LEADCHNL LV PACING THRESHOLD AMPLITUDE: 0.625 V
MDC IDC SET LEADCHNL LV PACING AMPLITUDE: 2 V
MDC IDC SET LEADCHNL LV PACING PULSEWIDTH: 1 ms
MDC IDC SET LEADCHNL RV PACING PULSEWIDTH: 0.6 ms
Zone Setting Detection Interval: 250 ms
Zone Setting Detection Interval: 300 ms

## 2014-03-30 ENCOUNTER — Telehealth: Payer: Self-pay | Admitting: Internal Medicine

## 2014-03-30 MED ORDER — POTASSIUM CHLORIDE CRYS ER 20 MEQ PO TBCR: 20.0000 meq | EXTENDED_RELEASE_TABLET | Freq: Two times a day (BID) | ORAL | Status: DC

## 2014-03-30 MED ORDER — DIGOXIN 125 MCG PO TABS: 0.1250 mg | ORAL_TABLET | ORAL | Status: DC

## 2014-03-30 MED ORDER — ISOSORBIDE MONONITRATE ER 30 MG PO TB24: 15.0000 mg | ORAL_TABLET | Freq: Every day | ORAL | Status: DC

## 2014-03-30 MED ORDER — METOPROLOL SUCCINATE ER 100 MG PO TB24: 100.0000 mg | ORAL_TABLET | Freq: Two times a day (BID) | ORAL | Status: DC

## 2014-03-30 NOTE — Telephone Encounter (Signed)
[facility] called requesting 90 day refills for  metoprolol succinate (TOPROL-XL) 100 MG 24 hr tablet potassium chloride SA (K-DUR,KLOR-CON) 20 MEQ tablet digoxin (LANOXIN) 0.125 MG tablet Pt requesting asap

## 2014-03-30 NOTE — Telephone Encounter (Signed)
Pt also needs isosorbide mononitrate 0.5 mg #90

## 2014-03-30 NOTE — Telephone Encounter (Signed)
rx sent in electronically 

## 2014-04-05 ENCOUNTER — Encounter: Payer: Self-pay | Admitting: Cardiology

## 2014-04-06 NOTE — Telephone Encounter (Signed)
Close Encounter 

## 2014-04-11 ENCOUNTER — Ambulatory Visit (INDEPENDENT_AMBULATORY_CARE_PROVIDER_SITE_OTHER): Payer: Medicare Other | Admitting: Family

## 2014-04-11 DIAGNOSIS — I4891 Unspecified atrial fibrillation: Secondary | ICD-10-CM

## 2014-04-11 DIAGNOSIS — Z5181 Encounter for therapeutic drug level monitoring: Secondary | ICD-10-CM

## 2014-04-11 DIAGNOSIS — I482 Chronic atrial fibrillation, unspecified: Secondary | ICD-10-CM

## 2014-04-11 LAB — POCT INR: INR: 2.3

## 2014-04-11 NOTE — Patient Instructions (Signed)
Continue 2.5mg  (1/2 tab) on Fridays only all other days, take 5mg  1 tab.  Re-check in 4 weeks.  Anticoagulation Dose Instructions as of 04/11/2014     Dorene Grebe Tue Wed Thu Fri Sat   New Dose 5 mg 5 mg 5 mg 5 mg 5 mg 2.5 mg 5 mg    Description       Continue 2.5mg  (1/2 tab) on Fridays only all other days, take 5mg  1 tab.  Re-check in 4 weeks.

## 2014-05-02 ENCOUNTER — Telehealth: Payer: Self-pay | Admitting: Internal Medicine

## 2014-05-02 NOTE — Telephone Encounter (Signed)
Ok with me but I may not be back to even my part time schedule for a while

## 2014-05-02 NOTE — Telephone Encounter (Signed)
ok 

## 2014-05-02 NOTE — Telephone Encounter (Signed)
Pt requesting to established with an experience provider at [facility], pt declined to see Julianne Handler or Berline Lopes.  Would like to establish with either Burnice Logan or Shawna Orleans.  Please advise if ok.

## 2014-05-03 NOTE — Telephone Encounter (Signed)
Appt scheduled 9/30 with Dr. Raliegh Ip, pt aware.

## 2014-05-07 ENCOUNTER — Encounter (HOSPITAL_BASED_OUTPATIENT_CLINIC_OR_DEPARTMENT_OTHER): Payer: Self-pay | Admitting: Emergency Medicine

## 2014-05-07 ENCOUNTER — Emergency Department (HOSPITAL_BASED_OUTPATIENT_CLINIC_OR_DEPARTMENT_OTHER)
Admission: EM | Admit: 2014-05-07 | Discharge: 2014-05-07 | Disposition: A | Discharge status: Home / Self Care | Payer: Medicare Other | Attending: Emergency Medicine | Admitting: Emergency Medicine

## 2014-05-07 DIAGNOSIS — Z85828 Personal history of other malignant neoplasm of skin: Secondary | ICD-10-CM | POA: Insufficient documentation

## 2014-05-07 DIAGNOSIS — I4891 Unspecified atrial fibrillation: Secondary | ICD-10-CM | POA: Insufficient documentation

## 2014-05-07 DIAGNOSIS — I129 Hypertensive chronic kidney disease with stage 1 through stage 4 chronic kidney disease, or unspecified chronic kidney disease: Secondary | ICD-10-CM | POA: Diagnosis not present

## 2014-05-07 DIAGNOSIS — I2510 Atherosclerotic heart disease of native coronary artery without angina pectoris: Secondary | ICD-10-CM | POA: Insufficient documentation

## 2014-05-07 DIAGNOSIS — N184 Chronic kidney disease, stage 4 (severe): Secondary | ICD-10-CM | POA: Diagnosis not present

## 2014-05-07 DIAGNOSIS — Z8546 Personal history of malignant neoplasm of prostate: Secondary | ICD-10-CM | POA: Diagnosis not present

## 2014-05-07 DIAGNOSIS — R42 Dizziness and giddiness: Secondary | ICD-10-CM | POA: Insufficient documentation

## 2014-05-07 DIAGNOSIS — Z7901 Long term (current) use of anticoagulants: Secondary | ICD-10-CM | POA: Diagnosis not present

## 2014-05-07 DIAGNOSIS — Z85038 Personal history of other malignant neoplasm of large intestine: Secondary | ICD-10-CM | POA: Insufficient documentation

## 2014-05-07 DIAGNOSIS — Z8719 Personal history of other diseases of the digestive system: Secondary | ICD-10-CM | POA: Insufficient documentation

## 2014-05-07 DIAGNOSIS — Z7982 Long term (current) use of aspirin: Secondary | ICD-10-CM | POA: Diagnosis not present

## 2014-05-07 DIAGNOSIS — Z79899 Other long term (current) drug therapy: Secondary | ICD-10-CM | POA: Diagnosis not present

## 2014-05-07 DIAGNOSIS — Z794 Long term (current) use of insulin: Secondary | ICD-10-CM | POA: Insufficient documentation

## 2014-05-07 DIAGNOSIS — I5022 Chronic systolic (congestive) heart failure: Secondary | ICD-10-CM | POA: Diagnosis not present

## 2014-05-07 DIAGNOSIS — Z87891 Personal history of nicotine dependence: Secondary | ICD-10-CM | POA: Diagnosis not present

## 2014-05-07 DIAGNOSIS — E119 Type 2 diabetes mellitus without complications: Secondary | ICD-10-CM | POA: Diagnosis not present

## 2014-05-07 LAB — BASIC METABOLIC PANEL
ANION GAP: 17 — AB (ref 5–15)
BUN: 61 mg/dL — ABNORMAL HIGH (ref 6–23)
CHLORIDE: 92 meq/L — AB (ref 96–112)
CO2: 26 mEq/L (ref 19–32)
CREATININE: 3.6 mg/dL — AB (ref 0.50–1.35)
Calcium: 10 mg/dL (ref 8.4–10.5)
GFR calc Af Amer: 17 mL/min — ABNORMAL LOW (ref >90)
GFR calc non Af Amer: 15 mL/min — ABNORMAL LOW (ref >90)
Glucose, Bld: 390 mg/dL — ABNORMAL HIGH (ref 70–99)
POTASSIUM: 3.9 meq/L (ref 3.7–5.3)
Sodium: 135 mEq/L — ABNORMAL LOW (ref 137–147)

## 2014-05-07 LAB — CBG MONITORING, ED: Glucose-Capillary: 316 mg/dL — ABNORMAL HIGH (ref 70–99)

## 2014-05-07 LAB — CBC
HCT: 41.8 % (ref 39.0–52.0)
Hemoglobin: 14.9 g/dL (ref 13.0–17.0)
MCH: 30.8 pg (ref 26.0–34.0)
MCHC: 35.6 g/dL (ref 30.0–36.0)
MCV: 86.4 fL (ref 78.0–100.0)
Platelets: 121 10*3/uL — ABNORMAL LOW (ref 150–400)
RBC: 4.84 MIL/uL (ref 4.22–5.81)
RDW: 13.7 % (ref 11.5–15.5)
WBC: 8.1 10*3/uL (ref 4.0–10.5)

## 2014-05-07 LAB — PROTIME-INR
INR: 3.26 — AB (ref 0.00–1.49)
PROTHROMBIN TIME: 33.2 s — AB (ref 11.6–15.2)

## 2014-05-07 LAB — TROPONIN I: Troponin I: <0.3 ng/mL (ref <0.30)

## 2014-05-07 LAB — PRO B NATRIURETIC PEPTIDE: PRO B NATRI PEPTIDE: 12146 pg/mL — AB (ref 0–450)

## 2014-05-07 MED ORDER — PENICILLIN V POTASSIUM 250 MG PO TABS
500.0000 mg | ORAL_TABLET | Freq: Once | ORAL | Status: AC
  Administered 2014-05-07: 500 mg via ORAL
  Filled 2014-05-07: qty 2

## 2014-05-07 MED ORDER — PENICILLIN V POTASSIUM 500 MG PO TABS: 500.0000 mg | ORAL_TABLET | Freq: Four times a day (QID) | ORAL | Status: DC

## 2014-05-07 MED ORDER — INSULIN ASPART 100 UNIT/ML ~~LOC~~ SOLN
8.0000 [IU] | Freq: Once | SUBCUTANEOUS | Status: AC
  Administered 2014-05-07: 8 [IU] via SUBCUTANEOUS
  Filled 2014-05-07: qty 1

## 2014-05-07 NOTE — ED Notes (Addendum)
Patient states that he is having an episode of vertigo. History of same. Also states he is having a toothache on the left side

## 2014-05-07 NOTE — ED Provider Notes (Signed)
CSN: [id number]     Arrival date & time 05/07/14  1559 History  This chart was scribed for Orlie Dakin, MD by Cathie Hoops, ED Scribe. The patient was seen in [place]. The patient's care was started at 6:27 PM.     Chief Complaint  Patient presents with  . Dizziness   Patient is a 80 y.o. male presenting with dizziness. The history is provided by the patient. No language interpreter was used.  Dizziness Quality:  Vertigo Severity:  Moderate Timing:  Intermittent Chronicity:  Chronic Relieved by:  Change in position Worsened by:  Standing up Associated symptoms: no weakness   Risk factors: hx of vertigo    HPI Comments: Mitchell Iwanicki. is a 80 y.o. male who presents to the Emergency Department complaining of chronic, moderate dizziness, typical of a vertigo he's had in the past. Pt reports he has a previous history of vertigo onset one year ago and states his symptoms today feel similar. Pt denies Meclozine helped him. Pt reports when he leans over he feels some relief. Pt reports his symptoms are worsened when he lays on his right side and then stands up. Pt reports he cannot walk in a straight line. Pt denies light-headedness and weakness. He feels much improved now over earlier this morning. No trouble speaking no focal numbness or weakness  Pt also reports lower, left dental pain onset one day ago. Pt reports he might have a pus pocket on the left side of his jaw. Pt denies any difficulties breathing. Pt denies having a dentist.  Pt reports he currently takes baby aspirin. Pt reports taking 5 MG Warfarin for Atrial fibrillation prescribed by Dr. Leanne Chang. Pt reports he takes 5 MG Warfarin Tuesday-Sunday and states he takes 2.5 MG of Warfarin on Mondays. Pt states he is diabetic and has kidney disease. Pt reports he is compliant with his insulin. Pt denies he uses any other medications for his diabetes. Pt reports he has been diabetic for 20-30 years. Pt denies he ate anything today.  Pt denies having a dentist.   Past Medical History  Diagnosis Date  . Chronic atrial fibrillation     a. Historically difficult rate control. b. s/p CRT-D implantation with anticipation of AV junction ablation but patient then was able to accomplish rate control and ablation not undertaken.; AVN ablation 08/2013 by Dr Lovena Le  . CARDIOMYOPATHY, PRIMARY, DILATED     a. Mixed ICM/NICM - EF 15% by echo 05/2009; [employer] CRT-D implanted 06/2009. b. Echo 04/2013 - EF 20%, mild LVH.  [name] COLON CANCER, HX OF     a. s/p partial colectomy.  . Chronic systolic CHF (congestive heart failure)   . CORONARY ARTERY DISEASE     a. BMS to LAD 04/2008. b. Cath 06/2009: nonobstructive disease.  [name] PROSTATE CANCER, HX OF     a. s/p radical prostatectomy.  [name] SKIN CANCER, HX OF   . SLEEP APNEA   . Hypertension   . Hyperlipidemia   . Diabetes mellitus   . Osteoarthritis   . Tubular adenoma of colon   . Secondary hyperparathyroidism   . CKD (chronic kidney disease), stage IV     a. Right brachiocephalic AV fistula 12/979. Neph = [place].  . Vertigo   . Valvular heart disease     a. Echo 04/2013: mild AI, mild MR.  . Pulmonary HTN     a. PA pressure 50mmHg by echo 05/01/13.   Past Surgical History  Procedure Laterality Date  .  Colectomy    . Prostatectomy    . Penile prosthesis placement    . Removed prosthetic eye    . Ptca      stent placed  . Total knee arthroplasty  2008    right  . Pacemaker insertion  2010  . Av fistula placement  03/30/2012    Procedure: ARTERIOVENOUS (AV) FISTULA CREATION;  Surgeon: Angelia Mould, MD;  Location: [facility];  Service: Vascular;  Laterality: Right;  Creation of BrachioCephalic Fistula Right arm  . Eye surgery      left eye prothesis  . Fracture surgery      collar bone, right knee replacement  . Bi-ventricular implantable cardioverter defibrillator  (crt-d)  2010    STJ CRTD implanted by Dr [name]  . Ablation  08/2013    AVN ablation by Dr Lovena Le   Family  History  Problem Relation Age of Onset  . Heart disease Father   . Throat cancer Father   . Throat cancer Mother   . Heart disease Mother   . Hypertension Mother    History  Substance Use Topics  . Smoking status: Former Smoker    Types: Cigarettes    Quit date: 09/22/1966  . Smokeless tobacco: Never Used  . Alcohol Use: 0.0 - 0.5 oz/week    0-1 drink(s) per week     Comment: occasional    Review of Systems  Constitutional: Negative.   HENT: Positive for dental problem (lower, left).   Respiratory: Negative.   Cardiovascular: Negative.   Gastrointestinal: Negative.   Musculoskeletal: Negative.   Skin: Negative.   Neurological: Positive for dizziness.  Psychiatric/Behavioral: Negative.   All other systems reviewed and are negative.  Allergies  Ace inhibitors and Prednisone  Home Medications   Prior to Admission medications   Medication Sig Start Date End Date Taking? Authorizing Provider  aspirin 81 MG chewable tablet Chew 81 mg by mouth daily.    Historical Provider, MD  calcitRIOL (ROCALTROL) 0.25 MCG capsule Take 0.25 mcg by mouth 3 (three) times a week. 10/10/13   Burtis Junes, NP  digoxin (LANOXIN) 0.125 MG tablet Take 1 tablet (0.125 mg total) by mouth every other day. 03/30/14   Lisabeth Pick, MD  furosemide (LASIX) 80 MG tablet Take 1 tablet (80 mg total) by mouth 2 (two) times daily. 10/25/13   Sherren Mocha, MD  glucose blood (FREESTYLE LITE) test strip 1 each by Other route 2 (two) times daily as needed for other. Dx: 250.00 03/02/14   Lisabeth Pick, MD  hydrALAZINE (APRESOLINE) 25 MG tablet Take 1.5 tablets (37.5 mg total) by mouth 3 (three) times daily. 03/21/14   Sherren Mocha, MD  insulin lispro protamine-lispro (HUMALOG 75/25 MIX) (75-25) 100 UNIT/ML SUSP injection Inject 20 Units into the skin 2 (two) times daily with a meal. 02/19/14   Orson Eva, MD  isosorbide mononitrate (IMDUR) 30 MG 24 hr tablet Take 0.5 tablets (15 mg total) by mouth daily. 03/30/14    Lisabeth Pick, MD  Lancets (FREESTYLE) lancets 1 each by Other route 2 (two) times daily as needed for other. 02/22/14   [name], MD  metoprolol succinate (TOPROL-XL) 100 MG 24 hr tablet Take 1 tablet (100 mg total) by mouth 2 (two) times daily. Take with or immediately following a meal. 03/30/14   Lisabeth Pick, MD  nitroGLYCERIN (NITROSTAT) 0.4 MG SL tablet Place 0.4 mg under the tongue every 5 (five) minutes x 3 doses as needed for  chest pain.    Historical Provider, MD  potassium chloride SA (K-DUR,KLOR-CON) 20 MEQ tablet Take 1 tablet (20 mEq total) by mouth 2 (two) times daily. 03/30/14   Lisabeth Pick, MD  warfarin (COUMADIN) 5 MG tablet Take 2.5-5 mg by mouth See admin instructions. On Monday and Friday take 2.5 mg; On Tuesday, Wednesday, Thursday, Saturday, and Sunday takes 5 mg.    Historical Provider, MD   Triage Vitals: BP 171/81  Pulse 70  Temp(Src) 98.1 F (36.7 C) (Oral)  Resp 18  Ht 5\' 11"  (1.803 m)  Wt 205 lb (92.987 kg)  BMI 28.60 kg/m2  SpO2 99% Physical Exam  Nursing note and vitals reviewed. Constitutional: He is oriented to person, place, and time. He appears well-developed and well-nourished.  HENT:  Head: Normocephalic and atraumatic.  Generally Poor dentition. Left lower gingiva tender. No obvious fluctuance. No trismus.  Eyes: Conjunctivae are normal. Pupils are equal, round, and reactive to light.  Neck: Neck supple. No tracheal deviation present. No thyromegaly present.  Cardiovascular: Normal rate and regular rhythm.   No murmur heard. Pulmonary/Chest: Effort normal and breath sounds normal.  Abdominal: Soft. Bowel sounds are normal. He exhibits no distension. There is no tenderness.  Musculoskeletal: Normal range of motion. He exhibits no edema and no tenderness.  Neurological: He is alert and oriented to person, place, and time. No cranial nerve deficit. Coordination normal.  Gait normal Romberg normal pronator drift  Normal finger-nose normal   Skin: Skin is warm and dry. No rash noted.  Psychiatric: He has a normal mood and affect.    ED Course  Procedures (including critical care time) DIAGNOSTIC STUDIES: Oxygen Saturation is 99% on RA, normal by my interpretation.    COORDINATION OF CARE: 6:40 PM- Patient informed of current plan for treatment and evaluation and agrees with plan at this time.    Labs Review Labs Reviewed  CBC - Abnormal; Notable for the following:    Platelets 121 (*)    All other components within normal limits  PRO B NATRIURETIC PEPTIDE - Abnormal; Notable for the following:    Pro B Natriuretic peptide (BNP) 12146.0 (*)    All other components within normal limits  BASIC METABOLIC PANEL - Abnormal; Notable for the following:    Sodium 135 (*)    Chloride 92 (*)    Glucose, Bld 390 (*)    BUN 61 (*)    Creatinine, Ser 3.60 (*)    GFR calc non Af Amer 15 (*)    GFR calc Af Amer 17 (*)    Anion gap 17 (*)    All other components within normal limits  PROTIME-INR - Abnormal; Notable for the following:    Prothrombin Time 33.2 (*)    INR 3.26 (*)    All other components within normal limits  CBG MONITORING, ED - Abnormal; Notable for the following:    Glucose-Capillary 316 (*)    All other components within normal limits  TROPONIN I    Imaging Review No results found.   EKG Interpretation None      Date: 05/07/2014  Rate: 70  Rhythm: Electronically paced  QRS Axis: left  Intervals: normal  ST/T Wave abnormalities: nonspecific T wave changes  Conduction Disutrbances:nonspecific intraventricular conduction delay  Narrative Interpretation:   Old EKG Reviewed: Unchanged from 02/16/2014 interpreted by me Results for orders placed during the hospital encounter of 05/07/14  CBC      Result Value Ref Range   WBC  8.1  4.0 - 10.5 K/uL   RBC 4.84  4.22 - 5.81 MIL/uL   Hemoglobin 14.9  13.0 - 17.0 g/dL   HCT 41.8  39.0 - 52.0 %   MCV 86.4  78.0 - 100.0 fL   MCH 30.8  26.0 - 34.0 pg    MCHC 35.6  30.0 - 36.0 g/dL   RDW 13.7  11.5 - 15.5 %   Platelets 121 (*) 150 - 400 K/uL  PRO B NATRIURETIC PEPTIDE      Result Value Ref Range   Pro B Natriuretic peptide (BNP) 12146.0 (*) 0 - 450 pg/mL  BASIC METABOLIC PANEL      Result Value Ref Range   Sodium 135 (*) 137 - 147 mEq/L   Potassium 3.9  3.7 - 5.3 mEq/L   Chloride 92 (*) 96 - 112 mEq/L   CO2 26  19 - 32 mEq/L   Glucose, Bld 390 (*) 70 - 99 mg/dL   BUN 61 (*) 6 - 23 mg/dL   Creatinine, Ser 3.60 (*) 0.50 - 1.35 mg/dL   Calcium 10.0  8.4 - 10.5 mg/dL   GFR calc non Af Amer 15 (*) >90 mL/min   GFR calc Af Amer 17 (*) >90 mL/min   Anion gap 17 (*) 5 - 15  TROPONIN I      Result Value Ref Range   Troponin I <0.30  <0.30 ng/mL  PROTIME-INR      Result Value Ref Range   Prothrombin Time 33.2 (*) 11.6 - 15.2 seconds   INR 3.26 (*) 0.00 - 1.49  CBG MONITORING, ED      Result Value Ref Range   Glucose-Capillary 316 (*) 70 - 99 mg/dL   Comment 1 Documented in Chart     Comment 2 Notify RN     No results found. 805 pm pt resting comfortably gcs 15 , alert nad Results for orders placed during the hospital encounter of 05/07/14  CBC      Result Value Ref Range   WBC 8.1  4.0 - 10.5 K/uL   RBC 4.84  4.22 - 5.81 MIL/uL   Hemoglobin 14.9  13.0 - 17.0 g/dL   HCT 41.8  39.0 - 52.0 %   MCV 86.4  78.0 - 100.0 fL   MCH 30.8  26.0 - 34.0 pg   MCHC 35.6  30.0 - 36.0 g/dL   RDW 13.7  11.5 - 15.5 %   Platelets 121 (*) 150 - 400 K/uL  PRO B NATRIURETIC PEPTIDE      Result Value Ref Range   Pro B Natriuretic peptide (BNP) 12146.0 (*) 0 - 450 pg/mL  BASIC METABOLIC PANEL      Result Value Ref Range   Sodium 135 (*) 137 - 147 mEq/L   Potassium 3.9  3.7 - 5.3 mEq/L   Chloride 92 (*) 96 - 112 mEq/L   CO2 26  19 - 32 mEq/L   Glucose, Bld 390 (*) 70 - 99 mg/dL   BUN 61 (*) 6 - 23 mg/dL   Creatinine, Ser 3.60 (*) 0.50 - 1.35 mg/dL   Calcium 10.0  8.4 - 10.5 mg/dL   GFR calc non Af Amer 15 (*) >90 mL/min   GFR calc Af Amer  17 (*) >90 mL/min   Anion gap 17 (*) 5 - 15  TROPONIN I      Result Value Ref Range   Troponin I <0.30  <0.30 ng/mL  PROTIME-INR  Result Value Ref Range   Prothrombin Time 33.2 (*) 11.6 - 15.2 seconds   INR 3.26 (*) 0.00 - 1.49  CBG MONITORING, ED      Result Value Ref Range   Glucose-Capillary 316 (*) 70 - 99 mg/dL   Comment 1 Documented in Chart     Comment 2 Notify RN     No results found.  MDM   Final diagnoses:  None   patient denies shortness of breath. Clinically he is not in CHF. He suffers from chronic kidney disease leading itself to elevated BNP,  Which wasnot ordered by me and does not correlate clinically. Renal insufficiency is chronic elevated blood sugar likely secondary to dental infection. He exhibits no sign of stroke. INR is mildly elevated at 3.24. He is very taken today's warfarin dose. He is instructed to stop tomorrow for for dose.  Plan Tylenol for pain. Patient declined other pain medicine and declined pain medicine here today. Prescription [facility]. Dental referral. He has scheduled appointment at Coumadin clinic in 4 days Diagnosis #1 vertigo #2 dental pain #3 hyperglycemia #4 chronic renal insufficiency #5 mild coumadin toxicity    .   Orlie Dakin, MD 05/07/14 2012

## 2014-05-07 NOTE — Discharge Instructions (Signed)
Don't take any warfarin tomorrow. Your INR was mildly elevated today at 3.24. Keep your scheduled appointment with the Coumadin clinic for Thursday, 05/11/2014. Take Tylenol for pain. Your blood sugar was elevated upon arrival today at 390. We feel this likely secondary to dental infection. Call the dentist recommended tomorrow to schedule the next available appointment. Tell his office staff that you were seen here when scheduling the appointment. Take Tylenol as directed for pain

## 2014-05-11 ENCOUNTER — Ambulatory Visit: Payer: Medicare Other | Admitting: Family

## 2014-05-11 ENCOUNTER — Ambulatory Visit (INDEPENDENT_AMBULATORY_CARE_PROVIDER_SITE_OTHER): Payer: Medicare Other | Admitting: Family

## 2014-05-11 ENCOUNTER — Ambulatory Visit: Payer: Medicare Other

## 2014-05-11 DIAGNOSIS — I4891 Unspecified atrial fibrillation: Secondary | ICD-10-CM

## 2014-05-11 DIAGNOSIS — Z5181 Encounter for therapeutic drug level monitoring: Secondary | ICD-10-CM

## 2014-05-11 DIAGNOSIS — I482 Chronic atrial fibrillation, unspecified: Secondary | ICD-10-CM

## 2014-05-11 LAB — POCT INR: INR: 2

## 2014-05-11 NOTE — Patient Instructions (Addendum)
   Due to decreased appetite, Take 1/2 tablet daily. Recheck in 1 week or after dental extractions. Anticoagulation Dose Instructions as of 05/11/2014     Dorene Grebe Tue Wed Thu Fri Sat   New Dose 2.5 mg 2.5 mg 2.5 mg 2.5 mg 2.5 mg 2.5 mg 2.5 mg    Description       Due to decreased appetite, Take 1/2 tablet daily. Recheck in 1 week or after dental extractions.

## 2014-05-18 ENCOUNTER — Ambulatory Visit: Payer: Medicare Other | Admitting: Family

## 2014-05-19 ENCOUNTER — Ambulatory Visit (INDEPENDENT_AMBULATORY_CARE_PROVIDER_SITE_OTHER): Payer: Medicare Other | Admitting: Podiatry

## 2014-05-19 ENCOUNTER — Encounter: Payer: Self-pay | Admitting: Podiatry

## 2014-05-19 DIAGNOSIS — M79609 Pain in unspecified limb: Secondary | ICD-10-CM

## 2014-05-19 DIAGNOSIS — B351 Tinea unguium: Secondary | ICD-10-CM

## 2014-05-19 DIAGNOSIS — M79606 Pain in leg, unspecified: Secondary | ICD-10-CM

## 2014-05-19 NOTE — Patient Instructions (Signed)
Seen for hypertrophic nails. All nails debrided. Return in 3 months or as needed.  

## 2014-05-19 NOTE — Progress Notes (Signed)
Subjective:  80 y.o. year old male patient presents requesting toe nails trimmed.   Objective:  Dermatologic:  Thick dystrophic nails x 10. Skin: No skin lesions.  Vascular:  Dorsalis pedis pulses palpable on both feet.  Posterior tibial pulses are not palpable on both feet.  No edema or erythema, no ischemic changes noted.  Orthopedic:  Cavus type foot.  Neurologic:  Failed respond to Monofilament sensory testing.  Vibratory and Ankle DTR is within normal bilateral.   Assessment:  Dystrophic mycotic nails x 9.  Diabetic neuropathy.   Treatment: All mycotic nails and ingrown nails debrided.  Return in 3 months or as needed

## 2014-05-22 ENCOUNTER — Telehealth: Payer: Self-pay | Admitting: Internal Medicine

## 2014-05-22 MED ORDER — FUROSEMIDE 80 MG PO TABS: 80.0000 mg | ORAL_TABLET | Freq: Two times a day (BID) | ORAL | Status: DC

## 2014-05-22 NOTE — Telephone Encounter (Signed)
Left detailed message Rx ready for pickup. Rx printed and signed. 

## 2014-05-22 NOTE — Telephone Encounter (Signed)
Pt called to say that he need a rx for the following furosemide (LASIX) 80 MG tablet. [facility] sent the med but pt does not know what happen to it so he is requesting to pick up rx at a local pharmacy .  Pt said he need the rx for him to pick up because he does not know what pharmacy he will use.

## 2014-05-30 ENCOUNTER — Ambulatory Visit (INDEPENDENT_AMBULATORY_CARE_PROVIDER_SITE_OTHER): Payer: Medicare Other | Admitting: Family

## 2014-05-30 DIAGNOSIS — I482 Chronic atrial fibrillation, unspecified: Secondary | ICD-10-CM

## 2014-05-30 DIAGNOSIS — I4891 Unspecified atrial fibrillation: Secondary | ICD-10-CM

## 2014-05-30 DIAGNOSIS — Z5181 Encounter for therapeutic drug level monitoring: Secondary | ICD-10-CM

## 2014-05-30 LAB — POCT INR: INR: 1.7

## 2014-05-30 NOTE — Patient Instructions (Signed)
Starting today, take 1 tab on Tuesdays and 1/2 tab all other days. Recheck in 10 days.   Anticoagulation Dose Instructions as of 05/30/2014     Dorene Grebe Tue Wed Thu Fri Sat   New Dose 2.5 mg 2.5 mg 5 mg 2.5 mg 2.5 mg 2.5 mg 2.5 mg    Description       Starting today, take 1 tab on Tuesdays and 1/2 tab all other days. Recheck in 10 days.

## 2014-06-08 ENCOUNTER — Ambulatory Visit (INDEPENDENT_AMBULATORY_CARE_PROVIDER_SITE_OTHER): Payer: Medicare Other | Admitting: Family

## 2014-06-08 DIAGNOSIS — Z5181 Encounter for therapeutic drug level monitoring: Secondary | ICD-10-CM

## 2014-06-08 DIAGNOSIS — I4891 Unspecified atrial fibrillation: Secondary | ICD-10-CM

## 2014-06-08 DIAGNOSIS — I482 Chronic atrial fibrillation, unspecified: Secondary | ICD-10-CM

## 2014-06-08 LAB — POCT INR: INR: 1.4

## 2014-06-08 NOTE — Patient Instructions (Signed)
Today take 2 tab. Then increase dosage to 5 mg on T/TH/Sat. All other days, 2.5mg . Recheck in 2 weeks.

## 2014-06-19 ENCOUNTER — Telehealth: Payer: Self-pay | Admitting: Internal Medicine

## 2014-06-19 NOTE — Telephone Encounter (Signed)
New message      FYI Pt is scheduled for a remote transmission check on Thursday.  He will do it today because he will be traveling on thursday

## 2014-06-19 NOTE — Telephone Encounter (Signed)
FYI

## 2014-06-20 ENCOUNTER — Ambulatory Visit: Payer: Medicare Other | Admitting: Family

## 2014-06-20 NOTE — Telephone Encounter (Signed)
Transmission received.

## 2014-06-21 ENCOUNTER — Other Ambulatory Visit: Payer: Self-pay | Admitting: *Deleted

## 2014-06-21 ENCOUNTER — Encounter: Payer: Self-pay | Admitting: Internal Medicine

## 2014-06-21 ENCOUNTER — Ambulatory Visit (INDEPENDENT_AMBULATORY_CARE_PROVIDER_SITE_OTHER): Payer: Medicare Other | Admitting: Internal Medicine

## 2014-06-21 ENCOUNTER — Ambulatory Visit (INDEPENDENT_AMBULATORY_CARE_PROVIDER_SITE_OTHER): Payer: Medicare Other | Admitting: Family

## 2014-06-21 VITALS — BP 156/80 | HR 70 | Temp 98.3°F | Resp 20 | Ht 71.0 in | Wt 179.0 lb

## 2014-06-21 DIAGNOSIS — M1990 Unspecified osteoarthritis, unspecified site: Secondary | ICD-10-CM

## 2014-06-21 DIAGNOSIS — I480 Paroxysmal atrial fibrillation: Secondary | ICD-10-CM

## 2014-06-21 DIAGNOSIS — I482 Chronic atrial fibrillation, unspecified: Secondary | ICD-10-CM

## 2014-06-21 DIAGNOSIS — E785 Hyperlipidemia, unspecified: Secondary | ICD-10-CM

## 2014-06-21 DIAGNOSIS — I509 Heart failure, unspecified: Secondary | ICD-10-CM

## 2014-06-21 DIAGNOSIS — I2510 Atherosclerotic heart disease of native coronary artery without angina pectoris: Secondary | ICD-10-CM

## 2014-06-21 DIAGNOSIS — I5043 Acute on chronic combined systolic (congestive) and diastolic (congestive) heart failure: Secondary | ICD-10-CM

## 2014-06-21 DIAGNOSIS — I4891 Unspecified atrial fibrillation: Secondary | ICD-10-CM

## 2014-06-21 DIAGNOSIS — Z23 Encounter for immunization: Secondary | ICD-10-CM

## 2014-06-21 DIAGNOSIS — Z5181 Encounter for therapeutic drug level monitoring: Secondary | ICD-10-CM

## 2014-06-21 DIAGNOSIS — E119 Type 2 diabetes mellitus without complications: Secondary | ICD-10-CM

## 2014-06-21 DIAGNOSIS — I10 Essential (primary) hypertension: Secondary | ICD-10-CM

## 2014-06-21 LAB — POCT INR: INR: 1.7

## 2014-06-21 LAB — HEMOGLOBIN A1C: Hgb A1c MFr Bld: 10.5 % — ABNORMAL HIGH (ref 4.6–6.5)

## 2014-06-21 MED ORDER — INSULIN LISPRO PROT & LISPRO (75-25 MIX) 100 UNIT/ML ~~LOC~~ SUSP: 26.0000 [IU] | Freq: Two times a day (BID) | SUBCUTANEOUS | Status: DC

## 2014-06-21 NOTE — Patient Instructions (Signed)
Take an extra 1/2 tab today and tomorrow. Then increase coumadin to 2.5mg  on Mondays, Wednesdays, and Fridays. All other days 5 mg.  Anticoagulation Dose Instructions as of 06/21/2014     Dorene Grebe Tue Wed Thu Fri Sat   New Dose 5 mg 2.5 mg 5 mg 2.5 mg 5 mg 2.5 mg 5 mg    Description       Take an extra 1/2 tab today and tomorrow. Then increase coumadin to 2.5mg  on Mondays, Wednesdays, and Fridays. All other days 5 mg.

## 2014-06-21 NOTE — Progress Notes (Signed)
Subjective:    Patient ID: [name] Bunn., male    DOB: 1931-05-02, 80 y.o.   MRN: [id number]  HPI   80 year old patient who is seen today to establish with my practice.  He is followed closely by cardiology and has a history of ischemic cardiomyopathy and atrial fibrillation.  He is status post CRT-D and is on chronic Coumadin anticoagulation. He has chronic kidney disease and is also followed closely by nephrology.  He has had a fistula placed in the right upper arm. He has type 2 diabetes.  Last hemoglobin A1c 7 point 6.  Recent lab draw revealed a blood sugar in excess of 300.  No home blood sugar monitoring.  75/25 insulin down titrated at the time of his last hospital discharge to 20 units twice daily.  His appetite has improved  Past Medical History  Diagnosis Date  . Chronic atrial fibrillation     a. Historically difficult rate control. b. s/p CRT-D implantation with anticipation of AV junction ablation but patient then was able to accomplish rate control and ablation not undertaken.; AVN ablation 08/2013 by Dr Lovena Le  . CARDIOMYOPATHY, PRIMARY, DILATED     a. Mixed ICM/NICM - EF 15% by echo 05/2009; [employer] Jude CRT-D implanted 06/2009. b. Echo 04/2013 - EF 20%, mild LVH.  [name] COLON CANCER, HX OF     a. s/p partial colectomy.  . Chronic systolic CHF (congestive heart failure)   . CORONARY ARTERY DISEASE     a. BMS to LAD 04/2008. b. Cath 06/2009: nonobstructive disease.  [name] PROSTATE CANCER, HX OF     a. s/p radical prostatectomy.  [name] SKIN CANCER, HX OF   . SLEEP APNEA   . Hypertension   . Hyperlipidemia   . Diabetes mellitus   . Osteoarthritis   . Tubular adenoma of colon   . Secondary hyperparathyroidism   . CKD (chronic kidney disease), stage IV     a. Right brachiocephalic AV fistula 11/6142. Neph = Dunham.  . Vertigo   . Valvular heart disease     a. Echo 04/2013: mild AI, mild MR.  . Pulmonary HTN     a. PA pressure 35mmHg by echo 05/01/13.    History   Social History   . Marital Status: Widowed    Spouse Name: N/A    Number of Children: 4  . Years of Education: N/A   Occupational History  . retired    Social History Main Topics  . Smoking status: Former Smoker    Types: Cigarettes    Quit date: 09/22/1966  . Smokeless tobacco: Never Used  . Alcohol Use: 0.0 - 0.5 oz/week    0-1 drink(s) per week     Comment: occasional  . Drug Use: No  . Sexual Activity: No   Other Topics Concern  . Not on file   Social History Narrative  . No narrative on file    Past Surgical History  Procedure Laterality Date  . Colectomy    . Prostatectomy    . Penile prosthesis placement    . Removed prosthetic eye    . Ptca      stent placed  . Total knee arthroplasty  2008    right  . Pacemaker insertion  2010  . Av fistula placement  03/30/2012    Procedure: ARTERIOVENOUS (AV) FISTULA CREATION;  Surgeon: Angelia Mould, MD;  Location: [place];  Service: Vascular;  Laterality: Right;  Creation of BrachioCephalic Fistula Right arm  .  Eye surgery      left eye prothesis  . Fracture surgery      collar bone, right knee replacement  . Bi-ventricular implantable cardioverter defibrillator  (crt-d)  2010    STJ CRTD implanted by Dr [name]  . Ablation  08/2013    AVN ablation by Dr Lovena Le    Family History  Problem Relation Age of Onset  . Heart disease Father   . Throat cancer Father   . Throat cancer Mother   . Heart disease Mother   . Hypertension Mother     Allergies  Allergen Reactions  . Ace Inhibitors     Stopped by Dr. Burt Knack due to progressive renal failure  . Prednisone Other (See Comments)    Raised blood sugar    Current Outpatient Prescriptions on File Prior to Visit  Medication Sig Dispense Refill  . aspirin 81 MG chewable tablet Chew 81 mg by mouth daily.      . calcitRIOL (ROCALTROL) 0.25 MCG capsule Take 0.25 mcg by mouth 3 (three) times a week.      . digoxin (LANOXIN) 0.125 MG tablet Take 1 tablet (0.125 mg total) by mouth  every other day.  45 tablet  3  . furosemide (LASIX) 80 MG tablet Take 1 tablet (80 mg total) by mouth 2 (two) times daily.  180 tablet  1  . glucose blood (FREESTYLE LITE) test strip 1 each by Other route 2 (two) times daily as needed for other. Dx: 250.00  100 each  3  . hydrALAZINE (APRESOLINE) 25 MG tablet Take 1.5 tablets (37.5 mg total) by mouth 3 (three) times daily.  405 tablet  3  . insulin lispro protamine-lispro (HUMALOG 75/25 MIX) (75-25) 100 UNIT/ML SUSP injection Inject 20 Units into the skin 2 (two) times daily with a meal.  10 mL  11  . isosorbide mononitrate (IMDUR) 30 MG 24 hr tablet Take 0.5 tablets (15 mg total) by mouth daily.  90 tablet  3  . Lancets (FREESTYLE) lancets 1 each by Other route 2 (two) times daily as needed for other.  100 each  12  . metoprolol succinate (TOPROL-XL) 100 MG 24 hr tablet Take 1 tablet (100 mg total) by mouth 2 (two) times daily. Take with or immediately following a meal.  180 tablet  3  . nitroGLYCERIN (NITROSTAT) 0.4 MG SL tablet Place 0.4 mg under the tongue every 5 (five) minutes x 3 doses as needed for chest pain.      . potassium chloride SA (K-DUR,KLOR-CON) 20 MEQ tablet Take 1 tablet (20 mEq total) by mouth 2 (two) times daily.  180 tablet  3  . warfarin (COUMADIN) 5 MG tablet Take 2.5-5 mg by mouth See admin instructions. On Monday and Friday take 2.5 mg; On Tuesday, Wednesday, Thursday, Saturday, and Sunday takes 5 mg.       No current facility-administered medications on file prior to visit.    BP 156/80  Pulse 70  Temp(Src) 98.3 F (36.8 C) (Oral)  Resp 20  Ht 5\' 11"  (1.803 m)  Wt 179 lb (81.194 kg)  BMI 24.98 kg/m2  SpO2 98%     Review of Systems  Constitutional: Negative for fever, chills, appetite change and fatigue.  HENT: Negative for congestion, dental problem, ear pain, hearing loss, sore throat, tinnitus, trouble swallowing and voice change.   Eyes: Negative for pain, discharge and visual disturbance.  Respiratory:  Negative for cough, chest tightness, wheezing and stridor.   Cardiovascular: Negative for  chest pain, palpitations and leg swelling.  Gastrointestinal: Negative for nausea, vomiting, abdominal pain, diarrhea, constipation, blood in stool and abdominal distention.  Genitourinary: Negative for urgency, hematuria, flank pain, discharge, difficulty urinating and genital sores.  Musculoskeletal: Negative for arthralgias, back pain, gait problem, joint swelling, myalgias and neck stiffness.  Skin: Negative for rash.  Neurological: Negative for dizziness, syncope, speech difficulty, weakness, numbness and headaches.  Hematological: Negative for adenopathy. Does not bruise/bleed easily.  Psychiatric/Behavioral: Negative for behavioral problems and dysphoric mood. The patient is not nervous/anxious.        Objective:   Physical Exam  Constitutional: He is oriented to person, place, and time. He appears well-developed.  Blood pressure 130/76, left arm  HENT:  Head: Normocephalic.  Right Ear: External ear normal.  Left Ear: External ear normal.  Edentulous with dentures in place  Eyes: Conjunctivae and EOM are normal.  Prosthetic left eye  Neck: Normal range of motion.  Cardiovascular: Normal rate and normal heart sounds.   Dorsalis pedis pulses full Posterior tibial pulses not easily palpable  Transmitted to and fro murmur from fistula  Pulmonary/Chest: Breath sounds normal.  CRT the left upper anterior chest wall-  Abdominal: Bowel sounds are normal.  Genitourinary:  Penile prosthesis  Musculoskeletal: Normal range of motion. He exhibits no edema and no tenderness.  Status post right total replacement surgery  Neurological: He is alert and oriented to person, place, and time.  Psychiatric: He has a normal mood and affect. His behavior is normal.          Assessment & Plan:   Ischemic cardiopathy Atrial fibrillation Hypertension well controlled Diabetes mellitus.  We'll check a  hemoglobin A1c.  We'll uptitrate 75/25 insulin Chronic kidney disease.  Followup nephrology  Recheck in 3 months

## 2014-06-21 NOTE — Patient Instructions (Signed)
Limit your sodium (Salt) intake  Increase insulin to 26 units twice daily   Please check your hemoglobin A1c every 3 months

## 2014-06-21 NOTE — Progress Notes (Signed)
Pre visit review using our clinic review tool, if applicable. No additional management support is needed unless otherwise documented below in the visit note. 

## 2014-06-21 NOTE — Progress Notes (Signed)
   Subjective:    Patient ID: [name] [name]., male    DOB: 12/15/1930, 80 y.o.   MRN: [id number]  HPI Wt Readings from Last 3 Encounters:  06/21/14 179 lb (81.194 kg)  05/07/14 205 lb (92.987 kg)  03/21/14 184 lb 12.8 oz (83.825 kg)   Lab Results  Component Value Date   HGBA1C 7.6* 02/17/2014     Review of Systems    see below Objective:   Physical Exam  See below      Assessment & Plan:  See below

## 2014-06-22 ENCOUNTER — Ambulatory Visit (INDEPENDENT_AMBULATORY_CARE_PROVIDER_SITE_OTHER): Payer: Medicare Other | Admitting: *Deleted

## 2014-06-22 DIAGNOSIS — I5022 Chronic systolic (congestive) heart failure: Secondary | ICD-10-CM

## 2014-06-22 DIAGNOSIS — I4819 Other persistent atrial fibrillation: Secondary | ICD-10-CM

## 2014-06-22 DIAGNOSIS — I481 Persistent atrial fibrillation: Secondary | ICD-10-CM

## 2014-06-22 LAB — MDC_IDC_ENUM_SESS_TYPE_REMOTE
Battery Remaining Percentage: 45 %
Battery Voltage: 2.9 V
Date Time Interrogation Session: 20151001062915
HighPow Impedance: 43 Ohm
Implantable Pulse Generator Serial Number: 595859
Lead Channel Impedance Value: 380 Ohm
Lead Channel Pacing Threshold Amplitude: 1.375 V
Lead Channel Pacing Threshold Pulse Width: 0.6 ms
Lead Channel Pacing Threshold Pulse Width: 1 ms
Lead Channel Sensing Intrinsic Amplitude: 12 mV
Lead Channel Setting Pacing Amplitude: 2.375
Lead Channel Setting Pacing Amplitude: 2.5 V
Lead Channel Setting Pacing Pulse Width: 1 ms
Lead Channel Setting Sensing Sensitivity: 0.5 mV
MDC IDC MSMT BATTERY REMAINING LONGEVITY: 34 mo
MDC IDC MSMT LEADCHNL LV IMPEDANCE VALUE: 430 Ohm
MDC IDC MSMT LEADCHNL RV PACING THRESHOLD AMPLITUDE: 0.75 V
MDC IDC SET LEADCHNL RV PACING PULSEWIDTH: 0.6 ms
MDC IDC SET ZONE DETECTION INTERVAL: 250 ms
MDC IDC STAT BRADY RV PERCENT PACED: 93 %
Zone Setting Detection Interval: 300 ms

## 2014-06-22 NOTE — Progress Notes (Signed)
Remote ICD transmission.   

## 2014-07-06 ENCOUNTER — Ambulatory Visit: Payer: Medicare Other | Admitting: Family

## 2014-07-11 ENCOUNTER — Encounter: Payer: Self-pay | Admitting: Cardiology

## 2014-07-13 ENCOUNTER — Ambulatory Visit (INDEPENDENT_AMBULATORY_CARE_PROVIDER_SITE_OTHER): Payer: Medicare Other | Admitting: Family

## 2014-07-13 ENCOUNTER — Encounter: Payer: Self-pay | Admitting: Internal Medicine

## 2014-07-13 ENCOUNTER — Ambulatory Visit (INDEPENDENT_AMBULATORY_CARE_PROVIDER_SITE_OTHER): Payer: Medicare Other | Admitting: Internal Medicine

## 2014-07-13 VITALS — BP 140/84 | Temp 98.0°F | Wt 181.0 lb

## 2014-07-13 DIAGNOSIS — IMO0002 Reserved for concepts with insufficient information to code with codable children: Secondary | ICD-10-CM

## 2014-07-13 DIAGNOSIS — E1165 Type 2 diabetes mellitus with hyperglycemia: Secondary | ICD-10-CM

## 2014-07-13 DIAGNOSIS — Z5181 Encounter for therapeutic drug level monitoring: Secondary | ICD-10-CM

## 2014-07-13 DIAGNOSIS — Z23 Encounter for immunization: Secondary | ICD-10-CM

## 2014-07-13 DIAGNOSIS — I482 Chronic atrial fibrillation, unspecified: Secondary | ICD-10-CM

## 2014-07-13 DIAGNOSIS — I2510 Atherosclerotic heart disease of native coronary artery without angina pectoris: Secondary | ICD-10-CM

## 2014-07-13 DIAGNOSIS — E0821 Diabetes mellitus due to underlying condition with diabetic nephropathy: Secondary | ICD-10-CM

## 2014-07-13 LAB — POCT INR: INR: 1.7

## 2014-07-13 LAB — GLUCOSE, POCT (MANUAL RESULT ENTRY): POC Glucose: 189 mg/dl — AB (ref 70–99)

## 2014-07-13 MED ORDER — GLUCOSE BLOOD VI STRP: 1.0000 | ORAL_STRIP | Freq: Two times a day (BID) | Status: DC | PRN

## 2014-07-13 MED ORDER — FUROSEMIDE 80 MG PO TABS: 80.0000 mg | ORAL_TABLET | Freq: Two times a day (BID) | ORAL | Status: DC

## 2014-07-13 MED ORDER — WARFARIN SODIUM 5 MG PO TABS: 2.5000 mg | ORAL_TABLET | ORAL | Status: DC

## 2014-07-13 NOTE — Progress Notes (Signed)
Subjective:    Patient ID: [name] [name]., male    DOB: [birth year], 80 y.o.   MRN: [id number]  HPI  80 year old patient who was seen one month ago with a hemoglobin A1c of greater than 10.  75/25 insulin was increased to 26 units twice a day.  Still no home blood sugar monitoring.  No symptomatic hypoglycemia He has treated hypertension Complaints.  They include left shoulder pain with decreased range of motion  Past Medical History  Diagnosis Date  . Chronic atrial fibrillation     a. Historically difficult rate control. b. s/p CRT-D implantation with anticipation of AV junction ablation but patient then was able to accomplish rate control and ablation not undertaken.; AVN ablation 08/2013 by Dr Lovena Le  . CARDIOMYOPATHY, PRIMARY, DILATED     a. Mixed ICM/NICM - EF 15% by echo 05/2009; St. Jude CRT-D implanted 06/2009. b. Echo 04/2013 - EF 20%, mild LVH.  [name] COLON CANCER, HX OF     a. s/p partial colectomy.  . Chronic systolic CHF (congestive heart failure)   . CORONARY ARTERY DISEASE     a. BMS to LAD 04/2008. b. Cath 06/2009: nonobstructive disease.  [name] PROSTATE CANCER, HX OF     a. s/p radical prostatectomy.  [name] SKIN CANCER, HX OF   . SLEEP APNEA   . Hypertension   . Hyperlipidemia   . Diabetes mellitus   . Osteoarthritis   . Tubular adenoma of colon   . Secondary hyperparathyroidism   . CKD (chronic kidney disease), stage IV     a. Right brachiocephalic AV fistula 04/3728. Neph = Dunham.  . Vertigo   . Valvular heart disease     a. Echo 04/2013: mild AI, mild MR.  . Pulmonary HTN     a. PA pressure 81mmHg by echo 05/01/13.    History   Social History  . Marital Status: Widowed    Spouse Name: N/A    Number of Children: 4  . Years of Education: N/A   Occupational History  . retired    Social History Main Topics  . Smoking status: Former Smoker    Types: Cigarettes    Quit date: 09/22/1966  . Smokeless tobacco: Never Used  . Alcohol Use: 0.0 - 0.5 oz/week   0-1 drink(s) per week     Comment: occasional  . Drug Use: No  . Sexual Activity: No   Other Topics Concern  . Not on file   Social History Narrative  . No narrative on file    Past Surgical History  Procedure Laterality Date  . Colectomy    . Prostatectomy    . Penile prosthesis placement    . Removed prosthetic eye    . Ptca      stent placed  . Total knee arthroplasty  2008    right  . Pacemaker insertion  2010  . Av fistula placement  03/30/2012    Procedure: ARTERIOVENOUS (AV) FISTULA CREATION;  Surgeon: Angelia Mould, MD;  Location: [facility];  Service: Vascular;  Laterality: Right;  Creation of BrachioCephalic Fistula Right arm  . Eye surgery      left eye prothesis  . Fracture surgery      collar bone, right knee replacement  . Bi-ventricular implantable cardioverter defibrillator  (crt-d)  2010    STJ CRTD implanted by Dr [name]  . Ablation  08/2013    AVN ablation by Dr Lovena Le    Family History  Problem Relation Age  of Onset  . Heart disease Father   . Throat cancer Father   . Throat cancer Mother   . Heart disease Mother   . Hypertension Mother     Allergies  Allergen Reactions  . Ace Inhibitors     Stopped by Dr. Burt Knack due to progressive renal failure  . Prednisone Other (See Comments)    Raised blood sugar    Current Outpatient Prescriptions on File Prior to Visit  Medication Sig Dispense Refill  . aspirin 81 MG chewable tablet Chew 81 mg by mouth daily.      . calcitRIOL (ROCALTROL) 0.25 MCG capsule Take 0.25 mcg by mouth 3 (three) times a week.      . digoxin (LANOXIN) 0.125 MG tablet Take 1 tablet (0.125 mg total) by mouth every other day.  45 tablet  3  . hydrALAZINE (APRESOLINE) 25 MG tablet Take 1.5 tablets (37.5 mg total) by mouth 3 (three) times daily.  405 tablet  3  . insulin lispro protamine-lispro (HUMALOG 75/25 MIX) (75-25) 100 UNIT/ML SUSP injection Inject 26 Units into the skin 2 (two) times daily with a meal.  10 mL  11  .  isosorbide mononitrate (IMDUR) 30 MG 24 hr tablet Take 0.5 tablets (15 mg total) by mouth daily.  90 tablet  3  . Lancets (FREESTYLE) lancets 1 each by Other route 2 (two) times daily as needed for other.  100 each  12  . meclizine (ANTIVERT) 25 MG tablet Take 25 mg by mouth 3 (three) times daily as needed for dizziness.      . metoprolol succinate (TOPROL-XL) 100 MG 24 hr tablet Take 1 tablet (100 mg total) by mouth 2 (two) times daily. Take with or immediately following a meal.  180 tablet  3  . nitroGLYCERIN (NITROSTAT) 0.4 MG SL tablet Place 0.4 mg under the tongue every 5 (five) minutes x 3 doses as needed for chest pain.      . potassium chloride SA (K-DUR,KLOR-CON) 20 MEQ tablet Take 1 tablet (20 mEq total) by mouth 2 (two) times daily.  180 tablet  3   No current facility-administered medications on file prior to visit.    BP 140/84  Temp(Src) 98 F (36.7 C) (Oral)  Wt 181 lb (82.101 kg)     Review of Systems  Constitutional: Negative for fever, chills, appetite change and fatigue.  HENT: Negative for congestion, dental problem, ear pain, hearing loss, sore throat, tinnitus, trouble swallowing and voice change.   Eyes: Negative for pain, discharge and visual disturbance.  Respiratory: Negative for cough, chest tightness, wheezing and stridor.   Cardiovascular: Negative for chest pain, palpitations and leg swelling.  Gastrointestinal: Negative for nausea, vomiting, abdominal pain, diarrhea, constipation, blood in stool and abdominal distention.  Genitourinary: Negative for urgency, hematuria, flank pain, discharge, difficulty urinating and genital sores.  Musculoskeletal: Negative for arthralgias, back pain, gait problem, joint swelling, myalgias and neck stiffness.       Left shoulder pain  Skin: Negative for rash.  Neurological: Negative for dizziness, syncope, speech difficulty, weakness, numbness and headaches.  Hematological: Negative for adenopathy. Does not bruise/bleed  easily.  Psychiatric/Behavioral: Negative for behavioral problems and dysphoric mood. The patient is not nervous/anxious.        Objective:   Physical Exam  Musculoskeletal:  Left shoulder examined Positive Drop arm test  mildly positive external rotation resistance test           Assessment & Plan:  Diabetes mellitus.  We'll continue  mixed splint, insulin therapy  Samples provided.  Importance of home blood sugar monitoring.  Encouraged Recheck 4 weeks  The shoulder tendinopathy.  Will observe.  Patient plans to start swimming.  We'll reassess in one month

## 2014-07-13 NOTE — Patient Instructions (Addendum)
Return in one month for followup  Notify office if blood sugars are consistently over 200

## 2014-07-13 NOTE — Patient Instructions (Signed)
Take an extra 1/2 tab today and tomorrow. Then increase coumadin to 2.5mg  on Mondays and Fridays. All other days 5 mg. Recheck in 2 weeks.   Anticoagulation Dose Instructions as of 07/13/2014     Dorene Grebe Tue Wed Thu Fri Sat   New Dose 5 mg 2.5 mg 5 mg 5 mg 5 mg 2.5 mg 5 mg    Description       Take an extra 1/2 tab today and tomorrow. Then increase coumadin to 2.5mg  on Mondays and Fridays. All other days 5 mg. Recheck in 2 weeks.

## 2014-07-13 NOTE — Progress Notes (Signed)
Pre visit review using our clinic review tool, if applicable. No additional management support is needed unless otherwise documented below in the visit note. 

## 2014-07-31 ENCOUNTER — Ambulatory Visit (INDEPENDENT_AMBULATORY_CARE_PROVIDER_SITE_OTHER): Payer: Medicare Other | Admitting: Family

## 2014-07-31 ENCOUNTER — Encounter: Payer: Self-pay | Admitting: Internal Medicine

## 2014-07-31 DIAGNOSIS — Z5181 Encounter for therapeutic drug level monitoring: Secondary | ICD-10-CM

## 2014-07-31 DIAGNOSIS — I482 Chronic atrial fibrillation, unspecified: Secondary | ICD-10-CM

## 2014-07-31 LAB — POCT INR: INR: 2.6

## 2014-07-31 NOTE — Patient Instructions (Signed)
Continue coumadin to 2.5mg  on Mondays and Fridays. All other days 5 mg. Recheck in 4 weeks.  Anticoagulation Dose Instructions as of 07/31/2014      Dorene Grebe Tue Wed Thu Fri Sat   New Dose 5 mg 2.5 mg 5 mg 5 mg 5 mg 2.5 mg 5 mg    Description        Continue coumadin to 2.5mg  on Mondays and Fridays. All other days 5 mg. Recheck in 4 weeks.

## 2014-08-09 ENCOUNTER — Other Ambulatory Visit: Payer: Self-pay | Admitting: Nurse Practitioner

## 2014-08-11 ENCOUNTER — Telehealth: Payer: Self-pay | Admitting: Nurse Practitioner

## 2014-08-11 NOTE — Telephone Encounter (Signed)
Pt needs refill [facility] to [facility] hydrazyline 25 mg 40 days is all they will cover , will be changing drug companies the 1st of the year

## 2014-08-22 ENCOUNTER — Encounter: Payer: Self-pay | Admitting: Podiatry

## 2014-08-22 ENCOUNTER — Ambulatory Visit (INDEPENDENT_AMBULATORY_CARE_PROVIDER_SITE_OTHER): Payer: Medicare Other | Admitting: Podiatry

## 2014-08-22 DIAGNOSIS — B351 Tinea unguium: Secondary | ICD-10-CM

## 2014-08-22 DIAGNOSIS — E0842 Diabetes mellitus due to underlying condition with diabetic polyneuropathy: Secondary | ICD-10-CM

## 2014-08-22 DIAGNOSIS — I2510 Atherosclerotic heart disease of native coronary artery without angina pectoris: Secondary | ICD-10-CM

## 2014-08-22 DIAGNOSIS — E1140 Type 2 diabetes mellitus with diabetic neuropathy, unspecified: Secondary | ICD-10-CM | POA: Insufficient documentation

## 2014-08-22 LAB — HM DIABETES EYE EXAM

## 2014-08-22 NOTE — Patient Instructions (Signed)
Seen for hypertrophic nails. All nails debrided. Return in 3 months or as needed.  

## 2014-08-22 NOTE — Progress Notes (Signed)
Subjective:  80 y.o. year old male IDDM with HgA1c being 8 about a month ago. Requesting toe nails trimmed today.  Denies any other problems.   Objective:  Dermatologic:  Thick dystrophic nails x 10. Skin: No skin lesions.  Vascular:  Dorsalis pedis pulses palpable on both feet.  Posterior tibial pulses are not palpable on both feet.  No edema or erythema, no ischemic changes noted.  Orthopedic:  Cavus type foot without gross deformity.  Neurologic:  Failed respond to Monofilament sensory testing bilateral. Vibratory and Ankle DTR is within normal bilateral.   Assessment:  Dystrophic mycotic nails x 9.  Diabetic neuropathy.   Treatment: All mycotic nails and ingrown nails debrided.  Return in 3 months or as needed

## 2014-08-23 ENCOUNTER — Telehealth: Payer: Self-pay | Admitting: Internal Medicine

## 2014-08-23 MED ORDER — INSULIN LISPRO PROT & LISPRO (75-25 MIX) 100 UNIT/ML ~~LOC~~ SUSP: 26.0000 [IU] | Freq: Two times a day (BID) | SUBCUTANEOUS | Status: DC

## 2014-08-23 NOTE — Telephone Encounter (Signed)
Spoke to pt, told him I can send refill to [facility]. Pt said that is fine, I need two vials to make it through the month and when I come in later in the month I will be changing pharmacy and will give you info then.Told him okay. Rx sent to pharmacy.

## 2014-08-23 NOTE — Telephone Encounter (Signed)
Patient states his insulin lispro protamine-lispro (HUMALOG 75/25 MIX) (75-25) 100 UNIT/ML SUSP injection sent to [facility] was only for 1 vial and he wants you to send in 2 vials to [facility] [place], [place].  He wants to talk to you before you send in the request about the error with the original rx sent.

## 2014-08-28 ENCOUNTER — Ambulatory Visit (INDEPENDENT_AMBULATORY_CARE_PROVIDER_SITE_OTHER): Payer: Medicare Other | Admitting: Family

## 2014-08-28 DIAGNOSIS — Z5181 Encounter for therapeutic drug level monitoring: Secondary | ICD-10-CM

## 2014-08-28 DIAGNOSIS — I482 Chronic atrial fibrillation, unspecified: Secondary | ICD-10-CM

## 2014-08-28 LAB — POCT INR: INR: 2.7

## 2014-08-28 NOTE — Patient Instructions (Signed)
Continue coumadin to 2.5mg  on Mondays and Fridays. All other days 5 mg. Recheck in 4 weeks.  Anticoagulation Dose Instructions as of 08/28/2014      Dorene Grebe Tue Wed Thu Fri Sat   New Dose 5 mg 2.5 mg 5 mg 5 mg 5 mg 2.5 mg 5 mg    Description        Continue coumadin to 2.5mg  on Mondays and Fridays. All other days 5 mg. Recheck in 4 weeks.

## 2014-08-29 ENCOUNTER — Encounter: Payer: Self-pay | Admitting: Internal Medicine

## 2014-08-31 ENCOUNTER — Encounter (HOSPITAL_COMMUNITY): Payer: Self-pay | Admitting: Internal Medicine

## 2014-09-12 ENCOUNTER — Ambulatory Visit (INDEPENDENT_AMBULATORY_CARE_PROVIDER_SITE_OTHER): Payer: Medicare Other | Admitting: Internal Medicine

## 2014-09-12 ENCOUNTER — Encounter: Payer: Self-pay | Admitting: Internal Medicine

## 2014-09-12 ENCOUNTER — Other Ambulatory Visit: Payer: Self-pay | Admitting: *Deleted

## 2014-09-12 VITALS — BP 130/68 | HR 72 | Temp 97.9°F | Resp 20 | Ht 72.0 in | Wt 181.0 lb

## 2014-09-12 VITALS — BP 160/62 | HR 70 | Ht 72.0 in | Wt 181.0 lb

## 2014-09-12 DIAGNOSIS — N184 Chronic kidney disease, stage 4 (severe): Secondary | ICD-10-CM

## 2014-09-12 DIAGNOSIS — I5043 Acute on chronic combined systolic (congestive) and diastolic (congestive) heart failure: Secondary | ICD-10-CM

## 2014-09-12 DIAGNOSIS — I482 Chronic atrial fibrillation, unspecified: Secondary | ICD-10-CM

## 2014-09-12 DIAGNOSIS — IMO0002 Reserved for concepts with insufficient information to code with codable children: Secondary | ICD-10-CM

## 2014-09-12 DIAGNOSIS — I10 Essential (primary) hypertension: Secondary | ICD-10-CM

## 2014-09-12 DIAGNOSIS — I2510 Atherosclerotic heart disease of native coronary artery without angina pectoris: Secondary | ICD-10-CM

## 2014-09-12 DIAGNOSIS — I429 Cardiomyopathy, unspecified: Secondary | ICD-10-CM

## 2014-09-12 DIAGNOSIS — I5020 Unspecified systolic (congestive) heart failure: Secondary | ICD-10-CM

## 2014-09-12 DIAGNOSIS — Z95810 Presence of automatic (implantable) cardiac defibrillator: Secondary | ICD-10-CM

## 2014-09-12 DIAGNOSIS — I129 Hypertensive chronic kidney disease with stage 1 through stage 4 chronic kidney disease, or unspecified chronic kidney disease: Secondary | ICD-10-CM

## 2014-09-12 DIAGNOSIS — E1165 Type 2 diabetes mellitus with hyperglycemia: Secondary | ICD-10-CM

## 2014-09-12 DIAGNOSIS — I442 Atrioventricular block, complete: Secondary | ICD-10-CM

## 2014-09-12 DIAGNOSIS — N189 Chronic kidney disease, unspecified: Secondary | ICD-10-CM

## 2014-09-12 DIAGNOSIS — Z4502 Encounter for adjustment and management of automatic implantable cardiac defibrillator: Secondary | ICD-10-CM

## 2014-09-12 LAB — HEMOGLOBIN A1C: Hgb A1c MFr Bld: 8.7 % — ABNORMAL HIGH (ref 4.6–6.5)

## 2014-09-12 LAB — MDC_IDC_ENUM_SESS_TYPE_INCLINIC
Battery Remaining Longevity: 24 mo
Brady Statistic RA Percent Paced: 0 %
Brady Statistic RV Percent Paced: 92 %
HighPow Impedance: 39.1275
Lead Channel Pacing Threshold Amplitude: 1 V
Lead Channel Pacing Threshold Amplitude: 1 V
Lead Channel Pacing Threshold Amplitude: 1.75 V
Lead Channel Pacing Threshold Pulse Width: 0.6 ms
Lead Channel Pacing Threshold Pulse Width: 0.6 ms
Lead Channel Pacing Threshold Pulse Width: 1 ms
Lead Channel Sensing Intrinsic Amplitude: 12 mV
Lead Channel Setting Pacing Amplitude: 2.5 V
Lead Channel Setting Pacing Amplitude: 3.125
Lead Channel Setting Pacing Pulse Width: 0.6 ms
Lead Channel Setting Pacing Pulse Width: 1 ms
Lead Channel Setting Sensing Sensitivity: 0.5 mV
MDC IDC MSMT LEADCHNL LV IMPEDANCE VALUE: 425 Ohm
MDC IDC MSMT LEADCHNL LV PACING THRESHOLD AMPLITUDE: 1.75 V
MDC IDC MSMT LEADCHNL LV PACING THRESHOLD PULSEWIDTH: 1 ms
MDC IDC MSMT LEADCHNL RV IMPEDANCE VALUE: 375 Ohm
MDC IDC PG SERIAL: 595859
MDC IDC SESS DTM: 20151222095613
MDC IDC SET ZONE DETECTION INTERVAL: 250 ms
MDC IDC SET ZONE DETECTION INTERVAL: 300 ms

## 2014-09-12 MED ORDER — POTASSIUM CHLORIDE CRYS ER 20 MEQ PO TBCR: 20.0000 meq | EXTENDED_RELEASE_TABLET | Freq: Two times a day (BID) | ORAL | Status: DC

## 2014-09-12 MED ORDER — DIGOXIN 125 MCG PO TABS: 0.1250 mg | ORAL_TABLET | ORAL | Status: DC

## 2014-09-12 MED ORDER — METOPROLOL SUCCINATE ER 100 MG PO TB24: 100.0000 mg | ORAL_TABLET | Freq: Two times a day (BID) | ORAL | Status: DC

## 2014-09-12 MED ORDER — FUROSEMIDE 80 MG PO TABS: 80.0000 mg | ORAL_TABLET | Freq: Two times a day (BID) | ORAL | Status: DC

## 2014-09-12 MED ORDER — INSULIN LISPRO PROT & LISPRO (75-25 MIX) 100 UNIT/ML ~~LOC~~ SUSP: 26.0000 [IU] | Freq: Two times a day (BID) | SUBCUTANEOUS | Status: DC

## 2014-09-12 MED ORDER — WARFARIN SODIUM 5 MG PO TABS: ORAL_TABLET | ORAL | Status: DC

## 2014-09-12 MED ORDER — ISOSORBIDE MONONITRATE ER 30 MG PO TB24: 15.0000 mg | ORAL_TABLET | Freq: Every day | ORAL | Status: DC

## 2014-09-12 MED ORDER — HYDRALAZINE HCL 25 MG PO TABS: ORAL_TABLET | ORAL | Status: DC

## 2014-09-12 NOTE — Progress Notes (Signed)
Pre visit review using our clinic review tool, if applicable. No additional management support is needed unless otherwise documented below in the visit note. 

## 2014-09-12 NOTE — Patient Instructions (Signed)
Limit your sodium (Salt) intake   Please check your hemoglobin A1c every 3 months   

## 2014-09-12 NOTE — Progress Notes (Signed)
Subjective:    Patient ID: [name] [name]., male    DOB: 08-05-1931, 80 y.o.   MRN: [id number]  HPI  81 year old patient who is seen today for follow-up of type 2 diabetes.  Complications include nephropathy, and neuropathy.  He was seen by cardiology earlier today and also is followed by renal medicine.  Doing quite well.  Last hemoglobin A1c was quite elevated at 10.  Presently remains on 75/25 insulin 26 units twice daily.  Blood sugars are frequently less than 100.  He generally feels well.  No hypoglycemia  Past Medical History  Diagnosis Date  . Chronic atrial fibrillation     a. Historically difficult rate control. b. s/p CRT-D implantation with anticipation of AV junction ablation but patient then was able to accomplish rate control and ablation not undertaken.; AVN ablation 08/2013 by Dr Lovena Le  . CARDIOMYOPATHY, PRIMARY, DILATED     a. Mixed ICM/NICM - EF 15% by echo 05/2009; St. Jude CRT-D implanted 06/2009. b. Echo 04/2013 - EF 20%, mild LVH.  [name] COLON CANCER, HX OF     a. s/p partial colectomy.  . Chronic systolic CHF (congestive heart failure)   . CORONARY ARTERY DISEASE     a. BMS to LAD 04/2008. b. Cath 06/2009: nonobstructive disease.  [name] PROSTATE CANCER, HX OF     a. s/p radical prostatectomy.  [name] SKIN CANCER, HX OF   . SLEEP APNEA   . Hypertension   . Hyperlipidemia   . Diabetes mellitus   . Osteoarthritis   . Tubular adenoma of colon   . Secondary hyperparathyroidism   . CKD (chronic kidney disease), stage IV     a. Right brachiocephalic AV fistula 04/1447. Neph = Dunham.  . Vertigo   . Valvular heart disease     a. Echo 04/2013: mild AI, mild MR.  . Pulmonary HTN     a. PA pressure 43mmHg by echo 05/01/13.    History   Social History  . Marital Status: Widowed    Spouse Name: N/A    Number of Children: 4  . Years of Education: N/A   Occupational History  . retired    Social History Main Topics  . Smoking status: Former Smoker    Types: Cigarettes    Quit date: 09/22/1966  . Smokeless tobacco: Never Used  . Alcohol Use: 0.0 - 0.6 oz/week    0-1 Not specified per week     Comment: occasional  . Drug Use: No  . Sexual Activity: No   Other Topics Concern  . Not on file   Social History Narrative    Past Surgical History  Procedure Laterality Date  . Colectomy    . Prostatectomy    . Penile prosthesis placement    . Removed prosthetic eye    . Ptca      stent placed  . Total knee arthroplasty  2008    right  . Pacemaker insertion  2010  . Av fistula placement  03/30/2012    Procedure: ARTERIOVENOUS (AV) FISTULA CREATION;  Surgeon: Angelia Mould, MD;  Location: [facility];  Service: Vascular;  Laterality: Right;  Creation of BrachioCephalic Fistula Right arm  . Eye surgery      left eye prothesis  . Fracture surgery      collar bone, right knee replacement  . Bi-ventricular implantable cardioverter defibrillator  (crt-d)  2010    STJ CRTD implanted by Dr [name]  . Ablation  08/2013  AVN ablation by Dr Lovena Le  . Av node ablation N/A 09/12/2013    Procedure: AV NODE ABLATION;  Surgeon: [name], MD;  Location: [facility] C[facility] LAB;  Service: Cardiovascular;  Laterality: N/A;    Family History  Problem Relation Age of Onset  . Heart disease Father   . Throat cancer Father   . Throat cancer Mother   . Heart disease Mother   . Hypertension Mother     Allergies  Allergen Reactions  . Ace Inhibitors     Stopped by Dr. Burt Knack due to progressive renal failure  . Prednisone Other (See Comments)    Raised blood sugar    Current Outpatient Prescriptions on File Prior to Visit  Medication Sig Dispense Refill  . aspirin 81 MG chewable tablet Chew 81 mg by mouth daily.    . calcitRIOL (ROCALTROL) 0.25 MCG capsule Take 0.25 mcg by mouth daily.     . digoxin (LANOXIN) 0.125 MG tablet Take 1 tablet (0.125 mg total) by mouth every other day. 45 tablet 3  . furosemide (LASIX) 80 MG tablet Take 1 tablet (80 mg total) by mouth 2  (two) times daily. 180 tablet 1  . glucose blood (FREESTYLE LITE) test strip 1 each by Other route 2 (two) times daily as needed for other. Dx: 250.00 100 each 12  . hydrALAZINE (APRESOLINE) 25 MG tablet TAKE 1 AND 1/2 TABLETS     (37.5MG  TOTAL) 3 TIMES A   DAY 180 tablet 6  . insulin lispro protamine-lispro (HUMALOG 75/25 MIX) (75-25) 100 UNIT/ML SUSP injection Inject 26 Units into the skin 2 (two) times daily with a meal. 20 mL 0  . isosorbide mononitrate (IMDUR) 30 MG 24 hr tablet Take 0.5 tablets (15 mg total) by mouth daily. 90 tablet 3  . Lancets (FREESTYLE) lancets 1 each by Other route 2 (two) times daily as needed for other. 100 each 12  . meclizine (ANTIVERT) 25 MG tablet Take 25 mg by mouth 3 (three) times daily as needed for dizziness.    . metoprolol succinate (TOPROL-XL) 100 MG 24 hr tablet Take 1 tablet (100 mg total) by mouth 2 (two) times daily. Take with or immediately following a meal. 180 tablet 3  . nitroGLYCERIN (NITROSTAT) 0.4 MG SL tablet Place 0.4 mg under the tongue every 5 (five) minutes x 3 doses as needed for chest pain.    . potassium chloride SA (K-DUR,KLOR-CON) 20 MEQ tablet Take 1 tablet (20 mEq total) by mouth 2 (two) times daily. 180 tablet 3  . warfarin (COUMADIN) 5 MG tablet Take 2.5-5 mg by mouth See admin instructions. On Monday and Friday take 2.5 mg; On Tuesday, Wednesday, Thursday, Saturday, and Sunday takes 5 mg. Rx faxed to [place].     No current facility-administered medications on file prior to visit.    BP 130/68 mmHg  Pulse 72  Temp(Src) 97.9 F (36.6 C) (Oral)  Resp 20  Ht 6' (1.829 m)  Wt 181 lb (82.101 kg)  BMI 24.54 kg/m2  SpO2 98%     Review of Systems  Constitutional: Negative for fever, chills, activity change, appetite change and fatigue.  HENT: Negative for congestion, dental problem, ear pain, hearing loss, mouth sores, rhinorrhea, sinus pressure, sneezing, tinnitus, trouble swallowing and voice change.   Eyes: Negative  for photophobia, pain, redness and visual disturbance.  Respiratory: Negative for apnea, cough, choking, chest tightness, shortness of breath and wheezing.   Cardiovascular: Negative for chest pain, palpitations and leg swelling.  Gastrointestinal: Negative for nausea, vomiting, abdominal pain, diarrhea, constipation, blood in stool, abdominal distention, anal bleeding and rectal pain.  Genitourinary: Negative for dysuria, urgency, frequency, hematuria, flank pain, decreased urine volume, discharge, penile swelling, scrotal swelling, difficulty urinating, genital sores and testicular pain.  Musculoskeletal: Negative for myalgias, back pain, joint swelling, arthralgias, gait problem, neck pain and neck stiffness.  Skin: Negative for color change, rash and wound.  Neurological: Negative for dizziness, tremors, seizures, syncope, facial asymmetry, speech difficulty, weakness, light-headedness, numbness and headaches.  Hematological: Negative for adenopathy. Does not bruise/bleed easily.  Psychiatric/Behavioral: Negative for suicidal ideas, hallucinations, behavioral problems, confusion, sleep disturbance, self-injury, dysphoric mood, decreased concentration and agitation. The patient is not nervous/anxious.        Objective:   Physical Exam  Constitutional: He is oriented to person, place, and time. He appears well-developed.  HENT:  Head: Normocephalic.  Right Ear: External ear normal.  Left Ear: External ear normal.  Eyes: Conjunctivae and EOM are normal.  Neck: Normal range of motion.  Cardiovascular: Normal rate and normal heart sounds.   Irregular rhythm with controlled ventricular response  Pulmonary/Chest: Breath sounds normal.  Abdominal: Bowel sounds are normal.  Musculoskeletal: Normal range of motion. He exhibits no edema or tenderness.  Neurological: He is alert and oriented to person, place, and time.  Psychiatric: He has a normal mood and affect. His behavior is normal.           Assessment & Plan:   Diabetes mellitus.  Will check a hemoglobin A1c Permanent atrial fibrillation Cardiomyopathy, stable Hypertension-  continue present regimen  , Cardiology and renal follow-up Recheck here 3 months

## 2014-09-12 NOTE — Patient Instructions (Addendum)
Your physician recommends that you continue on your current medications as directed. Please refer to the Current Medication list given to you today.  Remote monitoring is used to monitor your ICD from home. This monitoring reduces the number of office visits required to check your device to one time per year. It allows [place] to keep an eye on the functioning of your device to ensure it is working properly. You are scheduled for a device check from home on 12-12-2014. You may send your transmission at any time that day. If you have a wireless device, the transmission will be sent automatically. After your physician reviews your transmission, you will receive a postcard with your next transmission date.  Your physician recommends that you schedule a follow-up appointment in: 12 months with Dr.Klein  Follow up with your primary care physician about your blood pressure.

## 2014-09-12 NOTE — Progress Notes (Signed)
Patient Care Team: [name], MD as PCP - General (Internal Medicine)   HPI  [name] [name]. is a 80 y.o. male seen in followup for congestive heart failure in the setting of atrial fibrillation with a rapid ventricular response and associated cardiomyopathy with both ischemic and nonischemic components.  He is status post CRT-D implantation with Anticipation of AV junction ablation. However, we were then able to accomplish rate control And ablation was not undertaken. He subsequently developed heart failure again with a rapid ventricular rate and underwent AV junction ablation last fall.  Cardiac catheterization in OCT 2010 Nonischemic cardiomyopathy with reduced cardiac output 2. Continued patency of the left anterior descending artery at site of previous stenting 3. Scattered noncritical disease ; left ventricular function out of proportion to the extent of coronary disease.  Echo 2011 demonstrated EF 30-35%    He has some dyspnea on exertion with stairs but is able to get around on the flat relatively well.  Blood pressure he says at home runs in the 140 range   Past Medical History  Diagnosis Date  . Chronic atrial fibrillation     a. Historically difficult rate control. b. s/p CRT-D implantation with anticipation of AV junction ablation but patient then was able to accomplish rate control and ablation not undertaken.; AVN ablation 08/2013 by Dr Lovena Le  . CARDIOMYOPATHY, PRIMARY, DILATED     a. Mixed ICM/NICM - EF 15% by echo 05/2009; St. Jude CRT-D implanted 06/2009. b. Echo 04/2013 - EF 20%, mild LVH.  [name] COLON CANCER, HX OF     a. s/p partial colectomy.  . Chronic systolic CHF (congestive heart failure)   . CORONARY ARTERY DISEASE     a. BMS to LAD 04/2008. b. Cath 06/2009: nonobstructive disease.  [name] PROSTATE CANCER, HX OF     a. s/p radical prostatectomy.  [name] SKIN CANCER, HX OF   . SLEEP APNEA   . Hypertension   . Hyperlipidemia   . Diabetes mellitus   .  Osteoarthritis   . Tubular adenoma of colon   . Secondary hyperparathyroidism   . CKD (chronic kidney disease), stage IV     a. Right brachiocephalic AV fistula 01/931. Neph = Dunham.  . Vertigo   . Valvular heart disease     a. Echo 04/2013: mild AI, mild MR.  . Pulmonary HTN     a. PA pressure 37mmHg by echo 05/01/13.    Past Surgical History  Procedure Laterality Date  . Colectomy    . Prostatectomy    . Penile prosthesis placement    . Removed prosthetic eye    . Ptca      stent placed  . Total knee arthroplasty  2008    right  . Pacemaker insertion  2010  . Av fistula placement  03/30/2012    Procedure: ARTERIOVENOUS (AV) FISTULA CREATION;  Surgeon: Angelia Mould, MD;  Location: [facility];  Service: Vascular;  Laterality: Right;  Creation of BrachioCephalic Fistula Right arm  . Eye surgery      left eye prothesis  . Fracture surgery      collar bone, right knee replacement  . Bi-ventricular implantable cardioverter defibrillator  (crt-d)  2010    STJ CRTD implanted by Dr [name]  . Ablation  08/2013    AVN ablation by Dr Lovena Le  . Av node ablation N/A 09/12/2013    Procedure: AV NODE ABLATION;  Surgeon: [name], MD;  Location:  [facility] CATH LAB;  Service: Cardiovascular;  Laterality: N/A;    Current Outpatient Prescriptions  Medication Sig Dispense Refill  . aspirin 81 MG chewable tablet Chew 81 mg by mouth daily.    . calcitRIOL (ROCALTROL) 0.25 MCG capsule Take 0.25 mcg by mouth daily.     . digoxin (LANOXIN) 0.125 MG tablet Take 1 tablet (0.125 mg total) by mouth every other day. 45 tablet 3  . furosemide (LASIX) 80 MG tablet Take 1 tablet (80 mg total) by mouth 2 (two) times daily. 180 tablet 1  . glucose blood (FREESTYLE LITE) test strip 1 each by Other route 2 (two) times daily as needed for other. Dx: 250.00 100 each 12  . hydrALAZINE (APRESOLINE) 25 MG tablet TAKE 1 AND 1/2 TABLETS     (37.5MG  TOTAL) 3 TIMES A   DAY 180 tablet 6  . insulin lispro  protamine-lispro (HUMALOG 75/25 MIX) (75-25) 100 UNIT/ML SUSP injection Inject 26 Units into the skin 2 (two) times daily with a meal. 20 mL 0  . isosorbide mononitrate (IMDUR) 30 MG 24 hr tablet Take 0.5 tablets (15 mg total) by mouth daily. 90 tablet 3  . Lancets (FREESTYLE) lancets 1 each by Other route 2 (two) times daily as needed for other. 100 each 12  . metoprolol succinate (TOPROL-XL) 100 MG 24 hr tablet Take 1 tablet (100 mg total) by mouth 2 (two) times daily. Take with or immediately following a meal. 180 tablet 3  . nitroGLYCERIN (NITROSTAT) 0.4 MG SL tablet Place 0.4 mg under the tongue every 5 (five) minutes x 3 doses as needed for chest pain.    . potassium chloride SA (K-DUR,KLOR-CON) 20 MEQ tablet Take 1 tablet (20 mEq total) by mouth 2 (two) times daily. 180 tablet 3  . warfarin (COUMADIN) 5 MG tablet Take 2.5-5 mg by mouth See admin instructions. On Monday and Friday take 2.5 mg; On Tuesday, Wednesday, Thursday, Saturday, and Sunday takes 5 mg. Rx faxed to [place].    . meclizine (ANTIVERT) 25 MG tablet Take 25 mg by mouth 3 (three) times daily as needed for dizziness.     No current facility-administered medications for this visit.    Allergies  Allergen Reactions  . Ace Inhibitors     Stopped by Dr. Burt Knack due to progressive renal failure  . Prednisone Other (See Comments)    Raised blood sugar    Review of Systems negative except from HPI and PMH  Physical Exam Ht 6' (1.829 m)  Wt 181 lb (82.101 kg)  BMI 24.54 kg/m2 Well developed and nourished in no acute distress HENT normal Neck supple with JVP-flat Carotids brisk and full without bruits Clear Irregularly irregular rate and rhythm with controlled entricular response, no murmurs or gallops Abd-soft with active BS without hepatomegaly No Clubbing cyanosis edema Skin-warm and dry A & Oriented  Grossly normal sensory and motor function  ECG demonstrates atrial fibrillation with underlying ventricular  pacing and PVCs  Assessment and  Plan  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 Jude The patient's device was interrogated and the information was fully reviewed.  The device was reprogrammed to activate rate response and increased a daytime rate from 70--75 and activate the rest rate.   Patient's functional intolerance may be in part related to inappropriate programming  with the absence of rate response. We have activated that today.  Blood pressure is poorly controlled.; He says at home it's better. I will have him follow-up with his PCP and Dr. Lorrene Reid regarding augmented blood pressure control. I would've increase his hydralazine today from 37.5--50 3 times a day.  Without symptoms of ischemia  Euvolemic continue current meds No changes

## 2014-09-13 ENCOUNTER — Encounter: Payer: Self-pay | Admitting: Internal Medicine

## 2014-09-20 ENCOUNTER — Encounter: Payer: Self-pay | Admitting: Family Medicine

## 2014-09-20 ENCOUNTER — Ambulatory Visit (INDEPENDENT_AMBULATORY_CARE_PROVIDER_SITE_OTHER): Payer: Medicare Other | Admitting: Family Medicine

## 2014-09-20 VITALS — BP 120/60 | HR 80 | Temp 97.4°F | Ht 72.0 in | Wt 180.2 lb

## 2014-09-20 DIAGNOSIS — J069 Acute upper respiratory infection, unspecified: Secondary | ICD-10-CM

## 2014-09-20 DIAGNOSIS — I2510 Atherosclerotic heart disease of native coronary artery without angina pectoris: Secondary | ICD-10-CM

## 2014-09-20 NOTE — Progress Notes (Signed)
Pre visit review using our clinic review tool, if applicable. No additional management support is needed unless otherwise documented below in the visit note. 

## 2014-09-20 NOTE — Patient Instructions (Signed)
-  flonase daily for 21 days  -nasal saline  -follow up as needed if worsening or persists

## 2014-09-20 NOTE — Progress Notes (Addendum)
HPI:   [name] [name]. is an 80 yo patient of Dr. Raliegh Ip with chronic a. Fib, CHF, CAD, cardiomyopathy, uncontrolled diabetes, uncontrolled HTN, renal disease followed closely by his cardiologist and nephrologist and PCP, here for and acute visit for:  Cough and congestion: -started: 6 days ago -symptoms:nasal congestion, PND, sore throat, sneezing, cough -denies:fever, no change in baseline SOB, CP, palpitations, NVD, tooth pain, weight gain, swelling -has tried: musinex -sick contacts/travel/risks: denies flu exposure, tick exposure or or Ebola risks -Hx of: allergies - reports does not take anything for this other then bee honey -hx pneumonia and worried about this   ROS: See pertinent positives and negatives per HPI.  Past Medical History  Diagnosis Date  . Chronic atrial fibrillation     a. Historically difficult rate control. b. s/p CRT-D implantation with anticipation of AV junction ablation but patient then was able to accomplish rate control and ablation not undertaken.; AVN ablation 08/2013 by Dr Lovena Le  . CARDIOMYOPATHY, PRIMARY, DILATED     a. Mixed ICM/NICM - EF 15% by echo 05/2009; St. Jude CRT-D implanted 06/2009. b. Echo 04/2013 - EF 20%, mild LVH.  [name] COLON CANCER, HX OF     a. s/p partial colectomy.  . Chronic systolic CHF (congestive heart failure)   . CORONARY ARTERY DISEASE     a. BMS to LAD 04/2008. b. Cath 06/2009: nonobstructive disease.  [name] PROSTATE CANCER, HX OF     a. s/p radical prostatectomy.  [name] SKIN CANCER, HX OF   . SLEEP APNEA   . Hypertension   . Hyperlipidemia   . Diabetes mellitus   . Osteoarthritis   . Tubular adenoma of colon   . Secondary hyperparathyroidism   . CKD (chronic kidney disease), stage IV     a. Right brachiocephalic AV fistula 09/9377. Neph = Dunham.  . Vertigo   . Valvular heart disease     a. Echo 04/2013: mild AI, mild MR.  . Pulmonary HTN     a. PA pressure 24mmHg by echo 05/01/13.    Past Surgical History  Procedure  Laterality Date  . Colectomy    . Prostatectomy    . Penile prosthesis placement    . Removed prosthetic eye    . Ptca      stent placed  . Total knee arthroplasty  2008    right  . Pacemaker insertion  2010  . Av fistula placement  03/30/2012    Procedure: ARTERIOVENOUS (AV) FISTULA CREATION;  Surgeon: Angelia Mould, MD;  Location: [place];  Service: Vascular;  Laterality: Right;  Creation of BrachioCephalic Fistula Right arm  . Eye surgery      left eye prothesis  . Fracture surgery      collar bone, right knee replacement  . Bi-ventricular implantable cardioverter defibrillator  (crt-d)  2010    STJ CRTD implanted by Dr [name]  . Ablation  08/2013    AVN ablation by Dr Lovena Le  . Av node ablation N/A 09/12/2013    Procedure: AV NODE ABLATION;  Surgeon: [name], MD;  Location: [facility] CATH LAB;  Service: Cardiovascular;  Laterality: N/A;    Family History  Problem Relation Age of Onset  . Heart disease Father   . Throat cancer Father   . Throat cancer Mother   . Heart disease Mother   . Hypertension Mother     History   Social History  . Marital Status: Widowed    Spouse Name: N/A  Number of Children: 4  . Years of Education: N/A   Occupational History  . retired    Social History Main Topics  . Smoking status: Former Smoker    Types: Cigarettes    Quit date: 09/22/1966  . Smokeless tobacco: Never Used  . Alcohol Use: 0.0 - 0.6 oz/week    0-1 Not specified per week     Comment: occasional  . Drug Use: No  . Sexual Activity: No   Other Topics Concern  . None   Social History Narrative    Current outpatient prescriptions: aspirin 81 MG chewable tablet, Chew 81 mg by mouth daily., Disp: , Rfl: ;  calcitRIOL (ROCALTROL) 0.25 MCG capsule, Take 0.25 mcg by mouth daily. , Disp: , Rfl: ;  digoxin (LANOXIN) 0.125 MG tablet, Take 1 tablet (0.125 mg total) by mouth every other day., Disp: 45 tablet, Rfl: 1;  furosemide (LASIX) 80 MG tablet, Take 1 tablet (80  mg total) by mouth 2 (two) times daily., Disp: 180 tablet, Rfl: 1 glucose blood (FREESTYLE LITE) test strip, 1 each by Other route 2 (two) times daily as needed for other. Dx: 250.00, Disp: 100 each, Rfl: 12;  hydrALAZINE (APRESOLINE) 25 MG tablet, TAKE 1 AND 1/2 TABLETS     (37.5MG  TOTAL) 3 TIMES A   DAY, Disp: 180 tablet, Rfl: 1;  insulin lispro protamine-lispro (HUMALOG 75/25 MIX) (75-25) 100 UNIT/ML SUSP injection, Inject 26 Units into the skin 2 (two) times daily with a meal., Disp: 6 vial, Rfl: 1 isosorbide mononitrate (IMDUR) 30 MG 24 hr tablet, Take 0.5 tablets (15 mg total) by mouth daily., Disp: 90 tablet, Rfl: 1;  Lancets (FREESTYLE) lancets, 1 each by Other route 2 (two) times daily as needed for other., Disp: 100 each, Rfl: 12;  metoprolol succinate (TOPROL-XL) 100 MG 24 hr tablet, Take 1 tablet (100 mg total) by mouth 2 (two) times daily. Take with or immediately following a meal., Disp: 180 tablet, Rfl: 1 nitroGLYCERIN (NITROSTAT) 0.4 MG SL tablet, Place 0.4 mg under the tongue every 5 (five) minutes x 3 doses as needed for chest pain., Disp: , Rfl: ;  potassium chloride SA (K-DUR,KLOR-CON) 20 MEQ tablet, Take 1 tablet (20 mEq total) by mouth 2 (two) times daily., Disp: 180 tablet, Rfl: 3;  warfarin (COUMADIN) 5 MG tablet, On Monday and Friday take 2.5 mg; On Tuesday, Wednesday, Thursday, Saturday, and Sunday takes 5 mg., Disp: 90 tablet, Rfl: 1  EXAM:  Filed Vitals:   09/20/14 0854  BP: 120/60  Pulse: 80  Temp: 97.4 F (36.3 C)    Body mass index is 24.43 kg/(m^2).  GENERAL: vitals reviewed and listed above, alert, oriented, appears well hydrated and in no acute distress  HEENT: atraumatic, conjunttiva clear, no obvious abnormalities on inspection of external nose and ears, normal appearance of ear canals and TMs, clear nasal congestion, mild post oropharyngeal erythema with PND, no tonsillar edema or exudate, no sinus TTP  NECK: no obvious masses on inspection  LUNGS: clear to  auscultation bilaterally, no wheezes, rales or rhonchi, good air movement  CV: HRRR, no peripheral edema  MS: moves all extremities without noticeable abnormality  PSYCH: pleasant and cooperative, no obvious depression or anxiety  ASSESSMENT AND PLAN:  Discussed the following assessment and plan:  Acute upper respiratory infection  -given HPI and exam findings today, a serious infection or illness is unlikely. We discussed potential etiologies, with VURI or AR being most likely, and advised supportive care and monitoring. We discussed treatment  side effects, likely course, antibiotic misuse, transmission, and signs of developing a serious illness. Offered CXR for his concerns though lungs sounded good today - he declined. -of course, we advised to return or notify a doctor immediately if symptoms worsen or persist or new concerns arise.    Patient Instructions  -flonase daily for 21 days  -nasal saline  -follow up as needed if worsening or persists     Momodou Consiglio R.

## 2014-09-25 ENCOUNTER — Ambulatory Visit (INDEPENDENT_AMBULATORY_CARE_PROVIDER_SITE_OTHER)
Admission: RE | Admit: 2014-09-25 | Discharge: 2014-09-25 | Disposition: A | Discharge status: Home / Self Care | Payer: Medicare Other | Source: Ambulatory Visit | Attending: Family Medicine | Admitting: Family Medicine

## 2014-09-25 ENCOUNTER — Encounter: Payer: Self-pay | Admitting: Family Medicine

## 2014-09-25 ENCOUNTER — Ambulatory Visit (INDEPENDENT_AMBULATORY_CARE_PROVIDER_SITE_OTHER): Payer: Medicare Other | Admitting: Family Medicine

## 2014-09-25 VITALS — BP 138/72 | HR 74 | Temp 97.9°F | Ht 72.0 in | Wt 175.6 lb

## 2014-09-25 DIAGNOSIS — J069 Acute upper respiratory infection, unspecified: Secondary | ICD-10-CM

## 2014-09-25 MED ORDER — BENZONATATE 100 MG PO CAPS: 100.0000 mg | ORAL_CAPSULE | Freq: Two times a day (BID) | ORAL | Status: DC | PRN

## 2014-09-25 NOTE — Patient Instructions (Signed)
BEFORE YOU LEAVE: -schedule follow up with Dr. Burnice Logan in 1 week -xray  Go get the chest xray  Try afrin nasal spray for 4 days - then stop  Try the tessalon cough medication

## 2014-09-25 NOTE — Progress Notes (Signed)
Pre visit review using our clinic review tool, if applicable. No additional management support is needed unless otherwise documented below in the visit note. 

## 2014-09-25 NOTE — Progress Notes (Signed)
HPI:  -started: about 8 days ago, seen 12/30 with several days of nasal congestion, PND, sneezing, cough and tx for viral/vs allergic rhinosinusitis - a bit better but cough, PND has persisted -reports: feels about the same and feels like energy and appetite down -denies:fever, SOB, NVD, tooth pain -has tried: flonase and cough medication -sick contacts/travel/risks: denies flu exposure, tick exposure or or Ebola risks  ROS: See pertinent positives and negatives per HPI.  Past Medical History  Diagnosis Date  . Chronic atrial fibrillation     a. Historically difficult rate control. b. s/p CRT-D implantation with anticipation of AV junction ablation but patient then was able to accomplish rate control and ablation not undertaken.; AVN ablation 08/2013 by Dr Lovena Le  . CARDIOMYOPATHY, PRIMARY, DILATED     a. Mixed ICM/NICM - EF 15% by echo 05/2009; St. Jude CRT-D implanted 06/2009. b. Echo 04/2013 - EF 20%, mild LVH.  [name] COLON CANCER, HX OF     a. s/p partial colectomy.  . Chronic systolic CHF (congestive heart failure)   . CORONARY ARTERY DISEASE     a. BMS to LAD 04/2008. b. Cath 06/2009: nonobstructive disease.  [name] PROSTATE CANCER, HX OF     a. s/p radical prostatectomy.  [name] SKIN CANCER, HX OF   . SLEEP APNEA   . Hypertension   . Hyperlipidemia   . Diabetes mellitus   . Osteoarthritis   . Tubular adenoma of colon   . Secondary hyperparathyroidism   . CKD (chronic kidney disease), stage IV     a. Right brachiocephalic AV fistula 05/5637. Neph = Dunham.  . Vertigo   . Valvular heart disease     a. Echo 04/2013: mild AI, mild MR.  . Pulmonary HTN     a. PA pressure 41mmHg by echo 05/01/13.    Past Surgical History  Procedure Laterality Date  . Colectomy    . Prostatectomy    . Penile prosthesis placement    . Removed prosthetic eye    . Ptca      stent placed  . Total knee arthroplasty  2008    right  . Pacemaker insertion  2010  . Av fistula placement  03/30/2012   Procedure: ARTERIOVENOUS (AV) FISTULA CREATION;  Surgeon: Angelia Mould, MD;  Location: [place];  Service: Vascular;  Laterality: Right;  Creation of BrachioCephalic Fistula Right arm  . Eye surgery      left eye prothesis  . Fracture surgery      collar bone, right knee replacement  . Bi-ventricular implantable cardioverter defibrillator  (crt-d)  2010    STJ CRTD implanted by Dr [name]  . Ablation  08/2013    AVN ablation by Dr Lovena Le  . Av node ablation N/A 09/12/2013    Procedure: AV NODE ABLATION;  Surgeon: [name], MD;  Location: [facility] CATH LAB;  Service: Cardiovascular;  Laterality: N/A;    Family History  Problem Relation Age of Onset  . Heart disease Father   . Throat cancer Father   . Throat cancer Mother   . Heart disease Mother   . Hypertension Mother     History   Social History  . Marital Status: Widowed    Spouse Name: N/A    Number of Children: 4  . Years of Education: N/A   Occupational History  . retired    Social History Main Topics  . Smoking status: Former Smoker    Types: Cigarettes    Quit date:  09/22/1966  . Smokeless tobacco: Never Used  . Alcohol Use: 0.0 - 0.6 oz/week    0-1 Not specified per week     Comment: occasional  . Drug Use: No  . Sexual Activity: No   Other Topics Concern  . None   Social History Narrative    Current outpatient prescriptions: aspirin 81 MG chewable tablet, Chew 81 mg by mouth daily., Disp: , Rfl: ;  calcitRIOL (ROCALTROL) 0.25 MCG capsule, Take 0.25 mcg by mouth daily. , Disp: , Rfl: ;  digoxin (LANOXIN) 0.125 MG tablet, Take 1 tablet (0.125 mg total) by mouth every other day., Disp: 45 tablet, Rfl: 1;  furosemide (LASIX) 80 MG tablet, Take 1 tablet (80 mg total) by mouth 2 (two) times daily., Disp: 180 tablet, Rfl: 1 glucose blood (FREESTYLE LITE) test strip, 1 each by Other route 2 (two) times daily as needed for other. Dx: 250.00, Disp: 100 each, Rfl: 12;  hydrALAZINE (APRESOLINE) 25 MG tablet, TAKE 1  AND 1/2 TABLETS     (37.5MG  TOTAL) 3 TIMES A   DAY, Disp: 180 tablet, Rfl: 1;  insulin lispro protamine-lispro (HUMALOG 75/25 MIX) (75-25) 100 UNIT/ML SUSP injection, Inject 26 Units into the skin 2 (two) times daily with a meal., Disp: 6 vial, Rfl: 1 isosorbide mononitrate (IMDUR) 30 MG 24 hr tablet, Take 0.5 tablets (15 mg total) by mouth daily., Disp: 90 tablet, Rfl: 1;  Lancets (FREESTYLE) lancets, 1 each by Other route 2 (two) times daily as needed for other., Disp: 100 each, Rfl: 12;  metoprolol succinate (TOPROL-XL) 100 MG 24 hr tablet, Take 1 tablet (100 mg total) by mouth 2 (two) times daily. Take with or immediately following a meal., Disp: 180 tablet, Rfl: 1 nitroGLYCERIN (NITROSTAT) 0.4 MG SL tablet, Place 0.4 mg under the tongue every 5 (five) minutes x 3 doses as needed for chest pain., Disp: , Rfl: ;  potassium chloride SA (K-DUR,KLOR-CON) 20 MEQ tablet, Take 1 tablet (20 mEq total) by mouth 2 (two) times daily., Disp: 180 tablet, Rfl: 3;  warfarin (COUMADIN) 5 MG tablet, On Monday and Friday take 2.5 mg; On Tuesday, Wednesday, Thursday, Saturday, and Sunday takes 5 mg., Disp: 90 tablet, Rfl: 1 benzonatate (TESSALON) 100 MG capsule, Take 1 capsule (100 mg total) by mouth 2 (two) times daily as needed for cough., Disp: 20 capsule, Rfl: 0  EXAM:  Filed Vitals:   09/25/14 1348  BP: 138/72  Pulse: 74  Temp: 97.9 F (36.6 C)    Body mass index is 23.81 kg/(m^2).  GENERAL: vitals reviewed and listed above, alert, oriented, appears well hydrated and in no acute distress  HEENT: atraumatic, conjunttiva clear, no obvious abnormalities on inspection of external nose and ears, normal appearance of ear canals and TMs, lots of clear nasal congestion, mild post oropharyngeal erythema with PND, no tonsillar edema or exudate, no sinus TTP  NECK: no obvious masses on inspection  LUNGS: clear to auscultation bilaterally, no wheezes, rales or rhonchi, good air movement  CV: HRRR, no peripheral  edema  MS: moves all extremities without noticeable abnormality  PSYCH: pleasant and cooperative, no obvious depression or anxiety  ASSESSMENT AND PLAN:  Discussed the following assessment and plan:  Acute upper respiratory infection - Plan: benzonatate (TESSALON) 100 MG capsule, DG Chest 2 View  -given HPI and exam findings today, a serious infection or illness is unlikely. We discussed potential etiologies, with VURI being most likely, and advised supportive care and monitoring. We discussed treatment side effects, likely  course, antibiotic misuse, transmission, and signs of developing a serious illness. -check CXR given his hx to ensure no PNA - though exam does not suggest this -trial tessalon and short course affrin -follow up in 1 week or sooner if needed -of course, we advised to return or notify a doctor immediately if symptoms worsen or persist or new concerns arise.    Patient Instructions  BEFORE YOU LEAVE: -schedule follow up with Dr. Burnice Logan in 1 week -xray  Go get the chest xray  Try afrin nasal spray for 4 days - then stop  Try the tessalon cough medication       KIM, HANNAH R.

## 2014-10-02 ENCOUNTER — Encounter: Payer: Self-pay | Admitting: Internal Medicine

## 2014-10-02 ENCOUNTER — Ambulatory Visit (INDEPENDENT_AMBULATORY_CARE_PROVIDER_SITE_OTHER): Payer: Medicare Other | Admitting: Internal Medicine

## 2014-10-02 ENCOUNTER — Telehealth: Payer: Self-pay

## 2014-10-02 ENCOUNTER — Ambulatory Visit (INDEPENDENT_AMBULATORY_CARE_PROVIDER_SITE_OTHER): Payer: Medicare Other | Admitting: Family

## 2014-10-02 VITALS — BP 142/68 | HR 68 | Temp 98.0°F | Resp 20 | Ht 72.0 in | Wt 177.0 lb

## 2014-10-02 DIAGNOSIS — I482 Chronic atrial fibrillation, unspecified: Secondary | ICD-10-CM

## 2014-10-02 DIAGNOSIS — Z5181 Encounter for therapeutic drug level monitoring: Secondary | ICD-10-CM

## 2014-10-02 DIAGNOSIS — I10 Essential (primary) hypertension: Secondary | ICD-10-CM

## 2014-10-02 DIAGNOSIS — I5020 Unspecified systolic (congestive) heart failure: Secondary | ICD-10-CM

## 2014-10-02 DIAGNOSIS — J069 Acute upper respiratory infection, unspecified: Secondary | ICD-10-CM

## 2014-10-02 DIAGNOSIS — B9789 Other viral agents as the cause of diseases classified elsewhere: Secondary | ICD-10-CM

## 2014-10-02 LAB — POCT INR: INR: 6.3

## 2014-10-02 LAB — PROTIME-INR
INR: 6 ratio — AB (ref 0.8–1.0)
PROTHROMBIN TIME: 63.6 s — AB (ref 9.6–13.1)

## 2014-10-02 NOTE — Patient Instructions (Signed)
Hold Coumadin 01/12-01/15. Resume on Sat, 01/16 at normal dosage. (Continue coumadin to 2.5mg  on Mondays and Fridays. All other days 5 mg.)   Recheck on Monday 01/18.  Anticoagulation Dose Instructions as of 10/02/2014      Dorene Grebe Tue Wed Thu Fri Sat   New Dose 5 mg 2.5 mg 5 mg 5 mg 5 mg 2.5 mg 5 mg    Description        Hold Coumadin 01/12-01/15. Resume on Sat, 01/16 at normal dosage. (Continue coumadin to 2.5mg  on Mondays and Fridays. All other days 5 mg.)   Recheck on Monday 01/18.

## 2014-10-02 NOTE — Progress Notes (Signed)
Pre visit review using our clinic review tool, if applicable. No additional management support is needed unless otherwise documented below in the visit note. 

## 2014-10-02 NOTE — Telephone Encounter (Signed)
Hope called to inform that  Patient INR: 6.02 and PT 63.60

## 2014-10-02 NOTE — Patient Instructions (Signed)
Acute bronchitis symptoms for less than 10 days are generally not helped by antibiotics.  Take over-the-counter expectorants and cough medications such as  Mucinex DM.  Call if there is no improvement in 5 to 7 days or if  you develop worsening cough, fever, or new symptoms, such as shortness of breath or chest pain.   

## 2014-10-02 NOTE — Telephone Encounter (Signed)
Noted. Pt seen in office. [name] [name] is aware

## 2014-10-02 NOTE — Progress Notes (Signed)
Subjective:    Patient ID: [name] [name]., male    DOB: 07/01/1931, 81 y.o.   MRN: [id number]  HPI   81 year old patient who is seen today in follow-up.  He was seen 1 week ago and treated symptomatically for a viral URI. A chest x-ray was obtained and was normal. The patient still has a cough but has improved.  He has had some anorexia and some mild weight loss. He has insulin requiring diabetes. No fever or chills. Denies any shortness of breath.  He does have paroxysmal atrial fibrillation  Past Medical History  Diagnosis Date  . Chronic atrial fibrillation     a. Historically difficult rate control. b. s/p CRT-D implantation with anticipation of AV junction ablation but patient then was able to accomplish rate control and ablation not undertaken.; AVN ablation 08/2013 by Dr Lovena Le  . CARDIOMYOPATHY, PRIMARY, DILATED     a. Mixed ICM/NICM - EF 15% by echo 05/2009; St. Jude CRT-D implanted 06/2009. b. Echo 04/2013 - EF 20%, mild LVH.  [name] COLON CANCER, HX OF     a. s/p partial colectomy.  . Chronic systolic CHF (congestive heart failure)   . CORONARY ARTERY DISEASE     a. BMS to LAD 04/2008. b. Cath 06/2009: nonobstructive disease.  [name] PROSTATE CANCER, HX OF     a. s/p radical prostatectomy.  [name] SKIN CANCER, HX OF   . SLEEP APNEA   . Hypertension   . Hyperlipidemia   . Diabetes mellitus   . Osteoarthritis   . Tubular adenoma of colon   . Secondary hyperparathyroidism   . CKD (chronic kidney disease), stage IV     a. Right brachiocephalic AV fistula 03/16. Neph = Dunham.  . Vertigo   . Valvular heart disease     a. Echo 04/2013: mild AI, mild MR.  . Pulmonary HTN     a. PA pressure 63mmHg by echo 05/01/13.    History   Social History  . Marital Status: Widowed    Spouse Name: N/A    Number of Children: 4  . Years of Education: N/A   Occupational History  . retired    Social History Main Topics  . Smoking status: Former Smoker    Types: Cigarettes    Quit date:  09/22/1966  . Smokeless tobacco: Never Used  . Alcohol Use: 0.0 - 0.6 oz/week    0-1 Not specified per week     Comment: occasional  . Drug Use: No  . Sexual Activity: No   Other Topics Concern  . Not on file   Social History Narrative    Past Surgical History  Procedure Laterality Date  . Colectomy    . Prostatectomy    . Penile prosthesis placement    . Removed prosthetic eye    . Ptca      stent placed  . Total knee arthroplasty  2008    right  . Pacemaker insertion  2010  . Av fistula placement  03/30/2012    Procedure: ARTERIOVENOUS (AV) FISTULA CREATION;  Surgeon: Angelia Mould, MD;  Location: [place];  Service: Vascular;  Laterality: Right;  Creation of BrachioCephalic Fistula Right arm  . Eye surgery      left eye prothesis  . Fracture surgery      collar bone, right knee replacement  . Bi-ventricular implantable cardioverter defibrillator  (crt-d)  2010    STJ CRTD implanted by Dr [name]  . Ablation  08/2013  AVN ablation by Dr Lovena Le  . Av node ablation N/A 09/12/2013    Procedure: AV NODE ABLATION;  Surgeon: [name], MD;  Location: [facility] CATH LAB;  Service: Cardiovascular;  Laterality: N/A;    Family History  Problem Relation Age of Onset  . Heart disease Father   . Throat cancer Father   . Throat cancer Mother   . Heart disease Mother   . Hypertension Mother     Allergies  Allergen Reactions  . Ace Inhibitors     Stopped by Dr. Burt Knack due to progressive renal failure  . Prednisone Other (See Comments)    Raised blood sugar    Current Outpatient Prescriptions on File Prior to Visit  Medication Sig Dispense Refill  . aspirin 81 MG chewable tablet Chew 81 mg by mouth daily.    . benzonatate (TESSALON) 100 MG capsule Take 1 capsule (100 mg total) by mouth 2 (two) times daily as needed for cough. 20 capsule 0  . calcitRIOL (ROCALTROL) 0.25 MCG capsule Take 0.25 mcg by mouth daily.     . digoxin (LANOXIN) 0.125 MG tablet Take 1 tablet (0.125  mg total) by mouth every other day. 45 tablet 1  . furosemide (LASIX) 80 MG tablet Take 1 tablet (80 mg total) by mouth 2 (two) times daily. 180 tablet 1  . glucose blood (FREESTYLE LITE) test strip 1 each by Other route 2 (two) times daily as needed for other. Dx: 250.00 100 each 12  . hydrALAZINE (APRESOLINE) 25 MG tablet TAKE 1 AND 1/2 TABLETS     (37.5MG  TOTAL) 3 TIMES A   DAY 180 tablet 1  . insulin lispro protamine-lispro (HUMALOG 75/25 MIX) (75-25) 100 UNIT/ML SUSP injection Inject 26 Units into the skin 2 (two) times daily with a meal. 6 vial 1  . isosorbide mononitrate (IMDUR) 30 MG 24 hr tablet Take 0.5 tablets (15 mg total) by mouth daily. 90 tablet 1  . Lancets (FREESTYLE) lancets 1 each by Other route 2 (two) times daily as needed for other. 100 each 12  . metoprolol succinate (TOPROL-XL) 100 MG 24 hr tablet Take 1 tablet (100 mg total) by mouth 2 (two) times daily. Take with or immediately following a meal. 180 tablet 1  . nitroGLYCERIN (NITROSTAT) 0.4 MG SL tablet Place 0.4 mg under the tongue every 5 (five) minutes x 3 doses as needed for chest pain.    . potassium chloride SA (K-DUR,KLOR-CON) 20 MEQ tablet Take 1 tablet (20 mEq total) by mouth 2 (two) times daily. 180 tablet 3  . warfarin (COUMADIN) 5 MG tablet On Monday and Friday take 2.5 mg; On Tuesday, Wednesday, Thursday, Saturday, and Sunday takes 5 mg. 90 tablet 1   No current facility-administered medications on file prior to visit.    BP 142/68 mmHg  Pulse 68  Temp(Src) 98 F (36.7 C) (Oral)  Resp 20  Ht 6' (1.829 m)  Wt 177 lb (80.287 kg)  BMI 24.00 kg/m2  SpO2 95%     Review of Systems  Constitutional: Positive for appetite change. Negative for fever, chills and fatigue.  HENT: Negative for congestion, dental problem, ear pain, hearing loss, sore throat, tinnitus, trouble swallowing and voice change.   Eyes: Negative for pain, discharge and visual disturbance.  Respiratory: Positive for cough. Negative for  chest tightness, wheezing and stridor.   Cardiovascular: Negative for chest pain, palpitations and leg swelling.  Gastrointestinal: Negative for nausea, vomiting, abdominal pain, diarrhea, constipation, blood in stool and abdominal  distention.  Genitourinary: Negative for urgency, hematuria, flank pain, discharge, difficulty urinating and genital sores.  Musculoskeletal: Negative for myalgias, back pain, joint swelling, arthralgias, gait problem and neck stiffness.  Skin: Negative for rash.  Neurological: Negative for dizziness, syncope, speech difficulty, weakness, numbness and headaches.  Hematological: Negative for adenopathy. Does not bruise/bleed easily.  Psychiatric/Behavioral: Negative for behavioral problems and dysphoric mood. The patient is not nervous/anxious.        Objective:   Physical Exam  Constitutional: He is oriented to person, place, and time. He appears well-developed and well-nourished. No distress.  HENT:  Head: Normocephalic.  Right Ear: External ear normal.  Left Ear: External ear normal.  Eyes: Conjunctivae and EOM are normal.  Neck: Normal range of motion.  Cardiovascular: Normal rate, regular rhythm and normal heart sounds.   Pulmonary/Chest: Breath sounds normal. No respiratory distress. He has no wheezes. He has no rales. He exhibits no tenderness.  Abdominal: Bowel sounds are normal.  Musculoskeletal: Normal range of motion. He exhibits no edema or tenderness.  Neurological: He is alert and oriented to person, place, and time.  Psychiatric: He has a normal mood and affect. His behavior is normal.          Assessment & Plan:    Viral URI.  Improved.  Will continue symptomatic treatment  Diabetes mellitus  Essential hypertension, stable  Cardiomyopathy   Return in 2 months as scheduled

## 2014-10-03 ENCOUNTER — Ambulatory Visit: Payer: Medicare Other | Admitting: Family

## 2014-10-03 ENCOUNTER — Ambulatory Visit: Payer: Medicare Other | Admitting: Internal Medicine

## 2014-10-09 ENCOUNTER — Ambulatory Visit (INDEPENDENT_AMBULATORY_CARE_PROVIDER_SITE_OTHER): Payer: Medicare Other | Admitting: Family

## 2014-10-09 DIAGNOSIS — I482 Chronic atrial fibrillation, unspecified: Secondary | ICD-10-CM

## 2014-10-09 DIAGNOSIS — Z5181 Encounter for therapeutic drug level monitoring: Secondary | ICD-10-CM

## 2014-10-09 LAB — POCT INR: INR: 1.4

## 2014-10-09 NOTE — Patient Instructions (Signed)
Take 10 mg today. Then continue 2.5mg  on Mondays and Fridays only. 5 mg all other days. Recheck in 10 days   Anticoagulation Dose Instructions as of 10/09/2014      Dorene Grebe Tue Wed Thu Fri Sat   New Dose 5 mg 2.5 mg 5 mg 5 mg 5 mg 2.5 mg 5 mg    Description        Take 10 mg today. Then continue 2.5mg  on Mondays and Fridays only. 5 mg all other days. Recheck in 10 days

## 2014-10-19 ENCOUNTER — Ambulatory Visit (INDEPENDENT_AMBULATORY_CARE_PROVIDER_SITE_OTHER): Payer: Medicare Other | Admitting: Family

## 2014-10-19 DIAGNOSIS — I482 Chronic atrial fibrillation, unspecified: Secondary | ICD-10-CM

## 2014-10-19 DIAGNOSIS — Z5181 Encounter for therapeutic drug level monitoring: Secondary | ICD-10-CM

## 2014-10-19 LAB — POCT INR: INR: 2.8

## 2014-10-19 NOTE — Patient Instructions (Signed)
Continue 2.5mg  on Mondays and Fridays only. 5 mg all other days. Recheck in 4 weeks   Anticoagulation Dose Instructions as of 10/19/2014      [name] [name] Tue Wed Thu Fri Sat   New Dose 5 mg 2.5 mg 5 mg 5 mg 5 mg 2.5 mg 5 mg    Description        Continue 2.5mg  on Mondays and Fridays only. 5 mg all other days. Recheck in 4 weeks

## 2014-11-16 ENCOUNTER — Ambulatory Visit (INDEPENDENT_AMBULATORY_CARE_PROVIDER_SITE_OTHER): Payer: Medicare Other | Admitting: General Practice

## 2014-11-16 DIAGNOSIS — I482 Chronic atrial fibrillation, unspecified: Secondary | ICD-10-CM

## 2014-11-16 DIAGNOSIS — Z5181 Encounter for therapeutic drug level monitoring: Secondary | ICD-10-CM

## 2014-11-16 LAB — POCT INR: INR: 2.7

## 2014-11-16 NOTE — Progress Notes (Signed)
Pre visit review using our clinic review tool, if applicable. No additional management support is needed unless otherwise documented below in the visit note. 

## 2014-11-21 ENCOUNTER — Ambulatory Visit: Payer: Medicare Other | Admitting: Podiatry

## 2014-11-29 ENCOUNTER — Encounter: Payer: Self-pay | Admitting: Podiatry

## 2014-11-29 ENCOUNTER — Ambulatory Visit (INDEPENDENT_AMBULATORY_CARE_PROVIDER_SITE_OTHER): Payer: Medicare Other | Admitting: Podiatry

## 2014-11-29 DIAGNOSIS — M79606 Pain in leg, unspecified: Secondary | ICD-10-CM | POA: Diagnosis not present

## 2014-11-29 DIAGNOSIS — B351 Tinea unguium: Secondary | ICD-10-CM

## 2014-11-29 NOTE — Progress Notes (Signed)
Subjective:  81 y.o. year old male IDDM presents requesting toe nails trimmed today.  3rd digit left had broken nail and bled. Patient does not recall getting any injury. Denies any other problems.   Objective:  Dermatologic:  Thick dystrophic nails x 10. Skin: No skin lesions.  Vascular:  Dorsalis pedis pulses palpable on both feet.  Posterior tibial pulses are not palpable on both feet.  No edema or erythema, no ischemic changes noted.  Orthopedic:  Cavus type foot without gross deformity.  Neurologic:  Failed respond to Monofilament sensory testing bilateral. Vibratory and Ankle DTR is within normal bilateral.   Assessment:  Dystrophic mycotic nails x 9.  Diabetic neuropathy.   Treatment: All mycotic nails and ingrown nails debrided.  Return in 3 months or as needed

## 2014-11-29 NOTE — Patient Instructions (Signed)
Seen for hypertrophic nails. All nails debrided. Return in 3 months or as needed.  

## 2014-12-04 ENCOUNTER — Other Ambulatory Visit: Payer: Self-pay | Admitting: Internal Medicine

## 2014-12-05 ENCOUNTER — Encounter (HOSPITAL_BASED_OUTPATIENT_CLINIC_OR_DEPARTMENT_OTHER): Payer: Self-pay | Admitting: *Deleted

## 2014-12-05 ENCOUNTER — Emergency Department (HOSPITAL_BASED_OUTPATIENT_CLINIC_OR_DEPARTMENT_OTHER): Payer: Medicare Other

## 2014-12-05 ENCOUNTER — Observation Stay (HOSPITAL_BASED_OUTPATIENT_CLINIC_OR_DEPARTMENT_OTHER)
Admission: EM | Admit: 2014-12-05 | Discharge: 2014-12-07 | Disposition: A | Discharge status: Home / Self Care | Payer: Medicare Other | Attending: Internal Medicine | Admitting: Internal Medicine

## 2014-12-05 DIAGNOSIS — E785 Hyperlipidemia, unspecified: Secondary | ICD-10-CM | POA: Diagnosis not present

## 2014-12-05 DIAGNOSIS — I129 Hypertensive chronic kidney disease with stage 1 through stage 4 chronic kidney disease, or unspecified chronic kidney disease: Secondary | ICD-10-CM | POA: Diagnosis not present

## 2014-12-05 DIAGNOSIS — N189 Chronic kidney disease, unspecified: Secondary | ICD-10-CM

## 2014-12-05 DIAGNOSIS — Z8546 Personal history of malignant neoplasm of prostate: Secondary | ICD-10-CM | POA: Insufficient documentation

## 2014-12-05 DIAGNOSIS — R0602 Shortness of breath: Secondary | ICD-10-CM | POA: Diagnosis present

## 2014-12-05 DIAGNOSIS — F1099 Alcohol use, unspecified with unspecified alcohol-induced disorder: Secondary | ICD-10-CM | POA: Diagnosis not present

## 2014-12-05 DIAGNOSIS — I5043 Acute on chronic combined systolic (congestive) and diastolic (congestive) heart failure: Secondary | ICD-10-CM | POA: Diagnosis not present

## 2014-12-05 DIAGNOSIS — M1990 Unspecified osteoarthritis, unspecified site: Secondary | ICD-10-CM | POA: Insufficient documentation

## 2014-12-05 DIAGNOSIS — I482 Chronic atrial fibrillation, unspecified: Secondary | ICD-10-CM | POA: Diagnosis present

## 2014-12-05 DIAGNOSIS — N184 Chronic kidney disease, stage 4 (severe): Secondary | ICD-10-CM | POA: Diagnosis not present

## 2014-12-05 DIAGNOSIS — Z87891 Personal history of nicotine dependence: Secondary | ICD-10-CM | POA: Insufficient documentation

## 2014-12-05 DIAGNOSIS — Z950 Presence of cardiac pacemaker: Secondary | ICD-10-CM | POA: Diagnosis not present

## 2014-12-05 DIAGNOSIS — Z85038 Personal history of other malignant neoplasm of large intestine: Secondary | ICD-10-CM | POA: Insufficient documentation

## 2014-12-05 DIAGNOSIS — I255 Ischemic cardiomyopathy: Secondary | ICD-10-CM | POA: Diagnosis not present

## 2014-12-05 DIAGNOSIS — R079 Chest pain, unspecified: Secondary | ICD-10-CM

## 2014-12-05 DIAGNOSIS — E0821 Diabetes mellitus due to underlying condition with diabetic nephropathy: Secondary | ICD-10-CM

## 2014-12-05 DIAGNOSIS — Z95810 Presence of automatic (implantable) cardiac defibrillator: Secondary | ICD-10-CM | POA: Insufficient documentation

## 2014-12-05 DIAGNOSIS — N2581 Secondary hyperparathyroidism of renal origin: Secondary | ICD-10-CM | POA: Insufficient documentation

## 2014-12-05 DIAGNOSIS — I10 Essential (primary) hypertension: Secondary | ICD-10-CM | POA: Diagnosis present

## 2014-12-05 DIAGNOSIS — Z85828 Personal history of other malignant neoplasm of skin: Secondary | ICD-10-CM | POA: Diagnosis not present

## 2014-12-05 DIAGNOSIS — I2510 Atherosclerotic heart disease of native coronary artery without angina pectoris: Secondary | ICD-10-CM | POA: Diagnosis not present

## 2014-12-05 DIAGNOSIS — E1121 Type 2 diabetes mellitus with diabetic nephropathy: Secondary | ICD-10-CM | POA: Diagnosis not present

## 2014-12-05 DIAGNOSIS — Z888 Allergy status to other drugs, medicaments and biological substances status: Secondary | ICD-10-CM | POA: Diagnosis not present

## 2014-12-05 DIAGNOSIS — Z794 Long term (current) use of insulin: Secondary | ICD-10-CM | POA: Insufficient documentation

## 2014-12-05 DIAGNOSIS — Z7901 Long term (current) use of anticoagulants: Secondary | ICD-10-CM | POA: Diagnosis not present

## 2014-12-05 DIAGNOSIS — I4891 Unspecified atrial fibrillation: Secondary | ICD-10-CM | POA: Diagnosis present

## 2014-12-05 DIAGNOSIS — Z7982 Long term (current) use of aspirin: Secondary | ICD-10-CM | POA: Diagnosis not present

## 2014-12-05 DIAGNOSIS — E1129 Type 2 diabetes mellitus with other diabetic kidney complication: Secondary | ICD-10-CM | POA: Diagnosis present

## 2014-12-05 DIAGNOSIS — Z96651 Presence of right artificial knee joint: Secondary | ICD-10-CM | POA: Diagnosis not present

## 2014-12-05 DIAGNOSIS — Z4502 Encounter for adjustment and management of automatic implantable cardiac defibrillator: Secondary | ICD-10-CM

## 2014-12-05 DIAGNOSIS — Z955 Presence of coronary angioplasty implant and graft: Secondary | ICD-10-CM | POA: Insufficient documentation

## 2014-12-05 DIAGNOSIS — I509 Heart failure, unspecified: Secondary | ICD-10-CM

## 2014-12-05 HISTORY — DX: Presence of cardiac pacemaker: Z95.0

## 2014-12-05 HISTORY — DX: Malignant neoplasm of colon, unspecified: C18.9

## 2014-12-05 HISTORY — DX: Unspecified malignant neoplasm of skin, unspecified: C44.90

## 2014-12-05 HISTORY — DX: Malignant neoplasm of prostate: C61

## 2014-12-05 LAB — CBC
HCT: 37 % — ABNORMAL LOW (ref 39.0–52.0)
Hemoglobin: 12.1 g/dL — ABNORMAL LOW (ref 13.0–17.0)
MCH: 30 pg (ref 26.0–34.0)
MCHC: 32.7 g/dL (ref 30.0–36.0)
MCV: 91.8 fL (ref 78.0–100.0)
PLATELETS: 142 10*3/uL — AB (ref 150–400)
RBC: 4.03 MIL/uL — ABNORMAL LOW (ref 4.22–5.81)
RDW: 14.7 % (ref 11.5–15.5)
WBC: 6.3 10*3/uL (ref 4.0–10.5)

## 2014-12-05 LAB — BASIC METABOLIC PANEL
Anion gap: 13 (ref 5–15)
BUN: 80 mg/dL — ABNORMAL HIGH (ref 6–23)
CHLORIDE: 103 mmol/L (ref 96–112)
CO2: 23 mmol/L (ref 19–32)
Calcium: 9.8 mg/dL (ref 8.4–10.5)
Creatinine, Ser: 4.51 mg/dL — ABNORMAL HIGH (ref 0.50–1.35)
GFR, EST AFRICAN AMERICAN: 13 mL/min — AB (ref >90)
GFR, EST NON AFRICAN AMERICAN: 11 mL/min — AB (ref >90)
Glucose, Bld: 241 mg/dL — ABNORMAL HIGH (ref 70–99)
Potassium: 3.9 mmol/L (ref 3.5–5.1)
Sodium: 139 mmol/L (ref 135–145)

## 2014-12-05 LAB — CBC WITH DIFFERENTIAL/PLATELET
Basophils Absolute: 0 10*3/uL (ref 0.0–0.1)
Basophils Relative: 0 % (ref 0–1)
Eosinophils Absolute: 0.2 10*3/uL (ref 0.0–0.7)
Eosinophils Relative: 2 % (ref 0–5)
HEMATOCRIT: 39.9 % (ref 39.0–52.0)
Hemoglobin: 13.4 g/dL (ref 13.0–17.0)
LYMPHS PCT: 22 % (ref 12–46)
Lymphs Abs: 1.5 10*3/uL (ref 0.7–4.0)
MCH: 31.4 pg (ref 26.0–34.0)
MCHC: 33.6 g/dL (ref 30.0–36.0)
MCV: 93.4 fL (ref 78.0–100.0)
MONO ABS: 0.6 10*3/uL (ref 0.1–1.0)
MONOS PCT: 8 % (ref 3–12)
Neutro Abs: 4.8 10*3/uL (ref 1.7–7.7)
Neutrophils Relative %: 68 % (ref 43–77)
Platelets: 151 10*3/uL (ref 150–400)
RBC: 4.27 MIL/uL (ref 4.22–5.81)
RDW: 14.7 % (ref 11.5–15.5)
WBC: 7.1 10*3/uL (ref 4.0–10.5)

## 2014-12-05 LAB — TROPONIN I
TROPONIN I: 0.07 ng/mL — AB (ref <0.031)
TROPONIN I: 0.07 ng/mL — AB (ref <0.031)

## 2014-12-05 LAB — GLUCOSE, CAPILLARY
GLUCOSE-CAPILLARY: 239 mg/dL — AB (ref 70–99)
Glucose-Capillary: 175 mg/dL — ABNORMAL HIGH (ref 70–99)

## 2014-12-05 LAB — MAGNESIUM: MAGNESIUM: 2.2 mg/dL (ref 1.5–2.5)

## 2014-12-05 LAB — PROTIME-INR
INR: 1.54 — ABNORMAL HIGH (ref 0.00–1.49)
Prothrombin Time: 18.5 seconds — ABNORMAL HIGH (ref 11.6–15.2)

## 2014-12-05 LAB — CREATININE, SERUM
Creatinine, Ser: 4.47 mg/dL — ABNORMAL HIGH (ref 0.50–1.35)
GFR calc Af Amer: 13 mL/min — ABNORMAL LOW (ref >90)
GFR calc non Af Amer: 11 mL/min — ABNORMAL LOW (ref >90)

## 2014-12-05 LAB — DIGOXIN LEVEL: Digoxin Level: 0.4 ng/mL — ABNORMAL LOW (ref 0.8–2.0)

## 2014-12-05 LAB — BRAIN NATRIURETIC PEPTIDE: B NATRIURETIC PEPTIDE 5: 1144.6 pg/mL — AB (ref 0.0–100.0)

## 2014-12-05 MED ORDER — MORPHINE SULFATE 4 MG/ML IJ SOLN: 4.0000 mg | Freq: Once | INTRAMUSCULAR | Status: DC

## 2014-12-05 MED ORDER — ASPIRIN 81 MG PO CHEW
324.0000 mg | CHEWABLE_TABLET | Freq: Once | ORAL | Status: AC
  Administered 2014-12-05: 324 mg via ORAL
  Filled 2014-12-05: qty 4

## 2014-12-05 MED ORDER — WARFARIN - PHARMACIST DOSING INPATIENT: Freq: Every day | Status: DC

## 2014-12-05 MED ORDER — FUROSEMIDE 80 MG PO TABS
80.0000 mg | ORAL_TABLET | Freq: Two times a day (BID) | ORAL | Status: DC
  Filled 2014-12-05 (×2): qty 1

## 2014-12-05 MED ORDER — SODIUM CHLORIDE 0.9 % IV SOLN: 250.0000 mL | INTRAVENOUS | Status: DC | PRN

## 2014-12-05 MED ORDER — SODIUM CHLORIDE 0.9 % IJ SOLN
3.0000 mL | Freq: Two times a day (BID) | INTRAMUSCULAR | Status: DC
  Administered 2014-12-05 – 2014-12-07 (×4): 3 mL via INTRAVENOUS

## 2014-12-05 MED ORDER — ALUM & MAG HYDROXIDE-SIMETH 200-200-20 MG/5ML PO SUSP
30.0000 mL | ORAL | Status: DC | PRN
  Administered 2014-12-05: 30 mL via ORAL
  Filled 2014-12-05: qty 30

## 2014-12-05 MED ORDER — METOPROLOL SUCCINATE ER 100 MG PO TB24
100.0000 mg | ORAL_TABLET | Freq: Two times a day (BID) | ORAL | Status: DC
  Administered 2014-12-05 – 2014-12-07 (×4): 100 mg via ORAL
  Filled 2014-12-05 (×5): qty 1

## 2014-12-05 MED ORDER — DIGOXIN 250 MCG PO TABS
0.2500 mg | ORAL_TABLET | Freq: Once | ORAL | Status: AC
  Administered 2014-12-05: 0.25 mg via ORAL
  Filled 2014-12-05: qty 1

## 2014-12-05 MED ORDER — FUROSEMIDE 80 MG PO TABS
80.0000 mg | ORAL_TABLET | Freq: Two times a day (BID) | ORAL | Status: DC
  Administered 2014-12-06 – 2014-12-07 (×3): 80 mg via ORAL
  Filled 2014-12-05 (×5): qty 1

## 2014-12-05 MED ORDER — ACETAMINOPHEN 325 MG PO TABS: 650.0000 mg | ORAL_TABLET | ORAL | Status: DC | PRN

## 2014-12-05 MED ORDER — ONDANSETRON HCL 4 MG/2ML IJ SOLN: 4.0000 mg | Freq: Four times a day (QID) | INTRAMUSCULAR | Status: DC | PRN

## 2014-12-05 MED ORDER — INSULIN ASPART 100 UNIT/ML ~~LOC~~ SOLN
0.0000 [IU] | Freq: Three times a day (TID) | SUBCUTANEOUS | Status: DC
  Administered 2014-12-06 (×2): 3 [IU] via SUBCUTANEOUS
  Administered 2014-12-06: 5 [IU] via SUBCUTANEOUS
  Administered 2014-12-07: 2 [IU] via SUBCUTANEOUS

## 2014-12-05 MED ORDER — SODIUM CHLORIDE 0.9 % IJ SOLN: 3.0000 mL | INTRAMUSCULAR | Status: DC | PRN

## 2014-12-05 MED ORDER — NITROGLYCERIN 0.4 MG SL SUBL
0.4000 mg | SUBLINGUAL_TABLET | SUBLINGUAL | Status: AC | PRN
  Administered 2014-12-05 (×3): 0.4 mg via SUBLINGUAL
  Filled 2014-12-05: qty 1

## 2014-12-05 MED ORDER — CALCITRIOL 0.25 MCG PO CAPS
0.2500 ug | ORAL_CAPSULE | Freq: Every day | ORAL | Status: DC
  Administered 2014-12-05 – 2014-12-07 (×3): 0.25 ug via ORAL
  Filled 2014-12-05 (×3): qty 1

## 2014-12-05 MED ORDER — POTASSIUM CHLORIDE CRYS ER 20 MEQ PO TBCR
40.0000 meq | EXTENDED_RELEASE_TABLET | Freq: Every day | ORAL | Status: DC
  Administered 2014-12-05 – 2014-12-07 (×3): 40 meq via ORAL
  Filled 2014-12-05 (×3): qty 2

## 2014-12-05 MED ORDER — ISOSORBIDE MONONITRATE 15 MG HALF TABLET
15.0000 mg | ORAL_TABLET | Freq: Every day | ORAL | Status: DC
  Administered 2014-12-05 – 2014-12-07 (×3): 15 mg via ORAL
  Filled 2014-12-05 (×3): qty 1

## 2014-12-05 MED ORDER — FUROSEMIDE 10 MG/ML IJ SOLN
40.0000 mg | Freq: Once | INTRAMUSCULAR | Status: AC
  Administered 2014-12-05: 40 mg via INTRAVENOUS
  Filled 2014-12-05: qty 4

## 2014-12-05 MED ORDER — DIGOXIN 125 MCG PO TABS
0.1250 mg | ORAL_TABLET | ORAL | Status: DC
  Administered 2014-12-05 – 2014-12-07 (×2): 0.125 mg via ORAL
  Filled 2014-12-05 (×2): qty 1

## 2014-12-05 MED ORDER — ENSURE COMPLETE PO LIQD
237.0000 mL | Freq: Two times a day (BID) | ORAL | Status: DC
  Administered 2014-12-06: 237 mL via ORAL

## 2014-12-05 MED ORDER — HEPARIN SODIUM (PORCINE) 5000 UNIT/ML IJ SOLN: 5000.0000 [IU] | Freq: Three times a day (TID) | INTRAMUSCULAR | Status: DC

## 2014-12-05 MED ORDER — WARFARIN SODIUM 5 MG PO TABS
5.0000 mg | ORAL_TABLET | Freq: Once | ORAL | Status: AC
  Administered 2014-12-05: 5 mg via ORAL
  Filled 2014-12-05: qty 1

## 2014-12-05 MED ORDER — ASPIRIN 81 MG PO CHEW
81.0000 mg | CHEWABLE_TABLET | Freq: Every day | ORAL | Status: DC
  Administered 2014-12-06: 81 mg via ORAL
  Filled 2014-12-05 (×2): qty 1

## 2014-12-05 MED ORDER — INSULIN GLARGINE 100 UNIT/ML ~~LOC~~ SOLN
40.0000 [IU] | Freq: Every day | SUBCUTANEOUS | Status: DC
  Administered 2014-12-05 – 2014-12-06 (×2): 40 [IU] via SUBCUTANEOUS
  Filled 2014-12-05 (×3): qty 0.4

## 2014-12-05 MED ORDER — NITROGLYCERIN 0.4 MG SL SUBL: 0.4000 mg | SUBLINGUAL_TABLET | SUBLINGUAL | Status: DC | PRN

## 2014-12-05 MED ORDER — INSULIN ASPART 100 UNIT/ML ~~LOC~~ SOLN
0.0000 [IU] | Freq: Every day | SUBCUTANEOUS | Status: DC
  Administered 2014-12-05: 2 [IU] via SUBCUTANEOUS

## 2014-12-05 MED ORDER — FUROSEMIDE 80 MG PO TABS
120.0000 mg | ORAL_TABLET | Freq: Once | ORAL | Status: AC
  Administered 2014-12-05: 120 mg via ORAL
  Filled 2014-12-05: qty 1

## 2014-12-05 MED ORDER — HYDRALAZINE HCL 25 MG PO TABS
25.0000 mg | ORAL_TABLET | Freq: Three times a day (TID) | ORAL | Status: DC
  Administered 2014-12-05 – 2014-12-06 (×2): 25 mg via ORAL
  Filled 2014-12-05 (×5): qty 1

## 2014-12-05 MED ORDER — BENZONATATE 100 MG PO CAPS
100.0000 mg | ORAL_CAPSULE | Freq: Two times a day (BID) | ORAL | Status: DC | PRN
  Filled 2014-12-05: qty 1

## 2014-12-05 NOTE — Progress Notes (Signed)
ANTICOAGULATION CONSULT NOTE - Initial Consult  Pharmacy Consult for coumadin Indication: atrial fibrillation  Allergies  Allergen Reactions  . Ace Inhibitors     Stopped by Dr. Burt Knack due to progressive renal failure  . Prednisone Other (See Comments)    Raised blood sugar to almost 900    Patient Measurements: Height: 6' (182.9 cm) Weight: 174 lb 8 oz (79.153 kg) IBW/kg (Calculated) : 77.6   Vital Signs: Temp: 97.6 F (36.4 C) (03/15 1559) Temp Source: Oral (03/15 1559) BP: 168/79 mmHg (03/15 1559) Pulse Rate: 79 (03/15 1559)  Labs:  Recent Labs  12/05/14 0740 12/05/14 1230  HGB 13.4  --   HCT 39.9  --   PLT 151  --   LABPROT 18.5*  --   INR 1.54*  --   CREATININE 4.51*  --   TROPONINI 0.07* 0.07*    Estimated Creatinine Clearance: 14.1 mL/min (by C-G formula based on Cr of 4.51).   Medical History: Past Medical History  Diagnosis Date  . Chronic atrial fibrillation     a. Historically difficult rate control. b. s/p CRT-D implantation with anticipation of AV junction ablation but patient then was able to accomplish rate control and ablation not undertaken.; AVN ablation 08/2013 by Dr Lovena Le  . CARDIOMYOPATHY, PRIMARY, DILATED     a. Mixed ICM/NICM - EF 15% by echo 05/2009; St. Jude CRT-D implanted 06/2009. b. Echo 04/2013 - EF 20%, mild LVH.  [name] COLON CANCER, HX OF     a. s/p partial colectomy.  . Chronic systolic CHF (congestive heart failure)   . CORONARY ARTERY DISEASE     a. BMS to LAD 04/2008. b. Cath 06/2009: nonobstructive disease.  [name] PROSTATE CANCER, HX OF     a. s/p radical prostatectomy.  [name] SKIN CANCER, HX OF   . SLEEP APNEA   . Hypertension   . Hyperlipidemia   . Diabetes mellitus   . Osteoarthritis   . Tubular adenoma of colon   . Secondary hyperparathyroidism   . CKD (chronic kidney disease), stage IV     a. Right brachiocephalic AV fistula 09/3084. Neph = Dunham.  . Vertigo   . Valvular heart disease     a. Echo 04/2013: mild AI, mild  MR.  . Pulmonary HTN     a. PA pressure 78mmHg by echo 05/01/13.    Medications:  Prescriptions prior to admission  Medication Sig Dispense Refill Last Dose  . aspirin 81 MG chewable tablet Chew 81 mg by mouth daily.   12/05/2014 at Unknown time  . calcitRIOL (ROCALTROL) 0.25 MCG capsule Take 0.25 mcg by mouth daily.    12/04/2014 at Unknown time  . DIGOX 125 MCG tablet Take 1 tablet by mouth  every other day 45 tablet 1 Past Week at Unknown time  . furosemide (LASIX) 80 MG tablet Take 1 tablet by mouth two  times daily 180 tablet 1 12/04/2014 at Unknown time  . hydrALAZINE (APRESOLINE) 25 MG tablet Take 1 and 1/2 tablets by  mouth 3 times daily 360 tablet 1 12/04/2014 at Unknown time  . insulin lispro protamine-lispro (HUMALOG 75/25 MIX) (75-25) 100 UNIT/ML SUSP injection Inject 26 Units into the skin 2 (two) times daily with a meal. 6 vial 1 12/04/2014 at Unknown time  . isosorbide mononitrate (IMDUR) 30 MG 24 hr tablet Take 0.5 tablets (15 mg total) by mouth daily. 90 tablet 1 12/04/2014 at Unknown time  . metoprolol succinate (TOPROL-XL) 100 MG 24 hr tablet Take 1 tablet by  mouth  twice a day with or  immediately following meal 180 tablet 1 12/04/2014 at 1800  . nitroGLYCERIN (NITROSTAT) 0.4 MG SL tablet Place 0.4 mg under the tongue every 5 (five) minutes x 3 doses as needed for chest pain.   12/05/2014 at Unknown time  . warfarin (COUMADIN) 5 MG tablet On Monday and Friday take 2.5 mg; On Tuesday, Wednesday, Thursday, Saturday, and Sunday takes 5 mg. 90 tablet 1 12/04/2014 at Unknown time  . benzonatate (TESSALON) 100 MG capsule Take 1 capsule (100 mg total) by mouth 2 (two) times daily as needed for cough. (Patient not taking: Reported on 12/05/2014) 20 capsule 0 Not Taking at Unknown time  . glucose blood (FREESTYLE LITE) test strip 1 each by Other route 2 (two) times daily as needed for other. Dx: 250.00 100 each 12 Taking  . Lancets (FREESTYLE) lancets 1 each by Other route 2 (two) times daily as  needed for other. 100 each 12 Taking  . potassium chloride SA (K-DUR,KLOR-CON) 20 MEQ tablet Take 1 tablet (20 mEq total) by mouth 2 (two) times daily. (Patient taking differently: Take 40 mEq by mouth daily. ) 180 tablet 3 Taking    Assessment: 81 yo M admitted to [facility] with CP, SOB.  Pharmacy consulted to dose coumadin for afib.  INR 1.54, CBC OK. Home coumadin dose 5 mg daily except 2.5 mg M/F.  Last dose 3/14.  Pt last seen in coumadin clinic 11/16/14 with INR 2.7 on 5 mg daily x 2.5 M/F.   INR is below goal today. Got 2.5 mg dose yesterday.   Goal of Therapy:  INR 2-3   Plan:  Coumadin 5 mg po x 1 dose today Daily INR  Eudelia Bunch, Pharm.D. [phone] 12/05/2014 5:02 PM

## 2014-12-05 NOTE — ED Notes (Signed)
MD at bedside. 

## 2014-12-05 NOTE — Progress Notes (Signed)
Patient has arrived to room 3E04. Patient placed on tele and CCMD notified of admission. Patient alert and oriented x4. Patient has no complaints at this time. Dr. Algis Liming paged to make aware of patient's arrival. Call light and telephone placed in reach. Will continue to monitor.

## 2014-12-05 NOTE — ED Provider Notes (Addendum)
TIME SEEN: 7:30 AM  CHIEF COMPLAINT: Chest pain, shortness of breath  HPI: Pt is a 81 y.o. male with history of atrial fibrillation on Coumadin, dilated ischemic and nonischemic cardiomyopathy with EF of 20% status post pacemaker/defibrillator, insulin-dependent diabetes, hypertension, hyperlipidemia, chronic kidney disease who has a right extremity fistula that has not yet been used, CAD with bare-metal stent to the LAD in 2009 last cardiac catheterization in 2010 who presents the emergency department with complaints of dull left sided chest pain that started early this morning that radiates into his back. Worse with exertion. Better with rest. Denies nausea, vomiting, diaphoresis, dizziness. Does have some intermittent vertigo. This is chronic for him. States he has had a dry cough intermittently this week but no fever. No lower extremity swelling or pain.  States he feels that when he exerts himself minimally he becomes very short of breath which is abnormal for him.  Cardiologist Dr. Burt Knack Primary care physician is Dr. Burnice Logan  ROS: See HPI Constitutional: no fever  Eyes: no drainage  ENT: no runny nose   Cardiovascular:   chest pain  Resp: SOB  GI: no vomiting GU: no dysuria Integumentary: no rash  Allergy: no hives  Musculoskeletal: no leg swelling  Neurological: no slurred speech ROS otherwise negative  PAST MEDICAL HISTORY/PAST SURGICAL HISTORY:  Past Medical History  Diagnosis Date  . Chronic atrial fibrillation     a. Historically difficult rate control. b. s/p CRT-D implantation with anticipation of AV junction ablation but patient then was able to accomplish rate control and ablation not undertaken.; AVN ablation 08/2013 by Dr Lovena Le  . CARDIOMYOPATHY, PRIMARY, DILATED     a. Mixed ICM/NICM - EF 15% by echo 05/2009; St. Jude CRT-D implanted 06/2009. b. Echo 04/2013 - EF 20%, mild LVH.  [name] COLON CANCER, HX OF     a. s/p partial colectomy.  . Chronic systolic CHF  (congestive heart failure)   . CORONARY ARTERY DISEASE     a. BMS to LAD 04/2008. b. Cath 06/2009: nonobstructive disease.  [name] PROSTATE CANCER, HX OF     a. s/p radical prostatectomy.  [name] SKIN CANCER, HX OF   . SLEEP APNEA   . Hypertension   . Hyperlipidemia   . Diabetes mellitus   . Osteoarthritis   . Tubular adenoma of colon   . Secondary hyperparathyroidism   . CKD (chronic kidney disease), stage IV     a. Right brachiocephalic AV fistula 05/4853. Neph = [place].  . Vertigo   . Valvular heart disease     a. Echo 04/2013: mild AI, mild MR.  . Pulmonary HTN     a. PA pressure 40mmHg by echo 05/01/13.    MEDICATIONS:  Prior to Admission medications   Medication Sig Start Date End Date Taking? Authorizing Provider  aspirin 81 MG chewable tablet Chew 81 mg by mouth daily.    Historical Provider, MD  benzonatate (TESSALON) 100 MG capsule Take 1 capsule (100 mg total) by mouth 2 (two) times daily as needed for cough. 09/25/14   Lucretia Kern, DO  calcitRIOL (ROCALTROL) 0.25 MCG capsule Take 0.25 mcg by mouth daily.  10/10/13   Burtis Junes, NP  [place] 125 MCG tablet Take 1 tablet by mouth  every other day 12/04/14   [name], MD  furosemide (LASIX) 80 MG tablet Take 1 tablet by mouth two  times daily 12/04/14   [name], MD  glucose blood (FREESTYLE LITE) test strip 1 each by  Other route 2 (two) times daily as needed for other. Dx: 250.00 07/13/14   [name], MD  hydrALAZINE (APRESOLINE) 25 MG tablet Take 1 and 1/2 tablets by  mouth 3 times daily 12/04/14   [name], MD  insulin lispro protamine-lispro (HUMALOG 75/25 MIX) (75-25) 100 UNIT/ML SUSP injection Inject 26 Units into the skin 2 (two) times daily with a meal. 09/12/14   [name], MD  isosorbide mononitrate (IMDUR) 30 MG 24 hr tablet Take 0.5 tablets (15 mg total) by mouth daily. 09/12/14   [name], MD  Lancets (FREESTYLE) lancets 1 each by Other route 2 (two) times daily as  needed for other. 02/22/14   [name], MD  metoprolol succinate (TOPROL-XL) 100 MG 24 hr tablet Take 1 tablet by mouth  twice a day with or  immediately following meal 12/04/14   [name], MD  nitroGLYCERIN (NITROSTAT) 0.4 MG SL tablet Place 0.4 mg under the tongue every 5 (five) minutes x 3 doses as needed for chest pain.    Historical Provider, MD  potassium chloride SA (K-DUR,KLOR-CON) 20 MEQ tablet Take 1 tablet (20 mEq total) by mouth 2 (two) times daily. 09/12/14   [name], MD  warfarin (COUMADIN) 5 MG tablet On Monday and Friday take 2.5 mg; On Tuesday, Wednesday, Thursday, Saturday, and Sunday takes 5 mg. 09/12/14   [name], MD    ALLERGIES:  Allergies  Allergen Reactions  . Ace Inhibitors     Stopped by Dr. Burt Knack due to progressive renal failure  . Prednisone Other (See Comments)    Raised blood sugar    SOCIAL HISTORY:  History  Substance Use Topics  . Smoking status: Former Smoker    Types: Cigarettes    Quit date: 09/22/1966  . Smokeless tobacco: Never Used  . Alcohol Use: 0.0 - 0.6 oz/week    0-1 Standard drinks or equivalent per week     Comment: occasional    FAMILY HISTORY: Family History  Problem Relation Age of Onset  . Heart disease Father   . Throat cancer Father   . Throat cancer Mother   . Heart disease Mother   . Hypertension Mother     EXAM: BP 169/83 mmHg  Pulse 75  Temp(Src) 97.8 F (36.6 C) (Oral)  Resp 18  Ht 6' (1.829 m)  Wt 183 lb (83.008 kg)  BMI 24.81 kg/m2  SpO2 96% CONSTITUTIONAL: Alert and oriented and responds appropriately to questions. Well-appearing; well-nourished HEAD: Normocephalic EYES: Conjunctivae clear, PERRL ENT: normal nose; no rhinorrhea; moist mucous membranes; pharynx without lesions noted NECK: Supple, no meningismus, no LAD; no JVD CARD: RRR; S1 and S2 appreciated; no murmurs, no clicks, no rubs, no gallops RESP: Normal chest excursion without splinting or tachypnea;  breath sounds clear and equal bilaterally; no wheezes, no rhonchi, no rales, chest wall nontender to palpation without crepitus or ecchymosis or deformity ABD/GI: Normal bowel sounds; non-distended; soft, non-tender, no rebound, no guarding BACK:  The back appears normal and is non-tender to palpation, there is no CVA tenderness; no tenderness over his back, no midline spine tenderness or step-off or deformity EXT: Normal ROM in all joints; non-tender to palpation; no edema; normal capillary refill; no cyanosis; no Tenderness or swelling, no peripheral edema   SKIN: Normal color for age and race; warm NEURO: Moves all extremities equally PSYCH: The patient's mood and manner are appropriate. Grooming and personal hygiene are appropriate.  MEDICAL DECISION MAKING: Patient  here with chest pain injured his wrist that are worse with exertion. He has a significant cardiac history and multiple risk factors. We'll obtain cardiac labs, chest x-ray. We'll give aspirin, nitroglycerin anticipate patient will need admission.  ED PROGRESS: Patient is chest pain-free. His labs show chronic kidney disease. Troponin is mildly elevated 0.07 but this is in the setting of chronic kidney disease. BNP is 1100. Chest x-ray shows CHF exacerbation. We'll give Lasix. Discussed with Dr. Algis Liming for admission to telemetry, observation bed. We'll continue to closely monitor and cycle his troponins will he is awaiting a bed.       EKG Interpretation  Date/Time:  Tuesday December 05 2014 07:27:12 EDT Ventricular Rate:  76 PR Interval:    QRS Duration: 182 QT Interval:  472 QTC Calculation: 531 R Axis:   -77 Text Interpretation:  Ventricular-paced rhythm with occasional Premature ventricular complexes Biventricular pacemaker detected Abnormal ECG No significant change since last tracing Confirmed by Kahne Helfand,  DO, Keylani Perlstein ([id number]) on 12/05/2014 7:30:33 AM        Shanieka Blea N Kamau Weatherall, DO 12/05/14 0901  1:20 PM  Pt is still chest  pain-free. Repeat troponin is unchanged at 0.07. Patient still hemodynamically stable. Awaiting a bed at [place], DO 12/05/14 1323

## 2014-12-05 NOTE — ED Notes (Signed)
Pt sts he began having left sided chest discomfort radiating to his back with SOB yesterday. Pt denies N/V or diaphoresis.

## 2014-12-05 NOTE — ED Notes (Signed)
Patient's son-in-law left, states please call him from patient's cell phone if status change or transporting to another facility.

## 2014-12-05 NOTE — Progress Notes (Signed)
Dr. Clementeen Graham in room assessing patient at this time.

## 2014-12-05 NOTE — Progress Notes (Signed)
Report received from [place], Therapist, sports at [facility]. Will be awaiting for patients arrival to room 3e04

## 2014-12-05 NOTE — Progress Notes (Signed)
Patient had 7 beat run of V-tach.  Patient asymptomatic, vital signs stable.  PA on call notified, magnesium level ordered.  Will continue to monitor.

## 2014-12-05 NOTE — H&P (Signed)
[facility] History and Physical  [name] [name]. WVP:[id number] DOB: December 16, 1930 DOA: 12/05/2014  Referring physician: Dr ward PCP: Nyoka Cowden, MD   Chief Complaint: Shortness of breath x 1 day  HPI:  ED 81-year-old male with history of combined ischemic and nonischemic cardiomyopathy with EF of 20-25% as per echo in 08/2013, , s/p CRT-D implantation, A. fib on Coumadin, coronary artery disease be BMS to LAD in 2009 followed by cardiac cath in 2010 showing nonobstructive disease, uncontrolled type 2 diabetes mellitus chronic kidney disease stage IV who presented to [facility] [place] [facility] with shortness of breath since this morning. Patient reports sudden onset of shortness of breath worsened with minimal exertion. Also reports cough with whitish phlegm. he denies any chest pain on repeated questioning. At baseline he is able to walk around the house and doing his grocery. He denies any orthopnea or PND. Reports taking his medications on a regular basis and being adherent with his diet. Denies any sick contacts, recent illness or recent travel. Patient denies headache, dizziness, fever, chills, nausea , vomiting, chest pain, palpitations,  abdominal pain, bowel or urinary symptoms. Denies change in weight or appetite.  Course in the ED Patient's vitals were stable. Blood work showed elevated BUN of 80 and creatinine of 4.51 worsened from baseline of 3.6, 7 months back. Foreign was mildly elevated to 0.07 X2 and INR of 1.54. BNP was elevated to 1154 (normally has been a BNP in 3-4K). INR was 1.54. EKG showed biventricular pacing. Patient given 40 mg IV Lasix, full dose aspirin, sublingual nitrate and hospitalists admission requested for chest pain rule out. On my discussion patient denies having any chest pain.  Review of Systems:  Constitutional: Denies fever, chills, diaphoresis, appetite change and fatigue.  HEENT: Denies visual or hearing symptoms, difficulty swallowing,  congestion, neck pain Respiratory:  SOB, DOE, cough, denies chest tightness,  and wheezing.   Cardiovascular: Denies chest pain, palpitations and leg swelling.  Gastrointestinal: Denies nausea, vomiting, abdominal pain, diarrhea, constipation, blood in stool and abdominal distention.  Genitourinary: Denies dysuria,  hematuria, flank pain and difficulty urinating.  Endocrine: Denies: hot or cold intolerance, polyuria, polydipsia. Musculoskeletal: Denies myalgias, back pain, joint pain or swelling Skin: Denies  rash and wound.  Neurological: Denies dizziness, syncope, weakness, light-headedness, numbness and headaches.     Past Medical History  Diagnosis Date  . Chronic atrial fibrillation     a. Historically difficult rate control. b. s/p CRT-D implantation with anticipation of AV junction ablation but patient then was able to accomplish rate control and ablation not undertaken.; AVN ablation 08/2013 by Dr Lovena Le  . CARDIOMYOPATHY, PRIMARY, DILATED     a. Mixed ICM/NICM - EF 15% by echo 05/2009; St. Jude CRT-D implanted 06/2009. b. Echo 04/2013 - EF 20%, mild LVH.  [name] COLON CANCER, HX OF     a. s/p partial colectomy.  . Chronic systolic CHF (congestive heart failure)   . CORONARY ARTERY DISEASE     a. BMS to LAD 04/2008. b. Cath 06/2009: nonobstructive disease.  [name] PROSTATE CANCER, HX OF     a. s/p radical prostatectomy.  [name] SKIN CANCER, HX OF   . SLEEP APNEA   . Hypertension   . Hyperlipidemia   . Diabetes mellitus   . Osteoarthritis   . Tubular adenoma of colon   . Secondary hyperparathyroidism   . CKD (chronic kidney disease), stage IV     a. Right brachiocephalic AV fistula 01/4626. Neph = [place].  .  Vertigo   . Valvular heart disease     a. Echo 04/2013: mild AI, mild MR.  . Pulmonary HTN     a. PA pressure 79mmHg by echo 05/01/13.   Past Surgical History  Procedure Laterality Date  . Colectomy    . Prostatectomy    . Penile prosthesis placement    . Removed prosthetic eye     . Ptca      stent placed  . Total knee arthroplasty  2008    right  . Pacemaker insertion  2010  . Av fistula placement  03/30/2012    Procedure: ARTERIOVENOUS (AV) FISTULA CREATION;  Surgeon: Angelia Mould, MD;  Location: [facility];  Service: Vascular;  Laterality: Right;  Creation of BrachioCephalic Fistula Right arm  . Eye surgery      left eye prothesis  . Fracture surgery      collar bone, right knee replacement  . Bi-ventricular implantable cardioverter defibrillator  (crt-d)  2010    STJ CRTD implanted by Dr [name]  . Ablation  08/2013    AVN ablation by Dr Lovena Le  . Av node ablation N/A 09/12/2013    Procedure: AV NODE ABLATION;  Surgeon: [name], MD;  Location: [facility] CATH LAB;  Service: Cardiovascular;  Laterality: N/A;   Social History:  reports that he quit smoking about 48 years ago. His smoking use included Cigarettes. He has never used smokeless tobacco. He reports that he drinks alcohol. He reports that he does not use illicit drugs.  Allergies  Allergen Reactions  . Ace Inhibitors     Stopped by Dr. Burt Knack due to progressive renal failure  . Prednisone Other (See Comments)    Raised blood sugar to almost 900    Family History  Problem Relation Age of Onset  . Heart disease Father   . Throat cancer Father   . Throat cancer Mother   . Heart disease Mother   . Hypertension Mother     Prior to Admission medications   Medication Sig Start Date End Date Taking? Authorizing Provider  aspirin 81 MG chewable tablet Chew 81 mg by mouth daily.    Historical Provider, MD  benzonatate (TESSALON) 100 MG capsule Take 1 capsule (100 mg total) by mouth 2 (two) times daily as needed for cough. 09/25/14   Lucretia Kern, DO  calcitRIOL (ROCALTROL) 0.25 MCG capsule Take 0.25 mcg by mouth daily.  10/10/13   Burtis Junes, NP  [place] 125 MCG tablet Take 1 tablet by mouth  every other day 12/04/14   [name], MD  furosemide (LASIX) 80 MG tablet Take 1 tablet by mouth  two  times daily 12/04/14   [name], MD  glucose blood (FREESTYLE LITE) test strip 1 each by Other route 2 (two) times daily as needed for other. Dx: 250.00 07/13/14   [name], MD  hydrALAZINE (APRESOLINE) 25 MG tablet Take 1 and 1/2 tablets by  mouth 3 times daily 12/04/14   [name], MD  insulin lispro protamine-lispro (HUMALOG 75/25 MIX) (75-25) 100 UNIT/ML SUSP injection Inject 26 Units into the skin 2 (two) times daily with a meal. 09/12/14   [name], MD  isosorbide mononitrate (IMDUR) 30 MG 24 hr tablet Take 0.5 tablets (15 mg total) by mouth daily. 09/12/14   [name], MD  Lancets (FREESTYLE) lancets 1 each by Other route 2 (two) times daily as needed for other. 02/22/14   [name],  MD  metoprolol succinate (TOPROL-XL) 100 MG 24 hr tablet Take 1 tablet by mouth  twice a day with or  immediately following meal 12/04/14   [name], MD  nitroGLYCERIN (NITROSTAT) 0.4 MG SL tablet Place 0.4 mg under the tongue every 5 (five) minutes x 3 doses as needed for chest pain.    Historical Provider, MD  potassium chloride SA (K-DUR,KLOR-CON) 20 MEQ tablet Take 1 tablet (20 mEq total) by mouth 2 (two) times daily. 09/12/14   [name], MD  warfarin (COUMADIN) 5 MG tablet On Monday and Friday take 2.5 mg; On Tuesday, Wednesday, Thursday, Saturday, and Sunday takes 5 mg. 09/12/14   [name], MD     Physical Exam:  Filed Vitals:   12/05/14 1300 12/05/14 1330 12/05/14 1400 12/05/14 1559  BP: 140/72 152/70 143/72 168/79  Pulse: 74 75 60 79  Temp:    97.6 F (36.4 C)  TempSrc:    Oral  Resp: 16 19 18 17   Height:    6' (1.829 m)  Weight:    79.153 kg (174 lb 8 oz)  SpO2: 97% 95% 95% 97%    Constitutional: Vital signs reviewed.  Elderly male lying in bed in no acute distress  HEENT: no pallor, no icterus, moist oral mucosa, no cervical lymphadenopathy, no JVD Cardiovascular: RRR, S1 normal, S2 normal, no  MRG Chest: CTAB, bibasilar fine crackles, no rhonchi or wheeze Abdominal: Soft. Non-tender, non-distended, bowel sounds are normal,  Ext: warm, trace edema bilaterally  Neurological: Alert and oriented, nonfocal  Labs on Admission:  Basic Metabolic Panel:  Recent Labs Lab 12/05/14 0740  NA 139  K 3.9  CL 103  CO2 23  GLUCOSE 241*  BUN 80*  CREATININE 4.51*  CALCIUM 9.8   Liver Function Tests: No results for input(s): AST, ALT, ALKPHOS, BILITOT, PROT, ALBUMIN in the last 168 hours. No results for input(s): LIPASE, AMYLASE in the last 168 hours. No results for input(s): AMMONIA in the last 168 hours. CBC:  Recent Labs Lab 12/05/14 0740  WBC 7.1  NEUTROABS 4.8  HGB 13.4  HCT 39.9  MCV 93.4  PLT 151   Cardiac Enzymes:  Recent Labs Lab 12/05/14 0740 12/05/14 1230  TROPONINI 0.07* 0.07*   BNP: Invalid input(s): POCBNP CBG: No results for input(s): GLUCAP in the last 168 hours.  Radiological Exams on Admission: Dg Chest 2 View  12/05/2014   CLINICAL DATA:  Chest pain  EXAM: CHEST  2 VIEW  COMPARISON:  09/25/2014  FINDINGS: Cardiac enlargement.  AICD remains unchanged in position.  COPD with pulmonary hyperinflation.  Small pleural effusions have developed since the prior study. Interstitial markings also more prominent suggesting mild fluid overload without edema. No pneumonia identified.  IMPRESSION: Findings consistent with mild fluid overload with interstitial edema and minimal pleural effusion bilaterally.   Electronically Signed   By: Franchot Gallo M.D.   On: 12/05/2014 08:06    EKG: Ventricular paced rhythm with occasional PVCs  Assessment/Plan  Principal problem Acute on chronic combined systolic and diastolic heart failure -Admit to telemetry -given 40 mg Iv lasix x1 in ED. Will order 120 mg tonight and resume home dose lasix ( 80 mg bid) from tomorrow) . Monitor daily weight and strict I/O -Check 2D echo. -continue ASA, metoprolol .[name] Add antitussives  for cough -Digoxin level is subtherapeutic. Will add additional dose today.  Active problem: Essential hypertension Continue home medications  Chronic kidney disease stage IV Possibly due to diabetic nephropathy.  Baseline creatinine around 3.8. Noted for worsening renal function. Patient sees Dr. Lorrene Reid ( seen last week and reports his renal function to have been unchanged). Follow-up renal function closely on increased dose Lasix. Monitor I/O. Renal consult if creatinine worsens further.   Diabetes mellitus type 2 uncontrolled with advanced  nephropathy Will place on lantus 40 unitd at ebdtime. Check A1C, monitor fsg. continue SSI  Diet:cardiac/ diabetic  DVT prophylaxis: on coumadin   Code Status: full code Family Communication:  None at bedside Disposition Plan: Admit under observation on telemetry  [place], [place] [facility] Pager [phone]  Total time spent on admission :50 minutes  If 7PM-7AM, please contact night-coverage [email or web address] Password [facility] 12/05/2014, 4:40 PM

## 2014-12-05 NOTE — Plan of Care (Signed)
Problem: Phase I Progression Outcomes Goal: Up in chair, BRP Outcome: Completed/Met Date Met:  12/05/14 Patient up in room ad lib.

## 2014-12-06 DIAGNOSIS — I5043 Acute on chronic combined systolic (congestive) and diastolic (congestive) heart failure: Secondary | ICD-10-CM | POA: Diagnosis not present

## 2014-12-06 DIAGNOSIS — N189 Chronic kidney disease, unspecified: Secondary | ICD-10-CM | POA: Diagnosis not present

## 2014-12-06 DIAGNOSIS — I509 Heart failure, unspecified: Secondary | ICD-10-CM | POA: Diagnosis not present

## 2014-12-06 LAB — BASIC METABOLIC PANEL
Anion gap: 8 (ref 5–15)
BUN: 72 mg/dL — ABNORMAL HIGH (ref 6–23)
CO2: 28 mmol/L (ref 19–32)
Calcium: 9.8 mg/dL (ref 8.4–10.5)
Chloride: 106 mmol/L (ref 96–112)
Creatinine, Ser: 4.27 mg/dL — ABNORMAL HIGH (ref 0.50–1.35)
GFR calc Af Amer: 14 mL/min — ABNORMAL LOW (ref >90)
GFR calc non Af Amer: 12 mL/min — ABNORMAL LOW (ref >90)
GLUCOSE: 170 mg/dL — AB (ref 70–99)
POTASSIUM: 3.3 mmol/L — AB (ref 3.5–5.1)
SODIUM: 142 mmol/L (ref 135–145)

## 2014-12-06 LAB — GLUCOSE, CAPILLARY
GLUCOSE-CAPILLARY: 172 mg/dL — AB (ref 70–99)
GLUCOSE-CAPILLARY: 172 mg/dL — AB (ref 70–99)
Glucose-Capillary: 245 mg/dL — ABNORMAL HIGH (ref 70–99)
Glucose-Capillary: 166 mg/dL — ABNORMAL HIGH (ref 70–99)
Glucose-Capillary: 201 mg/dL — ABNORMAL HIGH (ref 70–99)

## 2014-12-06 LAB — TROPONIN I
TROPONIN I: 0.08 ng/mL — AB (ref <0.031)
TROPONIN I: 0.09 ng/mL — AB (ref <0.031)
TROPONIN I: 0.09 ng/mL — AB (ref <0.031)

## 2014-12-06 LAB — PROTIME-INR
INR: 1.49 (ref 0.00–1.49)
Prothrombin Time: 18.1 seconds — ABNORMAL HIGH (ref 11.6–15.2)

## 2014-12-06 MED ORDER — WARFARIN SODIUM 7.5 MG PO TABS
7.5000 mg | ORAL_TABLET | Freq: Once | ORAL | Status: AC
  Administered 2014-12-06: 7.5 mg via ORAL
  Filled 2014-12-06: qty 1

## 2014-12-06 MED ORDER — HYDRALAZINE HCL 25 MG PO TABS
37.5000 mg | ORAL_TABLET | Freq: Three times a day (TID) | ORAL | Status: DC
  Administered 2014-12-06 – 2014-12-07 (×3): 37.5 mg via ORAL
  Filled 2014-12-06 (×6): qty 1.5

## 2014-12-06 NOTE — Progress Notes (Signed)
ANTICOAGULATION CONSULT NOTE - Follow Up Consult  Pharmacy Consult for Coumadin Indication: atrial fibrillation  Allergies  Allergen Reactions  . Ace Inhibitors     Stopped by Dr. Burt Knack due to progressive renal failure  . Prednisone Other (See Comments)    Raised blood sugar to almost 900    Patient Measurements: Height: 6' (182.9 cm) Weight: 170 lb 6.4 oz (77.293 kg) (Scale C) IBW/kg (Calculated) : 77.6  Vital Signs: Temp: 97.5 F (36.4 C) (03/16 0930) Temp Source: Oral (03/16 0930) BP: 149/70 mmHg (03/16 0930) Pulse Rate: 76 (03/16 0930)  Labs:  Recent Labs  12/05/14 0740 12/05/14 1230 12/05/14 1919 12/06/14 0428 12/06/14 0934  HGB 13.4  --  12.1*  --   --   HCT 39.9  --  37.0*  --   --   PLT 151  --  142*  --   --   LABPROT 18.5*  --   --  18.1*  --   INR 1.54*  --   --  1.49  --   CREATININE 4.51*  --  4.47* 4.27*  --   TROPONINI 0.07* 0.07*  --   --  0.08*    Estimated Creatinine Clearance: 14.8 mL/min (by C-G formula based on Cr of 4.27).  Assessment:   Coumadin for atrial fibrillation.   INR remains subtherapeutic at 1.49.  Usual dose of Coumadin 5 mg given last night when INR 1.54.  Home regimen is 5 mg daily expect 2.5 mg on Mondays and Fridays. Patient reports not missing any doses prior to admission.  Last outpatient INR was 2.7 on 11/16/14.  Goal of Therapy:  INR 2-3 Monitor platelets by anticoagulation protocol: Yes   Plan:   Coumadin 7.5 mg x 1 today instead of usual 5 mg Wednesday dose.  Daily PT/INR.  Arty Baumgartner, [place] Pager: [phone] 12/06/2014,12:29 PM

## 2014-12-06 NOTE — Progress Notes (Signed)
UR completed 

## 2014-12-06 NOTE — Progress Notes (Signed)
  Echocardiogram 2D Echocardiogram has been performed.  Lysle Rubens 12/06/2014, 11:35 AM

## 2014-12-06 NOTE — Care Management Note (Signed)
    Page 1 of 1   12/07/2014     4:52:21 PM CARE MANAGEMENT NOTE 12/07/2014  Patient:  [name] [name], [name] [name]   Account Number:  [id number]  Date Initiated:  12/06/2014  Documentation initiated by:  Antanette Richwine  Subjective/Objective Assessment:   Pt adm on 12/05/14 with ARF, CHF, cough.  PTA, pt resides at home alone.     Action/Plan:   Will follow for dc needs as pt progresses.   Anticipated DC Date:  12/07/2014   Anticipated DC Plan:  [place]  CM consult      Choice offered to / List presented to:             Status of service:  Completed, signed off Medicare Important Message given?  NA - LOS <3 / Initial given by admissions (If response is "NO", the following Medicare IM given date fields will be blank) Date Medicare IM given:   Medicare IM given by:   Date Additional Medicare IM given:   Additional Medicare IM given by:    Discharge Disposition:  HOME/SELF CARE  Per UR Regulation:  Reviewed for med. necessity/level of care/duration of stay  If discussed at [place] of Stay Meetings, dates discussed:    Comments:

## 2014-12-06 NOTE — Progress Notes (Signed)
Nutrition Brief Note  Patient identified on the Malnutrition Screening Tool (MST) Report  Wt Readings from Last 15 Encounters:  12/06/14 170 lb 6.4 oz (77.293 kg)  10/02/14 177 lb (80.287 kg)  09/25/14 175 lb 9.6 oz (79.652 kg)  09/20/14 180 lb 3.2 oz (81.738 kg)  09/12/14 181 lb (82.101 kg)  09/12/14 181 lb (82.101 kg)  07/13/14 181 lb (82.101 kg)  06/21/14 179 lb (81.194 kg)  05/07/14 205 lb (92.987 kg)  03/21/14 184 lb 12.8 oz (83.825 kg)  02/28/14 180 lb (81.647 kg)  02/22/14 183 lb (83.008 kg)  02/18/14 182 lb 12.8 oz (82.918 kg)  02/15/14 187 lb (84.823 kg)  12/20/13 187 lb (84.823 kg)    Body mass index is 23.11 kg/(m^2). Patient meets criteria for Normal Weight based on current BMI. Patient reports that he lost weight this past year due to poor appetite after his wife passed away. He reports that his appetite has improved recently and he has been eating much better. RD reviewed patient's dietary recall. Encouraged pt to weigh himself daily at home.   Current diet order is Heart Healthy/Carb Modified, patient is consuming approximately 100% of meals at this time. Labs and medications reviewed.   No nutrition interventions warranted at this time. If nutrition issues arise, please consult RD.   Pryor Ochoa RD, LDN Inpatient Clinical Dietitian Pager: [phone] After Hours Pager: [phone]

## 2014-12-06 NOTE — Progress Notes (Signed)
Inpatient Diabetes Program Recommendations  AACE/ADA: New Consensus Statement on Inpatient Glycemic Control (2013)  Target Ranges:  Prepandial:   less than 140 mg/dL      Peak postprandial:   less than 180 mg/dL (1-2 hours)      Critically ill patients:  140 - 180 mg/dL   Consider adding meal coverage Novolog 3 units TID per Glycemic Control Order-set.  Thank you  Raoul Pitch BSN, RN,CDE Inpatient Diabetes Coordinator [phone] (team pager)

## 2014-12-06 NOTE — Progress Notes (Signed)
[facility] PROGRESS NOTE  Rozanna Box. [id number] DOB: Mar 31, 1931 DOA: 12/05/2014 PCP: Nyoka Cowden, MD  Assessment/Plan: Acute on chronic combined systolic and diastolic heart failure -Received  40 mg Iv lasix x1 in ED.   120 mg night of 3-15 resume home dose lasix ( 80 mg bid) from tomorrow) . Monitor daily weight and strict I/O - 2D echo. -continue ASA, metoprolol . -Continue with Digoxin -resume home dose of lasix 80 mg BID.  -weight -79----77 kg-- Negative 2 L.   Mild elevation of troponin: in setting of renal failure and HF. Denies chest pain. Echo pending.   Essential hypertension Continue home medications  Chronic kidney disease stage IV Possibly due to diabetic nephropathy. Baseline creatinine around 3.8. Noted for worsening renal function.  Follow-up renal function closely on increased dose Lasix. Monitor I/O. Renal consult if creatinine worsens further. Repeat B-met in am. 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  Check A1C, monitor fsg. continue SSI  Diet:cardiac/ diabetic  DVT prophylaxis: on coumadin  Code Status: Full code.  Family Communication: care discussed with patient  Disposition Plan: home in 24 hours.    Consultants:  none  Procedures:  ECHO pending.   Antibiotics:  none  HPI/Subjective: He is feeling better, no chest pain, breathing better.   Objective: Filed Vitals:   12/06/14 1300  BP: 134/58  Pulse: 74  Temp: 98 F (36.7 C)  Resp: 20    Intake/Output Summary (Last 24 hours) at 12/06/14 1528 Last data filed at 12/06/14 1400  Gross per 24 hour  Intake   1500 ml  Output   2275 ml  Net   -775 ml   Filed Weights   12/05/14 0728 12/05/14 1559 12/06/14 0529  Weight: 83.008 kg (183 lb) 79.153 kg (174 lb 8 oz) 77.293 kg (170 lb 6.4 oz)    Exam:   General:  Alert in no distress.    Cardiovascular: S 1, S 2 RRR  Respiratory: Crackles bases.   Abdomen: BS present soft, nt  Musculoskeletal: trace edema.   Data Reviewed: Basic Metabolic Panel:  Recent Labs Lab 12/05/14 0740 12/05/14 1919 12/06/14 0428  NA 139  --  142  K 3.9  --  3.3*  CL 103  --  106  CO2 23  --  28  GLUCOSE 241*  --  170*  BUN 80*  --  72*  CREATININE 4.51* 4.47* 4.27*  CALCIUM 9.8  --  9.8  MG  --  2.2  --    Liver Function Tests: No results for input(s): AST, ALT, ALKPHOS, BILITOT, PROT, ALBUMIN in the last 168 hours. No results for input(s): LIPASE, AMYLASE in the last 168 hours. No results for input(s): AMMONIA in the last 168 hours. CBC:  Recent Labs Lab 12/05/14 0740 12/05/14 1919  WBC 7.1 6.3  NEUTROABS 4.8  --   HGB 13.4 12.1*  HCT 39.9 37.0*  MCV 93.4 91.8  PLT 151 142*   Cardiac Enzymes:  Recent Labs Lab 12/05/14 0740 12/05/14 1230 12/06/14 0934  TROPONINI 0.07* 0.07* 0.08*   BNP (last 3 results)  Recent Labs  12/05/14 0735  BNP 1144.6*    ProBNP (last 3 results)  Recent Labs  02/16/14 2122 05/07/14 1710  PROBNP 8984.0* 12146.0*    CBG:  Recent Labs Lab 12/05/14 1651 12/05/14 2142 12/06/14 0534 12/06/14 1137  GLUCAP 175* 239* 172* 245*  No results found for this or any previous visit (from the past 240 hour(s)).   Studies: Dg Chest 2 View  12/05/2014   CLINICAL DATA:  Chest pain  EXAM: CHEST  2 VIEW  COMPARISON:  09/25/2014  FINDINGS: Cardiac enlargement.  AICD remains unchanged in position.  COPD with pulmonary hyperinflation.  Small pleural effusions have developed since the prior study. Interstitial markings also more prominent suggesting mild fluid overload without edema. No pneumonia identified.  IMPRESSION: Findings consistent with mild fluid overload with interstitial edema and minimal pleural effusion bilaterally.   Electronically Signed   By: Franchot Gallo M.D.   On: 12/05/2014 08:06    Scheduled Meds: . aspirin  81  mg Oral Daily  . calcitRIOL  0.25 mcg Oral Daily  . digoxin  0.125 mg Oral QODAY  . furosemide  80 mg Oral BID  . hydrALAZINE  37.5 mg Oral 3 times per day  . insulin aspart  0-15 Units Subcutaneous TID WC  . insulin aspart  0-5 Units Subcutaneous QHS  . insulin glargine  40 Units Subcutaneous QHS  . isosorbide mononitrate  15 mg Oral Daily  . metoprolol succinate  100 mg Oral BID  . potassium chloride SA  40 mEq Oral Daily  . sodium chloride  3 mL Intravenous Q12H  . warfarin  7.5 mg Oral ONCE-1800  . Warfarin - Pharmacist Dosing Inpatient   Does not apply q1800   Continuous Infusions:   Principal Problem:   Systolic and diastolic CHF, acute on chronic Active Problems:   Essential hypertension   Chronic atrial fibrillation   BIVentricular Defibrillator--St Jude   Chronic renal insufficiency, stage IV (severe)   Diabetes mellitus with renal complications   Cardiomyopathy, ischemic    Time spent: 35 minutes.     Niel Hummer A  [facility] Pager [phone]. If 7PM-7AM, please contact night-coverage at [email or web address], password [facility] 12/06/2014, 3:28 PM

## 2014-12-07 DIAGNOSIS — I5043 Acute on chronic combined systolic (congestive) and diastolic (congestive) heart failure: Secondary | ICD-10-CM | POA: Diagnosis not present

## 2014-12-07 LAB — GLUCOSE, CAPILLARY: GLUCOSE-CAPILLARY: 131 mg/dL — AB (ref 70–99)

## 2014-12-07 LAB — BASIC METABOLIC PANEL
Anion gap: 14 (ref 5–15)
BUN: 70 mg/dL — AB (ref 6–23)
CO2: 24 mmol/L (ref 19–32)
Calcium: 9.8 mg/dL (ref 8.4–10.5)
Chloride: 104 mmol/L (ref 96–112)
Creatinine, Ser: 4.19 mg/dL — ABNORMAL HIGH (ref 0.50–1.35)
GFR calc Af Amer: 14 mL/min — ABNORMAL LOW (ref >90)
GFR calc non Af Amer: 12 mL/min — ABNORMAL LOW (ref >90)
Glucose, Bld: 142 mg/dL — ABNORMAL HIGH (ref 70–99)
POTASSIUM: 3.2 mmol/L — AB (ref 3.5–5.1)
Sodium: 142 mmol/L (ref 135–145)

## 2014-12-07 LAB — PROTIME-INR
INR: 1.44 (ref 0.00–1.49)
Prothrombin Time: 17.7 seconds — ABNORMAL HIGH (ref 11.6–15.2)

## 2014-12-07 MED ORDER — WARFARIN SODIUM 7.5 MG PO TABS: 7.5000 mg | ORAL_TABLET | Freq: Once | ORAL | Status: DC

## 2014-12-07 MED ORDER — WARFARIN SODIUM 5 MG PO TABS: ORAL_TABLET | ORAL | Status: DC

## 2014-12-07 MED ORDER — POTASSIUM CHLORIDE CRYS ER 20 MEQ PO TBCR: 40.0000 meq | EXTENDED_RELEASE_TABLET | Freq: Every day | ORAL | Status: DC

## 2014-12-07 MED ORDER — INSULIN LISPRO PROT & LISPRO (75-25 MIX) 100 UNIT/ML ~~LOC~~ SUSP: 20.0000 [IU] | Freq: Two times a day (BID) | SUBCUTANEOUS | Status: DC

## 2014-12-07 MED ORDER — WARFARIN SODIUM 7.5 MG PO TABS
7.5000 mg | ORAL_TABLET | Freq: Once | ORAL | Status: DC
  Filled 2014-12-07: qty 1

## 2014-12-07 NOTE — Progress Notes (Signed)
ANTICOAGULATION CONSULT NOTE - Follow Up Consult  Pharmacy Consult for Coumadin Indication: atrial fibrillation  Allergies  Allergen Reactions  . Ace Inhibitors     Stopped by Dr. Burt Knack due to progressive renal failure  . Prednisone Other (See Comments)    Raised blood sugar to almost 900    Patient Measurements: Height: 6' (182.9 cm) Weight: 168 lb (76.204 kg) (scale c) IBW/kg (Calculated) : 77.6  Vital Signs: Temp: 98.4 F (36.9 C) (03/17 0601) Temp Source: Oral (03/17 0601) BP: 138/63 mmHg (03/17 0601) Pulse Rate: 68 (03/17 0601)  Labs:  Recent Labs  12/05/14 0740  12/05/14 1919 12/06/14 0428 12/06/14 0934 12/06/14 1616 12/06/14 2056 12/07/14 0510  HGB 13.4  --  12.1*  --   --   --   --   --   HCT 39.9  --  37.0*  --   --   --   --   --   PLT 151  --  142*  --   --   --   --   --   LABPROT 18.5*  --   --  18.1*  --   --   --  17.7*  INR 1.54*  --   --  1.49  --   --   --  1.44  CREATININE 4.51*  --  4.47* 4.27*  --   --   --  4.19*  TROPONINI 0.07*  < >  --   --  0.08* 0.09* 0.09*  --   < > = values in this interval not displayed.  Estimated Creatinine Clearance: 14.9 mL/min (by C-G formula based on Cr of 4.19).  Assessment: 81 year old male on Coumadin for atrial fibrillation.  INR remains sub-therapeutic and declining at 1.44 despite increased dose on 3/16. Note INR was sub-therapeutic on admission however has been therapeutic per outpatient INR of 2.7 on 11/16/14. Home regimen is 5 mg daily expect 2.5 mg on Mondays and Fridays. Patient reports not missing any doses prior to admission.    Goal of Therapy:  INR 2-3 Monitor platelets by anticoagulation protocol: Yes   Plan:  Coumadin 7.5 mg x 1 again today (higher than home dose) If to discharge, recommend increased dose today as above then resume home dose with an INR check within the week. Daily PT/INR while inpatient.  Sloan Leiter, PharmD, BCPS Clinical Pharmacist [phone] 12/07/2014,10:12  AM

## 2014-12-07 NOTE — Progress Notes (Signed)
D/C'd telemetry; D/C'd IV; D/C'd pt to his residence.  Daughter at bedside providing transportation.   Discharge instructions were explained to pt, and he had no further questions at this time.  Explained in detail with reference to his coumadin instructions, and his need to go to lab for INR testing.  He agreed that he understood.  Pt left in wheelchair, and in no acute distress.

## 2014-12-07 NOTE — Discharge Summary (Signed)
Physician Discharge Summary  [name] [name]. [id number] DOB: Jul 08, 1976 DOA: 12/05/2014  PCP: Rogelia Boga, MD  Admit date: 12/05/2014 Discharge date: 12/07/2014  Time spent: 35 minutes  Recommendations for Outpatient Follow-up:  1. Labs pending : Hb A1c.  2. Need repeat B-met to follow renal function 3. Needs to follow up with primary cardiologist   Discharge Diagnoses:    Systolic and diastolic CHF, acute on chronic   Essential hypertension   Chronic atrial fibrillation   BIVentricular Defibrillator--St Jude   Chronic renal insufficiency, stage IV (severe)   Diabetes mellitus with renal complications   Cardiomyopathy, ischemic   Discharge Condition: Stable.   Diet recommendation: renal diet   Filed Weights   12/05/14 1559 12/06/14 0529 12/07/14 0601  Weight: 79.153 kg (174 lb 8 oz) 77.293 kg (170 lb 6.4 oz) 77.16 kg (170 lb 1.7 oz)    History of present illness:  ED 81-year-old male with history of combined ischemic and nonischemic cardiomyopathy with EF of 20-25% as per echo in 08/2013, , s/p CRT-D implantation, A. fib on Coumadin, coronary artery disease be BMS to LAD in 2009 followed by cardiac cath in 2010 showing nonobstructive disease, uncontrolled type 2 diabetes mellitus chronic kidney disease stage IV who presented to [facility] [place] with shortness of breath since this morning. Patient reports sudden onset of shortness of breath worsened with minimal exertion. Also reports cough with whitish phlegm. he denies any chest pain on repeated questioning. At baseline he is able to walk around the house and doing his grocery. He denies any orthopnea or PND. Reports taking his medications on a regular basis and being adherent with his diet. Denies any sick contacts, recent illness or recent travel. Patient denies headache, dizziness, fever, chills, nausea , vomiting, chest pain, palpitations, abdominal pain, bowel or urinary symptoms. Denies change in weight or  appetite.  Hospital Course:  Acute on chronic combined systolic and diastolic heart failure -Received 40 mg Iv lasix x1 in ED. 120 mg night of 3-15 resume home dose lasix ( 80 mg bid) from tomorrow) . Monitor daily weight and strict I/O - 2D echo. -continue ASA, metoprolol . -Continue with Digoxin -resume home dose of lasix 80 mg BID.  -weight -79----77 kg-- Negative 2 L.  Stable for discharge  Mild elevation of troponin: in setting of renal failure and HF. Denies chest pain. Echo with mildly improved EF.   Essential hypertension Continue home medications  Chronic kidney disease stage IV Possibly due to diabetic nephropathy. Baseline creatinine around 3.8. Noted for worsening renal function.  Follow-up renal function closely on increased dose Lasix. Monitor I/O. Renal consult if creatinine worsens further. Renal function stable on oral lasix. Plan to discharge today with close follow up with PCP.   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  Check A1C pending. , monitor fsg. continue SSI  Procedures:  ECHO; Ef 30 %   Consultations:  none  Discharge Exam: Filed Vitals:   12/07/14 0601  BP: 138/63  Pulse: 68  Temp: 98.4 F (36.9 C)  Resp: 16    General: Alert in no distress.  Cardiovascular: S 1, S 2 RRR Respiratory: CTA  Discharge Instructions   Discharge Instructions    Diet - low sodium heart healthy    Complete by:  As directed      Diet Carb Modified    Complete by:  As directed      Increase activity slowly  Complete by:  As directed           Current Discharge Medication List    CONTINUE these medications which have CHANGED   Details  insulin lispro protamine-lispro (HUMALOG 75/25 MIX) (75-25) 100 UNIT/ML SUSP injection Inject 20 Units into the skin 2 (two) times daily with a meal. Qty: 6 vial, Refills: 1    potassium chloride SA (K-DUR,KLOR-CON) 20 MEQ tablet Take 2 tablets  (40 mEq total) by mouth daily. Qty: 180 tablet, Refills: 3      CONTINUE these medications which have NOT CHANGED   Details  aspirin 81 MG chewable tablet Chew 81 mg by mouth daily.    calcitRIOL (ROCALTROL) 0.25 MCG capsule Take 0.25 mcg by mouth daily.     DIGOX 125 MCG tablet Take 1 tablet by mouth  every other day Qty: 45 tablet, Refills: 1    furosemide (LASIX) 80 MG tablet Take 1 tablet by mouth two  times daily Qty: 180 tablet, Refills: 1    hydrALAZINE (APRESOLINE) 25 MG tablet Take 1 and 1/2 tablets by  mouth 3 times daily Qty: 360 tablet, Refills: 1    isosorbide mononitrate (IMDUR) 30 MG 24 hr tablet Take 0.5 tablets (15 mg total) by mouth daily. Qty: 90 tablet, Refills: 1    metoprolol succinate (TOPROL-XL) 100 MG 24 hr tablet Take 1 tablet by mouth  twice a day with or  immediately following meal Qty: 180 tablet, Refills: 1    nitroGLYCERIN (NITROSTAT) 0.4 MG SL tablet Place 0.4 mg under the tongue every 5 (five) minutes x 3 doses as needed for chest pain.    warfarin (COUMADIN) 5 MG tablet On Monday and Friday take 2.5 mg; On Tuesday, Wednesday, Thursday, Saturday, and Sunday takes 5 mg. Qty: 90 tablet, Refills: 1    glucose blood (FREESTYLE LITE) test strip 1 each by Other route 2 (two) times daily as needed for other. Dx: 250.00 Qty: 100 each, Refills: 12    Lancets (FREESTYLE) lancets 1 each by Other route 2 (two) times daily as needed for other. Qty: 100 each, Refills: 12      STOP taking these medications     benzonatate (TESSALON) 100 MG capsule        Allergies  Allergen Reactions  . Ace Inhibitors     Stopped by Dr. Burt Knack due to progressive renal failure  . Prednisone Other (See Comments)    Raised blood sugar to almost 900   Follow-up Information    Please follow up.   Why:  You need B-met on monday to check kidney function.       Follow up with Nyoka Cowden, MD.   Specialty:  Internal Medicine   Why:  keep your appointment.     Contact information:   [place] [address] [phone]        The results of significant diagnostics from this hospitalization (including imaging, microbiology, ancillary and laboratory) are listed below for reference.    Significant Diagnostic Studies: Dg Chest 2 View  12/05/2014   CLINICAL DATA:  Chest pain  EXAM: CHEST  2 VIEW  COMPARISON:  09/25/2014  FINDINGS: Cardiac enlargement.  AICD remains unchanged in position.  COPD with pulmonary hyperinflation.  Small pleural effusions have developed since the prior study. Interstitial markings also more prominent suggesting mild fluid overload without edema. No pneumonia identified.  IMPRESSION: Findings consistent with mild fluid overload with interstitial edema and minimal pleural effusion bilaterally.   Electronically Signed  By: Franchot Gallo M.D.   On: 12/05/2014 08:06    Microbiology: No results found for this or any previous visit (from the past 240 hour(s)).   Labs: Basic Metabolic Panel:  Recent Labs Lab 12/05/14 0740 12/05/14 1919 12/06/14 0428 12/07/14 0510  NA 139  --  142 142  K 3.9  --  3.3* 3.2*  CL 103  --  106 104  CO2 23  --  28 24  GLUCOSE 241*  --  170* 142*  BUN 80*  --  72* 70*  CREATININE 4.51* 4.47* 4.27* 4.19*  CALCIUM 9.8  --  9.8 9.8  MG  --  2.2  --   --    Liver Function Tests: No results for input(s): AST, ALT, ALKPHOS, BILITOT, PROT, ALBUMIN in the last 168 hours. No results for input(s): LIPASE, AMYLASE in the last 168 hours. No results for input(s): AMMONIA in the last 168 hours. CBC:  Recent Labs Lab 12/05/14 0740 12/05/14 1919  WBC 7.1 6.3  NEUTROABS 4.8  --   HGB 13.4 12.1*  HCT 39.9 37.0*  MCV 93.4 91.8  PLT 151 142*   Cardiac Enzymes:  Recent Labs Lab 12/05/14 0740 12/05/14 1230 12/06/14 0934 12/06/14 1616 12/06/14 2056  TROPONINI 0.07* 0.07* 0.08* 0.09* 0.09*   BNP: BNP (last 3 results)  Recent Labs  12/05/14 0735  BNP 1144.6*     ProBNP (last 3 results)  Recent Labs  02/16/14 2122 05/07/14 1710  PROBNP 8984.0* 12146.0*    CBG:  Recent Labs Lab 12/06/14 1137 12/06/14 1645 12/06/14 2104 12/06/14 2252 12/07/14 0701  GLUCAP 245* 166* 201* 172* 131*       Signed:  Margi Edmundson A  [facility] 12/07/2014, 8:30 AM

## 2014-12-08 ENCOUNTER — Telehealth: Payer: Self-pay | Admitting: Internal Medicine

## 2014-12-08 DIAGNOSIS — E1129 Type 2 diabetes mellitus with other diabetic kidney complication: Secondary | ICD-10-CM

## 2014-12-08 LAB — HEMOGLOBIN A1C
Hgb A1c MFr Bld: 8 % — ABNORMAL HIGH (ref 4.8–5.6)
MEAN PLASMA GLUCOSE: 183 mg/dL

## 2014-12-08 NOTE — Telephone Encounter (Signed)
ok 

## 2014-12-08 NOTE — Telephone Encounter (Signed)
Please see message and advise 

## 2014-12-08 NOTE — Telephone Encounter (Signed)
Patient called requesting a lab appointment on Monday to have his BMET drawn when he comes for his coumadin appointment.  He states he was told by the hospital to have it checked by his PCP on Monday.  Can you please enter the order, patient is already scheduled for lab.  He also stated he would like any other labs drawn that Dr. Raliegh Ip would suggest at that time.

## 2014-12-11 ENCOUNTER — Ambulatory Visit (INDEPENDENT_AMBULATORY_CARE_PROVIDER_SITE_OTHER): Payer: Medicare Other | Admitting: General Practice

## 2014-12-11 ENCOUNTER — Other Ambulatory Visit (INDEPENDENT_AMBULATORY_CARE_PROVIDER_SITE_OTHER): Payer: Medicare Other

## 2014-12-11 DIAGNOSIS — I482 Chronic atrial fibrillation, unspecified: Secondary | ICD-10-CM

## 2014-12-11 DIAGNOSIS — E1129 Type 2 diabetes mellitus with other diabetic kidney complication: Secondary | ICD-10-CM

## 2014-12-11 DIAGNOSIS — Z5181 Encounter for therapeutic drug level monitoring: Secondary | ICD-10-CM

## 2014-12-11 LAB — POCT INR: INR: 1.7

## 2014-12-11 LAB — BASIC METABOLIC PANEL
BUN: 68 mg/dL — ABNORMAL HIGH (ref 6–23)
CO2: 28 meq/L (ref 19–32)
CREATININE: 4.08 mg/dL — AB (ref 0.40–1.50)
Calcium: 9.8 mg/dL (ref 8.4–10.5)
Chloride: 101 mEq/L (ref 96–112)
GFR: 15.01 mL/min — ABNORMAL LOW (ref >60.00)
Glucose, Bld: 241 mg/dL — ABNORMAL HIGH (ref 70–99)
Potassium: 4.2 mEq/L (ref 3.5–5.1)
SODIUM: 141 meq/L (ref 135–145)

## 2014-12-11 NOTE — Telephone Encounter (Signed)
Pt notified BMET added to lab orders for today.

## 2014-12-11 NOTE — Progress Notes (Signed)
Pre visit review using our clinic review tool, if applicable. No additional management support is needed unless otherwise documented below in the visit note. 

## 2014-12-12 ENCOUNTER — Ambulatory Visit (INDEPENDENT_AMBULATORY_CARE_PROVIDER_SITE_OTHER): Payer: Medicare Other | Admitting: Internal Medicine

## 2014-12-12 ENCOUNTER — Encounter: Payer: Self-pay | Admitting: Internal Medicine

## 2014-12-12 ENCOUNTER — Ambulatory Visit (INDEPENDENT_AMBULATORY_CARE_PROVIDER_SITE_OTHER): Payer: Medicare Other | Admitting: *Deleted

## 2014-12-12 VITALS — BP 160/80 | HR 75 | Temp 98.1°F | Resp 20 | Ht 72.0 in | Wt 177.0 lb

## 2014-12-12 DIAGNOSIS — N189 Chronic kidney disease, unspecified: Secondary | ICD-10-CM

## 2014-12-12 DIAGNOSIS — I10 Essential (primary) hypertension: Secondary | ICD-10-CM | POA: Diagnosis not present

## 2014-12-12 DIAGNOSIS — I5043 Acute on chronic combined systolic (congestive) and diastolic (congestive) heart failure: Secondary | ICD-10-CM

## 2014-12-12 DIAGNOSIS — Z4502 Encounter for adjustment and management of automatic implantable cardiac defibrillator: Secondary | ICD-10-CM

## 2014-12-12 DIAGNOSIS — I482 Chronic atrial fibrillation, unspecified: Secondary | ICD-10-CM

## 2014-12-12 DIAGNOSIS — I5022 Chronic systolic (congestive) heart failure: Secondary | ICD-10-CM

## 2014-12-12 DIAGNOSIS — N184 Chronic kidney disease, stage 4 (severe): Secondary | ICD-10-CM

## 2014-12-12 DIAGNOSIS — I429 Cardiomyopathy, unspecified: Secondary | ICD-10-CM | POA: Diagnosis not present

## 2014-12-12 MED ORDER — INSULIN LISPRO PROT & LISPRO (75-25 MIX) 100 UNIT/ML ~~LOC~~ SUSP: 26.0000 [IU] | Freq: Two times a day (BID) | SUBCUTANEOUS | Status: DC

## 2014-12-12 NOTE — Progress Notes (Signed)
Pre visit review using our clinic review tool, if applicable. No additional management support is needed unless otherwise documented below in the visit note. 

## 2014-12-12 NOTE — Progress Notes (Signed)
Remote ICD transmission.   

## 2014-12-12 NOTE — Progress Notes (Signed)
Subjective:    Patient ID: [name] Devine., male    DOB: 04/13/1931, 81 y.o.   MRN: [id number]  HPI Admit date: 12/05/2014 Discharge date: 12/07/2014  Recommendations for Outpatient Follow-up:  1. Labs pending : Hb A1c.  2. Need repeat B-met to follow renal function 3. Needs to follow up with primary cardiologist  Discharge Diagnoses:   Systolic and diastolic CHF, acute on chronic  Essential hypertension  Chronic atrial fibrillation  BIVentricular Defibrillator--St Jude  Chronic renal insufficiency, stage IV (severe)  Diabetes mellitus with renal complications  Cardiomyopathy, ischemic   Discharge Condition: Stable.   Diet recommendation: renal diet   Wt Readings from Last 3 Encounters:  12/12/14 177 lb (80.287 kg)  12/07/14 168 lb (76.204 kg)  10/02/14 177 lb (80.90 kg)   81 year old patient who is seen today following a recent hospital discharge.  Since his discharge he has done quite well.  There has been some weight gain but no peripheral edema or shortness of breath.  He states that his ejection fraction increased to 30%.  He does have a history of ischemic cardio myopathy.  He has essential hypertension and diabetes.  His next insulin was down titrated to 20 units twice daily.  Hemoglobin A1c was 8.0.  He request form completion for obtaining diabetic shoes.  Past Medical History  Diagnosis Date  . Chronic atrial fibrillation     a. Historically difficult rate control. b. s/p CRT-D implantation with anticipation of AV junction ablation but patient then was able to accomplish rate control and ablation not undertaken.; AVN ablation 08/2013 by Dr Lovena Le  . CARDIOMYOPATHY, PRIMARY, DILATED     a. Mixed ICM/NICM - EF 15% by echo 05/2009; St. Jude CRT-D implanted 06/2009. b. Echo 04/2013 - EF 20%, mild LVH.  [name] Chronic systolic CHF (congestive heart failure)   . CORONARY ARTERY DISEASE     a. BMS to LAD 04/2008. b. Cath 06/2009: nonobstructive disease.  [name] SLEEP  APNEA   . Hypertension   . Hyperlipidemia   . Diabetes mellitus   . Osteoarthritis   . Tubular adenoma of colon   . Secondary hyperparathyroidism   . CKD (chronic kidney disease), stage IV     a. Right brachiocephalic AV fistula 11/5327. Neph = Dunham.  . Vertigo   . Valvular heart disease     a. Echo 04/2013: mild AI, mild MR.  . Pulmonary HTN     a. PA pressure 35mHg by echo 05/01/13.  . Prostate cancer     a. s/p radical prostatectomy.  . Colon cancer   . Skin cancer   . Presence of permanent cardiac pacemaker     History   Social History  . Marital Status: Widowed    Spouse Name: N/A  . Number of Children: 4  . Years of Education: N/A   Occupational History  . retired    Social History Main Topics  . Smoking status: Former Smoker    Types: Cigarettes    Quit date: 09/22/1966  . Smokeless tobacco: Never Used  . Alcohol Use: 0.0 - 0.6 oz/week    0-1 Standard drinks or equivalent per week     Comment: occasional  . Drug Use: No  . Sexual Activity: No   Other Topics Concern  . Not on file   Social History Narrative    Past Surgical History  Procedure Laterality Date  . Colectomy    . Prostatectomy    . Penile prosthesis placement    .  Removed prosthetic eye    . Ptca      stent placed  . Total knee arthroplasty  2008    right  . Pacemaker insertion  2010  . Av fistula placement  03/30/2012    Procedure: ARTERIOVENOUS (AV) FISTULA CREATION;  Surgeon: Angelia Mould, MD;  Location: [place];  Service: Vascular;  Laterality: Right;  Creation of BrachioCephalic Fistula Right arm  . Eye surgery      left eye prothesis  . Fracture surgery      collar bone, right knee replacement  . Bi-ventricular implantable cardioverter defibrillator  (crt-d)  2010    STJ CRTD implanted by Dr [name]  . Ablation  08/2013    AVN ablation by Dr Lovena Le  . Av node ablation N/A 09/12/2013    Procedure: AV NODE ABLATION;  Surgeon: [name], MD;  Location: [facility] CATH LAB;   Service: Cardiovascular;  Laterality: N/A;  . Insert / replace / remove pacemaker    . Joint replacement      Family History  Problem Relation Age of Onset  . Heart disease Father   . Throat cancer Father   . Throat cancer Mother   . Heart disease Mother   . Hypertension Mother     Allergies  Allergen Reactions  . Ace Inhibitors     Stopped by Dr. Burt Knack due to progressive renal failure  . Prednisone Other (See Comments)    Raised blood sugar to almost 900    Current Outpatient Prescriptions on File Prior to Visit  Medication Sig Dispense Refill  . aspirin 81 MG chewable tablet Chew 81 mg by mouth daily.    . calcitRIOL (ROCALTROL) 0.25 MCG capsule Take 0.25 mcg by mouth daily.     [name] DIGOX 125 MCG tablet Take 1 tablet by mouth  every other day 45 tablet 1  . furosemide (LASIX) 80 MG tablet Take 1 tablet by mouth two  times daily 180 tablet 1  . glucose blood (FREESTYLE LITE) test strip 1 each by Other route 2 (two) times daily as needed for other. Dx: 250.00 100 each 12  . hydrALAZINE (APRESOLINE) 25 MG tablet Take 1 and 1/2 tablets by  mouth 3 times daily 360 tablet 1  . isosorbide mononitrate (IMDUR) 30 MG 24 hr tablet Take 0.5 tablets (15 mg total) by mouth daily. 90 tablet 1  . Lancets (FREESTYLE) lancets 1 each by Other route 2 (two) times daily as needed for other. 100 each 12  . metoprolol succinate (TOPROL-XL) 100 MG 24 hr tablet Take 1 tablet by mouth  twice a day with or  immediately following meal 180 tablet 1  . nitroGLYCERIN (NITROSTAT) 0.4 MG SL tablet Place 0.4 mg under the tongue every 5 (five) minutes x 3 doses as needed for chest pain.    . potassium chloride SA (K-DUR,KLOR-CON) 20 MEQ tablet Take 2 tablets (40 mEq total) by mouth daily. 180 tablet 3  . warfarin (COUMADIN) 5 MG tablet On Monday and Friday take 2.5 mg; On Tuesday, Wednesday, Thursday, Saturday, and Sunday takes 5 mg. Resume home dose on 3-18 90 tablet 1   No current facility-administered  medications on file prior to visit.    BP 160/80 mmHg  Pulse 75  Temp(Src) 98.1 F (36.7 C) (Oral)  Resp 20  Ht 6' (1.829 m)  Wt 177 lb (80.287 kg)  BMI 24.00 kg/m2  SpO2 97%      Review of Systems  Constitutional: Negative for fever,  chills, appetite change and fatigue.  HENT: Negative for congestion, dental problem, ear pain, hearing loss, sore throat, tinnitus, trouble swallowing and voice change.   Eyes: Negative for pain, discharge and visual disturbance.  Respiratory: Positive for shortness of breath. Negative for cough, chest tightness, wheezing and stridor.   Cardiovascular: Negative for chest pain, palpitations and leg swelling.  Gastrointestinal: Negative for nausea, vomiting, abdominal pain, diarrhea, constipation, blood in stool and abdominal distention.  Genitourinary: Negative for urgency, hematuria, flank pain, discharge, difficulty urinating and genital sores.  Musculoskeletal: Negative for myalgias, back pain, joint swelling, arthralgias, gait problem and neck stiffness.  Skin: Negative for rash.  Neurological: Negative for dizziness, syncope, speech difficulty, weakness, numbness and headaches.  Hematological: Negative for adenopathy. Does not bruise/bleed easily.  Psychiatric/Behavioral: Negative for behavioral problems and dysphoric mood. The patient is not nervous/anxious.        Objective:   Physical Exam  Constitutional: He is oriented to person, place, and time. He appears well-developed.  Blood pressure 140/64  HENT:  Head: Normocephalic.  Right Ear: External ear normal.  Left Ear: External ear normal.  Eyes: Conjunctivae and EOM are normal.  Neck: Normal range of motion.  Cardiovascular: Normal rate and normal heart sounds.   Pulmonary/Chest: Breath sounds normal. He has no rales.  Abdominal: Bowel sounds are normal.  Musculoskeletal: Normal range of motion. He exhibits no edema or tenderness.  Neurological: He is alert and oriented to person,  place, and time.  Psychiatric: He has a normal mood and affect. His behavior is normal.          Assessment & Plan:     diastolic heart failure.  Appears compensated  Hypertension, stable  Diabetes mellitus.  Will increase next insulin to 26 units twice daily.  His prior dose.  Recent hemoglobin A1c 8.0.  Forms completed for obtaining diabetic shoes  Chronic atrial fibrillation.  Continue Coumadin anticoagulation   Recheck 3 months  Low-salt diet.  Encouraged

## 2014-12-12 NOTE — Patient Instructions (Addendum)
Limit your sodium (Salt) intake  Follow-up renal medicine and cardiology  Notify of any significant weight gain  Increase insulin to 26 units twice daily  Return in 3 months for follow-up

## 2014-12-14 ENCOUNTER — Ambulatory Visit: Payer: Medicare Other

## 2014-12-15 LAB — MDC_IDC_ENUM_SESS_TYPE_REMOTE
Battery Remaining Percentage: 37 %
Battery Voltage: 2.87 V
Brady Statistic RV Percent Paced: 88 %
Date Time Interrogation Session: 20160322092548
HighPow Impedance: 39 Ohm
Implantable Pulse Generator Serial Number: 595859
Lead Channel Impedance Value: 380 Ohm
Lead Channel Impedance Value: 400 Ohm
Lead Channel Pacing Threshold Amplitude: 1.75 V
Lead Channel Setting Pacing Amplitude: 3 V
Lead Channel Setting Pacing Pulse Width: 0.6 ms
MDC IDC MSMT BATTERY REMAINING LONGEVITY: 20 mo
MDC IDC MSMT LEADCHNL LV PACING THRESHOLD PULSEWIDTH: 1 ms
MDC IDC MSMT LEADCHNL RV PACING THRESHOLD AMPLITUDE: 1 V
MDC IDC MSMT LEADCHNL RV PACING THRESHOLD PULSEWIDTH: 0.6 ms
MDC IDC MSMT LEADCHNL RV SENSING INTR AMPL: 4.6 mV
MDC IDC SET LEADCHNL LV PACING PULSEWIDTH: 1 ms
MDC IDC SET LEADCHNL RV PACING AMPLITUDE: 2.5 V
MDC IDC SET LEADCHNL RV SENSING SENSITIVITY: 0.5 mV
MDC IDC SET ZONE DETECTION INTERVAL: 250 ms
Zone Setting Detection Interval: 300 ms

## 2014-12-20 ENCOUNTER — Telehealth: Payer: Self-pay | Admitting: Internal Medicine

## 2014-12-20 NOTE — Telephone Encounter (Signed)
New message      Did we get his remote transmission last week?

## 2014-12-20 NOTE — Telephone Encounter (Signed)
LMOM to let patient know remote transmission was received last week.

## 2015-01-03 ENCOUNTER — Encounter: Payer: Self-pay | Admitting: Internal Medicine

## 2015-01-08 ENCOUNTER — Ambulatory Visit (INDEPENDENT_AMBULATORY_CARE_PROVIDER_SITE_OTHER): Payer: Medicare Other | Admitting: General Practice

## 2015-01-08 DIAGNOSIS — Z5181 Encounter for therapeutic drug level monitoring: Secondary | ICD-10-CM

## 2015-01-08 DIAGNOSIS — I482 Chronic atrial fibrillation, unspecified: Secondary | ICD-10-CM

## 2015-01-08 LAB — POCT INR: INR: 2.4

## 2015-01-08 NOTE — Progress Notes (Signed)
Pre visit review using our clinic review tool, if applicable. No additional management support is needed unless otherwise documented below in the visit note. 

## 2015-01-29 ENCOUNTER — Ambulatory Visit (INDEPENDENT_AMBULATORY_CARE_PROVIDER_SITE_OTHER): Payer: Medicare Other | Admitting: Internal Medicine

## 2015-01-29 ENCOUNTER — Encounter: Payer: Self-pay | Admitting: Internal Medicine

## 2015-01-29 VITALS — BP 140/70 | HR 76 | Temp 98.4°F | Resp 20 | Ht 72.0 in | Wt 181.0 lb

## 2015-01-29 DIAGNOSIS — M25512 Pain in left shoulder: Secondary | ICD-10-CM

## 2015-01-29 DIAGNOSIS — M150 Primary generalized (osteo)arthritis: Secondary | ICD-10-CM | POA: Diagnosis not present

## 2015-01-29 DIAGNOSIS — I482 Chronic atrial fibrillation, unspecified: Secondary | ICD-10-CM

## 2015-01-29 DIAGNOSIS — M159 Polyosteoarthritis, unspecified: Secondary | ICD-10-CM

## 2015-01-29 DIAGNOSIS — N184 Chronic kidney disease, stage 4 (severe): Secondary | ICD-10-CM

## 2015-01-29 DIAGNOSIS — N189 Chronic kidney disease, unspecified: Secondary | ICD-10-CM

## 2015-01-29 NOTE — Progress Notes (Signed)
Pre visit review using our clinic review tool, if applicable. No additional management support is needed unless otherwise documented below in the visit note. 

## 2015-01-29 NOTE — Progress Notes (Signed)
Subjective:    Patient ID: [name] [name]., male    DOB: 1931-05-08, 81 y.o.   MRN: [id number]  HPI  Wt Readings from Last 3 Encounters:  01/29/15 181 lb (82.101 kg)  12/12/14 177 lb (80.287 kg)  12/07/14 168 lb (76.45 kg)   81 year old patient who is seen today for follow-up of left shoulder pain.  This has been a problem since a fall in October of last year.  His pain has worsened and now interferes with sleep nightly.  He has a difficult time raising his arm due to discomfort.  Past Medical History  Diagnosis Date  . Chronic atrial fibrillation     a. Historically difficult rate control. b. s/p CRT-D implantation with anticipation of AV junction ablation but patient then was able to accomplish rate control and ablation not undertaken.; AVN ablation 08/2013 by Dr Lovena Le  . CARDIOMYOPATHY, PRIMARY, DILATED     a. Mixed ICM/NICM - EF 15% by echo 05/2009; St. Jude CRT-D implanted 06/2009. b. Echo 04/2013 - EF 20%, mild LVH.  [name] Chronic systolic CHF (congestive heart failure)   . CORONARY ARTERY DISEASE     a. BMS to LAD 04/2008. b. Cath 06/2009: nonobstructive disease.  [name] SLEEP APNEA   . Hypertension   . Hyperlipidemia   . Diabetes mellitus   . Osteoarthritis   . Tubular adenoma of colon   . Secondary hyperparathyroidism   . CKD (chronic kidney disease), stage IV     a. Right brachiocephalic AV fistula 10/8364. Neph = Dunham.  . Vertigo   . Valvular heart disease     a. Echo 04/2013: mild AI, mild MR.  . Pulmonary HTN     a. PA pressure 81mmHg by echo 05/01/13.  . Prostate cancer     a. s/p radical prostatectomy.  . Colon cancer   . Skin cancer   . Presence of permanent cardiac pacemaker     History   Social History  . Marital Status: Widowed    Spouse Name: N/A  . Number of Children: 4  . Years of Education: N/A   Occupational History  . retired    Social History Main Topics  . Smoking status: Former Smoker    Types: Cigarettes    Quit date: 09/22/1966  .  Smokeless tobacco: Never Used  . Alcohol Use: 0.0 - 0.6 oz/week    0-1 Standard drinks or equivalent per week     Comment: occasional  . Drug Use: No  . Sexual Activity: No   Other Topics Concern  . Not on file   Social History Narrative    Past Surgical History  Procedure Laterality Date  . Colectomy    . Prostatectomy    . Penile prosthesis placement    . Removed prosthetic eye    . Ptca      stent placed  . Total knee arthroplasty  2008    right  . Pacemaker insertion  2010  . Av fistula placement  03/30/2012    Procedure: ARTERIOVENOUS (AV) FISTULA CREATION;  Surgeon: Angelia Mould, MD;  Location: [place];  Service: Vascular;  Laterality: Right;  Creation of BrachioCephalic Fistula Right arm  . Eye surgery      left eye prothesis  . Fracture surgery      collar bone, right knee replacement  . Bi-ventricular implantable cardioverter defibrillator  (crt-d)  2010    STJ CRTD implanted by Dr [name]  . Ablation  08/2013  AVN ablation by Dr Lovena Le  . Av node ablation N/A 09/12/2013    Procedure: AV NODE ABLATION;  Surgeon: [name], MD;  Location[facility] C[facility] LAB;  Service: Cardiovascular;  Laterality: N/A;  . Insert / replace / remove pacemaker    . Joint replacement      Family History  Problem Relation Age of Onset  . Heart disease Father   . Throat cancer Father   . Throat cancer Mother   . Heart disease Mother   . Hypertension Mother     Allergies  Allergen Reactions  . Ace Inhibitors     Stopped by Dr. Burt Knack due to progressive renal failure  . Prednisone Other (See Comments)    Raised blood sugar to almost 900    Current Outpatient Prescriptions on File Prior to Visit  Medication Sig Dispense Refill  . acetaminophen (TYLENOL) 500 MG tablet Take 500 mg by mouth as needed.    [name] aspirin 81 MG chewable tablet Chew 81 mg by mouth daily.    . calcitRIOL (ROCALTROL) 0.25 MCG capsule Take 0.25 mcg by mouth daily.     [name] DIGOX 125 MCG tablet Take 1  tablet by mouth  every other day 45 tablet 1  . furosemide (LASIX) 80 MG tablet Take 1 tablet by mouth two  times daily 180 tablet 1  . glucose blood (FREESTYLE LITE) test strip 1 each by Other route 2 (two) times daily as needed for other. Dx: 250.00 100 each 12  . hydrALAZINE (APRESOLINE) 25 MG tablet Take 1 and 1/2 tablets by  mouth 3 times daily 360 tablet 1  . insulin lispro protamine-lispro (HUMALOG 75/25 MIX) (75-25) 100 UNIT/ML SUSP injection Inject 26 Units into the skin 2 (two) times daily with a meal. 6 vial 1  . isosorbide mononitrate (IMDUR) 30 MG 24 hr tablet Take 0.5 tablets (15 mg total) by mouth daily. 90 tablet 1  . Lancets (FREESTYLE) lancets 1 each by Other route 2 (two) times daily as needed for other. 100 each 12  . metoprolol succinate (TOPROL-XL) 100 MG 24 hr tablet Take 1 tablet by mouth  twice a day with or  immediately following meal 180 tablet 1  . nitroGLYCERIN (NITROSTAT) 0.4 MG SL tablet Place 0.4 mg under the tongue every 5 (five) minutes x 3 doses as needed for chest pain.    . potassium chloride SA (K-DUR,KLOR-CON) 20 MEQ tablet Take 2 tablets (40 mEq total) by mouth daily. 180 tablet 3  . warfarin (COUMADIN) 5 MG tablet On Monday and Friday take 2.5 mg; On Tuesday, Wednesday, Thursday, Saturday, and Sunday takes 5 mg. Resume home dose on 3-18 90 tablet 1   No current facility-administered medications on file prior to visit.    BP 140/70 mmHg  Pulse 76  Temp(Src) 98.4 F (36.9 C) (Oral)  Resp 20  Ht 6' (1.829 m)  Wt 181 lb (82.101 kg)  BMI 24.54 kg/m2  SpO2 98%     Review of Systems  Constitutional: Negative for fever, chills, appetite change and fatigue.  HENT: Negative for congestion, dental problem, ear pain, hearing loss, sore throat, tinnitus, trouble swallowing and voice change.   Eyes: Negative for pain, discharge and visual disturbance.  Respiratory: Negative for cough, chest tightness, wheezing and stridor.   Cardiovascular: Negative for  chest pain, palpitations and leg swelling.  Gastrointestinal: Negative for nausea, vomiting, abdominal pain, diarrhea, constipation, blood in stool and abdominal distention.  Genitourinary: Negative for urgency, hematuria, flank pain,  discharge, difficulty urinating and genital sores.  Musculoskeletal: Negative for myalgias, back pain, joint swelling, arthralgias, gait problem and neck stiffness.       Chronic posttraumatic left shoulder pain since October 2015  Skin: Negative for rash.  Neurological: Negative for dizziness, syncope, speech difficulty, weakness, numbness and headaches.  Hematological: Negative for adenopathy. Does not bruise/bleed easily.  Psychiatric/Behavioral: Negative for behavioral problems and dysphoric mood. The patient is not nervous/anxious.        Objective:   Physical Exam  Constitutional: He appears well-developed and well-nourished. No distress.  Musculoskeletal:  Internal rotation lag test was normal Positive painful arc test Positive external rotation resistance test And drop arm test          Assessment & Plan:   Chronic left shoulder pain.  Probable rotator cuff tendinopathy.  Will set up for orthopedic follow-up Chronic atrial fibrillation Congestive heart failure, stable

## 2015-01-29 NOTE — Patient Instructions (Signed)
Orthopedic follow-up as discussed  Limit your sodium (Salt) intake   Please check your hemoglobin A1c every 3 months

## 2015-02-05 ENCOUNTER — Ambulatory Visit (INDEPENDENT_AMBULATORY_CARE_PROVIDER_SITE_OTHER): Payer: Medicare Other | Admitting: General Practice

## 2015-02-05 DIAGNOSIS — I482 Chronic atrial fibrillation, unspecified: Secondary | ICD-10-CM

## 2015-02-05 DIAGNOSIS — Z5181 Encounter for therapeutic drug level monitoring: Secondary | ICD-10-CM

## 2015-02-05 LAB — POCT INR: INR: 2.1

## 2015-02-05 NOTE — Progress Notes (Signed)
Pre visit review using our clinic review tool, if applicable. No additional management support is needed unless otherwise documented below in the visit note. 

## 2015-02-21 ENCOUNTER — Encounter: Payer: Self-pay | Admitting: Internal Medicine

## 2015-02-21 ENCOUNTER — Ambulatory Visit (INDEPENDENT_AMBULATORY_CARE_PROVIDER_SITE_OTHER): Payer: Medicare Other | Admitting: Internal Medicine

## 2015-02-21 DIAGNOSIS — B351 Tinea unguium: Secondary | ICD-10-CM

## 2015-02-21 DIAGNOSIS — I10 Essential (primary) hypertension: Secondary | ICD-10-CM | POA: Diagnosis not present

## 2015-02-21 MED ORDER — METHYLPREDNISOLONE ACETATE 80 MG/ML IJ SUSP
40.0000 mg | Freq: Once | INTRAMUSCULAR | Status: AC
  Administered 2015-02-21: 40 mg via INTRAMUSCULAR

## 2015-02-21 NOTE — Progress Notes (Signed)
Subjective:    Patient ID: [name] Sandt., male    DOB: 1931-07-17, 81 y.o.   MRN: [id number]  HPI  81 year old patient who does have a history of osteoarthritis.  Multiple medical problems include diabetes, chronic kidney disease and cardiomyopathy.  He did receive an injection for a left shoulder bursitis.  Approximate 10 days ago which was quite helpful and well tolerated.  He did see a short-term predictable increase in blood sugars, however. For the past 2 days he has had considerable pain in the left buttock area.  He also notes pain involving the left lateral upper leg.  No motor weakness.  Past Medical History  Diagnosis Date  . Chronic atrial fibrillation     a. Historically difficult rate control. b. s/p CRT-D implantation with anticipation of AV junction ablation but patient then was able to accomplish rate control and ablation not undertaken.; AVN ablation 08/2013 by Dr Lovena Le  . CARDIOMYOPATHY, PRIMARY, DILATED     a. Mixed ICM/NICM - EF 15% by echo 05/2009; St. Jude CRT-D implanted 06/2009. b. Echo 04/2013 - EF 20%, mild LVH.  [name] Chronic systolic CHF (congestive heart failure)   . CORONARY ARTERY DISEASE     a. BMS to LAD 04/2008. b. Cath 06/2009: nonobstructive disease.  [name] SLEEP APNEA   . Hypertension   . Hyperlipidemia   . Diabetes mellitus   . Osteoarthritis   . Tubular adenoma of colon   . Secondary hyperparathyroidism   . CKD (chronic kidney disease), stage IV     a. Right brachiocephalic AV fistula 05/3266. Neph = Dunham.  . Vertigo   . Valvular heart disease     a. Echo 04/2013: mild AI, mild MR.  . Pulmonary HTN     a. PA pressure 47mmHg by echo 05/01/13.  . Prostate cancer     a. s/p radical prostatectomy.  . Colon cancer   . Skin cancer   . Presence of permanent cardiac pacemaker     History   Social History  . Marital Status: Widowed    Spouse Name: N/A  . Number of Children: 4  . Years of Education: N/A   Occupational History  . retired     Social History Main Topics  . Smoking status: Former Smoker    Types: Cigarettes    Quit date: 09/22/1966  . Smokeless tobacco: Never Used  . Alcohol Use: 0.0 - 0.6 oz/week    0-1 Standard drinks or equivalent per week     Comment: occasional  . Drug Use: No  . Sexual Activity: No   Other Topics Concern  . Not on file   Social History Narrative    Past Surgical History  Procedure Laterality Date  . Colectomy    . Prostatectomy    . Penile prosthesis placement    . Removed prosthetic eye    . Ptca      stent placed  . Total knee arthroplasty  2008    right  . Pacemaker insertion  2010  . Av fistula placement  03/30/2012    Procedure: ARTERIOVENOUS (AV) FISTULA CREATION;  Surgeon: Angelia Mould, MD;  Location: [place];  Service: Vascular;  Laterality: Right;  Creation of BrachioCephalic Fistula Right arm  . Eye surgery      left eye prothesis  . Fracture surgery      collar bone, right knee replacement  . Bi-ventricular implantable cardioverter defibrillator  (crt-d)  2010    STJ CRTD implanted by  Dr [name]  . Ablation  08/2013    AVN ablation by Dr Lovena Le  . Av node ablation N/A 09/12/2013    Procedure: AV NODE ABLATION;  Surgeon: [name], MD;  [facility] C[facility] LAB;  Service: Cardiovascular;  Laterality: N/A;  . Insert / replace / remove pacemaker    . Joint replacement      Family History  Problem Relation Age of Onset  . Heart disease Father   . Throat cancer Father   . Throat cancer Mother   . Heart disease Mother   . Hypertension Mother     Allergies  Allergen Reactions  . Ace Inhibitors     Stopped by Dr. Burt Knack due to progressive renal failure  . Prednisone Other (See Comments)    Raised blood sugar to almost 900    Current Outpatient Prescriptions on File Prior to Visit  Medication Sig Dispense Refill  . acetaminophen (TYLENOL) 500 MG tablet Take 500 mg by mouth as needed.    [name] aspirin 81 MG chewable tablet Chew 81 mg by mouth  daily.    . calcitRIOL (ROCALTROL) 0.25 MCG capsule Take 0.25 mcg by mouth daily.     [name] DIGOX 125 MCG tablet Take 1 tablet by mouth  every other day 45 tablet 1  . furosemide (LASIX) 80 MG tablet Take 1 tablet by mouth two  times daily 180 tablet 1  . glucose blood (FREESTYLE LITE) test strip 1 each by Other route 2 (two) times daily as needed for other. Dx: 250.00 100 each 12  . hydrALAZINE (APRESOLINE) 25 MG tablet Take 1 and 1/2 tablets by  mouth 3 times daily 360 tablet 1  . insulin lispro protamine-lispro (HUMALOG 75/25 MIX) (75-25) 100 UNIT/ML SUSP injection Inject 26 Units into the skin 2 (two) times daily with a meal. 6 vial 1  . isosorbide mononitrate (IMDUR) 30 MG 24 hr tablet Take 0.5 tablets (15 mg total) by mouth daily. 90 tablet 1  . Lancets (FREESTYLE) lancets 1 each by Other route 2 (two) times daily as needed for other. 100 each 12  . metoprolol succinate (TOPROL-XL) 100 MG 24 hr tablet Take 1 tablet by mouth  twice a day with or  immediately following meal 180 tablet 1  . nitroGLYCERIN (NITROSTAT) 0.4 MG SL tablet Place 0.4 mg under the tongue every 5 (five) minutes x 3 doses as needed for chest pain.    . potassium chloride SA (K-DUR,KLOR-CON) 20 MEQ tablet Take 2 tablets (40 mEq total) by mouth daily. 180 tablet 3  . warfarin (COUMADIN) 5 MG tablet On Monday and Friday take 2.5 mg; On Tuesday, Wednesday, Thursday, Saturday, and Sunday takes 5 mg. Resume home dose on 3-18 90 tablet 1   No current facility-administered medications on file prior to visit.    BP 140/80 mmHg  Pulse 75  Temp(Src) 97.4 F (36.3 C) (Oral)  Resp 20  Ht 6' (1.829 m)  Wt 178 lb (80.74 kg)  BMI 24.14 kg/m2  SpO2 98%     Review of Systems  Constitutional: Negative for fever, chills, appetite change and fatigue.  HENT: Negative for congestion, dental problem, ear pain, hearing loss, sore throat, tinnitus, trouble swallowing and voice change.   Eyes: Negative for pain, discharge and visual  disturbance.  Respiratory: Negative for cough, chest tightness, wheezing and stridor.   Cardiovascular: Negative for chest pain, palpitations and leg swelling.  Gastrointestinal: Negative for nausea, vomiting, abdominal pain, diarrhea, constipation, blood in stool and  abdominal distention.  Genitourinary: Negative for urgency, hematuria, flank pain, discharge, difficulty urinating and genital sores.  Musculoskeletal: Positive for back pain. Negative for myalgias, joint swelling, arthralgias, gait problem and neck stiffness.  Skin: Negative for rash.  Neurological: Negative for dizziness, syncope, speech difficulty, weakness, numbness and headaches.  Hematological: Negative for adenopathy. Does not bruise/bleed easily.  Psychiatric/Behavioral: Negative for behavioral problems and dysphoric mood. The patient is not nervous/anxious.        Objective:   Physical Exam  Constitutional: He appears well-developed and well-nourished. No distress.  Musculoskeletal:  Full range of motion of the hips Status post right total knee replacement Straight leg test on the left did tend aggravate discomfort in the left thigh region          Assessment & Plan:   Left buttock and left lateral leg pain.  Possible mild radiculopathy.  Will treat with low-dose Deprol Medrol.  He has had 2 uncomfortable nights.  He will track blood sugars more carefully and adjust insulin as needed.  Short-term Diabetes mellitus End-stage renal disease Essential hypertension, stable

## 2015-02-21 NOTE — Addendum Note (Signed)
Addended by: Marian Sorrow on: 02/21/2015 12:00 PM   Modules accepted: Orders

## 2015-02-21 NOTE — Patient Instructions (Signed)
Most patients with low back pain will improve with time over the next two to 6 weeks.  Keep active but avoid any activities that cause pain.  Apply moist heat to the low back area several times daily.  Call if you develop  any new or worsening symptoms

## 2015-02-21 NOTE — Progress Notes (Signed)
Pre visit review using our clinic review tool, if applicable. No additional management support is needed unless otherwise documented below in the visit note. 

## 2015-02-23 ENCOUNTER — Emergency Department (HOSPITAL_BASED_OUTPATIENT_CLINIC_OR_DEPARTMENT_OTHER): Payer: Medicare Other

## 2015-02-23 ENCOUNTER — Emergency Department (HOSPITAL_COMMUNITY): Payer: Medicare Other

## 2015-02-23 ENCOUNTER — Encounter (HOSPITAL_COMMUNITY): Payer: Self-pay

## 2015-02-23 ENCOUNTER — Emergency Department (HOSPITAL_COMMUNITY)
Admission: EM | Admit: 2015-02-23 | Discharge: 2015-02-23 | Disposition: A | Discharge status: Home / Self Care | Payer: Medicare Other | Attending: Emergency Medicine | Admitting: Emergency Medicine

## 2015-02-23 DIAGNOSIS — E119 Type 2 diabetes mellitus without complications: Secondary | ICD-10-CM | POA: Diagnosis not present

## 2015-02-23 DIAGNOSIS — I5022 Chronic systolic (congestive) heart failure: Secondary | ICD-10-CM | POA: Diagnosis not present

## 2015-02-23 DIAGNOSIS — Z8546 Personal history of malignant neoplasm of prostate: Secondary | ICD-10-CM | POA: Insufficient documentation

## 2015-02-23 DIAGNOSIS — Z86018 Personal history of other benign neoplasm: Secondary | ICD-10-CM | POA: Insufficient documentation

## 2015-02-23 DIAGNOSIS — Z950 Presence of cardiac pacemaker: Secondary | ICD-10-CM | POA: Diagnosis not present

## 2015-02-23 DIAGNOSIS — Z79899 Other long term (current) drug therapy: Secondary | ICD-10-CM | POA: Insufficient documentation

## 2015-02-23 DIAGNOSIS — Z85828 Personal history of other malignant neoplasm of skin: Secondary | ICD-10-CM | POA: Diagnosis not present

## 2015-02-23 DIAGNOSIS — Z7951 Long term (current) use of inhaled steroids: Secondary | ICD-10-CM | POA: Insufficient documentation

## 2015-02-23 DIAGNOSIS — M25552 Pain in left hip: Secondary | ICD-10-CM | POA: Diagnosis present

## 2015-02-23 DIAGNOSIS — E785 Hyperlipidemia, unspecified: Secondary | ICD-10-CM | POA: Diagnosis not present

## 2015-02-23 DIAGNOSIS — Z7982 Long term (current) use of aspirin: Secondary | ICD-10-CM | POA: Diagnosis not present

## 2015-02-23 DIAGNOSIS — N184 Chronic kidney disease, stage 4 (severe): Secondary | ICD-10-CM | POA: Insufficient documentation

## 2015-02-23 DIAGNOSIS — M1990 Unspecified osteoarthritis, unspecified site: Secondary | ICD-10-CM | POA: Insufficient documentation

## 2015-02-23 DIAGNOSIS — Z85038 Personal history of other malignant neoplasm of large intestine: Secondary | ICD-10-CM | POA: Diagnosis not present

## 2015-02-23 DIAGNOSIS — Z8669 Personal history of other diseases of the nervous system and sense organs: Secondary | ICD-10-CM | POA: Diagnosis not present

## 2015-02-23 DIAGNOSIS — M25559 Pain in unspecified hip: Secondary | ICD-10-CM

## 2015-02-23 DIAGNOSIS — Z794 Long term (current) use of insulin: Secondary | ICD-10-CM | POA: Insufficient documentation

## 2015-02-23 DIAGNOSIS — Z87891 Personal history of nicotine dependence: Secondary | ICD-10-CM | POA: Diagnosis not present

## 2015-02-23 DIAGNOSIS — I2510 Atherosclerotic heart disease of native coronary artery without angina pectoris: Secondary | ICD-10-CM | POA: Diagnosis not present

## 2015-02-23 DIAGNOSIS — M79609 Pain in unspecified limb: Secondary | ICD-10-CM | POA: Diagnosis not present

## 2015-02-23 DIAGNOSIS — Z8673 Personal history of transient ischemic attack (TIA), and cerebral infarction without residual deficits: Secondary | ICD-10-CM | POA: Diagnosis not present

## 2015-02-23 DIAGNOSIS — I129 Hypertensive chronic kidney disease with stage 1 through stage 4 chronic kidney disease, or unspecified chronic kidney disease: Secondary | ICD-10-CM | POA: Diagnosis not present

## 2015-02-23 LAB — BASIC METABOLIC PANEL
Anion gap: 12 (ref 5–15)
BUN: 72 mg/dL — AB (ref 6–20)
CO2: 28 mmol/L (ref 22–32)
CREATININE: 3.98 mg/dL — AB (ref 0.61–1.24)
Calcium: 10.3 mg/dL (ref 8.9–10.3)
Chloride: 103 mmol/L (ref 101–111)
GFR calc Af Amer: 15 mL/min — ABNORMAL LOW (ref >60)
GFR calc non Af Amer: 13 mL/min — ABNORMAL LOW (ref >60)
Glucose, Bld: 164 mg/dL — ABNORMAL HIGH (ref 65–99)
Potassium: 3.6 mmol/L (ref 3.5–5.1)
Sodium: 143 mmol/L (ref 135–145)

## 2015-02-23 LAB — CBC WITH DIFFERENTIAL/PLATELET
Basophils Absolute: 0 10*3/uL (ref 0.0–0.1)
Basophils Relative: 0 % (ref 0–1)
EOS ABS: 0.1 10*3/uL (ref 0.0–0.7)
Eosinophils Relative: 1 % (ref 0–5)
HEMATOCRIT: 40.3 % (ref 39.0–52.0)
Hemoglobin: 13 g/dL (ref 13.0–17.0)
Lymphocytes Relative: 16 % (ref 12–46)
Lymphs Abs: 1.1 10*3/uL (ref 0.7–4.0)
MCH: 31 pg (ref 26.0–34.0)
MCHC: 32.3 g/dL (ref 30.0–36.0)
MCV: 96.2 fL (ref 78.0–100.0)
Monocytes Absolute: 0.4 10*3/uL (ref 0.1–1.0)
Monocytes Relative: 5 % (ref 3–12)
NEUTROS ABS: 5.4 10*3/uL (ref 1.7–7.7)
Neutrophils Relative %: 78 % — ABNORMAL HIGH (ref 43–77)
Platelets: 133 10*3/uL — ABNORMAL LOW (ref 150–400)
RBC: 4.19 MIL/uL — AB (ref 4.22–5.81)
RDW: 15.2 % (ref 11.5–15.5)
WBC: 7 10*3/uL (ref 4.0–10.5)

## 2015-02-23 LAB — PROTIME-INR
INR: 2.14 — ABNORMAL HIGH (ref 0.00–1.49)
Prothrombin Time: 23.7 seconds — ABNORMAL HIGH (ref 11.6–15.2)

## 2015-02-23 LAB — D-DIMER, QUANTITATIVE (NOT AT ARMC): D-Dimer, Quant: 3.15 ug/mL-FEU — ABNORMAL HIGH (ref 0.00–0.48)

## 2015-02-23 MED ORDER — DOCUSATE SODIUM 100 MG PO CAPS: 100.0000 mg | ORAL_CAPSULE | Freq: Every day | ORAL | Status: DC | PRN

## 2015-02-23 MED ORDER — OXYCODONE-ACETAMINOPHEN 5-325 MG PO TABS: 1.0000 | ORAL_TABLET | Freq: Four times a day (QID) | ORAL | Status: DC | PRN

## 2015-02-23 MED ORDER — HYDROCODONE-ACETAMINOPHEN 5-325 MG PO TABS
1.0000 | ORAL_TABLET | Freq: Once | ORAL | Status: AC
  Administered 2015-02-23: 1 via ORAL
  Filled 2015-02-23: qty 1

## 2015-02-23 MED ORDER — HYDROCODONE-ACETAMINOPHEN 5-325 MG PO TABS: 1.0000 | ORAL_TABLET | Freq: Four times a day (QID) | ORAL | Status: DC | PRN

## 2015-02-23 MED ORDER — HYDROMORPHONE HCL 1 MG/ML IJ SOLN
1.0000 mg | Freq: Once | INTRAMUSCULAR | Status: AC
  Administered 2015-02-23: 1 mg via INTRAMUSCULAR
  Filled 2015-02-23: qty 1

## 2015-02-23 NOTE — Progress Notes (Signed)
VASCULAR LAB PRELIMINARY  PRELIMINARY  PRELIMINARY  PRELIMINARY  Left lower extremity venous duplex completed.    Preliminary report:  Left:  No evidence of DVT, superficial thrombosis, or Baker's cyst.  Babygirl Trager, RVT 02/23/2015, 9:18 AM

## 2015-02-23 NOTE — ED Provider Notes (Signed)
CSN: [id number]     Arrival date & time 02/23/15  0316 History   First MD Initiated Contact with Patient 02/23/15 0321     Chief Complaint  Patient presents with  . Hip Pain     (Consider location/radiation/quality/duration/timing/severity/associated sxs/prior Treatment) HPI  This is an 81 year old male with history of atrial fibrillation on Coumadin, hypertension, hyperlipidemia, and diabetes who presents with left hip and calf pain. Patient reports onset of symptoms on Monday night. Denies any injury. Pain is not worse with range of motion. Was seen by his primary physician who thought that it was nerve related. He received a cortisone injection which did not help. States the pain is worse in his calf. It is 6 out of 10 currently area he took Tylenol at home which did not help. He is on Coumadin for atrial fibrillation. No history of blood clots.  Past Medical History  Diagnosis Date  . Chronic atrial fibrillation     a. Historically difficult rate control. b. s/p CRT-D implantation with anticipation of AV junction ablation but patient then was able to accomplish rate control and ablation not undertaken.; AVN ablation 08/2013 by Dr Lovena Le  . CARDIOMYOPATHY, PRIMARY, DILATED     a. Mixed ICM/NICM - EF 15% by echo 05/2009; St. Jude CRT-D implanted 06/2009. b. Echo 04/2013 - EF 20%, mild LVH.  [name] Chronic systolic CHF (congestive heart failure)   . CORONARY ARTERY DISEASE     a. BMS to LAD 04/2008. b. Cath 06/2009: nonobstructive disease.  [name] SLEEP APNEA   . Hypertension   . Hyperlipidemia   . Diabetes mellitus   . Osteoarthritis   . Tubular adenoma of colon   . Secondary hyperparathyroidism   . CKD (chronic kidney disease), stage IV     a. Right brachiocephalic AV fistula 05/1477. Neph = Dunham.  . Vertigo   . Valvular heart disease     a. Echo 04/2013: mild AI, mild MR.  . Pulmonary HTN     a. PA pressure 33mmHg by echo 05/01/13.  . Prostate cancer     a. s/p radical prostatectomy.  .  Colon cancer   . Skin cancer   . Presence of permanent cardiac pacemaker    Past Surgical History  Procedure Laterality Date  . Colectomy    . Prostatectomy    . Penile prosthesis placement    . Removed prosthetic eye    . Ptca      stent placed  . Total knee arthroplasty  2008    right  . Pacemaker insertion  2010  . Av fistula placement  03/30/2012    Procedure: ARTERIOVENOUS (AV) FISTULA CREATION;  Surgeon: Angelia Mould, MD;  Location: [facility];  Service: Vascular;  Laterality: Right;  Creation of BrachioCephalic Fistula Right arm  . Eye surgery      left eye prothesis  . Fracture surgery      collar bone, right knee replacement  . Bi-ventricular implantable cardioverter defibrillator  (crt-d)  2010    STJ CRTD implanted by Dr [name]  . Ablation  08/2013    AVN ablation by Dr Lovena Le  . Av node ablation N/A 09/12/2013    Procedure: AV NODE ABLATION;  Surgeon: [name], MD;  Location: [facility] CATH LAB;  Service: Cardiovascular;  Laterality: N/A;  . Insert / replace / remove pacemaker    . Joint replacement     Family History  Problem Relation Age of Onset  . Heart disease Father   .  Throat cancer Father   . Throat cancer Mother   . Heart disease Mother   . Hypertension Mother    History  Substance Use Topics  . Smoking status: Former Smoker    Types: Cigarettes    Quit date: 09/22/1966  . Smokeless tobacco: Never Used  . Alcohol Use: 0.0 - 0.6 oz/week    0-1 Standard drinks or equivalent per week     Comment: occasional    Review of Systems  Constitutional: Negative.  Negative for fever.  Respiratory: Negative.  Negative for chest tightness and shortness of breath.   Cardiovascular: Negative.  Negative for chest pain.  Gastrointestinal: Negative.   Genitourinary: Negative.   Musculoskeletal: Negative for back pain.       Calf and hip pain  Skin: Negative for color change and wound.  All other systems reviewed and are negative.     Allergies  Ace  inhibitors and Prednisone  Home Medications   Prior to Admission medications   Medication Sig Start Date End Date Taking? Authorizing Provider  acetaminophen (TYLENOL) 500 MG tablet Take 500 mg by mouth as needed for mild pain.    Yes Historical Provider, MD  albuterol (PROVENTIL HFA;VENTOLIN HFA) 108 (90 BASE) MCG/ACT inhaler Inhale 2 puffs into the lungs every 6 (six) hours as needed for wheezing or shortness of breath.   Yes Historical Provider, MD  allopurinol (ZYLOPRIM) 300 MG tablet Take 300 mg by mouth daily.   Yes Historical Provider, MD  aspirin 81 MG chewable tablet Chew 81 mg by mouth daily.   Yes Historical Provider, MD  benzonatate (TESSALON) 100 MG capsule Take 100 mg by mouth 3 (three) times daily as needed for cough.   Yes Historical Provider, MD  CYANOCOBALAMIN IJ Inject 1,000 mcg as directed every 30 (thirty) days.   Yes Historical Provider, MD  donepezil (ARICEPT) 5 MG tablet Take 5 mg by mouth at bedtime.   Yes Historical Provider, MD  FLUoxetine (PROZAC) 10 MG capsule Take 20 mg by mouth daily.   Yes Historical Provider, MD  Fluticasone-Salmeterol (ADVAIR) 500-50 MCG/DOSE AEPB Inhale 1 puff into the lungs 2 (two) times daily.   Yes Historical Provider, MD  furosemide (LASIX) 80 MG tablet Take 1 tablet by mouth two  times daily 12/04/14  Yes [name], MD  guaifenesin (ROBITUSSIN) 100 MG/5ML syrup Take 200 mg by mouth every 4 (four) hours as needed for cough.   Yes Historical Provider, MD  HYDROcodone-acetaminophen (NORCO) 10-325 MG per tablet Take 1 tablet by mouth every 6 (six) hours as needed for severe pain.   Yes Historical Provider, MD  hydroxypropyl methylcellulose / hypromellose (ISOPTO TEARS / GONIOVISC) 2.5 % ophthalmic solution Place 1 drop into both eyes every 4 (four) hours as needed for dry eyes.   Yes Historical Provider, MD  ipratropium-albuterol (DUONEB) 0.5-2.5 (3) MG/3ML SOLN Take 3 mLs by nebulization 2 (two) times daily.   Yes Historical Provider,  MD  levothyroxine (SYNTHROID, LEVOTHROID) 125 MCG tablet Take 250 mcg by mouth daily before breakfast.   Yes Historical Provider, MD  loratadine (CLARITIN) 10 MG tablet Take 10 mg by mouth daily.   Yes Historical Provider, MD  losartan (COZAAR) 25 MG tablet Take 25 mg by mouth daily.   Yes Historical Provider, MD  metoprolol tartrate (LOPRESSOR) 25 MG tablet Take 12.5 mg by mouth 2 (two) times daily.   Yes Historical Provider, MD  pantoprazole (PROTONIX) 40 MG tablet Take 40 mg by mouth daily.   Yes  Historical Provider, MD  potassium chloride SA (K-DUR,KLOR-CON) 20 MEQ tablet Take 2 tablets (40 mEq total) by mouth daily. Patient taking differently: Take 20 mEq by mouth daily.  12/07/14  Yes Belkys A Regalado, MD  simvastatin (ZOCOR) 40 MG tablet Take 40 mg by mouth daily.   Yes Historical Provider, MD  [place] 125 MCG tablet Take 1 tablet by mouth  every other day Patient not taking: Reported on 02/23/2015 12/04/14   [name], MD  glucose blood (FREESTYLE LITE) test strip 1 each by Other route 2 (two) times daily as needed for other. Dx: 250.00 07/13/14   [name], MD  hydrALAZINE (APRESOLINE) 25 MG tablet Take 1 and 1/2 tablets by  mouth 3 times daily Patient not taking: Reported on 02/23/2015 12/04/14   [name], MD  insulin lispro protamine-lispro (HUMALOG 75/25 MIX) (75-25) 100 UNIT/ML SUSP injection Inject 26 Units into the skin 2 (two) times daily with a meal. Patient not taking: Reported on 02/23/2015 12/12/14   [name], MD  isosorbide mononitrate (IMDUR) 30 MG 24 hr tablet Take 0.5 tablets (15 mg total) by mouth daily. Patient not taking: Reported on 02/23/2015 09/12/14   [name], MD  Lancets (FREESTYLE) lancets 1 each by Other route 2 (two) times daily as needed for other. 02/22/14   [name], MD  metoprolol succinate (TOPROL-XL) 100 MG 24 hr tablet Take 1 tablet by mouth  twice a day with or  immediately following meal Patient not  taking: Reported on 02/23/2015 12/04/14   [name], MD  warfarin (COUMADIN) 5 MG tablet On Monday and Friday take 2.5 mg; On Tuesday, Wednesday, Thursday, Saturday, and Sunday takes 5 mg. Resume home dose on 3-18 Patient not taking: Reported on 02/23/2015 12/07/14   Belkys A Regalado, MD   BP 156/74 mmHg  Pulse 77  Temp(Src) 97.4 F (36.3 C) (Oral)  Resp 19  SpO2 96% Physical Exam  Constitutional: He is oriented to person, place, and time. He appears well-developed and well-nourished. No distress.  Elderly  HENT:  Head: Normocephalic and atraumatic.  Cardiovascular: Normal rate, regular rhythm and normal heart sounds.   No murmur heard. Pulmonary/Chest: Effort normal and breath sounds normal. No respiratory distress. He has no wheezes.  Abdominal: Soft. There is no tenderness.  Musculoskeletal: Normal range of motion. He exhibits no edema.  Normal range of motion of bilateral hips and knees, tenderness to palpation of the left calf, no obvious swelling, no overlying skin changes  Neurological: He is alert and oriented to person, place, and time.  5 out of 5 strength in all 4 extremities  Skin: Skin is warm and dry.  Psychiatric: He has a normal mood and affect.  Nursing note and vitals reviewed.   ED Course  Procedures (including critical care time) Labs Review Labs Reviewed  CBC WITH DIFFERENTIAL/PLATELET - Abnormal; Notable for the following:    RBC 4.19 (*)    Platelets 133 (*)    Neutrophils Relative % 78 (*)    All other components within normal limits  BASIC METABOLIC PANEL - Abnormal; Notable for the following:    Glucose, Bld 164 (*)    BUN 72 (*)    Creatinine, Ser 3.98 (*)    GFR calc non Af Amer 13 (*)    GFR calc Af Amer 15 (*)    All other components within normal limits  PROTIME-INR - Abnormal; Notable for the following:    Prothrombin Time  23.7 (*)    INR 2.14 (*)    All other components within normal limits  D-DIMER, QUANTITATIVE (NOT AT [facility]) -  Abnormal; Notable for the following:    D-Dimer, Quant 3.15 (*)    All other components within normal limits    Imaging Review Dg Hip Unilat With Pelvis 1v Left  02/23/2015   CLINICAL DATA:  Left hip pain. Acute on chronic pain, awoke this morning with severe pain.  EXAM: LEFT HIP (WITH PELVIS) 1 VIEW  COMPARISON:  None.  FINDINGS: The cortical margins of the bony pelvis and left hip are intact. No fracture. Pubic symphysis and sacroiliac joints are congruent. Both femoral heads are well-seated in the respective acetabula. There is mild osteoarthritis of both hips with acetabular osteophytes. Vascular calcifications and penile prosthesis, incidentally noted.  IMPRESSION: Mild osteoarthritis of both hips, no acute bony abnormality.   Electronically Signed   By: Jeb Levering M.D.   On: 02/23/2015 04:08     EKG Interpretation None      MDM   Final diagnoses:  Hip pain    Patient presents with hip and calf pain of the left leg. Unprovoked and no known injury. Normal range of motion and no obvious deformities. Neurovascularly intact. He does have tenderness over the calf.  He is on Coumadin and his INR is 2.14. However, this does not preclude him from blood clot. D-dimer is elevated at 3.15. Will hold for ultrasound later this morning.    Merryl Hacker, MD 02/23/15 [phone]

## 2015-02-23 NOTE — ED Notes (Signed)
Patient arrived via EMS after he was unable to sleep tonight due to increased left hip/leg pain.  Patient reports that he has had hip/thigh pain for several weeks and has received Cortisone injections at his PMD for same.  Says tonight pain is now in his calf.  Rates pain at 6/10 constantly, with occasional periods of severe 10/10 pain.

## 2015-02-23 NOTE — ED Provider Notes (Signed)
Ultrasound negative for DVT. Patient's pain is controlled, he is ambulatory. Will recommend rest and pain control for the next few days and close follow-up with his primary care doctor.  Clinical Impression 1. Hip pain      Pamella Pert, MD 02/23/15 1238

## 2015-02-23 NOTE — Discharge Instructions (Signed)

## 2015-02-23 NOTE — ED Notes (Signed)
Follow up Vascular should arrive around 9-9:15

## 2015-02-23 NOTE — ED Notes (Signed)
Bed: WA10 Expected date:  Expected time:  Means of arrival:  Comments: EMS 

## 2015-02-26 ENCOUNTER — Ambulatory Visit (INDEPENDENT_AMBULATORY_CARE_PROVIDER_SITE_OTHER): Payer: Medicare Other | Admitting: Internal Medicine

## 2015-02-26 ENCOUNTER — Encounter: Payer: Self-pay | Admitting: Internal Medicine

## 2015-02-26 VITALS — BP 150/80 | HR 76 | Temp 98.3°F | Resp 20 | Ht 72.0 in | Wt 175.0 lb

## 2015-02-26 DIAGNOSIS — I255 Ischemic cardiomyopathy: Secondary | ICD-10-CM

## 2015-02-26 DIAGNOSIS — E0841 Diabetes mellitus due to underlying condition with diabetic mononeuropathy: Secondary | ICD-10-CM

## 2015-02-26 DIAGNOSIS — Z5181 Encounter for therapeutic drug level monitoring: Secondary | ICD-10-CM

## 2015-02-26 DIAGNOSIS — I10 Essential (primary) hypertension: Secondary | ICD-10-CM

## 2015-02-26 DIAGNOSIS — I482 Chronic atrial fibrillation, unspecified: Secondary | ICD-10-CM

## 2015-02-26 DIAGNOSIS — N189 Chronic kidney disease, unspecified: Secondary | ICD-10-CM

## 2015-02-26 DIAGNOSIS — N184 Chronic kidney disease, stage 4 (severe): Secondary | ICD-10-CM

## 2015-02-26 MED ORDER — HYDROCODONE-ACETAMINOPHEN 5-325 MG PO TABS: 1.0000 | ORAL_TABLET | Freq: Four times a day (QID) | ORAL | Status: DC | PRN

## 2015-02-26 NOTE — Patient Instructions (Signed)
You  may move around, but avoid painful motions and activities.  Apply  Heat  to the sore area for 15 to 20 minutes 3 or 4 times daily for the next two to 3 days.  Report any new or worsening symptoms

## 2015-02-26 NOTE — Progress Notes (Signed)
Pre visit review using our clinic review tool, if applicable. No additional management support is needed unless otherwise documented below in the visit note. 

## 2015-02-26 NOTE — Progress Notes (Signed)
Subjective:    Patient ID: [name] [name]., male    DOB: 1931/06/11, 81 y.o.   MRN: [id number]  HPI 81 year old patient who is seen today with persistent left buttock and leg pain.  He was seen last week and given a Depo-Medrol shot with little benefit.  He states that he perhaps was better for half a day.  He was seen in the ED due to worsening pain and evaluation included a negative Doppler evaluation.  Radiographs revealed only minimal hip arthritis. 4 days prior to the onset of the pain.  He was moving heavy furniture.  Pain is aggravated by walking and movement and alleviated by resting.  Pain is primarily the left buttock area and left calf.  There has been no motor weakness.  Past Medical History  Diagnosis Date  . Chronic atrial fibrillation     a. Historically difficult rate control. b. s/p CRT-D implantation with anticipation of AV junction ablation but patient then was able to accomplish rate control and ablation not undertaken.; AVN ablation 08/2013 by Dr Lovena Le  . CARDIOMYOPATHY, PRIMARY, DILATED     a. Mixed ICM/NICM - EF 15% by echo 05/2009; St. Jude CRT-D implanted 06/2009. b. Echo 04/2013 - EF 20%, mild LVH.  [name] Chronic systolic CHF (congestive heart failure)   . CORONARY ARTERY DISEASE     a. BMS to LAD 04/2008. b. Cath 06/2009: nonobstructive disease.  [name] SLEEP APNEA   . Hypertension   . Hyperlipidemia   . Diabetes mellitus   . Osteoarthritis   . Tubular adenoma of colon   . Secondary hyperparathyroidism   . CKD (chronic kidney disease), stage IV     a. Right brachiocephalic AV fistula 10/7515. Neph = Dunham.  . Vertigo   . Valvular heart disease     a. Echo 04/2013: mild AI, mild MR.  . Pulmonary HTN     a. PA pressure 22mmHg by echo 05/01/13.  . Prostate cancer     a. s/p radical prostatectomy.  . Colon cancer   . Skin cancer   . Presence of permanent cardiac pacemaker     History   Social History  . Marital Status: Widowed    Spouse Name: N/A  . Number of  Children: 4  . Years of Education: N/A   Occupational History  . retired    Social History Main Topics  . Smoking status: Former Smoker    Types: Cigarettes    Quit date: 09/22/1966  . Smokeless tobacco: Never Used  . Alcohol Use: 0.0 - 0.6 oz/week    0-1 Standard drinks or equivalent per week     Comment: occasional  . Drug Use: No  . Sexual Activity: No   Other Topics Concern  . Not on file   Social History Narrative    Past Surgical History  Procedure Laterality Date  . Colectomy    . Prostatectomy    . Penile prosthesis placement    . Removed prosthetic eye    . Ptca      stent placed  . Total knee arthroplasty  2008    right  . Pacemaker insertion  2010  . Av fistula placement  03/30/2012    Procedure: ARTERIOVENOUS (AV) FISTULA CREATION;  Surgeon: Angelia Mould, MD;  Location: [place];  Service: Vascular;  Laterality: Right;  Creation of BrachioCephalic Fistula Right arm  . Eye surgery      left eye prothesis  . Fracture surgery  collar bone, right knee replacement  . Bi-ventricular implantable cardioverter defibrillator  (crt-d)  2010    STJ CRTD implanted by Dr [name]  . Ablation  08/2013    AVN ablation by Dr Lovena Le  . Av node ablation N/A 09/12/2013    Procedure: AV NODE ABLATION;  Surgeon: [name], MD;  Location: [facility] CATH LAB;  Service: Cardiovascular;  Laterality: N/A;  . Insert / replace / remove pacemaker    . Joint replacement      Family History  Problem Relation Age of Onset  . Heart disease Father   . Throat cancer Father   . Throat cancer Mother   . Heart disease Mother   . Hypertension Mother     Allergies  Allergen Reactions  . Ace Inhibitors     Stopped by Dr. Burt Knack due to progressive renal failure  . Prednisone Other (See Comments)    Raised blood sugar to almost 900    Current Outpatient Prescriptions on File Prior to Visit  Medication Sig Dispense Refill  . acetaminophen (TYLENOL) 500 MG tablet Take 500 mg by  mouth as needed for mild pain.     [name] aspirin 81 MG chewable tablet Chew 81 mg by mouth daily.    [name] DIGOX 125 MCG tablet Take 1 tablet by mouth  every other day 45 tablet 1  . furosemide (LASIX) 80 MG tablet Take 1 tablet by mouth two  times daily 180 tablet 1  . glucose blood (FREESTYLE LITE) test strip 1 each by Other route 2 (two) times daily as needed for other. Dx: 250.00 100 each 12  . hydrALAZINE (APRESOLINE) 25 MG tablet Take 1 and 1/2 tablets by  mouth 3 times daily 360 tablet 1  . HYDROcodone-acetaminophen (NORCO/VICODIN) 5-325 MG per tablet Take 1-2 tablets by mouth every 6 (six) hours as needed for moderate pain. 25 tablet 0  . insulin lispro protamine-lispro (HUMALOG 75/25 MIX) (75-25) 100 UNIT/ML SUSP injection Inject 26 Units into the skin 2 (two) times daily with a meal. 6 vial 1  . isosorbide mononitrate (IMDUR) 30 MG 24 hr tablet Take 0.5 tablets (15 mg total) by mouth daily. 90 tablet 1  . Lancets (FREESTYLE) lancets 1 each by Other route 2 (two) times daily as needed for other. 100 each 12  . metoprolol succinate (TOPROL-XL) 100 MG 24 hr tablet Take 1 tablet by mouth  twice a day with or  immediately following meal 180 tablet 1  . potassium chloride SA (K-DUR,KLOR-CON) 20 MEQ tablet Take 2 tablets (40 mEq total) by mouth daily. (Patient taking differently: Take 20 mEq by mouth daily. ) 180 tablet 3  . warfarin (COUMADIN) 5 MG tablet On Monday and Friday take 2.5 mg; On Tuesday, Wednesday, Thursday, Saturday, and Sunday takes 5 mg. Resume home dose on 3-18 90 tablet 1   No current facility-administered medications on file prior to visit.    BP 150/80 mmHg  Pulse 76  Temp(Src) 98.3 F (36.8 C) (Oral)  Resp 20  Ht 6' (1.829 m)  Wt 175 lb (79.379 kg)  BMI 23.73 kg/m2  SpO2 98%       Review of Systems  Constitutional: Negative for fever, chills, appetite change and fatigue.  HENT: Negative for congestion, dental problem, ear pain, hearing loss, sore throat, tinnitus,  trouble swallowing and voice change.   Eyes: Negative for pain, discharge and visual disturbance.  Respiratory: Negative for cough, chest tightness, wheezing and stridor.   Cardiovascular: Negative for chest  pain, palpitations and leg swelling.  Gastrointestinal: Negative for nausea, vomiting, abdominal pain, diarrhea, constipation, blood in stool and abdominal distention.  Genitourinary: Negative for urgency, hematuria, flank pain, discharge, difficulty urinating and genital sores.  Musculoskeletal: Positive for gait problem. Negative for myalgias, back pain, joint swelling, arthralgias and neck stiffness.  Skin: Negative for rash.  Neurological: Negative for dizziness, syncope, speech difficulty, weakness, numbness and headaches.  Hematological: Negative for adenopathy. Does not bruise/bleed easily.  Psychiatric/Behavioral: Negative for behavioral problems and dysphoric mood. The patient is not nervous/anxious.        Objective:   Physical Exam  Constitutional: He appears well-developed and well-nourished. No distress.  Musculoskeletal:  No tenderness over the left buttock area No swelling Full range of motion of the hips Able to walk on his toes and heels Left Achilles reflex is present          Assessment & Plan:   Left leg and buttock pain, unclear etiology.  Suspect more musculoligamentous.  Will continue analgesics and observe at this time.  May need to consider orthopedic referral if symptoms persist Cardio myopathy Chronic kidney disease  Patient will call if there is any new or worsening symptoms Otherwise, return as scheduled.

## 2015-02-28 ENCOUNTER — Encounter: Payer: Self-pay | Admitting: Podiatry

## 2015-02-28 ENCOUNTER — Ambulatory Visit (INDEPENDENT_AMBULATORY_CARE_PROVIDER_SITE_OTHER): Payer: Medicare Other | Admitting: Podiatry

## 2015-02-28 DIAGNOSIS — B351 Tinea unguium: Secondary | ICD-10-CM

## 2015-02-28 DIAGNOSIS — M79606 Pain in leg, unspecified: Secondary | ICD-10-CM | POA: Diagnosis not present

## 2015-02-28 NOTE — Patient Instructions (Signed)
Seen for hypertrophic nails. All nails debrided. Return in 3 months or as needed.  

## 2015-02-28 NOTE — Progress Notes (Signed)
Subjective:  81 y.o. year old male IDDM presents requesting toe nails trimmed today. No new problems other than lost a few pound in weight.   Objective:  Dermatologic:  Thick dystrophic nails x 10. Skin: No skin lesions.  Vascular:  Dorsalis pedis pulses palpable on both feet.  Posterior tibial pulses are not palpable on both feet.  No edema or erythema, no ischemic changes noted.  Orthopedic:  Cavus type foot without gross deformity.  Neurologic:  Failed respond to Monofilament sensory testing bilateral. Vibratory and Ankle DTR is within normal bilateral.   Assessment:  Dystrophic mycotic nails x 9.  Diabetic neuropathy.   Treatment: All mycotic nails and ingrown nails debrided.  Return in 3 months or as needed

## 2015-03-02 ENCOUNTER — Ambulatory Visit: Payer: Medicare Other | Admitting: Podiatry

## 2015-03-05 ENCOUNTER — Ambulatory Visit (INDEPENDENT_AMBULATORY_CARE_PROVIDER_SITE_OTHER): Payer: Medicare Other | Admitting: General Practice

## 2015-03-05 DIAGNOSIS — Z5181 Encounter for therapeutic drug level monitoring: Secondary | ICD-10-CM

## 2015-03-05 DIAGNOSIS — I482 Chronic atrial fibrillation, unspecified: Secondary | ICD-10-CM

## 2015-03-05 LAB — POCT INR: INR: 2.8

## 2015-03-05 NOTE — Progress Notes (Signed)
Pre visit review using our clinic review tool, if applicable. No additional management support is needed unless otherwise documented below in the visit note. 

## 2015-03-14 ENCOUNTER — Encounter: Payer: Self-pay | Admitting: Internal Medicine

## 2015-03-14 ENCOUNTER — Ambulatory Visit (INDEPENDENT_AMBULATORY_CARE_PROVIDER_SITE_OTHER): Payer: Medicare Other | Admitting: Internal Medicine

## 2015-03-14 VITALS — BP 150/82 | HR 77 | Temp 98.2°F | Resp 20 | Ht 72.0 in | Wt 175.0 lb

## 2015-03-14 DIAGNOSIS — M150 Primary generalized (osteo)arthritis: Secondary | ICD-10-CM | POA: Diagnosis not present

## 2015-03-14 DIAGNOSIS — I10 Essential (primary) hypertension: Secondary | ICD-10-CM

## 2015-03-14 DIAGNOSIS — I255 Ischemic cardiomyopathy: Secondary | ICD-10-CM | POA: Diagnosis not present

## 2015-03-14 DIAGNOSIS — M159 Polyosteoarthritis, unspecified: Secondary | ICD-10-CM

## 2015-03-14 MED ORDER — HYDROCODONE-ACETAMINOPHEN 5-325 MG PO TABS: 1.0000 | ORAL_TABLET | Freq: Four times a day (QID) | ORAL | Status: DC | PRN

## 2015-03-14 NOTE — Progress Notes (Signed)
Subjective:    Patient ID: [name] Baswell., male    DOB: [birth year], 81 y.o.   MRN: [id number]  HPI  81 year old patient who has osteoarthritis, chronic atrial fibrillation and hypertension.  He has been seen recently with left hip and buttock pain that has nicely improved.  He will be driving to [place] soon and was concerned about his ability to drive.  He has made this trip on multiple prior occasions. He remains on chronic Coumadin anticoagulation.  Past Medical History  Diagnosis Date  . Chronic atrial fibrillation     a. Historically difficult rate control. b. s/p CRT-D implantation with anticipation of AV junction ablation but patient then was able to accomplish rate control and ablation not undertaken.; AVN ablation 08/2013 by Dr Lovena Le  . CARDIOMYOPATHY, PRIMARY, DILATED     a. Mixed ICM/NICM - EF 15% by echo 05/2009; St. Jude CRT-D implanted 06/2009. b. Echo 04/2013 - EF 20%, mild LVH.  [name] Chronic systolic CHF (congestive heart failure)   . CORONARY ARTERY DISEASE     a. BMS to LAD 04/2008. b. Cath 06/2009: nonobstructive disease.  [name] SLEEP APNEA   . Hypertension   . Hyperlipidemia   . Diabetes mellitus   . Osteoarthritis   . Tubular adenoma of colon   . Secondary hyperparathyroidism   . CKD (chronic kidney disease), stage IV     a. Right brachiocephalic AV fistula 03/1244. Neph = Dunham.  . Vertigo   . Valvular heart disease     a. Echo 04/2013: mild AI, mild MR.  . Pulmonary HTN     a. PA pressure 30mmHg by echo 05/01/13.  . Prostate cancer     a. s/p radical prostatectomy.  . Colon cancer   . Skin cancer   . Presence of permanent cardiac pacemaker     History   Social History  . Marital Status: Widowed    Spouse Name: N/A  . Number of Children: 4  . Years of Education: N/A   Occupational History  . retired    Social History Main Topics  . Smoking status: Former Smoker    Types: Cigarettes    Quit date: 09/22/1966  . Smokeless tobacco: Never Used  .  Alcohol Use: 0.0 - 0.6 oz/week    0-1 Standard drinks or equivalent per week     Comment: occasional  . Drug Use: No  . Sexual Activity: No   Other Topics Concern  . Not on file   Social History Narrative    Past Surgical History  Procedure Laterality Date  . Colectomy    . Prostatectomy    . Penile prosthesis placement    . Removed prosthetic eye    . Ptca      stent placed  . Total knee arthroplasty  2008    right  . Pacemaker insertion  2010  . Av fistula placement  03/30/2012    Procedure: ARTERIOVENOUS (AV) FISTULA CREATION;  Surgeon: Angelia Mould, MD;  Location: [facility];  Service: Vascular;  Laterality: Right;  Creation of BrachioCephalic Fistula Right arm  . Eye surgery      left eye prothesis  . Fracture surgery      collar bone, right knee replacement  . Bi-ventricular implantable cardioverter defibrillator  (crt-d)  2010    STJ CRTD implanted by Dr [name]  . Ablation  08/2013    AVN ablation by Dr Lovena Le  . Av node ablation N/A 09/12/2013    Procedure:  AV NODE ABLATION;  Surgeon: [name], MD;  Location: [facility] CATH LAB;  Service: Cardiovascular;  Laterality: N/A;  . Insert / replace / remove pacemaker    . Joint replacement      Family History  Problem Relation Age of Onset  . Heart disease Father   . Throat cancer Father   . Throat cancer Mother   . Heart disease Mother   . Hypertension Mother     Allergies  Allergen Reactions  . Ace Inhibitors     Stopped by Dr. Burt Knack due to progressive renal failure  . Prednisone Other (See Comments)    Raised blood sugar to almost 900    Current Outpatient Prescriptions on File Prior to Visit  Medication Sig Dispense Refill  . acetaminophen (TYLENOL) 500 MG tablet Take 500 mg by mouth as needed for mild pain.     [name] aspirin 81 MG chewable tablet Chew 81 mg by mouth daily.    . calcitRIOL (ROCALTROL) 0.25 MCG capsule Take 0.25 mcg by mouth. Monday, Wed and Friday    . DIGOX 125 MCG tablet Take 1 tablet  by mouth  every other day 45 tablet 1  . furosemide (LASIX) 80 MG tablet Take 1 tablet by mouth two  times daily 180 tablet 1  . glucose blood (FREESTYLE LITE) test strip 1 each by Other route 2 (two) times daily as needed for other. Dx: 250.00 100 each 12  . hydrALAZINE (APRESOLINE) 25 MG tablet Take 1 and 1/2 tablets by  mouth 3 times daily 360 tablet 1  . HYDROcodone-acetaminophen (NORCO/VICODIN) 5-325 MG per tablet Take 1-2 tablets by mouth every 6 (six) hours as needed for moderate pain. 60 tablet 0  . insulin lispro protamine-lispro (HUMALOG 75/25 MIX) (75-25) 100 UNIT/ML SUSP injection Inject 26 Units into the skin 2 (two) times daily with a meal. 6 vial 1  . isosorbide mononitrate (IMDUR) 30 MG 24 hr tablet Take 0.5 tablets (15 mg total) by mouth daily. 90 tablet 1  . Lancets (FREESTYLE) lancets 1 each by Other route 2 (two) times daily as needed for other. 100 each 12  . metoprolol succinate (TOPROL-XL) 100 MG 24 hr tablet Take 1 tablet by mouth  twice a day with or  immediately following meal 180 tablet 1  . potassium chloride SA (K-DUR,KLOR-CON) 20 MEQ tablet Take 2 tablets (40 mEq total) by mouth daily. (Patient taking differently: Take 20 mEq by mouth daily. ) 180 tablet 3  . warfarin (COUMADIN) 5 MG tablet On Monday and Friday take 2.5 mg; On Tuesday, Wednesday, Thursday, Saturday, and Sunday takes 5 mg. Resume home dose on 3-18 90 tablet 1   No current facility-administered medications on file prior to visit.    BP 150/82 mmHg  Pulse 77  Temp(Src) 98.2 F (36.8 C) (Oral)  Resp 20  Ht 6' (1.829 m)  Wt 175 lb (79.379 kg)  BMI 23.73 kg/m2  SpO2 98%     Review of Systems  Constitutional: Negative for fever, chills, appetite change and fatigue.  HENT: Negative for congestion, dental problem, ear pain, hearing loss, sore throat, tinnitus, trouble swallowing and voice change.   Eyes: Negative for pain, discharge and visual disturbance.  Respiratory: Negative for cough, chest  tightness, wheezing and stridor.   Cardiovascular: Negative for chest pain, palpitations and leg swelling.  Gastrointestinal: Negative for nausea, vomiting, abdominal pain, diarrhea, constipation, blood in stool and abdominal distention.  Genitourinary: Negative for urgency, hematuria, flank pain, discharge, difficulty urinating  and genital sores.  Musculoskeletal: Positive for back pain, arthralgias and gait problem. Negative for myalgias, joint swelling and neck stiffness.  Skin: Negative for rash.  Neurological: Negative for dizziness, syncope, speech difficulty, weakness, numbness and headaches.  Hematological: Negative for adenopathy. Does not bruise/bleed easily.  Psychiatric/Behavioral: Negative for behavioral problems and dysphoric mood. The patient is not nervous/anxious.        Objective:   Physical Exam  Constitutional: He appears well-developed and well-nourished. No distress.  Repeat blood pressure 124/72  Cardiovascular:  Controlled ventricular response  Pulmonary/Chest: Effort normal and breath sounds normal. No respiratory distress. He has no wheezes. He has no rales.  Musculoskeletal: He exhibits no edema.          Assessment & Plan:   Left hip pain improved Hypertension, stable Chronic atrial fibrillation.  Continue Coumadin anticoagulation  Recheck 4 months

## 2015-03-14 NOTE — Progress Notes (Signed)
Pre visit review using our clinic review tool, if applicable. No additional management support is needed unless otherwise documented below in the visit note. 

## 2015-03-14 NOTE — Patient Instructions (Signed)
Call or return to clinic prn if these symptoms worsen or fail to improve as anticipated.

## 2015-03-15 ENCOUNTER — Ambulatory Visit (INDEPENDENT_AMBULATORY_CARE_PROVIDER_SITE_OTHER): Payer: Medicare Other | Admitting: Cardiovascular Disease

## 2015-03-15 ENCOUNTER — Encounter: Payer: Self-pay | Admitting: Cardiovascular Disease

## 2015-03-15 ENCOUNTER — Ambulatory Visit (INDEPENDENT_AMBULATORY_CARE_PROVIDER_SITE_OTHER): Payer: Medicare Other | Admitting: *Deleted

## 2015-03-15 ENCOUNTER — Encounter: Payer: Self-pay | Admitting: Internal Medicine

## 2015-03-15 ENCOUNTER — Other Ambulatory Visit: Payer: Self-pay | Admitting: Internal Medicine

## 2015-03-15 VITALS — BP 158/82 | HR 75 | Ht 72.0 in | Wt 177.4 lb

## 2015-03-15 DIAGNOSIS — I255 Ischemic cardiomyopathy: Secondary | ICD-10-CM | POA: Diagnosis not present

## 2015-03-15 DIAGNOSIS — I4891 Unspecified atrial fibrillation: Secondary | ICD-10-CM | POA: Diagnosis not present

## 2015-03-15 DIAGNOSIS — I482 Chronic atrial fibrillation, unspecified: Secondary | ICD-10-CM

## 2015-03-15 DIAGNOSIS — I10 Essential (primary) hypertension: Secondary | ICD-10-CM | POA: Diagnosis not present

## 2015-03-15 DIAGNOSIS — I5022 Chronic systolic (congestive) heart failure: Secondary | ICD-10-CM | POA: Diagnosis not present

## 2015-03-15 LAB — CUP PACEART REMOTE DEVICE CHECK
Battery Remaining Percentage: 34 %
Battery Voltage: 2.86 V
HighPow Impedance: 40 Ohm
Lead Channel Impedance Value: 380 Ohm
Lead Channel Pacing Threshold Amplitude: 1 V
Lead Channel Pacing Threshold Amplitude: 1.75 V
Lead Channel Pacing Threshold Pulse Width: 1 ms
Lead Channel Sensing Intrinsic Amplitude: 11.8 mV
Lead Channel Setting Sensing Sensitivity: 0.5 mV
MDC IDC MSMT BATTERY REMAINING LONGEVITY: 19 mo
MDC IDC MSMT LEADCHNL LV IMPEDANCE VALUE: 430 Ohm
MDC IDC MSMT LEADCHNL RV PACING THRESHOLD PULSEWIDTH: 0.6 ms
MDC IDC SESS DTM: 20160623075223
MDC IDC SET LEADCHNL LV PACING AMPLITUDE: 3 V
MDC IDC SET LEADCHNL LV PACING PULSEWIDTH: 1 ms
MDC IDC SET LEADCHNL RV PACING AMPLITUDE: 2.5 V
MDC IDC SET LEADCHNL RV PACING PULSEWIDTH: 0.6 ms
MDC IDC SET ZONE DETECTION INTERVAL: 250 ms
Pulse Gen Serial Number: 595859
Zone Setting Detection Interval: 300 ms

## 2015-03-15 NOTE — Patient Instructions (Signed)

## 2015-03-15 NOTE — Progress Notes (Signed)
Remote ICD transmission.   

## 2015-03-15 NOTE — Progress Notes (Signed)
Cardiology Office Note   Date:  03/15/2015   ID:  [name] [name]., DOB [birth year], MRN [id number]  PCP:  Nyoka Cowden, MD  Cardiologist:  Sherren Mocha, MD    No chief complaint on file.    History of Present Illness: [name] [name]. is a 81 y.o. male who presents for follow-up evaluation.  He's followed for chronic systolic heart failure, atrial fibrillation, Stage 4 CKD, and CAD (LAD stent 2009). His cardiomyopathy has been considered out-of-proportion to the extent of his CAD (NICM). He underwent AVN ablation December 2014. He has undergone CRT-D.   He is doing ok. His wife and sister both passed away in the past year so things have been difficult for him. Son lives in [place] and checks on him frequently via telephone. He complains of generalized fatigue but feels symptoms are stable. No CP, palpitations, orthopnea, or PND.   Past Medical History  Diagnosis Date  . Chronic atrial fibrillation     a. Historically difficult rate control. b. s/p CRT-D implantation with anticipation of AV junction ablation but patient then was able to accomplish rate control and ablation not undertaken.; AVN ablation 08/2013 by Dr Lovena Le  . CARDIOMYOPATHY, PRIMARY, DILATED     a. Mixed ICM/NICM - EF 15% by echo 05/2009; St. Jude CRT-D implanted 06/2009. b. Echo 04/2013 - EF 20%, mild LVH.  [name] Chronic systolic CHF (congestive heart failure)   . CORONARY ARTERY DISEASE     a. BMS to LAD 04/2008. b. Cath 06/2009: nonobstructive disease.  [name] SLEEP APNEA   . Hypertension   . Hyperlipidemia   . Diabetes mellitus   . Osteoarthritis   . Tubular adenoma of colon   . Secondary hyperparathyroidism   . CKD (chronic kidney disease), stage IV     a. Right brachiocephalic AV fistula 04/4165. Neph = Dunham.  . Vertigo   . Valvular heart disease     a. Echo 04/2013: mild AI, mild MR.  . Pulmonary HTN     a. PA pressure 83mmHg by echo 05/01/13.  . Prostate cancer     a. s/p radical  prostatectomy.  . Colon cancer   . Skin cancer   . Presence of permanent cardiac pacemaker     Past Surgical History  Procedure Laterality Date  . Colectomy    . Prostatectomy    . Penile prosthesis placement    . Removed prosthetic eye    . Ptca      stent placed  . Total knee arthroplasty  2008    right  . Pacemaker insertion  2010  . Av fistula placement  03/30/2012    Procedure: ARTERIOVENOUS (AV) FISTULA CREATION;  Surgeon: Angelia Mould, MD;  Location: [facility];  Service: Vascular;  Laterality: Right;  Creation of BrachioCephalic Fistula Right arm  . Eye surgery      left eye prothesis  . Fracture surgery      collar bone, right knee replacement  . Bi-ventricular implantable cardioverter defibrillator  (crt-d)  2010    STJ CRTD implanted by Dr [name]  . Ablation  08/2013    AVN ablation by Dr Lovena Le  . Av node ablation N/A 09/12/2013    Procedure: AV NODE ABLATION;  Surgeon: [name], MD;  Location[facility] CATH LAB;  Service: Cardiovascular;  Laterality: N/A;  . Insert / replace / remove pacemaker    . Joint replacement      Current Outpatient Prescriptions  Medication Sig Dispense  Refill  . acetaminophen (TYLENOL) 500 MG tablet Take 500 mg by mouth as needed for mild pain.     [name] aspirin 81 MG chewable tablet Chew 81 mg by mouth daily.    . calcitRIOL (ROCALTROL) 0.25 MCG capsule Take 0.25 mcg by mouth. Monday, Wed and Friday    . Calcium Carbonate Antacid (TUMS SMOOTHIES PO) Take 1 each by mouth 3 (three) times daily after meals.    [name] DIGOX 125 MCG tablet Take 1 tablet by mouth  every other day 45 tablet 1  . furosemide (LASIX) 80 MG tablet Take 1 tablet by mouth two  times daily 180 tablet 1  . glucose blood (FREESTYLE LITE) test strip 1 each by Other route 2 (two) times daily as needed for other. Dx: 250.00 100 each 12  . hydrALAZINE (APRESOLINE) 25 MG tablet Take 1 and 1/2 tablets by  mouth 3 times daily 360 tablet 1  . HYDROcodone-acetaminophen  (NORCO/VICODIN) 5-325 MG per tablet Take 1-2 tablets by mouth every 6 (six) hours as needed for moderate pain. 60 tablet 0  . insulin lispro protamine-lispro (HUMALOG 75/25 MIX) (75-25) 100 UNIT/ML SUSP injection Inject 26 Units into the skin 2 (two) times daily with a meal. 6 vial 1  . isosorbide mononitrate (IMDUR) 30 MG 24 hr tablet Take 0.5 tablets (15 mg total) by mouth daily. 90 tablet 1  . Lancets (FREESTYLE) lancets 1 each by Other route 2 (two) times daily as needed for other. 100 each 12  . metoprolol succinate (TOPROL-XL) 100 MG 24 hr tablet Take 1 tablet by mouth  twice a day with or  immediately following meal 180 tablet 1  . potassium chloride SA (K-DUR,KLOR-CON) 20 MEQ tablet Take 2 tablets (40 mEq total) by mouth daily. (Patient taking differently: Take 20 mEq by mouth daily. ) 180 tablet 3  . warfarin (COUMADIN) 5 MG tablet On Monday and Friday take 2.5 mg; On Tuesday, Wednesday, Thursday, Saturday, and Sunday takes 5 mg. Resume home dose on 3-18 90 tablet 1   No current facility-administered medications for this visit.    Allergies:   Ace inhibitors and Prednisone   Social History:  The patient  reports that he quit smoking about 48 years ago. His smoking use included Cigarettes. He has never used smokeless tobacco. He reports that he drinks alcohol. He reports that he does not use illicit drugs.   Family History:  The patient's  family history includes Heart disease in his father and mother; Hypertension in his mother; Throat cancer in his father and mother.    ROS:  Please see the history of present illness.  Otherwise, review of systems is positive for fatigue, weakness, DOE.  All other systems are reviewed and negative.    PHYSICAL EXAM: VS:  BP 158/82 mmHg  Pulse 75  Ht 6' (1.829 m)  Wt 177 lb 6.4 oz (80.468 kg)  BMI 24.05 kg/m2 , BMI Body mass index is 24.05 kg/(m^2). GEN: Well nourished, pleasant elderly male, in no acute distress HEENT: normal Neck: no JVD, no  masses. No carotid bruits Cardiac: RRR without murmur or gallop                Respiratory:  clear to auscultation bilaterally, normal work of breathing GI: soft, nontender, nondistended, + BS MS: no deformity or atrophy Ext: no pretibial edema, pedal pulses 2+= bilaterally Skin: warm and dry, no rash Neuro:  Strength and sensation are intact Psych: euthymic mood, full affect  EKG:  EKG is ordered today. The ekg ordered today shows Electronic Ventricular Pacemaker 75 bpm  Recent Labs: 05/07/2014: Pro B Natriuretic peptide (BNP) 12146.0* 12/05/2014: B Natriuretic Peptide 1144.6*; Magnesium 2.2 02/23/2015: BUN 72*; Creatinine, Ser 3.98*; Hemoglobin 13.0; Platelets 133*; Potassium 3.6; Sodium 143   Lipid Panel     Component Value Date/Time   CHOL 165 02/16/2014 0829   TRIG 128.0 02/16/2014 0829   TRIG 136 08/17/2006 0807   HDL 31.40* 02/16/2014 0829   CHOLHDL 5 02/16/2014 0829   CHOLHDL 3.6 CALC 08/17/2006 0807   VLDL 25.6 02/16/2014 0829   LDLCALC 108* 02/16/2014 0829   LDLDIRECT 102.7 08/03/2012 0929   LDLDIRECT 123.1 07/06/2006 0837      Wt Readings from Last 3 Encounters:  03/15/15 177 lb 6.4 oz (80.468 kg)  03/14/15 175 lb (79.379 kg)  02/26/15 175 lb (79.379 kg)     Cardiac Studies Reviewed: 2D Echo 12/06/2014: Study Conclusions  - Left ventricle: The cavity size was mildly dilated. Wall thickness was increased in a pattern of mild LVH. Systolic function was severely reduced. The estimated ejection fraction was in the range of 25% to 30%. Diffuse hypokinesis. - Aortic valve: Sclerosis without stenosis. There was mild regurgitation. - Mitral valve: There was mild regurgitation. - Left atrium: The atrium was mildly dilated. - Right atrium: The atrium was mildly dilated. - Pulmonary arteries: PA peak pressure: 48 mm Hg (S).  ASSESSMENT AND PLAN: 1.  Chronic AFib: stable with current management s/p AVN ablation. Tolerating anticoagulation with warfarin.    2. Chronic systolic HF, NYHA 2: some of his sx's are clearly due to general ageing, but suspect a component of CHF as well. Volume status looks good on exam. Continue same medial therapies.   3. CAD, native vessel, without angina: Continue current Rx's.  4. CKD 4: recent note from Dr Lorrene Reid reviewed.   5. HTN, essential: home BP's in better range and recent nephrology visit with BP 130/80. No changes made today.   Current medicines are reviewed with the patient today.  The patient does not have concerns regarding medicines.  Labs/ tests ordered today include:  No orders of the defined types were placed in this encounter.   Disposition:   FU one year  Signed, Sherren Mocha, MD  03/15/2015 9:46 AM    [facility] [address], [place], [place]  [address] Phone: [phone]; Fax: [phone]

## 2015-03-19 ENCOUNTER — Encounter: Payer: Self-pay | Admitting: Cardiovascular Disease

## 2015-03-28 ENCOUNTER — Telehealth: Payer: Self-pay | Admitting: Internal Medicine

## 2015-03-28 NOTE — Telephone Encounter (Signed)
Ebony from [facility] called to say they received the rx back for the cpap machine but they are needing face to face notes   phone number  [phone]  [phone]  Fax number

## 2015-04-02 ENCOUNTER — Other Ambulatory Visit: Payer: Self-pay | Admitting: Dermatology

## 2015-04-02 NOTE — Telephone Encounter (Signed)
Dr.K, please do an addendum to office visit 6/6 for CPAP.

## 2015-04-03 NOTE — Telephone Encounter (Signed)
Dr.K did addendum to office visit. Notes faxed to [facility]ention [facility].

## 2015-04-03 NOTE — Progress Notes (Signed)
   Subjective:    Patient ID: [name] [name]., male    DOB: May 25, 1931, 81 y.o.   MRN: [id number]  HPI  Patient has a long history of obstructive sleep apnea and requires  new equipment.  The patient has benefited from CPAP.  Will order new CPAP equipment with supplies and all accessories  Review of Systems     Objective:   Physical Exam        Assessment & Plan:

## 2015-04-09 ENCOUNTER — Other Ambulatory Visit: Payer: Self-pay | Admitting: Internal Medicine

## 2015-04-13 ENCOUNTER — Telehealth: Payer: Self-pay | Admitting: Cardiovascular Disease

## 2015-04-13 NOTE — Telephone Encounter (Signed)
Remote transmission received. No alerts noted. The patient's corvue reading's are showing just some slight increase over the last week. Reviewed the patient's corvue trends and symptoms with Dr. Burt Knack. Per Dr. Burt Knack, the patient's symptoms are atypical at this time- continue present medications. I have advised the patient of the above. I have encouraged him that if symptoms become recurrent/ more frequent, he should report to the ER for further evaluation. He voices understanding.

## 2015-04-13 NOTE — Telephone Encounter (Signed)
I spoke with the patient.  He reports that he was awoken from his sleep early this morning (around 2am) with sharp chest pain. He felt like he had 3-4 "shots" of sharp pain, then this resolved after he laid very still. He got up and drank some water and felt ok, but did not rest well. This morning, he is not having chest discomfort, but is complaining of some difficulty breathing. He does have CHF and has a St. Jude CRT-D implanted.  No transmissions have been received this morning with any alerts.  The patient does not have a working scale at home. He has not checked his BP/ HR as he does not have a working cuff to do this with.  I have asked him to send in a remote transmission this morning to look at his corvue readings. He is agreeable. I advised that once this information is received, then I will review with Dr. Burt Knack and call him back with recommendations. He has not had any medications this morning.

## 2015-04-13 NOTE — Telephone Encounter (Signed)
Pt c/o of Chest Pain: STAT if CP now or developed within 24 hours  1. Are you having CP right now? no  2. Are you experiencing any other symptoms (ex. SOB, nausea, vomiting, sweating)? Trouble breathing, weak   3. How long have you been experiencing CP? Started last night- "woke me up" very sharp, stand up weakens pt bring about Sob   4. Is your CP continuous or coming and going? Comes and goes, "debilitating"   5. Have you taken Nitroglycerin? no ?

## 2015-04-16 ENCOUNTER — Ambulatory Visit (INDEPENDENT_AMBULATORY_CARE_PROVIDER_SITE_OTHER): Payer: Medicare Other | Admitting: General Practice

## 2015-04-16 DIAGNOSIS — Z5181 Encounter for therapeutic drug level monitoring: Secondary | ICD-10-CM | POA: Diagnosis not present

## 2015-04-16 DIAGNOSIS — I482 Chronic atrial fibrillation, unspecified: Secondary | ICD-10-CM

## 2015-04-16 LAB — POCT INR: INR: 1.8

## 2015-04-16 NOTE — Progress Notes (Signed)
Pre visit review using our clinic review tool, if applicable. No additional management support is needed unless otherwise documented below in the visit note. 

## 2015-04-27 ENCOUNTER — Encounter: Payer: Self-pay | Admitting: Cardiology

## 2015-05-03 ENCOUNTER — Other Ambulatory Visit: Payer: Self-pay | Admitting: Internal Medicine

## 2015-05-07 ENCOUNTER — Encounter (HOSPITAL_BASED_OUTPATIENT_CLINIC_OR_DEPARTMENT_OTHER): Payer: Self-pay

## 2015-05-07 ENCOUNTER — Emergency Department (HOSPITAL_BASED_OUTPATIENT_CLINIC_OR_DEPARTMENT_OTHER): Payer: Medicare Other

## 2015-05-07 ENCOUNTER — Telehealth: Payer: Self-pay | Admitting: Internal Medicine

## 2015-05-07 ENCOUNTER — Inpatient Hospital Stay (HOSPITAL_BASED_OUTPATIENT_CLINIC_OR_DEPARTMENT_OTHER)
Admission: EM | Admit: 2015-05-07 | Discharge: 2015-05-10 | DRG: 287 | Disposition: A | Discharge status: Home / Self Care | Payer: Medicare Other | Attending: Family Medicine | Admitting: Family Medicine

## 2015-05-07 DIAGNOSIS — Z794 Long term (current) use of insulin: Secondary | ICD-10-CM | POA: Diagnosis not present

## 2015-05-07 DIAGNOSIS — Z87891 Personal history of nicotine dependence: Secondary | ICD-10-CM

## 2015-05-07 DIAGNOSIS — Z888 Allergy status to other drugs, medicaments and biological substances status: Secondary | ICD-10-CM | POA: Diagnosis not present

## 2015-05-07 DIAGNOSIS — Z85038 Personal history of other malignant neoplasm of large intestine: Secondary | ICD-10-CM

## 2015-05-07 DIAGNOSIS — I482 Chronic atrial fibrillation, unspecified: Secondary | ICD-10-CM | POA: Diagnosis present

## 2015-05-07 DIAGNOSIS — G4733 Obstructive sleep apnea (adult) (pediatric): Secondary | ICD-10-CM | POA: Diagnosis present

## 2015-05-07 DIAGNOSIS — R778 Other specified abnormalities of plasma proteins: Secondary | ICD-10-CM | POA: Diagnosis present

## 2015-05-07 DIAGNOSIS — E1129 Type 2 diabetes mellitus with other diabetic kidney complication: Secondary | ICD-10-CM | POA: Diagnosis present

## 2015-05-07 DIAGNOSIS — I200 Unstable angina: Secondary | ICD-10-CM

## 2015-05-07 DIAGNOSIS — I2510 Atherosclerotic heart disease of native coronary artery without angina pectoris: Secondary | ICD-10-CM | POA: Diagnosis present

## 2015-05-07 DIAGNOSIS — Z4502 Encounter for adjustment and management of automatic implantable cardiac defibrillator: Secondary | ICD-10-CM | POA: Diagnosis not present

## 2015-05-07 DIAGNOSIS — D696 Thrombocytopenia, unspecified: Secondary | ICD-10-CM | POA: Diagnosis present

## 2015-05-07 DIAGNOSIS — I429 Cardiomyopathy, unspecified: Secondary | ICD-10-CM | POA: Diagnosis present

## 2015-05-07 DIAGNOSIS — I272 Other secondary pulmonary hypertension: Secondary | ICD-10-CM | POA: Diagnosis present

## 2015-05-07 DIAGNOSIS — D899 Disorder involving the immune mechanism, unspecified: Secondary | ICD-10-CM | POA: Diagnosis present

## 2015-05-07 DIAGNOSIS — N179 Acute kidney failure, unspecified: Secondary | ICD-10-CM | POA: Diagnosis present

## 2015-05-07 DIAGNOSIS — I10 Essential (primary) hypertension: Secondary | ICD-10-CM | POA: Diagnosis present

## 2015-05-07 DIAGNOSIS — Z791 Long term (current) use of non-steroidal anti-inflammatories (NSAID): Secondary | ICD-10-CM

## 2015-05-07 DIAGNOSIS — N184 Chronic kidney disease, stage 4 (severe): Secondary | ICD-10-CM

## 2015-05-07 DIAGNOSIS — Z95810 Presence of automatic (implantable) cardiac defibrillator: Secondary | ICD-10-CM | POA: Diagnosis not present

## 2015-05-07 DIAGNOSIS — N185 Chronic kidney disease, stage 5: Secondary | ICD-10-CM | POA: Diagnosis present

## 2015-05-07 DIAGNOSIS — Z7982 Long term (current) use of aspirin: Secondary | ICD-10-CM | POA: Diagnosis not present

## 2015-05-07 DIAGNOSIS — I5023 Acute on chronic systolic (congestive) heart failure: Secondary | ICD-10-CM | POA: Diagnosis present

## 2015-05-07 DIAGNOSIS — Z79899 Other long term (current) drug therapy: Secondary | ICD-10-CM | POA: Diagnosis not present

## 2015-05-07 DIAGNOSIS — Z955 Presence of coronary angioplasty implant and graft: Secondary | ICD-10-CM

## 2015-05-07 DIAGNOSIS — E785 Hyperlipidemia, unspecified: Secondary | ICD-10-CM | POA: Diagnosis present

## 2015-05-07 DIAGNOSIS — N2581 Secondary hyperparathyroidism of renal origin: Secondary | ICD-10-CM | POA: Diagnosis present

## 2015-05-07 DIAGNOSIS — R0600 Dyspnea, unspecified: Secondary | ICD-10-CM | POA: Diagnosis present

## 2015-05-07 DIAGNOSIS — Z8249 Family history of ischemic heart disease and other diseases of the circulatory system: Secondary | ICD-10-CM | POA: Diagnosis not present

## 2015-05-07 DIAGNOSIS — E1122 Type 2 diabetes mellitus with diabetic chronic kidney disease: Secondary | ICD-10-CM | POA: Diagnosis present

## 2015-05-07 DIAGNOSIS — Z96651 Presence of right artificial knee joint: Secondary | ICD-10-CM | POA: Diagnosis present

## 2015-05-07 DIAGNOSIS — Z79891 Long term (current) use of opiate analgesic: Secondary | ICD-10-CM

## 2015-05-07 DIAGNOSIS — Z8546 Personal history of malignant neoplasm of prostate: Secondary | ICD-10-CM

## 2015-05-07 DIAGNOSIS — R7989 Other specified abnormal findings of blood chemistry: Secondary | ICD-10-CM

## 2015-05-07 DIAGNOSIS — G4730 Sleep apnea, unspecified: Secondary | ICD-10-CM | POA: Diagnosis present

## 2015-05-07 DIAGNOSIS — Z66 Do not resuscitate: Secondary | ICD-10-CM | POA: Diagnosis present

## 2015-05-07 DIAGNOSIS — E1165 Type 2 diabetes mellitus with hyperglycemia: Secondary | ICD-10-CM | POA: Diagnosis present

## 2015-05-07 DIAGNOSIS — Z7901 Long term (current) use of anticoagulants: Secondary | ICD-10-CM

## 2015-05-07 DIAGNOSIS — I120 Hypertensive chronic kidney disease with stage 5 chronic kidney disease or end stage renal disease: Secondary | ICD-10-CM | POA: Diagnosis present

## 2015-05-07 LAB — CBC WITH DIFFERENTIAL/PLATELET
BASOS PCT: 0 % (ref 0–1)
Basophils Absolute: 0 10*3/uL (ref 0.0–0.1)
Eosinophils Absolute: 0.1 10*3/uL (ref 0.0–0.7)
Eosinophils Relative: 2 % (ref 0–5)
HEMATOCRIT: 38.1 % — AB (ref 39.0–52.0)
Hemoglobin: 12.6 g/dL — ABNORMAL LOW (ref 13.0–17.0)
Lymphocytes Relative: 18 % (ref 12–46)
Lymphs Abs: 1.4 10*3/uL (ref 0.7–4.0)
MCH: 32.1 pg (ref 26.0–34.0)
MCHC: 33.1 g/dL (ref 30.0–36.0)
MCV: 97.2 fL (ref 78.0–100.0)
MONO ABS: 0.5 10*3/uL (ref 0.1–1.0)
MONOS PCT: 6 % (ref 3–12)
NEUTROS ABS: 5.7 10*3/uL (ref 1.7–7.7)
Neutrophils Relative %: 74 % (ref 43–77)
Platelets: 132 10*3/uL — ABNORMAL LOW (ref 150–400)
RBC: 3.92 MIL/uL — ABNORMAL LOW (ref 4.22–5.81)
RDW: 14.3 % (ref 11.5–15.5)
WBC: 7.7 10*3/uL (ref 4.0–10.5)

## 2015-05-07 LAB — COMPREHENSIVE METABOLIC PANEL
ALT: 15 U/L — ABNORMAL LOW (ref 17–63)
AST: 22 U/L (ref 15–41)
Albumin: 4 g/dL (ref 3.5–5.0)
Alkaline Phosphatase: 27 U/L — ABNORMAL LOW (ref 38–126)
Anion gap: 15 (ref 5–15)
BILIRUBIN TOTAL: 1.1 mg/dL (ref 0.3–1.2)
BUN: 72 mg/dL — ABNORMAL HIGH (ref 6–20)
CO2: 21 mmol/L — ABNORMAL LOW (ref 22–32)
Calcium: 9.7 mg/dL (ref 8.9–10.3)
Chloride: 106 mmol/L (ref 101–111)
Creatinine, Ser: 4.65 mg/dL — ABNORMAL HIGH (ref 0.61–1.24)
GFR calc Af Amer: 12 mL/min — ABNORMAL LOW (ref >60)
GFR, EST NON AFRICAN AMERICAN: 11 mL/min — AB (ref >60)
Glucose, Bld: 245 mg/dL — ABNORMAL HIGH (ref 65–99)
Potassium: 4.1 mmol/L (ref 3.5–5.1)
Sodium: 142 mmol/L (ref 135–145)
TOTAL PROTEIN: 6.8 g/dL (ref 6.5–8.1)

## 2015-05-07 LAB — DIGOXIN LEVEL

## 2015-05-07 LAB — CBG MONITORING, ED: Glucose-Capillary: 233 mg/dL — ABNORMAL HIGH (ref 65–99)

## 2015-05-07 LAB — PROTIME-INR
INR: 3.08 — AB (ref 0.00–1.49)
Prothrombin Time: 31.2 seconds — ABNORMAL HIGH (ref 11.6–15.2)

## 2015-05-07 LAB — TROPONIN I
TROPONIN I: 0.09 ng/mL — AB (ref <0.031)
Troponin I: 0.06 ng/mL — ABNORMAL HIGH (ref <0.031)

## 2015-05-07 LAB — GLUCOSE, CAPILLARY: GLUCOSE-CAPILLARY: 307 mg/dL — AB (ref 65–99)

## 2015-05-07 LAB — BRAIN NATRIURETIC PEPTIDE: B Natriuretic Peptide: 1284.9 pg/mL — ABNORMAL HIGH (ref 0.0–100.0)

## 2015-05-07 MED ORDER — ASPIRIN 81 MG PO CHEW
81.0000 mg | CHEWABLE_TABLET | Freq: Every day | ORAL | Status: DC
  Administered 2015-05-07 – 2015-05-10 (×3): 81 mg via ORAL
  Filled 2015-05-07 (×4): qty 1

## 2015-05-07 MED ORDER — METOPROLOL TARTRATE 50 MG PO TABS
ORAL_TABLET | ORAL | Status: AC
  Filled 2015-05-07: qty 1

## 2015-05-07 MED ORDER — METOPROLOL TARTRATE 50 MG PO TABS
50.0000 mg | ORAL_TABLET | Freq: Once | ORAL | Status: AC
Start: 2015-05-07 — End: 2015-05-07
  Administered 2015-05-07: 50 mg via ORAL

## 2015-05-07 MED ORDER — ALBUTEROL SULFATE (2.5 MG/3ML) 0.083% IN NEBU: 2.5000 mg | INHALATION_SOLUTION | RESPIRATORY_TRACT | Status: DC | PRN

## 2015-05-07 MED ORDER — NITROGLYCERIN 2 % TD OINT
1.0000 [in_us] | TOPICAL_OINTMENT | Freq: Once | TRANSDERMAL | Status: AC
  Administered 2015-05-07: 1 [in_us] via TOPICAL
  Filled 2015-05-07: qty 1

## 2015-05-07 MED ORDER — ISOSORBIDE MONONITRATE ER 30 MG PO TB24
15.0000 mg | ORAL_TABLET | Freq: Every day | ORAL | Status: DC
  Administered 2015-05-07 – 2015-05-10 (×4): 15 mg via ORAL
  Filled 2015-05-07 (×5): qty 1

## 2015-05-07 MED ORDER — INSULIN ASPART PROT & ASPART (70-30 MIX) 100 UNIT/ML ~~LOC~~ SUSP
22.0000 [IU] | Freq: Two times a day (BID) | SUBCUTANEOUS | Status: DC
  Administered 2015-05-08 – 2015-05-10 (×3): 22 [IU] via SUBCUTANEOUS
  Filled 2015-05-07 (×2): qty 10

## 2015-05-07 MED ORDER — CALCITRIOL 0.25 MCG PO CAPS
0.2500 ug | ORAL_CAPSULE | ORAL | Status: DC
  Administered 2015-05-07 – 2015-05-09 (×2): 0.25 ug via ORAL
  Filled 2015-05-07 (×2): qty 1

## 2015-05-07 MED ORDER — SODIUM CHLORIDE 0.9 % IJ SOLN
3.0000 mL | Freq: Two times a day (BID) | INTRAMUSCULAR | Status: DC
  Administered 2015-05-07 – 2015-05-10 (×6): 3 mL via INTRAVENOUS

## 2015-05-07 MED ORDER — ZOLPIDEM TARTRATE 5 MG PO TABS
5.0000 mg | ORAL_TABLET | Freq: Once | ORAL | Status: AC
  Administered 2015-05-07: 5 mg via ORAL
  Filled 2015-05-07: qty 1

## 2015-05-07 MED ORDER — HYDRALAZINE HCL 25 MG PO TABS
37.5000 mg | ORAL_TABLET | Freq: Three times a day (TID) | ORAL | Status: DC
  Administered 2015-05-07 – 2015-05-10 (×9): 37.5 mg via ORAL
  Filled 2015-05-07: qty 2
  Filled 2015-05-07: qty 1.5
  Filled 2015-05-07 (×9): qty 2

## 2015-05-07 MED ORDER — FUROSEMIDE 80 MG PO TABS
160.0000 mg | ORAL_TABLET | Freq: Two times a day (BID) | ORAL | Status: DC
  Administered 2015-05-07 – 2015-05-08 (×3): 160 mg via ORAL
  Filled 2015-05-07: qty 2
  Filled 2015-05-07: qty 4
  Filled 2015-05-07: qty 8
  Filled 2015-05-07: qty 2

## 2015-05-07 MED ORDER — ACETAMINOPHEN 325 MG PO TABS
650.0000 mg | ORAL_TABLET | Freq: Four times a day (QID) | ORAL | Status: DC | PRN
  Administered 2015-05-09 (×2): 650 mg via ORAL
  Filled 2015-05-07 (×2): qty 2

## 2015-05-07 MED ORDER — METOPROLOL SUCCINATE ER 50 MG PO TB24
100.0000 mg | ORAL_TABLET | Freq: Two times a day (BID) | ORAL | Status: DC
  Administered 2015-05-07 – 2015-05-10 (×6): 100 mg via ORAL
  Filled 2015-05-07: qty 1
  Filled 2015-05-07: qty 2
  Filled 2015-05-07 (×3): qty 1
  Filled 2015-05-07: qty 2
  Filled 2015-05-07: qty 1

## 2015-05-07 MED ORDER — ACETAMINOPHEN 650 MG RE SUPP: 650.0000 mg | Freq: Four times a day (QID) | RECTAL | Status: DC | PRN

## 2015-05-07 MED ORDER — PHYTONADIONE 5 MG PO TABS
2.5000 mg | ORAL_TABLET | Freq: Once | ORAL | Status: AC
  Administered 2015-05-07: 2.5 mg via ORAL
  Filled 2015-05-07: qty 1

## 2015-05-07 NOTE — Consult Note (Signed)
Reason for Consult:CKD5 Referring Physician: Dr. Dimitri Ped R Manolito [name]. is an 81 y.o. male.  HPI: 81 yr male, with hx DM type 2, HTN, Prostate Ca, HPTH, ICM, DJD, ^ lipids, OSA, Afib, hx ablation, bivent pacer, skin Ca, CKD 5, with Cr 4.39 on 03/02/15 and is 4.65.   Prob since Fri pm, when he had PND, relieved with extra Lasix.  Did ok on Sat, and tol his usual CPAP.  On Sun worse and went to ED this am.         Has anorexia, not itching or cramping.  Has been active up to this point.  Now DOE with minimal exertion. Constitutional: as above Eyes: some decreased vision, wear glasses Ears, nose, mouth, throat, and face: some cough Respiratory: as above, no fevers or phlegm Cardiovascular: as above, not much ankle edema, no CP Gastrointestinal: negative Genitourinary:negative Integument/breast: negative Hematologic/lymphatic: no anemia despite low GFR Musculoskeletal:negative Neurological: negative Endocrine: BS 160s Allergic/Immunologic: Pred only ^ BS, ACEI   . Primary Nephrologist [name] [name]..  Access RUA avf.  Past Medical History  Diagnosis Date  . Chronic atrial fibrillation     a. Historically difficult rate control. b. s/p CRT-D implantation with anticipation of AV junction ablation but patient then was able to accomplish rate control and ablation not undertaken.; AVN ablation 08/2013 by Dr [name] [name]  . CARDIOMYOPATHY, PRIMARY, DILATED     a. Mixed ICM/NICM - EF 15% by echo 05/2009; St. Jude CRT-D implanted 06/2009. b. Echo 04/2013 - EF 20%, mild LVH.  [name] Chronic systolic CHF (congestive heart failure)   . CORONARY ARTERY DISEASE     a. BMS to LAD 04/2008. b. Cath 06/2009: nonobstructive disease.  [name] SLEEP APNEA   . Hypertension   . Hyperlipidemia   . Diabetes mellitus   . Osteoarthritis   . Tubular adenoma of colon   . Secondary hyperparathyroidism   . CKD (chronic kidney disease), stage IV     a. Right brachiocephalic AV fistula 11/7167. Neph = [name] [name].  . Vertigo   .  Valvular heart disease     a. Echo 04/2013: mild AI, mild MR.  . Pulmonary HTN     a. PA pressure 32mmHg by echo 05/01/13.  . Prostate cancer     a. s/p radical prostatectomy.  . Colon cancer   . Skin cancer   . Presence of permanent cardiac pacemaker     Past Surgical History  Procedure Laterality Date  . Colectomy    . Prostatectomy    . Penile prosthesis placement    . Removed prosthetic eye    . Ptca      stent placed  . Total knee arthroplasty  2008    right  . Pacemaker insertion  2010  . Av fistula placement  03/30/2012    Procedure: ARTERIOVENOUS (AV) FISTULA CREATION;  Surgeon: Angelia Mould, MD;  Location: [facility];  Service: Vascular;  Laterality: Right;  Creation of BrachioCephalic Fistula Right arm  . Eye surgery      left eye prothesis  . Fracture surgery      collar bone, right knee replacement  . Bi-ventricular implantable cardioverter defibrillator  (crt-d)  2010    STJ CRTD implanted by Dr [name] [name]  . Ablation  08/2013    AVN ablation by Dr [name] [name]  . Av node ablation N/A 09/12/2013    Procedure: AV NODE ABLATION;  Surgeon: [name] Lance, MD;  Location: [facility] CATH LAB;  Service: Cardiovascular;  Laterality: N/A;  .  Insert / replace / remove pacemaker    . Joint replacement      Family History  Problem Relation Age of Onset  . Heart disease Father   . Throat cancer Father   . Throat cancer Mother   . Heart disease Mother   . Hypertension Mother     Social History:  reports that he quit smoking about 48 years ago. His smoking use included Cigarettes. He has never used smokeless tobacco. He reports that he drinks alcohol. He reports that he does not use illicit drugs.  Allergies:  Allergies  Allergen Reactions  . Ace Inhibitors Other (See Comments)    Stopped by Dr. Burt [name] due to progressive renal failure  . Prednisone Other (See Comments)    Raised blood sugar to almost 900    Medications:  I have reviewed the patient's current medications. Prior  to Admission:  Prescriptions prior to admission  Medication Sig Dispense Refill Last Dose  . acetaminophen (TYLENOL) 500 MG tablet Take 500 mg by mouth as needed for mild pain.    05/06/2015 at Unknown time  . aspirin 81 MG chewable tablet Chew 81 mg by mouth daily.   05/06/2015 at Unknown time  . calcitRIOL (ROCALTROL) 0.25 MCG capsule Take 0.25 mcg by mouth. Monday, Wed and Friday   05/06/2015 at Unknown time  . Calcium Carbonate Antacid (TUMS SMOOTHIES PO) Take 1 each by mouth 3 (three) times daily after meals.   05/06/2015 at Unknown time  . DIGOX 125 MCG tablet Take 1 tablet by mouth  every other day 45 tablet 1 05/06/2015 at Unknown time  . furosemide (LASIX) 80 MG tablet Take 1 tablet by mouth two  times daily 180 tablet 1 05/06/2015 at Unknown time  . glucose blood (FREESTYLE LITE) test strip 1 each by Other route 2 (two) times daily as needed for other. Dx: 250.00 100 each 12 unknown  . hydrALAZINE (APRESOLINE) 25 MG tablet Take 1 and 1/2 tablets by  mouth 3 times daily 315 tablet 0 05/06/2015 at Unknown time  . insulin lispro protamine-lispro (HUMALOG 75/25 MIX) (75-25) 100 UNIT/ML SUSP injection Inject 26 Units into the skin 2 (two) times daily with a meal. 6 vial 1 05/06/2015 at Unknown time  . isosorbide mononitrate (IMDUR) 30 MG 24 hr tablet Take 0.5 tablets (15 mg total) by mouth daily. 90 tablet 1 05/06/2015  . Lancets (FREESTYLE) lancets 1 each by Other route 2 (two) times daily as needed for other. 100 each 12 unknown  . metoprolol succinate (TOPROL-XL) 100 MG 24 hr tablet Take 1 tablet by mouth  twice a day with or  immediately following meal 180 tablet 1 05/07/2015 at 0800  . potassium chloride SA (K-DUR,KLOR-CON) 20 MEQ tablet Take 2 tablets (40 mEq total) by mouth daily. (Patient taking differently: Take 20 mEq by mouth daily. ) 180 tablet 3 05/06/2015 at Unknown time  . warfarin (COUMADIN) 5 MG tablet On Monday and Friday take 2.5 mg; On Tuesday, Wednesday, Thursday, Saturday, and Sunday  takes 5 mg. Resume home dose on 3-18 (Patient taking differently: Take 2.5-5 mg by mouth daily. On Monday and Friday take 2.5 mg; On Tuesday, Wednesday, Thursday, Saturday, and Sunday takes 5 mg.) 90 tablet 1 05/06/2015 at Unknown time   , Calcitriol .60mcg po qd.    Results for orders placed or performed during the hospital encounter of 05/07/15 (from the past 48 hour(s))  CBC with Differential     Status: Abnormal   Collection Time: 05/07/15  9:40 AM  Result Value Ref Range   WBC 7.7 4.0 - 10.5 K/uL   RBC 3.92 (L) 4.22 - 5.81 MIL/uL   Hemoglobin 12.6 (L) 13.0 - 17.0 g/dL   HCT 38.1 (L) 39.0 - 52.0 %   MCV 97.2 78.0 - 100.0 fL   MCH 32.1 26.0 - 34.0 pg   MCHC 33.1 30.0 - 36.0 g/dL   RDW 14.3 11.5 - 15.5 %   Platelets 132 (L) 150 - 400 K/uL   Neutrophils Relative % 74 43 - 77 %   Neutro Abs 5.7 1.7 - 7.7 K/uL   Lymphocytes Relative 18 12 - 46 %   Lymphs Abs 1.4 0.7 - 4.0 K/uL   Monocytes Relative 6 3 - 12 %   Monocytes Absolute 0.5 0.1 - 1.0 K/uL   Eosinophils Relative 2 0 - 5 %   Eosinophils Absolute 0.1 0.0 - 0.7 K/uL   Basophils Relative 0 0 - 1 %   Basophils Absolute 0.0 0.0 - 0.1 K/uL  Comprehensive metabolic panel     Status: Abnormal   Collection Time: 05/07/15  9:40 AM  Result Value Ref Range   Sodium 142 135 - 145 mmol/L   Potassium 4.1 3.5 - 5.1 mmol/L   Chloride 106 101 - 111 mmol/L   CO2 21 (L) 22 - 32 mmol/L   Glucose, Bld 245 (H) 65 - 99 mg/dL   BUN 72 (H) 6 - 20 mg/dL   Creatinine, Ser 4.65 (H) 0.61 - 1.24 mg/dL   Calcium 9.7 8.9 - 10.3 mg/dL   Total Protein 6.8 6.5 - 8.1 g/dL   Albumin 4.0 3.5 - 5.0 g/dL   AST 22 15 - 41 U/L   ALT 15 (L) 17 - 63 U/L   Alkaline Phosphatase 27 (L) 38 - 126 U/L   Total Bilirubin 1.1 0.3 - 1.2 mg/dL   GFR calc non Af Amer 11 (L) >60 mL/min   GFR calc Af Amer 12 (L) >60 mL/min    Comment: (NOTE) The eGFR has been calculated using the CKD EPI equation. This calculation has not been validated in all clinical  situations. eGFR's persistently <60 mL/min signify possible Chronic Kidney Disease.    Anion gap 15 5 - 15  CBG monitoring, ED     Status: Abnormal   Collection Time: 05/07/15  9:41 AM  Result Value Ref Range   Glucose-Capillary 233 (H) 65 - 99 mg/dL   Comment 1 Notify RN   Troponin I     Status: Abnormal   Collection Time: 05/07/15  9:41 AM  Result Value Ref Range   Troponin I 0.06 (H) <0.031 ng/mL    Comment:        PERSISTENTLY INCREASED TROPONIN VALUES IN THE RANGE OF 0.04-0.49 ng/mL CAN BE SEEN IN:       -UNSTABLE ANGINA       -CONGESTIVE HEART FAILURE       -MYOCARDITIS       -CHEST TRAUMA       -ARRYHTHMIAS       -LATE PRESENTING MYOCARDIAL INFARCTION       -COPD   CLINICAL FOLLOW-UP RECOMMENDED.   Brain natriuretic peptide     Status: Abnormal   Collection Time: 05/07/15  9:41 AM  Result Value Ref Range   B Natriuretic Peptide 1284.9 (H) 0.0 - 100.0 pg/mL  Protime-INR     Status: Abnormal   Collection Time: 05/07/15  9:41 AM  Result Value Ref Range   Prothrombin Time  31.2 (H) 11.6 - 15.2 seconds   INR 3.08 (H) 0.00 - 1.49  Digoxin level     Status: Abnormal   Collection Time: 05/07/15  9:41 AM  Result Value Ref Range   Digoxin Level <0.2 (L) 0.8 - 2.0 ng/mL    Comment: RESULTS CONFIRMED BY MANUAL DILUTION    Dg Chest 2 View  05/07/2015   CLINICAL DATA:  Shortness of breath for 2 days.  EXAM: CHEST  2 VIEW  COMPARISON:  12/05/2014  FINDINGS: The lungs are hyperinflated likely secondary to COPD. There is no focal parenchymal opacity. There is no pleural effusion or pneumothorax. The heart and mediastinal contours are unremarkable. There is a dual lead AICD.  The osseous structures are unremarkable.  IMPRESSION: No active cardiopulmonary disease.   Electronically Signed   By: Kathreen Devoid   On: 05/07/2015 10:08    ROS Blood pressure 164/74, pulse 76, temperature 98.7 F (37.1 C), temperature source Oral, resp. rate 14, height $RemoveBe'5\' 11"'dAYESoxtS$  (1.803 m), weight 79.379 kg  (175 lb), SpO2 95 %. Physical Exam Physical Examination: General appearance - alert, well appearing, and in no distress, oriented to person, place, and time and anxious Mental status - alert, oriented to person, place, and time Eyes - pupils equal and reactive, extraocular eye movements intact Mouth - mucous membranes moist, pharynx normal without lesions Neck - adenopathy noted PCL Lymphatics - posterior cervical nodes Chest - bs decreased bilat, few rales in bases Heart - irregularly irregular rhythm with rate 05W, systolic murmur holosys PV9/4 at lower left sternal border Abdomen - soft, liver down 6 cm and firm Extremities - peripheral pulses normal, no pedal edema, no clubbing or cyanosis, pedal edema Tr- 1 +. AVF RUA Skin - pale  Assessment/Plan: 1 CKD 5 at his baseline, steadily worsening, not a lot of obvious vol, mild acidemia.  Much concern of this being not vol related.  Suspect is developing mild uremia.  Need to give trial diuretics but concern of ischemia large..   Would support cath, and might need HD after.  Will give some diuretics. 2 ICM per cards 3 Hypertension: mild ^, will give ^ diuretics, suspect Cr will worsen. 4. Anemia of ESRD: not an issue 5. Metabolic Bone Disease: On Vit D 6 DM controlled 7 Afib rate and anticoag 8 Prostate Ca 9 OSA needs mask P HD if needed, ^ diuretics, discussed with Cards.  [name] [name] L 05/07/2015, 6:27 PM

## 2015-05-07 NOTE — H&P (Addendum)
History and Physical  [name] [name]. BDZ:[id number] DOB: 17-Mar-1931 DOA: 05/07/2015  Referring physician: Dr. Orlie Dakin, EDP at [facility] PCP: Nyoka Cowden, MD  Outpatient Specialists:  1. Cardiology: Dr. Sherren Mocha 2. Nephrology: Dr. Jamal Maes  Chief Complaint: Dyspnea  HPI: [name] [name]. is a 81 y.o. male , with extensive PMH-CAD, chronic A. fib status post ablation and CRT-D implantation and on chronic Coumadin, chronic systolic CHF secondary to mixed cardiomyopathy, stage IV chronic kidney disease, HTN, HLD, DM 2 with renal complications, OSA on nightly CPAP, presented to [facility] on 05/07/15 with complaints of dyspnea. Patient states that he woke up last Friday night with dyspnea and was unable to tolerate CPAP. He thought that he had "excess fluid in his lungs" and decided to take an extra dose of Lasix for the next 24 hours. He felt slightly better on Saturday. He has some minimal dry cough. He has dyspnea on mild exertion which is also new. He noted mild leg swelling on Friday night which improved. He denies chest pain, fever or chills. His appetite has decreased and he feels generally weak. Last night again he had worsening dyspnea prompting him to go to the ED. Workup in the ED revealed creatinine of 4.65, glucose 245, hemoglobin 12.6, platelets 132, troponin 0.06 and chest x-ray without acute cardiopulmonary disease. Patient states that he does not know his weight training because his weighing machine as broken. He however thinks that he has actually lost some weight. Hospitalist admission was requested and patient was transferred to [facility] telemetry.   Review of Systems: All systems reviewed and apart from history of presenting illness, are negative.  Past Medical History  Diagnosis Date  . Chronic atrial fibrillation     a. Historically difficult rate control. b. s/p CRT-D implantation with anticipation of AV junction ablation  but patient then was able to accomplish rate control and ablation not undertaken.; AVN ablation 08/2013 by Dr Lovena Le  . CARDIOMYOPATHY, PRIMARY, DILATED     a. Mixed ICM/NICM - EF 15% by echo 05/2009; St. Jude CRT-D implanted 06/2009. b. Echo 04/2013 - EF 20%, mild LVH.  [name] Chronic systolic CHF (congestive heart failure)   . CORONARY ARTERY DISEASE     a. BMS to LAD 04/2008. b. Cath 06/2009: nonobstructive disease.  [name] SLEEP APNEA   . Hypertension   . Hyperlipidemia   . Diabetes mellitus   . Osteoarthritis   . Tubular adenoma of colon   . Secondary hyperparathyroidism   . CKD (chronic kidney disease), stage IV     a. Right brachiocephalic AV fistula 11/4194. Neph = [place].  . Vertigo   . Valvular heart disease     a. Echo 04/2013: mild AI, mild MR.  . Pulmonary HTN     a. PA pressure 15mmHg by echo 05/01/13.  . Prostate cancer     a. s/p radical prostatectomy.  . Colon cancer   . Skin cancer   . Presence of permanent cardiac pacemaker    Past Surgical History  Procedure Laterality Date  . Colectomy    . Prostatectomy    . Penile prosthesis placement    . Removed prosthetic eye    . Ptca      stent placed  . Total knee arthroplasty  2008    right  . Pacemaker insertion  2010  . Av fistula placement  03/30/2012    Procedure: ARTERIOVENOUS (AV) FISTULA CREATION;  Surgeon: Harrell Gave  Nicole Cella, MD;  Location: [facility];  Service: Vascular;  Laterality: Right;  Creation of BrachioCephalic Fistula Right arm  . Eye surgery      left eye prothesis  . Fracture surgery      collar bone, right knee replacement  . Bi-ventricular implantable cardioverter defibrillator  (crt-d)  2010    STJ CRTD implanted by Dr [name]  . Ablation  08/2013    AVN ablation by Dr Lovena Le  . Av node ablation N/A 09/12/2013    Procedure: AV NODE ABLATION;  Surgeon: [name], MD;  Location: [facility] CATH LAB;  Service: Cardiovascular;  Laterality: N/A;  . Insert / replace / remove pacemaker    . Joint replacement      Social History:  reports that he quit smoking about 48 years ago. His smoking use included Cigarettes. He has never used smokeless tobacco. He reports that he drinks alcohol. He reports that he does not use illicit drugs. Single. Lives alone. Independent of activities of daily living.  Allergies  Allergen Reactions  . Ace Inhibitors Other (See Comments)    Stopped by Dr. Burt Knack due to progressive renal failure  . Prednisone Other (See Comments)    Raised blood sugar to almost 900    Family History  Problem Relation Age of Onset  . Heart disease Father   . Throat cancer Father   . Throat cancer Mother   . Heart disease Mother   . Hypertension Mother     Prior to Admission medications   Medication Sig Start Date End Date Taking? Authorizing Provider  acetaminophen (TYLENOL) 500 MG tablet Take 500 mg by mouth as needed for mild pain.     Historical Provider, MD  aspirin 81 MG chewable tablet Chew 81 mg by mouth daily.    Historical Provider, MD  calcitRIOL (ROCALTROL) 0.25 MCG capsule Take 0.25 mcg by mouth. Monday, Wed and Friday 12/27/14   Historical Provider, MD  Calcium Carbonate Antacid (TUMS SMOOTHIES PO) Take 1 each by mouth 3 (three) times daily after meals.    Historical Provider, MD  [place] 125 MCG tablet Take 1 tablet by mouth  every other day 12/04/14   [name], MD  furosemide (LASIX) 80 MG tablet Take 1 tablet by mouth two  times daily 12/04/14   [name], MD  glucose blood (FREESTYLE LITE) test strip 1 each by Other route 2 (two) times daily as needed for other. Dx: 250.00 07/13/14   [name], MD  HUMALOG MIX 75/25 (75-25) 100 UNIT/ML SUSP injection Inject subcutaneously 26  units two times daily with  meals 05/04/15   [name], MD  hydrALAZINE (APRESOLINE) 25 MG tablet Take 1 and 1/2 tablets by  mouth 3 times daily 04/09/15   [name], MD  HYDROcodone-acetaminophen (NORCO/VICODIN) 5-325 MG per tablet Take 1-2 tablets by  mouth every 6 (six) hours as needed for moderate pain. 03/14/15   [name], MD  insulin lispro protamine-lispro (HUMALOG 75/25 MIX) (75-25) 100 UNIT/ML SUSP injection Inject 26 Units into the skin 2 (two) times daily with a meal. 12/12/14   [name], MD  isosorbide mononitrate (IMDUR) 30 MG 24 hr tablet Take 0.5 tablets (15 mg total) by mouth daily. 09/12/14   [name], MD  Lancets (FREESTYLE) lancets 1 each by Other route 2 (two) times daily as needed for other. 02/22/14   [name], MD  metoprolol succinate (TOPROL-XL) 100 MG 24 hr tablet  Take 1 tablet by mouth  twice a day with or  immediately following meal 12/04/14   [name], MD  potassium chloride SA (K-DUR,KLOR-CON) 20 MEQ tablet Take 2 tablets (40 mEq total) by mouth daily. Patient taking differently: Take 20 mEq by mouth daily.  12/07/14   Belkys A Regalado, MD  warfarin (COUMADIN) 5 MG tablet On Monday and Friday take 2.5 mg; On Tuesday, Wednesday, Thursday, Saturday, and Sunday takes 5 mg. Resume home dose on 3-18 12/07/14   Elmarie Shiley, MD   Physical Exam: Filed Vitals:   05/07/15 1230 05/07/15 1300 05/07/15 1431 05/07/15 1436  BP: 156/73 148/93 160/79 164/74  Pulse: 75 76 72 76  Temp:   98.7 F (37.1 C)   TempSrc:      Resp: 22 16 14    Height:      Weight:      SpO2: 97% 96% 95%      General exam: Moderately built and nourished pleasant elderly male patient, lying comfortably propped up in bed in no obvious distress.  Head, eyes and ENT: Nontraumatic and normocephalic. Pupils equally reacting to light and accommodation. Oral mucosa dry.  Neck: Supple. No JVD, carotid bruit or thyromegaly.  Lymphatics: No lymphadenopathy.  Respiratory system: Slightly diminished breath sounds in the bases with occasional basal crackles but otherwise clear to auscultation. No increased work of breathing.  Cardiovascular system: S1 and S2 heard, RRR. No JVD, murmurs, gallops, clicks or  pedal edema.  Gastrointestinal system: Abdomen is nondistended, soft and nontender. Normal bowel sounds heard. No organomegaly or masses appreciated.  Central nervous system: Alert and oriented. No focal neurological deficits.  Extremities: Symmetric 5 x 5 power. Peripheral pulses symmetrically felt. Right upper extremity AV fistula with thrill.  Skin: No rashes or acute findings.  Musculoskeletal system: Negative exam.  Psychiatry: Pleasant and cooperative.   Labs on Admission:  Basic Metabolic Panel:  Recent Labs Lab 05/07/15 0940  NA 142  K 4.1  CL 106  CO2 21*  GLUCOSE 245*  BUN 72*  CREATININE 4.65*  CALCIUM 9.7   Liver Function Tests:  Recent Labs Lab 05/07/15 0940  AST 22  ALT 15*  ALKPHOS 27*  BILITOT 1.1  PROT 6.8  ALBUMIN 4.0   No results for input(s): LIPASE, AMYLASE in the last 168 hours. No results for input(s): AMMONIA in the last 168 hours. CBC:  Recent Labs Lab 05/07/15 0940  WBC 7.7  NEUTROABS 5.7  HGB 12.6*  HCT 38.1*  MCV 97.2  PLT 132*   Cardiac Enzymes:  Recent Labs Lab 05/07/15 0941  TROPONINI 0.06*    BNP (last 3 results) No results for input(s): PROBNP in the last 8760 hours. CBG:  Recent Labs Lab 05/07/15 0941  GLUCAP 233*    Radiological Exams on Admission: Dg Chest 2 View  05/07/2015   CLINICAL DATA:  Shortness of breath for 2 days.  EXAM: CHEST  2 VIEW  COMPARISON:  12/05/2014  FINDINGS: The lungs are hyperinflated likely secondary to COPD. There is no focal parenchymal opacity. There is no pleural effusion or pneumothorax. The heart and mediastinal contours are unremarkable. There is a dual lead AICD.  The osseous structures are unremarkable.  IMPRESSION: No active cardiopulmonary disease.   Electronically Signed   By: Kathreen Devoid   On: 05/07/2015 10:08    EKG: Independently reviewed. Paced rythm  Assessment/Plan Principal Problem:   Systolic CHF, acute on chronic Active Problems:   Essential  hypertension   Coronary  atherosclerosis   Chronic atrial fibrillation   Sleep apnea   BIVentricular Defibrillator--St Jude   Diabetes mellitus with renal complications   Acute renal failure superimposed on stage 4 chronic kidney disease   Elevated troponin   1. Acute dyspnea: Unclear etiology. DD-acute on chronic systolic CHF, angina equivalent, uremia. PE less likely since patient therapeutically anticoagulated. Low output heart failure also on differentials. Admit to telemetry. Clinically does not look volume overloaded. Cardiology and nephrology consulted to assist with assessment and decision regarding diuresis versus? Hemodialysis. Digoxin level <0.2. Treat supportively. 2. Acute on stage IV chronic kidney disease: Patient states that during last visit with nephrology his creatinine was in the 4.1 range. May have worsened related to diuresis versus low output heart failure. Nephrology consulted for assistance. No acute indicators for dialysis. 3. Elevated troponin: Possibly from demand ischemia related to problem #1 and worsening renal functions. Continue to cycle troponins. Recently had echo in March 2016-LVEF 25-30 percent. Defer decision to repeat echo to cardiology. 4. Essential hypertension: Mildly uncontrolled. Resume home medications after they have been reconciled by pharmacy. 5. Chronic atrial fibrillation: Rate controlled. Anticoagulated. INR 3.08. Continue Coumadin per pharmacy. 6. Uncontrolled DM 2 with renal complications: Start insulin's after home meds have been reconciled. 7. OSA: Continue nightly CPAP 8. CAD: Management as above    DVT prophylaxis: On Coumadin anticoagulation Code Status: DNR- confirmed with patient in the presence of his family at bedside.  Family Communication: Discussed with patient's daughter and son-in-law at bedside  Disposition Plan: DC home when medically stable, possibly in the next 3-4 days   Time spent: 2 minutes  [name] Alen, MD, FACP,  FHM. [facility] Pager [phone]  If 7PM-7AM, please contact night-coverage [email or web address] Password [facility] 05/07/2015, 5:26 PM

## 2015-05-07 NOTE — ED Notes (Signed)
[facility] here at this time. 

## 2015-05-07 NOTE — ED Provider Notes (Addendum)
CSN: [id number]     Arrival date & time 05/07/15  9371 History   First MD Initiated Contact with Patient 05/07/15 [phone]     Chief Complaint  Patient presents with  . Shortness of Breath     (Consider location/radiation/quality/duration/timing/severity/associated sxs/prior Treatment) HPI Complains of shortness of breath gradual onset 3 nights ago. Symptoms worse with lying supine and with walking improved with sitting up. Denies any chest pain. Other associated symptoms include mild dry cough. And edema of both legs. No fever. He treated himself with an extra dose of Lasix 2 days ago, which improved edema of legs.. He states he feels no better today. He reports that at baseline can walk a mile. Today he was able to walk for 10-12 minutes with some dyspnea. He is not dyspneic when lying still. Past Medical History  Diagnosis Date  . Chronic atrial fibrillation     a. Historically difficult rate control. b. s/p CRT-D implantation with anticipation of AV junction ablation but patient then was able to accomplish rate control and ablation not undertaken.; AVN ablation 08/2013 by Dr Lovena Le  . CARDIOMYOPATHY, PRIMARY, DILATED     a. Mixed ICM/NICM - EF 15% by echo 05/2009; [employer] CRT-D implanted 06/2009. b. Echo 04/2013 - EF 20%, mild LVH.  [name] Chronic systolic CHF (congestive heart failure)   . CORONARY ARTERY DISEASE     a. BMS to LAD 04/2008. b. Cath 06/2009: nonobstructive disease.  [name] SLEEP APNEA   . Hypertension   . Hyperlipidemia   . Diabetes mellitus   . Osteoarthritis   . Tubular adenoma of colon   . Secondary hyperparathyroidism   . CKD (chronic kidney disease), stage IV     a. Right brachiocephalic AV fistula 04/9380. Neph = [place].  . Vertigo   . Valvular heart disease     a. Echo 04/2013: mild AI, mild MR.  . Pulmonary HTN     a. PA pressure 3mmHg by echo 05/01/13.  . Prostate cancer     a. s/p radical prostatectomy.  . Colon cancer   . Skin cancer   . Presence of permanent  cardiac pacemaker    Past Surgical History  Procedure Laterality Date  . Colectomy    . Prostatectomy    . Penile prosthesis placement    . Removed prosthetic eye    . Ptca      stent placed  . Total knee arthroplasty  2008    right  . Pacemaker insertion  2010  . Av fistula placement  03/30/2012    Procedure: ARTERIOVENOUS (AV) FISTULA CREATION;  Surgeon: Angelia Mould, MD;  Location: [place];  Service: Vascular;  Laterality: Right;  Creation of BrachioCephalic Fistula Right arm  . Eye surgery      left eye prothesis  . Fracture surgery      collar bone, right knee replacement  . Bi-ventricular implantable cardioverter defibrillator  (crt-d)  2010    STJ CRTD implanted by Dr [name]  . Ablation  08/2013    AVN ablation by Dr Lovena Le  . Av node ablation N/A 09/12/2013    Procedure: AV NODE ABLATION;  Surgeon: [name], MD;  Location: [facility] CATH LAB;  Service: Cardiovascular;  Laterality: N/A;  . Insert / replace / remove pacemaker    . Joint replacement     Family History  Problem Relation Age of Onset  . Heart disease Father   . Throat cancer Father   . Throat cancer Mother   .  Heart disease Mother   . Hypertension Mother    Social History  Substance Use Topics  . Smoking status: Former Smoker    Types: Cigarettes    Quit date: 09/22/1966  . Smokeless tobacco: Never Used  . Alcohol Use: 0.0 - 0.6 oz/week    0-1 Standard drinks or equivalent per week     Comment: occasional    Review of Systems  Constitutional: Negative.   HENT: Negative.   Respiratory: Positive for cough and shortness of breath.   Cardiovascular: Positive for leg swelling.  Gastrointestinal: Negative.   Musculoskeletal: Negative.   Skin: Negative.   Allergic/Immunologic: Positive for immunocompromised state.       Diabetic  Neurological: Negative.   Psychiatric/Behavioral: Negative.   All other systems reviewed and are negative.     Allergies  Ace inhibitors and Prednisone  Home  Medications   Prior to Admission medications   Medication Sig Start Date End Date Taking? Authorizing Provider  acetaminophen (TYLENOL) 500 MG tablet Take 500 mg by mouth as needed for mild pain.     Historical Provider, MD  aspirin 81 MG chewable tablet Chew 81 mg by mouth daily.    Historical Provider, MD  calcitRIOL (ROCALTROL) 0.25 MCG capsule Take 0.25 mcg by mouth. Monday, Wed and Friday 12/27/14   Historical Provider, MD  Calcium Carbonate Antacid (TUMS SMOOTHIES PO) Take 1 each by mouth 3 (three) times daily after meals.    Historical Provider, MD  [place] 125 MCG tablet Take 1 tablet by mouth  every other day 12/04/14   [name], MD  furosemide (LASIX) 80 MG tablet Take 1 tablet by mouth two  times daily 12/04/14   [name], MD  glucose blood (FREESTYLE LITE) test strip 1 each by Other route 2 (two) times daily as needed for other. Dx: 250.00 07/13/14   [name], MD  HUMALOG MIX 75/25 (75-25) 100 UNIT/ML SUSP injection Inject subcutaneously 26  units two times daily with  meals 05/04/15   [name], MD  hydrALAZINE (APRESOLINE) 25 MG tablet Take 1 and 1/2 tablets by  mouth 3 times daily 04/09/15   [name], MD  HYDROcodone-acetaminophen (NORCO/VICODIN) 5-325 MG per tablet Take 1-2 tablets by mouth every 6 (six) hours as needed for moderate pain. 03/14/15   [name], MD  insulin lispro protamine-lispro (HUMALOG 75/25 MIX) (75-25) 100 UNIT/ML SUSP injection Inject 26 Units into the skin 2 (two) times daily with a meal. 12/12/14   [name], MD  isosorbide mononitrate (IMDUR) 30 MG 24 hr tablet Take 0.5 tablets (15 mg total) by mouth daily. 09/12/14   [name], MD  Lancets (FREESTYLE) lancets 1 each by Other route 2 (two) times daily as needed for other. 02/22/14   [name], MD  metoprolol succinate (TOPROL-XL) 100 MG 24 hr tablet Take 1 tablet by mouth  twice a day with or  immediately following meal 12/04/14    [name], MD  potassium chloride SA (K-DUR,KLOR-CON) 20 MEQ tablet Take 2 tablets (40 mEq total) by mouth daily. Patient taking differently: Take 20 mEq by mouth daily.  12/07/14   Belkys A Regalado, MD  warfarin (COUMADIN) 5 MG tablet On Monday and Friday take 2.5 mg; On Tuesday, Wednesday, Thursday, Saturday, and Sunday takes 5 mg. Resume home dose on 3-18 12/07/14   Belkys A Regalado, MD   BP 150/73 mmHg  Pulse 74  Temp(Src) 98.7 F (37.1 C) (Oral)  Resp 20  Ht 5\' 11"  (1.803 m)  Wt 175 lb (79.379 kg)  BMI 24.42 kg/m2  SpO2 100% Physical Exam  Constitutional: He appears well-developed and well-nourished.  HENT:  Head: Normocephalic and atraumatic.  Eyes: Conjunctivae are normal.  Left eye prosthesis  Neck: Neck supple. No JVD present. No tracheal deviation present. No thyromegaly present.  Cardiovascular: Normal rate and regular rhythm.   No murmur heard. Pulmonary/Chest: Effort normal and breath sounds normal. No respiratory distress.  Abdominal: Soft. Bowel sounds are normal. He exhibits no distension. There is no tenderness.  Musculoskeletal: Normal range of motion. He exhibits edema. He exhibits no tenderness.  Trace pretibial pitting edema bilaterally. Right upper extremity with dialysis fistula with good thrill  Neurological: He is alert. Coordination normal.  Skin: Skin is warm and dry. No rash noted.  Psychiatric: He has a normal mood and affect.  Nursing note and vitals reviewed.   ED Course  Procedures (including critical care time) Labs Review Labs Reviewed  CBC WITH DIFFERENTIAL/PLATELET  COMPREHENSIVE METABOLIC PANEL    Imaging Review No results found. I, Orlie Dakin, personally reviewed and evaluated these images and lab results as part of my medical decision-making.   EKG Interpretation None     11 AM resting comfortably, asymptomatic Chest x-ray viewed by me. Results for orders placed or performed during the hospital encounter of  05/07/15  CBC with Differential  Result Value Ref Range   WBC 7.7 4.0 - 10.5 K/uL   RBC 3.92 (L) 4.22 - 5.81 MIL/uL   Hemoglobin 12.6 (L) 13.0 - 17.0 g/dL   HCT 38.1 (L) 39.0 - 52.0 %   MCV 97.2 78.0 - 100.0 fL   MCH 32.1 26.0 - 34.0 pg   MCHC 33.1 30.0 - 36.0 g/dL   RDW 14.3 11.5 - 15.5 %   Platelets 132 (L) 150 - 400 K/uL   Neutrophils Relative % 74 43 - 77 %   Neutro Abs 5.7 1.7 - 7.7 K/uL   Lymphocytes Relative 18 12 - 46 %   Lymphs Abs 1.4 0.7 - 4.0 K/uL   Monocytes Relative 6 3 - 12 %   Monocytes Absolute 0.5 0.1 - 1.0 K/uL   Eosinophils Relative 2 0 - 5 %   Eosinophils Absolute 0.1 0.0 - 0.7 K/uL   Basophils Relative 0 0 - 1 %   Basophils Absolute 0.0 0.0 - 0.1 K/uL  Comprehensive metabolic panel  Result Value Ref Range   Sodium 142 135 - 145 mmol/L   Potassium 4.1 3.5 - 5.1 mmol/L   Chloride 106 101 - 111 mmol/L   CO2 21 (L) 22 - 32 mmol/L   Glucose, Bld 245 (H) 65 - 99 mg/dL   BUN 72 (H) 6 - 20 mg/dL   Creatinine, Ser 4.65 (H) 0.61 - 1.24 mg/dL   Calcium 9.7 8.9 - 10.3 mg/dL   Total Protein 6.8 6.5 - 8.1 g/dL   Albumin 4.0 3.5 - 5.0 g/dL   AST 22 15 - 41 U/L   ALT 15 (L) 17 - 63 U/L   Alkaline Phosphatase 27 (L) 38 - 126 U/L   Total Bilirubin 1.1 0.3 - 1.2 mg/dL   GFR calc non Af Amer 11 (L) >60 mL/min   GFR calc Af Amer 12 (L) >60 mL/min   Anion gap 15 5 - 15  Troponin I  Result Value Ref Range   Troponin I 0.06 (H) <0.031 ng/mL  Brain natriuretic peptide  Result Value Ref Range  B Natriuretic Peptide 1284.9 (H) 0.0 - 100.0 pg/mL  Protime-INR  Result Value Ref Range   Prothrombin Time 31.2 (H) 11.6 - 15.2 seconds   INR 3.08 (H) 0.00 - 1.49  CBG monitoring, ED  Result Value Ref Range   Glucose-Capillary 233 (H) 65 - 99 mg/dL   Comment 1 Notify RN    Dg Chest 2 View  05/07/2015   CLINICAL DATA:  Shortness of breath for 2 days.  EXAM: CHEST  2 VIEW  COMPARISON:  12/05/2014  FINDINGS: The lungs are hyperinflated likely secondary to COPD. There is no  focal parenchymal opacity. There is no pleural effusion or pneumothorax. The heart and mediastinal contours are unremarkable. There is a dual lead AICD.  The osseous structures are unremarkable.  IMPRESSION: No active cardiopulmonary disease.   Electronically Signed   By: Kathreen Devoid   On: 05/07/2015 10:08    MDM  Patient with dyspnea on exertion, likely exacerbation of CHF however may also be anginal equivalent. Final diagnoses:  None  nitroglycerin ointment ordered to reduce preload. Will withold further diuresis in light of acute kidney injury I spoke with Dr.Ghimire. Plan transfer Blaine totelemetry. Suggest nephrology and cardiology consults, as renal function worse than 2 months ago. Diagnoses #1 dyspnea #2 acute kidney injury #4 hyperglycemia #5 thrombocytopenia     Orlie Dakin, MD 05/07/15 1108  Orlie Dakin, MD 05/07/15 [place], MD 05/07/15 1130   2:25 PM resting comfortably. No distress  Orlie Dakin, MD 05/07/15 1427

## 2015-05-07 NOTE — ED Notes (Signed)
Patient transported to X-ray 

## 2015-05-07 NOTE — Consult Note (Signed)
Cardiology Consultation Note  Patient ID: [name] [name]., MRN: [id number], DOB/AGE: [birth year] 81 y.o. Admit date: 05/07/2015   Date of Consult: 05/07/2015 Primary Physician: Nyoka Cowden, MD Primary Cardiologist: Dr. Burt Knack  Chief Complaint: shortness of breath Reason for Consultation: shortness of breath  HPI: [name] is an 81 y/o M with history of CAD s/p BMS to LAD 2009, chronic atrial fibrillation s/p AVN ablation and CRT-D implantation, chronic systolic CHF (felt out of proportion to CAD thus mixed NICM), CKD stage IV (AV fistula placed 03/2012), HTN, HLD, DM, secondary hyperparathyroidism, pulm HTN with elevated PASP by prior echo, OSA on CPAP, colon CA who presented to [facility] upon transfer from [facility] with dyspnea and malaise. Last cath 2010 with continued patency of LAD, otherwise scattered noncritical disease. Last echo 11/2014 mild LVH, EF 25-30%, diffuse HK, mild AR, mild MR, PASP 68mmHg.   He has spent most of the last few weeks in [place] and has felt fairly stable. However, on Friday night/Sat AM he woke up acutely with dyspnea while using his CPAP. He felt like he was being strangulated and so he took the CPAP off. He noticed significant improvement in dyspnea when he sat up. He also noticed mild pitting ankle edema. Around 4am Saturday he took 80mg  of Lasix, repeated the dose around 10am, and then again at 4pm in hopes of removing excess volume. He reported good urine output with this but he's not sure if it was any more than usual. He took it easy on Saturday and did not have many symptoms. Ankle edema had resolved. He was able to sleep OK. Yesterday he noted increased SOB with exertion requiring him to rest sooner than usual. He took his usual dose of Lasix. He called his PCP this AM for advice who referred him to the ER for evaluation. He reports easy DOE with minimal activity, general weakness, and decreased appetite. He also reports  increased frequency of dry cough - says he's had one intermittently for a while but it's more noticeable the last few days. He says in general since losing his wife, eating for one has been more challenging. He denies any nausea, vomiting, diaphoresis, bleeding, or chest pain. He usually weighs at home but scale has been broken recently. Weight here is down 2lbs from last office visit. Workup so far shows labs notable for BNP 1283, troponin 0.06, BUN 72/Cr 4.65 (prev 72/3.98 -> per renal, recent 4.39), K 4.1, Hgb 12.6, Plt 132, normal WBC, INR 3.08, digoxin level <0.2. CXR nonacute. Vitals notable for HR in the 60s-70s, pulse ox generally WNL on O2, BP 150s/70s.  Past Medical History  Diagnosis Date  . Chronic atrial fibrillation     a. Historically difficult rate control. b. s/p CRT-D implantation with anticipation of AV junction ablation but patient then was able to accomplish rate control and ablation not undertaken.; AVN ablation 08/2013 by Dr Lovena Le  . CARDIOMYOPATHY, PRIMARY, DILATED     a. Mixed ICM/NICM - EF 15% by echo 05/2009; St. Jude CRT-D implanted 06/2009. b. Echo 04/2013 - EF 20%, mild LVH.  [name] Chronic systolic CHF (congestive heart failure)   . CORONARY ARTERY DISEASE     a. BMS to LAD 04/2008. b. Cath 06/2009: nonobstructive disease.  [name] SLEEP APNEA   . Hypertension   . Hyperlipidemia   . Diabetes mellitus   . Osteoarthritis   . Tubular adenoma of colon   . Secondary hyperparathyroidism   . CKD (  chronic kidney disease), stage IV     a. Right brachiocephalic AV fistula 01/3663. Neph = [place].  . Vertigo   . Valvular heart disease     a. Echo 04/2013: mild AI, mild MR.  . Pulmonary HTN     a. PA pressure 69mmHg by echo 05/01/13.  . Prostate cancer     a. s/p radical prostatectomy.  . Colon cancer   . Skin cancer   . Presence of permanent cardiac pacemaker       Most Recent Cardiac Studies: 2D Echo 11/2014 - Left ventricle: The cavity size was mildly dilated. Wall thickness was  increased in a pattern of mild LVH. Systolicfunction was severely reduced. The estimated ejection fractionwas in the range of 25% to 30%. Diffuse hypokinesis. - Aortic valve: Sclerosis without stenosis. There was mildregurgitation. - Mitral valve: There was mild regurgitation. - Left atrium: The atrium was mildly dilated. - Right atrium: The atrium was mildly dilated. - Pulmonary arteries: PA peak pressure: 48 mm Hg (S).   Surgical History:  Past Surgical History  Procedure Laterality Date  . Colectomy    . Prostatectomy    . Penile prosthesis placement    . Removed prosthetic eye    . Ptca      stent placed  . Total knee arthroplasty  2008    right  . Pacemaker insertion  2010  . Av fistula placement  03/30/2012    Procedure: ARTERIOVENOUS (AV) FISTULA CREATION;  Surgeon: Angelia Mould, MD;  Location: [facility];  Service: Vascular;  Laterality: Right;  Creation of BrachioCephalic Fistula Right arm  . Eye surgery      left eye prothesis  . Fracture surgery      collar bone, right knee replacement  . Bi-ventricular implantable cardioverter defibrillator  (crt-d)  2010    STJ CRTD implanted by Dr [name]  . Ablation  08/2013    AVN ablation by Dr Lovena Le  . Av node ablation N/A 09/12/2013    Procedure: AV NODE ABLATION;  Surgeon: [name], MD;  Location: [facility] C[facility] LAB;  Service: Cardiovascular;  Laterality: N/A;  . Insert / replace / remove pacemaker    . Joint replacement       Home Meds: Prior to Admission medications   Medication Sig Start Date End Date Taking? Authorizing Provider  acetaminophen (TYLENOL) 500 MG tablet Take 500 mg by mouth as needed for mild pain.     Historical Provider, MD  aspirin 81 MG chewable tablet Chew 81 mg by mouth daily.    Historical Provider, MD  calcitRIOL (ROCALTROL) 0.25 MCG capsule Take 0.25 mcg by mouth. Monday, Wed and Friday 12/27/14   Historical Provider, MD  Calcium Carbonate Antacid (TUMS SMOOTHIES PO) Take 1 each by mouth 3  (three) times daily after meals.    Historical Provider, MD  [place] 125 MCG tablet Take 1 tablet by mouth  every other day 12/04/14   [name], MD  furosemide (LASIX) 80 MG tablet Take 1 tablet by mouth two  times daily 12/04/14   [name], MD  glucose blood (FREESTYLE LITE) test strip 1 each by Other route 2 (two) times daily as needed for other. Dx: 250.00 07/13/14   [name], MD  HUMALOG MIX 75/25 (75-25) 100 UNIT/ML SUSP injection Inject subcutaneously 26  units two times daily with  meals 05/04/15   [name], MD  hydrALAZINE (APRESOLINE) 25 MG tablet Take 1 and 1/2 tablets by  mouth 3 times daily 04/09/15   [name], MD  HYDROcodone-acetaminophen (NORCO/VICODIN) 5-325 MG per tablet Take 1-2 tablets by mouth every 6 (six) hours as needed for moderate pain. 03/14/15   [name], MD  insulin lispro protamine-lispro (HUMALOG 75/25 MIX) (75-25) 100 UNIT/ML SUSP injection Inject 26 Units into the skin 2 (two) times daily with a meal. 12/12/14   [name], MD  isosorbide mononitrate (IMDUR) 30 MG 24 hr tablet Take 0.5 tablets (15 mg total) by mouth daily. 09/12/14   [name], MD  Lancets (FREESTYLE) lancets 1 each by Other route 2 (two) times daily as needed for other. 02/22/14   [name], MD  metoprolol succinate (TOPROL-XL) 100 MG 24 hr tablet Take 1 tablet by mouth  twice a day with or  immediately following meal 12/04/14   [name], MD  potassium chloride SA (K-DUR,KLOR-CON) 20 MEQ tablet Take 2 tablets (40 mEq total) by mouth daily. Patient taking differently: Take 20 mEq by mouth daily.  12/07/14   Belkys A Regalado, MD  warfarin (COUMADIN) 5 MG tablet On Monday and Friday take 2.5 mg; On Tuesday, Wednesday, Thursday, Saturday, and Sunday takes 5 mg. Resume home dose on 3-18 12/07/14   Belkys A Regalado, MD    Inpatient Medications:  . sodium chloride  3 mL Intravenous Q12H      Allergies:    Allergies  Allergen Reactions  . Ace Inhibitors Other (See Comments)    Stopped by Dr. Burt Knack due to progressive renal failure  . Prednisone Other (See Comments)    Raised blood sugar to almost 900    Social History   Social History  . Marital Status: Widowed    Spouse Name: N/A  . Number of Children: 4  . Years of Education: N/A   Occupational History  . retired    Social History Main Topics  . Smoking status: Former Smoker    Types: Cigarettes    Quit date: 09/22/1966  . Smokeless tobacco: Never Used  . Alcohol Use: 0.0 - 0.6 oz/week    0-1 Standard drinks or equivalent per week     Comment: 05/07/2015 "might have a couple drinks/month"  . Drug Use: No  . Sexual Activity: No   Other Topics Concern  . Not on file   Social History Narrative     Family History  Problem Relation Age of Onset  . Heart disease Father   . Throat cancer Father   . Throat cancer Mother   . Heart disease Mother   . Hypertension Mother      Review of Systems: All other systems reviewed and are otherwise negative except as noted above.  Labs:  Recent Labs  05/07/15 0941  TROPONINI 0.06*   Lab Results  Component Value Date   WBC 7.7 05/07/2015   HGB 12.6* 05/07/2015   HCT 38.1* 05/07/2015   MCV 97.2 05/07/2015   PLT 132* 05/07/2015    Recent Labs Lab 05/07/15 0940  NA 142  K 4.1  CL 106  CO2 21*  BUN 72*  CREATININE 4.65*  CALCIUM 9.7  PROT 6.8  BILITOT 1.1  ALKPHOS 27*  ALT 15*  AST 22  GLUCOSE 245*   Lab Results  Component Value Date   CHOL 165 02/16/2014   HDL 31.40* 02/16/2014   LDLCALC 108* 02/16/2014   TRIG 128.0 02/16/2014   Lab Results  Component Value Date   DDIMER 3.15* 02/23/2015    Radiology/Studies:  Dg Chest 2 View  05/07/2015   CLINICAL DATA:  Shortness of breath for 2 days.  EXAM: CHEST  2 VIEW  COMPARISON:  12/05/2014  FINDINGS: The lungs are hyperinflated likely secondary to COPD. There is no focal parenchymal opacity. There is no  pleural effusion or pneumothorax. The heart and mediastinal contours are unremarkable. There is a dual lead AICD.  The osseous structures are unremarkable.  IMPRESSION: No active cardiopulmonary disease.   Electronically Signed   By: Kathreen Devoid   On: 05/07/2015 10:08    Wt Readings from Last 3 Encounters:  05/07/15 175 lb (79.379 kg)  03/15/15 177 lb 6.4 oz (80.468 kg)  03/14/15 175 lb (79.379 kg)    EKG: V Paced rhythm 75bpm, one PVC noted  Physical Exam: Blood pressure 164/74, pulse 76, temperature 98.7 F (37.1 C), temperature source Oral, resp. rate 14, height 5\' 11"  (1.803 m), weight 175 lb (79.379 kg), SpO2 95 %. General: Well developed, well nourished WM in no acute distress. Head: Normocephalic, atraumatic, sclera non-icteric, no xanthomas, nares are without discharge.  Neck: Negative for carotid bruits. JVD not significantly elevated. Lungs: Clear bilaterally to auscultation without wheezes, rales, or rhonchi. Breathing is unlabored. Heart: RRR with S1 S2. Soft high pitched SEM noted. No rubs or gallops appreciated. Abdomen: Soft, non-tender, non-distended with normoactive bowel sounds. No hepatomegaly. No rebound/guarding. No obvious abdominal masses. Msk:  Strength and tone appear normal for age. Extremities: No clubbing or cyanosis. No significant edema.  Distal pedal pulses are 2+ and equal bilaterally. Neuro: Alert and oriented X 3. No facial asymmetry. No focal deficit. Moves all extremities spontaneously. Psych:  Responds to questions appropriately with a normal affect.    Assessment and Plan:   1. Dyspnea, weakness, anorexia 2. Chronic systolic CHF 3. AKI on CKD stage IV-V (h/o AV fistula 2013) with elevated BUN 4. CAD s/p PCI in 2009 5. Moderate pulm HTN by echo 11/2014 6. OSA, compliant with CPAP 7. Chronic AF s/p AVN ablation and St. Jude CRT-D  Etiology of dyspnea not totally clear. CXR unremarkable, weight is stable, and he does not appear acutely volume  overloaded. PE less likely given therapeutic INR. Differential includes progressive CAD (although no recent CP), ?occult pulm process, low output CHF. Discussed further evaluation with MD and we agree he would benefit from cardiac cath to exclude progressive blockage. The risks, benefits, and alternatives were discussed with the patient including the strong possibility that he may require dialysis after the procedure due to his poor baseline kidney function. He would like to speak with Dr. Burt Knack about this before proceeding. We will hold Coumadin and trend INR downward. Follow troponin. Hold off on heparin given supratherapeutic INR. With worsening renal function will also hold digoxin as risk outweighs benefit. Nephrology to see regarding diuretic. Check lipids in AM. Will also need to interrogate St. Jude device in AM to exclude any functional abnormalities. Dr. Marlou Porch discussed the patient's code status - he wants to be DNR (as discussed with IM), but for now he will keep his ICD functionality in place. We can readdress this as his hospitalization progresses.  SignedMelina Copa PA-C 05/07/2015, 5:32 PM Pager: [phone]  Personally seen and examined. Agree with above.  81 year old male with known coronary artery disease, cardiomyopathy mixed picture with chronic kidney disease stage IV-5.  Chest x-ray does not appear to have any evidence of pulmonary edema. Lung exam appears normal. He is feeling quite significant shortness of breath with activity. He is  actually below his dry weight.  Spoke with Dr. Jimmy Footman, agree that this could be ischemic in origin. Because of this, angiogram would be necessary. I had lengthy conversation with he and his family. They understand the high risk of worsening renal function given his underlying creatinine. They understand the possibility of hemodialysis. He asked me to discuss this further with Dr. Burt Knack his cardiologist. We had a conversation and he will discuss  with him on Wednesday morning when he is here in the catheterization lab. In agreement with proceeding.  Continue with CRT defibrillator therapy at this point. After procedure, consider discontinuation of defibrillation therapies.  We will give 2.5 mg of vitamin K by mouth. Holding Coumadin.  Current INR 3.  Once again, challenging situation.  We will be following along.  Candee Furbish, MD

## 2015-05-07 NOTE — Progress Notes (Signed)
ANTICOAGULATION CONSULT NOTE - Initial Consult  Pharmacy Consult for Coumadin Indication: atrial fibrillation  Allergies  Allergen Reactions  . Ace Inhibitors Other (See Comments)    Stopped by Dr. Burt Knack due to progressive renal failure  . Prednisone Other (See Comments)    Raised blood sugar to almost 900    Patient Measurements: Height: 5\' 11"  (180.3 cm) Weight: 175 lb (79.379 kg) IBW/kg (Calculated) : 75.3  Vital Signs: Temp: 98.7 F (37.1 C) (08/15 1431) Temp Source: Oral (08/15 0925) BP: 164/74 mmHg (08/15 1436) Pulse Rate: 76 (08/15 1436)  Labs:  Recent Labs  05/07/15 0940 05/07/15 0941  HGB 12.6*  --   HCT 38.1*  --   PLT 132*  --   LABPROT  --  31.2*  INR  --  3.08*  CREATININE 4.65*  --   TROPONINI  --  0.06*    Estimated Creatinine Clearance: 13 mL/min (by C-G formula based on Cr of 4.65).   Medical History: Past Medical History  Diagnosis Date  . Chronic atrial fibrillation     a. Historically difficult rate control. b. s/p CRT-D implantation with anticipation of AV junction ablation but patient then was able to accomplish rate control and ablation not undertaken.; AVN ablation 08/2013 by Dr Lovena Le  . CARDIOMYOPATHY, PRIMARY, DILATED     a. Mixed ICM/NICM - EF 15% by echo 05/2009; St. Jude CRT-D implanted 06/2009. b. Echo 04/2013 - EF 20%, mild LVH.  [name] Chronic systolic CHF (congestive heart failure)   . CORONARY ARTERY DISEASE     a. BMS to LAD 04/2008. b. Cath 06/2009: nonobstructive disease.  [name] SLEEP APNEA   . Hypertension   . Hyperlipidemia   . Diabetes mellitus   . Osteoarthritis   . Tubular adenoma of colon   . Secondary hyperparathyroidism   . CKD (chronic kidney disease), stage IV     a. Right brachiocephalic AV fistula 10/6201. Neph = [place]ham.  . Vertigo   . Valvular heart disease     a. Echo 04/2013: mild AI, mild MR.  . Pulmonary HTN     a. PA pressure 68mmHg by echo 05/01/13.  . Prostate cancer     a. s/p radical prostatectomy.  .  Colon cancer   . Skin cancer   . Presence of permanent cardiac pacemaker     Assessment: 81 year old male on Coumadin PTA for Afib followed by [facility] admitted with SOB  Last anti-coag visit dose = 2.5 mg Monday and Friday, 5 mg other days  Admit INR = 3.08  Goal of Therapy:  INR 2-3 Monitor platelets by anticoagulation protocol: Yes   Plan:  Hold Coumadin tonight (to receive smaller dose today) Follow up daily INR  Thank you. Anette Guarneri, PharmD [phone]  05/07/2015,5:33 PM

## 2015-05-07 NOTE — ED Notes (Signed)
Pt returned from xray

## 2015-05-07 NOTE — ED Notes (Signed)
MD at bedside discussing results with patient and family. 

## 2015-05-07 NOTE — Progress Notes (Signed)
Spoke with patient about bringing him a CPAP for the night. Patient does not like our machines and refuses CPAP at this time. RT will continue to monitor.

## 2015-05-07 NOTE — ED Notes (Signed)
Pt has bed assigned at [name]. Awaiting transport at this time.

## 2015-05-07 NOTE — Telephone Encounter (Signed)
Patient checked into ED 

## 2015-05-07 NOTE — ED Notes (Signed)
Pt reports SHOB starting on Friday night. Reports history of CHF. Reports general malaise and weakness. Denies pain. Pts left leg is more swollen than right. Pt appears in NO distress at this time.

## 2015-05-07 NOTE — ED Notes (Signed)
Report called to Cybill, Therapist, sports at [facility] 6E-06

## 2015-05-07 NOTE — Telephone Encounter (Signed)
Patient Name: [name] [name]  DOB: Jul 23, 1931    Initial Comment Caller states he has problems breathing and he now has a cough.    Nurse Assessment  Nurse: Leilani Merl, RN, Heather Date/Time (Eastern Time): 05/07/2015 8:12:49 AM  Confirm and document reason for call. If symptomatic, describe symptoms. ---Caller states he has problems breathing and he now has a cough. This started on Saturday and he is still having it this morning.  Has the patient traveled out of the country within the last 30 days? ---Not Applicable  Does the patient require triage? ---Yes  Related visit to physician within the last 2 weeks? ---No  Does the PT have any chronic conditions? (i.e. diabetes, asthma, etc.) ---Yes  List chronic conditions. ---heart failure     Guidelines    Guideline Title Affirmed Question Affirmed Notes  Cough - Acute Non-Productive Chest pain (Exception: MILD central chest pain, present only when coughing)    Final Disposition User   Go to ED Now Standifer, RN, Nira Conn    Disagree/Comply: Comply

## 2015-05-07 NOTE — Progress Notes (Signed)
Admission note:  Arrival Method: Patient arrived from [place] ED, carelink. Mental Orientation:  Alert and oriented x 4. Telemetry: 6E-06 Paced. 74 HR Assessment: See doc flow sheets. Skin: Assessed by two nurses (Cybill), no open areas, rashes or any wound noted.  Dry scabs noted on the right leg, back and on the face. IV: Patent and saline lock Left AC. Pain: Denies any pain currently. Tubes: N/A. Safety Measures: Bed in low position, call bell and phone within reach. Fall Prevention Safety Plan: Reviewed the plan with patient, understood and acknowledged. Admission Screening: In progress. 6700 Orientation: Patient has been oriented to the unit, staff and to the room.

## 2015-05-08 DIAGNOSIS — N184 Chronic kidney disease, stage 4 (severe): Secondary | ICD-10-CM

## 2015-05-08 DIAGNOSIS — N179 Acute kidney failure, unspecified: Secondary | ICD-10-CM

## 2015-05-08 DIAGNOSIS — G4730 Sleep apnea, unspecified: Secondary | ICD-10-CM

## 2015-05-08 DIAGNOSIS — I10 Essential (primary) hypertension: Secondary | ICD-10-CM

## 2015-05-08 DIAGNOSIS — I2510 Atherosclerotic heart disease of native coronary artery without angina pectoris: Secondary | ICD-10-CM

## 2015-05-08 LAB — LIPID PANEL
CHOL/HDL RATIO: 6.9 ratio
Cholesterol: 172 mg/dL (ref 0–200)
HDL: 25 mg/dL — ABNORMAL LOW (ref >40)
LDL CALC: 105 mg/dL — AB (ref 0–99)
TRIGLYCERIDES: 211 mg/dL — AB (ref <150)
VLDL: 42 mg/dL — AB (ref 0–40)

## 2015-05-08 LAB — GLUCOSE, CAPILLARY
GLUCOSE-CAPILLARY: 199 mg/dL — AB (ref 65–99)
Glucose-Capillary: 227 mg/dL — ABNORMAL HIGH (ref 65–99)
Glucose-Capillary: 234 mg/dL — ABNORMAL HIGH (ref 65–99)
Glucose-Capillary: 113 mg/dL — ABNORMAL HIGH (ref 65–99)

## 2015-05-08 LAB — BASIC METABOLIC PANEL
Anion gap: 11 (ref 5–15)
BUN: 67 mg/dL — ABNORMAL HIGH (ref 6–20)
CALCIUM: 8.9 mg/dL (ref 8.9–10.3)
CO2: 24 mmol/L (ref 22–32)
CREATININE: 4.56 mg/dL — AB (ref 0.61–1.24)
Chloride: 104 mmol/L (ref 101–111)
GFR, EST AFRICAN AMERICAN: 13 mL/min — AB (ref >60)
GFR, EST NON AFRICAN AMERICAN: 11 mL/min — AB (ref >60)
Glucose, Bld: 202 mg/dL — ABNORMAL HIGH (ref 65–99)
Potassium: 3.3 mmol/L — ABNORMAL LOW (ref 3.5–5.1)
Sodium: 139 mmol/L (ref 135–145)

## 2015-05-08 LAB — CBC
HCT: 34 % — ABNORMAL LOW (ref 39.0–52.0)
Hemoglobin: 11.4 g/dL — ABNORMAL LOW (ref 13.0–17.0)
MCH: 32.1 pg (ref 26.0–34.0)
MCHC: 33.5 g/dL (ref 30.0–36.0)
MCV: 95.8 fL (ref 78.0–100.0)
PLATELETS: 104 10*3/uL — AB (ref 150–400)
RBC: 3.55 MIL/uL — AB (ref 4.22–5.81)
RDW: 14.2 % (ref 11.5–15.5)
WBC: 5.9 10*3/uL (ref 4.0–10.5)

## 2015-05-08 LAB — PHOSPHORUS: Phosphorus: 4.3 mg/dL (ref 2.5–4.6)

## 2015-05-08 LAB — TROPONIN I
TROPONIN I: 0.06 ng/mL — AB (ref <0.031)
TROPONIN I: 0.08 ng/mL — AB (ref <0.031)

## 2015-05-08 LAB — ALBUMIN: Albumin: 3.2 g/dL — ABNORMAL LOW (ref 3.5–5.0)

## 2015-05-08 LAB — PROTIME-INR
INR: 2.71 — AB (ref 0.00–1.49)
PROTHROMBIN TIME: 28.3 s — AB (ref 11.6–15.2)

## 2015-05-08 MED ORDER — DIGOXIN 125 MCG PO TABS
0.1250 mg | ORAL_TABLET | Freq: Every day | ORAL | Status: DC
  Administered 2015-05-08: 0.125 mg via ORAL
  Filled 2015-05-08: qty 1

## 2015-05-08 MED ORDER — POTASSIUM CHLORIDE CRYS ER 20 MEQ PO TBCR
40.0000 meq | EXTENDED_RELEASE_TABLET | Freq: Once | ORAL | Status: AC
  Administered 2015-05-09: 40 meq via ORAL
  Filled 2015-05-08: qty 2

## 2015-05-08 MED ORDER — VITAMIN K1 10 MG/ML IJ SOLN
1.0000 mg | Freq: Once | INTRAVENOUS | Status: AC
  Administered 2015-05-08: 1 mg via INTRAVENOUS
  Filled 2015-05-08: qty 0.1

## 2015-05-08 NOTE — Progress Notes (Addendum)
Patient Demographics:    [name] [name], is a 81 y.o. male, DOB - 1930/12/13, UMP:[id number]  Admit date - 05/07/2015   Admitting Physician Evalee Mutton Kristeen Mans, MD  Outpatient Primary MD for the patient is Nyoka Cowden, MD  LOS - 1   Chief Complaint  Patient presents with  . Shortness of Breath        Subjective:    [name] [name] today has, No headache, No chest pain, No abdominal pain - No Nausea, No new weakness tingling or numbness, No Cough - SOB.     Assessment  & Plan :     1. Exertional shortness of breath with orthopnea. Despite no leg edema he does have rales on exam and JVD, I suspect he is mildly volume overloaded, currently on Lasix twice a day and renal along with cardiology on board will defer management to them. Continue supportive care with oxygen and applies the treatments.    2. CAD with Acute on chronic systolic heart failure baseline EF 25% on echocardiogram done in March 2016. Continue diuresis as above, on hydralazine and beta blocker along with Imdur. Continue aspirin. Cardiology and renal on board. Ideology contemplating left heart cath on 05/09/2015 and will defer this to cardiology. He cannot be on ACE/ARB due to advanced renal failure.   3. Chronic kidney disease stage V. Baseline creatinine close to 4.5. Renal following, might progress to ESRD or at least require temporal dialysis if he goes on left heart cath. Explained clearly to the patient he accepts it. He would like to talk to cardiology again. Cardiology to follow. Renal on board.   4. Chronic atrial fibrillation. [place] vascular score of greater than 3. On Coumadin. Currently being held for possible left heart cath. Will resume once cleared by cardiology.   5.OSA. Nightly C PAP.     6.Essential  hypertension. Continue Imdur, hydralazine, diuretic and beta blocker.       Code Status : DNR  Family Communication  : None present  Disposition Plan  : Home in 2-3 days  Consults  :  Renal, cardiology  Procedures  :   DVT Prophylaxis  :  Coumadin - SCDs   Lab Results  Component Value Date   INR 2.71* 05/08/2015   INR 3.08* 05/07/2015   INR 1.8 04/16/2015     Lab Results  Component Value Date   PLT 104* 05/08/2015    Inpatient Medications  Scheduled Meds: . aspirin  81 mg Oral Daily  . calcitRIOL  0.25 mcg Oral Q M,W,F  . furosemide  160 mg Oral BID  . hydrALAZINE  37.5 mg Oral 3 times per day  . insulin aspart protamine- aspart  22 Units Subcutaneous BID WC  . isosorbide mononitrate  15 mg Oral Daily  . metoprolol succinate  100 mg Oral BID  . sodium chloride  3 mL Intravenous Q12H   Continuous Infusions:  PRN Meds:.acetaminophen **OR** acetaminophen, albuterol  Antibiotics  :   Anti-infectives    None        Objective:   Filed Vitals:   05/07/15 1436 05/07/15 2044 05/08/15 0402 05/08/15 0833  BP: 164/74 149/62 136/56 148/60  Pulse: 76 75 60 74  Temp:  97.9 F (36.6 C) 98.3 F (36.8  C) 98 F (36.7 C)  TempSrc:  Oral Oral Oral  Resp:  18 17 18   Height:      Weight:  78.835 kg (173 lb 12.8 oz)    SpO2:  93% 97% 97%    Wt Readings from Last 3 Encounters:  05/07/15 78.835 kg (173 lb 12.8 oz)  03/15/15 80.468 kg (177 lb 6.4 oz)  03/14/15 79.379 kg (175 lb)     Intake/Output Summary (Last 24 hours) at 05/08/15 1056 Last data filed at 05/08/15 0833  Gross per 24 hour  Intake    240 ml  Output      0 ml  Net    240 ml     Physical Exam  Awake Alert, Oriented X 3, No new F.N deficits, Normal affect [place].AT,PERRAL Supple Neck, mildly elevated JVD, No cervical lymphadenopathy appriciated.  Symmetrical Chest wall movement, Good air movement bilaterally, bibasilar rales RRR,No Gallops,Rubs or new Murmurs, No Parasternal Heave +ve  B.Sounds, Abd Soft, No tenderness, No organomegaly appriciated, No rebound - guarding or rigidity. No Cyanosis, Clubbing or edema, No new Rash or bruise       Data Review:   Micro Results No results found for this or any previous visit (from the past 240 hour(s)).  Radiology Reports Dg Chest 2 View  05/07/2015   CLINICAL DATA:  Shortness of breath for 2 days.  EXAM: CHEST  2 VIEW  COMPARISON:  12/05/2014  FINDINGS: The lungs are hyperinflated likely secondary to COPD. There is no focal parenchymal opacity. There is no pleural effusion or pneumothorax. The heart and mediastinal contours are unremarkable. There is a dual lead AICD.  The osseous structures are unremarkable.  IMPRESSION: No active cardiopulmonary disease.   Electronically Signed   By: Kathreen Devoid   On: 05/07/2015 10:08     CBC  Recent Labs Lab 05/07/15 0940 05/08/15 0535  WBC 7.7 5.9  HGB 12.6* 11.4*  HCT 38.1* 34.0*  PLT 132* 104*  MCV 97.2 95.8  MCH 32.1 32.1  MCHC 33.1 33.5  RDW 14.3 14.2  LYMPHSABS 1.4  --   MONOABS 0.5  --   EOSABS 0.1  --   BASOSABS 0.0  --     Chemistries   Recent Labs Lab 05/07/15 0940 05/08/15 0535  NA 142 139  K 4.1 3.3*  CL 106 104  CO2 21* 24  GLUCOSE 245* 202*  BUN 72* 67*  CREATININE 4.65* 4.56*  CALCIUM 9.7 8.9  AST 22  --   ALT 15*  --   ALKPHOS 27*  --   BILITOT 1.1  --    ------------------------------------------------------------------------------------------------------------------ estimated creatinine clearance is 13.3 mL/min (by C-G formula based on Cr of 4.56). ------------------------------------------------------------------------------------------------------------------ No results for input(s): HGBA1C in the last 72 hours. ------------------------------------------------------------------------------------------------------------------  Recent Labs  05/08/15 0535  CHOL 172  HDL 25*  LDLCALC 105*  TRIG 211*  CHOLHDL 6.9    ------------------------------------------------------------------------------------------------------------------ No results for input(s): TSH, T4TOTAL, T3FREE, THYROIDAB in the last 72 hours.  Invalid input(s): FREET3 ------------------------------------------------------------------------------------------------------------------ No results for input(s): VITAMINB12, FOLATE, FERRITIN, TIBC, IRON, RETICCTPCT in the last 72 hours.  Coagulation profile  Recent Labs Lab 05/07/15 0941 05/08/15 0535  INR 3.08* 2.71*    No results for input(s): DDIMER in the last 72 hours.  Cardiac Enzymes  Recent Labs Lab 05/07/15 1854 05/07/15 2341 05/08/15 0535  TROPONINI 0.09* 0.06* 0.08*   ------------------------------------------------------------------------------------------------------------------ Invalid input(s): POCBNP   Time Spent in minutes  35   Lala Lund K M.D on  05/08/2015 at 10:56 AM  Between 7am to 7pm - Pager - [phone]  After 7pm go to [email or web address] - password [facility]  [facility] -  Office  [phone]

## 2015-05-08 NOTE — Progress Notes (Signed)
Assessment/Plan: 1 CKD 5 at his baseline, steadily worsening, not a lot of obvious vol. Concern of ischemia.. Would support cath, and might need HD after. Will try to diurese on higher dose of furosemide. P HD if needed, ^ diuretics, discussed with Cards.  Subjective: Interval History: DOE  Objective: Vital signs in last 24 hours: Temp:  [97.8 F (36.6 C)-98.3 F (36.8 C)] 98.3 F (36.8 C) (08/16 2100) Pulse Rate:  [59-75] 59 (08/16 2100) Resp:  [17-18] 18 (08/16 2100) BP: (125-153)/(56-65) 125/57 mmHg (08/16 2100) SpO2:  [97 %-99 %] 98 % (08/16 2100) Weight:  [77.52 kg (170 lb 14.4 oz)] 77.52 kg (170 lb 14.4 oz) (08/16 1828) Weight change:   Intake/Output from previous day:   Intake/Output this shift:   Pleasant articulate man Lungs clear Cor RRR Abd soft Ext No edema  Lab Results:  Recent Labs  05/07/15 0940 05/08/15 0535  WBC 7.7 5.9  HGB 12.6* 11.4*  HCT 38.1* 34.0*  PLT 132* 104*   BMET:  Recent Labs  05/07/15 0940 05/08/15 0535  NA 142 139  K 4.1 3.3*  CL 106 104  CO2 21* 24  GLUCOSE 245* 202*  BUN 72* 67*  CREATININE 4.65* 4.56*  CALCIUM 9.7 8.9   No results for input(s): PTH in the last 72 hours. Iron Studies: No results for input(s): IRON, TIBC, TRANSFERRIN, FERRITIN in the last 72 hours. Studies/Results: Dg Chest 2 View  05/07/2015   CLINICAL DATA:  Shortness of breath for 2 days.  EXAM: CHEST  2 VIEW  COMPARISON:  12/05/2014  FINDINGS: The lungs are hyperinflated likely secondary to COPD. There is no focal parenchymal opacity. There is no pleural effusion or pneumothorax. The heart and mediastinal contours are unremarkable. There is a dual lead AICD.  The osseous structures are unremarkable.  IMPRESSION: No active cardiopulmonary disease.   Electronically Signed   By: Kathreen Devoid   On: 05/07/2015 10:08    Scheduled: . aspirin  81 mg Oral Daily  . calcitRIOL  0.25 mcg Oral Q M,W,F  . digoxin  0.125 mg Oral Daily  . furosemide  160 mg  Oral BID  . hydrALAZINE  37.5 mg Oral 3 times per day  . insulin aspart protamine- aspart  22 Units Subcutaneous BID WC  . isosorbide mononitrate  15 mg Oral Daily  . metoprolol succinate  100 mg Oral BID  . sodium chloride  3 mL Intravenous Q12H     LOS: 1 day   [name] [name] C 05/08/2015,10:26 PM

## 2015-05-08 NOTE — Progress Notes (Signed)
Patient is resting comfortably on his home CPAP and tolerating it well. RT will continue to monitor.

## 2015-05-08 NOTE — Progress Notes (Signed)
Utilization review completed. Noriel Guthrie, RN, BSN. 

## 2015-05-08 NOTE — Progress Notes (Signed)
Nutrition Brief Note  Patient identified on the Malnutrition Screening Tool (MST) Report  Wt Readings from Last 15 Encounters:  05/07/15 173 lb 12.8 oz (78.835 kg)  03/15/15 177 lb 6.4 oz (80.468 kg)  03/14/15 175 lb (79.379 kg)  02/26/15 175 lb (79.379 kg)  02/21/15 178 lb (80.74 kg)  01/29/15 181 lb (82.101 kg)  12/12/14 177 lb (80.287 kg)  12/07/14 168 lb (76.204 kg)  10/02/14 177 lb (80.287 kg)  09/25/14 175 lb 9.6 oz (79.652 kg)  09/20/14 180 lb 3.2 oz (81.738 kg)  09/12/14 181 lb (82.101 kg)  09/12/14 181 lb (82.101 kg)  07/13/14 181 lb (82.101 kg)  06/21/14 179 lb (81.194 kg)    Body mass index is 24.25 kg/(m^2). Patient meets criteria for normal based on current BMI.   Current diet order is renal/carb modified, patient is consuming approximately 100% of meals at this time. Pt reports eating good at meals with consumption of 3 meals daily. Labs and medications reviewed.   No nutrition interventions warranted at this time. If nutrition issues arise, please consult RD.   Corrin Parker, MS, RD, LDN Pager # [phone] After hours/ weekend pager # [phone]

## 2015-05-08 NOTE — Progress Notes (Signed)
Patient Name: [name] [name]. Date of Encounter: 05/08/2015  Primary Cardiologist: Dr. Burt Knack   Principal Problem:   Systolic CHF, acute on chronic Active Problems:   Essential hypertension   Coronary atherosclerosis   Chronic atrial fibrillation   Sleep apnea   BIVentricular Defibrillator--St Jude   Diabetes mellitus with renal complications   Acute renal failure superimposed on stage 4 chronic kidney disease   Elevated troponin    SUBJECTIVE  Denies any CP. States his SOB occurs with minimal exertion, however the PND was new.  CURRENT MEDS . aspirin  81 mg Oral Daily  . calcitRIOL  0.25 mcg Oral Q M,W,F  . furosemide  160 mg Oral BID  . hydrALAZINE  37.5 mg Oral 3 times per day  . insulin aspart protamine- aspart  22 Units Subcutaneous BID WC  . isosorbide mononitrate  15 mg Oral Daily  . metoprolol succinate  100 mg Oral BID  . sodium chloride  3 mL Intravenous Q12H    OBJECTIVE  Filed Vitals:   05/07/15 1436 05/07/15 2044 05/08/15 0402 05/08/15 0833  BP: 164/74 149/62 136/56 148/60  Pulse: 76 75 60 74  Temp:  97.9 F (36.6 C) 98.3 F (36.8 C) 98 F (36.7 C)  TempSrc:  Oral Oral Oral  Resp:  18 17 18   Height:      Weight:  173 lb 12.8 oz (78.835 kg)    SpO2:  93% 97% 97%    Intake/Output Summary (Last 24 hours) at 05/08/15 1048 Last data filed at 05/08/15 0833  Gross per 24 hour  Intake    240 ml  Output      0 ml  Net    240 ml   Filed Weights   05/07/15 0925 05/07/15 2044  Weight: 175 lb (79.379 kg) 173 lb 12.8 oz (78.835 kg)    PHYSICAL EXAM  General: Pleasant, NAD. Neuro: Alert and oriented X 3. Moves all extremities spontaneously. Psych: Normal affect. HEENT:  Normal  Neck: Supple without bruits or JVD. Lungs:  Resp regular and unlabored, CTA. Heart: RRR no s3, s4, or murmurs. Abdomen: Soft, non-tender, non-distended, BS + x 4.  Extremities: No clubbing, cyanosis or edema. DP/PT/Radials 2+ and equal bilaterally.  Accessory  Clinical Findings  CBC  Recent Labs  05/07/15 0940 05/08/15 0535  WBC 7.7 5.9  NEUTROABS 5.7  --   HGB 12.6* 11.4*  HCT 38.1* 34.0*  MCV 97.2 95.8  PLT 132* 185*   Basic Metabolic Panel  Recent Labs  05/07/15 0940 05/08/15 0535  NA 142 139  K 4.1 3.3*  CL 106 104  CO2 21* 24  GLUCOSE 245* 202*  BUN 72* 67*  CREATININE 4.65* 4.56*  CALCIUM 9.7 8.9  PHOS  --  4.3   Liver Function Tests  Recent Labs  05/07/15 0940 05/08/15 0535  AST 22  --   ALT 15*  --   ALKPHOS 27*  --   BILITOT 1.1  --   PROT 6.8  --   ALBUMIN 4.0 3.2*   Cardiac Enzymes  Recent Labs  05/07/15 1854 05/07/15 2341 05/08/15 0535  TROPONINI 0.09* 0.06* 0.08*   Fasting Lipid Panel  Recent Labs  05/08/15 0535  CHOL 172  HDL 25*  LDLCALC 105*  TRIG 211*  CHOLHDL 6.9    TELE Paced rhythm    ECG  Paced rhythm  Echocardiogram 12/06/2014  LV EF: 25% -  30%  ------------------------------------------------------------------- Indications:   CHF - 428.0.  -------------------------------------------------------------------  History:  PMH: Cardiomyopathy, ishcemic. Bi-ventricular defibrillator. Atrial fibrillation. Coronary artery disease. Risk factors: Hypertension. Diabetes mellitus. Dyslipidemia.  ------------------------------------------------------------------- Study Conclusions  - Left ventricle: The cavity size was mildly dilated. Wall thickness was increased in a pattern of mild LVH. Systolic function was severely reduced. The estimated ejection fraction was in the range of 25% to 30%. Diffuse hypokinesis. - Aortic valve: Sclerosis without stenosis. There was mild regurgitation. - Mitral valve: There was mild regurgitation. - Left atrium: The atrium was mildly dilated. - Right atrium: The atrium was mildly dilated. - Pulmonary arteries: PA peak pressure: 48 mm Hg (S).     Radiology/Studies  Dg Chest 2 View  05/07/2015   CLINICAL DATA:   Shortness of breath for 2 days.  EXAM: CHEST  2 VIEW  COMPARISON:  12/05/2014  FINDINGS: The lungs are hyperinflated likely secondary to COPD. There is no focal parenchymal opacity. There is no pleural effusion or pneumothorax. The heart and mediastinal contours are unremarkable. There is a dual lead AICD.  The osseous structures are unremarkable.  IMPRESSION: No active cardiopulmonary disease.   Electronically Signed   By: Kathreen Devoid   On: 05/07/2015 10:08    ASSESSMENT AND PLAN  1. PND and dyapnea on minimal exertion  - holding coumadin, plan for cath knowing he has high likelihood of HD afterward  - will arrange [employer] Jude to interrogate device today. Pt has been made DNR. There was question whether to stop the defibrillation therapy since he has been made DNR, will continue for now prior to cath  - Dr. Burt Knack will discuss with patient tomorrow AM regarding cath as well.   - continue ASA, lasix, Imdur, Metoprolol and hydralazine. Received a dose of Vit K last night, INR now 2.7. May need another dose tonight. Start IV heparin once INR <2.0. If does undergo cath, may benefit with combined L&R heart cath (which can help measure cardiac index and output).   2. Chronic systolic HF  3. Acute on chronic stage IV-V renal insufficiency  - has R brachial AVF placed, but never started on HD  - nephrology following.   4. CAD s/p BMS to LAD in 2009  5. Moderate pulm HTN by echo 11/2014  6. OSA compliant with CPAP  7. Chronic atrial fibrillation s/p AVN ablation and St Jude CRT-D  - on chronic coumadin which is currently on hold  Signed, Woodward Ku Pager: [phone]

## 2015-05-08 NOTE — Progress Notes (Signed)
Pharmacist Heart Failure Core Measure Documentation  Assessment: [name] [name]. has an EF documented as 25-30% on 12/06/14 by ECHO.  Rationale: Heart failure patients with left ventricular systolic dysfunction (LVSD) and an EF < 40% should be prescribed an angiotensin converting enzyme inhibitor (ACEI) or angiotensin receptor blocker (ARB) at discharge unless a contraindication is documented in the medical record.  This patient is not currently on an ACEI or ARB for HF.  This note is being placed in the record in order to provide documentation that a contraindication to the use of these agents is present for this encounter.  ACE Inhibitor or Angiotensin Receptor Blocker is contraindicated (specify all that apply)  []   ACEI allergy AND ARB allergy []   Angioedema []   Moderate or severe aortic stenosis []   Hyperkalemia []   Hypotension []   Renal artery stenosis [x]   Worsening renal function, preexisting renal disease or dysfunction   Lawson Radar 05/08/2015 3:58 PM

## 2015-05-08 NOTE — Progress Notes (Signed)
Patient requesting to speak to a chaplain today. Chaplain on call notified and stated they would come see patient.  Joellen Jersey, RN.

## 2015-05-09 ENCOUNTER — Encounter (HOSPITAL_COMMUNITY)
Admission: EM | Disposition: A | Discharge status: Home / Self Care | Payer: Medicare Other | Source: Home / Self Care | Attending: Family Medicine

## 2015-05-09 DIAGNOSIS — Z4502 Encounter for adjustment and management of automatic implantable cardiac defibrillator: Secondary | ICD-10-CM

## 2015-05-09 HISTORY — PX: CARDIAC CATHETERIZATION: SHX172

## 2015-05-09 LAB — BASIC METABOLIC PANEL
ANION GAP: 11 (ref 5–15)
BUN: 64 mg/dL — ABNORMAL HIGH (ref 6–20)
CHLORIDE: 103 mmol/L (ref 101–111)
CO2: 24 mmol/L (ref 22–32)
CREATININE: 4.66 mg/dL — AB (ref 0.61–1.24)
Calcium: 9 mg/dL (ref 8.9–10.3)
GFR calc non Af Amer: 11 mL/min — ABNORMAL LOW (ref >60)
GFR, EST AFRICAN AMERICAN: 12 mL/min — AB (ref >60)
Glucose, Bld: 105 mg/dL — ABNORMAL HIGH (ref 65–99)
POTASSIUM: 3 mmol/L — AB (ref 3.5–5.1)
Sodium: 138 mmol/L (ref 135–145)

## 2015-05-09 LAB — POCT I-STAT 3, VENOUS BLOOD GAS (G3P V)
Acid-base deficit: 2 mmol/L (ref 0.0–2.0)
Bicarbonate: 21.8 mEq/L (ref 20.0–24.0)
O2 SAT: 65 %
PCO2 VEN: 34.2 mmHg — AB (ref 45.0–50.0)
PH VEN: 7.413 — AB (ref 7.250–7.300)
TCO2: 23 mmol/L (ref 0–100)
pO2, Ven: 33 mmHg (ref 30.0–45.0)

## 2015-05-09 LAB — CBC
HEMATOCRIT: 36.1 % — AB (ref 39.0–52.0)
HEMOGLOBIN: 12.3 g/dL — AB (ref 13.0–17.0)
MCH: 32 pg (ref 26.0–34.0)
MCHC: 34.1 g/dL (ref 30.0–36.0)
MCV: 94 fL (ref 78.0–100.0)
Platelets: 119 10*3/uL — ABNORMAL LOW (ref 150–400)
RBC: 3.84 MIL/uL — AB (ref 4.22–5.81)
RDW: 14.1 % (ref 11.5–15.5)
WBC: 6.2 10*3/uL (ref 4.0–10.5)

## 2015-05-09 LAB — GLUCOSE, CAPILLARY
GLUCOSE-CAPILLARY: 114 mg/dL — AB (ref 65–99)
GLUCOSE-CAPILLARY: 135 mg/dL — AB (ref 65–99)
GLUCOSE-CAPILLARY: 227 mg/dL — AB (ref 65–99)
Glucose-Capillary: 116 mg/dL — ABNORMAL HIGH (ref 65–99)

## 2015-05-09 LAB — POCT I-STAT 3, ART BLOOD GAS (G3+)
ACID-BASE DEFICIT: 2 mmol/L (ref 0.0–2.0)
Bicarbonate: 22.4 mEq/L (ref 20.0–24.0)
O2 SAT: 95 %
PO2 ART: 75 mmHg — AB (ref 80.0–100.0)
TCO2: 23 mmol/L (ref 0–100)
pCO2 arterial: 35.4 mmHg (ref 35.0–45.0)
pH, Arterial: 7.408 (ref 7.350–7.450)

## 2015-05-09 LAB — PROTIME-INR
INR: 1.46 (ref 0.00–1.49)
Prothrombin Time: 17.8 seconds — ABNORMAL HIGH (ref 11.6–15.2)

## 2015-05-09 LAB — HEPATITIS B SURFACE ANTIGEN: Hepatitis B Surface Ag: NEGATIVE

## 2015-05-09 LAB — DIGOXIN LEVEL: Digoxin Level: 0.4 ng/mL — ABNORMAL LOW (ref 0.8–2.0)

## 2015-05-09 SURGERY — LEFT HEART CATH AND CORONARY ANGIOGRAPHY

## 2015-05-09 MED ORDER — DIGOXIN 0.0625 MG HALF TABLET
0.0625 mg | ORAL_TABLET | Freq: Every day | ORAL | Status: DC
  Administered 2015-05-09: 0.0625 mg via ORAL
  Filled 2015-05-09 (×3): qty 1

## 2015-05-09 MED ORDER — LIDOCAINE HCL (PF) 1 % IJ SOLN
INTRAMUSCULAR | Status: AC
  Filled 2015-05-09: qty 30

## 2015-05-09 MED ORDER — HYDRALAZINE HCL 20 MG/ML IJ SOLN
INTRAMUSCULAR | Status: AC
  Filled 2015-05-09: qty 1

## 2015-05-09 MED ORDER — SODIUM CHLORIDE 0.9 % IJ SOLN
3.0000 mL | Freq: Two times a day (BID) | INTRAMUSCULAR | Status: DC
  Administered 2015-05-09 – 2015-05-10 (×2): 3 mL via INTRAVENOUS

## 2015-05-09 MED ORDER — SODIUM CHLORIDE 0.9 % IV SOLN: 250.0000 mL | INTRAVENOUS | Status: DC | PRN

## 2015-05-09 MED ORDER — FUROSEMIDE 40 MG PO TABS: 80.0000 mg | ORAL_TABLET | Freq: Two times a day (BID) | ORAL | Status: DC

## 2015-05-09 MED ORDER — CLOPIDOGREL BISULFATE 300 MG PO TABS
300.0000 mg | ORAL_TABLET | Freq: Once | ORAL | Status: AC
  Administered 2015-05-09: 300 mg via ORAL
  Filled 2015-05-09: qty 1

## 2015-05-09 MED ORDER — MIDAZOLAM HCL 2 MG/2ML IJ SOLN
INTRAMUSCULAR | Status: DC | PRN
  Administered 2015-05-09: 1 mg via INTRAVENOUS

## 2015-05-09 MED ORDER — SODIUM CHLORIDE 0.9 % IV SOLN
INTRAVENOUS | Status: AC
  Administered 2015-05-09: 19:00:00 via INTRAVENOUS
  Administered 2015-05-09: 100 mL/h via INTRAVENOUS

## 2015-05-09 MED ORDER — POTASSIUM CHLORIDE CRYS ER 20 MEQ PO TBCR
40.0000 meq | EXTENDED_RELEASE_TABLET | Freq: Once | ORAL | Status: AC
  Administered 2015-05-09: 40 meq via ORAL
  Filled 2015-05-09: qty 2

## 2015-05-09 MED ORDER — ONDANSETRON HCL 4 MG/2ML IJ SOLN
4.0000 mg | Freq: Four times a day (QID) | INTRAMUSCULAR | Status: DC | PRN
Start: 2015-05-09 — End: 2015-05-10

## 2015-05-09 MED ORDER — LABETALOL HCL 5 MG/ML IV SOLN: 10.0000 mg | INTRAVENOUS | Status: DC | PRN

## 2015-05-09 MED ORDER — SODIUM CHLORIDE 0.9 % IJ SOLN
3.0000 mL | INTRAMUSCULAR | Status: DC | PRN
Start: 2015-05-09 — End: 2015-05-10

## 2015-05-09 MED ORDER — HEPARIN SODIUM (PORCINE) 5000 UNIT/ML IJ SOLN
5000.0000 [IU] | Freq: Three times a day (TID) | INTRAMUSCULAR | Status: DC
  Administered 2015-05-10: 07:00:00 5000 [IU] via SUBCUTANEOUS
  Filled 2015-05-09: qty 1

## 2015-05-09 MED ORDER — MIDAZOLAM HCL 2 MG/2ML IJ SOLN
INTRAMUSCULAR | Status: AC
  Filled 2015-05-09: qty 4

## 2015-05-09 MED ORDER — HEPARIN (PORCINE) IN NACL 2-0.9 UNIT/ML-% IJ SOLN
INTRAMUSCULAR | Status: AC
  Filled 2015-05-09: qty 2000

## 2015-05-09 MED ORDER — FENTANYL CITRATE (PF) 100 MCG/2ML IJ SOLN
INTRAMUSCULAR | Status: DC | PRN
  Administered 2015-05-09: 25 ug via INTRAVENOUS

## 2015-05-09 MED ORDER — IOHEXOL 350 MG/ML SOLN
INTRAVENOUS | Status: DC | PRN
  Administered 2015-05-09: 30 mL via INTRAVENOUS

## 2015-05-09 MED ORDER — HYDRALAZINE HCL 20 MG/ML IJ SOLN
10.0000 mg | Freq: Once | INTRAMUSCULAR | Status: AC
Start: 2015-05-09 — End: 2015-05-09
  Administered 2015-05-09: 10 mg via INTRAVENOUS

## 2015-05-09 MED ORDER — CLOPIDOGREL BISULFATE 75 MG PO TABS
75.0000 mg | ORAL_TABLET | Freq: Every day | ORAL | Status: DC
  Administered 2015-05-10: 09:00:00 75 mg via ORAL
  Filled 2015-05-09: qty 1

## 2015-05-09 MED ORDER — FENTANYL CITRATE (PF) 100 MCG/2ML IJ SOLN
INTRAMUSCULAR | Status: AC
  Filled 2015-05-09: qty 4

## 2015-05-09 MED ORDER — FUROSEMIDE 40 MG PO TABS
80.0000 mg | ORAL_TABLET | Freq: Two times a day (BID) | ORAL | Status: DC
  Administered 2015-05-10 (×2): 80 mg via ORAL
  Filled 2015-05-09 (×2): qty 2

## 2015-05-09 SURGICAL SUPPLY — 13 items
CATH INFINITI 5FR MULTPACK ANG (CATHETERS) ×3 IMPLANT
CATH SWAN GANZ 7F STRAIGHT (CATHETERS) ×3 IMPLANT
DEVICE RAD COMP TR BAND LRG (VASCULAR PRODUCTS) ×3 IMPLANT
GLIDESHEATH SLEND SS 6F .021 (SHEATH) ×3 IMPLANT
KIT HEART LEFT (KITS) ×3 IMPLANT
PACK CARDIAC CATHETERIZATION (CUSTOM PROCEDURE TRAY) ×3 IMPLANT
SHEATH PINNACLE 5F 10CM (SHEATH) ×3 IMPLANT
SHEATH PINNACLE 7F 10CM (SHEATH) ×3 IMPLANT
SYR MEDRAD MARK V 150ML (SYRINGE) ×3 IMPLANT
TRANSDUCER W/STOPCOCK (MISCELLANEOUS) ×6 IMPLANT
TUBING CIL FLEX 10 FLL-RA (TUBING) ×3 IMPLANT
WIRE EMERALD 3MM-J .035X150CM (WIRE) ×3 IMPLANT
WIRE SAFE-T 1.5MM-J .035X260CM (WIRE) ×3 IMPLANT

## 2015-05-09 NOTE — Progress Notes (Signed)
Patient Demographics:    [name] [name], is a 81 y.o. male, DOB - Sep 02, 1931, MOQ:[id number]  Admit date - 05/07/2015   Admitting Physician Evalee Mutton Kristeen Mans, MD  Outpatient Primary MD for the patient is Nyoka Cowden, MD  LOS - 2   Chief Complaint  Patient presents with  . Shortness of Breath        Subjective:    [name] [name] resports no new complaints. Still sob at times.   Assessment  & Plan :     1. Exertional shortness of breath with orthopnea - Continue supportive care with oxygen and applies the treatments.  - Cardiology on board.  2. CAD with Acute on chronic systolic heart failure baseline EF 25% on echocardiogram done in March 2016. Continue diuresis as above, on hydralazine and beta blocker along with Imdur. Continue aspirin.  - Cardiology and renal on board.  - s/p left heart cath on 05/09/2015  - He cannot be on ACE/ARB due to advanced renal failure.   3. Chronic kidney disease stage V. Baseline creatinine close to 4.5. Renal following, might progress to ESRD or at least require temporal dialysis if he goes on left heart cath. This was explained to the patient.    4. Chronic atrial fibrillation. [place] vascular score of greater than 3. On Coumadin. Currently being held for possible left heart cath. Will resume once cleared by cardiology.   5.OSA. Nightly C PAP.     6.Essential hypertension. Continue Imdur, hydralazine, diuretic and beta blocker.       Code Status : DNR  Family Communication  : None present  Disposition Plan  : Home in 2-3 days  Consults  :  Renal, cardiology  Procedures  :   DVT Prophylaxis  :  Coumadin - SCDs   Lab Results  Component Value Date   INR 1.46 05/09/2015   INR 2.71* 05/08/2015   INR 3.08* 05/07/2015     Lab  Results  Component Value Date   PLT 119* 05/09/2015    Inpatient Medications  Scheduled Meds: . [MAR Hold] aspirin  81 mg Oral Daily  . [MAR Hold] calcitRIOL  0.25 mcg Oral Q M,W,F  . [MAR Hold] digoxin  0.0625 mg Oral Daily  . [MAR Hold] furosemide  80 mg Oral BID  . [MAR Hold] hydrALAZINE  37.5 mg Oral 3 times per day  . [MAR Hold] insulin aspart protamine- aspart  22 Units Subcutaneous BID WC  . [MAR Hold] isosorbide mononitrate  15 mg Oral Daily  . [MAR Hold] metoprolol succinate  100 mg Oral BID  . [MAR Hold] sodium chloride  3 mL Intravenous Q12H   Continuous Infusions: . sodium chloride     PRN Meds:.[MAR Hold] acetaminophen **OR** [MAR Hold] acetaminophen, [MAR Hold] albuterol  Antibiotics  :   Anti-infectives    None        Objective:   Filed Vitals:   05/09/15 1730 05/09/15 1735 05/09/15 1740 05/09/15 1745  BP: 186/85 178/69 173/69 180/102  Pulse: 75 74 76 74  Temp:      TempSrc:      Resp: 20 21 18 19   Height:      Weight:      SpO2: 98% 99% 98% 98%  Wt Readings from Last 3 Encounters:  05/08/15 77.52 kg (170 lb 14.4 oz)  03/15/15 80.468 kg (177 lb 6.4 oz)  03/14/15 79.379 kg (175 lb)     Intake/Output Summary (Last 24 hours) at 05/09/15 1751 Last data filed at 05/09/15 0923  Gross per 24 hour  Intake    180 ml  Output    775 ml  Net   -595 ml     Physical Exam  Awake Alert, Oriented X 3, No new F.N deficits, Normal affect [place].AT,PERRAL Supple Neck, mildly elevated JVD, No cervical lymphadenopathy appriciated.  Symmetrical Chest wall movement, Good air movement bilaterally, bibasilar rales RRR,No Gallops,Rubs or new Murmurs, No Parasternal Heave +ve B.Sounds, Abd Soft, No tenderness, No organomegaly appriciated, No rebound - guarding or rigidity. No Cyanosis, Clubbing or edema, No new Rash or bruise       Data Review:   Micro Results No results found for this or any previous visit (from the past 240 hour(s)).  Radiology  Reports Dg Chest 2 View  05/07/2015   CLINICAL DATA:  Shortness of breath for 2 days.  EXAM: CHEST  2 VIEW  COMPARISON:  12/05/2014  FINDINGS: The lungs are hyperinflated likely secondary to COPD. There is no focal parenchymal opacity. There is no pleural effusion or pneumothorax. The heart and mediastinal contours are unremarkable. There is a dual lead AICD.  The osseous structures are unremarkable.  IMPRESSION: No active cardiopulmonary disease.   Electronically Signed   By: Kathreen Devoid   On: 05/07/2015 10:08     CBC  Recent Labs Lab 05/07/15 0940 05/08/15 0535 05/09/15 0453  WBC 7.7 5.9 6.2  HGB 12.6* 11.4* 12.3*  HCT 38.1* 34.0* 36.1*  PLT 132* 104* 119*  MCV 97.2 95.8 94.0  MCH 32.1 32.1 32.0  MCHC 33.1 33.5 34.1  RDW 14.3 14.2 14.1  LYMPHSABS 1.4  --   --   MONOABS 0.5  --   --   EOSABS 0.1  --   --   BASOSABS 0.0  --   --     Chemistries   Recent Labs Lab 05/07/15 0940 05/08/15 0535 05/09/15 0453  NA 142 139 138  K 4.1 3.3* 3.0*  CL 106 104 103  CO2 21* 24 24  GLUCOSE 245* 202* 105*  BUN 72* 67* 64*  CREATININE 4.65* 4.56* 4.66*  CALCIUM 9.7 8.9 9.0  AST 22  --   --   ALT 15*  --   --   ALKPHOS 27*  --   --   BILITOT 1.1  --   --    ------------------------------------------------------------------------------------------------------------------ estimated creatinine clearance is 13 mL/min (by C-G formula based on Cr of 4.66). ------------------------------------------------------------------------------------------------------------------ No results for input(s): HGBA1C in the last 72 hours. ------------------------------------------------------------------------------------------------------------------  Recent Labs  05/08/15 0535  CHOL 172  HDL 25*  LDLCALC 105*  TRIG 211*  CHOLHDL 6.9   ------------------------------------------------------------------------------------------------------------------ No results for input(s): TSH, T4TOTAL,  T3FREE, THYROIDAB in the last 72 hours.  Invalid input(s): FREET3 ------------------------------------------------------------------------------------------------------------------ No results for input(s): VITAMINB12, FOLATE, FERRITIN, TIBC, IRON, RETICCTPCT in the last 72 hours.  Coagulation profile  Recent Labs Lab 05/07/15 0941 05/08/15 0535 05/09/15 0453  INR 3.08* 2.71* 1.46    No results for input(s): DDIMER in the last 72 hours.  Cardiac Enzymes  Recent Labs Lab 05/07/15 1854 05/07/15 2341 05/08/15 0535  TROPONINI 0.09* 0.06* 0.08*   ------------------------------------------------------------------------------------------------------------------ Invalid input(s): POCBNP   Time Spent in minutes  35   Velvet Bathe M.D  on 05/09/2015 at 5:51 PM  Between 7am to 7pm - Pager - [phone]  After 7pm go to [email or web address] - password [facility]  [facility] -  Office  [phone]

## 2015-05-09 NOTE — Progress Notes (Signed)
       Patient Name: [name] [name]. Date of Encounter: 05/09/2015    SUBJECTIVE: The patient denies chest discomfort. He is still dyspneic but improved. Dr. Antionette Char aware of the patient's presence. He will speak with the patient prior to proceeding with cath.  TELEMETRY:  Paced rhythm, AV sequential with occasional atrial tracking Filed Vitals:   05/08/15 1646 05/08/15 1828 05/08/15 2100 05/09/15 0500  BP: 153/65  125/57 139/63  Pulse: 75  59 59  Temp: 97.8 F (36.6 C)  98.3 F (36.8 C) 98.1 F (36.7 C)  TempSrc: Oral  Oral Oral  Resp: 18  18 16   Height:      Weight:  77.52 kg (170 lb 14.4 oz)    SpO2: 99%  98% 98%    Intake/Output Summary (Last 24 hours) at 05/09/15 1134 Last data filed at 05/09/15 0923  Gross per 24 hour  Intake    600 ml  Output   1025 ml  Net   -425 ml   LABS: Basic Metabolic Panel:  Recent Labs  05/08/15 0535 05/09/15 0453  NA 139 138  K 3.3* 3.0*  CL 104 103  CO2 24 24  GLUCOSE 202* 105*  BUN 67* 64*  CREATININE 4.56* 4.66*  CALCIUM 8.9 9.0  PHOS 4.3  --    CBC:  Recent Labs  05/07/15 0940 05/08/15 0535 05/09/15 0453  WBC 7.7 5.9 6.2  NEUTROABS 5.7  --   --   HGB 12.6* 11.4* 12.3*  HCT 38.1* 34.0* 36.1*  MCV 97.2 95.8 94.0  PLT 132* 104* 119*   Cardiac Enzymes:  Recent Labs  05/07/15 1854 05/07/15 2341 05/08/15 0535  TROPONINI 0.09* 0.06* 0.08*   Fasting Lipid Panel:  Recent Labs  05/08/15 0535  CHOL 172  HDL 25*  LDLCALC 105*  TRIG 211*  CHOLHDL 6.9   INR: Normalized at 1.46  Radiology/Studies:  No new data  Physical Exam: Blood pressure 139/63, pulse 59, temperature 98.1 F (36.7 C), temperature source Oral, resp. rate 16, height 5\' 11"  (1.803 m), weight 77.52 kg (170 lb 14.4 oz), SpO2 98 %. Weight change: -1.86 kg (-4 lb 1.6 oz)  Wt Readings from Last 3 Encounters:  05/08/15 77.52 kg (170 lb 14.4 oz)  03/15/15 80.468 kg (177 lb 6.4 oz)  03/14/15 79.379 kg (175 lb)   Frail appearing elderly  gentleman Moderate JVD Soft S3 gallop No peripheral edema Neurologically intact  ASSESSMENT:  1. Acute on chronic systolic heart failure, class III-IV 2. Coronary atherosclerosis without obvious angina. Rule out decompensation and heart failure related to ischemia. Mildly elevated troponin is likely related to heart failure. 3. Biventricular resynchronization. He was re-interrogated yesterday, results aren't currently available. 4. Stage V CKD, with risk of dialysis dependency with contrast exposure. This is been discussed in detail with the patient. Has been seen by nephrology, Dr. Florene Glen this morning.  Plan:  Proceed with planned diagnostic catheterization today. Assuming left and right heart catheterization but will leave to Dr. Burt Knack. Follow renal function closely after procedure to determine the need for dialysis.  [name] [name] 05/09/2015, 11:34 AM

## 2015-05-09 NOTE — H&P (View-Only) (Signed)
       Patient Name: [name] [name]. Date of Encounter: 05/09/2015    SUBJECTIVE: The patient denies chest discomfort. He is still dyspneic but improved. Dr. Antionette Char aware of the patient's presence. He will speak with the patient prior to proceeding with cath.  TELEMETRY:  Paced rhythm, AV sequential with occasional atrial tracking Filed Vitals:   05/08/15 1646 05/08/15 1828 05/08/15 2100 05/09/15 0500  BP: 153/65  125/57 139/63  Pulse: 75  59 59  Temp: 97.8 F (36.6 C)  98.3 F (36.8 C) 98.1 F (36.7 C)  TempSrc: Oral  Oral Oral  Resp: 18  18 16   Height:      Weight:  77.52 kg (170 lb 14.4 oz)    SpO2: 99%  98% 98%    Intake/Output Summary (Last 24 hours) at 05/09/15 1134 Last data filed at 05/09/15 0923  Gross per 24 hour  Intake    600 ml  Output   1025 ml  Net   -425 ml   LABS: Basic Metabolic Panel:  Recent Labs  05/08/15 0535 05/09/15 0453  NA 139 138  K 3.3* 3.0*  CL 104 103  CO2 24 24  GLUCOSE 202* 105*  BUN 67* 64*  CREATININE 4.56* 4.66*  CALCIUM 8.9 9.0  PHOS 4.3  --    CBC:  Recent Labs  05/07/15 0940 05/08/15 0535 05/09/15 0453  WBC 7.7 5.9 6.2  NEUTROABS 5.7  --   --   HGB 12.6* 11.4* 12.3*  HCT 38.1* 34.0* 36.1*  MCV 97.2 95.8 94.0  PLT 132* 104* 119*   Cardiac Enzymes:  Recent Labs  05/07/15 1854 05/07/15 2341 05/08/15 0535  TROPONINI 0.09* 0.06* 0.08*   Fasting Lipid Panel:  Recent Labs  05/08/15 0535  CHOL 172  HDL 25*  LDLCALC 105*  TRIG 211*  CHOLHDL 6.9   INR: Normalized at 1.46  Radiology/Studies:  No new data  Physical Exam: Blood pressure 139/63, pulse 59, temperature 98.1 F (36.7 C), temperature source Oral, resp. rate 16, height 5\' 11"  (1.803 m), weight 77.52 kg (170 lb 14.4 oz), SpO2 98 %. Weight change: -1.86 kg (-4 lb 1.6 oz)  Wt Readings from Last 3 Encounters:  05/08/15 77.52 kg (170 lb 14.4 oz)  03/15/15 80.468 kg (177 lb 6.4 oz)  03/14/15 79.379 kg (175 lb)   Frail appearing elderly  gentleman Moderate JVD Soft S3 gallop No peripheral edema Neurologically intact  ASSESSMENT:  1. Acute on chronic systolic heart failure, class III-IV 2. Coronary atherosclerosis without obvious angina. Rule out decompensation and heart failure related to ischemia. Mildly elevated troponin is likely related to heart failure. 3. Biventricular resynchronization. He was re-interrogated yesterday, results aren't currently available. 4. Stage V CKD, with risk of dialysis dependency with contrast exposure. This is been discussed in detail with the patient. Has been seen by nephrology, Dr. Florene Glen this morning.  Plan:  Proceed with planned diagnostic catheterization today. Assuming left and right heart catheterization but will leave to Dr. Burt Knack. Follow renal function closely after procedure to determine the need for dialysis.  Demetrios Isaacs 05/09/2015, 11:34 AM

## 2015-05-09 NOTE — Progress Notes (Signed)
  Assessment/Plan: 1 CKD 5, slightly above baseline. Concern of myocardial ischemia.. Would support cath, and might need HD after.  P follow and HD if needed, K replace on dig, reduce furosemide to 80Mg  BID     Subjective: Interval History: none.  Objective: Vital signs in last 24 hours: Temp:  [97.8 F (36.6 C)-98.3 F (36.8 C)] 98.1 F (36.7 C) (08/17 0500) Pulse Rate:  [59-75] 59 (08/17 0500) Resp:  [16-18] 16 (08/17 0500) BP: (125-153)/(57-65) 139/63 mmHg (08/17 0500) SpO2:  [98 %-99 %] 98 % (08/17 0500) Weight:  [77.52 kg (170 lb 14.4 oz)] 77.52 kg (170 lb 14.4 oz) (08/16 1828) Weight change: -1.86 kg (-4 lb 1.6 oz)  Intake/Output from previous day: 08/16 0701 - 08/17 0700 In: 900 [P.O.:900] Out: 775 [Urine:775] Intake/Output this shift: Total I/O In: 0  Out: 250 [Urine:250]  General appearance: alert and cooperative Resp: clear to auscultation bilaterally and sl decreased BS Cardio: regular rate and rhythm, S1, S2 normal, no murmur, click, rub or gallop Extremities: extremities normal, atraumatic, no cyanosis or edema  Lab Results:  Recent Labs  05/08/15 0535 05/09/15 0453  WBC 5.9 6.2  HGB 11.4* 12.3*  HCT 34.0* 36.1*  PLT 104* 119*   BMET:  Recent Labs  05/08/15 0535 05/09/15 0453  NA 139 138  K 3.3* 3.0*  CL 104 103  CO2 24 24  GLUCOSE 202* 105*  BUN 67* 64*  CREATININE 4.56* 4.66*  CALCIUM 8.9 9.0   No results for input(s): PTH in the last 72 hours. Iron Studies: No results for input(s): IRON, TIBC, TRANSFERRIN, FERRITIN in the last 72 hours. Studies/Results: No results found.  Scheduled: . aspirin  81 mg Oral Daily  . calcitRIOL  0.25 mcg Oral Q M,W,F  . digoxin  0.0625 mg Oral Daily  . furosemide  160 mg Oral BID  . hydrALAZINE  37.5 mg Oral 3 times per day  . insulin aspart protamine- aspart  22 Units Subcutaneous BID WC  . isosorbide mononitrate  15 mg Oral Daily  . metoprolol succinate  100 mg Oral BID  . sodium chloride  3  mL Intravenous Q12H      LOS: 2 days   Yuleni Burich C 05/09/2015,3:23 PM

## 2015-05-09 NOTE — Progress Notes (Signed)
Site area: Rt fem art and rt fem vein Site Prior to Removal:  Level 0 Pressure Applied For: 2min Manual:   yes Patient Status During Pull:  A/o Post Pull Site:  Level 0  Post Pull Instructions Given:  Yes and pt understands Post Pull Pulses Present: rt dp/pt Dressing Applied:  tegaderm and 4x4 Bedrest begins @ 17:50:00 Comments:Pt leaves ha in stable condition. Rt groin unremarkable and dressing is CDI.

## 2015-05-09 NOTE — Progress Notes (Signed)
Chaplain responded to a consult for prayer. Pt said he had been visited by Chaplain a day before. Chaplain asked if Pt had any additional needs. Pt said no. Chaplain prayed with Pt and let Pt know they are available for any spiritual needs.   05/09/15 1000  Clinical Encounter Type  Visited With Patient  Visit Type Spiritual support  Referral From Nurse  Spiritual Encounters  Spiritual Needs Prayer

## 2015-05-09 NOTE — Interval H&P Note (Signed)
History and Physical Interval Note:  05/09/2015 4:07 PM  [name]  has presented today for surgery, with the diagnosis of cp  The various methods of treatment have been discussed with the patient and family. After consideration of risks, benefits and other options for treatment, the patient has consented to  Procedure(s): Left Heart Cath and Coronary Angiography (N/A) as a surgical intervention .  The patient's history has been reviewed, patient examined, no change in status, stable for surgery.  I have reviewed the patient's chart and labs.  Questions were answered to the patient's satisfaction.     Sherren Mocha

## 2015-05-09 NOTE — Progress Notes (Signed)
Patient is resting comfortably on his home CPAP and tolerating it well. RT will continue to monitor as needed.

## 2015-05-10 ENCOUNTER — Encounter (HOSPITAL_COMMUNITY): Payer: Self-pay | Admitting: Cardiovascular Disease

## 2015-05-10 ENCOUNTER — Other Ambulatory Visit: Payer: Self-pay | Admitting: Student

## 2015-05-10 DIAGNOSIS — N184 Chronic kidney disease, stage 4 (severe): Secondary | ICD-10-CM

## 2015-05-10 LAB — GLUCOSE, CAPILLARY
GLUCOSE-CAPILLARY: 180 mg/dL — AB (ref 65–99)
Glucose-Capillary: 177 mg/dL — ABNORMAL HIGH (ref 65–99)
Glucose-Capillary: 130 mg/dL — ABNORMAL HIGH (ref 65–99)

## 2015-05-10 LAB — BASIC METABOLIC PANEL WITH GFR
Anion gap: 9 (ref 5–15)
BUN: 60 mg/dL — ABNORMAL HIGH (ref 6–20)
CO2: 23 mmol/L (ref 22–32)
Calcium: 9.2 mg/dL (ref 8.9–10.3)
Chloride: 108 mmol/L (ref 101–111)
Creatinine, Ser: 4.55 mg/dL — ABNORMAL HIGH (ref 0.61–1.24)
GFR calc Af Amer: 13 mL/min — ABNORMAL LOW
GFR calc non Af Amer: 11 mL/min — ABNORMAL LOW
Glucose, Bld: 191 mg/dL — ABNORMAL HIGH (ref 65–99)
Potassium: 3.2 mmol/L — ABNORMAL LOW (ref 3.5–5.1)
Sodium: 140 mmol/L (ref 135–145)

## 2015-05-10 LAB — PROTIME-INR
INR: 1.42 (ref 0.00–1.49)
PROTHROMBIN TIME: 17.4 s — AB (ref 11.6–15.2)

## 2015-05-10 MED ORDER — INSULIN LISPRO PROT & LISPRO (75-25 MIX) 100 UNIT/ML ~~LOC~~ SUSP
22.0000 [IU] | Freq: Two times a day (BID) | SUBCUTANEOUS | Status: DC
Start: 2015-05-10 — End: 2015-06-06

## 2015-05-10 MED ORDER — CLOPIDOGREL BISULFATE 75 MG PO TABS: 75.0000 mg | ORAL_TABLET | Freq: Every day | ORAL | Status: DC

## 2015-05-10 MED ORDER — METOPROLOL SUCCINATE ER 100 MG PO TB24: 100.0000 mg | ORAL_TABLET | Freq: Two times a day (BID) | ORAL | Status: DC

## 2015-05-10 MED ORDER — POTASSIUM CHLORIDE CRYS ER 20 MEQ PO TBCR: 20.0000 meq | EXTENDED_RELEASE_TABLET | Freq: Every day | ORAL | Status: DC

## 2015-05-10 MED FILL — Heparin Sodium (Porcine) 2 Unit/ML in Sodium Chloride 0.9%: INTRAMUSCULAR | Qty: 2000 | Status: AC

## 2015-05-10 MED FILL — Lidocaine HCl Local Preservative Free (PF) Inj 1%: INTRAMUSCULAR | Qty: 30 | Status: AC

## 2015-05-10 NOTE — Progress Notes (Signed)
CKD, stable.  Rec: F/U lab s in AM.  Pt will also f/u with Dr. Lorrene Reid, [place] for D/C  Objective: Vital signs in last 24 hours: Temp:  [97.6 F (36.4 C)-97.9 F (36.6 C)] 97.7 F (36.5 C) (08/18 0828) Pulse Rate:  [0-133] 76 (08/18 0828) Resp:  [0-44] 20 (08/18 0828) BP: (137-195)/(47-102) 188/75 mmHg (08/18 0828) SpO2:  [0 %-100 %] 99 % (08/18 0828) Weight:  [76.3 kg (168 lb 3.4 oz)] 76.3 kg (168 lb 3.4 oz) (08/18 0405) Weight change: -1.22 kg (-2 lb 11 oz)  Intake/Output from previous day: 08/17 0701 - 08/18 0700 In: 953.3 [P.O.:120; I.V.:833.3] Out: 950 [Urine:950] Intake/Output this shift: Total I/O In: 240 [P.O.:240] Out: 200 [Urine:200]  No change in exam Lab Results:  Recent Labs  05/08/15 0535 05/09/15 0453  WBC 5.9 6.2  HGB 11.4* 12.3*  HCT 34.0* 36.1*  PLT 104* 119*   BMET:  Recent Labs  05/09/15 0453 05/10/15 1040  NA 138 140  K 3.0* 3.2*  CL 103 108  CO2 24 23  GLUCOSE 105* 191*  BUN 64* 60*  CREATININE 4.66* 4.55*  CALCIUM 9.0 9.2   No results for input(s): PTH in the last 72 hours. Iron Studies: No results for input(s): IRON, TIBC, TRANSFERRIN, FERRITIN in the last 72 hours. Studies/Results: No results found.  Scheduled: . aspirin  81 mg Oral Daily  . calcitRIOL  0.25 mcg Oral Q M,W,F  . clopidogrel  75 mg Oral Daily  . digoxin  0.0625 mg Oral Daily  . furosemide  80 mg Oral BID  . heparin  5,000 Units Subcutaneous 3 times per day  . hydrALAZINE  37.5 mg Oral 3 times per day  . insulin aspart protamine- aspart  22 Units Subcutaneous BID WC  . isosorbide mononitrate  15 mg Oral Daily  . metoprolol succinate  100 mg Oral BID  . sodium chloride  3 mL Intravenous Q12H     LOS: 3 days   Maddyx Wieck C 05/10/2015,1:20 PM

## 2015-05-10 NOTE — Progress Notes (Signed)
       Patient Name: [name] [name]. Date of Encounter: 05/10/2015    SUBJECTIVE: Still complains of dyspnea. No chest discomfort.  TELEMETRY:  Paced rhythm Filed Vitals:   05/09/15 2100 05/10/15 0115 05/10/15 0405 05/10/15 0828  BP: 137/47 149/60 162/67 188/75  Pulse:   75 76  Temp:   97.6 F (36.4 C) 97.7 F (36.5 C)  TempSrc:   Axillary Oral  Resp: 19  20 20   Height:      Weight:   76.3 kg (168 lb 3.4 oz)   SpO2:   98% 99%    Intake/Output Summary (Last 24 hours) at 05/10/15 0928 Last data filed at 05/10/15 0830  Gross per 24 hour  Intake 1193.33 ml  Output    900 ml  Net 293.33 ml   LABS: Basic Metabolic Panel:  Recent Labs  05/08/15 0535 05/09/15 0453  NA 139 138  K 3.3* 3.0*  CL 104 103  CO2 24 24  GLUCOSE 202* 105*  BUN 67* 64*  CREATININE 4.56* 4.66*  CALCIUM 8.9 9.0  PHOS 4.3  --    CBC:  Recent Labs  05/07/15 0940 05/08/15 0535 05/09/15 0453  WBC 7.7 5.9 6.2  NEUTROABS 5.7  --   --   HGB 12.6* 11.4* 12.3*  HCT 38.1* 34.0* 36.1*  MCV 97.2 95.8 94.0  PLT 132* 104* 119*   Cardiac Enzymes:  Recent Labs  05/07/15 1854 05/07/15 2341 05/08/15 0535  TROPONINI 0.09* 0.06* 0.08*   BNP: Invalid input(s): POCBNP Hemoglobin A1C: No results for input(s): HGBA1C in the last 72 hours. Fasting Lipid Panel:  Recent Labs  05/08/15 0535  CHOL 172  HDL 25*  LDLCALC 105*  TRIG 211*  CHOLHDL 6.9    Radiology/Studies:  No new data  Physical Exam: Blood pressure 188/75, pulse 76, temperature 97.7 F (36.5 C), temperature source Oral, resp. rate 20, height 5\' 11"  (1.803 m), weight 76.3 kg (168 lb 3.4 oz), SpO2 99 %. Weight change: -1.22 kg (-2 lb 11 oz)  Wt Readings from Last 3 Encounters:  05/10/15 76.3 kg (168 lb 3.4 oz)  03/15/15 80.468 kg (177 lb 6.4 oz)  03/14/15 79.379 kg (175 lb)    Right femoral cath site is unremarkable. Chest is clear. Cardiac exam is unremarkable. Extremities reveal no edema. Femoral site as noted  above.     ASSESSMENT:  1. Moderately severe proximal LAD, to have PCI performed as a staged procedure because of renal impairment. We will need to arrange PCI for 7 days prior to this discharge.   2. Stage V chronic kidney disease, still awaiting a.m. laboratory data. 3. Chronic systolic heart failure with exertional dyspnea, but no active evidence of volume overload.  Plan:  Basic metabolic panel is ordered. If there is not a dramatic rise in the creatinine this morning, he can be discharged with a follow-up basic metabolic panel in a.m. as an outpatient.   Prior to discharge we must arrange for staged PCI.  [name] [name] 05/10/2015, 9:28 AM

## 2015-05-10 NOTE — Care Management Important Message (Signed)
Important Message  Patient Details  Name: [name] [name]. MRN: [id number] Date of Birth: 1931-08-10   Medicare Important Message Given:  Yes-second notification given    Delorse Lek 05/10/2015, 2:23 PM

## 2015-05-10 NOTE — Discharge Summary (Signed)
Physician Discharge Summary  [name] [name]. JME:[id number] DOB: 1931/08/17 DOA: 05/07/2015  PCP: Nyoka Cowden, MD  Admit date: 05/07/2015 Discharge date: 05/10/2015  Time spent: > 35 minutes  Recommendations for Outpatient Follow-up:  1. Pt will need a BMP on 05/11/15.  2. Also is scheduled to f/u with cardiology the Wednesday after d/c 3. Monitor K levels 4. Warfarin to be held until after the procedure by cardiology  Discharge Diagnoses:  Principal Problem:   Systolic CHF, acute on chronic Active Problems:   Essential hypertension   Coronary atherosclerosis   Chronic atrial fibrillation   Sleep apnea   BIVentricular Defibrillator--St [employer]   Diabetes mellitus with renal complications   Acute renal failure superimposed on stage 4 chronic kidney disease   Elevated troponin   Discharge Condition: stable  Diet recommendation: Renal/carb modified.  Filed Weights   05/07/15 2044 05/08/15 1828 05/10/15 0405  Weight: 78.835 kg (173 lb 12.8 oz) 77.52 kg (170 lb 14.4 oz) 76.3 kg (168 lb 3.4 oz)    History of present illness:  According to Cardiology's Consult note:  81 y/o M with history of CAD s/p BMS to LAD 2009, chronic atrial fibrillation s/p AVN ablation and CRT-D implantation, chronic systolic CHF (felt out of proportion to CAD thus mixed NICM), CKD stage IV (AV fistula placed 03/2012), HTN, HLD, DM, secondary hyperparathyroidism, pulm HTN with elevated PASP by prior echo, OSA on CPAP, colon CA who presented to [facility] with dyspnea and malaise. Last cath 2010 with continued patency of LAD, otherwise scattered noncritical disease. Last echo 11/2014 mild LVH, EF 25-30%, diffuse HK, mild AR, mild MR, PASP 76mmHg  Hospital Course:  1. Exertional shortness of breath with orthopnea - Cardiology on board while patient was in house and plans are for PCI on Wednesday 8/24 as outpatient  2. CAD with Acute on chronic  systolic heart failure baseline EF 25% on echocardiogram done in March 2016. Continue diuresis as above, on hydralazine and beta blocker along with Imdur. Continue aspirin.  - He cannot be on ACE/ARB due to advanced renal failure. - Awaiting further work up. Placed on plavix and aspirin   3. Chronic kidney disease stage V. Baseline creatinine close to 4.5. Renal followed while patient in house - plans are for BMP next am after d/c will have secretary set up appointment with nephrologist.   4. Chronic atrial fibrillation. [place] vascular score of greater than 3. On Coumadin. Currently being held for  heart cath. Cardiology to resume after procedure   5.OSA. Nightly C PAP.    Consultations:  Cardiology  Nephrology  Discharge Exam: Filed Vitals:   05/10/15 1251  BP: 157/70  Pulse: 75  Temp: 98 F (36.7 C)  Resp:     General: Pt in nad, alert and awake Cardiovascular: s1 and s2 wnl, no rubs Respiratory: no increased wob, no wheezes  Discharge Instructions   Discharge Instructions    Call MD for:  difficulty breathing, headache or visual disturbances    Complete by:  As directed      Call MD for:  severe uncontrolled pain    Complete by:  As directed      Call MD for:  temperature >100.4    Complete by:  As directed      Diet - low sodium heart healthy    Complete by:  As directed      Discharge instructions    Complete by:  As directed  You will need a lab test obtained tomorrow as outpatient with your nephrologist group. The lab test is a BMP. Please obtain tomorrow 05/11/15 for further evaluation and recommendations from your nephrologist.  Also you are to follow up with your Cardiologist this Wednesday for PCI procedure     Increase activity slowly    Complete by:  As directed           Current Discharge Medication List    START taking these medications   Details  clopidogrel (PLAVIX) 75 MG tablet Take 1 tablet (75 mg total) by mouth daily. Qty: 30 tablet,  Refills: 0      CONTINUE these medications which have CHANGED   Details  insulin lispro protamine-lispro (HUMALOG 75/25 MIX) (75-25) 100 UNIT/ML SUSP injection Inject 22 Units into the skin 2 (two) times daily with a meal. Qty: 6 vial, Refills: 1    metoprolol succinate (TOPROL-XL) 100 MG 24 hr tablet Take 1 tablet (100 mg total) by mouth 2 (two) times daily. Take with or immediately following a meal. Qty: 60 tablet, Refills: 0    potassium chloride SA (K-DUR,KLOR-CON) 20 MEQ tablet Take 1 tablet (20 mEq total) by mouth daily. Qty: 180 tablet, Refills: 3      CONTINUE these medications which have NOT CHANGED   Details  acetaminophen (TYLENOL) 500 MG tablet Take 500 mg by mouth as needed for mild pain.     aspirin 81 MG chewable tablet Chew 81 mg by mouth daily.    calcitRIOL (ROCALTROL) 0.25 MCG capsule Take 0.25 mcg by mouth. Monday, Wed and Friday    DIGOX 125 MCG tablet Take 1 tablet by mouth  every other day Qty: 45 tablet, Refills: 1    furosemide (LASIX) 80 MG tablet Take 1 tablet by mouth two  times daily Qty: 180 tablet, Refills: 1    glucose blood (FREESTYLE LITE) test strip 1 each by Other route 2 (two) times daily as needed for other. Dx: 250.00 Qty: 100 each, Refills: 12    hydrALAZINE (APRESOLINE) 25 MG tablet Take 1 and 1/2 tablets by  mouth 3 times daily Qty: 315 tablet, Refills: 0    isosorbide mononitrate (IMDUR) 30 MG 24 hr tablet Take 0.5 tablets (15 mg total) by mouth daily. Qty: 90 tablet, Refills: 1    Lancets (FREESTYLE) lancets 1 each by Other route 2 (two) times daily as needed for other. Qty: 100 each, Refills: 12      STOP taking these medications     Calcium Carbonate Antacid (TUMS SMOOTHIES PO)      warfarin (COUMADIN) 5 MG tablet        Allergies  Allergen Reactions  . Ace Inhibitors Other (See Comments)    Stopped by Dr. Burt Knack due to progressive renal failure  . Prednisone Other (See Comments)    Raised blood sugar to almost 900    Follow-up Information    Follow up with [facility] On 05/16/2015.   Specialty:  Cardiology   Why:  Please arrive to short stay at 8:30am for pre-operative testing. Your cardiac catheterization will be at 1:00pm.   Contact information:   [address] [id number] [place] [phone]       The results of significant diagnostics from this hospitalization (including imaging, microbiology, ancillary and laboratory) are listed below for reference.    Significant Diagnostic Studies: Dg Chest 2 View  05/07/2015   CLINICAL DATA:  Shortness of breath for  2 days.  EXAM: CHEST  2 VIEW  COMPARISON:  12/05/2014  FINDINGS: The lungs are hyperinflated likely secondary to COPD. There is no focal parenchymal opacity. There is no pleural effusion or pneumothorax. The heart and mediastinal contours are unremarkable. There is a dual lead AICD.  The osseous structures are unremarkable.  IMPRESSION: No active cardiopulmonary disease.   Electronically Signed   By: Kathreen Devoid   On: 05/07/2015 10:08    Microbiology: No results found for this or any previous visit (from the past 240 hour(s)).   Labs: Basic Metabolic Panel:  Recent Labs Lab 05/07/15 0940 05/08/15 0535 05/09/15 0453 05/10/15 1040  NA 142 139 138 140  K 4.1 3.3* 3.0* 3.2*  CL 106 104 103 108  CO2 21* 24 24 23   GLUCOSE 245* 202* 105* 191*  BUN 72* 67* 64* 60*  CREATININE 4.65* 4.56* 4.66* 4.55*  CALCIUM 9.7 8.9 9.0 9.2  PHOS  --  4.3  --   --    Liver Function Tests:  Recent Labs Lab 05/07/15 0940 05/08/15 0535  AST 22  --   ALT 15*  --   ALKPHOS 27*  --   BILITOT 1.1  --   PROT 6.8  --   ALBUMIN 4.0 3.2*   No results for input(s): LIPASE, AMYLASE in the last 168 hours. No results for input(s): AMMONIA in the last 168 hours. CBC:  Recent Labs Lab 05/07/15 0940 05/08/15 0535 05/09/15 0453  WBC 7.7 5.9 6.2  NEUTROABS 5.7  --   --   HGB 12.6*  11.4* 12.3*  HCT 38.1* 34.0* 36.1*  MCV 97.2 95.8 94.0  PLT 132* 104* 119*   Cardiac Enzymes:  Recent Labs Lab 05/07/15 0941 05/07/15 1854 05/07/15 2341 05/08/15 0535  TROPONINI 0.06* 0.09* 0.06* 0.08*   BNP: BNP (last 3 results)  Recent Labs  12/05/14 0735 05/07/15 0941  BNP 1144.6* 1284.9*    ProBNP (last 3 results) No results for input(s): PROBNP in the last 8760 hours.  CBG:  Recent Labs Lab 05/09/15 1839 05/09/15 2104 05/10/15 0118 05/10/15 0604 05/10/15 1214  GLUCAP 116* 227* 180* 177* 130*       Signed:  Velvet Bathe  [facility] 05/10/2015, 3:38 PM

## 2015-05-10 NOTE — Progress Notes (Addendum)
Patient needed to be scheduled for staged PCI prior to discharge due to advanced kidney disease.  The procedure is scheduled for Wednesday, August 24th at 1:00 PM with Dr. Burt Knack. The patient was instructed to be here at 8:30 AM and report to Short Stay for pre-procedure testing and adequate fluid hydration.  The patient understands risks include but are not limited to stroke (1 in 1000), death (1 in 54), kidney failure [usually temporary] (1 in 500), bleeding (1 in 200), allergic reaction [possibly serious] (1 in 200).   Dr. Tamala Julian recommends the patient have a BMET on 05/11/15 and 05/15/15. The patient wishes to go to his PCP office to obtain these since it is closer to his home. He was given Rx's for the labs. He was informed he could go to the [employer] office at [facility] if he could not obtain these at his PCP's office.   Patient was on Coumadin (due to chronic atrial fibrillation) prior to his cath this week. He has been on subcutaneous heparin since. With his staged PCI scheduled for 05/16/15 it was decided to best not start his coumadin again. Pharmacy was called to look at other possible options but he was not a candidate for Lovenox due to his ESRD. It appears our only option at this point is to hold his coumadin until his catheterization. Dr. Tamala Julian agreed this was the best option for the patient as well.  The risks of stopping Coumadin, including possible stroke or embolism, were discussed in detail with the patient and he was urged to seek immediate medical attention if he notices any worrisome changes in his health. He verbally understood the risks and benefits of stopping the Coumadin for his catheterization procedure. He wishes to proceed with the procedure and stop the medication until then. We will plan to restart the Coumadin following his procedure next week.  Signed, Dineen Kid, PA-C 05/10/2015, 2:56 PM Pager: [phone]

## 2015-05-11 ENCOUNTER — Other Ambulatory Visit (INDEPENDENT_AMBULATORY_CARE_PROVIDER_SITE_OTHER): Payer: Medicare Other

## 2015-05-11 ENCOUNTER — Telehealth: Payer: Self-pay | Admitting: *Deleted

## 2015-05-11 ENCOUNTER — Other Ambulatory Visit: Payer: Self-pay | Admitting: Internal Medicine

## 2015-05-11 DIAGNOSIS — N184 Chronic kidney disease, stage 4 (severe): Secondary | ICD-10-CM

## 2015-05-11 DIAGNOSIS — N189 Chronic kidney disease, unspecified: Secondary | ICD-10-CM | POA: Diagnosis not present

## 2015-05-11 LAB — HEPATITIS B CORE ANTIBODY, IGM: Hep B C IgM: NEGATIVE

## 2015-05-11 LAB — BASIC METABOLIC PANEL
BUN: 63 mg/dL — AB (ref 6–23)
CALCIUM: 9.8 mg/dL (ref 8.4–10.5)
CO2: 25 meq/L (ref 19–32)
Chloride: 102 mEq/L (ref 96–112)
Creatinine, Ser: 4.48 mg/dL — ABNORMAL HIGH (ref 0.40–1.50)
GFR: 13.46 mL/min — AB (ref >60.00)
GLUCOSE: 231 mg/dL — AB (ref 70–99)
POTASSIUM: 4 meq/L (ref 3.5–5.1)
Sodium: 141 mEq/L (ref 135–145)

## 2015-05-11 LAB — HEPATITIS B SURFACE ANTIBODY,QUALITATIVE: Hep B S Ab: NONREACTIVE

## 2015-05-11 NOTE — Telephone Encounter (Signed)
Transition Care Management Follow-up Telephone Call  How have you been since you were released from the hospital? "Doing well but not out of the woods"   Do you understand why you were in the hospital? YES   Do you understand the discharge instructions? NO  Items Reviewed:  Medications reviewed: YES  Allergies reviewed: YES  Dietary changes reviewed: YES  Referrals reviewed: YES; getting cath done next week    Functional Questionnaire:  Activities of Daily Living (ADLs):   He states they are independent in the following: Yes States they require assistance with the following: NO  Any transportation issues/concerns?: NO   Any patient concerns? NO   Confirmed importance and date/time of follow-up visits scheduled: YES; Appointment scheduled Tuesday, 30th at 10:30. Patient has cardiac cath scheduled for Wednesday, the 24th.    Confirmed with patient if condition begins to worsen call PCP or go to the ER.  Patient was given the Call-a-Nurse line [phone]: YES

## 2015-05-15 ENCOUNTER — Other Ambulatory Visit (INDEPENDENT_AMBULATORY_CARE_PROVIDER_SITE_OTHER): Payer: Medicare Other

## 2015-05-15 DIAGNOSIS — N184 Chronic kidney disease, stage 4 (severe): Secondary | ICD-10-CM

## 2015-05-15 LAB — BASIC METABOLIC PANEL
BUN: 61 mg/dL — AB (ref 6–23)
CALCIUM: 9.9 mg/dL (ref 8.4–10.5)
CO2: 27 meq/L (ref 19–32)
CREATININE: 4.37 mg/dL — AB (ref 0.40–1.50)
Chloride: 103 mEq/L (ref 96–112)
GFR: 13.86 mL/min — CL (ref >60.00)
Glucose, Bld: 209 mg/dL — ABNORMAL HIGH (ref 70–99)
Potassium: 3.7 mEq/L (ref 3.5–5.1)
Sodium: 140 mEq/L (ref 135–145)

## 2015-05-16 ENCOUNTER — Ambulatory Visit (HOSPITAL_COMMUNITY)
Admission: RE | Admit: 2015-05-16 | Discharge: 2015-05-17 | Disposition: A | Discharge status: Home / Self Care | Payer: Medicare Other | Source: Ambulatory Visit | Attending: Cardiovascular Disease | Admitting: Cardiovascular Disease

## 2015-05-16 ENCOUNTER — Encounter (HOSPITAL_COMMUNITY): Payer: Self-pay | Admitting: Cardiovascular Disease

## 2015-05-16 ENCOUNTER — Encounter (HOSPITAL_COMMUNITY)
Admission: RE | Disposition: A | Discharge status: Home / Self Care | Payer: Self-pay | Source: Ambulatory Visit | Attending: Cardiovascular Disease

## 2015-05-16 DIAGNOSIS — N2581 Secondary hyperparathyroidism of renal origin: Secondary | ICD-10-CM | POA: Insufficient documentation

## 2015-05-16 DIAGNOSIS — I255 Ischemic cardiomyopathy: Secondary | ICD-10-CM | POA: Diagnosis not present

## 2015-05-16 DIAGNOSIS — G4733 Obstructive sleep apnea (adult) (pediatric): Secondary | ICD-10-CM | POA: Insufficient documentation

## 2015-05-16 DIAGNOSIS — Z955 Presence of coronary angioplasty implant and graft: Secondary | ICD-10-CM | POA: Diagnosis not present

## 2015-05-16 DIAGNOSIS — N185 Chronic kidney disease, stage 5: Secondary | ICD-10-CM | POA: Insufficient documentation

## 2015-05-16 DIAGNOSIS — E1165 Type 2 diabetes mellitus with hyperglycemia: Secondary | ICD-10-CM | POA: Diagnosis present

## 2015-05-16 DIAGNOSIS — E1122 Type 2 diabetes mellitus with diabetic chronic kidney disease: Secondary | ICD-10-CM | POA: Diagnosis not present

## 2015-05-16 DIAGNOSIS — I482 Chronic atrial fibrillation, unspecified: Secondary | ICD-10-CM | POA: Diagnosis present

## 2015-05-16 DIAGNOSIS — I428 Other cardiomyopathies: Secondary | ICD-10-CM | POA: Diagnosis not present

## 2015-05-16 DIAGNOSIS — I2584 Coronary atherosclerosis due to calcified coronary lesion: Secondary | ICD-10-CM | POA: Insufficient documentation

## 2015-05-16 DIAGNOSIS — I2510 Atherosclerotic heart disease of native coronary artery without angina pectoris: Secondary | ICD-10-CM | POA: Diagnosis not present

## 2015-05-16 DIAGNOSIS — I5043 Acute on chronic combined systolic (congestive) and diastolic (congestive) heart failure: Secondary | ICD-10-CM | POA: Diagnosis not present

## 2015-05-16 DIAGNOSIS — I120 Hypertensive chronic kidney disease with stage 5 chronic kidney disease or end stage renal disease: Secondary | ICD-10-CM | POA: Diagnosis not present

## 2015-05-16 DIAGNOSIS — R0609 Other forms of dyspnea: Secondary | ICD-10-CM | POA: Diagnosis present

## 2015-05-16 DIAGNOSIS — IMO0002 Reserved for concepts with insufficient information to code with codable children: Secondary | ICD-10-CM | POA: Diagnosis present

## 2015-05-16 DIAGNOSIS — E785 Hyperlipidemia, unspecified: Secondary | ICD-10-CM | POA: Insufficient documentation

## 2015-05-16 DIAGNOSIS — G4730 Sleep apnea, unspecified: Secondary | ICD-10-CM | POA: Diagnosis present

## 2015-05-16 DIAGNOSIS — I10 Essential (primary) hypertension: Secondary | ICD-10-CM | POA: Diagnosis present

## 2015-05-16 HISTORY — PX: CARDIAC CATHETERIZATION: SHX172

## 2015-05-16 LAB — CBC
HCT: 36.1 % — ABNORMAL LOW (ref 39.0–52.0)
Hemoglobin: 12.2 g/dL — ABNORMAL LOW (ref 13.0–17.0)
MCH: 31.9 pg (ref 26.0–34.0)
MCHC: 33.8 g/dL (ref 30.0–36.0)
MCV: 94.5 fL (ref 78.0–100.0)
PLATELETS: 132 10*3/uL — AB (ref 150–400)
RBC: 3.82 MIL/uL — AB (ref 4.22–5.81)
RDW: 14.3 % (ref 11.5–15.5)
WBC: 5.7 10*3/uL (ref 4.0–10.5)

## 2015-05-16 LAB — GLUCOSE, CAPILLARY
GLUCOSE-CAPILLARY: 135 mg/dL — AB (ref 65–99)
GLUCOSE-CAPILLARY: 65 mg/dL (ref 65–99)
Glucose-Capillary: 126 mg/dL — ABNORMAL HIGH (ref 65–99)
Glucose-Capillary: 116 mg/dL — ABNORMAL HIGH (ref 65–99)
Glucose-Capillary: 81 mg/dL (ref 65–99)

## 2015-05-16 LAB — PROTIME-INR
INR: 1.3 (ref 0.00–1.49)
Prothrombin Time: 16.3 seconds — ABNORMAL HIGH (ref 11.6–15.2)

## 2015-05-16 LAB — POCT ACTIVATED CLOTTING TIME
ACTIVATED CLOTTING TIME: 251 s
Activated Clotting Time: 239 seconds

## 2015-05-16 SURGERY — CORONARY STENT INTERVENTION
Anesthesia: LOCAL

## 2015-05-16 MED ORDER — ACETAMINOPHEN 325 MG PO TABS: 650.0000 mg | ORAL_TABLET | ORAL | Status: DC | PRN

## 2015-05-16 MED ORDER — INSULIN ASPART PROT & ASPART (70-30 MIX) 100 UNIT/ML ~~LOC~~ SUSP
22.0000 [IU] | Freq: Two times a day (BID) | SUBCUTANEOUS | Status: DC
  Administered 2015-05-16 – 2015-05-17 (×2): 22 [IU] via SUBCUTANEOUS
  Filled 2015-05-16 (×2): qty 10

## 2015-05-16 MED ORDER — OXYCODONE-ACETAMINOPHEN 5-325 MG PO TABS: 1.0000 | ORAL_TABLET | ORAL | Status: DC | PRN

## 2015-05-16 MED ORDER — FENTANYL CITRATE (PF) 100 MCG/2ML IJ SOLN
INTRAMUSCULAR | Status: AC
  Filled 2015-05-16: qty 4

## 2015-05-16 MED ORDER — MIDAZOLAM HCL 2 MG/2ML IJ SOLN
INTRAMUSCULAR | Status: AC
  Filled 2015-05-16: qty 4

## 2015-05-16 MED ORDER — CLOPIDOGREL BISULFATE 75 MG PO TABS
75.0000 mg | ORAL_TABLET | Freq: Every day | ORAL | Status: DC
Start: 2015-05-17 — End: 2015-05-17
  Administered 2015-05-17: 75 mg via ORAL
  Filled 2015-05-16: qty 1

## 2015-05-16 MED ORDER — ASPIRIN 81 MG PO CHEW
CHEWABLE_TABLET | ORAL | Status: AC
  Administered 2015-05-16: 81 mg via ORAL
  Filled 2015-05-16: qty 1

## 2015-05-16 MED ORDER — HEPARIN SODIUM (PORCINE) 1000 UNIT/ML IJ SOLN
INTRAMUSCULAR | Status: DC | PRN
  Administered 2015-05-16: 2000 [IU] via INTRAVENOUS
  Administered 2015-05-16: 5000 [IU] via INTRAVENOUS

## 2015-05-16 MED ORDER — SODIUM CHLORIDE 0.9 % IJ SOLN: 3.0000 mL | Freq: Two times a day (BID) | INTRAMUSCULAR | Status: DC

## 2015-05-16 MED ORDER — NITROGLYCERIN 0.2 MG/ML ON CALL CATH LAB
INTRAVENOUS | Status: AC
  Filled 2015-05-16: qty 1

## 2015-05-16 MED ORDER — ANGIOPLASTY BOOK
Freq: Once | Status: AC
  Administered 2015-05-16: 22:00:00
  Filled 2015-05-16: qty 1

## 2015-05-16 MED ORDER — WARFARIN SODIUM 6 MG PO TABS
6.0000 mg | ORAL_TABLET | Freq: Once | ORAL | Status: DC
  Filled 2015-05-16: qty 1

## 2015-05-16 MED ORDER — ISOSORBIDE MONONITRATE ER 30 MG PO TB24
15.0000 mg | ORAL_TABLET | Freq: Every day | ORAL | Status: DC
  Administered 2015-05-17: 10:00:00 15 mg via ORAL
  Filled 2015-05-16 (×2): qty 1

## 2015-05-16 MED ORDER — SODIUM CHLORIDE 0.9 % IV SOLN
INTRAVENOUS | Status: DC
  Administered 2015-05-16: 10:00:00 via INTRAVENOUS

## 2015-05-16 MED ORDER — SODIUM CHLORIDE 0.9 % IV SOLN: 250.0000 mL | INTRAVENOUS | Status: DC | PRN

## 2015-05-16 MED ORDER — ASPIRIN 81 MG PO CHEW
81.0000 mg | CHEWABLE_TABLET | ORAL | Status: AC
  Administered 2015-05-16: 81 mg via ORAL

## 2015-05-16 MED ORDER — FENTANYL CITRATE (PF) 100 MCG/2ML IJ SOLN
INTRAMUSCULAR | Status: DC | PRN
  Administered 2015-05-16: 25 ug via INTRAVENOUS

## 2015-05-16 MED ORDER — FUROSEMIDE 40 MG PO TABS
80.0000 mg | ORAL_TABLET | Freq: Two times a day (BID) | ORAL | Status: DC
  Administered 2015-05-16 – 2015-05-17 (×2): 80 mg via ORAL
  Filled 2015-05-16 (×3): qty 2

## 2015-05-16 MED ORDER — HEPARIN SODIUM (PORCINE) 1000 UNIT/ML IJ SOLN
INTRAMUSCULAR | Status: AC
  Filled 2015-05-16: qty 1

## 2015-05-16 MED ORDER — ONDANSETRON HCL 4 MG/2ML IJ SOLN
4.0000 mg | Freq: Four times a day (QID) | INTRAMUSCULAR | Status: DC | PRN
  Filled 2015-05-16: qty 2

## 2015-05-16 MED ORDER — SODIUM CHLORIDE 0.9 % IJ SOLN: 3.0000 mL | INTRAMUSCULAR | Status: DC | PRN

## 2015-05-16 MED ORDER — HEPARIN (PORCINE) IN NACL 2-0.9 UNIT/ML-% IJ SOLN
INTRAMUSCULAR | Status: AC
  Filled 2015-05-16: qty 1000

## 2015-05-16 MED ORDER — METOPROLOL SUCCINATE ER 50 MG PO TB24
100.0000 mg | ORAL_TABLET | Freq: Two times a day (BID) | ORAL | Status: DC
  Administered 2015-05-16 – 2015-05-17 (×2): 100 mg via ORAL
  Filled 2015-05-16 (×3): qty 2

## 2015-05-16 MED ORDER — SODIUM CHLORIDE 0.9 % IJ SOLN
3.0000 mL | Freq: Two times a day (BID) | INTRAMUSCULAR | Status: DC
  Administered 2015-05-16: 18:00:00 3 mL via INTRAVENOUS

## 2015-05-16 MED ORDER — LIDOCAINE HCL (PF) 1 % IJ SOLN
INTRAMUSCULAR | Status: AC
  Filled 2015-05-16: qty 30

## 2015-05-16 MED ORDER — WARFARIN SODIUM 5 MG PO TABS
5.0000 mg | ORAL_TABLET | Freq: Once | ORAL | Status: AC
  Administered 2015-05-16: 18:00:00 5 mg via ORAL
  Filled 2015-05-16: qty 1

## 2015-05-16 MED ORDER — POTASSIUM CHLORIDE CRYS ER 20 MEQ PO TBCR
20.0000 meq | EXTENDED_RELEASE_TABLET | Freq: Every day | ORAL | Status: DC
  Administered 2015-05-17: 20 meq via ORAL
  Filled 2015-05-16 (×2): qty 1

## 2015-05-16 MED ORDER — IOHEXOL 350 MG/ML SOLN
INTRAVENOUS | Status: DC | PRN
  Administered 2015-05-16: 55 mL via INTRACARDIAC

## 2015-05-16 MED ORDER — SODIUM CHLORIDE 0.9 % IV SOLN: INTRAVENOUS | Status: AC

## 2015-05-16 MED ORDER — HYDRALAZINE HCL 25 MG PO TABS
25.0000 mg | ORAL_TABLET | Freq: Three times a day (TID) | ORAL | Status: DC
  Administered 2015-05-16 – 2015-05-17 (×3): 25 mg via ORAL
  Filled 2015-05-16 (×3): qty 1

## 2015-05-16 MED ORDER — WARFARIN - PHARMACIST DOSING INPATIENT: Freq: Every day | Status: DC

## 2015-05-16 MED ORDER — MIDAZOLAM HCL 2 MG/2ML IJ SOLN
INTRAMUSCULAR | Status: DC | PRN
  Administered 2015-05-16 (×2): 1 mg via INTRAVENOUS

## 2015-05-16 MED ORDER — NITROGLYCERIN 1 MG/10 ML FOR IR/CATH LAB
INTRA_ARTERIAL | Status: DC | PRN
  Administered 2015-05-16: 14:00:00

## 2015-05-16 SURGICAL SUPPLY — 15 items
BALLN ANGIOSCULPT RX 3.0X10 (BALLOONS) ×2
BALLN ~~LOC~~ EUPHORA RX 3.75X12 (BALLOONS) ×2
BALLOON ANGIOSCULPT RX 3.0X10 (BALLOONS) ×1 IMPLANT
BALLOON ~~LOC~~ EUPHORA RX 3.75X12 (BALLOONS) ×1 IMPLANT
CATH VISTA GUIDE 6FR XBLAD3.5 (CATHETERS) ×2 IMPLANT
DEVICE CLOSURE PERCLS PRGLD 6F (VASCULAR PRODUCTS) ×1 IMPLANT
KIT ENCORE 26 ADVANTAGE (KITS) ×2 IMPLANT
KIT HEART LEFT (KITS) ×2 IMPLANT
PACK CARDIAC CATHETERIZATION (CUSTOM PROCEDURE TRAY) ×2 IMPLANT
PERCLOSE PROGLIDE 6F (VASCULAR PRODUCTS) ×2
SHEATH PINNACLE 6F 10CM (SHEATH) ×2 IMPLANT
STENT RESOLUTE INTEG 3.5X15 (Permanent Stent) ×2 IMPLANT
TRANSDUCER W/STOPCOCK (MISCELLANEOUS) ×2 IMPLANT
TUBING CIL FLEX 10 FLL-RA (TUBING) ×2 IMPLANT
WIRE COUGAR XT STRL 190CM (WIRE) ×2 IMPLANT

## 2015-05-16 NOTE — Interval H&P Note (Signed)
History and Physical Interval Note:  05/16/2015 11:42 AM  [name] [name].  has presented today for surgery, with the diagnosis of CAD  The various methods of treatment have been discussed with the patient and family. After consideration of risks, benefits and other options for treatment, the patient has consented to  Procedure(s): Coronary Stent Intervention (N/A) as a surgical intervention .  The patient's history has been reviewed, patient examined, no change in status, stable for surgery.  I have reviewed the patient's chart and labs.  Questions were answered to the patient's satisfaction.    Pt returns for PCI of the proximal LAD in the setting of advanced kidney disease. Plan PCI today. Risks indications discussed with patient and family who understand.   Sherren Mocha

## 2015-05-16 NOTE — Progress Notes (Signed)
ANTICOAGULATION CONSULT NOTE - Initial Consult  Pharmacy Consult:  Coumadin Indication:  History of Afib  Allergies  Allergen Reactions  . Ace Inhibitors Other (See Comments)    Stopped by Dr. Burt Knack due to progressive renal failure  . Prednisone Other (See Comments)    Raised blood sugar to almost 900    Patient Measurements: Height: 5\' 11"  (180.3 cm) Weight: 170 lb (77.111 kg) IBW/kg (Calculated) : 75.3  Vital Signs: Temp: 98.1 F (36.7 C) (08/24 1610) Temp Source: Oral (08/24 1610) BP: 169/81 mmHg (08/24 1610) Pulse Rate: 75 (08/24 1610)  Labs:  Recent Labs  05/15/15 0936 05/16/15 0945 05/16/15 1550  HGB  --  12.2*  --   HCT  --  36.1*  --   PLT  --  132*  --   LABPROT  --   --  16.3*  INR  --   --  1.30  CREATININE 4.37*  --   --     Estimated Creatinine Clearance: 13.9 mL/min (by C-G formula based on Cr of 4.37).   Medical History: Past Medical History  Diagnosis Date  . Chronic atrial fibrillation     a. Historically difficult rate control. b. s/p CRT-D implantation with anticipation of AV junction ablation but patient then was able to accomplish rate control and ablation not undertaken.; AVN ablation 08/2013 by Dr Lovena Le  . CARDIOMYOPATHY, PRIMARY, DILATED     a. Mixed ICM/NICM - EF 15% by echo 05/2009; St. Jude CRT-D implanted 06/2009. b. Echo 04/2013 - EF 20%, mild LVH.  [name] Chronic systolic CHF (congestive heart failure)   . CORONARY ARTERY DISEASE     a. BMS to LAD 04/2008. b. Cath 06/2009: nonobstructive disease.  [name] SLEEP APNEA   . Hypertension   . Hyperlipidemia   . Diabetes mellitus   . Osteoarthritis   . Tubular adenoma of colon   . Secondary hyperparathyroidism   . CKD (chronic kidney disease), stage IV     a. Right brachiocephalic AV fistula 11/1495. Neph = [place].  . Vertigo   . Valvular heart disease     a. Echo 04/2013: mild AI, mild MR.  . Pulmonary HTN     a. PA pressure 61mmHg by echo 05/01/13.  . Prostate cancer     a. s/p radical  prostatectomy.  . Colon cancer   . Skin cancer   . Presence of permanent cardiac pacemaker       Assessment: 31 YOM with history of Afib on Coumadin PTA.  He is s/p cath with PCI today and Coumadin to resume post-op.  Baseline INR low at 1.3.  Noted he has thrombocytopenia.  RN reported some bleeding at sheath site, but it has stop after pressure was applied.  She will notify Pharmacy should the patient bleeds again.  Home Coumadin dose: 5mg  daily except 2.5 on Fri and Sun, per clinic note in July   Goal of Therapy:  INR 2-3    Plan:  - Coumadin 5mg  PO today - Daily PT / INR - Monitor for bleeding    [name] [name] D. Mina Marble, PharmD, BCPS Pager:  [phone] 05/16/2015, 5:32 PM

## 2015-05-16 NOTE — H&P (View-Only) (Signed)
       Patient Name: [name] [name]. Date of Encounter: 05/10/2015    SUBJECTIVE: Still complains of dyspnea. No chest discomfort.  TELEMETRY:  Paced rhythm Filed Vitals:   05/09/15 2100 05/10/15 0115 05/10/15 0405 05/10/15 0828  BP: 137/47 149/60 162/67 188/75  Pulse:   75 76  Temp:   97.6 F (36.4 C) 97.7 F (36.5 C)  TempSrc:   Axillary Oral  Resp: 19  20 20   Height:      Weight:   76.3 kg (168 lb 3.4 oz)   SpO2:   98% 99%    Intake/Output Summary (Last 24 hours) at 05/10/15 0928 Last data filed at 05/10/15 0830  Gross per 24 hour  Intake 1193.33 ml  Output    900 ml  Net 293.33 ml   LABS: Basic Metabolic Panel:  Recent Labs  05/08/15 0535 05/09/15 0453  NA 139 138  K 3.3* 3.0*  CL 104 103  CO2 24 24  GLUCOSE 202* 105*  BUN 67* 64*  CREATININE 4.56* 4.66*  CALCIUM 8.9 9.0  PHOS 4.3  --    CBC:  Recent Labs  05/07/15 0940 05/08/15 0535 05/09/15 0453  WBC 7.7 5.9 6.2  NEUTROABS 5.7  --   --   HGB 12.6* 11.4* 12.3*  HCT 38.1* 34.0* 36.1*  MCV 97.2 95.8 94.0  PLT 132* 104* 119*   Cardiac Enzymes:  Recent Labs  05/07/15 1854 05/07/15 2341 05/08/15 0535  TROPONINI 0.09* 0.06* 0.08*   BNP: Invalid input(s): POCBNP Hemoglobin A1C: No results for input(s): HGBA1C in the last 72 hours. Fasting Lipid Panel:  Recent Labs  05/08/15 0535  CHOL 172  HDL 25*  LDLCALC 105*  TRIG 211*  CHOLHDL 6.9    Radiology/Studies:  No new data  Physical Exam: Blood pressure 188/75, pulse 76, temperature 97.7 F (36.5 C), temperature source Oral, resp. rate 20, height 5\' 11"  (1.803 m), weight 76.3 kg (168 lb 3.4 oz), SpO2 99 %. Weight change: -1.22 kg (-2 lb 11 oz)  Wt Readings from Last 3 Encounters:  05/10/15 76.3 kg (168 lb 3.4 oz)  03/15/15 80.468 kg (177 lb 6.4 oz)  03/14/15 79.379 kg (175 lb)    Right femoral cath site is unremarkable. Chest is clear. Cardiac exam is unremarkable. Extremities reveal no edema. Femoral site as noted  above.     ASSESSMENT:  1. Moderately severe proximal LAD, to have PCI performed as a staged procedure because of renal impairment. We will need to arrange PCI for 7 days prior to this discharge.   2. Stage V chronic kidney disease, still awaiting a.m. laboratory data. 3. Chronic systolic heart failure with exertional dyspnea, but no active evidence of volume overload.  Plan:  Basic metabolic panel is ordered. If there is not a dramatic rise in the creatinine this morning, he can be discharged with a follow-up basic metabolic panel in a.m. as an outpatient.   Prior to discharge we must arrange for staged PCI.  Demetrios Isaacs 05/10/2015, 9:28 AM

## 2015-05-17 ENCOUNTER — Encounter (HOSPITAL_COMMUNITY): Payer: Self-pay | Admitting: Physician Assistant

## 2015-05-17 DIAGNOSIS — I5043 Acute on chronic combined systolic (congestive) and diastolic (congestive) heart failure: Secondary | ICD-10-CM | POA: Diagnosis not present

## 2015-05-17 DIAGNOSIS — R0609 Other forms of dyspnea: Secondary | ICD-10-CM

## 2015-05-17 DIAGNOSIS — I2510 Atherosclerotic heart disease of native coronary artery without angina pectoris: Secondary | ICD-10-CM | POA: Diagnosis not present

## 2015-05-17 DIAGNOSIS — I120 Hypertensive chronic kidney disease with stage 5 chronic kidney disease or end stage renal disease: Secondary | ICD-10-CM | POA: Diagnosis not present

## 2015-05-17 DIAGNOSIS — I5023 Acute on chronic systolic (congestive) heart failure: Secondary | ICD-10-CM

## 2015-05-17 DIAGNOSIS — I2584 Coronary atherosclerosis due to calcified coronary lesion: Secondary | ICD-10-CM | POA: Diagnosis not present

## 2015-05-17 LAB — CBC
HCT: 35.2 % — ABNORMAL LOW (ref 39.0–52.0)
Hemoglobin: 11.8 g/dL — ABNORMAL LOW (ref 13.0–17.0)
MCH: 31.6 pg (ref 26.0–34.0)
MCHC: 33.5 g/dL (ref 30.0–36.0)
MCV: 94.1 fL (ref 78.0–100.0)
PLATELETS: 133 10*3/uL — AB (ref 150–400)
RBC: 3.74 MIL/uL — ABNORMAL LOW (ref 4.22–5.81)
RDW: 14.3 % (ref 11.5–15.5)
WBC: 5.8 10*3/uL (ref 4.0–10.5)

## 2015-05-17 LAB — BASIC METABOLIC PANEL
Anion gap: 11 (ref 5–15)
BUN: 60 mg/dL — AB (ref 6–20)
CALCIUM: 9.2 mg/dL (ref 8.9–10.3)
CHLORIDE: 107 mmol/L (ref 101–111)
CO2: 23 mmol/L (ref 22–32)
CREATININE: 4.21 mg/dL — AB (ref 0.61–1.24)
GFR calc non Af Amer: 12 mL/min — ABNORMAL LOW (ref >60)
GFR, EST AFRICAN AMERICAN: 14 mL/min — AB (ref >60)
Glucose, Bld: 126 mg/dL — ABNORMAL HIGH (ref 65–99)
Potassium: 3.5 mmol/L (ref 3.5–5.1)
SODIUM: 141 mmol/L (ref 135–145)

## 2015-05-17 LAB — GLUCOSE, CAPILLARY: Glucose-Capillary: 141 mg/dL — ABNORMAL HIGH (ref 65–99)

## 2015-05-17 LAB — PROTIME-INR
INR: 1.24 (ref 0.00–1.49)
PROTHROMBIN TIME: 15.7 s — AB (ref 11.6–15.2)

## 2015-05-17 MED ORDER — WARFARIN SODIUM 5 MG PO TABS: ORAL_TABLET | ORAL | Status: DC

## 2015-05-17 NOTE — Discharge Summary (Signed)
Discharge Summary   Patient ID: [name] [name].,  MRN: [id number], DOB/AGE: 1930-12-03 81 y.o.  Admit date: 05/16/2015 Discharge date: 05/17/2015  Primary Care Provider: Nyoka Cowden Primary Cardiologist: Dr. Burt Knack  Discharge Diagnoses Principal Problem:   Dyspnea on exertion Active Problems:   Diabetes type 2, uncontrolled   Hyperlipidemia   Essential hypertension   Coronary atherosclerosis   Chronic atrial fibrillation   Sleep apnea   Allergies Allergies  Allergen Reactions  . Ace Inhibitors Other (See Comments)    Stopped by Dr. Burt Knack due to progressive renal failure  . Prednisone Other (See Comments)    Raised blood sugar to almost 900    Procedures  Cardiac catheterization 05/16/2015 Conclusion     Ost LAD to Prox LAD lesion, 75% stenosed. There is a 0% residual stenosis post intervention.  A drug-eluting stent was placed.  Successful PCI of the proximal LAD with drug-eluting stent implantation and scoring balloon angioplasty  Recommend Plavix 75 mg for at least 6 months. Warfarin will be restarted tonight.      Hospital Course  81 yo male with PMH of CAD s/p BMS to LAD in 2009, chronic atrial fibrillation s/p AVN ablation and CRT-D implantation, chronic systolic HF with mixed ICM/NICM, CKD stage IV-V (AV fistula placed on 03/2012), HTN, HLD, DM, secondary hyperparathyroidism, pulm HTN, OSA on CPAP presented to [facility] on 05/07/2015 as a transfer from [place] with dyspnea and malaise. On presentation, he also endorsed dyspnea with minimal exertion. His symptom was felt to be anginal equivalent. Nephrology was consulted given significant CKD. Although he does have right brachial AVF placed, however he never started on hemodialysis. He underwent diagnostic cardiac catheterization on 05/09/2015 which showed a 75% proximal LAD calcified lesion, 80% small D1 lesion, 25% proximal to mid LAD lesion, 50% mid RCA lesion. He was  discharged on the following day on 05/10/2015 with plan for staged PCI at a later time to avoid contrast nephropathy and completely shut down his kidney. His Coumadin was held during this time.  Patient returned to [facility] on 05/16/2015 The proximal LAD lesion was treated with drug-eluting stent. He was placed on Plavix alone given the need for Coumadin therapy. Coumadin was restarted after cath. He was seen on the following morning on 05/17/2015, at which time he denies significant chest discomfort or shortness breath, he is deemed stable for discharge from cardiology perspective. I have given him a paper prescription for him to bring to his PCP next Wednesday and obtain basic metabolic panel and PT/INR at his PCPs office. I have also arranged outpatient follow-up for him, although patient states he may change to date on the follow-up. Otherwise, he has been stable and ambulated with cardiac rehabilitation without discomfort. Prior to discharge, I have discussed the case with pharmacy, pharmacist recommended take 1 dose of 7.5 mg Coumadin tonight and restart on his previous home dose of 2.5 mg on Monday and Friday and 5 mg on all other days.   Discharge Vitals Blood pressure 152/66, pulse 75, temperature 98.9 F (37.2 C), temperature source Oral, resp. rate 17, height 5\' 11"  (1.803 m), weight 171 lb 4.8 oz (77.7 kg), SpO2 100 %.  Filed Weights   05/16/15 0844 05/17/15 0620  Weight: 170 lb (77.111 kg) 171 lb 4.8 oz (77.7 kg)    Labs  CBC  Recent Labs  05/16/15 0945 05/17/15 0341  WBC 5.7 5.8  HGB 12.2* 11.8*  HCT 36.1* 35.2*  MCV  94.5 94.1  PLT 132* 062*   Basic Metabolic Panel  Recent Labs  05/15/15 0936 05/17/15 0341  NA 140 141  K 3.7 3.5  CL 103 107  CO2 27 23  GLUCOSE 209* 126*  BUN 61* 60*  CREATININE 4.37* 4.21*  CALCIUM 9.9 9.2    Disposition  Pt is being discharged home today in good condition.  Follow-up Plans & Appointments      Follow-up  Information    Follow up with Richardson Dopp, PA-C On 06/11/2015.   Specialties:  Physician Assistant, Radiology, Interventional Cardiology   Why:  2:00pm   Contact information:   [address] [address] [phone]       Follow up with Nyoka Cowden, MD On 05/22/2015.   Specialty:  Internal Medicine   Why:  10:30am. Please obtain BMET and PT/INR on the same day.   Contact information:   [place] [address] [phone]       Discharge Medications    Medication List    STOP taking these medications        aspirin 81 MG chewable tablet      TAKE these medications        acetaminophen 500 MG tablet  Commonly known as:  TYLENOL  Take 500 mg by mouth as needed for mild pain.     calcitRIOL 0.25 MCG capsule  Commonly known as:  ROCALTROL  Take 0.25 mcg by mouth. Monday, Wed and Friday     clopidogrel 75 MG tablet  Commonly known as:  PLAVIX  Take 1 tablet (75 mg total) by mouth daily.     DIGOX 0.125 MG tablet  Generic drug:  digoxin  Take 1 tablet by mouth  every other day     freestyle lancets  1 each by Other route 2 (two) times daily as needed for other.     furosemide 80 MG tablet  Commonly known as:  LASIX  Take 1 tablet by mouth two  times daily     glucose blood test strip  Commonly known as:  FREESTYLE LITE  1 each by Other route 2 (two) times daily as needed for other. Dx: 250.00     hydrALAZINE 25 MG tablet  Commonly known as:  APRESOLINE  Take 1 and 1/2 tablets by  mouth 3 times daily     insulin lispro protamine-lispro (75-25) 100 UNIT/ML Susp injection  Commonly known as:  HUMALOG 75/25 MIX  Inject 22 Units into the skin 2 (two) times daily with a meal.     isosorbide mononitrate 30 MG 24 hr tablet  Commonly known as:  IMDUR  Take 0.5 tablets (15 mg total) by mouth daily.     metoprolol succinate 100 MG 24 hr tablet  Commonly known as:  TOPROL-XL  Take 1 tablet (100 mg total)  by mouth 2 (two) times daily. Take with or immediately following a meal.     potassium chloride SA 20 MEQ tablet  Commonly known as:  K-DUR,KLOR-CON  Take 1 tablet (20 mEq total) by mouth daily.     warfarin 5 MG tablet  Commonly known as:  COUMADIN  Take 2.5mg  (half tablet) Monday and Friday, 5mg  on all other days        Duration of Discharge Encounter   Greater than 30 minutes including physician time.  Hilbert Corrigan PA-C Pager: [phone] 05/17/2015, 12:17 PM

## 2015-05-17 NOTE — Discharge Instructions (Signed)
No driving for 24 hours. No lifting over 5 lbs for 1 week. No sexual activity for 1 week. Keep procedure site clean & dry. If you notice increased pain, swelling, bleeding or pus, call/return!  You may shower, but no soaking baths/hot tubs/pools for 1 week.   Resume your previous coumadin dose. You can take 7.5 mg (1 and half tablet on the night of 8/25).

## 2015-05-17 NOTE — Progress Notes (Signed)
CARDIAC REHAB PHASE I   PRE:  Rate/Rhythm: 76 paced    BP: sitting 161/61    SaO2:   MODE:  Ambulation: 500 ft   POST:  Rate/Rhythm: 88 pacing    BP: sitting 152/66     SaO2:   Steady, tolerated well, no c/o. Ed completed/reviewed. Pt very knowledgeable. Reinforced low sodium, daily wts, Plavix, ex, NTg. Pt interested in CRPII and will send referral to [facility]. [phone]   Darrick Meigs CES, ACSM 05/17/2015 8:59 AM

## 2015-05-17 NOTE — Progress Notes (Signed)
Patient Name: [name] [name]. Date of Encounter: 05/17/2015  Primary Cardiologist: Dr. Burt Knack   Active Problems:   Systolic and diastolic CHF, acute on chronic   Acute on chronic systolic heart failure, NYHA class 4    SUBJECTIVE  Denies any CP or SOB, feels great after cath. Anxious to ambulate today.  CURRENT MEDS . clopidogrel  75 mg Oral Daily  . furosemide  80 mg Oral BID  . hydrALAZINE  25 mg Oral TID  . insulin aspart protamine- aspart  22 Units Subcutaneous BID WC  . isosorbide mononitrate  15 mg Oral Daily  . metoprolol succinate  100 mg Oral BID  . potassium chloride SA  20 mEq Oral Daily  . sodium chloride  3 mL Intravenous Q12H  . Warfarin - Pharmacist Dosing Inpatient   Does not apply q1800    OBJECTIVE  Filed Vitals:   05/16/15 1800 05/16/15 2000 05/16/15 2009 05/17/15 0620  BP: 156/70  144/65 160/60  Pulse: 75  75 60  Temp:   98 F (36.7 C) 98.9 F (37.2 C)  TempSrc:   Oral Oral  Resp: 20 22 13 17   Height:      Weight:      SpO2: 96%  98% 100%    Intake/Output Summary (Last 24 hours) at 05/17/15 0716 Last data filed at 05/16/15 2201  Gross per 24 hour  Intake    690 ml  Output    350 ml  Net    340 ml   Filed Weights   05/16/15 0844  Weight: 170 lb (77.111 kg)    PHYSICAL EXAM  General: Pleasant, NAD. Neuro: Alert and oriented X 3. Moves all extremities spontaneously. Psych: Normal affect. HEENT:  Normal  Neck: Supple without bruits or JVD. Lungs:  Resp regular and unlabored, CTA. Heart: RRR no s3, s4, or murmurs. R groin cath site swollen, however nontender on palpation. Abdomen: Soft, non-tender, non-distended, BS + x 4.  Extremities: No clubbing, cyanosis or edema. DP/PT/Radials 2+ and equal bilaterally.  Accessory Clinical Findings  CBC  Recent Labs  05/16/15 0945 05/17/15 0341  WBC 5.7 5.8  HGB 12.2* 11.8*  HCT 36.1* 35.2*  MCV 94.5 94.1  PLT 132* 941*   Basic Metabolic Panel  Recent Labs  05/15/15 0936  05/17/15 0341  NA 140 141  K 3.7 3.5  CL 103 107  CO2 27 23  GLUCOSE 209* 126*  BUN 61* 60*  CREATININE 4.37* 4.21*  CALCIUM 9.9 9.2    TELE Paced rhythm with HR 60-70    ECG  ventricularly paced rhythm  Echocardiogram 12/06/2014  LV EF: 25% -  30%  ------------------------------------------------------------------- Indications:   CHF - 428.0.  ------------------------------------------------------------------- History:  PMH: Cardiomyopathy, ishcemic. Bi-ventricular defibrillator. Atrial fibrillation. Coronary artery disease. Risk factors: Hypertension. Diabetes mellitus. Dyslipidemia.  ------------------------------------------------------------------- Study Conclusions  - Left ventricle: The cavity size was mildly dilated. Wall thickness was increased in a pattern of mild LVH. Systolic function was severely reduced. The estimated ejection fraction was in the range of 25% to 30%. Diffuse hypokinesis. - Aortic valve: Sclerosis without stenosis. There was mild regurgitation. - Mitral valve: There was mild regurgitation. - Left atrium: The atrium was mildly dilated. - Right atrium: The atrium was mildly dilated. - Pulmonary arteries: PA peak pressure: 48 mm Hg (S).    Radiology/Studies  Dg Chest 2 View  05/07/2015   CLINICAL DATA:  Shortness of breath for 2 days.  EXAM: CHEST  2 VIEW  COMPARISON:  12/05/2014  FINDINGS: The lungs are hyperinflated likely secondary to COPD. There is no focal parenchymal opacity. There is no pleural effusion or pneumothorax. The heart and mediastinal contours are unremarkable. There is a dual lead AICD.  The osseous structures are unremarkable.  IMPRESSION: No active cardiopulmonary disease.   Electronically Signed   By: Kathreen Devoid   On: 05/07/2015 10:08    ASSESSMENT AND PLAN  1. dyapnea on minimal exertion  - L&R heart cath 05/09/2015 75% prox LAD previously treated with BMS, 50% mid RCA, 80% small D1. Planned for  staged PCI  - staged outpatient cath 05/16/2015 75% ost to prox LAD treated with DES  - currently on plavix and coumadin therapy. Coumadin restarted yesterday after cath. Continue hydralazine, imdur and metoprolol. Will discuss with MD regarding function, he has stage V CKD not started on HD yet, possibly discharge today with close outpatient BMET check.  2. Chronic systolic HF: euvolemic on exam  3. Acute on chronic stage IV-V renal insufficiency - has R brachial AVF placed, but never started on HD - nephrology following.   4. CAD s/p BMS to LAD in 2009  5. Moderate pulm HTN by echo 11/2014  6. OSA compliant with CPAP  7. Chronic atrial fibrillation s/p AVN ablation and St Jude CRT-D - on chronic coumadin which is currently on hold. Will start today  Signed, Woodward Ku Pager: [phone]  Patient seen, examined. Available data reviewed. Agree with findings, assessment, and plan as outlined by Almyra Deforest, PA-C. Exam reveals alert, oriented male in NAD. Lungs CTA, heart RRR, no edema. He felt well during his walk with cardiac rehab this am. Plan dc today on plavix and warfarin. He is scheduled to follow-up with Dr Raliegh Ip next Wednesday. Please arrange BMET at the time of that visit to follow his kidney disease and make sure no evidence of contrast nephropathy.  Sherren Mocha, M.D. 05/17/2015 10:03 AM

## 2015-05-22 ENCOUNTER — Encounter: Payer: Self-pay | Admitting: Internal Medicine

## 2015-05-22 ENCOUNTER — Ambulatory Visit (INDEPENDENT_AMBULATORY_CARE_PROVIDER_SITE_OTHER): Payer: Medicare Other | Admitting: Internal Medicine

## 2015-05-22 VITALS — BP 130/70 | HR 75 | Temp 98.4°F | Resp 18 | Ht 71.0 in | Wt 174.0 lb

## 2015-05-22 DIAGNOSIS — E0841 Diabetes mellitus due to underlying condition with diabetic mononeuropathy: Secondary | ICD-10-CM

## 2015-05-22 DIAGNOSIS — I255 Ischemic cardiomyopathy: Secondary | ICD-10-CM

## 2015-05-22 DIAGNOSIS — I10 Essential (primary) hypertension: Secondary | ICD-10-CM

## 2015-05-22 DIAGNOSIS — N189 Chronic kidney disease, unspecified: Secondary | ICD-10-CM

## 2015-05-22 DIAGNOSIS — I5023 Acute on chronic systolic (congestive) heart failure: Secondary | ICD-10-CM | POA: Diagnosis not present

## 2015-05-22 DIAGNOSIS — E0821 Diabetes mellitus due to underlying condition with diabetic nephropathy: Secondary | ICD-10-CM

## 2015-05-22 DIAGNOSIS — I482 Chronic atrial fibrillation, unspecified: Secondary | ICD-10-CM

## 2015-05-22 DIAGNOSIS — I2510 Atherosclerotic heart disease of native coronary artery without angina pectoris: Secondary | ICD-10-CM | POA: Diagnosis not present

## 2015-05-22 DIAGNOSIS — N184 Chronic kidney disease, stage 4 (severe): Secondary | ICD-10-CM

## 2015-05-22 LAB — BASIC METABOLIC PANEL
BUN: 61 mg/dL — AB (ref 6–23)
CALCIUM: 9.5 mg/dL (ref 8.4–10.5)
CHLORIDE: 105 meq/L (ref 96–112)
CO2: 25 mEq/L (ref 19–32)
CREATININE: 4.2 mg/dL — AB (ref 0.40–1.50)
GFR: 14.5 mL/min — AB (ref >60.00)
Glucose, Bld: 105 mg/dL — ABNORMAL HIGH (ref 70–99)
Potassium: 3.6 mEq/L (ref 3.5–5.1)
Sodium: 141 mEq/L (ref 135–145)

## 2015-05-22 LAB — PROTIME-INR
INR: 1.7 ratio — ABNORMAL HIGH (ref 0.8–1.0)
Prothrombin Time: 18.4 s — ABNORMAL HIGH (ref 9.6–13.1)

## 2015-05-22 LAB — HEMOGLOBIN A1C: HEMOGLOBIN A1C: 6.8 % — AB (ref 4.6–6.5)

## 2015-05-22 MED ORDER — CLOPIDOGREL BISULFATE 75 MG PO TABS: 75.0000 mg | ORAL_TABLET | Freq: Every day | ORAL | Status: DC

## 2015-05-22 NOTE — Progress Notes (Signed)
Pre visit review using our clinic review tool, if applicable. No additional management support is needed unless otherwise documented below in the visit note. 

## 2015-05-22 NOTE — Progress Notes (Signed)
Subjective:    Patient ID: [name] Brenner., male    DOB: [birth year], 81 y.o.   MRN: [id number]  HPI  Lab Results  Component Value Date   HGBA1C 8.0* 12/06/2014   Admit date: 05/16/2015 Discharge date: 05/17/2015  Primary Care Provider: Nyoka Cowden Primary Cardiologist: Dr. Burt Knack  Discharge Diagnoses Principal Problem:  Dyspnea on exertion Active Problems:  Diabetes type 2, uncontrolled  Hyperlipidemia  Essential hypertension  Coronary atherosclerosis  Chronic atrial fibrillation  Sleep apnea   Allergies Allergies  Allergen Reactions  . Ace Inhibitors Other (See Comments)    Stopped by Dr. Burt Knack due to progressive renal failure  . Prednisone Other (See Comments)    Raised blood sugar to almost 900    Procedures  Cardiac catheterization 05/16/2015 Conclusion     Ost LAD to Prox LAD lesion, 75% stenosed. There is a 0% residual stenosis post intervention.  A drug-eluting stent was placed.  Successful PCI of the proximal LAD with drug-eluting stent implantation and scoring balloon angioplasty  Recommend Plavix 75 mg for at least 6 months. Warfarin will be restarted tonight.         81 year old patient who is seen following a recent hospital discharge.  He was admitted for a heart catheterization followed by a staged readmission  for PCI.  He has chronic kidney disease and was also evaluated by nephrology.  Since his discharge, he has done quite well.  Denies any shortness of breath or exertional chest pain.  Dyspnea with minimal exertion was his primary complaint.  Preprocedure  Past Medical History  Diagnosis Date  . Chronic atrial fibrillation     a. Historically difficult rate control. b. s/p CRT-D implantation with anticipation of AV junction ablation but patient then was able to accomplish rate control and ablation not undertaken.; AVN ablation 08/2013 by Dr Lovena Le  . CARDIOMYOPATHY, PRIMARY, DILATED     a. Mixed  ICM/NICM - EF 15% by echo 05/2009; St. Jude CRT-D implanted 06/2009. b. Echo 04/2013 - EF 20%, mild LVH.  [name] Chronic systolic CHF (congestive heart failure)   . CORONARY ARTERY DISEASE     a. BMS to LAD 04/2008. b. Cath 06/2009: nonobstructive disease. c. cath 05/16/2015 DES to prox LAD  . SLEEP APNEA   . Hypertension   . Hyperlipidemia   . Diabetes mellitus   . Osteoarthritis   . Tubular adenoma of colon   . Secondary hyperparathyroidism   . CKD (chronic kidney disease), stage IV     a. Right brachiocephalic AV fistula 11/2669. Neph = Dunham.  . Vertigo   . Valvular heart disease     a. Echo 04/2013: mild AI, mild MR.  . Pulmonary HTN     a. PA pressure 29mmHg by echo 05/01/13.  . Prostate cancer     a. s/p radical prostatectomy.  . Colon cancer   . Skin cancer   . Presence of permanent cardiac pacemaker     Social History   Social History  . Marital Status: Widowed    Spouse Name: N/A  . Number of Children: 4  . Years of Education: N/A   Occupational History  . retired    Social History Main Topics  . Smoking status: Former Smoker    Types: Cigarettes    Quit date: 09/22/1966  . Smokeless tobacco: Never Used  . Alcohol Use: 0.0 - 0.6 oz/week    0-1 Standard drinks or equivalent per week     Comment: 05/07/2015 "might have  a couple drinks/month"  . Drug Use: No  . Sexual Activity: No   Other Topics Concern  . Not on file   Social History Narrative    Past Surgical History  Procedure Laterality Date  . Colectomy    . Prostatectomy    . Penile prosthesis placement    . Removed prosthetic eye    . Ptca      stent placed  . Total knee arthroplasty  2008    right  . Pacemaker insertion  2010  . Av fistula placement  03/30/2012    Procedure: ARTERIOVENOUS (AV) FISTULA CREATION;  Surgeon: Angelia Mould, MD;  Location: [facility];  Service: Vascular;  Laterality: Right;  Creation of BrachioCephalic Fistula Right arm  . Eye surgery      left eye prothesis  .  Fracture surgery      collar bone, right knee replacement  . Bi-ventricular implantable cardioverter defibrillator  (crt-d)  2010    STJ CRTD implanted by Dr [name]  . Ablation  08/2013    AVN ablation by Dr Lovena Le  . Av node ablation N/A 09/12/2013    Procedure: AV NODE ABLATION;  Surgeon: [name], MD;  Location: [facility] CATH LAB;  Service: Cardiovascular;  Laterality: N/A;  . Insert / replace / remove pacemaker    . Joint replacement    . Cardiac catheterization N/A 05/09/2015    Procedure: Left Heart Cath and Coronary Angiography;  Surgeon: Sherren Mocha, MD;  Location: [place] [facility] CV LAB;  Service: Cardiovascular;  Laterality: N/A;  . Cardiac catheterization N/A 05/16/2015    Procedure: Coronary Stent Intervention;  Surgeon: Sherren Mocha, MD;  Location: [facility] CV LAB;  Service: Cardiovascular;  Laterality: N/A;    Family History  Problem Relation Age of Onset  . Heart disease Father   . Throat cancer Father   . Throat cancer Mother   . Heart disease Mother   . Hypertension Mother     Allergies  Allergen Reactions  . Ace Inhibitors Other (See Comments)    Stopped by Dr. Burt Knack due to progressive renal failure  . Prednisone Other (See Comments)    Raised blood sugar to almost 900    Current Outpatient Prescriptions on File Prior to Visit  Medication Sig Dispense Refill  . acetaminophen (TYLENOL) 500 MG tablet Take 500 mg by mouth as needed for mild pain.     . calcitRIOL (ROCALTROL) 0.25 MCG capsule Take 0.25 mcg by mouth. Monday, Wed and Friday    . DIGOX 125 MCG tablet Take 1 tablet by mouth  every other day 45 tablet 1  . furosemide (LASIX) 80 MG tablet Take 1 tablet by mouth two  times daily 180 tablet 1  . glucose blood (FREESTYLE LITE) test strip 1 each by Other route 2 (two) times daily as needed for other. Dx: 250.00 100 each 12  . hydrALAZINE (APRESOLINE) 25 MG tablet Take 1 and 1/2 tablets by  mouth 3 times daily 315 tablet 0  . insulin lispro  protamine-lispro (HUMALOG 75/25 MIX) (75-25) 100 UNIT/ML SUSP injection Inject 22 Units into the skin 2 (two) times daily with a meal. (Patient taking differently: Inject 26 Units into the skin 2 (two) times daily with a meal. ) 6 vial 1  . isosorbide mononitrate (IMDUR) 30 MG 24 hr tablet Take 0.5 tablets (15 mg total) by mouth daily. 90 tablet 1  . Lancets (FREESTYLE) lancets 1 each by Other route 2 (two) times daily as needed  for other. 100 each 12  . metoprolol succinate (TOPROL-XL) 100 MG 24 hr tablet Take 1 tablet (100 mg total) by mouth 2 (two) times daily. Take with or immediately following a meal. 60 tablet 0  . potassium chloride SA (K-DUR,KLOR-CON) 20 MEQ tablet Take 1 tablet (20 mEq total) by mouth daily. (Patient taking differently: Take 20 mEq by mouth 2 (two) times daily. ) 180 tablet 3  . warfarin (COUMADIN) 5 MG tablet Take 2.5mg  (half tablet) Monday and Friday, 5mg  on all other days 30 tablet 2   No current facility-administered medications on file prior to visit.    BP 130/70 mmHg  Pulse 75  Temp(Src) 98.4 F (36.9 C) (Oral)  Resp 18  Ht 5\' 11"  (1.803 m)  Wt 174 lb (78.926 kg)  BMI 24.28 kg/m2  SpO2 98%   Hospital records reviewed  Review of Systems  Constitutional: Negative for fever, chills, appetite change and fatigue.  HENT: Negative for congestion, dental problem, ear pain, hearing loss, sore throat, tinnitus, trouble swallowing and voice change.   Eyes: Negative for pain, discharge and visual disturbance.  Respiratory: Negative for cough, chest tightness, wheezing and stridor.   Cardiovascular: Negative for chest pain, palpitations and leg swelling.  Gastrointestinal: Negative for nausea, vomiting, abdominal pain, diarrhea, constipation, blood in stool and abdominal distention.  Genitourinary: Negative for urgency, hematuria, flank pain, discharge, difficulty urinating and genital sores.  Musculoskeletal: Negative for myalgias, back pain, joint swelling,  arthralgias, gait problem and neck stiffness.  Skin: Negative for rash.  Neurological: Negative for dizziness, syncope, speech difficulty, weakness, numbness and headaches.  Hematological: Negative for adenopathy. Does not bruise/bleed easily.  Psychiatric/Behavioral: Negative for behavioral problems and dysphoric mood. The patient is not nervous/anxious.        Objective:   Physical Exam  Constitutional: He is oriented to person, place, and time. He appears well-developed.  HENT:  Head: Normocephalic.  Right Ear: External ear normal.  Left Ear: External ear normal.  Eyes: Conjunctivae and EOM are normal.  Neck: Normal range of motion.  Cardiovascular: Normal rate, regular rhythm and normal heart sounds.   Pulmonary/Chest: Breath sounds normal. No respiratory distress.  Abdominal: Bowel sounds are normal.  Musculoskeletal: Normal range of motion. He exhibits no edema or tenderness.  Neurological: He is alert and oriented to person, place, and time.  Skin:  Resolving ecchymoses right groin.  The patient did have a slightly tender nodule in the right groin that appeared to be medial and separate from the femoral artery.  This is felt to be a resolving hematoma and not a pseudoaneurysm  Psychiatric: He has a normal mood and affect. His behavior is normal.          Assessment & Plan:   Coronary artery disease status post PCI Diabetes mellitus.  Will check a hemoglobin A1c Hypertension Chronic kidney disease.  Will check renal indices Chronic anticoagulation.  Will check a PT/INR  Cardiology follow-up as scheduled Return here in 3 months

## 2015-05-22 NOTE — Patient Instructions (Signed)
Limit your sodium (Salt) intake    It is important that you exercise regularly, at least 20 minutes 3 to 4 times per week.  If you develop chest pain or shortness of breath seek  medical attention.   Please check your hemoglobin A1c every 3 months   

## 2015-05-29 ENCOUNTER — Telehealth: Payer: Self-pay | Admitting: Cardiovascular Disease

## 2015-05-29 NOTE — Telephone Encounter (Signed)
New Message       Pt calling stating that Dr. Burt Knack wants him to do cardiac rehab and that cardiac rehab has faxed paperwork to Dr. Burt Knack 3 times and has yet to receive a response. Please call pt back and advise.

## 2015-05-29 NOTE — Telephone Encounter (Signed)
I have order for Cardiac Rehab, pt advised I will ask Dr Burt Knack to sign tomorrow and then I will fax to Cardiac Rehab.

## 2015-05-30 NOTE — Telephone Encounter (Signed)
I faxed signed order for Cardiac Rehab to [facility], Thayer Headings at Cardiac Rehab confirmed order has been received at Cardiac Rehab.  Pt advised order faxed to Cardiac Rehab and that I had received confirmation that it was received by Cardiac Rehab.

## 2015-05-31 ENCOUNTER — Ambulatory Visit (INDEPENDENT_AMBULATORY_CARE_PROVIDER_SITE_OTHER): Payer: Medicare Other | Admitting: General Practice

## 2015-05-31 DIAGNOSIS — Z5181 Encounter for therapeutic drug level monitoring: Secondary | ICD-10-CM | POA: Diagnosis not present

## 2015-05-31 DIAGNOSIS — I482 Chronic atrial fibrillation, unspecified: Secondary | ICD-10-CM

## 2015-05-31 LAB — POCT INR: INR: 1.7

## 2015-05-31 NOTE — Progress Notes (Signed)
Pre visit review using our clinic review tool, if applicable. No additional management support is needed unless otherwise documented below in the visit note. 

## 2015-06-01 ENCOUNTER — Ambulatory Visit: Payer: Medicare Other | Admitting: Podiatry

## 2015-06-04 ENCOUNTER — Other Ambulatory Visit: Payer: Self-pay

## 2015-06-04 ENCOUNTER — Telehealth: Payer: Self-pay | Admitting: Internal Medicine

## 2015-06-04 ENCOUNTER — Inpatient Hospital Stay (HOSPITAL_COMMUNITY)
Admission: EM | Admit: 2015-06-04 | Discharge: 2015-06-06 | DRG: 292 | Disposition: A | Discharge status: Home / Self Care | Payer: Medicare Other | Attending: Internal Medicine | Admitting: Internal Medicine

## 2015-06-04 ENCOUNTER — Emergency Department (HOSPITAL_COMMUNITY): Payer: Medicare Other

## 2015-06-04 ENCOUNTER — Telehealth: Payer: Self-pay | Admitting: Cardiovascular Disease

## 2015-06-04 ENCOUNTER — Encounter (HOSPITAL_COMMUNITY): Payer: Self-pay | Admitting: *Deleted

## 2015-06-04 DIAGNOSIS — Z7901 Long term (current) use of anticoagulants: Secondary | ICD-10-CM | POA: Diagnosis not present

## 2015-06-04 DIAGNOSIS — N2581 Secondary hyperparathyroidism of renal origin: Secondary | ICD-10-CM | POA: Diagnosis present

## 2015-06-04 DIAGNOSIS — E1122 Type 2 diabetes mellitus with diabetic chronic kidney disease: Secondary | ICD-10-CM | POA: Diagnosis present

## 2015-06-04 DIAGNOSIS — E785 Hyperlipidemia, unspecified: Secondary | ICD-10-CM | POA: Diagnosis present

## 2015-06-04 DIAGNOSIS — Z955 Presence of coronary angioplasty implant and graft: Secondary | ICD-10-CM | POA: Diagnosis not present

## 2015-06-04 DIAGNOSIS — R0602 Shortness of breath: Secondary | ICD-10-CM | POA: Diagnosis not present

## 2015-06-04 DIAGNOSIS — Z85038 Personal history of other malignant neoplasm of large intestine: Secondary | ICD-10-CM

## 2015-06-04 DIAGNOSIS — Z85828 Personal history of other malignant neoplasm of skin: Secondary | ICD-10-CM

## 2015-06-04 DIAGNOSIS — Z8249 Family history of ischemic heart disease and other diseases of the circulatory system: Secondary | ICD-10-CM | POA: Diagnosis not present

## 2015-06-04 DIAGNOSIS — I482 Chronic atrial fibrillation, unspecified: Secondary | ICD-10-CM | POA: Diagnosis present

## 2015-06-04 DIAGNOSIS — R079 Chest pain, unspecified: Secondary | ICD-10-CM

## 2015-06-04 DIAGNOSIS — Z8546 Personal history of malignant neoplasm of prostate: Secondary | ICD-10-CM

## 2015-06-04 DIAGNOSIS — Z95810 Presence of automatic (implantable) cardiac defibrillator: Secondary | ICD-10-CM

## 2015-06-04 DIAGNOSIS — R778 Other specified abnormalities of plasma proteins: Secondary | ICD-10-CM | POA: Diagnosis present

## 2015-06-04 DIAGNOSIS — Z87891 Personal history of nicotine dependence: Secondary | ICD-10-CM | POA: Diagnosis not present

## 2015-06-04 DIAGNOSIS — I255 Ischemic cardiomyopathy: Secondary | ICD-10-CM | POA: Diagnosis present

## 2015-06-04 DIAGNOSIS — I120 Hypertensive chronic kidney disease with stage 5 chronic kidney disease or end stage renal disease: Secondary | ICD-10-CM | POA: Diagnosis present

## 2015-06-04 DIAGNOSIS — Z7902 Long term (current) use of antithrombotics/antiplatelets: Secondary | ICD-10-CM

## 2015-06-04 DIAGNOSIS — Z79899 Other long term (current) drug therapy: Secondary | ICD-10-CM

## 2015-06-04 DIAGNOSIS — Z970 Presence of artificial eye: Secondary | ICD-10-CM | POA: Diagnosis not present

## 2015-06-04 DIAGNOSIS — Z794 Long term (current) use of insulin: Secondary | ICD-10-CM | POA: Diagnosis not present

## 2015-06-04 DIAGNOSIS — Z96651 Presence of right artificial knee joint: Secondary | ICD-10-CM | POA: Diagnosis present

## 2015-06-04 DIAGNOSIS — I200 Unstable angina: Secondary | ICD-10-CM | POA: Diagnosis present

## 2015-06-04 DIAGNOSIS — Z4502 Encounter for adjustment and management of automatic implantable cardiac defibrillator: Secondary | ICD-10-CM

## 2015-06-04 DIAGNOSIS — I2510 Atherosclerotic heart disease of native coronary artery without angina pectoris: Secondary | ICD-10-CM | POA: Diagnosis not present

## 2015-06-04 DIAGNOSIS — G4733 Obstructive sleep apnea (adult) (pediatric): Secondary | ICD-10-CM | POA: Diagnosis present

## 2015-06-04 DIAGNOSIS — N185 Chronic kidney disease, stage 5: Secondary | ICD-10-CM | POA: Diagnosis present

## 2015-06-04 DIAGNOSIS — R7989 Other specified abnormal findings of blood chemistry: Secondary | ICD-10-CM | POA: Diagnosis present

## 2015-06-04 DIAGNOSIS — I272 Other secondary pulmonary hypertension: Secondary | ICD-10-CM | POA: Diagnosis present

## 2015-06-04 DIAGNOSIS — Z888 Allergy status to other drugs, medicaments and biological substances status: Secondary | ICD-10-CM

## 2015-06-04 DIAGNOSIS — I5023 Acute on chronic systolic (congestive) heart failure: Secondary | ICD-10-CM | POA: Diagnosis not present

## 2015-06-04 DIAGNOSIS — I25110 Atherosclerotic heart disease of native coronary artery with unstable angina pectoris: Secondary | ICD-10-CM | POA: Diagnosis present

## 2015-06-04 LAB — COMPREHENSIVE METABOLIC PANEL
ALK PHOS: 28 U/L — AB (ref 38–126)
ALT: 19 U/L (ref 17–63)
AST: 19 U/L (ref 15–41)
Albumin: 3.3 g/dL — ABNORMAL LOW (ref 3.5–5.0)
Anion gap: 10 (ref 5–15)
BILIRUBIN TOTAL: 0.8 mg/dL (ref 0.3–1.2)
BUN: 64 mg/dL — ABNORMAL HIGH (ref 6–20)
CALCIUM: 9.3 mg/dL (ref 8.9–10.3)
CO2: 23 mmol/L (ref 22–32)
CREATININE: 4.33 mg/dL — AB (ref 0.61–1.24)
Chloride: 108 mmol/L (ref 101–111)
GFR, EST AFRICAN AMERICAN: 13 mL/min — AB (ref >60)
GFR, EST NON AFRICAN AMERICAN: 12 mL/min — AB (ref >60)
Glucose, Bld: 111 mg/dL — ABNORMAL HIGH (ref 65–99)
Potassium: 3.7 mmol/L (ref 3.5–5.1)
Sodium: 141 mmol/L (ref 135–145)
TOTAL PROTEIN: 5.9 g/dL — AB (ref 6.5–8.1)

## 2015-06-04 LAB — TSH: TSH: 0.925 u[IU]/mL (ref 0.350–4.500)

## 2015-06-04 LAB — BRAIN NATRIURETIC PEPTIDE
B NATRIURETIC PEPTIDE 5: 1655.4 pg/mL — AB (ref 0.0–100.0)
B Natriuretic Peptide: 1099.9 pg/mL — ABNORMAL HIGH (ref 0.0–100.0)

## 2015-06-04 LAB — TROPONIN I
TROPONIN I: 0.07 ng/mL — AB (ref <0.031)
Troponin I: 0.06 ng/mL — ABNORMAL HIGH (ref <0.031)
Troponin I: 0.06 ng/mL — ABNORMAL HIGH (ref <0.031)

## 2015-06-04 LAB — GLUCOSE, CAPILLARY: Glucose-Capillary: 167 mg/dL — ABNORMAL HIGH (ref 65–99)

## 2015-06-04 LAB — CBC
HEMATOCRIT: 33.8 % — AB (ref 39.0–52.0)
HEMOGLOBIN: 11.2 g/dL — AB (ref 13.0–17.0)
MCH: 31.2 pg (ref 26.0–34.0)
MCHC: 33.1 g/dL (ref 30.0–36.0)
MCV: 94.2 fL (ref 78.0–100.0)
Platelets: 112 10*3/uL — ABNORMAL LOW (ref 150–400)
RBC: 3.59 MIL/uL — AB (ref 4.22–5.81)
RDW: 14 % (ref 11.5–15.5)
WBC: 6.3 10*3/uL (ref 4.0–10.5)

## 2015-06-04 LAB — PROTIME-INR
INR: 2.53 — ABNORMAL HIGH (ref 0.00–1.49)
PROTHROMBIN TIME: 27 s — AB (ref 11.6–15.2)

## 2015-06-04 LAB — MAGNESIUM: MAGNESIUM: 2.3 mg/dL (ref 1.7–2.4)

## 2015-06-04 MED ORDER — DIGOXIN 125 MCG PO TABS
0.1250 mg | ORAL_TABLET | Freq: Every day | ORAL | Status: DC
  Filled 2015-06-04: qty 1

## 2015-06-04 MED ORDER — ONDANSETRON HCL 4 MG/2ML IJ SOLN: 4.0000 mg | Freq: Four times a day (QID) | INTRAMUSCULAR | Status: DC | PRN

## 2015-06-04 MED ORDER — ASPIRIN 81 MG PO CHEW: 324.0000 mg | CHEWABLE_TABLET | Freq: Once | ORAL | Status: DC

## 2015-06-04 MED ORDER — FUROSEMIDE 10 MG/ML IJ SOLN: 40.0000 mg | Freq: Two times a day (BID) | INTRAMUSCULAR | Status: DC

## 2015-06-04 MED ORDER — POTASSIUM CHLORIDE CRYS ER 20 MEQ PO TBCR
20.0000 meq | EXTENDED_RELEASE_TABLET | Freq: Two times a day (BID) | ORAL | Status: DC
  Administered 2015-06-04: 20 meq via ORAL
  Filled 2015-06-04 (×2): qty 1

## 2015-06-04 MED ORDER — METOPROLOL SUCCINATE ER 100 MG PO TB24
100.0000 mg | ORAL_TABLET | Freq: Two times a day (BID) | ORAL | Status: DC
  Administered 2015-06-04 – 2015-06-05 (×2): 100 mg via ORAL
  Filled 2015-06-04 (×2): qty 1

## 2015-06-04 MED ORDER — NITROGLYCERIN 0.4 MG SL SUBL: 0.4000 mg | SUBLINGUAL_TABLET | SUBLINGUAL | Status: DC | PRN

## 2015-06-04 MED ORDER — ISOSORBIDE MONONITRATE ER 30 MG PO TB24
15.0000 mg | ORAL_TABLET | Freq: Every day | ORAL | Status: DC
  Administered 2015-06-05: 15 mg via ORAL
  Filled 2015-06-04: qty 1

## 2015-06-04 MED ORDER — ATORVASTATIN CALCIUM 40 MG PO TABS
40.0000 mg | ORAL_TABLET | Freq: Every day | ORAL | Status: DC
Start: 2015-06-04 — End: 2015-06-06
  Filled 2015-06-04: qty 1

## 2015-06-04 MED ORDER — ACETAMINOPHEN 500 MG PO TABS: 500.0000 mg | ORAL_TABLET | ORAL | Status: DC | PRN

## 2015-06-04 MED ORDER — DIGOXIN 125 MCG PO TABS: 0.1250 mg | ORAL_TABLET | ORAL | Status: DC

## 2015-06-04 MED ORDER — CALCITRIOL 0.25 MCG PO CAPS
0.2500 ug | ORAL_CAPSULE | ORAL | Status: DC
  Administered 2015-06-06: 0.25 ug via ORAL
  Filled 2015-06-04: qty 1

## 2015-06-04 MED ORDER — FUROSEMIDE 10 MG/ML IJ SOLN
80.0000 mg | Freq: Once | INTRAMUSCULAR | Status: AC
  Administered 2015-06-04: 80 mg via INTRAVENOUS
  Filled 2015-06-04: qty 8

## 2015-06-04 MED ORDER — ACETAMINOPHEN 325 MG PO TABS
650.0000 mg | ORAL_TABLET | ORAL | Status: DC | PRN
  Administered 2015-06-05: 650 mg via ORAL
  Filled 2015-06-04 (×2): qty 2

## 2015-06-04 MED ORDER — CLOPIDOGREL BISULFATE 75 MG PO TABS
75.0000 mg | ORAL_TABLET | Freq: Every day | ORAL | Status: DC
  Administered 2015-06-05 – 2015-06-06 (×2): 75 mg via ORAL
  Filled 2015-06-04 (×2): qty 1

## 2015-06-04 MED ORDER — HYDRALAZINE HCL 25 MG PO TABS
25.0000 mg | ORAL_TABLET | Freq: Three times a day (TID) | ORAL | Status: DC
  Administered 2015-06-04 – 2015-06-05 (×2): 25 mg via ORAL
  Filled 2015-06-04 (×2): qty 1

## 2015-06-04 MED ORDER — INSULIN ASPART PROT & ASPART (70-30 MIX) 100 UNIT/ML ~~LOC~~ SUSP
16.0000 [IU] | Freq: Every day | SUBCUTANEOUS | Status: DC
  Administered 2015-06-05 – 2015-06-06 (×2): 16 [IU] via SUBCUTANEOUS
  Filled 2015-06-04: qty 10

## 2015-06-04 MED ORDER — FUROSEMIDE 10 MG/ML IJ SOLN
40.0000 mg | Freq: Two times a day (BID) | INTRAMUSCULAR | Status: DC
  Administered 2015-06-05 – 2015-06-06 (×2): 40 mg via INTRAVENOUS
  Filled 2015-06-04 (×3): qty 4

## 2015-06-04 NOTE — Consult Note (Signed)
ANTICOAGULATION CONSULT NOTE - Initial Consult  Pharmacy Consult for Coumadin Indication: atrial fibrillation  Allergies  Allergen Reactions  . Ace Inhibitors Other (See Comments)    Stopped by Dr. Burt Knack due to progressive renal failure  . Prednisone Other (See Comments)    Raised blood sugar to almost 900    Vital Signs: Temp: 98.5 F (36.9 C) (09/12 1400) Temp Source: Oral (09/12 1400) BP: 146/67 mmHg (09/12 1500) Pulse Rate: 74 (09/12 1500)  Labs:  Recent Labs  06/04/15 1408  HGB 11.2*  HCT 33.8*  PLT 112*  LABPROT 27.0*  INR 2.53*  CREATININE 4.33*  TROPONINI 0.06*    Estimated Creatinine Clearance: 14 mL/min (by C-G formula based on Cr of 4.33).   Medical History: Past Medical History  Diagnosis Date  . Chronic atrial fibrillation     a. Historically difficult rate control. b. s/p CRT-D implantation with anticipation of AV junction ablation but patient then was able to accomplish rate control and ablation not undertaken.; AVN ablation 08/2013 by Dr Lovena Le  . CARDIOMYOPATHY, PRIMARY, DILATED     a. Mixed ICM/NICM - EF 15% by echo 05/2009; St. Jude CRT-D implanted 06/2009. b. Echo 04/2013 - EF 20%, mild LVH.  [name] Chronic systolic CHF (congestive heart failure)   . CORONARY ARTERY DISEASE     a. BMS to LAD 04/2008. b. Cath 06/2009: nonobstructive disease. c. cath 05/16/2015 DES to prox LAD  . SLEEP APNEA   . Hypertension   . Hyperlipidemia   . Diabetes mellitus   . Osteoarthritis   . Tubular adenoma of colon   . Secondary hyperparathyroidism   . CKD (chronic kidney disease), stage IV     a. Right brachiocephalic AV fistula 03/5101. Neph = Dunham.  . Vertigo   . Valvular heart disease     a. Echo 04/2013: mild AI, mild MR.  . Pulmonary HTN     a. PA pressure 44mmHg by echo 05/01/13.  . Prostate cancer     a. s/p radical prostatectomy.  . Colon cancer   . Skin cancer   . Presence of permanent cardiac pacemaker    Assessment: 84yom on coumadin pta for afib  being admitted with chest pain. Troponins negative thus far. Coumadin to resume. INR on admit is therapeutic at 2.53.   Home dose: 5mg  daily except 2.5mg  on Monday - last dose taken today  Goal of Therapy:  INR 2-3 Monitor platelets by anticoagulation protocol: Yes   Plan:  1) Doesn't need anymore coumadin today 2) Daily INR  Deboraha Sprang 06/04/2015,4:36 PM

## 2015-06-04 NOTE — ED Notes (Signed)
Dr. Ross at bedside

## 2015-06-04 NOTE — Telephone Encounter (Signed)
Pt needs the ok from  dr k before [facility] will release his warfarin 5 mg. They have switch manufacture. Pt does not know who?

## 2015-06-04 NOTE — ED Notes (Signed)
Per EMS- Pt woke this morning with CP and SOB. Pt received 324mg  asprin and 2 nitro. 3/10 decreased to 1/10 pain. Pt had stent placed 2 weeks ago.

## 2015-06-04 NOTE — ED Notes (Signed)
Thrill and bruit present in rt arm fistula

## 2015-06-04 NOTE — ED Provider Notes (Signed)
CSN: [id number]     Arrival date & time 06/04/15  1355 History   First MD Initiated Contact with Patient 06/04/15 1405     Chief Complaint  Patient presents with  . Shortness of Breath  . Chest Pain   Patient is a 81 y.o. male presenting with general illness. The history is provided by the patient. No language interpreter was used.  Illness Location:  Chest Quality:  Pain w/ SOB Onset quality:  Sudden Timing:  Constant Progression:  Unchanged Chronicity:  New Context:  PMHx of HTN, HLD, IDDM, CAD (stent x2 weeks ago), CHF (ICD in place), and CKD who presents w/ CP. Left-sided. Nonradiating. Onset after getting up this a.m. Constant nature. Associated with SOB. Denies nausea or diaphoresis. Has recent infectious symptoms. Coronary catheterization performed 2 weeks ago available above - stent to LAD place at that time. Associated symptoms: chest pain, cough, nausea and shortness of breath   Associated symptoms: no congestion, no diarrhea, no fever, no loss of consciousness and no rhinorrhea   Risk factors:  CAD, CHF w/ ICD placement    Past Medical History  Diagnosis Date  . Chronic atrial fibrillation     a. Historically difficult rate control. b. s/p CRT-D implantation with anticipation of AV junction ablation but patient then was able to accomplish rate control and ablation not undertaken.; AVN ablation 08/2013 by Dr Lovena Le  . CARDIOMYOPATHY, PRIMARY, DILATED     a. Mixed ICM/NICM - EF 15% by echo 05/2009; [employer] CRT-D implanted 06/2009. b. Echo 04/2013 - EF 20%, mild LVH.  [name] Chronic systolic CHF (congestive heart failure)   . CORONARY ARTERY DISEASE     a. BMS to LAD 04/2008. b. Cath 06/2009: nonobstructive disease. c. cath 05/16/2015 DES to prox LAD  . SLEEP APNEA   . Hypertension   . Hyperlipidemia   . Diabetes mellitus   . Osteoarthritis   . Tubular adenoma of colon   . Secondary hyperparathyroidism   . CKD (chronic kidney disease), stage IV     a. Right brachiocephalic AV  fistula 11/8327. Neph = [place].  . Vertigo   . Valvular heart disease     a. Echo 04/2013: mild AI, mild MR.  . Pulmonary HTN     a. PA pressure 48mmHg by echo 05/01/13.  . Prostate cancer     a. s/p radical prostatectomy.  . Colon cancer   . Skin cancer   . Presence of permanent cardiac pacemaker    Past Surgical History  Procedure Laterality Date  . Colectomy    . Prostatectomy    . Penile prosthesis placement    . Removed prosthetic eye    . Ptca      stent placed  . Total knee arthroplasty  2008    right  . Pacemaker insertion  2010  . Av fistula placement  03/30/2012    Procedure: ARTERIOVENOUS (AV) FISTULA CREATION;  Surgeon: Angelia Mould, MD;  Location: [facility];  Service: Vascular;  Laterality: Right;  Creation of BrachioCephalic Fistula Right arm  . Eye surgery      left eye prothesis  . Fracture surgery      collar bone, right knee replacement  . Bi-ventricular implantable cardioverter defibrillator  (crt-d)  2010    STJ CRTD implanted by Dr [name]  . Ablation  08/2013    AVN ablation by Dr Lovena Le  . Av node ablation N/A 09/12/2013    Procedure: AV NODE ABLATION;  Surgeon: [name],  MD;  Location: [facility];  Service: Cardiovascular;  Laterality: N/A;  . Insert / replace / remove pacemaker    . Joint replacement    . Cardiac catheterization N/A 05/09/2015    Procedure: Left Heart Cath and Coronary Angiography;  Surgeon: Sherren Mocha, MD;  Location: [facility];  Service: Cardiovascular;  Laterality: N/A;  . Cardiac catheterization N/A 05/16/2015    Procedure: Coronary Stent Intervention;  Surgeon: Sherren Mocha, MD;  Location: [facility];  Service: Cardiovascular;  Laterality: N/A;   Family History  Problem Relation Age of Onset  . Heart disease Father   . Throat cancer Father   . Throat cancer Mother   . Heart disease Mother   . Hypertension Mother    Social History  Substance Use Topics  . Smoking status: Former Smoker    Types:  Cigarettes    Quit date: 09/22/1966  . Smokeless tobacco: Never Used  . Alcohol Use: 0.0 - 0.6 oz/week    0-1 Standard drinks or equivalent per week     Comment: 05/07/2015 "might have a couple drinks/month"    Review of Systems  Constitutional: Negative for fever, chills and appetite change.  HENT: Negative for congestion and rhinorrhea.   Respiratory: Positive for cough and shortness of breath.   Cardiovascular: Positive for chest pain.  Gastrointestinal: Positive for nausea. Negative for diarrhea.  Neurological: Negative for loss of consciousness.  All other systems reviewed and are negative.   Allergies  Ace inhibitors and Prednisone  Home Medications   Prior to Admission medications   Medication Sig Start Date End Date Taking? Authorizing Provider  acetaminophen (TYLENOL) 500 MG tablet Take 500 mg by mouth as needed for mild pain.     Historical Provider, MD  calcitRIOL (ROCALTROL) 0.25 MCG capsule Take 0.25 mcg by mouth. Monday, Wed and Friday 12/27/14   Historical Provider, MD  clopidogrel (PLAVIX) 75 MG tablet Take 1 tablet (75 mg total) by mouth daily. 05/22/15   [name], MD  Calcium 125 MCG tablet Take 1 tablet by mouth  every other day 12/04/14   [name], MD  furosemide (LASIX) 80 MG tablet Take 1 tablet by mouth two  times daily 12/04/14   [name], MD  glucose blood (FREESTYLE LITE) test strip 1 each by Other route 2 (two) times daily as needed for other. Dx: 250.00 07/13/14   [name], MD  hydrALAZINE (APRESOLINE) 25 MG tablet Take 1 and 1/2 tablets by  mouth 3 times daily 04/09/15   [name], MD  insulin lispro protamine-lispro (HUMALOG 75/25 MIX) (75-25) 100 UNIT/ML SUSP injection Inject 22 Units into the skin 2 (two) times daily with a meal. Patient taking differently: Inject 26 Units into the skin 2 (two) times daily with a meal.  05/10/15   Velvet Bathe, MD  isosorbide mononitrate (IMDUR) 30 MG 24 hr tablet Take 0.5  tablets (15 mg total) by mouth daily. 09/12/14   [name], MD  Lancets (FREESTYLE) lancets 1 each by Other route 2 (two) times daily as needed for other. 02/22/14   [name], MD  metoprolol succinate (TOPROL-XL) 100 MG 24 hr tablet Take 1 tablet (100 mg total) by mouth 2 (two) times daily. Take with or immediately following a meal. 05/10/15   Velvet Bathe, MD  potassium chloride SA (K-DUR,KLOR-CON) 20 MEQ tablet Take 1 tablet (20 mEq total) by mouth daily. Patient taking differently: Take 20 mEq by mouth  2 (two) times daily.  05/10/15   Velvet Bathe, MD  warfarin (COUMADIN) 5 MG tablet Take 2.$RemoveBefor'5mg'LHGZNyMiscLZ$  (half tablet) Monday and Friday, $RemoveBef'5mg'EaViDliazp$  on all other days 05/17/15   Almyra Deforest, PA   BP 146/67 mmHg  Pulse 74  Temp(Src) 98.5 F (36.9 C) (Oral)  Resp 19  SpO2 96%   Physical Exam  Constitutional: He is oriented to person, place, and time. He appears well-developed and well-nourished. No distress.  HENT:  Head: Normocephalic and atraumatic.  Eyes: Conjunctivae are normal. Pupils are equal, round, and reactive to light.  Neck: Normal range of motion. Neck supple.  Cardiovascular: Normal rate, regular rhythm and intact distal pulses.   ICD in place to left superior chest, no overlying erythema or induration or fluctuance or tenderness to palpation, no tenderness to palpation over left lateral chest at site of pain.   Pulmonary/Chest: Effort normal and breath sounds normal.  Abdominal: Soft. Bowel sounds are normal. He exhibits no distension. There is no tenderness. There is no rebound.  Musculoskeletal: Normal range of motion.  AV fistula to RUE with palpable thrill  Neurological: He is alert and oriented to person, place, and time.  Skin: Skin is warm and dry. He is not diaphoretic.  Nursing note and vitals reviewed.   ED Course  Procedures   HEART CATH (05/16/15) INDICATION: Progressive dyspnea with NYHA Class 3 symptoms, severe proximal LAD stenosis, high-risk noninvasive  findings with severe LV dysfunction LVEF < 30%, maximal medical therapy on 2 antianginal drugs (imdur/metoprolol)  PROCEDURAL DETAILS:  The right groin was prepped, draped, and anesthetized with 1% lidocaine. Using modified Seldinger technique, a 6 French sheath was introduced into the right femoral artery. An XB LAD 3.5 cm guide catheter was utilized. Because of advanced kidney disease, heparin was used for anticoagulation. The patient has been adequately preloaded with clopidogrel. A cougar guidewire was passed beyond the heavily calcified 75% stenosis in the proximal LAD. The lesion is pretreated with a 3.0 x 10 mm Angiosculpt balloon dilated to 12 atm. the lesion was then stented with a 3.5 x 15 mm resolute drug-eluting stent deployed at 16 atm. The stent was postdilated with a 3.75 noncompliant balloon to 16 atm on 2 inflations. At the completion of the procedure there is 0% residual stenosis and TIMI-3 flow. In one view there is a very faint linear defect at the superior aspect of the distal stent edge. By angiographic assessment this is not flow limiting. In another view the small defect is not seen at all. Overall I think this is an excellent result with a widely patent stent and good stent expansion. A Perclose devices used for femoral hemostasis. There were no immediate procedural complications. The patient was transferred to the post catheterization recovery area for further monitoring. Conclusion  Ost LAD to Prox LAD lesion, 75% stenosed. There is a 0% residual stenosis post intervention. A drug-eluting stent was placed. Successful PCI of the proximal LAD with drug-eluting stent implantation and scoring balloon angioplasty Recommend Plavix 75 mg for at least 6 months. Warfarin will be restarted tonight.  Coronary Findings  Dominance: Right Left Anterior Descending Ost LAD to Prox LAD lesion, 75% stenosed. calcified diffuse .   PCI: The pre-interventional distal flow is normal (TIMI 3).  Pre-stent angioplasty was performed. A drug-eluting stent was placed. Post-stent angioplasty was performed. Maximum pressure: 16 atm. The post-interventional distal flow is normal (TIMI 3). The intervention was successful. No complications occurred at this lesion. There is no residual stenosis post  intervention.   Labs Review Labs Reviewed  CBC - Abnormal; Notable for the following:    RBC 3.59 (*)    Hemoglobin 11.2 (*)    HCT 33.8 (*)    Platelets 112 (*)    All other components within normal limits  COMPREHENSIVE METABOLIC PANEL - Abnormal; Notable for the following:    Glucose, Bld 111 (*)    BUN 64 (*)    Creatinine, Ser 4.33 (*)    Total Protein 5.9 (*)    Albumin 3.3 (*)    Alkaline Phosphatase 28 (*)    GFR calc non Af Amer 12 (*)    GFR calc Af Amer 13 (*)    All other components within normal limits  PROTIME-INR - Abnormal; Notable for the following:    Prothrombin Time 27.0 (*)    INR 2.53 (*)    All other components within normal limits  TROPONIN I - Abnormal; Notable for the following:    Troponin I 0.06 (*)    All other components within normal limits  MAGNESIUM  BRAIN NATRIURETIC PEPTIDE   Imaging Review Dg Chest 2 View  06/04/2015   CLINICAL DATA:  Left chest pain and shortness of breath today.  EXAM: CHEST  2 VIEW  COMPARISON:  May 07, 2015  FINDINGS: The heart size and mediastinal contours are stable. The heart size is enlarged. Cardiac pacemaker is unchanged. The lungs are hyperinflated. There is mild atelectasis of the right lung base. There is no focal pneumonia, pulmonary edema. There probable minimal bilateral pleural effusions. The visualized skeletal structures are stable.  IMPRESSION: No focal pneumonia. Probable minimal bilateral pleural effusions. Atelectasis of right lung base. Emphysema.   Electronically Signed   By: Abelardo Diesel M.D.   On: 06/04/2015 14:52   I have personally reviewed and evaluated these images and lab results as part of my  medical decision-making.   EKG Interpretation None      MDM  Mr. Punt is an 81 yo male w/ PMHx of HTN, HLD, IDDM, CAD (stent x2 weeks ago), CHF (ICD in place), and CKD5 (AV fistula placed this year) who presents w/ CP. Left-sided. Nonradiating. Onset after getting up this AM. Constant nature. Associated with SOB. Nausea following ASA dose. Denies diaphoresis. Has recent infectious symptoms. Coronary catheterization performed 2 weeks ago available above - stent to LAD place at that time.  Exam above notable for elderly male lying in stretcher in no acute distress. Afebrile. Heart rate 70s. Mildly hypertensive with SBP's in 140s. Not tachypneic. Breathing well on room air and maintainin without supplemental oxygen. No tenderness to palpation over ICD site. Lungs CTAB. ICD in place to left superior chest, no overlying erythema or induration or fluctuance or tenderness to palpation, no tenderness to palpation over left lateral chest at site of pain. AV fistula to RUE with palpable thrill.  EKG showing ventricular paced rhythm with no obvious ST elevation/depression. Troponin 0.06. CBC unremarkable. CMP showing Cr 4.33 (appears to be patient's baseline). Potassium 3.7. INR 2.53. CXR w/o evidence of focal consolidation but minimal pleural effusions bilaterally and mild atelectasis of RLL as well as chronic emphysema.  Cardiology consulted and evaluated the patient in the ED with recommendations to admit patient for further evaluation and management of ACS versus CHF exacerbation. Patient understands and agrees with the plan and has no further questions or concerns this time.  Patient care discussed with and followed by my attending, Dr. Carmin Muskrat.   Final diagnoses:  Left  sided chest pain  SOB (shortness of breath)    Mayer Camel, MD 06/04/15 1645  Carmin Muskrat, MD 06/07/15 [phone]

## 2015-06-04 NOTE — ED Notes (Signed)
Attempted report 

## 2015-06-04 NOTE — Telephone Encounter (Signed)
Pt c/o of Chest Pain: STAT if CP now or developed within 24 hours  1. Are you having CP right now? Yes  2. Are you experiencing any other symptoms (ex. SOB, nausea, vomiting, sweating)? SOB  3. How long have you been experiencing CP? This morning  4. Is your CP continuous or coming and going? sitting  5. Have you taken Nitroglycerin? No   Pt had a stent put in 2wks ago ?

## 2015-06-04 NOTE — ED Notes (Signed)
MD at bedside. 

## 2015-06-04 NOTE — ED Notes (Signed)
Meal tray ordered 

## 2015-06-04 NOTE — Telephone Encounter (Signed)
Patient called with active chest pain, SOB, and hacking cough that started this morning.  He thought it was indigestion. He ate breakfast and the pain went away, but shortly after the pain returned and has not subsided with rest. CP 3-4/10 and described as a "constant, dull ache." There is an occasional throb that makes the pain worse. He st the pain does not radiate. Patient st he is constantly SOB, but it is worse on exertion. He st he can "barely breathe" when he walks.  The "hacking cough" started after his chest pain started.  On the phone the patient is not audibly SOB, but he st he "can tell his breathing is different." He is hesitant to walk anywhere because his "breathing will get worse." The patient c/o no other symptoms, but complained several times on the phone about his chest pain. Instructed patient to go to the ED for further assessment due to active chest pain and SOB.  Patient agrees with treatment plan.

## 2015-06-04 NOTE — H&P (Addendum)
Reason for Consult: chest pain   Referring Physician: ER MD   PCP:  Nyoka Cowden, MD  Primary Cardiologist:Dr. Lenell Antu R Aryaan Persichetti. is an 81 y.o. male.    Chief Complaint:  Chest pain x.     He has a PMH of CAD  chronic atrial fibrillation ( s/p AVN ablation) and CRT-D implantation, chronic systolic HF with mixed ICM/NICM, CKD stage IV-V (AV fistula placed on 03/2012), HTN, HLD, DM, secondary hyperparathyroidism, pulm HTN, OSA (on CPAP)  He presented to [facility] on 05/07/2015 as a transfer from [place] with dyspnea and malaise. On presentation, he also endorsed dyspnea with minimal exertion. His symptom was felt to be anginal equivalent. Nephrology was consulted given significant CKD. Although he does have right brachial AVF placed, however he never started on hemodialysis. He underwent diagnostic cardiac catheterization on 05/09/2015 which showed a 75% proximal LAD calcified lesion, 80% small D1 lesion, 25% proximal to mid LAD lesion, 50% mid RCA lesion.  He was readmitted on 8/24 and underwent PTCA/stent placement to the proximal LAD. Sent home on Plavix and coumadin   The patient says that after discharge he felt very good  Breathing was good  He could walk more without giving out  No chest pressure   Today he developed chest pain with SOB. This was upon waking  After breakfast chst discomfort resolved but returned at lunch.  The SOB has been more constant He also notes lower extremity edema      He took ASA and NTG with not much improvement in symtpoms.    EKG with pacing.  Troponin 0.06.  Last INR 1.7 on the 8th.  Today 2.53.  He has not missed any Plavix.  Prior to today he has not had any problems, going to ball games, climbing on bleachers.   Echo in [id number] Study Conclusions  - Left ventricle: The cavity size was mildly dilated. Wall thickness was increased in a pattern of mild LVH. Systolic function was severely  reduced. The estimated ejection fraction was in the range of 25% to 30%. Diffuse hypokinesis. - Aortic valve: Sclerosis without stenosis. There was mild regurgitation. - Mitral valve: There was mild regurgitation. - Left atrium: The atrium was mildly dilated. - Right atrium: The atrium was mildly dilated. - Pulmonary arteries: PA peak pressure: 48 mm Hg (S).  Cardiac Cath 05/09/15  Mid RCA lesion, 50% stenosed.  Prox LAD lesion, 75% stenosed.  Ost 1st Diag to 1st Diag lesion, 80% stenosed.  Prox LAD to Mid LAD lesion, 25% stenosed. A bare metal stent was placed. The lesion was previously treated with a bare metal stent .  1. SEVERE PROXIMAL LAD STENOSIS 2. SEVERE DIAGONAL BRANCH STENOSIS (SMALL VESSEL) 3. NONOBSTRUCTIVE LCX AND RCA STENOSES 4. PRESERVED CARDIAC OUTPUT AND WELL-COMPENSATED HEMODYNAMICS  RECOMMEND: STAGED PCI OF THE LAD (STAGED PROCEDURE BECAUSE OF ADVANCED KIDNEY DISEASE). WHILE THE PROXIMAL LAD HAS SIGNIFICANT STENOSIS, THE LESION APPEARS NON-CRITICAL AND IT MAY BE BEST TO STAGE THE PCI PROCEDURE FOR APPROXIMATELY 1 WEEK TO MINIMIZE RENAL RISK. WOULD HOLD WARFARIN AND START PLAVIX. RESUME WARFARIN FOLLOWING PCI.   05/16/15- PCI  Ost LAD to Prox LAD lesion, 75% stenosed. There is a 0% residual stenosis post intervention.  A drug-eluting stent was placed.   Past Medical History  Diagnosis Date  . Chronic atrial fibrillation     a. Historically difficult rate control. b. s/p CRT-D implantation with anticipation of  AV junction ablation but patient then was able to accomplish rate control and ablation not undertaken.; AVN ablation 08/2013 by Dr Ladona Ridgel  . CARDIOMYOPATHY, PRIMARY, DILATED     a. Mixed ICM/NICM - EF 15% by echo 05/2009; St. Jude CRT-D implanted 06/2009. b. Echo 04/2013 - EF 20%, mild LVH.  [name] Chronic systolic CHF (congestive heart failure)   . CORONARY ARTERY DISEASE     a. BMS to LAD 04/2008. b. Cath 06/2009: nonobstructive disease. c. cath  05/16/2015 DES to prox LAD  . SLEEP APNEA   . Hypertension   . Hyperlipidemia   . Diabetes mellitus   . Osteoarthritis   . Tubular adenoma of colon   . Secondary hyperparathyroidism   . CKD (chronic kidney disease), stage IV     a. Right brachiocephalic AV fistula 03/2012. Neph = Dunham.  . Vertigo   . Valvular heart disease     a. Echo 04/2013: mild AI, mild MR.  . Pulmonary HTN     a. PA pressure by echo 05/01/13.  . Prostate cancer     a. s/p radical prostatectomy.  . Colon cancer   . Skin cancer   . Presence of permanent cardiac pacemaker     Past Surgical History  Procedure Laterality Date  . Colectomy    . Prostatectomy    . Penile prosthesis placement    . Removed prosthetic eye    . Ptca      stent placed  . Total knee arthroplasty  2008    right  . Pacemaker insertion  2010  . Av fistula placement  03/30/2012    Procedure: ARTERIOVENOUS (AV) FISTULA CREATION;  Surgeon: Chuck Hint, MD;  Location: [facility] OR;  Service: Vascular;  Laterality: Right;  Creation of BrachioCephalic Fistula Right arm  . Eye surgery      left eye prothesis  . Fracture surgery      collar bone, right knee replacement  . Bi-ventricular implantable cardioverter defibrillator  (crt-d)  2010    STJ CRTD implanted by Dr Graciela Husbands  . Ablation  08/2013    AVN ablation by Dr Ladona Ridgel  . Av node ablation N/A 09/12/2013    Procedure: AV NODE ABLATION;  Surgeon: Marinus Maw, MD;  Location: [facility] CATH LAB;  Service: Cardiovascular;  Laterality: N/A;  . Insert / replace / remove pacemaker    . Joint replacement    . Cardiac catheterization N/A 05/09/2015    Procedure: Left Heart Cath and Coronary Angiography;  Surgeon: Tonny Bollman, MD;  Location: [facility] INVASIVE CV LAB;  Service: Cardiovascular;  Laterality: N/A;  . Cardiac catheterization N/A 05/16/2015    Procedure: Coronary Stent Intervention;  Surgeon: Tonny Bollman, MD;  Location: [facility] INVASIVE CV LAB;  Service: Cardiovascular;  Laterality: N/A;     Family History  Problem Relation Age of Onset  . Heart disease Father   . Throat cancer Father   . Throat cancer Mother   . Heart disease Mother   . Hypertension Mother    Social History:  reports that he quit smoking about 48 years ago. His smoking use included Cigarettes. He has never used smokeless tobacco. He reports that he drinks alcohol. He reports that he does not use illicit drugs.  Allergies:  Allergies  Allergen Reactions  . Ace Inhibitors Other (See Comments)    Stopped by Dr. Excell Seltzer due to progressive renal failure  . Prednisone Other (See Comments)    Raised blood sugar to almost 900  OUTPATIENT MEDICATIONS: No current facility-administered medications on file prior to encounter.   Current Outpatient Prescriptions on File Prior to Encounter  Medication Sig Dispense Refill  . acetaminophen (TYLENOL) 500 MG tablet Take 500 mg by mouth as needed for mild pain.     . calcitRIOL (ROCALTROL) 0.25 MCG capsule Take 0.25 mcg by mouth. Monday, Wed and Friday    . clopidogrel (PLAVIX) 75 MG tablet Take 1 tablet (75 mg total) by mouth daily. 90 tablet 2  . DIGOX 125 MCG tablet Take 1 tablet by mouth  every other day 45 tablet 1  . furosemide (LASIX) 80 MG tablet Take 1 tablet by mouth two  times daily 180 tablet 1  . glucose blood (FREESTYLE LITE) test strip 1 each by Other route 2 (two) times daily as needed for other. Dx: 250.00 100 each 12  . hydrALAZINE (APRESOLINE) 25 MG tablet Take 1 and 1/2 tablets by  mouth 3 times daily 315 tablet 0  . insulin lispro protamine-lispro (HUMALOG 75/25 MIX) (75-25) 100 UNIT/ML SUSP injection Inject 22 Units into the skin 2 (two) times daily with a meal. (Patient taking differently: Inject 26 Units into the skin 2 (two) times daily with a meal. ) 6 vial 1  . isosorbide mononitrate (IMDUR) 30 MG 24 hr tablet Take 0.5 tablets (15 mg total) by mouth daily. 90 tablet 1  . Lancets (FREESTYLE) lancets 1 each by Other route 2 (two) times daily  as needed for other. 100 each 12  . metoprolol succinate (TOPROL-XL) 100 MG 24 hr tablet Take 1 tablet (100 mg total) by mouth 2 (two) times daily. Take with or immediately following a meal. 60 tablet 0  . potassium chloride SA (K-DUR,KLOR-CON) 20 MEQ tablet Take 1 tablet (20 mEq total) by mouth daily. (Patient taking differently: Take 20 mEq by mouth 2 (two) times daily. ) 180 tablet 3  . warfarin (COUMADIN) 5 MG tablet Take 2.$RemoveBefor'5mg'xHjqrkIGLznA$  (half tablet) Monday and Friday, $RemoveBef'5mg'PCBDdROzpI$  on all other days 30 tablet 2     Results for orders placed or performed during the hospital encounter of 06/04/15 (from the past 48 hour(s))  CBC     Status: Abnormal   Collection Time: 06/04/15  2:08 PM  Result Value Ref Range   WBC 6.3 4.0 - 10.5 K/uL   RBC 3.59 (L) 4.22 - 5.81 MIL/uL   Hemoglobin 11.2 (L) 13.0 - 17.0 g/dL   HCT 33.8 (L) 39.0 - 52.0 %   MCV 94.2 78.0 - 100.0 fL   MCH 31.2 26.0 - 34.0 pg   MCHC 33.1 30.0 - 36.0 g/dL   RDW 14.0 11.5 - 15.5 %   Platelets 112 (L) 150 - 400 K/uL    Comment: PLATELET COUNT CONFIRMED BY SMEAR  Comprehensive metabolic panel     Status: Abnormal   Collection Time: 06/04/15  2:08 PM  Result Value Ref Range   Sodium 141 135 - 145 mmol/L   Potassium 3.7 3.5 - 5.1 mmol/L   Chloride 108 101 - 111 mmol/L   CO2 23 22 - 32 mmol/L   Glucose, Bld 111 (H) 65 - 99 mg/dL   BUN 64 (H) 6 - 20 mg/dL   Creatinine, Ser 4.33 (H) 0.61 - 1.24 mg/dL   Calcium 9.3 8.9 - 10.3 mg/dL   Total Protein 5.9 (L) 6.5 - 8.1 g/dL   Albumin 3.3 (L) 3.5 - 5.0 g/dL   AST 19 15 - 41 U/L   ALT 19 17 - 63 U/L  Alkaline Phosphatase 28 (L) 38 - 126 U/L   Total Bilirubin 0.8 0.3 - 1.2 mg/dL   GFR calc non Af Amer 12 (L) >60 mL/min   GFR calc Af Amer 13 (L) >60 mL/min    Comment: (NOTE) The eGFR has been calculated using the CKD EPI equation. This calculation has not been validated in all clinical situations. eGFR's persistently <60 mL/min signify possible Chronic Kidney Disease.    Anion gap 10 5 - 15    Magnesium     Status: None   Collection Time: 06/04/15  2:08 PM  Result Value Ref Range   Magnesium 2.3 1.7 - 2.4 mg/dL  Protime-INR     Status: Abnormal   Collection Time: 06/04/15  2:08 PM  Result Value Ref Range   Prothrombin Time 27.0 (H) 11.6 - 15.2 seconds   INR 2.53 (H) 0.00 - 1.49  Troponin I (order at [facility])     Status: Abnormal   Collection Time: 06/04/15  2:08 PM  Result Value Ref Range   Troponin I 0.06 (H) <0.031 ng/mL    Comment:        PERSISTENTLY INCREASED TROPONIN VALUES IN THE RANGE OF 0.04-0.49 ng/mL CAN BE SEEN IN:       -UNSTABLE ANGINA       -CONGESTIVE HEART FAILURE       -MYOCARDITIS       -CHEST TRAUMA       -ARRYHTHMIAS       -LATE PRESENTING MYOCARDIAL INFARCTION       -COPD   CLINICAL FOLLOW-UP RECOMMENDED.    Dg Chest 2 View  06/04/2015   CLINICAL DATA:  Left chest pain and shortness of breath today.  EXAM: CHEST  2 VIEW  COMPARISON:  May 07, 2015  FINDINGS: The heart size and mediastinal contours are stable. The heart size is enlarged. Cardiac pacemaker is unchanged. The lungs are hyperinflated. There is mild atelectasis of the right lung base. There is no focal pneumonia, pulmonary edema. There probable minimal bilateral pleural effusions. The visualized skeletal structures are stable.  IMPRESSION: No focal pneumonia. Probable minimal bilateral pleural effusions. Atelectasis of right lung base. Emphysema.   Electronically Signed   By: Abelardo Diesel M.D.   On: 06/04/2015 14:52    ROS: General:no colds or fevers, + weight increase 6 lbs since 05/10/15 Skin:no rashes or ulcers HEENT:no blurred vision, no congestion CV:see HPI PUL:see HPI GI:no diarrhea constipation or melena, no indigestion GU:no hematuria, no dysuria MS:no joint pain, no claudication Neuro:no syncope, no lightheadedness Endo:+ diabetes, no thyroid disease   Blood pressure 146/67, pulse 74, temperature 98.5 F (36.9 C), temperature source Oral, resp. rate 19, SpO2 96 %.  Wt  Readings from Last 3 Encounters:  05/22/15 174 lb (78.926 kg)  05/17/15 171 lb 4.8 oz (77.7 kg)  05/10/15 168 lb 3.4 oz (76.3 kg)    PE: General:Pleasant affect, NAD Skin:Warm and dry, brisk capillary refill HEENT:normocephalic, sclera clear, mucus membranes moist Neck:supple, + JVD to jaw when pt at 20 degrees, no bruits  Heart:S1S2 RRR without murmur, gallup, rub or click Lungs:Mild rales at bases   NVB:TYOM, non tender, + BS, do not palpate liver spleen or masses Ext:1+ lower ext edema- new today, 2+ pedal pulses, 2+ radial pulses Neuro:alert and oriented X 3, MAE, follows commands, + facial symmetry   EKG :  Atrial fib  Paced  75 bpm    Assessment/Plan   1.  Chest pressure/ SOB   Pt with known  CHF  REcent intervention. Presents with chest pressure and SOB   ON exam volume appears to be increased.  Deneis recent change in diet   Labs signif for BNP of 1100.  Cr 4.33 (baseline 4.2-4.4) EKG with pacing but no ST elevatoin I do not think pts symptoms represent stent problems but instead may be due to volume increase in setting of LV dysfunction and renal insuff.  He has residual narrowing in diagonal but I do nto think this is causing symptoms as he had it on last discharge  I would recomm diuresing with IV lasix  Follow response    2. Acute on chronic systolic CHF As above  Pt with BiV ICD  3  CAD  As above  Follow  Recent stent  Continue Plavix  4.  HL  NOt on a statin  WIll check lipds start lipitor 40    5.  CKD Stage IV  Follow with diuresis.  6  Atrial fib  Chronic  Rate controlled  Hold coumadin tonight in case of intervention  Otherwise resume.    [place]  Nurse Practitioner Certified [place] Pager [phone] or after 5pm or weekends call [phone] 06/04/2015, 3:43 PM  Pt seen and examined  I have amended note to reflect my findings.    Dorris Carnes

## 2015-06-05 DIAGNOSIS — Z4502 Encounter for adjustment and management of automatic implantable cardiac defibrillator: Secondary | ICD-10-CM

## 2015-06-05 DIAGNOSIS — R7989 Other specified abnormal findings of blood chemistry: Secondary | ICD-10-CM

## 2015-06-05 DIAGNOSIS — I255 Ischemic cardiomyopathy: Secondary | ICD-10-CM

## 2015-06-05 DIAGNOSIS — E785 Hyperlipidemia, unspecified: Secondary | ICD-10-CM

## 2015-06-05 LAB — CBC
HEMATOCRIT: 35 % — AB (ref 39.0–52.0)
HEMOGLOBIN: 11.3 g/dL — AB (ref 13.0–17.0)
MCH: 30.5 pg (ref 26.0–34.0)
MCHC: 32.3 g/dL (ref 30.0–36.0)
MCV: 94.3 fL (ref 78.0–100.0)
PLATELETS: 99 10*3/uL — AB (ref 150–400)
RBC: 3.71 MIL/uL — AB (ref 4.22–5.81)
RDW: 14 % (ref 11.5–15.5)
WBC: 6 10*3/uL (ref 4.0–10.5)

## 2015-06-05 LAB — PROTIME-INR
INR: 2.32 — ABNORMAL HIGH (ref 0.00–1.49)
Prothrombin Time: 25.2 seconds — ABNORMAL HIGH (ref 11.6–15.2)

## 2015-06-05 LAB — GLUCOSE, CAPILLARY
GLUCOSE-CAPILLARY: 184 mg/dL — AB (ref 65–99)
GLUCOSE-CAPILLARY: 146 mg/dL — AB (ref 65–99)
Glucose-Capillary: 142 mg/dL — ABNORMAL HIGH (ref 65–99)
Glucose-Capillary: 169 mg/dL — ABNORMAL HIGH (ref 65–99)

## 2015-06-05 LAB — DIGOXIN LEVEL: Digoxin Level: 0.3 ng/mL — ABNORMAL LOW (ref 0.8–2.0)

## 2015-06-05 LAB — BASIC METABOLIC PANEL
ANION GAP: 13 (ref 5–15)
BUN: 68 mg/dL — ABNORMAL HIGH (ref 6–20)
CO2: 19 mmol/L — AB (ref 22–32)
Calcium: 9.1 mg/dL (ref 8.9–10.3)
Chloride: 108 mmol/L (ref 101–111)
Creatinine, Ser: 4.56 mg/dL — ABNORMAL HIGH (ref 0.61–1.24)
GFR calc Af Amer: 13 mL/min — ABNORMAL LOW (ref >60)
GFR calc non Af Amer: 11 mL/min — ABNORMAL LOW (ref >60)
GLUCOSE: 145 mg/dL — AB (ref 65–99)
POTASSIUM: 3.7 mmol/L (ref 3.5–5.1)
Sodium: 140 mmol/L (ref 135–145)

## 2015-06-05 LAB — TROPONIN I: Troponin I: 0.07 ng/mL — ABNORMAL HIGH (ref <0.031)

## 2015-06-05 MED ORDER — HYDRALAZINE HCL 25 MG PO TABS
37.5000 mg | ORAL_TABLET | Freq: Three times a day (TID) | ORAL | Status: DC
  Administered 2015-06-05 – 2015-06-06 (×3): 37.5 mg via ORAL
  Filled 2015-06-05 (×3): qty 2

## 2015-06-05 MED ORDER — METOPROLOL SUCCINATE ER 100 MG PO TB24
100.0000 mg | ORAL_TABLET | Freq: Two times a day (BID) | ORAL | Status: DC
  Administered 2015-06-05 – 2015-06-06 (×2): 100 mg via ORAL
  Filled 2015-06-05 (×2): qty 1

## 2015-06-05 MED ORDER — ISOSORBIDE MONONITRATE ER 60 MG PO TB24: 60.0000 mg | ORAL_TABLET | Freq: Every day | ORAL | Status: DC

## 2015-06-05 MED ORDER — DIGOXIN 125 MCG PO TABS
0.1250 mg | ORAL_TABLET | ORAL | Status: DC
  Administered 2015-06-05: 0.125 mg via ORAL
  Filled 2015-06-05: qty 1

## 2015-06-05 MED ORDER — POTASSIUM CHLORIDE CRYS ER 20 MEQ PO TBCR
40.0000 meq | EXTENDED_RELEASE_TABLET | Freq: Every day | ORAL | Status: DC
  Administered 2015-06-05 – 2015-06-06 (×2): 40 meq via ORAL
  Filled 2015-06-05 (×2): qty 2

## 2015-06-05 MED ORDER — ISOSORBIDE MONONITRATE ER 30 MG PO TB24
30.0000 mg | ORAL_TABLET | Freq: Once | ORAL | Status: AC
  Administered 2015-06-05: 30 mg via ORAL
  Filled 2015-06-05: qty 1

## 2015-06-05 NOTE — Progress Notes (Signed)
UR Completed Aidric Endicott Graves-Bigelow, RN,BSN [phone]  

## 2015-06-05 NOTE — Progress Notes (Addendum)
Patient Name: [name] [name]. Date of Encounter: 06/05/2015  Principal Problem:   Unstable angina Active Problems:   Hyperlipidemia   Coronary atherosclerosis   Chronic atrial fibrillation   BIVentricular Defibrillator--St Jude   Cardiomyopathy, ischemic   Elevated troponin    Primary Cardiologist: Dr. Burt Knack Patient Profile: 81 yo male w/ PMH of CAD (BMS to LAD 04/2008,  DES to prox LAD 05/16/2015), chronic atrial fibrillation (s/p AVN ablation 08/2013) and CRT-D implantation, chronic systolic HF with mixed ICM/NICM, CKD stage IV-V (AV fistula placed on 03/2012), HTN, HLD, DM, secondary hyperparathyroidism, pulm HTN, OSA (on CPAP) admitted on 06/04/2015 for chest pain and shortness of breath.  SUBJECTIVE: Patient was confused about why his Lasix dose was 40mg  instead of 80mg , but it was explained to him that IV is stronger than PO form and he has been responding well to it. Reports having a dry cough. Breathing is a bit better today. Denies any chest pain or palpitations.  OBJECTIVE Filed Vitals:   06/04/15 1845 06/04/15 2015 06/05/15 0533 06/05/15 1040  BP: 154/73 144/76 159/61 160/70  Pulse: 76 88 76 80  Temp: 98 F (36.7 C) 97.5 F (36.4 C) 98.2 F (36.8 C)   TempSrc: Oral Oral Oral   Resp: 20 20 20    Height:   5\' 11"  (1.803 m)   Weight:   177 lb (80.287 kg)   SpO2: 95% 97% 96%     Intake/Output Summary (Last 24 hours) at 06/05/15 1106 Last data filed at 06/05/15 0900  Gross per 24 hour  Intake    240 ml  Output   2550 ml  Net  -2310 ml   Filed Weights   06/05/15 0533  Weight: 177 lb (80.287 kg)    PHYSICAL EXAM General: Well developed, well nourished, male in no acute distress. Head: Normocephalic, atraumatic.  Neck: Supple without bruits, JVD elevated to 8-9cm. Lungs:  Resp regular and unlabored, Rales present at bases bilaterally. Heart: Ventricular paced rhythm, no S3, S4, or murmur; no rub. Abdomen: Soft, non-tender, non-distended with normoactive  bowel sounds. No hepatomegaly. No rebound/guarding. No obvious abdominal masses. Extremities: No clubbing or cyanosis, trace edema. Distal pedal pulses are 2+ bilaterally. Neuro: Alert and oriented X 3. Moves all extremities spontaneously. Psych: Normal affect.   LABS: CBC: Recent Labs  06/04/15 1408 06/05/15 0406  WBC 6.3 6.0  HGB 11.2* 11.3*  HCT 33.8* 35.0*  MCV 94.2 94.3  PLT 112* 99*   INR: Recent Labs  06/05/15 0406  INR 8.11*   Basic Metabolic Panel: Recent Labs  06/04/15 1408 06/05/15 0406  NA 141 140  K 3.7 3.7  CL 108 108  CO2 23 19*  GLUCOSE 111* 145*  BUN 64* 68*  CREATININE 4.33* 4.56*  CALCIUM 9.3 9.1  MG 2.3  --    Liver Function Tests: Recent Labs  06/04/15 1408  AST 19  ALT 19  ALKPHOS 28*  BILITOT 0.8  PROT 5.9*  ALBUMIN 3.3*   Cardiac Enzymes: Recent Labs  06/04/15 1904 06/04/15 2157 06/05/15 0406  TROPONINI 0.07* 0.06* 0.07*   BNP:  B NATRIURETIC PEPTIDE  Date/Time Value Ref Range Status  06/04/2015 07:04 PM 1655.4* 0.0 - 100.0 pg/mL Final  06/04/2015 03:37 PM 1099.9* 0.0 - 100.0 pg/mL Final    Thyroid Function Tests: Recent Labs  06/04/15 1904  TSH 0.925    TELE:   Ventricular paced rhythm with rate in 60's - 70's.    CARDIAC CATHETERIZATION:  05/09/15  Mid RCA lesion, 50% stenosed.  Prox LAD lesion, 75% stenosed.  Ost 1st Diag to 1st Diag lesion, 80% stenosed.  Prox LAD to Mid LAD lesion, 25% stenosed. A bare metal stent was placed. The lesion was previously treated with a bare metal stent . 1. SEVERE PROXIMAL LAD STENOSIS 2. SEVERE DIAGONAL BRANCH STENOSIS (SMALL VESSEL) 3. NONOBSTRUCTIVE LCX AND RCA STENOSES 4. PRESERVED CARDIAC OUTPUT AND WELL-COMPENSATED HEMODYNAMICS  RECOMMEND: STAGED PCI OF THE LAD (STAGED PROCEDURE BECAUSE OF ADVANCED KIDNEY DISEASE). WHILE THE PROXIMAL LAD HAS SIGNIFICANT STENOSIS, THE LESION APPEARS NON-CRITICAL AND IT MAY BE BEST TO STAGE THE PCI PROCEDURE FOR APPROXIMATELY 1  WEEK TO MINIMIZE RENAL RISK. WOULD HOLD WARFARIN AND START PLAVIX. RESUME WARFARIN FOLLOWING PCI.   05/16/15- PCI  Ost LAD to Prox LAD lesion, 75% stenosed. There is a 0% residual stenosis post intervention.  A drug-eluting stent was placed.   Radiology/Studies: Dg Chest 2 View: 06/04/2015   CLINICAL DATA:  Left chest pain and shortness of breath today.  EXAM: CHEST  2 VIEW  COMPARISON:  May 07, 2015  FINDINGS: The heart size and mediastinal contours are stable. The heart size is enlarged. Cardiac pacemaker is unchanged. The lungs are hyperinflated. There is mild atelectasis of the right lung base. There is no focal pneumonia, pulmonary edema. There probable minimal bilateral pleural effusions. The visualized skeletal structures are stable.  IMPRESSION: No focal pneumonia. Probable minimal bilateral pleural effusions. Atelectasis of right lung base. Emphysema.   Electronically Signed   By: Abelardo Diesel M.D.   On: 06/04/2015 14:52     Current Medications:  . atorvastatin  40 mg Oral q1800  . [START ON 06/06/2015] calcitRIOL  0.25 mcg Oral Q M,W,F  . clopidogrel  75 mg Oral Daily  . digoxin  0.125 mg Oral QODAY  . furosemide  40 mg Intravenous Q12H  . hydrALAZINE  25 mg Oral 3 times per day  . insulin aspart protamine- aspart  16 Units Subcutaneous Q breakfast  . isosorbide mononitrate  15 mg Oral Daily  . metoprolol succinate  100 mg Oral BID  . potassium chloride SA  40 mEq Oral Daily      ASSESSMENT AND PLAN:  1. Chest Pressure/ Shortness of Breath - history of CAD with BMS to LAD in 04/2008 and most recently DES to prox LAD on 05/16/2015. - presented on 06/04/2015 for chest pain and shortness of breath that developed earlier that day. Had been active and having no pain or breathing difficulties prior to that episode. - BNP elevated to 1655. Cyclic troponins have been 0.07, 0.06, and 0.07 respectively. - Denies any chest pain at this time.  2. Acute on Chronic Systolic Congestive  Heart Failure - EF 25-30% on Echo in 11/2014. BNP elevated to 1655 on admission. - Net -2.3L since admission. Still has elevated JVD and rales on exam with only trace edema. Recommend continuing IV Lasix 40mg  BID for at least 1 more day. - reports his breathing has improved. Still having a dry cough.  3. Ischemic Cardiomyopathy -   BIVentricular Defibrillator--St Jude in 2010  4. Chronic atrial fibrillation - Rate controlled. - Coumadin held overnight in case of intervention. Can be restarted by pharmacy.  5. HLD - LDL elevated to 105 on 05/08/2015. - Was not on statin PTA, but Lipitor 40mg  started on 06/04/2015.  Arna Medici , PA-C 11:06 AM 06/05/2015 Pager: [phone]   The patient was seen, examined and discussed with Bernerd Pho,  PA-C and I agree with the above.    81 year old male with known CAD, a recent DES to LAD on 05/16/15, admitted yesterday for SOB, CP, found to be in acute on chronic systolic CHF, LVEF 77-37%, on no diuretics at home, 170 lbs on 8/30 and 177 today after 2-3 L diuresis since yesterday. I would continue lasix 40 mg iv BID for now, Crea 4.5 stable, he is hypertensive, no ACE/ARB as CKD stage 4, increase Imdur to 60 mg po daily.  Elevated troponin sec to CHF and CKD stage 4, no ischemic work up right now, he is chest pain free post diuresis.   Dorothy Spark 06/05/2015

## 2015-06-05 NOTE — Progress Notes (Signed)
Pt indicating he is not going to take his medication until he has spoken to the attending MD. Hulen Skains PA-C to follow-up with pt , instruction to follow-up with Pharmacy.Pt  States he wants an MD to talk to him.

## 2015-06-05 NOTE — Progress Notes (Signed)
   06/05/15 1200  Clinical Encounter Type  Visited With Patient  Visit Type Initial (AD consult)  Referral From Patient;Nurse  Consult/Referral To Chaplain  Recommendations AD paperwork not completed; reviewed with pt to return for follow up on 9/14  [place] (Adavanced Directive Consult)  Advance Directives ([place])  Does patient have an advance directive? Yes  Does patient want to make changes to advanced directive? Yes - Spiritual care consult ordered  Copy of advanced directive(s) in chart? (Pt to complete; return to notarize 9/14)  See Chart.

## 2015-06-05 NOTE — Consult Note (Signed)
[name] [name] for Coumadin Indication: atrial fibrillation  Allergies  Allergen Reactions  . Ace Inhibitors Other (See Comments)    Stopped by Dr. Burt Knack due to progressive renal failure  . Prednisone Other (See Comments)    Raised blood sugar to almost 900    Vital Signs: Temp: 98.2 F (36.8 C) (09/13 0533) Temp Source: Oral (09/13 0533) BP: 160/70 mmHg (09/13 1040) Pulse Rate: 80 (09/13 1040)  Labs:  Recent Labs  06/04/15 1408 06/04/15 1904 06/04/15 2157 06/05/15 0406  HGB 11.2*  --   --  11.3*  HCT 33.8*  --   --  35.0*  PLT 112*  --   --  99*  LABPROT 27.0*  --   --  25.2*  INR 2.53*  --   --  2.32*  CREATININE 4.33*  --   --  4.56*  TROPONINI 0.06* 0.07* 0.06* 0.07*    Estimated Creatinine Clearance: 13.3 mL/min (by C-G formula based on Cr of 4.56).   Medical History: Past Medical History  Diagnosis Date  . Chronic atrial fibrillation     a. Historically difficult rate control. b. s/p CRT-D implantation with anticipation of AV junction ablation but patient then was able to accomplish rate control and ablation not undertaken.; AVN ablation 08/2013 by Dr Lovena Le  . CARDIOMYOPATHY, PRIMARY, DILATED     a. Mixed ICM/NICM - EF 15% by echo 05/2009; St. Jude CRT-D implanted 06/2009. b. Echo 04/2013 - EF 20%, mild LVH.  [name] Chronic systolic CHF (congestive heart failure)   . CORONARY ARTERY DISEASE     a. BMS to LAD 04/2008. b. Cath 06/2009: nonobstructive disease. c. cath 05/16/2015 DES to prox LAD  . SLEEP APNEA   . Hypertension   . Hyperlipidemia   . Diabetes mellitus   . Osteoarthritis   . Tubular adenoma of colon   . Secondary hyperparathyroidism   . CKD (chronic kidney disease), stage IV     a. Right brachiocephalic AV fistula 12/5362. Neph = Dunham.  . Vertigo   . Valvular heart disease     a. Echo 04/2013: mild AI, mild MR.  . Pulmonary HTN     a. PA pressure 57mmHg by echo 05/01/13.  . Prostate cancer     a. s/p radical  prostatectomy.  . Colon cancer   . Skin cancer   . Presence of permanent cardiac pacemaker    Assessment: 84yom on coumadin pta for afib being admitted with chest pain. Troponins negative thus far. Coumadin to resume. INR therapeutic.  Home dose: 5mg  daily except 2.5mg  on Monday - last dose taken today  Goal of Therapy:  INR 2-3 Monitor platelets by anticoagulation protocol: Yes   Plan:  1) Coumadin 5 mg po x 1 2) Daily INR  Thank you Anette Guarneri, PharmD [phone]  06/05/2015,12:57 PM

## 2015-06-06 DIAGNOSIS — I5023 Acute on chronic systolic (congestive) heart failure: Secondary | ICD-10-CM

## 2015-06-06 DIAGNOSIS — I5043 Acute on chronic combined systolic (congestive) and diastolic (congestive) heart failure: Secondary | ICD-10-CM

## 2015-06-06 DIAGNOSIS — N184 Chronic kidney disease, stage 4 (severe): Secondary | ICD-10-CM

## 2015-06-06 DIAGNOSIS — I200 Unstable angina: Secondary | ICD-10-CM

## 2015-06-06 LAB — GLUCOSE, CAPILLARY
GLUCOSE-CAPILLARY: 179 mg/dL — AB (ref 65–99)
Glucose-Capillary: 165 mg/dL — ABNORMAL HIGH (ref 65–99)

## 2015-06-06 LAB — PROTIME-INR
INR: 1.86 — ABNORMAL HIGH (ref 0.00–1.49)
Prothrombin Time: 21.4 seconds — ABNORMAL HIGH (ref 11.6–15.2)

## 2015-06-06 MED ORDER — INSULIN LISPRO PROT & LISPRO (75-25 MIX) 100 UNIT/ML ~~LOC~~ SUSP: 26.0000 [IU] | Freq: Two times a day (BID) | SUBCUTANEOUS | Status: DC

## 2015-06-06 MED ORDER — ISOSORBIDE MONONITRATE ER 30 MG PO TB24
30.0000 mg | ORAL_TABLET | Freq: Once | ORAL | Status: AC
  Administered 2015-06-06: 15 mg via ORAL
  Filled 2015-06-06: qty 1

## 2015-06-06 MED ORDER — ISOSORBIDE MONONITRATE ER 60 MG PO TB24: 60.0000 mg | ORAL_TABLET | Freq: Every day | ORAL | Status: DC

## 2015-06-06 MED ORDER — WARFARIN SODIUM 7.5 MG PO TABS: 7.5000 mg | ORAL_TABLET | Freq: Once | ORAL | Status: DC

## 2015-06-06 MED ORDER — FUROSEMIDE 80 MG PO TABS: 80.0000 mg | ORAL_TABLET | Freq: Two times a day (BID) | ORAL | Status: DC

## 2015-06-06 MED ORDER — ISOSORBIDE MONONITRATE ER 30 MG PO TB24: 30.0000 mg | ORAL_TABLET | Freq: Every day | ORAL | Status: DC

## 2015-06-06 MED ORDER — WARFARIN - PHARMACIST DOSING INPATIENT: Freq: Every day | Status: DC

## 2015-06-06 MED ORDER — POTASSIUM CHLORIDE CRYS ER 20 MEQ PO TBCR: 40.0000 meq | EXTENDED_RELEASE_TABLET | Freq: Every day | ORAL | Status: DC

## 2015-06-06 NOTE — Discharge Summary (Signed)
CARDIOLOGY DISCHARGE SUMMARY   Patient ID: [name] [name]. MRN: [id number] DOB/AGE: 11/13/1930 81 y.o.  Admit date: 06/04/2015 Discharge date: 06/06/2015  PCP: Nyoka Cowden, MD Primary Cardiologist: Dr. Burt Knack  Primary Discharge Diagnosis: Acute on Chronic Systolic Heart Failure Secondary Discharge Diagnosis: Hyperlipidemia, Coronary atherosclerosis, Chronic atrial fibrillation, BIVentricular Defibrillator--St Jude, Ischemic Cardiomyopathy, Elevated troponin, Unstable Angina  Consults: None  Procedures: None  Hospital Course: [name] [name]. is a 81 y.o. male with past medical history of CAD (BMS to LAD 04/2008, DES to prox LAD 05/16/2015), chronic atrial fibrillation (s/p AVN ablation) and CRT-D implantation, chronic systolic HF with mixed ICM/NICM (EF 25-30% 11/2014), CKD stage IV-V (AV fistula placed on 03/2012), HTN, HLD, DM, secondary hyperparathyroidism, pulm HTN, OSA (on CPAP)who presented to [facility] ED on 06/04/2015 with shortness of breath and chest pain that started that morning.  His chest pain had resolved by the time he presented to the ED, but his dyspnea was still present. His weight was up to 177 lbs, with a dry weight of 169-170 lbs. Initial troponin was slightly elevated at 0.06. BNP was elevated to 1655. He was admitted for acute on chronic systolic heart failure and started on Lasix 40 mg IV BID for diuresis.   He reported on the morning of 06/05/2015 that his breathing had improved but he still had a dry cough. He still appeared volume overloaded on exam, even with net output of -2.3L,  so it was recommended to continue diuresis for another day. Cyclic troponins remained at 0.07, 0.06, and 0.07 and he was without a reoccurrence in chest pain at that time. Therefore, no further ischemic workup was pursued.  With his BP being elevated in the 150's and 160's, it was recommended his Imdur dose be increased from 15mg  to 60mg . On the morning of 06/06/2015  he voiced concern over the dose adjustment saying his BP had been in that range for most of his life. Following a discussion with Dr. Meda Coffee, his Imdur was increased to 30mg  daily and his BP will be closely monitored in the outpatient setting.  He also expressed frustration about being started on a statin medication at admission. He does not wish to be on this medication even after discussion of the benefits and risks of the medication. He wishes to discuss statins with his PCP and other providers and this can potentially be restarted in the outpatient setting if the patient so desires.  In relation to his shortness of breath, he reported his symptoms had continued to improve overnight and his dry cough had also resolved. His weight had returned to baseline of 170 lbs as well.   The patient was last examined by Dr. Meda Coffee and deemed stable for discharge. All of his home medications including Lasix 80mg  PO BID were resumed with the only change being Imdur from 15mg  daily to 30mg  daily. He has scheduled hospital cardiology follow-up on 06/11/2015 with Richardson Dopp, PA-C.   Labs:   Lab Results  Component Value Date   WBC 6.0 06/05/2015   HGB 11.3* 06/05/2015   HCT 35.0* 06/05/2015   MCV 94.3 06/05/2015   PLT 99* 06/05/2015    Recent Labs Lab 06/04/15 1408 06/05/15 0406  NA 141 140  K 3.7 3.7  CL 108 108  CO2 23 19*  BUN 64* 68*  CREATININE 4.33* 4.56*  CALCIUM 9.3 9.1  PROT 5.9*  --   BILITOT 0.8  --   ALKPHOS 28*  --  ALT 19  --   AST 19  --   GLUCOSE 111* 145*    Recent Labs  06/04/15 1904 06/04/15 2157 06/05/15 0406  TROPONINI 0.07* 0.06* 0.07*   Lipid Panel     Component Value Date/Time   CHOL 172 05/08/2015 0535   TRIG 211* 05/08/2015 0535   TRIG 136 08/17/2006 0807   HDL 25* 05/08/2015 0535   CHOLHDL 6.9 05/08/2015 0535   CHOLHDL 3.6 CALC 08/17/2006 0807   VLDL 42* 05/08/2015 0535   LDLCALC 105* 05/08/2015 0535   LDLDIRECT 102.7 08/03/2012 0929   LDLDIRECT  123.1 07/06/2006 0837    B NATRIURETIC PEPTIDE  Date/Time Value Ref Range Status  06/04/2015 07:04 PM 1655.4* 0.0 - 100.0 pg/mL Final  06/04/2015 03:37 PM 1099.9* 0.0 - 100.0 pg/mL Final   Recent Labs  06/06/15 0420  INR 1.86*      Radiology: Dg Chest 2 View: 06/04/2015   CLINICAL DATA:  Left chest pain and shortness of breath today.  EXAM: CHEST  2 VIEW  COMPARISON:  May 07, 2015  FINDINGS: The heart size and mediastinal contours are stable. The heart size is enlarged. Cardiac pacemaker is unchanged. The lungs are hyperinflated. There is mild atelectasis of the right lung base. There is no focal pneumonia, pulmonary edema. There probable minimal bilateral pleural effusions. The visualized skeletal structures are stable.  IMPRESSION: No focal pneumonia. Probable minimal bilateral pleural effusions. Atelectasis of right lung base. Emphysema.   Electronically Signed   By: Abelardo Diesel M.D.   On: 06/04/2015 14:52     EKG: Ventricular Paced Rhythm with PVC's.    FOLLOW UP PLANS AND APPOINTMENTS Allergies  Allergen Reactions  . Ace Inhibitors Other (See Comments)    Stopped by Dr. Burt Knack due to progressive renal failure  . Prednisone Other (See Comments)    Raised blood sugar to almost 900     Medication List    ASK your doctor about these medications        acetaminophen 500 MG tablet  Commonly known as:  TYLENOL  Take 500 mg by mouth as needed for mild pain.     calcitRIOL 0.25 MCG capsule  Commonly known as:  ROCALTROL  Take 0.25 mcg by mouth. Monday, Wed and Friday     clopidogrel 75 MG tablet  Commonly known as:  PLAVIX  Take 1 tablet (75 mg total) by mouth daily.     DIGOX 0.125 MG tablet  Generic drug:  digoxin  Take 1 tablet by mouth  every other day     furosemide 80 MG tablet  Commonly known as:  LASIX  Take 1 tablet by mouth two  times daily     hydrALAZINE 25 MG tablet  Commonly known as:  APRESOLINE  Take 1 and 1/2 tablets by  mouth 3 times daily       insulin lispro protamine-lispro (75-25) 100 UNIT/ML Susp injection  Commonly known as:  HUMALOG 75/25 MIX  Inject 22 Units into the skin 2 (two) times daily with a meal.     isosorbide mononitrate 30 MG 24 hr tablet  Commonly known as:  IMDUR  Take 0.5 tablets (15 mg total) by mouth daily.     metoprolol succinate 100 MG 24 hr tablet  Commonly known as:  TOPROL-XL  Take 1 tablet (100 mg total) by mouth 2 (two) times daily. Take with or immediately following a meal.     potassium chloride SA 20 MEQ tablet  Commonly known as:  K-DUR,KLOR-CON  Take 1 tablet (20 mEq total) by mouth daily.     warfarin 5 MG tablet  Commonly known as:  COUMADIN  Take 2.5mg  (half tablet) Monday and Friday, 5mg  on all other days             Follow-up Information    Follow up with Richardson Dopp, PA-C On 06/11/2015.   Specialties:  Physician Assistant, Radiology, Interventional Cardiology   Why:  Cardiology follow-up appointment on 06/11/2015 at 2:00 PM.   Contact information:   [address] [address] [phone]       BRING ALL MEDICATIONS WITH YOU TO FOLLOW UP APPOINTMENTS  Time spent with patient to include physician time: 40 minutes Signed: Erma Heritage, PA 06/06/2015, 12:30 PM Co-Sign MD

## 2015-06-06 NOTE — Discharge Instructions (Signed)
PLEASE RESUME TAKING YOUR POTASSIUM SUPPLEMENTATION, LASIX,  AND INSULIN AS YOU WERE PRIOR TO ADMISSION. NO CHANGES WERE MADE TO THOSE MEDICATIONS.  THE ONLY MEDICATION CHANGE THIS ADMISSION WAS IMDUR FROM 30MG  DAILY TO 60MG  DAILY.

## 2015-06-06 NOTE — Progress Notes (Signed)
   06/06/15 1126  Clinical Encounter Type  Visited With Patient  Visit Type Follow-up  Referral From Chaplain;Nurse  Advance Directives (For Healthcare)  Does patient have an advance directive? Yes  Type of Advance Directive Living will  Copy of advanced directive(s) in chart? Yes  Chaplain did a follow-up visit with Pt; Chaplain helped to get Advance Directive notarized;

## 2015-06-06 NOTE — Progress Notes (Addendum)
Patient Name: [name] [name]. Date of Encounter: 06/06/2015  Principal Problem:   Unstable angina Active Problems:   Hyperlipidemia   Coronary atherosclerosis   Chronic atrial fibrillation   BIVentricular Defibrillator--St Jude   Cardiomyopathy, ischemic   Elevated troponin    Primary Cardiologist: Dr. Burt Knack Patient Profile: 81 yo male w/ PMH of CAD (BMS to LAD 04/2008, DES to prox LAD 05/16/2015), chronic atrial fibrillation (s/p AVN ablation 08/2013) and CRT-D implantation, chronic systolic HF with mixed ICM/NICM, CKD stage IV-V (AV fistula placed on 03/2012), HTN, HLD, DM, secondary hyperparathyroidism, pulm HTN, OSA (on CPAP) admitted on 06/04/2015 for chest pain and shortness of breath.  SUBJECTIVE: Denies any chest pain or shortness of breath. Reports improvement in his dry cough. Refusing to take Lipitor or Imdur 60mg  since "his cholesterol and BP have been controlled for years on the medications he was taken prior to coming to the hospital".  OBJECTIVE Filed Vitals:   06/05/15 1811 06/05/15 2033 06/06/15 0631 06/06/15 0656  BP: 145/68 132/62 160/73   Pulse: 70 77 62   Temp:  98.2 F (36.8 C) 97.8 F (36.6 C)   TempSrc:  Oral Oral   Resp:  18 18   Height:      Weight:    170 lb 1.6 oz (77.157 kg)  SpO2:  96% 96%     Intake/Output Summary (Last 24 hours) at 06/06/15 0814 Last data filed at 06/06/15 0400  Gross per 24 hour  Intake    720 ml  Output   1150 ml  Net   -430 ml   Filed Weights   06/05/15 0533 06/06/15 0656  Weight: 177 lb (80.287 kg) 170 lb 1.6 oz (77.157 kg)    PHYSICAL EXAM General: Well developed, well nourished, male in no acute distress. Head: Normocephalic, atraumatic.  Neck: Supple without bruits, JVD still elevated 8cm. Lungs:  Resp regular and unlabored, CTA without wheezing or rales. Heart: RRR, S1, S2, no S3, S4, or murmur; no rub. Abdomen: Soft, non-tender, non-distended with normoactive bowel sounds. No hepatomegaly. No  rebound/guarding. No obvious abdominal masses. Extremities: No clubbing, cyanosis, or edema. Distal pedal pulses are 2+ bilaterally. Neuro: Alert and oriented X 3. Moves all extremities spontaneously. Psych: Normal affect.   LABS: CBC: Recent Labs  06/04/15 1408 06/05/15 0406  WBC 6.3 6.0  HGB 11.2* 11.3*  HCT 33.8* 35.0*  MCV 94.2 94.3  PLT 112* 99*   INR: Recent Labs  06/06/15 0420  INR 9.38*   Basic Metabolic Panel: Recent Labs  06/04/15 1408 06/05/15 0406  NA 141 140  K 3.7 3.7  CL 108 108  CO2 23 19*  GLUCOSE 111* 145*  BUN 64* 68*  CREATININE 4.33* 4.56*  CALCIUM 9.3 9.1  MG 2.3  --    Liver Function Tests: Recent Labs  06/04/15 1408  AST 19  ALT 19  ALKPHOS 28*  BILITOT 0.8  PROT 5.9*  ALBUMIN 3.3*   Cardiac Enzymes: Recent Labs  06/04/15 1904 06/04/15 2157 06/05/15 0406  TROPONINI 0.07* 0.06* 0.07*   BNP:  B NATRIURETIC PEPTIDE  Date/Time Value Ref Range Status  06/04/2015 07:04 PM 1655.4* 0.0 - 100.0 pg/mL Final  06/04/2015 03:37 PM 1099.9* 0.0 - 100.0 pg/mL Final   Thyroid Function Tests: Recent Labs  06/04/15 1904  TSH 0.925   TELE:   V-paced 60's- 70's. Frequent PVC's.      Radiology/Studies: Dg Chest 2 View: 06/04/2015   CLINICAL DATA:  Left chest  pain and shortness of breath today.  EXAM: CHEST  2 VIEW  COMPARISON:  May 07, 2015  FINDINGS: The heart size and mediastinal contours are stable. The heart size is enlarged. Cardiac pacemaker is unchanged. The lungs are hyperinflated. There is mild atelectasis of the right lung base. There is no focal pneumonia, pulmonary edema. There probable minimal bilateral pleural effusions. The visualized skeletal structures are stable.  IMPRESSION: No focal pneumonia. Probable minimal bilateral pleural effusions. Atelectasis of right lung base. Emphysema.   Electronically Signed   By: Abelardo Diesel M.D.   On: 06/04/2015 14:52     Current Medications:  . atorvastatin  40 mg Oral q1800  .  calcitRIOL  0.25 mcg Oral Q M,W,F  . clopidogrel  75 mg Oral Daily  . digoxin  0.125 mg Oral QODAY  . furosemide  40 mg Intravenous Q12H  . hydrALAZINE  37.5 mg Oral 3 times per day  . insulin aspart protamine- aspart  16 Units Subcutaneous Q breakfast  . isosorbide mononitrate  60 mg Oral Daily  . metoprolol succinate  100 mg Oral BID WC  . potassium chloride SA  40 mEq Oral Daily      ASSESSMENT AND PLAN:  1. Chest Pressure/ Shortness of Breath - history of CAD with BMS to LAD in 04/2008 and most recently DES to prox LAD on 05/16/2015. - presented on 06/04/2015 for chest pain and shortness of breath that developed earlier that day. Had been active and having no pain or breathing difficulties prior to that episode. - BNP elevated to 1655. Cyclic troponins have been 0.07, 0.06, and 0.07 respectively. - Denies any chest pain at this time.  2. Acute on Chronic Systolic Congestive Heart Failure - EF 25-30% on Echo in 11/2014. BNP elevated to 1655 on admission. - Net -3.0L since admission. Weight down to 170lbs on 06/06/2015, from 177lbs on admission. - JVD still appears elevated but no other signs of volume overload on exam. Was weighing 169 -170lbs at home prior to admission so it appears he is close to his dry weight.  3. Ischemic Cardiomyopathy - BIVentricular Defibrillator--St Jude in 2010  4. Chronic atrial fibrillation - Rate controlled. - Coumadin per pharmacy dosing.  5. HLD - LDL elevated to 105 on 05/08/2015. - Was not on statin PTA, but Lipitor 40mg  started on 06/04/2015. Patient refuses to take a statin at this time, due to reading it "does not have medical benefits". He was educated on the importance of cholesterol medication in relation to cardiovascular disease and his previous lipid panel from August 2016 was reviewed. The patient does not wish to take the medication at this time, for he wants to discuss it with his PCP and other providers. Discontinued the medication at  this time. Will need to be readdressed as an outpatient.  6. Hypertension - Remains hypertensive at 132/60 - 160/73 in the past 24 hours. - Continue to avoid ACE/ARB due to Stage IV CKD. - Imdur increased from 30mg  to 60mg  daily on 06/05/2015 for better BP control. The patient reports his BP has been these numbers for years and no one has said this was too high. He was educated on the benefits of BP control in relation to stroke risk, cardiovascular disease, and renal disease. The patient also mentioned he has never been aware of being hypotensive. He is in agreement to continue Imdur 30mg  while in the hospital, therefore will change the order back from 60mg  to 30mg  at this time so he  does receive his morning dose.    Signed, Erma Heritage , PA-C 8:14 AM 06/06/2015 Pager: [phone]   The patient was seen, examined and discussed with Bernerd Pho, PA-C and I agree with the above.    81 year old male with known CAD, a recent DES to LAD on 05/16/15, admitted yesterday for SOB, CP, found to be in acute on chronic systolic CHF, LVEF 41-93%, on no diuretics at home, 170 lbs on 8/30 and 177 yesterday, today back to baseline. We will switch to oral lasix 80 mg po BID, discharge home and arrange a follow up in our clinic in 1-2 weeks.  Crea 4.5 stable, he is hypertensive, no ACE/ARB as CKD stage 4, increased Imdur to 60 mg po daily.  Elevated troponin sec to CHF and CKD stage 4, no ischemic work up right now, he is chest pain free post diuresis.  Dorothy Spark 06/06/2015

## 2015-06-06 NOTE — Consult Note (Signed)
[place] for Coumadin Indication: atrial fibrillation  Allergies  Allergen Reactions  . Ace Inhibitors Other (See Comments)    Stopped by Dr. Burt Knack due to progressive renal failure  . Prednisone Other (See Comments)    Raised blood sugar to almost 900    Vital Signs: Temp: 97.8 F (36.6 C) (09/14 0631) Temp Source: Oral (09/14 0631) BP: 160/73 mmHg (09/14 0631) Pulse Rate: 62 (09/14 0631)  Labs:  Recent Labs  06/04/15 1408 06/04/15 1904 06/04/15 2157 06/05/15 0406 06/06/15 0420  HGB 11.2*  --   --  11.3*  --   HCT 33.8*  --   --  35.0*  --   PLT 112*  --   --  99*  --   LABPROT 27.0*  --   --  25.2* 21.4*  INR 2.53*  --   --  2.32* 1.86*  CREATININE 4.33*  --   --  4.56*  --   TROPONINI 0.06* 0.07* 0.06* 0.07*  --     Estimated Creatinine Clearance: 13.3 mL/min (by C-G formula based on Cr of 4.56).   Medical History: Past Medical History  Diagnosis Date  . Chronic atrial fibrillation     a. Historically difficult rate control. b. s/p CRT-D implantation with anticipation of AV junction ablation but patient then was able to accomplish rate control and ablation not undertaken.; AVN ablation 08/2013 by Dr Lovena Le  . CARDIOMYOPATHY, PRIMARY, DILATED     a. Mixed ICM/NICM - EF 15% by echo 05/2009; St. Jude CRT-D implanted 06/2009. b. Echo 04/2013 - EF 20%, mild LVH.  [name] Chronic systolic CHF (congestive heart failure)   . CORONARY ARTERY DISEASE     a. BMS to LAD 04/2008. b. Cath 06/2009: nonobstructive disease. c. cath 05/16/2015 DES to prox LAD  . SLEEP APNEA   . Hypertension   . Hyperlipidemia   . Diabetes mellitus   . Osteoarthritis   . Tubular adenoma of colon   . Secondary hyperparathyroidism   . CKD (chronic kidney disease), stage IV     a. Right brachiocephalic AV fistula 03/9389. Neph = [place].  . Vertigo   . Valvular heart disease     a. Echo 04/2013: mild AI, mild MR.  . Pulmonary HTN     a. PA pressure 13mmHg by echo  05/01/13.  . Prostate cancer     a. s/p radical prostatectomy.  . Colon cancer   . Skin cancer   . Presence of permanent cardiac pacemaker    Assessment: 84yom on coumadin pta for afib being admitted with chest pain. Troponins negative thus far. Coumadin to resume. INR 1.86  Home dose: 5mg  daily except 2.5mg  on Monday - last dose taken today  Goal of Therapy:  INR 2-3 Monitor platelets by anticoagulation protocol: Yes   Plan:  1) Coumadin 7.5 mg po x 1 2) Daily INR  Thank you Anette Guarneri, PharmD [phone]  06/06/2015,10:18 AM

## 2015-06-07 ENCOUNTER — Encounter (HOSPITAL_COMMUNITY)
Admission: RE | Admit: 2015-06-07 | Discharge: 2015-06-07 | Disposition: A | Discharge status: Home / Self Care | Payer: Medicare Other | Source: Ambulatory Visit | Attending: Cardiovascular Disease | Admitting: Cardiovascular Disease

## 2015-06-07 DIAGNOSIS — Z79899 Other long term (current) drug therapy: Secondary | ICD-10-CM | POA: Insufficient documentation

## 2015-06-07 DIAGNOSIS — Z955 Presence of coronary angioplasty implant and graft: Secondary | ICD-10-CM | POA: Insufficient documentation

## 2015-06-07 DIAGNOSIS — Z48812 Encounter for surgical aftercare following surgery on the circulatory system: Secondary | ICD-10-CM | POA: Insufficient documentation

## 2015-06-07 NOTE — Progress Notes (Signed)
Cardiac Rehab Medication Review by a Pharmacist  Does the patient  feel that his/her medications are working for him/her?  yes  Has the patient been experiencing any side effects to the medications prescribed?  no  Does the patient measure his/her own blood pressure or blood glucose at home?  no   Does the patient have any problems obtaining medications due to transportation or finances?   no  Understanding of regimen: excellent Understanding of indications: excellent Potential of compliance: excellent    Pharmacist comments: Pt reports no probems with medication regimen and understands medication regimen.  Reports great adherence to medication regimen.    Bennye Alm, PharmD Pharmacy Resident [phone]

## 2015-06-08 ENCOUNTER — Encounter: Payer: Self-pay | Admitting: Podiatry

## 2015-06-08 ENCOUNTER — Telehealth (HOSPITAL_COMMUNITY): Payer: Self-pay | Admitting: *Deleted

## 2015-06-08 ENCOUNTER — Ambulatory Visit (INDEPENDENT_AMBULATORY_CARE_PROVIDER_SITE_OTHER): Payer: Medicare Other | Admitting: Podiatry

## 2015-06-08 DIAGNOSIS — M79606 Pain in leg, unspecified: Secondary | ICD-10-CM

## 2015-06-08 DIAGNOSIS — M216X9 Other acquired deformities of unspecified foot: Secondary | ICD-10-CM | POA: Diagnosis not present

## 2015-06-08 DIAGNOSIS — E1140 Type 2 diabetes mellitus with diabetic neuropathy, unspecified: Secondary | ICD-10-CM | POA: Diagnosis not present

## 2015-06-08 DIAGNOSIS — B351 Tinea unguium: Secondary | ICD-10-CM | POA: Diagnosis not present

## 2015-06-08 NOTE — Telephone Encounter (Signed)
-----   Message from Erma Heritage, [place] sent at 06/07/2015  8:19 PM EDT ----- Regarding: RE: Ok to proceed with cardiac rehab prior to post hospitalization appt 9/20 His scheduled follow-up appointment is on Monday so that should not be an issue (Please remind him that it is on Monday since he might think it is Tuesday). By exercise, I assume he means his cardiac rehab and that appears to be scheduled in the morning with his appointment being that afternoon.   He should be fine to resume exercise as long as his breathing is stable. Please inform him to gradually ease back into his exercise routine since he was just discharged from the hospital yesterday.  Thanks,  [place]  ----- Message -----    From: Rowe Pavy, RN    Sent: 06/07/2015  11:31 AM      To: Erma Heritage, PA Subject: FW: Ok to proceed with cardiac rehab prior t#  [place], I understand that Dr. Burt Knack may be out of town.  Pt is insisting he be able to start exercise on Monday and not wait for his follow up appt on Tuesday with Richardson Dopp.  Please advise  [place] ----- Message -----    From: Rowe Pavy, RN    Sent: 06/07/2015  11:27 AM      To: Sherren Mocha, MD Subject: Madaline Brilliant to proceed with cardiac rehab prior to po#   Dr. Burt Knack,   [place] showed today for orientation. Noted recent hospitalization -shortness of breath with d/c on 9/13. Hospital follow up with [facility] on 9/20. Pt insist that he would like to go ahead and start exercise on Monday 9/19 prior to seeing [place]. Is this appropriate for this patient?   Please advise   Thanks  Maurice Small RN

## 2015-06-08 NOTE — Patient Instructions (Signed)
Seen for hypertrophic nails. All nails debrided. Return in 3 months or as needed.  

## 2015-06-08 NOTE — Progress Notes (Signed)
Subjective:  81 y.o. year old male IDDM presents requesting toe nails trimmed today.  Still not able to gain weight. He is 5'11" and weight 170 lbs.  Objective:  Dermatologic:  Thick dystrophic nails x 10. Skin: No skin lesions.  Vascular:  Dorsalis pedis pulses palpable on both feet.  Posterior tibial pulses are not palpable on both feet.  No edema or erythema, no ischemic changes noted.  Orthopedic:  Cavus type foot without gross deformity.  Neurologic:  Failed respond to Monofilament sensory testing bilateral. Vibratory and Ankle DTR is within normal bilateral.   Assessment:  Dystrophic mycotic nails x 9.  Diabetic neuropathy.   Treatment: All mycotic nails and ingrown nails debrided.  Return in 3 months or as needed

## 2015-06-08 NOTE — Telephone Encounter (Signed)
Received medical clearance for pt to proceed with exercise on Monday at 8:15 prior to his appointment in follow up from brief hospitalization. Message left on answering machine.  Contact information provided if needed. Cherre Huger, BSN

## 2015-06-08 NOTE — Telephone Encounter (Signed)
-----   Message from Sherren Mocha, MD sent at 06/08/2015  9:04 AM EDT ----- Regarding: RE: Ok to proceed with cardiac rehab prior to post hospitalization appt 9/20 Yes this is ok ----- Message -----    From: Rowe Pavy, RN    Sent: 06/07/2015  11:27 AM      To: Sherren Mocha, MD Subject: Madaline Brilliant to proceed with cardiac rehab prior to po#   Dr. Burt Knack,   [place] showed today for orientation. Noted recent hospitalization -shortness of breath with d/c on 9/13. Hospital follow up with [facility] on 9/20. Pt insist that he would like to go ahead and start exercise on Monday 9/19 prior to seeing [place]. Is this appropriate for this patient?   Please advise   Thanks  Maurice Small RN

## 2015-06-10 NOTE — Progress Notes (Signed)
Cardiology Office Note   Date:  06/11/2015   ID:  [name] [name]., DOB 1931-05-19, MRN [id number]  PCP:  Nyoka Cowden, MD  Cardiologist:  Dr. Sherren Mocha   Electrophysiologist:  Dr. Virl Axe   Chief Complaint  Patient presents with  . Hospitalization Follow-up  . Congestive Heart Failure  . Coronary Artery Disease     History of Present Illness: [name] [name]. is a 81 y.o. male with a hx of CAD status post stenting to the LAD in 2009, dilated cardiomyopathy, chronic systolic HF, atrial fibrillation, stage IV CKD, status post AV node ablation 12/14, status post CRT-D, sleep apnea on CPAP, pulmonary hypertension. Cardiomyopathy is felt to be out of proportion to CAD (NICM).  Last seen by Dr. Burt Knack 6/16.  Admitted 8/15-8/18 dyspnea and malaise felt to be his anginal equivalent. He was followed by nephrology. Cardiac catheterization demonstrated severe proximal LAD stenosis, severe diagonal branch stenosis in a small vessel and nonobstructive LCx and RCA stenoses. Given his chronic kidney disease it was decided to bring the patient back for staged intervention to minimize the risk of contrast-induced nephropathy. He returned to [facility] 8/24-8/25 and underwent successful PCI of the proximal LAD with a DES and scoring balloon angioplasty. Recommendation was to continue Plavix for at least 6 months. His warfarin was resumed prior to discharge.  Readmitted 9/12-9/14 with acute on chronic systolic CHF. He was diuresed with IV Lasix. He had minimally elevated troponins. This is felt to be related to demand ischemia in the setting of chronic kidney disease. Long-acting nitrates were adjusted. Patient decided to stop taking statin medication until seen back in follow-up.  Here for follow-up. He is here today by himself. Since DC from the hospital, he has been doing well. Breathing is stable. He denies chest discomfort. He denies orthopnea, PND. LE edema is  stable. Weights at home have been stable. He denies syncope, cough, wheezing.   Studies/Reports Reviewed Today:  LHC 05/16/15  Ost LAD to Prox LAD lesion, 75% stenosed. There is a 0% residual stenosis post intervention.  A drug-eluting stent was placed. STENT RESOLUTE INTEG 3.5X15  Successful PCI of the proximal LAD with drug-eluting stent implantation and scoring balloon angioplasty Recommend Plavix 75 mg for at least 6 months. Warfarin will be restarted tonight.   LHC 05/09/15 LAD: Proximal 75%, mid stent 25% ISR, small ostial D1 80% LCx: Irregularities RCA: Mid 50% 1. SEVERE PROXIMAL LAD STENOSIS 2. SEVERE DIAGONAL BRANCH STENOSIS (SMALL VESSEL) 3. NONOBSTRUCTIVE LCX AND RCA STENOSES 4. PRESERVED CARDIAC OUTPUT AND WELL-COMPENSATED HEMODYNAMICS RECOMMEND: STAGED PCI OF THE LAD (STAGED PROCEDURE BECAUSE OF ADVANCED KIDNEY DISEASE). WHILE THE PROXIMAL LAD HAS SIGNIFICANT STENOSIS, THE LESION APPEARS NON-CRITICAL AND IT MAY BE BEST TO STAGE THE PCI PROCEDURE FOR APPROXIMATELY 1 WEEK TO MINIMIZE RENAL RISK. WOULD HOLD WARFARIN AND START PLAVIX. RESUME WARFARIN FOLLOWING PCI.   Echo 3/16 Mild LVH, EF 25-30%, diffuse HK, aortic sclerosis without stenosis, mild AI, mild MR, mild LAE, mild RAE, PASP 48 mmHg  Carotid US 8/14 Bilateral ICA 1-39%   Past Medical History  Diagnosis Date  . Chronic atrial fibrillation     a. Historically difficult rate control. b. s/p CRT-D implantation with anticipation of AV junction ablation but patient then was able to accomplish rate control and ablation not undertaken.; AVN ablation 08/2013 by Dr Lovena Le  . CARDIOMYOPATHY, PRIMARY, DILATED     a. Mixed ICM/NICM - EF 15% by echo 05/2009; St. Jude CRT-D implanted  06/2009. b. Echo 04/2013 - EF 20%, mild LVH.  [name] Chronic systolic CHF (congestive heart failure)   . CORONARY ARTERY DISEASE     a. BMS to LAD 04/2008. b. Cath 06/2009: nonobstructive disease. c. cath 05/16/2015 DES to prox LAD  . SLEEP APNEA     . Hypertension   . Hyperlipidemia   . Diabetes mellitus   . Osteoarthritis   . Tubular adenoma of colon   . Secondary hyperparathyroidism   . CKD (chronic kidney disease), stage IV     a. Right brachiocephalic AV fistula 04/3093. Neph = [place].  . Vertigo   . Valvular heart disease     a. Echo 04/2013: mild AI, mild MR.  . Pulmonary HTN     a. PA pressure 44mmHg by echo 05/01/13.  . Prostate cancer     a. s/p radical prostatectomy.  . Colon cancer   . Skin cancer   . Presence of permanent cardiac pacemaker     Past Surgical History  Procedure Laterality Date  . Colectomy    . Prostatectomy    . Penile prosthesis placement    . Removed prosthetic eye    . Ptca      stent placed  . Total knee arthroplasty  2008    right  . Pacemaker insertion  2010  . Av fistula placement  03/30/2012    Procedure: ARTERIOVENOUS (AV) FISTULA CREATION;  Surgeon: Angelia Mould, MD;  Location: [place];  Service: Vascular;  Laterality: Right;  Creation of BrachioCephalic Fistula Right arm  . Eye surgery      left eye prothesis  . Fracture surgery      collar bone, right knee replacement  . Bi-ventricular implantable cardioverter defibrillator  (crt-d)  2010    STJ CRTD implanted by Dr [name]  . Ablation  08/2013    AVN ablation by Dr Lovena Le  . Av node ablation N/A 09/12/2013    Procedure: AV NODE ABLATION;  Surgeon: [name], MD;  Location: [facility] CATH LAB;  Service: Cardiovascular;  Laterality: N/A;  . Insert / replace / remove pacemaker    . Joint replacement    . Cardiac catheterization N/A 05/09/2015    Procedure: Left Heart Cath and Coronary Angiography;  Surgeon: Sherren Mocha, MD;  Location: [place] CV LAB;  Service: Cardiovascular;  Laterality: N/A;  . Cardiac catheterization N/A 05/16/2015    Procedure: Coronary Stent Intervention;  Surgeon: Sherren Mocha, MD;  Location: [place] CV LAB;  Service: Cardiovascular;  Laterality: N/A;     Current Outpatient Prescriptions   Medication Sig Dispense Refill  . acetaminophen (TYLENOL) 500 MG tablet Take 500 mg by mouth as needed for mild pain.     . calcitRIOL (ROCALTROL) 0.25 MCG capsule Take 0.25 mcg by mouth. Monday, Wed and Friday    . calcium carbonate (TUMS EX) 750 MG chewable tablet Chew 1 tablet by mouth 3 (three) times daily with meals.    . clopidogrel (PLAVIX) 75 MG tablet Take 1 tablet (75 mg total) by mouth daily. 90 tablet 2  . DIGOX 125 MCG tablet Take 1 tablet by mouth  every other day 45 tablet 1  . furosemide (LASIX) 80 MG tablet Take 1 tablet by mouth two  times daily 180 tablet 1  . hydrALAZINE (APRESOLINE) 25 MG tablet Take 1 and 1/2 tablets by  mouth 3 times daily 315 tablet 0  . insulin lispro protamine-lispro (HUMALOG 75/25 MIX) (75-25) 100 UNIT/ML SUSP injection Inject 26 Units  into the skin 2 (two) times daily with a meal. 6 vial 1  . isosorbide mononitrate (IMDUR) 30 MG 24 hr tablet Take 1 tablet (30 mg total) by mouth daily. 30 tablet 6  . metoprolol succinate (TOPROL-XL) 100 MG 24 hr tablet Take 1 tablet (100 mg total) by mouth 2 (two) times daily. Take with or immediately following a meal. 60 tablet 0  . potassium chloride SA (K-DUR,KLOR-CON) 20 MEQ tablet Take 2 tablets (40 mEq total) by mouth daily. 60 tablet 3  . warfarin (COUMADIN) 5 MG tablet Take by mouth daily. Take 2.5 mg (one-half tablet) by mouth Mondays, and one whole tablet by mouth al other days (tue, Wed, Thur, Fri, Sat, Sun)     No current facility-administered medications for this visit.    Allergies:   Ace inhibitors and Prednisone    Social History:  The patient  reports that he quit smoking about 48 years ago. His smoking use included Cigarettes. He has never used smokeless tobacco. He reports that he drinks alcohol. He reports that he does not use illicit drugs.   Family History:  The patient's family history includes Heart disease in his father and mother; Hypertension in his mother; Throat cancer in his father and  mother.    ROS:   Please see the history of present illness.   Review of Systems  All other systems reviewed and are negative.     PHYSICAL EXAM: VS:  BP 122/62 mmHg  Pulse 75  Ht 5' 10.25" (1.784 m)  Wt 175 lb (79.379 kg)  BMI 24.94 kg/m2  SpO2 98%    Wt Readings from Last 3 Encounters:  06/11/15 175 lb (79.379 kg)  06/07/15 173 lb 15.1 oz (78.9 kg)  06/06/15 170 lb 1.6 oz (77.157 kg)     GEN: Well nourished, well developed, in no acute distress HEENT: normal Neck: no JVD,  no masses Cardiac:  Normal S1/S2, RRR; no murmur ,  no rubs or gallops, no edema; R groin without hematoma or bruit  Respiratory:  clear to auscultation bilaterally, no wheezing, rhonchi or rales. GI: soft, nontender, nondistended, + BS MS: no deformity or atrophy Skin: warm and dry  Neuro:  CNs II-XII intact, Strength and sensation are intact Psych: Normal affect   EKG:  EKG is ordered today.  ECG from 06/06/15 demonstrates:   V paced   Recent Labs: 06/04/2015: ALT 19; B Natriuretic Peptide 1655.4*; Magnesium 2.3; TSH 0.925 06/05/2015: Hemoglobin 11.3*; Platelets 99* 06/11/2015: BUN 66*; Creatinine, Ser 4.18*; Potassium 3.8; Sodium 142    Lipid Panel    Component Value Date/Time   CHOL 172 05/08/2015 0535   TRIG 211* 05/08/2015 0535   TRIG 136 08/17/2006 0807   HDL 25* 05/08/2015 0535   CHOLHDL 6.9 05/08/2015 0535   CHOLHDL 3.6 CALC 08/17/2006 0807   VLDL 42* 05/08/2015 0535   LDLCALC 105* 05/08/2015 0535   LDLDIRECT 102.7 08/03/2012 0929   LDLDIRECT 123.1 07/06/2006 0837      ASSESSMENT AND PLAN:  1. CAD:  Status post DES to the proximal LAD. He had ostial D1 80% stenosis and a very small vessel which was treated medically. Plavix is to be continued for at least 6 months along with warfarin. Continue nitrates, beta blocker. He would like to discuss whether or not to start statin therapy with Dr. Lorrene Reid.  2. Dilated Cardiomyopathy:  This is a nonischemic cardiomyopathy. EF 25-30% by  echocardiogram in 3/16. He is not on ACE inhibitor secondary to advanced  chronic kidney disease. Continue beta blocker, hydralazine, nitrates.  3. Chronic Systolic CHF: He had a recent admission to the hospital with acute on chronic systolic CHF. He was successfully diuresed. Currently NYHA 2-2b. Check BMET today to follow-up on renal function and potassium.  4. HTN: Controlled.    5. CKD stage V:  FU with Dr. Lorrene Reid. Creatinine 06/01/15 4.31.    6. Status post CRT-D:  Follow up with EP as planned.    7. Hyperlipidemia: The patient would like to discuss whether or not to start statin therapy with his nephrologist Dr. Lorrene Reid. We discussed the benefits of statin therapy in patients with obstructive coronary disease and prior PCI.   8. Paroxysmal Atrial Fibrillation: Warfarin is managed by primary care.    Medication Changes: Current medicines are reviewed at length with the patient today.  Concerns regarding medicines are as outlined above.  The following changes have been made:   Discontinued Medications   WARFARIN (COUMADIN) 5 MG TABLET    Take 2.5mg  (half tablet) Monday and Friday, 5mg  on all other days   Modified Medications   No medications on file   New Prescriptions   No medications on file    Labs/ tests ordered today include:   Orders Placed This Encounter  Procedures  . Basic Metabolic Panel (BMET)      Disposition:    FU with Dr. Sherren Mocha 2 mos.     Signed, Versie Starks, MHS 06/11/2015 5:33 PM    [facility] [address], [place], [place]  [id number] Phone: [phone]; Fax: [phone]

## 2015-06-11 ENCOUNTER — Encounter (HOSPITAL_COMMUNITY)
Admission: RE | Admit: 2015-06-11 | Discharge: 2015-06-11 | Disposition: A | Discharge status: Home / Self Care | Payer: Medicare Other | Source: Ambulatory Visit | Attending: Cardiovascular Disease | Admitting: Cardiovascular Disease

## 2015-06-11 ENCOUNTER — Ambulatory Visit (INDEPENDENT_AMBULATORY_CARE_PROVIDER_SITE_OTHER): Payer: Medicare Other | Admitting: Physician Assistant

## 2015-06-11 ENCOUNTER — Encounter: Payer: Self-pay | Admitting: Physician Assistant

## 2015-06-11 ENCOUNTER — Encounter (HOSPITAL_COMMUNITY): Payer: Self-pay

## 2015-06-11 VITALS — BP 122/62 | HR 75 | Ht 70.25 in | Wt 175.0 lb

## 2015-06-11 DIAGNOSIS — I5022 Chronic systolic (congestive) heart failure: Secondary | ICD-10-CM

## 2015-06-11 DIAGNOSIS — I428 Other cardiomyopathies: Secondary | ICD-10-CM

## 2015-06-11 DIAGNOSIS — I480 Paroxysmal atrial fibrillation: Secondary | ICD-10-CM

## 2015-06-11 DIAGNOSIS — E785 Hyperlipidemia, unspecified: Secondary | ICD-10-CM | POA: Diagnosis not present

## 2015-06-11 DIAGNOSIS — Z79899 Other long term (current) drug therapy: Secondary | ICD-10-CM | POA: Diagnosis not present

## 2015-06-11 DIAGNOSIS — I255 Ischemic cardiomyopathy: Secondary | ICD-10-CM

## 2015-06-11 DIAGNOSIS — I429 Cardiomyopathy, unspecified: Secondary | ICD-10-CM | POA: Diagnosis not present

## 2015-06-11 DIAGNOSIS — Z95810 Presence of automatic (implantable) cardiac defibrillator: Secondary | ICD-10-CM

## 2015-06-11 DIAGNOSIS — I10 Essential (primary) hypertension: Secondary | ICD-10-CM

## 2015-06-11 DIAGNOSIS — Z48812 Encounter for surgical aftercare following surgery on the circulatory system: Secondary | ICD-10-CM | POA: Diagnosis not present

## 2015-06-11 DIAGNOSIS — Z955 Presence of coronary angioplasty implant and graft: Secondary | ICD-10-CM | POA: Diagnosis not present

## 2015-06-11 DIAGNOSIS — N185 Chronic kidney disease, stage 5: Secondary | ICD-10-CM | POA: Diagnosis not present

## 2015-06-11 LAB — GLUCOSE, CAPILLARY
GLUCOSE-CAPILLARY: 71 mg/dL (ref 65–99)
Glucose-Capillary: 113 mg/dL — ABNORMAL HIGH (ref 65–99)
Glucose-Capillary: 78 mg/dL (ref 65–99)
Glucose-Capillary: 98 mg/dL (ref 65–99)

## 2015-06-11 LAB — BASIC METABOLIC PANEL
BUN: 66 mg/dL — AB (ref 6–23)
CHLORIDE: 102 meq/L (ref 96–112)
CO2: 28 mEq/L (ref 19–32)
Calcium: 9.9 mg/dL (ref 8.4–10.5)
Creatinine, Ser: 4.18 mg/dL — ABNORMAL HIGH (ref 0.40–1.50)
GFR: 14.58 mL/min — CL (ref >60.00)
GLUCOSE: 154 mg/dL — AB (ref 70–99)
POTASSIUM: 3.8 meq/L (ref 3.5–5.1)
SODIUM: 142 meq/L (ref 135–145)

## 2015-06-11 NOTE — Patient Instructions (Signed)
Medication Instructions:  Your physician recommends that you continue on your current medications as directed. Please refer to the Current Medication list given to you today.   Labwork: TODAY BMET  Testing/Procedures: NONE  Follow-Up: 2 MONTHS WITH DR. Burt Knack  Any Other Special Instructions Will Be Listed Below (If Applicable).

## 2015-06-11 NOTE — Progress Notes (Signed)
[name] [name]. 81 y.o. male Nutrition Note Spoke with pt. Pt known to this Probation officer from previous admission. Pre-exercise CBG 113 mg/dL. Post-exercise CBG 78 mg/dL. Pt treated with lemonade and then gingerale. Per discussion, pt ate oatmeal, milk, and a banana for breakfast. Pt encouraged to add protein and a healthy fat (e.g. Egg substitute, egg, or peanut butter) to breakfast for better CBG control with exercise. Pt did not check his fasting CBG this morning stating "I usually check it after I eat." Will monitor CBG's with staff and determine if further intervention needed. Continue client-centered nutrition education by RD as part of interdisciplinary care.  Monitor and evaluate progress toward nutrition goal with team.  [name], M.Ed, RD, LDN, CDE 06/11/2015 10:38 AM

## 2015-06-11 NOTE — Progress Notes (Addendum)
Pt started cardiac rehab today.  Pt tolerated light exercise without difficulty. VSS, telemetry-atrial fibrillation with ventricular pacing, asymptomatic. [pt pre exercise CBG-113, pt ate breakfast of oatmeal, banana and coffee.  Pt given lemonade, however he did not drink it all.  Post exercise CBG:  78.  Pt asymptomatic.  Pt given banana.  15 minute recheck:  73.  Pt given gingerale.  15 minute recheck:  98.   Pt met with [name], RD, Certified diabetes educator, who instructed pt to add more protein to pre exercise meal.  Medication list reconciled.  Pt verbalized compliance with medications and denies barriers to compliance. Pt is unsure if his metoprolol is succinate or tartrate.  Pt asked to bring pill bottle to next scheduled visit for med list verification.  PSYCHOSOCIAL ASSESSMENT:  PHQ-0. Pt exhibits positive coping skills, hopeful outlook with supportive family. Pt is a widower.  However his daughter is  available to assist him as needed.  No psychosocial needs identified at this time, no psychosocial interventions necessary.    Pt enjoys spending time with his grandchildren watching their sporting events.   He has 13 grandchildren total with 5 living in town.  Pt cardiac rehab  goal is  to increase strength and stamina.  Pt encouraged to participate in home exercise activities to increase ability to achieve these goals.   Pt long term cardiac rehab goal is develop daily exercise routine, pt encouraged to participate in exercising on your own education class and personalized home exercise education in addition to actively participating in home exercise routine while enrolled in cardiac rehab to instill the habit.  Pt oriented to exercise equipment and routine.  Understanding verbalized.

## 2015-06-12 ENCOUNTER — Telehealth: Payer: Self-pay | Admitting: *Deleted

## 2015-06-12 NOTE — Telephone Encounter (Signed)
Pt notified of lab results by phone with verbal understanding.  

## 2015-06-13 ENCOUNTER — Encounter (HOSPITAL_COMMUNITY)
Admission: RE | Admit: 2015-06-13 | Discharge: 2015-06-13 | Disposition: A | Discharge status: Home / Self Care | Payer: Medicare Other | Source: Ambulatory Visit | Attending: Cardiovascular Disease | Admitting: Cardiovascular Disease

## 2015-06-13 DIAGNOSIS — Z48812 Encounter for surgical aftercare following surgery on the circulatory system: Secondary | ICD-10-CM | POA: Diagnosis not present

## 2015-06-13 LAB — GLUCOSE, CAPILLARY
GLUCOSE-CAPILLARY: 198 mg/dL — AB (ref 65–99)
GLUCOSE-CAPILLARY: 119 mg/dL — AB (ref 65–99)

## 2015-06-13 NOTE — Progress Notes (Signed)
Pt brought metoprolol pill bottle to cardaic rehab today to confirm he is taking metoprolol succinate 100mg   BID.  Med list reconciled.

## 2015-06-14 ENCOUNTER — Ambulatory Visit (INDEPENDENT_AMBULATORY_CARE_PROVIDER_SITE_OTHER): Payer: Medicare Other | Admitting: *Deleted

## 2015-06-14 DIAGNOSIS — I428 Other cardiomyopathies: Secondary | ICD-10-CM

## 2015-06-14 DIAGNOSIS — I5022 Chronic systolic (congestive) heart failure: Secondary | ICD-10-CM | POA: Diagnosis not present

## 2015-06-14 DIAGNOSIS — I429 Cardiomyopathy, unspecified: Secondary | ICD-10-CM | POA: Diagnosis not present

## 2015-06-14 NOTE — Progress Notes (Signed)
Remote ICD transmission.   

## 2015-06-15 ENCOUNTER — Encounter (HOSPITAL_COMMUNITY)
Admission: RE | Admit: 2015-06-15 | Discharge: 2015-06-15 | Disposition: A | Discharge status: Home / Self Care | Payer: Medicare Other | Source: Ambulatory Visit | Attending: Cardiovascular Disease | Admitting: Cardiovascular Disease

## 2015-06-15 DIAGNOSIS — Z48812 Encounter for surgical aftercare following surgery on the circulatory system: Secondary | ICD-10-CM | POA: Diagnosis not present

## 2015-06-15 LAB — GLUCOSE, CAPILLARY
GLUCOSE-CAPILLARY: 165 mg/dL — AB (ref 65–99)
Glucose-Capillary: 89 mg/dL (ref 65–99)

## 2015-06-18 ENCOUNTER — Encounter (HOSPITAL_COMMUNITY)
Admission: RE | Admit: 2015-06-18 | Discharge: 2015-06-18 | Disposition: A | Discharge status: Home / Self Care | Payer: Medicare Other | Source: Ambulatory Visit | Attending: Cardiovascular Disease | Admitting: Cardiovascular Disease

## 2015-06-18 DIAGNOSIS — Z48812 Encounter for surgical aftercare following surgery on the circulatory system: Secondary | ICD-10-CM | POA: Diagnosis not present

## 2015-06-18 LAB — CUP PACEART REMOTE DEVICE CHECK
HighPow Impedance: 39 Ohm
Lead Channel Pacing Threshold Amplitude: 1 V
Lead Channel Pacing Threshold Pulse Width: 0.6 ms
Lead Channel Sensing Intrinsic Amplitude: 12 mV
Lead Channel Setting Sensing Sensitivity: 0.5 mV
MDC IDC MSMT BATTERY REMAINING LONGEVITY: 12 mo
MDC IDC MSMT BATTERY REMAINING PERCENTAGE: 28 %
MDC IDC MSMT BATTERY VOLTAGE: 2.83 V
MDC IDC MSMT LEADCHNL LV IMPEDANCE VALUE: 380 Ohm
MDC IDC MSMT LEADCHNL LV PACING THRESHOLD AMPLITUDE: 4 V
MDC IDC MSMT LEADCHNL LV PACING THRESHOLD PULSEWIDTH: 1 ms
MDC IDC MSMT LEADCHNL RV IMPEDANCE VALUE: 380 Ohm
MDC IDC SESS DTM: 20160922062648
MDC IDC SET LEADCHNL LV PACING AMPLITUDE: 5 V
MDC IDC SET LEADCHNL LV PACING PULSEWIDTH: 1 ms
MDC IDC SET LEADCHNL RV PACING AMPLITUDE: 2.5 V
MDC IDC SET LEADCHNL RV PACING PULSEWIDTH: 0.6 ms
Pulse Gen Serial Number: 595859
Zone Setting Detection Interval: 250 ms
Zone Setting Detection Interval: 300 ms

## 2015-06-18 LAB — GLUCOSE, CAPILLARY: Glucose-Capillary: 232 mg/dL — ABNORMAL HIGH (ref 65–99)

## 2015-06-18 NOTE — Progress Notes (Signed)
Reviewed home exercise with pt today.  Pt plans to go to [facility] for exercise, in addition to [facility].  Reviewed THR, pulse, RPE, sign and symptoms, NTG use, and when to call 911 or MD.  Pt voiced understanding.    Amber [employer]

## 2015-06-19 ENCOUNTER — Other Ambulatory Visit: Payer: Self-pay | Admitting: Internal Medicine

## 2015-06-20 ENCOUNTER — Encounter (HOSPITAL_COMMUNITY): Payer: Medicare Other

## 2015-06-20 ENCOUNTER — Encounter (HOSPITAL_COMMUNITY)
Admission: RE | Admit: 2015-06-20 | Discharge: 2015-06-20 | Disposition: A | Discharge status: Home / Self Care | Payer: Medicare Other | Source: Ambulatory Visit | Attending: Cardiovascular Disease | Admitting: Cardiovascular Disease

## 2015-06-20 DIAGNOSIS — Z48812 Encounter for surgical aftercare following surgery on the circulatory system: Secondary | ICD-10-CM | POA: Diagnosis not present

## 2015-06-20 LAB — GLUCOSE, CAPILLARY
GLUCOSE-CAPILLARY: 216 mg/dL — AB (ref 65–99)
Glucose-Capillary: 156 mg/dL — ABNORMAL HIGH (ref 65–99)

## 2015-06-20 NOTE — Progress Notes (Signed)
Pt transferred from 8:15 to 9:45am cardiac rehab class for pt time preference.    QUALITY OF LIFE SCORE REVIEW Patient quality of life slightly altered by physical constraints which limits ability to perform as prior to recent cardiac illness.  Specifically pt c/o dyspnea with activity and decreased energy.  Pt denies dyspnea at rest or orthopnea. Pt is a widower, lives alone.   He enjoys spending time with his daughter and grandchildren.  Offered emotional support and reassurance.  Will continue to monitor and intervene as necessary.

## 2015-06-22 ENCOUNTER — Encounter (HOSPITAL_COMMUNITY): Payer: Medicare Other

## 2015-06-22 ENCOUNTER — Encounter (HOSPITAL_COMMUNITY)
Admission: RE | Admit: 2015-06-22 | Discharge: 2015-06-22 | Disposition: A | Discharge status: Home / Self Care | Payer: Medicare Other | Source: Ambulatory Visit | Attending: Cardiovascular Disease | Admitting: Cardiovascular Disease

## 2015-06-22 DIAGNOSIS — Z48812 Encounter for surgical aftercare following surgery on the circulatory system: Secondary | ICD-10-CM | POA: Diagnosis not present

## 2015-06-22 LAB — GLUCOSE, CAPILLARY
GLUCOSE-CAPILLARY: 209 mg/dL — AB (ref 65–99)
GLUCOSE-CAPILLARY: 144 mg/dL — AB (ref 65–99)

## 2015-06-25 ENCOUNTER — Encounter (HOSPITAL_COMMUNITY)
Admission: RE | Admit: 2015-06-25 | Discharge: 2015-06-25 | Disposition: A | Discharge status: Home / Self Care | Payer: Medicare Other | Source: Ambulatory Visit | Attending: Cardiovascular Disease | Admitting: Cardiovascular Disease

## 2015-06-25 ENCOUNTER — Telehealth: Payer: Self-pay | Admitting: Cardiovascular Disease

## 2015-06-25 ENCOUNTER — Encounter (HOSPITAL_COMMUNITY): Payer: Medicare Other

## 2015-06-25 DIAGNOSIS — Z79899 Other long term (current) drug therapy: Secondary | ICD-10-CM | POA: Insufficient documentation

## 2015-06-25 DIAGNOSIS — Z48812 Encounter for surgical aftercare following surgery on the circulatory system: Secondary | ICD-10-CM | POA: Insufficient documentation

## 2015-06-25 DIAGNOSIS — Z955 Presence of coronary angioplasty implant and graft: Secondary | ICD-10-CM | POA: Diagnosis not present

## 2015-06-25 LAB — GLUCOSE, CAPILLARY
GLUCOSE-CAPILLARY: 116 mg/dL — AB (ref 65–99)
Glucose-Capillary: 198 mg/dL — ABNORMAL HIGH (ref 65–99)

## 2015-06-25 MED ORDER — FUROSEMIDE 80 MG PO TABS: ORAL_TABLET | ORAL | Status: DC

## 2015-06-25 NOTE — Telephone Encounter (Signed)
Yes he is.  I verified on original phone call but forgot to mention.  My apologies.

## 2015-06-25 NOTE — Progress Notes (Signed)
[name] [name]. 81 y.o. male Nutrition Note Spoke with pt. Pt concerned about his weight. Pt wt is down 6 lb over the past 9 months. Pt c/o decreased appetite and denies depression at this time. Pt states his appetite has been low since his wife's death. Pt with stage IV CKD. Per discussion, pt has not been instructed to change his diet "other than for diabetes." Nutrition Plan reviewed with pt. Nutrition Survey not reviewed due to pt desire to gain wt. Pt educated re: wt gain and increasing kcal consumed. Pt is diabetic. Last A1c indicates blood glucose well-controlled. Pt checks CBG's 2 times a day. CBG's reportedly WNL for DM. Pt with dx of CHF. Per discussion, pt does not use canned/convenience foods often. Pt rarely adds salt to food. Pt expressed understanding of the information reviewed. Pt aware of nutrition education classes offered.  Lab Results  Component Value Date   HGBA1C 6.8* 05/22/2015    Nutrition Diagnosis ? Food-and nutrition-related knowledge deficit related to lack of exposure to information as related to diagnosis of: ? CVD ? DM ? Excessive mineral intake (phosphorus) (NI-5.10.2.6) related to limited knowledge of dietary restrictions as evidenced by inability to name sources of dietary phosphorus and consumption of high-phosphorus foods.  Nutrition RX/ Estimated Daily Nutrition Needs for: wt  maintenance 2100-2400 Kcal, 70-80 gm fat, 14-16 gm sat fat, 2.1-2.4 gm trans-fat, <1500 mg sodium, 250-325 gm CHO   Nutrition Intervention ? Pt's individual nutrition plan reviewed with pt. ? Pt to add a high calorie supplement between meals or as a meal replacement if needed. ? Will fax Dr. Lorrene Reid re: Diet restrictions/ CKD education desired. ? Pt to attend the Portion Distortion class ? Pt to attend the Diabetes Q & A class ? Pt to attend the   ? Nutrition I class                  ? Nutrition II class     ? Diabetes Blitz class ? Pt given handouts for: High Calorie  Nutrition, a list of High Calorie, lower protein supplements (e.g. Resource Breeze) ? Continue client-centered nutrition education by RD, as part of interdisciplinary care. Goal(s) ? Pt to identify food quantities necessary to achieve wt maintenance/prevent further wt loss at graduation from cardiac rehab.  ? Pt to describe the benefit of including fruits, vegetables, whole grains, and low-fat dairy products in a heart healthy meal plan. ? Use pre-meal and post-meal CBG's and A1c to determine whether adjustments in food/meal planning will be beneficial or if any meds need to be combined with nutrition therapy. or and Evaluate progress toward nutrition goal with team. Nutrition Risk: Change to Moderate [name], M.Ed, RD, LDN, CDE 06/25/2015 11:34 AM

## 2015-06-25 NOTE — Telephone Encounter (Signed)
Continue lasix 80 mg BID and take an extra dose prn weight gain of 3# or symptoms. thx

## 2015-06-25 NOTE — Telephone Encounter (Signed)
New Message  Pt seen in ED 2 weeks ago for similar symptoms. Please call back and discuss.  Pt c/o Shortness Of Breath: STAT if SOB developed within the last 24 hours or pt is noticeably SOB on the phone  1. Are you currently SOB (can you hear that pt is SOB on the phone)? no  2. How long have you been experiencing SOB? Began this weekend  3. Are you SOB when sitting or when up moving around? All the time  4. Are you currently experiencing any other symptoms? no

## 2015-06-25 NOTE — Telephone Encounter (Signed)
Spoke with pt and advised him of Dr. Antionette Char rec. Pt verbalized understanding and was in agreement with this plan.

## 2015-06-25 NOTE — Telephone Encounter (Signed)
Pt states that he was told to call with wt gain after d/c from hospital. As of Saturday pt had gained 6 lbs since d/c, was SOB and had hacking cough. Pt took extra Lasix 80 mg at that time and dropped 2.5lbs overnight and has felt fine since. Pt went to Cardiac Rehab this morning and had no problems. Pt states the way he felt Saturday is how he felt just prior to being hospitalized. Weights as follows: Sat- 175.5, Sun- 173, Mon- 173.5. Pt would like to know if Dr. Burt Knack feels that his Lasix needs to be increased or if there are any other recommendations. Will forward to Dr. Burt Knack for review.

## 2015-06-25 NOTE — Telephone Encounter (Signed)
Is he currently taking lasix 80 mg BID?

## 2015-06-25 NOTE — Telephone Encounter (Signed)
lmtcb

## 2015-06-26 ENCOUNTER — Telehealth: Payer: Self-pay | Admitting: Cardiovascular Disease

## 2015-06-26 NOTE — Telephone Encounter (Signed)
New Message       Pt calling stating that he had an appt with Richardson Dopp and was told he needed to f/u w/ Dr. Burt Knack in 2 months and that when he went to check out, they told him that Dr. Antionette Char nurse would call him with an appt. Pt wants to speak to Lauren about this. Please call back.

## 2015-06-26 NOTE — Telephone Encounter (Signed)
The pt is to f/u with Dr Burt Knack in 11/16 per his OV with Richardson Dopp, PA-c on 06/11/15. He states that he was told at check out that Ander Purpura would call him to schedule his f/u with Dr Burt Knack.  He is calling today to find out when his appt is. He is advised that Ander Purpura is out of the office this week and that I am forwarding this message to her to address when she returns. He verbalized understanding.

## 2015-06-27 ENCOUNTER — Encounter (HOSPITAL_COMMUNITY)
Admission: RE | Admit: 2015-06-27 | Discharge: 2015-06-27 | Disposition: A | Discharge status: Home / Self Care | Payer: Medicare Other | Source: Ambulatory Visit | Attending: Cardiovascular Disease | Admitting: Cardiovascular Disease

## 2015-06-27 ENCOUNTER — Encounter (HOSPITAL_COMMUNITY): Payer: Medicare Other

## 2015-06-27 DIAGNOSIS — Z48812 Encounter for surgical aftercare following surgery on the circulatory system: Secondary | ICD-10-CM | POA: Diagnosis not present

## 2015-06-27 LAB — GLUCOSE, CAPILLARY
Glucose-Capillary: 279 mg/dL — ABNORMAL HIGH (ref 65–99)
Glucose-Capillary: 173 mg/dL — ABNORMAL HIGH (ref 65–99)

## 2015-06-27 NOTE — Progress Notes (Signed)
[name] [name] 81 y.o. male  Nutrition Note Spoke with Dr. Lorrene Reid re: pt diet. Per discussion, pt previously educated re: low phosphorus diet. Pt reports he does not recall low phosphorus education, but did remember discussing the need to take a phosphorus binder. Pt educated re: low phosphorus diet. Phosphorus content of pt diet discussed. Pt states he consumes 8-16 ounces of whole milk daily. Pt encouraged to decrease cow's milk consumption to 4 ounces daily. Pt willing to decrease milk consumption and plans on substituting sugar-free tea/lemonade for milk. Pt expressed understanding of the low phosphorus diet. Continue client-centered nutrition education by RD as part of interdisciplinary care.  Monitor and evaluate progress toward nutrition goal with team.  [name], M.Ed, RD, LDN, CDE 06/27/2015 12:06 PM

## 2015-06-28 ENCOUNTER — Ambulatory Visit: Payer: Medicare Other

## 2015-06-28 ENCOUNTER — Ambulatory Visit (INDEPENDENT_AMBULATORY_CARE_PROVIDER_SITE_OTHER): Payer: Medicare Other

## 2015-06-28 DIAGNOSIS — Z5181 Encounter for therapeutic drug level monitoring: Secondary | ICD-10-CM | POA: Diagnosis not present

## 2015-06-28 DIAGNOSIS — I482 Chronic atrial fibrillation, unspecified: Secondary | ICD-10-CM

## 2015-06-28 LAB — POCT INR
INR: 3.6
INR: 3.6
INR: 3.6

## 2015-06-29 ENCOUNTER — Ambulatory Visit: Payer: Medicare Other | Admitting: Family

## 2015-06-29 ENCOUNTER — Ambulatory Visit (INDEPENDENT_AMBULATORY_CARE_PROVIDER_SITE_OTHER): Payer: Medicare Other | Admitting: Family

## 2015-06-29 ENCOUNTER — Encounter (HOSPITAL_COMMUNITY)
Admission: RE | Admit: 2015-06-29 | Discharge: 2015-06-29 | Disposition: A | Discharge status: Home / Self Care | Payer: Medicare Other | Source: Ambulatory Visit | Attending: Cardiovascular Disease | Admitting: Cardiovascular Disease

## 2015-06-29 ENCOUNTER — Telehealth: Payer: Self-pay | Admitting: Family

## 2015-06-29 ENCOUNTER — Encounter (HOSPITAL_COMMUNITY): Payer: Medicare Other

## 2015-06-29 DIAGNOSIS — Z5181 Encounter for therapeutic drug level monitoring: Secondary | ICD-10-CM

## 2015-06-29 DIAGNOSIS — I482 Chronic atrial fibrillation, unspecified: Secondary | ICD-10-CM

## 2015-06-29 DIAGNOSIS — Z48812 Encounter for surgical aftercare following surgery on the circulatory system: Secondary | ICD-10-CM | POA: Diagnosis not present

## 2015-06-29 LAB — GLUCOSE, CAPILLARY
GLUCOSE-CAPILLARY: 202 mg/dL — AB (ref 65–99)
Glucose-Capillary: 252 mg/dL — ABNORMAL HIGH (ref 65–99)

## 2015-06-29 NOTE — Telephone Encounter (Signed)
Hold Coumadin x 1 day. Then continue 1/2 tab on Mondays and 1 tab all other days. Recheck in 2 weeks.

## 2015-06-29 NOTE — Patient Instructions (Signed)
Hold Coumadin x 1 day. Then continue 1/2 tab on Mondays and 1 tab all other days. Recheck in 2 weeks.   Anticoagulation Dose Instructions as of 06/29/2015      Dorene Grebe Tue Wed Thu Fri Sat   New Dose 5 mg 2.5 mg 5 mg 5 mg 5 mg 5 mg 5 mg    Description        Hold Coumadin x 1 day. Then continue 1/2 tab on Mondays and 1 tab all other days. Recheck in 2 weeks.

## 2015-06-29 NOTE — Telephone Encounter (Signed)
Left a message for return call.  

## 2015-06-29 NOTE — Telephone Encounter (Signed)
Called and spoke with pt and pt is aware.  

## 2015-07-02 ENCOUNTER — Encounter (HOSPITAL_COMMUNITY): Payer: Medicare Other

## 2015-07-02 ENCOUNTER — Encounter (HOSPITAL_COMMUNITY)
Admission: RE | Admit: 2015-07-02 | Discharge: 2015-07-02 | Disposition: A | Discharge status: Home / Self Care | Payer: Medicare Other | Source: Ambulatory Visit | Attending: Cardiovascular Disease | Admitting: Cardiovascular Disease

## 2015-07-02 ENCOUNTER — Ambulatory Visit (INDEPENDENT_AMBULATORY_CARE_PROVIDER_SITE_OTHER): Payer: Medicare Other | Admitting: Internal Medicine

## 2015-07-02 ENCOUNTER — Encounter: Payer: Self-pay | Admitting: Internal Medicine

## 2015-07-02 VITALS — BP 140/70 | HR 75 | Temp 98.2°F | Resp 20 | Ht 70.25 in | Wt 173.0 lb

## 2015-07-02 DIAGNOSIS — N189 Chronic kidney disease, unspecified: Secondary | ICD-10-CM

## 2015-07-02 DIAGNOSIS — I255 Ischemic cardiomyopathy: Secondary | ICD-10-CM | POA: Diagnosis not present

## 2015-07-02 DIAGNOSIS — I10 Essential (primary) hypertension: Secondary | ICD-10-CM

## 2015-07-02 DIAGNOSIS — I5020 Unspecified systolic (congestive) heart failure: Secondary | ICD-10-CM | POA: Diagnosis not present

## 2015-07-02 DIAGNOSIS — Z23 Encounter for immunization: Secondary | ICD-10-CM | POA: Diagnosis not present

## 2015-07-02 DIAGNOSIS — I2510 Atherosclerotic heart disease of native coronary artery without angina pectoris: Secondary | ICD-10-CM

## 2015-07-02 DIAGNOSIS — N184 Chronic kidney disease, stage 4 (severe): Secondary | ICD-10-CM

## 2015-07-02 DIAGNOSIS — I482 Chronic atrial fibrillation, unspecified: Secondary | ICD-10-CM

## 2015-07-02 DIAGNOSIS — Z48812 Encounter for surgical aftercare following surgery on the circulatory system: Secondary | ICD-10-CM | POA: Diagnosis not present

## 2015-07-02 DIAGNOSIS — E0821 Diabetes mellitus due to underlying condition with diabetic nephropathy: Secondary | ICD-10-CM

## 2015-07-02 LAB — GLUCOSE, CAPILLARY
GLUCOSE-CAPILLARY: 95 mg/dL (ref 65–99)
Glucose-Capillary: 206 mg/dL — ABNORMAL HIGH (ref 65–99)

## 2015-07-02 NOTE — Patient Instructions (Signed)
Cardiology follow-up as scheduled  Limit your sodium (Salt) intake  Daily weights.  Titrate furosemide as instructed  Return in 3 months for follow-up

## 2015-07-02 NOTE — Progress Notes (Signed)
Subjective:    Patient ID: [name] [name]., male    DOB: [birth year], 81 y.o.   MRN: [id number]  HPI   81 year old patient with multiple medical problems who has had 4 hospital admissions this year for acute on chronic heart failure.  His last discharge was on September 14.  He is followed closely by cardiology.  He has a history of diabetes and chronic renal failure.  He has had a fistula placed in the right upper arm.  He is on Coumadin anticoagulation. He is on a regimen of Lasix 80 mg twice daily and takes additional furosemide with weight gain.  Weight gain has been associated with increasing cough and shortness of breath.  Weight is down 5 pounds over the past 2 days with improvement of his nonproductive cough  Lab Results  Component Value Date   HGBA1C 6.8* 05/22/2015   He remains on mixed split insulin therapy.  Denies any chest pain.  Today he feels well.  Continues to benefit from cardiac rehabilitation  Past Medical History  Diagnosis Date  . Chronic atrial fibrillation ([place])     a. Historically difficult rate control. b. s/p CRT-D implantation with anticipation of AV junction ablation but patient then was able to accomplish rate control and ablation not undertaken.; AVN ablation 08/2013 by Dr Lovena Le  . CARDIOMYOPATHY, PRIMARY, DILATED     a. Mixed ICM/NICM - EF 15% by echo 05/2009; St. Jude CRT-D implanted 06/2009. b. Echo 04/2013 - EF 20%, mild LVH.  [name] Chronic systolic CHF (congestive heart failure) ([place])   . CORONARY ARTERY DISEASE     a. BMS to LAD 04/2008. b. Cath 06/2009: nonobstructive disease. c. cath 05/16/2015 DES to prox LAD  . SLEEP APNEA   . Hypertension   . Hyperlipidemia   . Diabetes mellitus   . Osteoarthritis   . Tubular adenoma of colon   . Secondary hyperparathyroidism ([place])   . CKD (chronic kidney disease), stage IV ([place])     a. Right brachiocephalic AV fistula 01/353. Neph = [place].  . Vertigo   . Valvular heart disease     a. Echo 04/2013: mild AI,  mild MR.  . Pulmonary HTN ([place])     a. PA pressure 34mmHg by echo 05/01/13.  . Prostate cancer [facility])     a. s/p radical prostatectomy.  . Colon cancer ([place])   . Skin cancer   . Presence of permanent cardiac pacemaker     Social History   Social History  . Marital Status: Widowed    Spouse Name: N/A  . Number of Children: 4  . Years of Education: N/A   Occupational History  . retired    Social History Main Topics  . Smoking status: Former Smoker    Types: Cigarettes    Quit date: 09/22/1966  . Smokeless tobacco: Never Used  . Alcohol Use: 0.0 - 0.6 oz/week    0-1 Standard drinks or equivalent per week     Comment: 05/07/2015 "might have a couple drinks/month"  . Drug Use: No  . Sexual Activity: No   Other Topics Concern  . Not on file   Social History Narrative    Past Surgical History  Procedure Laterality Date  . Colectomy    . Prostatectomy    . Penile prosthesis placement    . Removed prosthetic eye    . Ptca      stent placed  . Total knee arthroplasty  2008    right  .  Pacemaker insertion  2010  . Av fistula placement  03/30/2012    Procedure: ARTERIOVENOUS (AV) FISTULA CREATION;  Surgeon: Angelia Mould, MD;  Location: [place];  Service: Vascular;  Laterality: Right;  Creation of BrachioCephalic Fistula Right arm  . Eye surgery      left eye prothesis  . Fracture surgery      collar bone, right knee replacement  . Bi-ventricular implantable cardioverter defibrillator  (crt-d)  2010    STJ CRTD implanted by Dr [name]  . Ablation  08/2013    AVN ablation by Dr Lovena Le  . Av node ablation N/A 09/12/2013    Procedure: AV NODE ABLATION;  Surgeon: [name], MD;  Location: [facility] CATH LAB;  Service: Cardiovascular;  Laterality: N/A;  . Insert / replace / remove pacemaker    . Joint replacement    . Cardiac catheterization N/A 05/09/2015    Procedure: Left Heart Cath and Coronary Angiography;  Surgeon: Sherren Mocha, MD;  Location: [place] CV LAB;   Service: Cardiovascular;  Laterality: N/A;  . Cardiac catheterization N/A 05/16/2015    Procedure: Coronary Stent Intervention;  Surgeon: Sherren Mocha, MD;  Location: [place] CV LAB;  Service: Cardiovascular;  Laterality: N/A;    Family History  Problem Relation Age of Onset  . Heart disease Father   . Throat cancer Father   . Throat cancer Mother   . Heart disease Mother   . Hypertension Mother     Allergies  Allergen Reactions  . Ace Inhibitors Other (See Comments)    Stopped by Dr. Burt Knack due to progressive renal failure  . Prednisone Other (See Comments)    Raised blood sugar to almost 900    Current Outpatient Prescriptions on File Prior to Visit  Medication Sig Dispense Refill  . acetaminophen (TYLENOL) 500 MG tablet Take 500 mg by mouth as needed for mild pain.     . calcitRIOL (ROCALTROL) 0.25 MCG capsule Take 0.25 mcg by mouth. Monday, Wed and Friday    . calcium carbonate (TUMS EX) 750 MG chewable tablet Chew 1 tablet by mouth 3 (three) times daily with meals.    . clopidogrel (PLAVIX) 75 MG tablet Take 1 tablet (75 mg total) by mouth daily. 90 tablet 2  . DIGOX 125 MCG tablet Take 1 tablet by mouth  every other day 45 tablet 1  . furosemide (LASIX) 80 MG tablet Take 1 tablet two times daily.  May take one extra tablet daily for symptoms or 3 pound weight gain. 210 tablet 2  . hydrALAZINE (APRESOLINE) 25 MG tablet Take 1 and 1/2 tablets by  mouth 3 times daily 315 tablet 0  . insulin lispro protamine-lispro (HUMALOG 75/25 MIX) (75-25) 100 UNIT/ML SUSP injection Inject 26 Units into the skin 2 (two) times daily with a meal. 6 vial 1  . isosorbide mononitrate (IMDUR) 30 MG 24 hr tablet Take 1 tablet (30 mg total) by mouth daily. 30 tablet 6  . metoprolol succinate (TOPROL-XL) 100 MG 24 hr tablet Take 1 tablet (100 mg total) by mouth 2 (two) times daily. Take with or immediately following a meal. 60 tablet 0  . potassium chloride SA (K-DUR,KLOR-CON) 20 MEQ tablet Take 2  tablets (40 mEq total) by mouth daily. 60 tablet 3  . warfarin (COUMADIN) 5 MG tablet Take by mouth daily. Take 2.5 mg (one-half tablet) by mouth Mondays, and one whole tablet by mouth al other days (tue, Wed, Thur, Fri, Sat, Sun)  No current facility-administered medications on file prior to visit.    BP 140/70 mmHg  Pulse 75  Temp(Src) 98.2 F (36.8 C) (Oral)  Resp 20  Ht 5' 10.25" (1.784 m)  Wt 173 lb (78.472 kg)  BMI 24.66 kg/m2  SpO2 97%     Review of Systems  Constitutional: Negative for fever, chills, appetite change and fatigue.  HENT: Negative for congestion, dental problem, ear pain, hearing loss, sore throat, tinnitus, trouble swallowing and voice change.   Eyes: Negative for pain, discharge and visual disturbance.  Respiratory: Positive for cough and shortness of breath. Negative for chest tightness, wheezing and stridor.   Cardiovascular: Negative for chest pain, palpitations and leg swelling.  Gastrointestinal: Negative for nausea, vomiting, abdominal pain, diarrhea, constipation, blood in stool and abdominal distention.  Genitourinary: Negative for urgency, hematuria, flank pain, discharge, difficulty urinating and genital sores.  Musculoskeletal: Negative for myalgias, back pain, joint swelling, arthralgias, gait problem and neck stiffness.  Skin: Negative for rash.  Neurological: Negative for dizziness, syncope, speech difficulty, weakness, numbness and headaches.  Hematological: Negative for adenopathy. Does not bruise/bleed easily.  Psychiatric/Behavioral: Negative for behavioral problems and dysphoric mood. The patient is not nervous/anxious.        Objective:   Physical Exam  Constitutional: He is oriented to person, place, and time. He appears well-developed.  HENT:  Head: Normocephalic.  Right Ear: External ear normal.  Left Ear: External ear normal.  Eyes: Conjunctivae and EOM are normal.  Neck: Normal range of motion. JVD present.  Prominent  jugular venous pressure  Cardiovascular: Normal rate and normal heart sounds.   Frequent ectopics  Pulmonary/Chest: Breath sounds normal. No respiratory distress. He has no wheezes.  Defibrillator noted  Abdominal: Bowel sounds are normal.  Musculoskeletal: Normal range of motion. He exhibits no edema or tenderness.  No clinical edema  Neurological: He is alert and oriented to person, place, and time.  Skin:  Fistula noted.  Right upper arm with palpable thrill  Psychiatric: He has a normal mood and affect. His behavior is normal.          Assessment & Plan:   Ischemic cardio myopathy Chronic atrial fibrillation Chronic anticoagulation Diabetes mellitus   We'll continue daily weights and salt restrictions and careful titration of diuretic therapy Cardiology follow-up as scheduled Continue cardiac rehabilitation Cardiology follow-up as scheduled Follow-up here as scheduled.  Will check hemoglobin A1c next visit

## 2015-07-02 NOTE — Progress Notes (Signed)
Pre visit review using our clinic review tool, if applicable. No additional management support is needed unless otherwise documented below in the visit note. 

## 2015-07-03 ENCOUNTER — Encounter: Payer: Self-pay | Admitting: Cardiology

## 2015-07-03 ENCOUNTER — Other Ambulatory Visit: Payer: Self-pay | Admitting: Internal Medicine

## 2015-07-04 ENCOUNTER — Encounter (HOSPITAL_COMMUNITY)
Admission: RE | Admit: 2015-07-04 | Discharge: 2015-07-04 | Disposition: A | Discharge status: Home / Self Care | Payer: Medicare Other | Source: Ambulatory Visit | Attending: Cardiovascular Disease | Admitting: Cardiovascular Disease

## 2015-07-04 ENCOUNTER — Encounter (HOSPITAL_COMMUNITY): Payer: Medicare Other

## 2015-07-04 DIAGNOSIS — Z48812 Encounter for surgical aftercare following surgery on the circulatory system: Secondary | ICD-10-CM | POA: Diagnosis not present

## 2015-07-04 LAB — GLUCOSE, CAPILLARY
GLUCOSE-CAPILLARY: 137 mg/dL — AB (ref 65–99)
GLUCOSE-CAPILLARY: 82 mg/dL (ref 65–99)
Glucose-Capillary: 103 mg/dL — ABNORMAL HIGH (ref 65–99)

## 2015-07-05 LAB — GLUCOSE, CAPILLARY: Glucose-Capillary: 72 mg/dL (ref 65–99)

## 2015-07-06 ENCOUNTER — Encounter (HOSPITAL_COMMUNITY)
Admission: RE | Admit: 2015-07-06 | Discharge: 2015-07-06 | Disposition: A | Discharge status: Home / Self Care | Payer: Medicare Other | Source: Ambulatory Visit | Attending: Cardiovascular Disease | Admitting: Cardiovascular Disease

## 2015-07-06 ENCOUNTER — Encounter (HOSPITAL_COMMUNITY): Payer: Medicare Other

## 2015-07-06 DIAGNOSIS — Z48812 Encounter for surgical aftercare following surgery on the circulatory system: Secondary | ICD-10-CM | POA: Diagnosis not present

## 2015-07-06 LAB — GLUCOSE, CAPILLARY
Glucose-Capillary: 188 mg/dL — ABNORMAL HIGH (ref 65–99)
Glucose-Capillary: 108 mg/dL — ABNORMAL HIGH (ref 65–99)

## 2015-07-09 ENCOUNTER — Encounter (HOSPITAL_COMMUNITY): Payer: Medicare Other

## 2015-07-09 ENCOUNTER — Encounter (HOSPITAL_COMMUNITY)
Admission: RE | Admit: 2015-07-09 | Discharge: 2015-07-09 | Disposition: A | Discharge status: Home / Self Care | Payer: Medicare Other | Source: Ambulatory Visit | Attending: Cardiovascular Disease | Admitting: Cardiovascular Disease

## 2015-07-09 DIAGNOSIS — Z48812 Encounter for surgical aftercare following surgery on the circulatory system: Secondary | ICD-10-CM | POA: Diagnosis not present

## 2015-07-09 LAB — GLUCOSE, CAPILLARY
GLUCOSE-CAPILLARY: 236 mg/dL — AB (ref 65–99)
GLUCOSE-CAPILLARY: 237 mg/dL — AB (ref 65–99)

## 2015-07-11 ENCOUNTER — Encounter (HOSPITAL_COMMUNITY)
Admission: RE | Admit: 2015-07-11 | Discharge: 2015-07-11 | Disposition: A | Discharge status: Home / Self Care | Payer: Medicare Other | Source: Ambulatory Visit | Attending: Cardiovascular Disease | Admitting: Cardiovascular Disease

## 2015-07-11 ENCOUNTER — Encounter (HOSPITAL_COMMUNITY): Payer: Medicare Other

## 2015-07-11 DIAGNOSIS — Z48812 Encounter for surgical aftercare following surgery on the circulatory system: Secondary | ICD-10-CM | POA: Diagnosis not present

## 2015-07-11 LAB — GLUCOSE, CAPILLARY: Glucose-Capillary: 255 mg/dL — ABNORMAL HIGH (ref 65–99)

## 2015-07-12 ENCOUNTER — Encounter: Payer: Self-pay | Admitting: Internal Medicine

## 2015-07-12 ENCOUNTER — Ambulatory Visit (INDEPENDENT_AMBULATORY_CARE_PROVIDER_SITE_OTHER): Payer: Medicare Other | Admitting: General Practice

## 2015-07-12 DIAGNOSIS — I482 Chronic atrial fibrillation, unspecified: Secondary | ICD-10-CM

## 2015-07-12 DIAGNOSIS — Z5181 Encounter for therapeutic drug level monitoring: Secondary | ICD-10-CM | POA: Diagnosis not present

## 2015-07-12 LAB — POCT INR: INR: 2.1

## 2015-07-12 NOTE — Progress Notes (Signed)
Pre visit review using our clinic review tool, if applicable. No additional management support is needed unless otherwise documented below in the visit note. 

## 2015-07-13 ENCOUNTER — Encounter (HOSPITAL_COMMUNITY)
Admission: RE | Admit: 2015-07-13 | Discharge: 2015-07-13 | Disposition: A | Discharge status: Home / Self Care | Payer: Medicare Other | Source: Ambulatory Visit | Attending: Cardiovascular Disease | Admitting: Cardiovascular Disease

## 2015-07-13 ENCOUNTER — Encounter (HOSPITAL_COMMUNITY): Payer: Medicare Other

## 2015-07-13 DIAGNOSIS — Z48812 Encounter for surgical aftercare following surgery on the circulatory system: Secondary | ICD-10-CM | POA: Diagnosis not present

## 2015-07-13 LAB — GLUCOSE, CAPILLARY
GLUCOSE-CAPILLARY: 145 mg/dL — AB (ref 65–99)
Glucose-Capillary: 243 mg/dL — ABNORMAL HIGH (ref 65–99)

## 2015-07-13 NOTE — Telephone Encounter (Signed)
Left message on machine for pt to contact the office.   

## 2015-07-16 ENCOUNTER — Encounter (HOSPITAL_COMMUNITY)
Admission: RE | Admit: 2015-07-16 | Discharge: 2015-07-16 | Disposition: A | Discharge status: Home / Self Care | Payer: Medicare Other | Source: Ambulatory Visit | Attending: Cardiovascular Disease | Admitting: Cardiovascular Disease

## 2015-07-16 ENCOUNTER — Encounter (HOSPITAL_COMMUNITY): Payer: Medicare Other

## 2015-07-16 DIAGNOSIS — Z48812 Encounter for surgical aftercare following surgery on the circulatory system: Secondary | ICD-10-CM | POA: Diagnosis not present

## 2015-07-16 LAB — GLUCOSE, CAPILLARY: Glucose-Capillary: 298 mg/dL — ABNORMAL HIGH (ref 65–99)

## 2015-07-16 NOTE — Telephone Encounter (Signed)
I spoke with the pt and have scheduled him to see Dr Burt Knack on 08/09/15 at 4:00.

## 2015-07-16 NOTE — Telephone Encounter (Signed)
F/u   Pt returning your call and please call him back.

## 2015-07-18 ENCOUNTER — Encounter (HOSPITAL_COMMUNITY): Payer: Medicare Other

## 2015-07-18 ENCOUNTER — Encounter (HOSPITAL_COMMUNITY)
Admission: RE | Admit: 2015-07-18 | Discharge: 2015-07-18 | Disposition: A | Discharge status: Home / Self Care | Payer: Medicare Other | Source: Ambulatory Visit | Attending: Cardiovascular Disease | Admitting: Cardiovascular Disease

## 2015-07-18 DIAGNOSIS — Z48812 Encounter for surgical aftercare following surgery on the circulatory system: Secondary | ICD-10-CM | POA: Diagnosis not present

## 2015-07-18 LAB — GLUCOSE, CAPILLARY
GLUCOSE-CAPILLARY: 154 mg/dL — AB (ref 65–99)
GLUCOSE-CAPILLARY: 105 mg/dL — AB (ref 65–99)

## 2015-07-20 ENCOUNTER — Encounter (HOSPITAL_COMMUNITY): Payer: Medicare Other

## 2015-07-23 ENCOUNTER — Encounter (HOSPITAL_COMMUNITY): Payer: Medicare Other

## 2015-07-25 ENCOUNTER — Encounter (HOSPITAL_COMMUNITY)
Admission: RE | Admit: 2015-07-25 | Discharge: 2015-07-25 | Disposition: A | Discharge status: Home / Self Care | Payer: Medicare Other | Source: Ambulatory Visit | Attending: Cardiovascular Disease | Admitting: Cardiovascular Disease

## 2015-07-25 ENCOUNTER — Encounter (HOSPITAL_COMMUNITY): Payer: Medicare Other

## 2015-07-25 DIAGNOSIS — Z955 Presence of coronary angioplasty implant and graft: Secondary | ICD-10-CM | POA: Diagnosis not present

## 2015-07-25 DIAGNOSIS — Z79899 Other long term (current) drug therapy: Secondary | ICD-10-CM | POA: Insufficient documentation

## 2015-07-25 DIAGNOSIS — Z48812 Encounter for surgical aftercare following surgery on the circulatory system: Secondary | ICD-10-CM | POA: Diagnosis present

## 2015-07-25 LAB — GLUCOSE, CAPILLARY
GLUCOSE-CAPILLARY: 176 mg/dL — AB (ref 65–99)
GLUCOSE-CAPILLARY: 133 mg/dL — AB (ref 65–99)

## 2015-07-27 ENCOUNTER — Encounter (HOSPITAL_COMMUNITY): Payer: Medicare Other

## 2015-07-27 ENCOUNTER — Encounter (HOSPITAL_COMMUNITY)
Admission: RE | Admit: 2015-07-27 | Discharge: 2015-07-27 | Disposition: A | Discharge status: Home / Self Care | Payer: Medicare Other | Source: Ambulatory Visit | Attending: Cardiovascular Disease | Admitting: Cardiovascular Disease

## 2015-07-27 DIAGNOSIS — Z48812 Encounter for surgical aftercare following surgery on the circulatory system: Secondary | ICD-10-CM | POA: Diagnosis not present

## 2015-07-27 LAB — GLUCOSE, CAPILLARY: GLUCOSE-CAPILLARY: 289 mg/dL — AB (ref 65–99)

## 2015-07-30 ENCOUNTER — Encounter (HOSPITAL_COMMUNITY)
Admission: RE | Admit: 2015-07-30 | Discharge: 2015-07-30 | Disposition: A | Discharge status: Home / Self Care | Payer: Medicare Other | Source: Ambulatory Visit | Attending: Cardiovascular Disease | Admitting: Cardiovascular Disease

## 2015-07-30 ENCOUNTER — Encounter (HOSPITAL_COMMUNITY): Payer: Medicare Other

## 2015-07-30 DIAGNOSIS — Z48812 Encounter for surgical aftercare following surgery on the circulatory system: Secondary | ICD-10-CM | POA: Diagnosis not present

## 2015-07-30 LAB — GLUCOSE, CAPILLARY
Glucose-Capillary: 144 mg/dL — ABNORMAL HIGH (ref 65–99)
Glucose-Capillary: 116 mg/dL — ABNORMAL HIGH (ref 65–99)

## 2015-08-01 ENCOUNTER — Encounter (HOSPITAL_COMMUNITY): Payer: Medicare Other

## 2015-08-01 ENCOUNTER — Encounter (HOSPITAL_COMMUNITY)
Admission: RE | Admit: 2015-08-01 | Discharge: 2015-08-01 | Disposition: A | Discharge status: Home / Self Care | Payer: Medicare Other | Source: Ambulatory Visit | Attending: Cardiovascular Disease | Admitting: Cardiovascular Disease

## 2015-08-01 DIAGNOSIS — Z48812 Encounter for surgical aftercare following surgery on the circulatory system: Secondary | ICD-10-CM | POA: Diagnosis not present

## 2015-08-01 LAB — GLUCOSE, CAPILLARY
GLUCOSE-CAPILLARY: 207 mg/dL — AB (ref 65–99)
GLUCOSE-CAPILLARY: 128 mg/dL — AB (ref 65–99)

## 2015-08-03 ENCOUNTER — Encounter (HOSPITAL_COMMUNITY)
Admission: RE | Admit: 2015-08-03 | Discharge: 2015-08-03 | Disposition: A | Discharge status: Home / Self Care | Payer: Medicare Other | Source: Ambulatory Visit | Attending: Cardiovascular Disease | Admitting: Cardiovascular Disease

## 2015-08-03 ENCOUNTER — Encounter (HOSPITAL_COMMUNITY): Payer: Medicare Other

## 2015-08-03 DIAGNOSIS — Z48812 Encounter for surgical aftercare following surgery on the circulatory system: Secondary | ICD-10-CM | POA: Diagnosis not present

## 2015-08-03 LAB — GLUCOSE, CAPILLARY
Glucose-Capillary: 200 mg/dL — ABNORMAL HIGH (ref 65–99)
Glucose-Capillary: 148 mg/dL — ABNORMAL HIGH (ref 65–99)

## 2015-08-06 ENCOUNTER — Encounter (HOSPITAL_COMMUNITY): Payer: Medicare Other

## 2015-08-06 ENCOUNTER — Encounter (HOSPITAL_COMMUNITY)
Admission: RE | Admit: 2015-08-06 | Discharge: 2015-08-06 | Disposition: A | Discharge status: Home / Self Care | Payer: Medicare Other | Source: Ambulatory Visit | Attending: Cardiovascular Disease | Admitting: Cardiovascular Disease

## 2015-08-06 DIAGNOSIS — Z48812 Encounter for surgical aftercare following surgery on the circulatory system: Secondary | ICD-10-CM | POA: Diagnosis not present

## 2015-08-06 LAB — GLUCOSE, CAPILLARY: Glucose-Capillary: 139 mg/dL — ABNORMAL HIGH (ref 65–99)

## 2015-08-07 LAB — GLUCOSE, CAPILLARY: GLUCOSE-CAPILLARY: 215 mg/dL — AB (ref 65–99)

## 2015-08-08 ENCOUNTER — Encounter (HOSPITAL_COMMUNITY)
Admission: RE | Admit: 2015-08-08 | Discharge: 2015-08-08 | Disposition: A | Discharge status: Home / Self Care | Payer: Medicare Other | Source: Ambulatory Visit | Attending: Cardiovascular Disease | Admitting: Cardiovascular Disease

## 2015-08-08 ENCOUNTER — Encounter (HOSPITAL_COMMUNITY): Payer: Medicare Other

## 2015-08-08 DIAGNOSIS — Z48812 Encounter for surgical aftercare following surgery on the circulatory system: Secondary | ICD-10-CM | POA: Diagnosis not present

## 2015-08-08 LAB — GLUCOSE, CAPILLARY
GLUCOSE-CAPILLARY: 200 mg/dL — AB (ref 65–99)
Glucose-Capillary: 182 mg/dL — ABNORMAL HIGH (ref 65–99)

## 2015-08-09 ENCOUNTER — Ambulatory Visit (INDEPENDENT_AMBULATORY_CARE_PROVIDER_SITE_OTHER): Payer: Medicare Other | Admitting: General Practice

## 2015-08-09 ENCOUNTER — Other Ambulatory Visit: Payer: Self-pay | Admitting: *Deleted

## 2015-08-09 ENCOUNTER — Other Ambulatory Visit: Payer: Self-pay | Admitting: General Practice

## 2015-08-09 ENCOUNTER — Ambulatory Visit (INDEPENDENT_AMBULATORY_CARE_PROVIDER_SITE_OTHER): Payer: Medicare Other | Admitting: Cardiovascular Disease

## 2015-08-09 ENCOUNTER — Encounter: Payer: Self-pay | Admitting: Cardiovascular Disease

## 2015-08-09 VITALS — BP 138/60 | HR 74 | Ht 70.0 in | Wt 177.2 lb

## 2015-08-09 DIAGNOSIS — I482 Chronic atrial fibrillation, unspecified: Secondary | ICD-10-CM

## 2015-08-09 DIAGNOSIS — I5022 Chronic systolic (congestive) heart failure: Secondary | ICD-10-CM | POA: Diagnosis not present

## 2015-08-09 DIAGNOSIS — Z5181 Encounter for therapeutic drug level monitoring: Secondary | ICD-10-CM | POA: Diagnosis not present

## 2015-08-09 DIAGNOSIS — I255 Ischemic cardiomyopathy: Secondary | ICD-10-CM | POA: Diagnosis not present

## 2015-08-09 DIAGNOSIS — I2510 Atherosclerotic heart disease of native coronary artery without angina pectoris: Secondary | ICD-10-CM | POA: Diagnosis not present

## 2015-08-09 LAB — POCT INR: INR: 2.4

## 2015-08-09 MED ORDER — WARFARIN SODIUM 5 MG PO TABS: ORAL_TABLET | ORAL | Status: DC

## 2015-08-09 MED ORDER — ISOSORBIDE MONONITRATE ER 30 MG PO TB24: 30.0000 mg | ORAL_TABLET | Freq: Every day | ORAL | Status: DC

## 2015-08-09 NOTE — Progress Notes (Addendum)
Cardiology Office Note Date:  08/09/2015   ID:  [name] [name]., DOB 1930/11/29, MRN [id number]  PCP:  [name] Cowden, MD  Cardiologist:  [name] Mocha, MD    Chief Complaint  Patient presents with  . Follow-up    chronic afib/CHF   History of Present Illness: [name] [name]. is a 81 y.o. male who presents for follow-up of CAD, chronic systolic heart failure.  He underwent stenting of the LAD in 2009. He also has atrial fibrillation, stage IV chronic kidney disease, and underwent AV node ablation and 2014.  Patient had progressive dyspnea in August in 2016 and ultimately underwent cardiac catheterization demonstrating severe proximal LAD stenosis. He underwent PCI with a drug-eluting stent at that time. He is treated with Plavix and warfarin.  Overall he is doing well. Eager to go to [place] where he hasn't been able to visit his son in many years. He feels that cardiac rehab is helping him regain strength and stamina. He denies edema, orthopnea, or PND. No chest pain or pressure.   Past Medical History  Diagnosis Date  . Chronic atrial fibrillation ([place])     a. Historically difficult rate control. b. s/p CRT-D implantation with anticipation of AV junction ablation but patient then was able to accomplish rate control and ablation not undertaken.; AVN ablation 08/2013 by Dr [name] [name]  . CARDIOMYOPATHY, PRIMARY, DILATED     a. Mixed ICM/NICM - EF 15% by echo 05/2009; St. Jude CRT-D implanted 06/2009. b. Echo 04/2013 - EF 20%, mild LVH.  [name] Chronic systolic CHF (congestive heart failure) ([place])   . CORONARY ARTERY DISEASE     a. BMS to LAD 04/2008. b. Cath 06/2009: nonobstructive disease. c. cath 05/16/2015 DES to prox LAD  . SLEEP APNEA   . Hypertension   . Hyperlipidemia   . Diabetes mellitus   . Osteoarthritis   . Tubular adenoma of colon   . Secondary hyperparathyroidism ([place])   . CKD (chronic kidney disease), stage IV ([place])     a. Right  brachiocephalic AV fistula [id number]. Neph = [place].  . Vertigo   . Valvular heart disease     a. Echo 04/2013: mild AI, mild MR.  . Pulmonary HTN ([place])     a. PA pressure 104mmHg by echo 05/01/13.  . Prostate cancer [facility])     a. s/p radical prostatectomy.  . Colon cancer ([place])   . Skin cancer   . Presence of permanent cardiac pacemaker     Past Surgical History  Procedure Laterality Date  . Colectomy    . Prostatectomy    . Penile prosthesis placement    . Removed prosthetic eye    . Ptca      stent placed  . Total knee arthroplasty  2008    right  . Pacemaker insertion  2010  . Av fistula placement  03/30/2012    Procedure: ARTERIOVENOUS (AV) FISTULA CREATION;  Surgeon: Angelia Mould, MD;  Location: [place];  Service: Vascular;  Laterality: Right;  Creation of BrachioCephalic Fistula Right arm  . Eye surgery      left eye prothesis  . Fracture surgery      collar bone, right knee replacement  . Bi-ventricular implantable cardioverter defibrillator  (crt-d)  2010    STJ CRTD implanted by Dr [name]  . Ablation  08/2013    AVN ablation by Dr [name] [name]  . Av node ablation N/A 09/12/2013    Procedure: AV NODE ABLATION;  Surgeon: [name], MD;  Location: [facility] CATH LAB;  Service: Cardiovascular;  Laterality: N/A;  . Insert / replace / remove pacemaker    . Joint replacement    . Cardiac catheterization N/A 05/09/2015    Procedure: Left Heart Cath and Coronary Angiography;  Surgeon: [name] Mocha, MD;  Location: [place] CV LAB;  Service: Cardiovascular;  Laterality: N/A;  . Cardiac catheterization N/A 05/16/2015    Procedure: Coronary Stent Intervention;  Surgeon: [name] Mocha, MD;  Location: [place] CV LAB;  Service: Cardiovascular;  Laterality: N/A;    Current Outpatient Prescriptions  Medication Sig Dispense Refill  . acetaminophen (TYLENOL) 500 MG tablet Take 500 mg by mouth as needed for mild pain.     . calcitRIOL (ROCALTROL) 0.25 MCG capsule Take 0.25 mcg by  mouth. Monday, Wed and Friday    . calcium carbonate (TUMS EX) 750 MG chewable tablet Chew 1 tablet by mouth 3 (three) times daily with meals.    . clopidogrel (PLAVIX) 75 MG tablet Take 1 tablet (75 mg total) by mouth daily. 90 tablet 2  . DIGOX 125 MCG tablet Take 1 tablet by mouth  every other day 45 tablet 1  . furosemide (LASIX) 80 MG tablet Take 1 tablet two times daily.  May take one extra tablet daily for symptoms or 3 pound weight gain. 210 tablet 2  . HUMALOG MIX 75/25 (75-25) 100 UNIT/ML SUSP injection Inject subcutaneously 26  units two times daily with  meals 40 mL 1  . hydrALAZINE (APRESOLINE) 25 MG tablet Take 1 and 1/2 tablets by  mouth 3 times daily 315 tablet 0  . isosorbide mononitrate (IMDUR) 30 MG 24 hr tablet Take 1 tablet (30 mg total) by mouth daily. 30 tablet 6  . metoprolol succinate (TOPROL-XL) 100 MG 24 hr tablet Take 1 tablet (100 mg total) by mouth 2 (two) times daily. Take with or immediately following a meal. 60 tablet 0  . potassium chloride SA (K-DUR,KLOR-CON) 20 MEQ tablet Take 2 tablets (40 mEq total) by mouth daily. 60 tablet 3  . warfarin (COUMADIN) 5 MG tablet Take 2.5 mg (one-half tablet) by mouth Mondays, and one whole tablet by mouth al other days (tue, Wed, Thur, Fri, Sat, Sun) 90 tablet 1   No current facility-administered medications for this visit.    Allergies:   Ace inhibitors and Prednisone   Social History:  The patient  reports that he quit smoking about 48 years ago. His smoking use included Cigarettes. He has never used smokeless tobacco. He reports that he drinks alcohol. He reports that he does not use illicit drugs.   Family History:  The patient's  family history includes Heart disease in his father and mother; Hypertension in his mother; Throat cancer in his father and mother.    ROS:  Please see the history of present illness.  Otherwise, review of systems is positive for cough, fatigue.  All other systems are reviewed and negative.     PHYSICAL EXAM: VS:  BP 138/60 mmHg  Pulse 74  Ht 5\' 10"  (1.778 m)  Wt 177 lb 3.2 oz (80.377 kg)  BMI 25.43 kg/m2  SpO2 98% , BMI Body mass index is 25.43 kg/(m^2). GEN: pleasant, elderly male, in no acute distress HEENT: normal Neck: no JVD, no masses. No carotid bruits Cardiac: RRR without murmur or gallop                Respiratory:  clear to auscultation bilaterally, normal work of  breathing GI: soft, nontender, nondistended, + BS MS: no deformity or atrophy Ext: no pretibial edema, pedal pulses 2+= bilaterally Skin: warm and dry, no rash Neuro:  Strength and sensation are intact Psych: euthymic mood, full affect  EKG:  EKG is not ordered today.  Recent Labs: 06/04/2015: ALT 19; B Natriuretic Peptide 1655.4*; Magnesium 2.3; TSH 0.925 06/05/2015: Hemoglobin 11.3*; Platelets 99* 06/11/2015: BUN 66*; Creatinine, Ser 4.18*; Potassium 3.8; Sodium 142   Lipid Panel     Component Value Date/Time   CHOL 172 05/08/2015 0535   TRIG 211* 05/08/2015 0535   TRIG 136 08/17/2006 0807   HDL 25* 05/08/2015 0535   CHOLHDL 6.9 05/08/2015 0535   CHOLHDL 3.6 CALC 08/17/2006 0807   VLDL 42* 05/08/2015 0535   LDLCALC 105* 05/08/2015 0535   LDLDIRECT 102.7 08/03/2012 0929   LDLDIRECT 123.1 07/06/2006 0837      Wt Readings from Last 3 Encounters:  08/09/15 177 lb 3.2 oz (80.377 kg)  07/02/15 173 lb (78.472 kg)  06/11/15 175 lb (79.379 kg)    Cardiac Studies Reviewed: 2D Echo March 2016: Study Conclusions  - Left ventricle: The cavity size was mildly dilated. Wall thickness was increased in a pattern of mild LVH. Systolic function was severely reduced. The estimated ejection fraction was in the range of 25% to 30%. Diffuse hypokinesis. - Aortic valve: Sclerosis without stenosis. There was mild regurgitation. - Mitral valve: There was mild regurgitation. - Left atrium: The atrium was mildly dilated. - Right atrium: The atrium was mildly dilated. - Pulmonary arteries: PA  peak pressure: 48 mm Hg (S). ASSESSMENT AND PLAN: 1. CAD: Status post DES to the proximal LAD.Plavix is to be continued for at least 6 months along with warfarin. I would like him to return for follow-up in 3 months and at that time it would be reasonable to stop Plavix and start ASA 81 mg daily.  2. Chronic systolic CHF, NYHA II:  He is not on ACE inhibitor secondary to advanced chronic kidney disease. Continue beta blocker, hydralazine, nitrates. Complicated by advanced kidney disease.   3. HTN with CKD V: Controlled.   4. Status post CRT-D: Follow up with EP as planned.   5. Hyperlipidemia: we again discussed pros/cons of statin Rx today. He is not interested in statin or non-statin drugs  6. Paroxysmal Atrial Fibrillation: on chronic warfarin without bleeding problems.   Current medicines are reviewed with the patient today.  The patient does not have concerns regarding medicines.  Labs/ tests ordered today include:  No orders of the defined types were placed in this encounter.    Disposition:   FU 3 months with [name] Dopp, PA-C. Consider transition from plavix to ASA at that time.  Deatra James, MD  08/09/2015 4:25 PM    [facility] [address], [place], [place]  [address] Phone: [phone]; Fax: [phone]

## 2015-08-09 NOTE — Progress Notes (Signed)
Pre visit review using our clinic review tool, if applicable. No additional management support is needed unless otherwise documented below in the visit note. 

## 2015-08-09 NOTE — Patient Instructions (Signed)
Medication Instructions:  Your physician recommends that you continue on your current medications as directed. Please refer to the Current Medication list given to you today.  Labwork: No new orders.   Testing/Procedures: No new orders.   Follow-Up: Your physician recommends that you schedule a follow-up appointment in: 3 MONTHS with Scott Weaver PA-C  Any Other Special Instructions Will Be Listed Below (If Applicable).  If you need a refill on your cardiac medications before your next appointment, please call your pharmacy. 

## 2015-08-10 ENCOUNTER — Encounter (HOSPITAL_COMMUNITY): Payer: Medicare Other

## 2015-08-10 ENCOUNTER — Encounter (HOSPITAL_COMMUNITY)
Admission: RE | Admit: 2015-08-10 | Discharge: 2015-08-10 | Disposition: A | Discharge status: Home / Self Care | Payer: Medicare Other | Source: Ambulatory Visit | Attending: Cardiovascular Disease | Admitting: Cardiovascular Disease

## 2015-08-10 DIAGNOSIS — Z48812 Encounter for surgical aftercare following surgery on the circulatory system: Secondary | ICD-10-CM | POA: Diagnosis not present

## 2015-08-10 LAB — GLUCOSE, CAPILLARY
GLUCOSE-CAPILLARY: 235 mg/dL — AB (ref 65–99)
Glucose-Capillary: 137 mg/dL — ABNORMAL HIGH (ref 65–99)

## 2015-08-10 NOTE — Progress Notes (Signed)
I am ok with this plan  

## 2015-08-13 ENCOUNTER — Encounter (HOSPITAL_COMMUNITY): Payer: Medicare Other

## 2015-08-13 ENCOUNTER — Encounter (HOSPITAL_COMMUNITY)
Admission: RE | Admit: 2015-08-13 | Discharge: 2015-08-13 | Disposition: A | Discharge status: Home / Self Care | Payer: Medicare Other | Source: Ambulatory Visit | Attending: Cardiovascular Disease | Admitting: Cardiovascular Disease

## 2015-08-13 DIAGNOSIS — Z48812 Encounter for surgical aftercare following surgery on the circulatory system: Secondary | ICD-10-CM | POA: Diagnosis not present

## 2015-08-13 LAB — GLUCOSE, CAPILLARY
Glucose-Capillary: 276 mg/dL — ABNORMAL HIGH (ref 65–99)
Glucose-Capillary: 205 mg/dL — ABNORMAL HIGH (ref 65–99)

## 2015-08-15 ENCOUNTER — Encounter (HOSPITAL_COMMUNITY)
Admission: RE | Admit: 2015-08-15 | Discharge: 2015-08-15 | Disposition: A | Discharge status: Home / Self Care | Payer: Medicare Other | Source: Ambulatory Visit | Attending: Cardiovascular Disease | Admitting: Cardiovascular Disease

## 2015-08-15 ENCOUNTER — Encounter (HOSPITAL_COMMUNITY): Payer: Medicare Other

## 2015-08-15 DIAGNOSIS — Z48812 Encounter for surgical aftercare following surgery on the circulatory system: Secondary | ICD-10-CM | POA: Diagnosis not present

## 2015-08-15 LAB — GLUCOSE, CAPILLARY
GLUCOSE-CAPILLARY: 276 mg/dL — AB (ref 65–99)
Glucose-Capillary: 153 mg/dL — ABNORMAL HIGH (ref 65–99)

## 2015-08-17 ENCOUNTER — Encounter (HOSPITAL_COMMUNITY): Payer: Medicare Other

## 2015-08-20 ENCOUNTER — Encounter (HOSPITAL_COMMUNITY): Payer: Medicare Other

## 2015-08-20 ENCOUNTER — Encounter (HOSPITAL_COMMUNITY)
Admission: RE | Admit: 2015-08-20 | Discharge: 2015-08-20 | Disposition: A | Discharge status: Home / Self Care | Payer: Medicare Other | Source: Ambulatory Visit | Attending: Cardiovascular Disease | Admitting: Cardiovascular Disease

## 2015-08-20 DIAGNOSIS — Z48812 Encounter for surgical aftercare following surgery on the circulatory system: Secondary | ICD-10-CM | POA: Diagnosis not present

## 2015-08-20 LAB — GLUCOSE, CAPILLARY
Glucose-Capillary: 226 mg/dL — ABNORMAL HIGH (ref 65–99)
Glucose-Capillary: 199 mg/dL — ABNORMAL HIGH (ref 65–99)

## 2015-08-22 ENCOUNTER — Encounter (HOSPITAL_COMMUNITY)
Admission: RE | Admit: 2015-08-22 | Discharge: 2015-08-22 | Disposition: A | Discharge status: Home / Self Care | Payer: Medicare Other | Source: Ambulatory Visit | Attending: Cardiovascular Disease | Admitting: Cardiovascular Disease

## 2015-08-22 ENCOUNTER — Encounter (HOSPITAL_COMMUNITY): Payer: Medicare Other

## 2015-08-22 ENCOUNTER — Ambulatory Visit: Payer: Medicare Other | Admitting: Internal Medicine

## 2015-08-22 DIAGNOSIS — Z48812 Encounter for surgical aftercare following surgery on the circulatory system: Secondary | ICD-10-CM | POA: Diagnosis not present

## 2015-08-22 LAB — GLUCOSE, CAPILLARY
GLUCOSE-CAPILLARY: 114 mg/dL — AB (ref 65–99)
Glucose-Capillary: 162 mg/dL — ABNORMAL HIGH (ref 65–99)
Glucose-Capillary: 86 mg/dL (ref 65–99)
Glucose-Capillary: 89 mg/dL (ref 65–99)

## 2015-08-22 NOTE — Progress Notes (Signed)
Jerry's post exercise CBG was 89 this morning patient asymptomatic. Sonia Side was given lemonade. Recheck CBG 86. Patient was given orange juice and graham crackers. Recheck CBG 114. Patient left cardiac rehab without complaints.

## 2015-08-23 ENCOUNTER — Other Ambulatory Visit: Payer: Self-pay

## 2015-08-23 DIAGNOSIS — I5020 Unspecified systolic (congestive) heart failure: Secondary | ICD-10-CM

## 2015-08-23 DIAGNOSIS — Z79899 Other long term (current) drug therapy: Secondary | ICD-10-CM

## 2015-08-23 NOTE — Patient Outreach (Signed)
[facility]) Care Management  08/23/2015  Takoma Schuelke. [birth year] [id number]   SUBJECTIVE:  Screening call to patient regarding heart failure readmission referral. HIPAA verified with patient.  Discussed and offered [facility] care management program to patient.  Patient verbally agreed to program.   Patient states, "I had a stent placed in my heart around September/August 2016."  Patient states he is presently in Cardiac rehab 3 days per week, Monday Wednesday and Friday's from 9-12 noon.  Patient states he has been in the hospital 2 times since having the stent placement. Patient states has a hard time due to retaining fluid.  Patient states his doctor told him to take an extra lasix if he starts having an increase in swelling.  Patient states he is having difficulty managing the fluid build up and weight gain.  Patient states his ankles and feet swell and he will also get short of breath. Patient states he weighs himself daily and records weight. Patient denies shortness of breath today. States he does have some swelling in his legs and feet.  Patient denies knowledge of heart failure action plan and zones.  Patient states he has a defibrillator. Patient states he has loss vision completely in his left eye.  States this happened years ago.  Patient state he lives alone. States he lost his wife approximately 1 1/2 year ago.  Patient reports he sees Dr. Sherren Mocha, cardiologist and Dr. Jamal Maes, kidney specialist.  ASSESSMENT: Heart Failure readmission referral  PLAN; RNCM will refer patient to community nurse case manager and pharmacy.   Quinn Plowman RN,BSN,CCM [place] Coordinator [phone]'

## 2015-08-24 ENCOUNTER — Encounter (HOSPITAL_COMMUNITY)
Admission: RE | Admit: 2015-08-24 | Discharge: 2015-08-24 | Disposition: A | Discharge status: Home / Self Care | Payer: Medicare Other | Source: Ambulatory Visit | Attending: Cardiovascular Disease | Admitting: Cardiovascular Disease

## 2015-08-24 ENCOUNTER — Encounter (HOSPITAL_COMMUNITY): Payer: Medicare Other

## 2015-08-24 DIAGNOSIS — Z955 Presence of coronary angioplasty implant and graft: Secondary | ICD-10-CM | POA: Diagnosis not present

## 2015-08-24 DIAGNOSIS — Z79899 Other long term (current) drug therapy: Secondary | ICD-10-CM | POA: Insufficient documentation

## 2015-08-24 DIAGNOSIS — Z48812 Encounter for surgical aftercare following surgery on the circulatory system: Secondary | ICD-10-CM | POA: Insufficient documentation

## 2015-08-24 LAB — GLUCOSE, CAPILLARY
Glucose-Capillary: 154 mg/dL — ABNORMAL HIGH (ref 65–99)
Glucose-Capillary: 99 mg/dL (ref 65–99)

## 2015-08-27 ENCOUNTER — Encounter (HOSPITAL_COMMUNITY): Payer: Medicare Other

## 2015-08-27 ENCOUNTER — Encounter (HOSPITAL_COMMUNITY)
Admission: RE | Admit: 2015-08-27 | Discharge: 2015-08-27 | Disposition: A | Discharge status: Home / Self Care | Payer: Medicare Other | Source: Ambulatory Visit | Attending: Cardiovascular Disease | Admitting: Cardiovascular Disease

## 2015-08-27 DIAGNOSIS — Z48812 Encounter for surgical aftercare following surgery on the circulatory system: Secondary | ICD-10-CM | POA: Diagnosis not present

## 2015-08-27 LAB — GLUCOSE, CAPILLARY
GLUCOSE-CAPILLARY: 218 mg/dL — AB (ref 65–99)
GLUCOSE-CAPILLARY: 137 mg/dL — AB (ref 65–99)

## 2015-08-28 ENCOUNTER — Other Ambulatory Visit: Payer: Self-pay | Admitting: *Deleted

## 2015-08-28 LAB — HM DIABETES FOOT EXAM: HM Diabetic Foot Exam: NORMAL

## 2015-08-28 LAB — HM DIABETES EYE EXAM

## 2015-08-28 NOTE — Patient Outreach (Signed)
[facility]) Care Management  08/28/2015  Sylvester Romberger 04-04-1931 [id number]   Assessment: Care coordination Referral received from telephonic care management coordinator Lorin Glass) for heart failure management and education. Call placed and spoke with patient. Care management coordinator introduced self and explained purpose of the call.  Patient confirmed that he is aware of the referral. Per patient, he lives alone after wife passed away 1 1/2 years ago. He is self-sustaining with his activities of daily living; managing all his finances and housekeeping. He also reports managing his own medications and takes them as instructed.  He reports ambulating with no assistive device needed.  He transports himself to his medical appointments.  According to patient, he had a stent placed in 8/16 and attends cardiac rehab 3 days a week M-W-F from 9 am -12 noon. He has a Forensic scientist.  Patient reports weighing self daily and recording results.  He states taking Lasix to prevent fluid build-up to lower extremities.  Patient reports no oxygen use but has CPAP that he uses at night for sleep apnea. He had verbalized some difficulty managing his heart failure and inadequate knowledge on heart failure zones.  He verbalized being most interested of reading materials as method of educating him because he can read at his own pace.  Patient denies any immediate concerns or issues at this time. He agreed to a home visit next week. Encouraged to call [facility], care management coordinator or 24-hour nurse line if necessary. Contact numbers with patient.   Plan: Initial home visit on 09/06/15. Will provide [facility] packet for heart failure.  Chaniah Cisse A. Henry Demeritt, BSN, RN-BC [place] Management Coordinator Cell: [phone]

## 2015-08-29 ENCOUNTER — Encounter (HOSPITAL_COMMUNITY)
Admission: RE | Admit: 2015-08-29 | Discharge: 2015-08-29 | Disposition: A | Discharge status: Home / Self Care | Payer: Medicare Other | Source: Ambulatory Visit | Attending: Cardiovascular Disease | Admitting: Cardiovascular Disease

## 2015-08-29 ENCOUNTER — Encounter (HOSPITAL_COMMUNITY): Payer: Medicare Other

## 2015-08-29 DIAGNOSIS — Z48812 Encounter for surgical aftercare following surgery on the circulatory system: Secondary | ICD-10-CM | POA: Diagnosis not present

## 2015-08-29 LAB — GLUCOSE, CAPILLARY: GLUCOSE-CAPILLARY: 134 mg/dL — AB (ref 65–99)

## 2015-08-30 ENCOUNTER — Encounter: Payer: Self-pay | Admitting: Internal Medicine

## 2015-08-30 LAB — GLUCOSE, CAPILLARY: GLUCOSE-CAPILLARY: 181 mg/dL — AB (ref 65–99)

## 2015-08-31 ENCOUNTER — Encounter (HOSPITAL_COMMUNITY)
Admission: RE | Admit: 2015-08-31 | Discharge: 2015-08-31 | Disposition: A | Discharge status: Home / Self Care | Payer: Medicare Other | Source: Ambulatory Visit | Attending: Cardiovascular Disease | Admitting: Cardiovascular Disease

## 2015-08-31 ENCOUNTER — Encounter (HOSPITAL_COMMUNITY): Payer: Medicare Other

## 2015-08-31 DIAGNOSIS — Z48812 Encounter for surgical aftercare following surgery on the circulatory system: Secondary | ICD-10-CM | POA: Diagnosis not present

## 2015-08-31 LAB — GLUCOSE, CAPILLARY
GLUCOSE-CAPILLARY: 202 mg/dL — AB (ref 65–99)
GLUCOSE-CAPILLARY: 246 mg/dL — AB (ref 65–99)

## 2015-09-03 ENCOUNTER — Encounter (HOSPITAL_COMMUNITY): Payer: Medicare Other

## 2015-09-03 ENCOUNTER — Encounter (HOSPITAL_COMMUNITY)
Admission: RE | Admit: 2015-09-03 | Discharge: 2015-09-03 | Disposition: A | Discharge status: Home / Self Care | Payer: Medicare Other | Source: Ambulatory Visit | Attending: Cardiovascular Disease | Admitting: Cardiovascular Disease

## 2015-09-03 ENCOUNTER — Other Ambulatory Visit: Payer: Self-pay | Admitting: Pharmacist

## 2015-09-03 DIAGNOSIS — Z48812 Encounter for surgical aftercare following surgery on the circulatory system: Secondary | ICD-10-CM | POA: Diagnosis not present

## 2015-09-03 LAB — GLUCOSE, CAPILLARY
GLUCOSE-CAPILLARY: 172 mg/dL — AB (ref 65–99)
GLUCOSE-CAPILLARY: 109 mg/dL — AB (ref 65–99)

## 2015-09-03 NOTE — Patient Outreach (Signed)
[facility]) Care Management  [place]   09/03/2015  Bilal Kaller. 03-10-1931 [id number]  Subjective: [name] [name]. is a very pleasant  81 y.o. male referred to pharmacy for medication management related to polypharmacy. Reviewed patient's medication, allergy and problem lists. Called and spoke with Mr. Robertson who reviewed each of his medications with me including name, strength, dosing and administration. Patient reports that he manages his own medications and seems to be a good historian of his own medication history. Patient reports that he is closely managed by the anticoagulation clinic, cardiology and his kidney doctor. Reports that he has an appointment with his kidney doctor tomorrow. Patient reports that he started taking a multivitamin following his most recent hospital admission a couple of months ago. Advise patient to bring this with his other medications with him to his visit with his nephrologist tomorrow. Counsel patient to ask his nephrologist about his taking this specific supplement. Patient states that he will do this.  Mr. Henige reports that he takes Tums as directed by his nephrologist for his levels, not for acid.  Objective:   Current Medications: Current Outpatient Prescriptions  Medication Sig Dispense Refill  . acetaminophen (TYLENOL) 500 MG tablet Take 500 mg by mouth as needed for mild pain.     . calcitRIOL (ROCALTROL) 0.25 MCG capsule Take 0.25 mcg by mouth. Monday, Wed and Friday    . calcium carbonate (TUMS EX) 750 MG chewable tablet Chew 1 tablet by mouth 3 (three) times daily with meals.    . clopidogrel (PLAVIX) 75 MG tablet Take 1 tablet (75 mg total) by mouth daily. 90 tablet 2  . DIGOX 125 MCG tablet Take 1 tablet by mouth  every other day 45 tablet 1  . furosemide (LASIX) 80 MG tablet Take 1 tablet two times daily.  May take one extra tablet daily for symptoms or 3 pound weight gain. 210 tablet 2  . HUMALOG MIX  75/25 (75-25) 100 UNIT/ML SUSP injection Inject subcutaneously 26  units two times daily with  meals 40 mL 1  . hydrALAZINE (APRESOLINE) 25 MG tablet Take 1 and 1/2 tablets by  mouth 3 times daily 315 tablet 0  . isosorbide mononitrate (IMDUR) 30 MG 24 hr tablet Take 1 tablet (30 mg total) by mouth daily. 30 tablet 3  . metoprolol succinate (TOPROL-XL) 100 MG 24 hr tablet Take 1 tablet (100 mg total) by mouth 2 (two) times daily. Take with or immediately following a meal. 60 tablet 0  . Multiple Vitamin (MULTIVITAMIN) tablet Take 1 tablet by mouth daily.    . potassium chloride SA (K-DUR,KLOR-CON) 20 MEQ tablet Take 2 tablets (40 mEq total) by mouth daily. 60 tablet 3  . warfarin (COUMADIN) 5 MG tablet Take 2.5 mg (one-half tablet) by mouth Mondays, and one whole tablet by mouth al other days (tue, Wed, Thur, Fri, Sat, Sun) 90 tablet 1   No current facility-administered medications for this visit.    Assessment:  Drugs sorted by system:  Cardiovascular: clopidogrel, digoxin, metoprolol, furosemide, hydralazine, isosorbide, warfarin  Endocrine: Humalog Mix  Renal: calcitriol  Pain: acetaminophen  Vitamins/Minerals: Multivitamin, potassium, calcium carbonate   Duplications in therapy: none noted Gaps in therapy: Patient with congestive heart failure with reduced ejection fraction not on ACE inhibitor or ARB. However, note per EPIC allergy/intolerance documentation, patient previously on an ACE inhibitor that was stopped by his Cardiologist due to progressive  renal failure Medications to avoid in the elderly:  none noted Drug interactions: . Calcitriol + Multivitamin: Multivitamins/Minerals (with ADEK, Folate, Iron) may enhance the adverse/toxic effect of Vitamin D Analogs. . Multivitamin + calcium carbonate: may decrease the serum concentration of Multivitamins/Minerals (with ADEK, Folate, Iron). Specifically, antacids may decrease the absorption of orally administered  iron. . Warfarin + Plavix: Clopidogrel may enhance the anticoagulant effect of Warfarin. Note patient to be on this dual therapy per Cardiologist note from 08/09/15 due to status post DES to the proximal LAD. Per note, Cardiologist planning 3 month follow up to consider switching clopidogrel to aspirin at that time.    Plan: 1) Patient to follow up with his nephrologist at his visit with her tomorrow about his having started an over the counter multivitamin. Patient to bring this medication bottles with him to this appointment. 2) Will close pharmacy episode for now, but patient provided my phone number and states that he will call with future medication questions.  Harlow Asa, PharmD Clinical Pharmacist [place] Management [phone]

## 2015-09-05 ENCOUNTER — Encounter: Payer: Self-pay | Admitting: Internal Medicine

## 2015-09-05 ENCOUNTER — Ambulatory Visit (INDEPENDENT_AMBULATORY_CARE_PROVIDER_SITE_OTHER): Payer: Medicare Other | Admitting: Internal Medicine

## 2015-09-05 ENCOUNTER — Encounter (HOSPITAL_COMMUNITY): Payer: Medicare Other

## 2015-09-05 ENCOUNTER — Encounter (HOSPITAL_COMMUNITY)
Admission: RE | Admit: 2015-09-05 | Discharge: 2015-09-05 | Disposition: A | Discharge status: Home / Self Care | Payer: Medicare Other | Source: Ambulatory Visit | Attending: Cardiovascular Disease | Admitting: Cardiovascular Disease

## 2015-09-05 VITALS — BP 130/70 | HR 76 | Ht 71.0 in | Wt 181.2 lb

## 2015-09-05 DIAGNOSIS — Z48812 Encounter for surgical aftercare following surgery on the circulatory system: Secondary | ICD-10-CM | POA: Diagnosis not present

## 2015-09-05 DIAGNOSIS — I5022 Chronic systolic (congestive) heart failure: Secondary | ICD-10-CM | POA: Diagnosis not present

## 2015-09-05 DIAGNOSIS — I2510 Atherosclerotic heart disease of native coronary artery without angina pectoris: Secondary | ICD-10-CM

## 2015-09-05 DIAGNOSIS — I255 Ischemic cardiomyopathy: Secondary | ICD-10-CM

## 2015-09-05 DIAGNOSIS — Z4502 Encounter for adjustment and management of automatic implantable cardiac defibrillator: Secondary | ICD-10-CM | POA: Diagnosis not present

## 2015-09-05 LAB — CUP PACEART INCLINIC DEVICE CHECK
Battery Remaining Longevity: 10.3
Brady Statistic RA Percent Paced: 0 %
Brady Statistic RV Percent Paced: 91 %
Date Time Interrogation Session: 20161214135217
HIGH POWER IMPEDANCE MEASURED VALUE: 40.8503
HIGH POWER IMPEDANCE MEASURED VALUE: 41 Ohm
Implantable Lead Location: 753860
Implantable Lead Model: 7121
Lead Channel Pacing Threshold Amplitude: 1 V
Lead Channel Pacing Threshold Amplitude: 1 V
Lead Channel Pacing Threshold Amplitude: 3.25 V
Lead Channel Pacing Threshold Pulse Width: 0.6 ms
Lead Channel Pacing Threshold Pulse Width: 1 ms
Lead Channel Sensing Intrinsic Amplitude: 12 mV
Lead Channel Setting Sensing Sensitivity: 0.5 mV
MDC IDC LEAD IMPLANT DT: 20101028
MDC IDC LEAD IMPLANT DT: 20101028
MDC IDC LEAD LOCATION: 753858
MDC IDC LEAD MODEL: 4196
MDC IDC MSMT LEADCHNL LV IMPEDANCE VALUE: 400 Ohm
MDC IDC MSMT LEADCHNL LV PACING THRESHOLD AMPLITUDE: 3.25 V
MDC IDC MSMT LEADCHNL LV PACING THRESHOLD PULSEWIDTH: 1 ms
MDC IDC MSMT LEADCHNL RV IMPEDANCE VALUE: 375 Ohm
MDC IDC MSMT LEADCHNL RV PACING THRESHOLD PULSEWIDTH: 0.6 ms
MDC IDC PG SERIAL: 595859
MDC IDC SET LEADCHNL LV PACING AMPLITUDE: 5 V
MDC IDC SET LEADCHNL LV PACING PULSEWIDTH: 1 ms
MDC IDC SET LEADCHNL RV PACING AMPLITUDE: 2.5 V
MDC IDC SET LEADCHNL RV PACING PULSEWIDTH: 0.6 ms

## 2015-09-05 NOTE — Progress Notes (Signed)
Late entry, CBG 101 this AM. Sonia Side says he ate breakfast earlier than he normally does. Patient given lemonade and a banana.  Repeat CBG 100. Sonia Side will not exercise per protocol. Sonia Side plans to return to exercise on Friday.

## 2015-09-05 NOTE — Patient Instructions (Signed)

## 2015-09-05 NOTE — Progress Notes (Signed)
Patient Care Team: [name], MD as PCP - General (Internal Medicine) Diamantina Monks, RN as [place] Management   HPI  [name] [name]. is a 81 y.o. male seen in followup for congestive heart failure in the setting of atrial fibrillation with a rapid ventricular response and associated cardiomyopathy with both ischemic and nonischemic components.   He had  CRT-D implantation with Anticipation of AV junction ablation. However, we were then able to accomplish rate control And ablation was not undertaken. He subsequently developed heart failure again with a rapid ventricular rate and underwent AV junction ablation last fall.  2016>> Patient had progressive dyspnea in August in 2016 and ultimately underwent cardiac catheterization demonstrating severe proximal LAD stenosis. He underwent PCI with a drug-eluting stent at that time. He is treated with Plavix and warfarin.  Echo 2/16  EF 25-30  He has not had significant problems with chest pain. Breathing is better following his stenting. He does not have edema or nocturnal dyspnea.  Past Medical History  Diagnosis Date  . Chronic atrial fibrillation ([place])     a. Historically difficult rate control. b. s/p CRT-D implantation with anticipation of AV junction ablation but patient then was able to accomplish rate control and ablation not undertaken.; AVN ablation 08/2013 by Dr Lovena Le  . CARDIOMYOPATHY, PRIMARY, DILATED     a. Mixed ICM/NICM - EF 15% by echo 05/2009; St. Jude CRT-D implanted 06/2009. b. Echo 04/2013 - EF 20%, mild LVH.  [name] Chronic systolic CHF (congestive heart failure) ([place])   . CORONARY ARTERY DISEASE     a. BMS to LAD 04/2008. b. Cath 06/2009: nonobstructive disease. c. cath 05/16/2015 DES to prox LAD  . SLEEP APNEA   . Hypertension   . Hyperlipidemia   . Diabetes mellitus   . Osteoarthritis   . Tubular adenoma of colon   . Secondary hyperparathyroidism ([place])   . CKD (chronic kidney  disease), stage IV ([place])     a. Right brachiocephalic AV fistula [id number]. Neph = [place].  . Vertigo   . Valvular heart disease     a. Echo 04/2013: mild AI, mild MR.  . Pulmonary HTN ([place])     a. PA pressure 74mmHg by echo 05/01/13.  . Prostate cancer [facility])     a. s/p radical prostatectomy.  . Colon cancer ([place])   . Skin cancer   . Presence of permanent cardiac pacemaker     Past Surgical History  Procedure Laterality Date  . Colectomy    . Prostatectomy    . Penile prosthesis placement    . Removed prosthetic eye    . Ptca      stent placed  . Total knee arthroplasty  2008    right  . Pacemaker insertion  2010  . Av fistula placement  03/30/2012    Procedure: ARTERIOVENOUS (AV) FISTULA CREATION;  Surgeon: Angelia Mould, MD;  Location: [place];  Service: Vascular;  Laterality: Right;  Creation of BrachioCephalic Fistula Right arm  . Eye surgery      left eye prothesis  . Fracture surgery      collar bone, right knee replacement  . Bi-ventricular implantable cardioverter defibrillator  (crt-d)  2010    STJ CRTD implanted by Dr [name]  . Ablation  08/2013    AVN ablation by Dr Lovena Le  . Av node ablation N/A 09/12/2013    Procedure: AV NODE ABLATION;  Surgeon: [name], MD;  Location: [place] CATH LAB;  Service: Cardiovascular;  Laterality: N/A;  . Insert / replace / remove pacemaker    . Joint replacement    . Cardiac catheterization N/A 05/09/2015    Procedure: Left Heart Cath and Coronary Angiography;  Surgeon: Sherren Mocha, MD;  Location: [place] CV LAB;  Service: Cardiovascular;  Laterality: N/A;  . Cardiac catheterization N/A 05/16/2015    Procedure: Coronary Stent Intervention;  Surgeon: Sherren Mocha, MD;  Location: [place] CV LAB;  Service: Cardiovascular;  Laterality: N/A;    Current Outpatient Prescriptions  Medication Sig Dispense Refill  . acetaminophen (TYLENOL) 500 MG tablet Take 500 mg by mouth as needed for mild pain.     . calcitRIOL  (ROCALTROL) 0.25 MCG capsule Take 0.25 mcg by mouth. Monday, Wed and Friday    . calcium carbonate (TUMS EX) 750 MG chewable tablet Chew 1 tablet by mouth 3 (three) times daily with meals.    . clopidogrel (PLAVIX) 75 MG tablet Take 1 tablet (75 mg total) by mouth daily. 90 tablet 2  . DIGOX 125 MCG tablet Take 1 tablet by mouth  every other day 45 tablet 1  . furosemide (LASIX) 80 MG tablet Take 1 tablet two times daily.  May take one extra tablet daily for symptoms or 3 pound weight gain. 210 tablet 2  . HUMALOG MIX 75/25 (75-25) 100 UNIT/ML SUSP injection Inject subcutaneously 26  units two times daily with  meals 40 mL 1  . hydrALAZINE (APRESOLINE) 25 MG tablet Take 1 and 1/2 tablets by  mouth 3 times daily 315 tablet 0  . isosorbide mononitrate (IMDUR) 30 MG 24 hr tablet Take 1 tablet (30 mg total) by mouth daily. 30 tablet 3  . metoprolol succinate (TOPROL-XL) 100 MG 24 hr tablet Take 1 tablet (100 mg total) by mouth 2 (two) times daily. Take with or immediately following a meal. 60 tablet 0  . Multiple Vitamin (MULTIVITAMIN) tablet Take 1 tablet by mouth daily.    . potassium chloride SA (K-DUR,KLOR-CON) 20 MEQ tablet Take 2 tablets (40 mEq total) by mouth daily. 60 tablet 3  . warfarin (COUMADIN) 5 MG tablet Take 2.5 mg (one-half tablet) by mouth Mondays, and one whole tablet by mouth al other days (tue, Wed, Thur, Fri, Sat, Sun) 90 tablet 1   No current facility-administered medications for this visit.    Allergies  Allergen Reactions  . Ace Inhibitors Other (See Comments)    Stopped by Dr. Burt Knack due to progressive renal failure  . Prednisone Other (See Comments)    Raised blood sugar to almost 900    Review of Systems negative except from HPI and PMH  Physical Exam BP 130/70 mmHg  Pulse 76  Ht 5\' 11"  (1.803 m)  Wt 181 lb 3.2 oz (82.192 kg)  BMI 25.28 kg/m2 Well developed and nourished in no acute distress HENT normal Neck supple with JVP-flat Carotids brisk and full  without bruits Clear Irregularly irregular rate and rhythm with controlled entricular response, 2/6  Murmur  Abd-soft with active BS without hepatomegaly No Clubbing cyanosis edema Skin-warm and dry A & Oriented  Grossly normal sensory and motor function  ECG demonstrates atrial fibrillation with underlying ventricular pacing and PVCs; \ Pacing configuration is negative in lead 1  Assessment and  Plan  Complete heart block-iatrogenic status post AV junction ablation   Atrial fibrillation-permanent   Hypertension-improved  Coronary artery disease with prior stenting   Renal insufficiency grade 4  (Cr 4.2 or  so 9/16)  LV lead and increasing threshold   Implantable defibrillator-CRT-St. Jude      Blood pressure is improved.  His device is on advisory recall. He has an underlying rhythm; hence, we will wait for ERI to proceed.   Without symptoms of ischemia  Euvolemic continue current meds No changes

## 2015-09-06 ENCOUNTER — Encounter: Payer: Self-pay | Admitting: *Deleted

## 2015-09-06 ENCOUNTER — Ambulatory Visit (INDEPENDENT_AMBULATORY_CARE_PROVIDER_SITE_OTHER): Payer: Medicare Other | Admitting: General Practice

## 2015-09-06 ENCOUNTER — Other Ambulatory Visit: Payer: Self-pay | Admitting: *Deleted

## 2015-09-06 DIAGNOSIS — I482 Chronic atrial fibrillation, unspecified: Secondary | ICD-10-CM

## 2015-09-06 DIAGNOSIS — Z5181 Encounter for therapeutic drug level monitoring: Secondary | ICD-10-CM

## 2015-09-06 LAB — GLUCOSE, CAPILLARY
Glucose-Capillary: 101 mg/dL — ABNORMAL HIGH (ref 65–99)
Glucose-Capillary: 100 mg/dL — ABNORMAL HIGH (ref 65–99)

## 2015-09-06 LAB — POCT INR: INR: 3.4

## 2015-09-06 NOTE — Patient Outreach (Signed)
[facility]) Care Management   09/06/2015  Blue 1930/11/19 [id number]  [name] Heber. is an 82 y.o. male  Subjective: Patient states "I'm trying to find meaning in my life". "I am constantly searching to be functional and thirst knowledge of what to do and where to go". He states that he is fully aware that things will get worse and not better".  Objective:  BP 140/75 mmHg  Pulse 75  Resp 16  SpO2 98%  Review of Systems  Constitutional: Negative.   HENT: Positive for hearing loss.        Slight hearing loss - no need for hearing aide  Eyes: Positive for blurred vision.       Wears eyeglasses Has left prosthetic eye Cataract surgery to right eye  Respiratory: Negative for cough and sputum production.        Slight expiratory wheezing to left upper lobe otherwise clear lung sounds  Cardiovascular: Positive for leg swelling. Negative for chest pain.       Trace edema to lower extremities Has pacemaker  Gastrointestinal: Negative.   Genitourinary: Positive for frequency.  Musculoskeletal: Negative for falls.  Skin:       Basal cell carcinoma on forehead Dark age spots to face  Neurological:       History of vertigo  Endo/Heme/Allergies: Negative.   Psychiatric/Behavioral: Negative.     Physical Exam  Current Medications:   Current Outpatient Prescriptions  Medication Sig Dispense Refill  . acetaminophen (TYLENOL) 500 MG tablet Take 500 mg by mouth as needed for mild pain.     . calcitRIOL (ROCALTROL) 0.25 MCG capsule Take 0.25 mcg by mouth. Monday, Wed and Friday    . calcium carbonate (TUMS EX) 750 MG chewable tablet Chew 1 tablet by mouth 3 (three) times daily with meals.    . clopidogrel (PLAVIX) 75 MG tablet Take 1 tablet (75 mg total) by mouth daily. 90 tablet 2  . DIGOX 125 MCG tablet Take 1 tablet by mouth  every other day 45 tablet 1  . furosemide (LASIX) 80 MG tablet Take 1 tablet two times daily.  May take one extra tablet  daily for symptoms or 3 pound weight gain. 210 tablet 2  . HUMALOG MIX 75/25 (75-25) 100 UNIT/ML SUSP injection Inject subcutaneously 26  units two times daily with  meals 40 mL 1  . hydrALAZINE (APRESOLINE) 25 MG tablet Take 1 and 1/2 tablets by  mouth 3 times daily 315 tablet 0  . isosorbide mononitrate (IMDUR) 30 MG 24 hr tablet Take 1 tablet (30 mg total) by mouth daily. 30 tablet 3  . metoprolol succinate (TOPROL-XL) 100 MG 24 hr tablet Take 1 tablet (100 mg total) by mouth 2 (two) times daily. Take with or immediately following a meal. 60 tablet 0  . Multiple Vitamin (MULTIVITAMIN) tablet Take 1 tablet by mouth daily. Reported on 09/06/2015    . potassium chloride SA (K-DUR,KLOR-CON) 20 MEQ tablet Take 2 tablets (40 mEq total) by mouth daily. 60 tablet 3  . warfarin (COUMADIN) 5 MG tablet Take 2.5 mg (one-half tablet) by mouth Mondays, and one whole tablet by mouth al other days (tue, Wed, Thur, Fri, Sat, Sun) 90 tablet 1   No current facility-administered medications for this visit.    Functional Status:   In your present state of health, do you have any difficulty performing the following activities: 08/28/2015 06/04/2015  Hearing? N Y  Vision? Y N  Difficulty concentrating  or making decisions? N N  Walking or climbing stairs? N N  Dressing or bathing? N N  Doing errands, shopping? N N  Preparing Food and eating ? N -  Using the Toilet? N -  In the past six months, have you accidently leaked urine? N -  Do you have problems with loss of bowel control? N -  Managing your Medications? N -  Managing your Finances? N -  Housekeeping or managing your Housekeeping? N -    Fall/Depression Screening:    PHQ 2/9 Scores 08/23/2015 07/02/2015 06/11/2015 06/21/2014 11/03/2013 08/03/2012  PHQ - 2 Score 0 0 0 1 2 1     Assessment:   81 year old who is seen for heart failure management and education. Arrived at patient's home. He lives alone in a two-storey townhouse which he is maintaining by  himself after his wife died 1 1/2 years ago. Patient is still tearful when mentioning about his wife. He agreed to be contacted by our [facility] volunteer to talk to since he feels lonely especially this holiday season.  He is flying to [place] on Christmas day to spend some time with his son's family and be back on 10/11/15.  Patient is a member of [employer] and he expressed interest to join the [employer] (Physician Referred Exercise program) to transition from Cardiac Rehab since he is almost done with it and wants to maintain his stamina and strength. Notified Ms. D.Kinney Lexicographer) regarding patient's desire of the program. Patient monitors weight daily and was provided [facility] calendar to record it. He states that his kidney doctor (Dr. Lorrene Reid) told him that his base weight is 174 pounds to monitor for weight gains. He reports that provider ordered him to take an additional tablet of Lasix if weight is up 3 pounds. [facility] heart failure packet was provided and discussed with him. Encouraged patient to read food labels to keep him within his low salt restriction. Educated patient on heart failure zones and encourage to evaluate self using the different zones and know when to call the doctor. Patient reports no oxygen use.   Patient expressed no concerns managing his diabetes. He reports giving self Humalog 24 units twice a day. Last A1c was 6.5. He reports that his blood sugar is check 3 times a week when attending Cardiac Rehab. Primary care provider manages his diabetes per patient.  Patient reports that his blood pressure is managed well with his medications. Blood pressure readings range from 135/70- 145/80. Blood pressure is also checked at Cardiac Rehab as stated and he does his routine exercises. Patient reports no fall episodes and ambulates without assistive device.   He was seen by Dr. Bing Plume (eye) on 12/6 with no signs of diabetic problem to right eye. He wears left eye prosthesis since he lost his  vision on the left when he was young. He saw Dr. Lorrene Reid (kidney) on 12/13 with pending laboratory results. Dr [name] (cardiology) saw him and tested his pacemaker on 12/14. He was seen today by Dr. Allyson Sabal (derma) and froze a lesion on his forehead. He has an appointment with primary care provider tomorrow 12/16. Patient drives himself to his appointments or daughter can provide transportation according to patient.  Patient expressed no other needs or concerns at this time. He agreed to home visit next month after coming back from [place]. Encouraged patient to call [facility], care management coordinator or 24-hour nurse line if necessary. Contact informations provided.   Plan: Routine home visit on  10/12/15. Referral to Ms. Mercy Riding- PREP (Physician Referred Exercise Program) coordinator Refer to [facility] volunteer Butch Penny) for socialization.  [facility] CM Care Plan Problem One        Most Recent Value   Care Plan Problem One  lack of knowledge about heart failure management   Role Documenting the Problem One  Care Management Coordinator   Care Plan for Problem One  Active   [facility]N Long Term Goal (31-90 days)  patient will verbalize at least 3 ways to live life better with heart failure in the next 31 days   [facility]N Long Term Goal Start Date  09/06/15   Interventions for Problem One Long Term Goal  provide [facility] heart failure packet, and discussed,  educated to the booklet regarding living better with heart failure and encouraged to read and ask questions if any,  encourage adherence to medications,  encourage to continue attending providers' follow-up appointments,  referred to [place] program  to transition after cardiac rehab exercises,  encourage to evaluate daily status using heart failure zones,  [facility] calendar provided to record daily weight monitoring,  advise to read food labels and monitor salt intake    THN CM Short Term Goal #1 (0-30 days)  patient will identify the 3 heart failure zones in the next 30 days    THN CM Short Term Goal #1 Start Date  09/06/15   Interventions for Short Term Goal #1  provide [facility] packet and explain the different heart failure zones,  encourage patient to use heart failure zones to evaluate how he feels and his condition daily- call doctor for help if needed,  provide heart failure magnet to place on refrigerator door for easy visualization and review   THN CM Short Term Goal #2 (0-30 days)  patient will identify at least 4 signs and symptoms of heart failure exacerbation and when to call the doctor in the next 30 days   THN CM Short Term Goal #2 Start Date  09/06/15   Interventions for Short Term Goal #2  provide and discuss [facility] heart failure packet,  educate on the signs and symptoms of heart failure exacerbation like weight gain of 3 pounds overnight and 5 pounds in a week,  dry hacky cough,  swelling to feet/ankles/legs/stomach, chest discomfort, shortness of breath,increased tiredness and less energy than usual, encourage to call the doctor for help     [facility] CM Care Plan Problem Two        Most Recent Value   Care Plan Problem Two  Fear of inability to maintain strength, balance/ stamina and decrease in socialization    Role Documenting the Problem Two  Care Management Coordinator   Care Plan for Problem Two  Active   THN CM Short Term Goal #1 (0-30 days)  patient will be able to transition to [facility] PREP (Physician Referred Exercise Program) in the next 30 days   THN CM Short Term Goal #1 Start Date  09/06/15   Interventions for Short Term Goal #2   discuss other voluntary activities/ services he can do after finishing up with cardiac rehab,  refer to [facility] PREP program,  follow-up attendance with PREP program,  encourage to continue walking his pet (dog), walking to the mailbox as form of exercises and perform routine exercises provided in cardiac rehab   THN CM Short Term Goal #2 (0-30 days)  patient will get in contact with [facility] volunteer for conversation/ socialization in the  next 30 days  THN CM Short Term Goal #2 Start Date  09/06/15   Interventions for Short Term Goal #2  explain to patient about [facility] volunteer service to provide socialization with patients who needs it,  refer patient to [facility] volunteer Butch Penny) to spend time  communicating (topics of interest) or providing socialization with patient to ease up his feeling alone/ loneliness     Kanesha Cadle A. Saleena Tamas, BSN, RN-BC [place] Management Coordinator Cell: [phone]

## 2015-09-06 NOTE — Progress Notes (Signed)
Pre visit review using our clinic review tool, if applicable. No additional management support is needed unless otherwise documented below in the visit note. 

## 2015-09-07 ENCOUNTER — Ambulatory Visit: Payer: Medicare Other | Admitting: Internal Medicine

## 2015-09-07 ENCOUNTER — Encounter (HOSPITAL_COMMUNITY)
Admission: RE | Admit: 2015-09-07 | Discharge: 2015-09-07 | Disposition: A | Discharge status: Home / Self Care | Payer: Medicare Other | Source: Ambulatory Visit | Attending: Cardiovascular Disease | Admitting: Cardiovascular Disease

## 2015-09-07 ENCOUNTER — Encounter: Payer: Self-pay | Admitting: *Deleted

## 2015-09-07 ENCOUNTER — Encounter (HOSPITAL_COMMUNITY): Payer: Medicare Other

## 2015-09-07 DIAGNOSIS — Z0289 Encounter for other administrative examinations: Secondary | ICD-10-CM

## 2015-09-07 DIAGNOSIS — Z48812 Encounter for surgical aftercare following surgery on the circulatory system: Secondary | ICD-10-CM | POA: Diagnosis not present

## 2015-09-07 LAB — GLUCOSE, CAPILLARY
GLUCOSE-CAPILLARY: 134 mg/dL — AB (ref 65–99)
Glucose-Capillary: 158 mg/dL — ABNORMAL HIGH (ref 65–99)

## 2015-09-07 NOTE — Progress Notes (Addendum)
[name] [name] will graduate from cardiac rehab program today with completion of 36 exercise sessions in Phase II on Monday. Pt maintained good attendance and progressed nicely during his participation in rehab as evidenced by increased MET level.   Medication list reconciled. Repeat  PHQ score- 0 .  Pt has made significant lifestyle changes and should be commended for his success. Pt feels he has achieved his goals during cardiac rehab.   Pt plans to continue exercise at the [facility].

## 2015-09-10 ENCOUNTER — Encounter (HOSPITAL_COMMUNITY): Payer: Medicare Other

## 2015-09-10 ENCOUNTER — Encounter (HOSPITAL_COMMUNITY)
Admission: RE | Admit: 2015-09-10 | Discharge: 2015-09-10 | Disposition: A | Discharge status: Home / Self Care | Payer: Medicare Other | Source: Ambulatory Visit | Attending: Cardiovascular Disease | Admitting: Cardiovascular Disease

## 2015-09-10 DIAGNOSIS — Z48812 Encounter for surgical aftercare following surgery on the circulatory system: Secondary | ICD-10-CM | POA: Diagnosis not present

## 2015-09-10 LAB — GLUCOSE, CAPILLARY
GLUCOSE-CAPILLARY: 229 mg/dL — AB (ref 65–99)
GLUCOSE-CAPILLARY: 126 mg/dL — AB (ref 65–99)

## 2015-09-11 ENCOUNTER — Encounter: Payer: Self-pay | Admitting: Podiatry

## 2015-09-11 ENCOUNTER — Ambulatory Visit (INDEPENDENT_AMBULATORY_CARE_PROVIDER_SITE_OTHER): Payer: Medicare Other | Admitting: Podiatry

## 2015-09-11 ENCOUNTER — Other Ambulatory Visit: Payer: Self-pay | Admitting: Internal Medicine

## 2015-09-11 VITALS — BP 167/72 | HR 75

## 2015-09-11 DIAGNOSIS — M79606 Pain in leg, unspecified: Secondary | ICD-10-CM | POA: Diagnosis not present

## 2015-09-11 DIAGNOSIS — B351 Tinea unguium: Secondary | ICD-10-CM

## 2015-09-11 NOTE — Patient Instructions (Signed)
Seen for hypertrophic nails. All nails debrided. Return in 3 months or as needed.  

## 2015-09-11 NOTE — Progress Notes (Signed)
Subjective:  81 y.o. year old male IDDM presents requesting toe nails trimmed today.   Objective:  Dermatologic:  Thick dystrophic nails x 10. Skin: No skin lesions.  Vascular:  Dorsalis pedis pulses palpable on both feet.  Posterior tibial pulses are not palpable on both feet.  No edema or erythema, no ischemic changes noted.  Orthopedic:  Cavus type foot without gross deformity.  Neurologic:  Failed respond to Monofilament sensory testing bilateral. Vibratory and Ankle DTR is within normal bilateral.   Assessment:  Dystrophic mycotic nails x 9.  Diabetic neuropathy.   Treatment: All mycotic nails and ingrown nails debrided.  Return in 3 months or as needed

## 2015-09-12 ENCOUNTER — Encounter (HOSPITAL_COMMUNITY): Payer: Medicare Other

## 2015-09-14 ENCOUNTER — Encounter (HOSPITAL_COMMUNITY): Payer: Medicare Other

## 2015-09-25 ENCOUNTER — Other Ambulatory Visit: Payer: Self-pay | Admitting: Internal Medicine

## 2015-10-01 ENCOUNTER — Other Ambulatory Visit: Payer: Self-pay | Admitting: Internal Medicine

## 2015-10-08 ENCOUNTER — Ambulatory Visit (INDEPENDENT_AMBULATORY_CARE_PROVIDER_SITE_OTHER): Payer: Medicare Other | Admitting: *Deleted

## 2015-10-08 DIAGNOSIS — Z95810 Presence of automatic (implantable) cardiac defibrillator: Secondary | ICD-10-CM

## 2015-10-09 NOTE — Progress Notes (Signed)
Remote ICD transmission.   

## 2015-10-12 ENCOUNTER — Encounter: Payer: Self-pay | Admitting: Cardiology

## 2015-10-12 ENCOUNTER — Encounter: Payer: Self-pay | Admitting: *Deleted

## 2015-10-12 ENCOUNTER — Other Ambulatory Visit: Payer: Self-pay | Admitting: *Deleted

## 2015-10-12 LAB — CUP PACEART REMOTE DEVICE CHECK
Battery Remaining Longevity: 9 mo
Battery Remaining Percentage: 22 %
Battery Voltage: 2.78 V
Date Time Interrogation Session: 20170116090312
Implantable Lead Implant Date: 20101028
Implantable Lead Location: 753860
Lead Channel Setting Pacing Pulse Width: 0.6 ms
MDC IDC LEAD IMPLANT DT: 20101028
MDC IDC LEAD LOCATION: 753858
MDC IDC LEAD MODEL: 4196
MDC IDC LEAD MODEL: 7121
MDC IDC PG SERIAL: 595859
MDC IDC SET LEADCHNL LV PACING AMPLITUDE: 5 V
MDC IDC SET LEADCHNL LV PACING PULSEWIDTH: 1 ms
MDC IDC SET LEADCHNL RV PACING AMPLITUDE: 2.5 V
MDC IDC SET LEADCHNL RV SENSING SENSITIVITY: 0.5 mV

## 2015-10-12 NOTE — Patient Outreach (Signed)
[facility]) Care Management   10/12/2015  [place] 12-Oct-1930 [id number]  [name] [name]. is an 82 y.o. male  Subjective: Patient reports he just arrived from [place] couple of days ago where he spent the holidays with family. He mentioned that he was finally able to celebrate Christmas with his son after 30 years. He states "feeling good" and enjoyed his vacation.  Objective:  BP 138/70 mmHg  Pulse 76  Resp 20  SpO2 97%  Review of Systems  Constitutional: Negative.   HENT: Positive for hearing loss.        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 Negative for sputum production and wheezing.        Slightly diminished lung sounds, otherwise clear No wheezing Uses CPAP at nights  Cardiovascular: Positive for leg swelling.       Regular rate and rhythm Trace edema noted more to left ankle Has permanent pacemaker  Gastrointestinal: Negative.        Bowel sounds present Abdomen soft and non-tender  Genitourinary: Positive for frequency.  Musculoskeletal: Negative for falls.  Skin: Negative.        Dark age spots to face  Neurological: Negative.   Endo/Heme/Allergies: Negative.   Psychiatric/Behavioral: Negative.  The patient is not nervous/anxious.     Physical Exam  Current Medications:   Current Outpatient Prescriptions  Medication Sig Dispense Refill  . acetaminophen (TYLENOL) 500 MG tablet Take 500 mg by mouth as needed for mild pain.     . calcitRIOL (ROCALTROL) 0.25 MCG capsule Take 0.25 mcg by mouth. Monday, Wed and Friday    . calcium carbonate (TUMS EX) 750 MG chewable tablet Chew 1 tablet by mouth 3 (three) times daily with meals.    . clopidogrel (PLAVIX) 75 MG tablet Take 1 tablet (75 mg total) by mouth daily. 90 tablet 2  . DIGOX 125 MCG tablet Take 1 tablet by mouth  every other day 45 tablet 1  . furosemide (LASIX) 80 MG  tablet Take 1 tablet two times daily.  May take one extra tablet daily for symptoms or 3 pound weight gain. 210 tablet 2  . HUMALOG MIX 75/25 (75-25) 100 UNIT/ML SUSP injection Inject subcutaneously 26  units two times daily with  meals 40 mL 1  . hydrALAZINE (APRESOLINE) 25 MG tablet Take 1 and 1/2 tablets by  mouth 3 times daily 315 tablet 1  . isosorbide mononitrate (IMDUR) 30 MG 24 hr tablet Take 1 tablet (30 mg total) by mouth daily. 30 tablet 3  . metoprolol succinate (TOPROL-XL) 100 MG 24 hr tablet Take 1 tablet by mouth  twice a day, with or  immediately following   meals 180 tablet 0  . Multiple Vitamin (MULTIVITAMIN) tablet Take 1 tablet by mouth daily. Reported on 09/06/2015    . potassium chloride SA (K-DUR,KLOR-CON) 20 MEQ tablet Take 2 tablets (40 mEq total) by mouth daily. 60 tablet 3  . warfarin (COUMADIN) 5 MG tablet Take 2.5 mg (one-half tablet) by mouth Mondays, and one whole tablet by mouth al other days (tue, Wed, Thur, Fri, Sat, Sun) 90 tablet 1  . potassium chloride SA (K-DUR,KLOR-CON) 20 MEQ tablet Take 1 tablet by mouth two  times daily (Patient not taking: Reported on 10/12/2015) 180 tablet 1   No current facility-administered medications for this visit.    Functional  Status:   In your present state of health, do you have any difficulty performing the following activities: 10/12/2015 08/28/2015  Hearing? N N  Vision? Y Y  Difficulty concentrating or making decisions? N N  Walking or climbing stairs? N N  Dressing or bathing? N N  Doing errands, shopping? N N  Preparing Food and eating ? N N  Using the Toilet? N N  In the past six months, have you accidently leaked urine? N N  Do you have problems with loss of bowel control? N N  Managing your Medications? N N  Managing your Finances? N N  Housekeeping or managing your Housekeeping? N N    Fall/Depression Screening:    PHQ 2/9 Scores 10/12/2015 09/07/2015 08/23/2015 07/02/2015 06/11/2015 06/21/2014 11/03/2013  PHQ - 2  Score 0 0 0 0 0 1 2    Assessment:   Arrived at patient's home. Patient is by himself but states his son and his family stayed the night with him for his grandson's birthday celebration at his house.  Patient had a bright affect relating about his adventure/ trip to [place] for almost a month.  He reports feeling good, on the "green zone"- seldomly coughs, no mucus, no chest discomfort or congestion and no increased swelling.  He states maintaining his weight range at 173- 176 pounds.  Patient reports that he was monitoring his breathing, coughing, swelling, and watching his diet while on vacation.  Patient mentioned that he was not able to connect with the [facility] exercise program as well as the [facility] volunteer since his cardiac rehab finished later than expected, got busy with the holidays and preparing for his trip. He states he is still interested with the [facility] exercise program and will get in touch with the [facility] coordinator one of these days. He reports that he walked at least 1-2 miles daily for his exercise during his vacation.  According to patient, he is presently on close follow-up by cardiology every month because he is on battery watch for his defibrillator. He was checked on 10/08/15 and next follow-up will be 2/17. He is very aware of what to watch for in regards to this.  He also has a follow-up appointment with renal doctor (Dr. Lorrene Reid) on 2/9. Patient still drives to his providers' appointments or his stepdaughter does if needed.  Patient denies any problem or need with his diabetes. He reports managing it well with blood sugar monitoring, insulin self-administration and diet control as what he learned from cardiac rehab sessions. No reported episodes of hypoglycemia or hyperglycemia thus far. His last A1c was 6.8. Patient was educated on maintaining or improving it with diet, exercise and medication adherence as well as need to have it rechecked regularly to keep on the right track.   Patient is very self sufficient with care and health maintenance. He likes to stay active to maintain strength and stamina. He makes sure to eat more vegetables and fruits as well as having adequate sleep and rest. Patient was also started on multivitamins (Rena-vite) per nephrologist. Encouraged patient to participate with church and community activities to keep him going. Patient agreed.  He denies of any other needs or concerns at this time. Encouraged patient to call [facility], care management coordinator or 24-hour nurse line if needed. Patient has contact informations.   Plan: Routine home visit 11/12/15. Care coordination with [facility] coordinator for [facility] exercise program [facility]) for patient engagement.   [facility] CM Care Plan Problem One  Most Recent Value   Care Plan Problem One  lack of knowledge about heart failure management   Role Documenting the Problem One  Care Management Coordinator   Care Plan for Problem One  Active   THN Long Term Goal (31-90 days)  patient will verbalize at least 3 ways to live life better with heart failure in the next 31 days   THN Long Term Goal Start Date  09/06/15   Interventions for Problem One Long Term Goal  provide [facility] heart failure packet, and discussed,  educated to the booklet regarding living better with heart failure and encouraged to read and ask questions if any,  encourage adherence to medications,  encourage to continue attending providers' follow-up appointments,  referred to [place] program  to transition after cardiac rehab exercises,  encourage to evaluate daily status using heart failure zones,  [facility] calendar provided to record daily weight monitoring,  advise to read food labels and monitor salt intake    THN CM Short Term Goal #1 (0-30 days)  patient will identify the 3 heart failure zones in the next 30 days   THN CM Short Term Goal #1 Start Date  09/06/15   [facility] CM Short Term Goal #1 Met Date  10/12/15   THN CM Short Term Goal #2 (0-30  days)  patient will identify at least 4 signs and symptoms of heart failure exacerbation and when to call the doctor in the next 30 days   THN CM Short Term Goal #2 Start Date  09/06/15   [facility] CM Short Term Goal #2 Met Date  10/12/15 [Goal met- needs reinforcement]   THN CM Short Term Goal #3 (0-30 days)  patient will continue to weigh daily and report weight gain of 3 pounds overnight and 5 pounds in a week in the next 30 days   THN CM Short Term Goal #3 Start Date  10/12/15   Interventions for Short Tern Goal #3  encourage patient to continue monitoring weight everyday and record,  remind to take weight in the morning after using the bathroom,  encourage patient to notify provider if he took extra Lasix as ordered for a 3 pound weight gain,  encourage to monitor for swelling to feet,legs,hands and report      [facility] CM Care Plan Problem Two        Most Recent Value   Care Plan Problem Two  Fear of inability to maintain strength, balance/ stamina and decrease in socialization    Role Documenting the Problem Two  Care Management Coordinator   Care Plan for Problem Two  Active   THN CM Short Term Goal #1 (0-30 days)  patient will be able to transition to [facility] PREP (Physician Referred Exercise Program) in the next 30 days   THN CM Short Term Goal #1 Start Date  10/12/15 Billy Fischer goal 1/20-no time around the holidays & trip to [place]]   Interventions for Short Term Goal #2   encourage voluntary activities/ services he can do since he is finish with cardiac rehab,  directed patient to connect with [facility] coordinator for [place] program since he is back from his long trip (contact information with patient),  follow-up attendance with PREP program,  encourage to continue walking his pet (dog), walking to the mailbox as form of exercises and perform recommended exercises provided in cardiac rehab   THN CM Short Term Goal #2 (0-30 days)  patient will get in contact with [facility] volunteer for conversation/ socialization  in the next 30 days    THN CM Short Term Goal #2 Start Date  09/06/15   [facility] CM Short Term Goal #2 Met Date  10/12/15 [goal not met-no time around the holidays& trip to [place]   Interventions for Short Term Goal #2  explain to patient about [facility] volunteer service to provide socialization with patients who needs it,  refer patient to [facility] volunteer Butch Penny) to spend time  communicating (topics of interest) or providing socialization with patient to ease up his feeling alone/ loneliness   THN CM Short Term Goal #3 (0-30 days)  patient will report attendance to social activities in church or community in the next 30 days   [facility] CM Short Term Goal #3 Start Date  10/12/15   Interventions for Short Term Goal #3  encourage patient to connect with his church for social activities that he likes such as: Company secretary, dinner socials, senior citizen luncheons,  encourage patient to listen out or watch for community affairs or gatherings he can participate in,  remind patient to contact and connect with [facility] for [employer] exercise program and get involved        Luther. Anwar Crill, BSN, RN-BC [place] Management Coordinator Cell: [phone]

## 2015-10-13 ENCOUNTER — Encounter: Payer: Self-pay | Admitting: *Deleted

## 2015-10-15 ENCOUNTER — Ambulatory Visit (INDEPENDENT_AMBULATORY_CARE_PROVIDER_SITE_OTHER): Payer: Medicare Other | Admitting: General Practice

## 2015-10-15 DIAGNOSIS — Z5181 Encounter for therapeutic drug level monitoring: Secondary | ICD-10-CM

## 2015-10-15 DIAGNOSIS — I482 Chronic atrial fibrillation, unspecified: Secondary | ICD-10-CM

## 2015-10-15 LAB — POCT INR: INR: 2.5

## 2015-10-15 NOTE — Progress Notes (Signed)
Pre visit review using our clinic review tool, if applicable. No additional management support is needed unless otherwise documented below in the visit note. 

## 2015-10-19 ENCOUNTER — Telehealth: Payer: Self-pay | Admitting: Internal Medicine

## 2015-10-19 ENCOUNTER — Encounter: Payer: Self-pay | Admitting: Family Medicine

## 2015-10-19 ENCOUNTER — Ambulatory Visit (INDEPENDENT_AMBULATORY_CARE_PROVIDER_SITE_OTHER): Payer: Medicare Other | Admitting: Family Medicine

## 2015-10-19 VITALS — BP 132/78 | HR 75 | Temp 97.5°F | Ht 71.0 in | Wt 178.3 lb

## 2015-10-19 DIAGNOSIS — J209 Acute bronchitis, unspecified: Secondary | ICD-10-CM

## 2015-10-19 NOTE — Telephone Encounter (Signed)
Nurse Assessment  Nurse: Orvan Seen, RN, Jacquilin Date/Time (Eastern Time): 10/19/2015 8:31:10 AM  Confirm and document reason for call. If symptomatic, describe symptoms. You must click the next button to save text entered. ---Caller states he's having cold symptoms for a week. He's in heart failure, having chest discomfort (constant)- Started Tuesday evening.  Has the patient traveled out of the country within the last 30 days? ---Not Applicable  Does the patient have any new or worsening symptoms? ---Yes  Will a triage be completed? ---Yes  Related visit to physician within the last 2 weeks? ---No  Does the PT have any chronic conditions? (i.e. diabetes, asthma, etc.) ---Yes  List chronic conditions. ---a-fib, CHF, diabetes, osteoarthritis, HTN  Is this a behavioral health or substance abuse call? ---No     Guidelines    Guideline Title Affirmed Question Affirmed Notes  Chest Pain Difficulty breathing    Final Disposition User   Go to ED Now Orvan Seen, RN, Jacquilin    Comments  Caller stated "I don't have a way to the ER" Advised caller he could call EMS- Caller stated that "I don't feel like i need to do that, I am just trying to get in with a doctor at the office". Advised caller that when chest pain/ tightness that is constant we cannot rule out that its not his heart vs the cough. Caller states he understands but wants to be seen at the office. Called the office ([facility]) and advised them of patient refusal. I went ahead and schedule him a appointment with Dr. Elease Hashimoto at [phone]. His PCP was not in the office today per the schedule.   Referrals  GO TO FACILITY REFUSED   Disagree/Comply: Disagree  Disagree/Comply Reason: Disagree with instructions

## 2015-10-19 NOTE — Progress Notes (Signed)
Subjective:    Patient ID: [name] Brenner., male    DOB: 19-Jan-1931, 82 y.o.   MRN: [id number]  HPI Acute visit for 2-3 day history of cough. Cough has been nonproductive. He describes a "tickle" back of his throat which seems to be associate with his cough. Denies any fever or chills. Minimal nasal congestion. He has multiple chronic problems including history of CHF, atrial fibrillation, hypertension, obstructive sleep apnea, type 2 diabetes. Denies recent orthopnea. Weight has been stable. Is using his CPAP regularly. He's had some significant nasal congestion especially at night.  Past Medical History  Diagnosis Date  . Chronic atrial fibrillation ([place])     a. Historically difficult rate control. b. s/p CRT-D implantation with anticipation of AV junction ablation but patient then was able to accomplish rate control and ablation not undertaken.; AVN ablation 08/2013 by Dr Lovena Le  . CARDIOMYOPATHY, PRIMARY, DILATED     a. Mixed ICM/NICM - EF 15% by echo 05/2009; St. Jude CRT-D implanted 06/2009. b. Echo 04/2013 - EF 20%, mild LVH.  [name] Chronic systolic CHF (congestive heart failure) ([place])   . CORONARY ARTERY DISEASE     a. BMS to LAD 04/2008. b. Cath 06/2009: nonobstructive disease. c. cath 05/16/2015 DES to prox LAD  . SLEEP APNEA   . Hypertension   . Hyperlipidemia   . Diabetes mellitus   . Osteoarthritis   . Tubular adenoma of colon   . Secondary hyperparathyroidism ([place])   . CKD (chronic kidney disease), stage IV ([place])     a. Right brachiocephalic AV fistula [id number]. Neph = [place].  . Vertigo   . Valvular heart disease     a. Echo 04/2013: mild AI, mild MR.  . Pulmonary HTN ([place])     a. PA pressure 12mmHg by echo 05/01/13.  . Prostate cancer [facility])     a. s/p radical prostatectomy.  . Colon cancer ([place])   . Skin cancer   . Presence of permanent cardiac pacemaker    Past Surgical History  Procedure Laterality Date  . Colectomy    . Prostatectomy    . Penile prosthesis  placement    . Removed prosthetic eye    . Ptca      stent placed  . Total knee arthroplasty  2008    right  . Pacemaker insertion  2010  . Av fistula placement  03/30/2012    Procedure: ARTERIOVENOUS (AV) FISTULA CREATION;  Surgeon: Angelia Mould, MD;  Location: [place];  Service: Vascular;  Laterality: Right;  Creation of BrachioCephalic Fistula Right arm  . Eye surgery      left eye prothesis  . Fracture surgery      collar bone, right knee replacement  . Bi-ventricular implantable cardioverter defibrillator  (crt-d)  2010    STJ CRTD implanted by Dr [name]  . Ablation  08/2013    AVN ablation by Dr Lovena Le  . Av node ablation N/A 09/12/2013    Procedure: AV NODE ABLATION;  Surgeon: [name], MD;  Location: [facility] CATH LAB;  Service: Cardiovascular;  Laterality: N/A;  . Insert / replace / remove pacemaker    . Joint replacement    . Cardiac catheterization N/A 05/09/2015    Procedure: Left Heart Cath and Coronary Angiography;  Surgeon: Sherren Mocha, MD;  Location: [facility] CV LAB;  Service: Cardiovascular;  Laterality: N/A;  . Cardiac catheterization N/A 05/16/2015    Procedure: Coronary Stent Intervention;  Surgeon: Sherren Mocha, MD;  Location:  [facility] INVASIVE CV LAB;  Service: Cardiovascular;  Laterality: N/A;    reports that he quit smoking about 49 years ago. His smoking use included Cigarettes. He has never used smokeless tobacco. He reports that he drinks alcohol. He reports that he does not use illicit drugs. family history includes Heart disease in his father and mother; Hypertension in his mother; Throat cancer in his father and mother. Allergies  Allergen Reactions  . Ace Inhibitors Other (See Comments)    Stopped by Dr. Burt Knack due to progressive renal failure  . Prednisone Other (See Comments)    Raised blood sugar to almost 900      Review of Systems  Constitutional: Negative for fever and chills.  HENT: Positive for congestion. Negative for sore throat.     Respiratory: Positive for cough. Negative for shortness of breath and wheezing.   Cardiovascular: Negative for chest pain, palpitations and leg swelling.       Objective:   Physical Exam  Constitutional: He appears well-developed and well-nourished.  Neck: Neck supple. No JVD present.  Cardiovascular: Normal rate.   Pulmonary/Chest: Effort normal and breath sounds normal. No respiratory distress. He has no wheezes. He has no rales.  Musculoskeletal: He exhibits no edema.          Assessment & Plan:  Cough. Nonfocal exam. Suspect acute viral bronchitis. He does not have any rales, fever, or other suggestion of pneumonia at this point. Pulse oximetry 95%. Doubt heart failure related. Monitor weight closely. He will try over-the-counter Mucinex. Follow-up immediately for any shortness of breath or fever

## 2015-10-19 NOTE — Progress Notes (Signed)
Pre visit review using our clinic review tool, if applicable. No additional management support is needed unless otherwise documented below in the visit note. 

## 2015-10-19 NOTE — Patient Instructions (Signed)
Acute Bronchitis Bronchitis is inflammation of the airways that extend from the windpipe into the lungs (bronchi). The inflammation often causes mucus to develop. This leads to a cough, which is the most common symptom of bronchitis.  In acute bronchitis, the condition usually develops suddenly and goes away over time, usually in a couple weeks. Smoking, allergies, and asthma can make bronchitis worse. Repeated episodes of bronchitis may cause further lung problems.  CAUSES Acute bronchitis is most often caused by the same virus that causes a cold. The virus can spread from person to person (contagious) through coughing, sneezing, and touching contaminated objects. SIGNS AND SYMPTOMS   Cough.   Fever.   Coughing up mucus.   Body aches.   Chest congestion.   Chills.   Shortness of breath.   Sore throat.  DIAGNOSIS  Acute bronchitis is usually diagnosed through a physical exam. Your health care provider will also ask you questions about your medical history. Tests, such as chest X-rays, are sometimes done to rule out other conditions.  TREATMENT  Acute bronchitis usually goes away in a couple weeks. Oftentimes, no medical treatment is necessary. Medicines are sometimes given for relief of fever or cough. Antibiotic medicines are usually not needed but may be prescribed in certain situations. In some cases, an inhaler may be recommended to help reduce shortness of breath and control the cough. A cool mist vaporizer may also be used to help thin bronchial secretions and make it easier to clear the chest.  HOME CARE INSTRUCTIONS  Get plenty of rest.   Drink enough fluids to keep your urine clear or pale yellow (unless you have a medical condition that requires fluid restriction). Increasing fluids may help thin your respiratory secretions (sputum) and reduce chest congestion, and it will prevent dehydration.   Take medicines only as directed by your health care provider.  If  you were prescribed an antibiotic medicine, finish it all even if you start to feel better.  Avoid smoking and secondhand smoke. Exposure to cigarette smoke or irritating chemicals will make bronchitis worse. If you are a smoker, consider using nicotine gum or skin patches to help control withdrawal symptoms. Quitting smoking will help your lungs heal faster.   Reduce the chances of another bout of acute bronchitis by washing your hands frequently, avoiding people with cold symptoms, and trying not to touch your hands to your mouth, nose, or eyes.   Keep all follow-up visits as directed by your health care provider.  SEEK MEDICAL CARE IF: Your symptoms do not improve after 1 week of treatment.  SEEK IMMEDIATE MEDICAL CARE IF:  You develop an increased fever or chills.   You have chest pain.   You have severe shortness of breath.  You have bloody sputum.   You develop dehydration.  You faint or repeatedly feel like you are going to pass out.  You develop repeated vomiting.  You develop a severe headache. MAKE SURE YOU:   Understand these instructions.  Will watch your condition.  Will get help right away if you are not doing well or get worse.   This information is not intended to replace advice given to you by your health care provider. Make sure you discuss any questions you have with your health care provider.   Document Released: 10/16/2004 Document Revised: 09/29/2014 Document Reviewed: 03/01/2013 Elsevier Interactive Patient Education 2016 [place] for nasal congestion.

## 2015-10-22 ENCOUNTER — Encounter: Payer: Self-pay | Admitting: Gastroenterology

## 2015-10-25 ENCOUNTER — Encounter: Payer: Self-pay | Admitting: Cardiology

## 2015-11-08 NOTE — Progress Notes (Signed)
Cardiology Office Note:    Date:  11/09/2015   ID:  [name] Brenner., DOB Apr 14, 1931, MRN [id number]  PCP:  Nyoka Cowden, MD  Cardiologist:  Dr. Sherren Mocha   Electrophysiologist:  Dr. Virl Axe  Nephrologist:  Dr. Lorrene Reid   Chief Complaint  Patient presents with  . Coronary Artery Disease    follow up  . Congestive Heart Failure    follow up    History of Present Illness:     [name] [name]. is a 82 y.o. male with a hx of CAD status post stenting to the LAD in 2009, dilated cardiomyopathy, chronic systolic HF, atrial fibrillation, stage IV CKD, status post AV node ablation 12/14, status post CRT-D, sleep apnea on CPAP, pulmonary hypertension. Cardiomyopathy is felt to be out of proportion to CAD (NIC[facility]).  Admitted 8/16 with dyspnea and malaise felt to be his anginal equivalent. He was followed by nephrology. Cardiac catheterization demonstrated severe proximal LAD stenosis, severe diagonal branch stenosis in a small vessel and nonobstructive LCx and RCA stenoses. Given his chronic kidney disease he was brought back for staged intervention and underwent successful PCI of the proximal LAD with a DES and scoring balloon angioplasty.   Readmitted [id number] with a/c systolic CHF. He had minimally elevated troponins. This was felt to be related to demand ischemia in the setting of chronic kidney disease. Long-acting nitrates were adjusted.   Last seen by Dr. Burt Knack 11/16. Plan was for patient to continue Plavix until follow-up today with consideration for switching Plavix to aspirin. He returns for follow-up.  He is here by himself today. He has been doing well since last seen. He denies recurrent chest discomfort or shortness of breath. He denies orthopnea, PND or edema. Denies syncope.   Past Medical History  Diagnosis Date  . Chronic atrial fibrillation ([place])     a. Historically difficult rate control. b. s/p CRT-D implantation with anticipation of AV junction  ablation but patient then was able to accomplish rate control and ablation not undertaken.; AVN ablation 08/2013 by Dr Lovena Le  . CARDIOMYOPATHY, PRIMARY, DILATED     a. Mixed ICM/NICM - EF 15% by echo 05/2009; St. Jude CRT-D implanted 06/2009. b. Echo 04/2013 - EF 20%, mild LVH.  [name] Chronic systolic CHF (congestive heart failure) ([place])   . CORONARY ARTERY DISEASE     a. BMS to LAD 04/2008. b. Cath 06/2009: nonobstructive disease. c. cath 05/16/2015 DES to prox LAD  . SLEEP APNEA   . Hypertension   . Hyperlipidemia   . Diabetes mellitus   . Osteoarthritis   . Tubular adenoma of colon   . Secondary hyperparathyroidism ([place])   . CKD (chronic kidney disease), stage IV ([place])     a. Right brachiocephalic AV fistula [id number]. Neph = [place].  . Vertigo   . Valvular heart disease     a. Echo 04/2013: mild AI, mild MR.  . Pulmonary HTN ([place])     a. PA pressure 36mmHg by echo 05/01/13.  . Prostate cancer [facility])     a. s/p radical prostatectomy.  . Colon cancer ([place])   . Skin cancer   . Presence of permanent cardiac pacemaker     Past Surgical History  Procedure Laterality Date  . Colectomy    . Prostatectomy    . Penile prosthesis placement    . Removed prosthetic eye    . Ptca      stent placed  . Total knee arthroplasty  2008  right  . Pacemaker insertion  2010  . Av fistula placement  03/30/2012    Procedure: ARTERIOVENOUS (AV) FISTULA CREATION;  Surgeon: Angelia Mould, MD;  Location: [facility];  Service: Vascular;  Laterality: Right;  Creation of BrachioCephalic Fistula Right arm  . Eye surgery      left eye prothesis  . Fracture surgery      collar bone, right knee replacement  . Bi-ventricular implantable cardioverter defibrillator  (crt-d)  2010    STJ CRTD implanted by Dr [name]  . Ablation  08/2013    AVN ablation by Dr Lovena Le  . Av node ablation N/A 09/12/2013    Procedure: AV NODE ABLATION;  Surgeon: [name], MD;  [facility];  Service: Cardiovascular;   Laterality: N/A;  . Insert / replace / remove pacemaker    . Joint replacement    . Cardiac catheterization N/A 05/09/2015    Procedure: Left Heart Cath and Coronary Angiography;  Surgeon: Sherren Mocha, MD;  Location: [place] CV LAB;  Service: Cardiovascular;  Laterality: N/A;  . Cardiac catheterization N/A 05/16/2015    Procedure: Coronary Stent Intervention;  Surgeon: Sherren Mocha, MD;  Location: [place] CV LAB;  Service: Cardiovascular;  Laterality: N/A;    Current Medications: Outpatient Prescriptions Prior to Visit  Medication Sig Dispense Refill  . acetaminophen (TYLENOL) 500 MG tablet Take 500 mg by mouth as needed for mild pain (2 tablets once daily if needed).     . calcitRIOL (ROCALTROL) 0.25 MCG capsule Take 0.25 mcg by mouth. Monday, Wed and Friday    . calcium carbonate (TUMS EX) 750 MG chewable tablet Chew 1 tablet by mouth 3 (three) times daily with meals.    [name] DIGOX 125 MCG tablet Take 1 tablet by mouth  every other day 45 tablet 1  . furosemide (LASIX) 80 MG tablet Take 1 tablet two times daily.  May take one extra tablet daily for symptoms or 3 pound weight gain. 210 tablet 2  . hydrALAZINE (APRESOLINE) 25 MG tablet Take 1 and 1/2 tablets by  mouth 3 times daily 315 tablet 1  . insulin lispro protamine-lispro (HUMALOG MIX 75/25) (75-25) 100 UNIT/ML SUSP injection Inject 24 Units into the skin 2 (two) times daily with a meal. Currently using 24 units twice daily before breakfast and supper    . isosorbide mononitrate (IMDUR) 30 MG 24 hr tablet Take 1 tablet (30 mg total) by mouth daily. 30 tablet 3  . metoprolol succinate (TOPROL-XL) 100 MG 24 hr tablet Take 1 tablet by mouth  twice a day, with or  immediately following   meals 180 tablet 0  . multivitamin (RENA-VIT) TABS tablet Take 1 tablet by mouth daily.    . potassium chloride SA (K-DUR,KLOR-CON) 20 MEQ tablet Take 2 tablets (40 mEq total) by mouth daily. 60 tablet 3  . potassium chloride SA (K-DUR,KLOR-CON) 20  MEQ tablet Take 40 mEq by mouth daily.    [name] warfarin (COUMADIN) 5 MG tablet Take 2.5 mg (one-half tablet) by mouth Mondays, and one whole tablet by mouth al other days (tue, Wed, Thur, Fri, Sat, Sun) 90 tablet 1  . clopidogrel (PLAVIX) 75 MG tablet Take 1 tablet (75 mg total) by mouth daily. 90 tablet 2  . HUMALOG MIX 75/25 (75-25) 100 UNIT/ML SUSP injection Inject subcutaneously 26  units two times daily with  meals 40 mL 1  . potassium chloride SA (K-DUR,KLOR-CON) 20 MEQ tablet Take 1 tablet by mouth two  times daily 180 tablet 1   No facility-administered medications prior to visit.     Allergies:   Ace inhibitors and Prednisone   Social History   Social History  . Marital Status: Widowed    Spouse Name: N/A  . Number of Children: 4  . Years of Education: N/A   Occupational History  . retired    Social History Main Topics  . Smoking status: Former Smoker    Types: Cigarettes    Quit date: 09/22/1966  . Smokeless tobacco: Never Used  . Alcohol Use: 0.0 - 0.6 oz/week    0-1 Standard drinks or equivalent per week     Comment: 05/07/2015 "might have a couple drinks/month"  . Drug Use: No  . Sexual Activity: No   Other Topics Concern  . None   Social History Narrative     Family History:  The patient's family history includes Heart disease in his father and mother; Hypertension in his mother; Throat cancer in his father and mother.   ROS:   Please see the history of present illness.    Review of Systems  Respiratory: Positive for cough.   Hematologic/Lymphatic: Bruises/bleeds easily.  Musculoskeletal: Positive for back pain.  All other systems reviewed and are negative.   Physical Exam:    VS:  BP 150/76 mmHg  Pulse 75  Ht 5\' 11"  (1.803 m)  Wt 174 lb 6.4 oz (79.107 kg)  BMI 24.33 kg/m2   GEN: Well nourished, well developed, in no acute distress HEENT: normal Neck: no JVD, no masses Cardiac: Normal S1/S2, RRR; no murmurs,no edema    Respiratory:  clear to  auscultation bilaterally; no wheezing, rhonchi or rales GI: soft, nontender  MS: no deformity or atrophy Skin: warm and dry  Neuro:  no focal deficits  Psych: Alert and oriented x 3, normal affect  Wt Readings from Last 3 Encounters:  11/09/15 174 lb 6.4 oz (79.107 kg)  10/19/15 178 lb 4.8 oz (80.876 kg)  09/05/15 181 lb 3.2 oz (82.192 kg)      Studies/Labs Reviewed:     EKG:  EKG is   ordered today.  The ekg ordered today demonstrates V paced, HR 75  Recent Labs: 06/04/2015: ALT 19; B Natriuretic Peptide 1655.4*; Magnesium 2.3; TSH 0.925 06/05/2015: Hemoglobin 11.3*; Platelets 99* 06/11/2015: BUN 66*; Creatinine, Ser 4.18*; Potassium 3.8; Sodium 142   Recent Lipid Panel    Component Value Date/Time   CHOL 172 05/08/2015 0535   TRIG 211* 05/08/2015 0535   TRIG 136 08/17/2006 0807   HDL 25* 05/08/2015 0535   CHOLHDL 6.9 05/08/2015 0535   CHOLHDL 3.6 CALC 08/17/2006 0807   VLDL 42* 05/08/2015 0535   LDLCALC 105* 05/08/2015 0535   LDLDIRECT 102.7 08/03/2012 0929   LDLDIRECT 123.1 07/06/2006 0837    Additional studies/ records that were reviewed today include:   LHC 05/16/15 PCI 3.5 x 15 mm Resolute DES to the proximal LAD, scoring balloon angioplasty  [facility] 05/09/15 LAD: Proximal 75%, mid stent 25% ISR, small ostial D1 80% LCx: Irregularities RCA: Mid 50%  Echo 3/16 Mild LVH, EF 25-30%, diffuse HK, aortic sclerosis without stenosis, mild AI, mild MR, mild LAE, mild RAE, PASP 48 mmHg  Carotid US 8/14 Bilateral ICA 1-39%  ASSESSMENT:     1. Coronary artery disease involving native coronary artery of native heart without angina pectoris   2. Dilated cardiomyopathy ([place])   3. Essential hypertension   4. Chronic renal insufficiency, stage IV (severe)   5.  BIVentricular Defibrillator--St Jude   6. Hyperlipidemia   7. Chronic atrial fibrillation (HCC)     PLAN:     In order of problems listed above:  1. CAD: Status post DES to the proximal LAD. He had ostial D1  80% stenosis and a very small vessel which was treated medically.  Plavix has been continued for 6 months post PCI.  He is doing well without recurrent angina.  -  DC Plavix  -  Start ASA 81 mg QD  -  Continued nitrates, beta blocker.   2. Dilated Cardiomyopathy: This is a nonischemic cardiomyopathy. EF 25-30% by echocardiogram in 3/16. He is not on ACE inhibitor secondary to advanced chronic kidney disease. Continue beta blocker, hydralazine, nitrates.  3. Chronic Systolic CHF:   Volume stable.  He is NYHA 2.   4. HTN: Fair control.  Continue to monitor.     5. CKD stage V: FU with Dr. Lorrene Reid.     6. Status post CRT-D: Follow up with EP as planned.   7. Hyperlipidemia:  He has remained uninterested in statin or non-statin therapy.   8. Chronic Atrial Fibrillation: Warfarin is managed by primary care.    Medication Adjustments/Labs and Tests Ordered: Current medicines are reviewed at length with the patient today.  Concerns regarding medicines are outlined above.  Medication changes, Labs and Tests ordered today are outlined in the Patient Instructions noted below. Patient Instructions  Medication Instructions:  Your physician has recommended you make the following change in your medication:  1.  Once you complete the bottle of Plavix, you may START Aspirin 81 mg taking 1 tablet daily  Labwork: NONE ORDERED  Testing/Procedures: NONE ORDERED  Follow-Up: Your physician recommends that you schedule a follow-up appointment in: [place]  Any Other Special Instructions Will Be Listed Below (If Applicable).  If you need a refill on your cardiac medications before your next appointment, please call your pharmacy.   Signed, Richardson Dopp, PA-C  11/09/2015 2:13 PM    [facility] [address], [place], [place]  [address] Phone: [phone]; Fax: [phone]

## 2015-11-09 ENCOUNTER — Encounter: Payer: Self-pay | Admitting: Physician Assistant

## 2015-11-09 ENCOUNTER — Ambulatory Visit (INDEPENDENT_AMBULATORY_CARE_PROVIDER_SITE_OTHER): Payer: Medicare Other | Admitting: *Deleted

## 2015-11-09 ENCOUNTER — Ambulatory Visit (INDEPENDENT_AMBULATORY_CARE_PROVIDER_SITE_OTHER): Payer: Medicare Other | Admitting: Physician Assistant

## 2015-11-09 VITALS — BP 150/76 | HR 75 | Ht 71.0 in | Wt 174.4 lb

## 2015-11-09 DIAGNOSIS — I10 Essential (primary) hypertension: Secondary | ICD-10-CM | POA: Diagnosis not present

## 2015-11-09 DIAGNOSIS — N189 Chronic kidney disease, unspecified: Secondary | ICD-10-CM | POA: Diagnosis not present

## 2015-11-09 DIAGNOSIS — I482 Chronic atrial fibrillation, unspecified: Secondary | ICD-10-CM

## 2015-11-09 DIAGNOSIS — N184 Chronic kidney disease, stage 4 (severe): Secondary | ICD-10-CM

## 2015-11-09 DIAGNOSIS — I420 Dilated cardiomyopathy: Secondary | ICD-10-CM | POA: Diagnosis not present

## 2015-11-09 DIAGNOSIS — I2510 Atherosclerotic heart disease of native coronary artery without angina pectoris: Secondary | ICD-10-CM

## 2015-11-09 DIAGNOSIS — Z4502 Encounter for adjustment and management of automatic implantable cardiac defibrillator: Secondary | ICD-10-CM

## 2015-11-09 DIAGNOSIS — E785 Hyperlipidemia, unspecified: Secondary | ICD-10-CM

## 2015-11-09 MED ORDER — ASPIRIN EC 81 MG PO TBEC: 81.0000 mg | DELAYED_RELEASE_TABLET | Freq: Every day | ORAL | Status: AC

## 2015-11-09 NOTE — Progress Notes (Signed)
Remote ICD transmission.   

## 2015-11-09 NOTE — Patient Instructions (Addendum)
Medication Instructions:  Your physician has recommended you make the following change in your medication:  1.  Once you complete the bottle of Plavix, you may START Aspirin 81 mg taking 1 tablet daily  Labwork: NONE ORDERED  Testing/Procedures: NONE ORDERED  Follow-Up: Your physician recommends that you schedule a follow-up appointment in: [place]  Any Other Special Instructions Will Be Listed Below (If Applicable).  If you need a refill on your cardiac medications before your next appointment, please call your pharmacy.

## 2015-11-12 ENCOUNTER — Ambulatory Visit (INDEPENDENT_AMBULATORY_CARE_PROVIDER_SITE_OTHER): Payer: Medicare Other | Admitting: General Practice

## 2015-11-12 ENCOUNTER — Encounter: Payer: Self-pay | Admitting: *Deleted

## 2015-11-12 ENCOUNTER — Other Ambulatory Visit: Payer: Self-pay | Admitting: *Deleted

## 2015-11-12 VITALS — BP 136/70 | HR 75 | Resp 18

## 2015-11-12 DIAGNOSIS — I5022 Chronic systolic (congestive) heart failure: Secondary | ICD-10-CM

## 2015-11-12 DIAGNOSIS — Z5181 Encounter for therapeutic drug level monitoring: Secondary | ICD-10-CM | POA: Diagnosis not present

## 2015-11-12 DIAGNOSIS — I482 Chronic atrial fibrillation, unspecified: Secondary | ICD-10-CM

## 2015-11-12 LAB — POCT INR: INR: 4.6

## 2015-11-12 NOTE — Progress Notes (Signed)
Pre visit review using our clinic review tool, if applicable. No additional management support is needed unless otherwise documented below in the visit note. INR is high today.  Patient denies extra doses.  Encouraged patient to purchase and use pill box to help manage medication.  Patient states that his diet has been about the same so he is not sure why there is a fluctuation.  Encouraged patient to eat additional 2 to 3 servings of dark green leafy greens per week.  Instructed patient to go to the ER if any unusual bleeding occurs.  Pt verbalized understanding.

## 2015-11-12 NOTE — Addendum Note (Signed)
Addended by: Dannielle Huh A on: 11/12/2015 11:53 PM   Modules accepted: Orders

## 2015-11-12 NOTE — Patient Outreach (Signed)
[facility]) Care Management   11/12/2015  [place] 04/23/1931 [id number]  [name] Devins. is an 82 y.o. male  Subjective: Patient reports "getting much better" after few weeks of having viral bronchial infection. He states being on "green zone" on the heart failure zone tool.   Objective: BP 136/70 mmHg  Pulse 75  Resp 18  SpO2 98%   Review of Systems  Constitutional: Negative.   HENT: Positive for hearing loss.        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 Negative for sputum production, shortness of breath and wheezing.        Diminished lung sounds otherwise clear to auscultation Respirations even and unlabored Uses CPAP at hs Minimal cough with no mucus production Shortness of breath on physical exertion  Cardiovascular: Positive for leg swelling. Negative for chest pain.       Regular rate and rhythm Has pacemaker to left upper chest Trace edema noted more to left ankle  Gastrointestinal: Negative for nausea, vomiting and abdominal pain.       Positive bowel sounds Abdomen soft and non-tender  Genitourinary: Positive for frequency.  Musculoskeletal: Negative for falls.  Skin: Negative.        Dark age spots to face  Neurological: Negative.   Endo/Heme/Allergies: Negative.   Psychiatric/Behavioral: Negative.     Physical Exam  Current Medications:   Current Outpatient Prescriptions  Medication Sig Dispense Refill  . acetaminophen (TYLENOL) 500 MG tablet Take 500 mg by mouth as needed for mild pain (2 tablets once daily if needed).     . calcitRIOL (ROCALTROL) 0.25 MCG capsule Take 0.25 mcg by mouth. Monday, Wed and Friday    . calcium carbonate (TUMS EX) 750 MG chewable tablet Chew 1 tablet by mouth 3 (three) times daily with meals.    [name] DIGOX 125 MCG tablet Take 1 tablet by mouth  every other day 45  tablet 1  . furosemide (LASIX) 80 MG tablet Take 1 tablet two times daily.  May take one extra tablet daily for symptoms or 3 pound weight gain. 210 tablet 2  . hydrALAZINE (APRESOLINE) 25 MG tablet Take 1 and 1/2 tablets by  mouth 3 times daily 315 tablet 1  . insulin lispro protamine-lispro (HUMALOG MIX 75/25) (75-25) 100 UNIT/ML SUSP injection Inject 24 Units into the skin 2 (two) times daily with a meal. Currently using 24 units twice daily before breakfast and supper    . isosorbide mononitrate (IMDUR) 30 MG 24 hr tablet Take 1 tablet (30 mg total) by mouth daily. 30 tablet 3  . metoprolol succinate (TOPROL-XL) 100 MG 24 hr tablet Take 1 tablet by mouth  twice a day, with or  immediately following   meals 180 tablet 0  . multivitamin (RENA-VIT) TABS tablet Take 1 tablet by mouth daily.    . potassium chloride SA (K-DUR,KLOR-CON) 20 MEQ tablet Take 2 tablets (40 mEq total) by mouth daily. 60 tablet 3  . warfarin (COUMADIN) 5 MG tablet Take 2.5 mg (one-half tablet) by mouth Mondays, and one whole tablet by mouth al other days (tue, Wed, Thur, Fri, Sat, Sun) 90 tablet 1  . aspirin EC 81 MG tablet Take 1 tablet (81 mg total) by mouth daily. (Patient not taking: Reported on 11/12/2015) 90 tablet 3  . potassium  chloride SA (K-DUR,KLOR-CON) 20 MEQ tablet Take 40 mEq by mouth daily. Reported on 11/12/2015     No current facility-administered medications for this visit.    Functional Status:   In your present state of health, do you have any difficulty performing the following activities: 11/12/2015 10/12/2015  Hearing? [name] [name]  Vision? Y Y  Difficulty concentrating or making decisions? N N  Walking or climbing stairs? N N  Dressing or bathing? N N  Doing errands, shopping? N N  Preparing Food and eating ? N N  Using the Toilet? N N  In the past six months, have you accidently leaked urine? N N  Do you have problems with loss of bowel control? N N  Managing your Medications? N N  Managing your  Finances? N N  Housekeeping or managing your Housekeeping? N N    Fall/Depression Screening:    PHQ 2/9 Scores 11/12/2015 10/12/2015 09/07/2015 08/23/2015 07/02/2015 06/11/2015 06/21/2014  PHQ - 2 Score 0 0 0 0 0 0 1    Assessment:   82 year old male being seen for heart failure management and education. Arrived at patient's two-storey house. Patient is by himself and living alone. Patient and care management coordinator present during this visit. Patient verbalized that he feels much better after having some upper respiratory symptoms from viral bronchial infection that was managed by rest and hydration.  He still has slight cough with no mucus production as stated. He denies any chest discomfort/ pain, no increased swelling to lower extremities and no reportable weight gain. His weight today is 174 pounds. His daily weight ranges from 173- 176 pounds.He has an order to take one extra tablet of Lasix daily for symptoms or 3 pounds weight gain and he knows to report it. Patient mentions that appetite is good and sleeps well for 8- 10 hours a day. EMMI educational materials on heart failure provided for patient to read (additional information) and ask questions if any.  According to patient, he was seen at the cardiologist office on 2/17 and EKG was done which showed no significant concern per patient. He continues to be on "battery watch" for his pacemaker. He was instructed to restart Aspirin 81 mg daily once remaining supply of Plavix is finished (for 6 months). His hypertension is stable with medications, diet and exercise. His blood pressure remains on 140/70 +/- 5 as reported. Patient states attending exercise sessions at the [facility] (exercise program). He will continue to attend the program with a personal trainer to work on upper body strength per report.   [name] [name] verbalize no help needed in managing his diabetes.  He recalls that his A1c is 6.5. Patient reports maintaining blood sugars  under 100. He is very aware of hypoglycemia and hyperglycemia management. He reports compliance with diabetic, low salt, heart healthy diet. He also reports checking his feet daily. He is seen regularly by his podiatrist (Dr. Caffie Pinto). Patient was seen by Dr. Lorrene Reid on 2/9 and everything looks good with no new orders as stated.  Follow-up cardiology visit is scheduled on 03/14/16. Primary care provider follow-up appointment is scheduled on 3/15. Transportation to his doctor's follow-up visit is provided by step daughter or himself.   Per patient, he had been attending out of home activities like going to church every Sunday, attending church luncheons, attending  [employer] and eating out at least twice a week.  Patient denies any other needs or issues at this time. He agreed to be transitioned to  health coach for further disease management. Patient still needs further reinforcement on heart failure management. He is aware to call [facility], care management coordinator or 24-hour nurse line as needed or if further needs arise. Patient has contact informations with him .    Plan: Will refer patient to [place] coach for further disease management of Congestive heart failure.  Will notify primary care provider of case closure on community care management's standpoint, however, will transition patient to [place].   THN CM Care Plan Problem One        Most Recent Value   Care Plan Problem One  lack of knowledge about heart failure management   Role Documenting the Problem One  Care Management Coordinator   Care Plan for Problem One  Active   THN Long Term Goal (31-90 days)  patient will verbalize at least 3 ways to live life better with heart failure in the next 31 days   THN Long Term Goal Start Date  09/06/15   [facility] Long Term Goal Met Date  11/12/15 [facility] met.]   Interventions for Problem One Long Term Goal  provide [facility] heart failure packet, and discussed,  educated to the booklet regarding  living better with heart failure and encouraged to read and ask questions if any,  encourage adherence to medications,  encourage to continue attending providers' follow-up appointments,  referred to [facility] PREP program  to transition after cardiac rehab exercises,  encourage to evaluate daily status using heart failure zones,  [facility] calendar provided to record daily weight monitoring,  advise to read food labels and monitor salt intake    THN CM Short Term Goal #1 (0-30 days)  patient will identify the 3 heart failure zones in the next 30 days   THN CM Short Term Goal #1 Start Date  09/06/15   [facility] CM Short Term Goal #1 Met Date  10/12/15   THN CM Short Term Goal #2 (0-30 days)  patient will identify at least 4 signs and symptoms of heart failure exacerbation and when to call the doctor in the next 30 days   THN CM Short Term Goal #2 Start Date  09/06/15   [facility] CM Short Term Goal #2 Met Date  10/12/15 [Goal met- needs reinforcement]   THN CM Short Term Goal #3 (0-30 days)  patient will continue to weigh daily and report weight gain of 3 pounds overnight and 5 pounds in a week in the next 30 days   THN CM Short Term Goal #3 Start Date  10/12/15   Interventions for Short Tern Goal #3  remind patient to continue monitoring weight everyday and record,  remind to take weight in the morning after using the bathroom,  encourage patient to notify provider if he took extra Lasix as ordered for a 3 pound weight gain,  encourage to monitor for swelling to feet,legs,hands and report      [facility] CM Care Plan Problem Two        Most Recent Value   Care Plan Problem Two  Fear of inability to maintain strength, balance/ stamina and decrease in socialization    Role Documenting the Problem Two  Care Management [place] for Problem Two  Active   THN CM Short Term Goal #1 (0-30 days)  patient will be able to transition to [facility] PREP (Physician Referred Exercise Program) in the next 30 days   [facility] CM Short Term  Goal #1 Start Date  10/12/15 [  Restart goal 1/20-no time around the holidays & trip to [place] Short Term Goal #1 Met Date   11/12/15 [Goal met- attended to 2 sessions and continues to attend]   [facility] CM Short Term Goal #2 (0-30 days)  patient will get in contact with [facility] volunteer for conversation/ socialization in the next 30 days    THN CM Short Term Goal #2 Start Date  09/06/15   [facility] CM Short Term Goal #2 Met Date  10/12/15 [goal not met-no time around the holidays& trip to [place]   Interventions for Short Term Goal #2  explain to patient about [facility] volunteer service to provide socialization with patients who needs it,  refer patient to [facility] volunteer [name] [name]) to spend time  communicating (topics of interest) or providing socialization with patient to ease up his feeling alone/ loneliness   THN CM Short Term Goal #3 (0-30 days)  patient will report attendance to social activities in church or community in the next 30 days   [facility] CM Short Term Goal #3 Start Date  10/12/15   [facility] CM Short Term Goal #3 Met Date  11/12/15 [Goal met.]      Edwena Felty A. Olivette Beckmann, BSN, RN-BC [place] Management Coordinator Cell: [phone]

## 2015-11-23 ENCOUNTER — Other Ambulatory Visit: Payer: Self-pay | Admitting: *Deleted

## 2015-11-23 MED ORDER — METOPROLOL SUCCINATE ER 100 MG PO TB24: ORAL_TABLET | ORAL | Status: DC

## 2015-11-26 ENCOUNTER — Ambulatory Visit: Payer: Medicare Other

## 2015-11-27 ENCOUNTER — Other Ambulatory Visit: Payer: Self-pay | Admitting: Internal Medicine

## 2015-11-28 ENCOUNTER — Other Ambulatory Visit: Payer: Self-pay

## 2015-11-28 ENCOUNTER — Ambulatory Visit (INDEPENDENT_AMBULATORY_CARE_PROVIDER_SITE_OTHER): Payer: Medicare Other | Admitting: General Practice

## 2015-11-28 DIAGNOSIS — I482 Chronic atrial fibrillation, unspecified: Secondary | ICD-10-CM

## 2015-11-28 DIAGNOSIS — Z5181 Encounter for therapeutic drug level monitoring: Secondary | ICD-10-CM | POA: Diagnosis not present

## 2015-11-28 DIAGNOSIS — I5022 Chronic systolic (congestive) heart failure: Secondary | ICD-10-CM

## 2015-11-28 LAB — POCT INR: INR: 2.3

## 2015-11-28 NOTE — Progress Notes (Signed)
Pre visit review using our clinic review tool, if applicable. No additional management support is needed unless otherwise documented below in the visit note. 

## 2015-11-28 NOTE — Patient Outreach (Signed)
[facility]) Care Management  11/28/2015  [name] [name] 07/21/1931 [id number]   Telephone contact today with patient referred by community RN for disease management of CHF.  Patient reports doing well today.  Explained Health Coach disease management services, and patient is in agreement to regular telephonic contacts.  Gave him my contact information and encouraged him to call as needed.  Patient lives alone but is active and independent for ADLs and IADLs.  Began collecting initial assessment data.  Patient has an appointment with his PCP on 11-16-15, and requested we schedule an appointment for after the 24th.  Appointment scheduled for 11-20-15.  Candie Mile, RN, MSN [place] [phone] Fax [phone]

## 2015-11-29 ENCOUNTER — Telehealth: Payer: Self-pay | Admitting: Cardiovascular Disease

## 2015-11-29 ENCOUNTER — Telehealth: Payer: Self-pay | Admitting: Internal Medicine

## 2015-11-29 MED ORDER — METOPROLOL SUCCINATE ER 100 MG PO TB24: ORAL_TABLET | ORAL | Status: DC

## 2015-11-29 NOTE — Telephone Encounter (Signed)
New message       Pt states that when he went to get his presc, they told him he needed an appt.  He was seen on 11-09-15 by Richardson Dopp and is due to see Dr Burt Knack in June.  He want to talk to someone in the refill dept about his metoprolol refill

## 2015-11-29 NOTE — Telephone Encounter (Signed)
called to resolve issue about metoprolol, pt saw scott and under our policy can have a year of refills, refills sent in. pt aware.

## 2015-11-29 NOTE — Telephone Encounter (Signed)
Spoke to pt, told him I am not sure why Rx was only filled for 90 days. Told pt Rx was filled by Dr. Antionette Char office on 3/3 and says you need an appt. Pt said he was just there and saw the PA. Told him would need to contact Dr. Antionette Char office and ask why only filled for 90 days. Pt verbalized understanding.

## 2015-11-29 NOTE — Telephone Encounter (Signed)
Pt called to find out why his metoprolol succinate (TOPROL-XL) 100 MG 24 hr tablet   was only filled for 90 days? Pt states Dr Raliegh Ip refilled this on 11/23/15, but according to the history, it was sent to Dr Burt Knack. Advised pt he has an appointment on 3/24 to see Dr Raliegh Ip. And he could get his years prescription then. But pt states he still does not know why we did not refill this medicine for a year. Pt would like to know what has changed. Pt would like a call back to explain.

## 2015-12-04 LAB — CUP PACEART REMOTE DEVICE CHECK
Battery Remaining Longevity: 9 mo
Battery Remaining Percentage: 22 %
Implantable Lead Implant Date: 20101028
Implantable Lead Model: 4196
Lead Channel Setting Sensing Sensitivity: 0.5 mV
MDC IDC LEAD IMPLANT DT: 20101028
MDC IDC LEAD LOCATION: 753858
MDC IDC LEAD LOCATION: 753860
MDC IDC LEAD MODEL: 7121
MDC IDC MSMT BATTERY VOLTAGE: 2.78 V
MDC IDC PG SERIAL: 595859
MDC IDC SESS DTM: 20170217071259
MDC IDC SET LEADCHNL LV PACING AMPLITUDE: 5 V
MDC IDC SET LEADCHNL LV PACING PULSEWIDTH: 1 ms
MDC IDC SET LEADCHNL RV PACING AMPLITUDE: 2.5 V
MDC IDC SET LEADCHNL RV PACING PULSEWIDTH: 0.6 ms

## 2015-12-05 ENCOUNTER — Encounter: Payer: Self-pay | Admitting: Cardiology

## 2015-12-06 ENCOUNTER — Other Ambulatory Visit: Payer: Self-pay

## 2015-12-06 MED ORDER — METOPROLOL SUCCINATE ER 100 MG PO TB24: ORAL_TABLET | ORAL | Status: DC

## 2015-12-07 ENCOUNTER — Other Ambulatory Visit: Payer: Self-pay | Admitting: Internal Medicine

## 2015-12-10 ENCOUNTER — Ambulatory Visit (INDEPENDENT_AMBULATORY_CARE_PROVIDER_SITE_OTHER): Payer: Medicare Other | Admitting: *Deleted

## 2015-12-10 ENCOUNTER — Telehealth: Payer: Self-pay | Admitting: Internal Medicine

## 2015-12-10 ENCOUNTER — Telehealth: Payer: Self-pay

## 2015-12-10 ENCOUNTER — Other Ambulatory Visit: Payer: Self-pay | Admitting: General Practice

## 2015-12-10 DIAGNOSIS — Z4502 Encounter for adjustment and management of automatic implantable cardiac defibrillator: Secondary | ICD-10-CM

## 2015-12-10 DIAGNOSIS — I420 Dilated cardiomyopathy: Secondary | ICD-10-CM

## 2015-12-10 MED ORDER — WARFARIN SODIUM 5 MG PO TABS: ORAL_TABLET | ORAL | Status: DC

## 2015-12-10 NOTE — Telephone Encounter (Signed)
Pt requesting warfarin (COUMADIN) 5 MG tablet Pt las office visit 10/19/15 Pt last Rx refill 08/09/15 #90 with 1 refill

## 2015-12-10 NOTE — Telephone Encounter (Signed)
Spoke with patient- 7.9 months remaining to ERI. He verbalizes understanding.

## 2015-12-10 NOTE — Telephone Encounter (Signed)
New message:   Please call,concerning his transmission.

## 2015-12-11 ENCOUNTER — Other Ambulatory Visit: Payer: Self-pay | Admitting: Student

## 2015-12-11 ENCOUNTER — Ambulatory Visit (INDEPENDENT_AMBULATORY_CARE_PROVIDER_SITE_OTHER): Payer: Medicare Other | Admitting: Podiatry

## 2015-12-11 ENCOUNTER — Encounter: Payer: Self-pay | Admitting: Podiatry

## 2015-12-11 VITALS — Ht 71.0 in | Wt 174.0 lb

## 2015-12-11 DIAGNOSIS — M79606 Pain in leg, unspecified: Secondary | ICD-10-CM

## 2015-12-11 DIAGNOSIS — B351 Tinea unguium: Secondary | ICD-10-CM | POA: Diagnosis not present

## 2015-12-11 DIAGNOSIS — E0842 Diabetes mellitus due to underlying condition with diabetic polyneuropathy: Secondary | ICD-10-CM | POA: Diagnosis not present

## 2015-12-11 NOTE — Patient Instructions (Signed)
Seen for hypertrophic nails. All nails debrided. Return as needed.  

## 2015-12-11 NOTE — Progress Notes (Signed)
Remote ICD transmission.   

## 2015-12-11 NOTE — Progress Notes (Signed)
Subjective:  82 y.o. year old male IDDM presents requesting toe nails trimmed today.   Objective:  Dermatologic:  Thick dystrophic nails x 10. Skin: No skin lesions.  Vascular:  Dorsalis pedis pulses palpable on both feet.  Posterior tibial pulses are not palpable on both feet.  No edema or erythema, no ischemic changes noted.  Orthopedic:  Cavus type foot without gross deformity.  Neurologic:  Failed respond to Monofilament sensory testing bilateral. Vibratory and Ankle DTR is within normal bilateral.   Assessment:  Dystrophic mycotic nails x 9.  Diabetic neuropathy.   Treatment: All mycotic nails and ingrown nails debrided.  May benefit from diabetic shoes for neuropathy.  Return in 3 months or as needed

## 2015-12-13 ENCOUNTER — Other Ambulatory Visit: Payer: Self-pay | Admitting: Student

## 2015-12-14 ENCOUNTER — Telehealth: Payer: Self-pay | Admitting: *Deleted

## 2015-12-14 ENCOUNTER — Ambulatory Visit (INDEPENDENT_AMBULATORY_CARE_PROVIDER_SITE_OTHER): Payer: Medicare Other | Admitting: Internal Medicine

## 2015-12-14 ENCOUNTER — Other Ambulatory Visit: Payer: Self-pay | Admitting: Internal Medicine

## 2015-12-14 ENCOUNTER — Encounter: Payer: Self-pay | Admitting: Internal Medicine

## 2015-12-14 VITALS — BP 152/80 | HR 76 | Temp 97.8°F | Resp 20 | Ht 71.0 in | Wt 177.0 lb

## 2015-12-14 DIAGNOSIS — E0821 Diabetes mellitus due to underlying condition with diabetic nephropathy: Secondary | ICD-10-CM

## 2015-12-14 DIAGNOSIS — I2510 Atherosclerotic heart disease of native coronary artery without angina pectoris: Secondary | ICD-10-CM

## 2015-12-14 DIAGNOSIS — E0841 Diabetes mellitus due to underlying condition with diabetic mononeuropathy: Secondary | ICD-10-CM

## 2015-12-14 DIAGNOSIS — I482 Chronic atrial fibrillation, unspecified: Secondary | ICD-10-CM

## 2015-12-14 DIAGNOSIS — N189 Chronic kidney disease, unspecified: Secondary | ICD-10-CM

## 2015-12-14 DIAGNOSIS — N184 Chronic kidney disease, stage 4 (severe): Secondary | ICD-10-CM

## 2015-12-14 DIAGNOSIS — I10 Essential (primary) hypertension: Secondary | ICD-10-CM

## 2015-12-14 LAB — BASIC METABOLIC PANEL
BUN: 78 mg/dL — AB (ref 6–23)
CALCIUM: 9.8 mg/dL (ref 8.4–10.5)
CO2: 27 mEq/L (ref 19–32)
CREATININE: 4.92 mg/dL — AB (ref 0.40–1.50)
Chloride: 102 mEq/L (ref 96–112)
GFR: 12.07 mL/min — AB (ref >60.00)
Glucose, Bld: 207 mg/dL — ABNORMAL HIGH (ref 70–99)
Potassium: 4.2 mEq/L (ref 3.5–5.1)
Sodium: 142 mEq/L (ref 135–145)

## 2015-12-14 LAB — HEMOGLOBIN A1C: HEMOGLOBIN A1C: 7.3 % — AB (ref 4.6–6.5)

## 2015-12-14 NOTE — Progress Notes (Signed)
Subjective:    Patient ID: [name] [name]., male    DOB: 08-15-1931, 82 y.o.   MRN: [id number]  HPI  Lab Results  Component Value Date   HGBA1C 6.8* 05/22/2015   82 year old patient who has a history of coronary artery disease and chronic systolic heart failure.  This has been well compensated.  No shortness of breath or peripheral edema He has type 2 diabetes which has been well-controlled.  Complications include neuropathy and nephropathy.  He was seen by podiatry recently He has a history of osteoarthritis and dyslipidemia.  No cardiopulmonary complaints Is followed by cardiology.  He has essential hypertension  Past Medical History  Diagnosis Date  . Chronic atrial fibrillation ([place])     a. Historically difficult rate control. b. s/p CRT-D implantation with anticipation of AV junction ablation but patient then was able to accomplish rate control and ablation not undertaken.; AVN ablation 08/2013 by Dr Lovena Le  . CARDIOMYOPATHY, PRIMARY, DILATED     a. Mixed ICM/NICM - EF 15% by echo 05/2009; St. Jude CRT-D implanted 06/2009. b. Echo 04/2013 - EF 20%, mild LVH.  [name] Chronic systolic CHF (congestive heart failure) ([place])   . CORONARY ARTERY DISEASE     a. BMS to LAD 04/2008. b. Cath 06/2009: nonobstructive disease. c. cath 05/16/2015 DES to prox LAD  . SLEEP APNEA   . Hypertension   . Hyperlipidemia   . Diabetes mellitus   . Osteoarthritis   . Tubular adenoma of colon   . Secondary hyperparathyroidism ([place])   . CKD (chronic kidney disease), stage IV ([place])     a. Right brachiocephalic AV fistula [id number]. Neph = [place].  . Vertigo   . Valvular heart disease     a. Echo 04/2013: mild AI, mild MR.  . Pulmonary HTN ([place])     a. PA pressure 65mmHg by echo 05/01/13.  . Prostate cancer [facility])     a. s/p radical prostatectomy.  . Colon cancer ([place])   . Skin cancer   . Presence of permanent cardiac pacemaker     Social History   Social History  . Marital Status: Widowed    Spouse  Name: N/A  . Number of Children: 4  . Years of Education: N/A   Occupational History  . retired    Social History Main Topics  . Smoking status: Former Smoker    Types: Cigarettes    Quit date: 09/22/1966  . Smokeless tobacco: Never Used  . Alcohol Use: 0.0 - 0.6 oz/week    0-1 Standard drinks or equivalent per week     Comment: 05/07/2015 "might have a couple drinks/month"  . Drug Use: No  . Sexual Activity: No   Other Topics Concern  . Not on file   Social History Narrative    Past Surgical History  Procedure Laterality Date  . Colectomy    . Prostatectomy    . Penile prosthesis placement    . Removed prosthetic eye    . Ptca      stent placed  . Total knee arthroplasty  2008    right  . Pacemaker insertion  2010  . Av fistula placement  03/30/2012    Procedure: ARTERIOVENOUS (AV) FISTULA CREATION;  Surgeon: Angelia Mould, MD;  Location: [facility];  Service: Vascular;  Laterality: Right;  Creation of BrachioCephalic Fistula Right arm  . Eye surgery      left eye prothesis  . Fracture surgery      collar bone,  right knee replacement  . Bi-ventricular implantable cardioverter defibrillator  (crt-d)  2010    STJ CRTD implanted by Dr [name]  . Ablation  08/2013    AVN ablation by Dr Lovena Le  . Av node ablation N/A 09/12/2013    Procedure: AV NODE ABLATION;  Surgeon: [name], MD;  Location: [facility] CATH LAB;  Service: Cardiovascular;  Laterality: N/A;  . Insert / replace / remove pacemaker    . Joint replacement    . Cardiac catheterization N/A 05/09/2015    Procedure: Left Heart Cath and Coronary Angiography;  Surgeon: Sherren Mocha, MD;  Location: [facility] CV LAB;  Service: Cardiovascular;  Laterality: N/A;  . Cardiac catheterization N/A 05/16/2015    Procedure: Coronary Stent Intervention;  Surgeon: Sherren Mocha, MD;  Location: [facility] CV LAB;  Service: Cardiovascular;  Laterality: N/A;    Family History  Problem Relation Age of Onset  . Heart disease  Father   . Throat cancer Father   . Throat cancer Mother   . Heart disease Mother   . Hypertension Mother     Allergies  Allergen Reactions  . Ace Inhibitors Other (See Comments)    Stopped by Dr. Burt Knack due to progressive renal failure  . Prednisone Other (See Comments)    Raised blood sugar to almost 900    Current Outpatient Prescriptions on File Prior to Visit  Medication Sig Dispense Refill  . acetaminophen (TYLENOL) 500 MG tablet Take 500 mg by mouth as needed for mild pain (2 tablets once daily if needed).     [name] aspirin EC 81 MG tablet Take 1 tablet (81 mg total) by mouth daily. 90 tablet 3  . calcitRIOL (ROCALTROL) 0.25 MCG capsule Take 0.25 mcg by mouth. Monday, Wed and Friday    . calcium carbonate (TUMS EX) 750 MG chewable tablet Chew 1 tablet by mouth 3 (three) times daily with meals.    [name] DIGOX 125 MCG tablet Take 1 tablet by mouth  every other day 45 tablet 1  . furosemide (LASIX) 80 MG tablet Take 1 tablet two times daily.  May take one extra tablet daily for symptoms or 3 pound weight gain. 210 tablet 2  . HUMALOG MIX 75/25 (75-25) 100 UNIT/ML SUSP injection Inject subcutaneously 26  units two times daily with  meals 5 vial 3  . hydrALAZINE (APRESOLINE) 25 MG tablet Take 1 and 1/2 tablets by  mouth 3 times daily 315 tablet 1  . isosorbide mononitrate (IMDUR) 30 MG 24 hr tablet Take 1 tablet by mouth  daily 90 tablet 1  . metoprolol succinate (TOPROL-XL) 100 MG 24 hr tablet Take 1 tablet by mouth  twice a day, with or  immediately following   meals 180 tablet 2  . multivitamin (RENA-VIT) TABS tablet Take 1 tablet by mouth daily.    . potassium chloride SA (K-DUR,KLOR-CON) 20 MEQ tablet Take 2 tablets (40 mEq total) by mouth daily. 60 tablet 3  . warfarin (COUMADIN) 5 MG tablet Take 2.5 mg (one-half tablet) by mouth Mondays, and one whole tablet by mouth al other days (tue, Wed, Thur, Fri, Sat, Sun) 90 tablet 1   No current facility-administered medications on file prior  to visit.    BP 152/80 mmHg  Pulse 76  Temp(Src) 97.8 F (36.6 C) (Oral)  Resp 20  Ht 5\' 11"  (1.803 m)  Wt 177 lb (80.287 kg)  BMI 24.70 kg/m2  SpO2 99%      Review of Systems  Constitutional:  Negative for fever, chills, appetite change and fatigue.  HENT: Negative for congestion, dental problem, ear pain, hearing loss, sore throat, tinnitus, trouble swallowing and voice change.   Eyes: Negative for pain, discharge and visual disturbance.  Respiratory: Negative for cough, chest tightness, wheezing and stridor.   Cardiovascular: Negative for chest pain, palpitations and leg swelling.  Gastrointestinal: Negative for nausea, vomiting, abdominal pain, diarrhea, constipation, blood in stool and abdominal distention.  Genitourinary: Negative for urgency, hematuria, flank pain, discharge, difficulty urinating and genital sores.  Musculoskeletal: Positive for gait problem. Negative for myalgias, back pain, joint swelling, arthralgias and neck stiffness.  Skin: Negative for rash.  Neurological: Negative for dizziness, syncope, speech difficulty, weakness, numbness and headaches.  Hematological: Negative for adenopathy. Does not bruise/bleed easily.  Psychiatric/Behavioral: Negative for behavioral problems and dysphoric mood. The patient is not nervous/anxious.        Objective:   Physical Exam  Constitutional: He is oriented to person, place, and time. He appears well-developed.  HENT:  Head: Normocephalic.  Right Ear: External ear normal.  Left Ear: External ear normal.  Eyes: Conjunctivae and EOM are normal.  Neck: Normal range of motion.  Cardiovascular: Normal rate and normal heart sounds.   Pedal pulses diminished  Pulmonary/Chest: Breath sounds normal.  Abdominal: Bowel sounds are normal.  Musculoskeletal: Normal range of motion. He exhibits no edema or tenderness.  Neurological: He is alert and oriented to person, place, and time.  Absent monofilament testing  Skin:    Onychomycotic toenail changes  Psychiatric: He has a normal mood and affect. His behavior is normal.          Assessment & Plan:   Diabetes mellitus complicated by nephropathy and neuropathy.  Recent eye examination was free of retinopathy Essential hypertension Dyslipidemia Coronary artery disease Compensated systolic heart failure  Recheck 4 months  Check hemoglobin A1c  Forms for diabetic shoes completed

## 2015-12-14 NOTE — Progress Notes (Signed)
Pre visit review using our clinic review tool, if applicable. No additional management support is needed unless otherwise documented below in the visit note. 

## 2015-12-14 NOTE — Patient Instructions (Signed)
Limit your sodium (Salt) intake    It is important that you exercise regularly, at least 20 minutes 3 to 4 times per week.  If you develop chest pain or shortness of breath seek  medical attention.   Please check your hemoglobin A1c every 3 months  Report any excessive weight gain

## 2015-12-14 NOTE — Telephone Encounter (Signed)
CRITICAL VALUE STICKER  CRITICAL VALUE: Creatinine: 4.92, GFR: 12.06  RECEIVER (INITALS): DO  DATE & TIME NOTIFIED: 12/14/2015 at 1435  MD NOTIFIED: Dr. Elray Mcgregor  TIME OF NOTIFICATION: [id number]  RESPONSE:

## 2015-12-18 ENCOUNTER — Encounter (HOSPITAL_COMMUNITY): Payer: Self-pay | Admitting: *Deleted

## 2015-12-18 ENCOUNTER — Observation Stay (HOSPITAL_COMMUNITY)
Admission: EM | Admit: 2015-12-18 | Discharge: 2015-12-19 | Disposition: A | Discharge status: Home / Self Care | Payer: Medicare Other | Attending: Family Medicine | Admitting: Family Medicine

## 2015-12-18 ENCOUNTER — Emergency Department (HOSPITAL_COMMUNITY): Payer: Medicare Other

## 2015-12-18 ENCOUNTER — Ambulatory Visit: Payer: Self-pay

## 2015-12-18 DIAGNOSIS — Z9049 Acquired absence of other specified parts of digestive tract: Secondary | ICD-10-CM | POA: Insufficient documentation

## 2015-12-18 DIAGNOSIS — Z85038 Personal history of other malignant neoplasm of large intestine: Secondary | ICD-10-CM | POA: Diagnosis not present

## 2015-12-18 DIAGNOSIS — Z96651 Presence of right artificial knee joint: Secondary | ICD-10-CM | POA: Insufficient documentation

## 2015-12-18 DIAGNOSIS — R791 Abnormal coagulation profile: Secondary | ICD-10-CM | POA: Diagnosis present

## 2015-12-18 DIAGNOSIS — E785 Hyperlipidemia, unspecified: Secondary | ICD-10-CM | POA: Insufficient documentation

## 2015-12-18 DIAGNOSIS — I252 Old myocardial infarction: Secondary | ICD-10-CM | POA: Insufficient documentation

## 2015-12-18 DIAGNOSIS — R0603 Acute respiratory distress: Secondary | ICD-10-CM | POA: Diagnosis present

## 2015-12-18 DIAGNOSIS — R7989 Other specified abnormal findings of blood chemistry: Secondary | ICD-10-CM | POA: Diagnosis not present

## 2015-12-18 DIAGNOSIS — I272 Other secondary pulmonary hypertension: Secondary | ICD-10-CM | POA: Insufficient documentation

## 2015-12-18 DIAGNOSIS — Z85828 Personal history of other malignant neoplasm of skin: Secondary | ICD-10-CM | POA: Insufficient documentation

## 2015-12-18 DIAGNOSIS — G4733 Obstructive sleep apnea (adult) (pediatric): Secondary | ICD-10-CM | POA: Diagnosis not present

## 2015-12-18 DIAGNOSIS — Z8546 Personal history of malignant neoplasm of prostate: Secondary | ICD-10-CM | POA: Diagnosis not present

## 2015-12-18 DIAGNOSIS — Z955 Presence of coronary angioplasty implant and graft: Secondary | ICD-10-CM | POA: Insufficient documentation

## 2015-12-18 DIAGNOSIS — Z87891 Personal history of nicotine dependence: Secondary | ICD-10-CM | POA: Insufficient documentation

## 2015-12-18 DIAGNOSIS — Z794 Long term (current) use of insulin: Secondary | ICD-10-CM | POA: Insufficient documentation

## 2015-12-18 DIAGNOSIS — Z4502 Encounter for adjustment and management of automatic implantable cardiac defibrillator: Secondary | ICD-10-CM

## 2015-12-18 DIAGNOSIS — Z95810 Presence of automatic (implantable) cardiac defibrillator: Secondary | ICD-10-CM | POA: Insufficient documentation

## 2015-12-18 DIAGNOSIS — I482 Chronic atrial fibrillation, unspecified: Secondary | ICD-10-CM

## 2015-12-18 DIAGNOSIS — E1129 Type 2 diabetes mellitus with other diabetic kidney complication: Secondary | ICD-10-CM | POA: Diagnosis present

## 2015-12-18 DIAGNOSIS — E1122 Type 2 diabetes mellitus with diabetic chronic kidney disease: Secondary | ICD-10-CM | POA: Diagnosis not present

## 2015-12-18 DIAGNOSIS — I2510 Atherosclerotic heart disease of native coronary artery without angina pectoris: Secondary | ICD-10-CM | POA: Diagnosis not present

## 2015-12-18 DIAGNOSIS — I10 Essential (primary) hypertension: Secondary | ICD-10-CM | POA: Diagnosis not present

## 2015-12-18 DIAGNOSIS — I420 Dilated cardiomyopathy: Secondary | ICD-10-CM | POA: Diagnosis not present

## 2015-12-18 DIAGNOSIS — Z66 Do not resuscitate: Secondary | ICD-10-CM | POA: Insufficient documentation

## 2015-12-18 DIAGNOSIS — Z86010 Personal history of colonic polyps: Secondary | ICD-10-CM | POA: Insufficient documentation

## 2015-12-18 DIAGNOSIS — I5043 Acute on chronic combined systolic (congestive) and diastolic (congestive) heart failure: Secondary | ICD-10-CM | POA: Insufficient documentation

## 2015-12-18 DIAGNOSIS — I5023 Acute on chronic systolic (congestive) heart failure: Secondary | ICD-10-CM

## 2015-12-18 DIAGNOSIS — Z7901 Long term (current) use of anticoagulants: Secondary | ICD-10-CM | POA: Insufficient documentation

## 2015-12-18 DIAGNOSIS — N184 Chronic kidney disease, stage 4 (severe): Secondary | ICD-10-CM | POA: Insufficient documentation

## 2015-12-18 DIAGNOSIS — R778 Other specified abnormalities of plasma proteins: Secondary | ICD-10-CM | POA: Diagnosis present

## 2015-12-18 DIAGNOSIS — I130 Hypertensive heart and chronic kidney disease with heart failure and stage 1 through stage 4 chronic kidney disease, or unspecified chronic kidney disease: Secondary | ICD-10-CM | POA: Diagnosis not present

## 2015-12-18 DIAGNOSIS — E0821 Diabetes mellitus due to underlying condition with diabetic nephropathy: Secondary | ICD-10-CM

## 2015-12-18 DIAGNOSIS — Z888 Allergy status to other drugs, medicaments and biological substances status: Secondary | ICD-10-CM | POA: Diagnosis not present

## 2015-12-18 DIAGNOSIS — Z7982 Long term (current) use of aspirin: Secondary | ICD-10-CM | POA: Diagnosis not present

## 2015-12-18 DIAGNOSIS — R748 Abnormal levels of other serum enzymes: Secondary | ICD-10-CM | POA: Insufficient documentation

## 2015-12-18 LAB — CBC
HEMATOCRIT: 34.3 % — AB (ref 39.0–52.0)
Hemoglobin: 10.6 g/dL — ABNORMAL LOW (ref 13.0–17.0)
MCH: 29.7 pg (ref 26.0–34.0)
MCHC: 30.9 g/dL (ref 30.0–36.0)
MCV: 96.1 fL (ref 78.0–100.0)
PLATELETS: 116 10*3/uL — AB (ref 150–400)
RBC: 3.57 MIL/uL — AB (ref 4.22–5.81)
RDW: 16.1 % — ABNORMAL HIGH (ref 11.5–15.5)
WBC: 6 10*3/uL (ref 4.0–10.5)

## 2015-12-18 LAB — BASIC METABOLIC PANEL
ANION GAP: 13 (ref 5–15)
BUN: 78 mg/dL — AB (ref 6–20)
CO2: 19 mmol/L — AB (ref 22–32)
CREATININE: 5.23 mg/dL — AB (ref 0.61–1.24)
Calcium: 9.2 mg/dL (ref 8.9–10.3)
Chloride: 110 mmol/L (ref 101–111)
GFR calc Af Amer: 11 mL/min — ABNORMAL LOW (ref >60)
GFR calc non Af Amer: 9 mL/min — ABNORMAL LOW (ref >60)
GLUCOSE: 200 mg/dL — AB (ref 65–99)
Potassium: 4.6 mmol/L (ref 3.5–5.1)
Sodium: 142 mmol/L (ref 135–145)

## 2015-12-18 LAB — D-DIMER, QUANTITATIVE (NOT AT ARMC): D DIMER QUANT: 3.24 ug{FEU}/mL — AB (ref 0.00–0.50)

## 2015-12-18 LAB — DIGOXIN LEVEL: Digoxin Level: 0.5 ng/mL — ABNORMAL LOW (ref 0.8–2.0)

## 2015-12-18 LAB — GLUCOSE, CAPILLARY
GLUCOSE-CAPILLARY: 213 mg/dL — AB (ref 65–99)
GLUCOSE-CAPILLARY: 121 mg/dL — AB (ref 65–99)
Glucose-Capillary: 65 mg/dL (ref 65–99)
Glucose-Capillary: 169 mg/dL — ABNORMAL HIGH (ref 65–99)

## 2015-12-18 LAB — PROCALCITONIN: PROCALCITONIN: 0.2 ng/mL

## 2015-12-18 LAB — MAGNESIUM: MAGNESIUM: 2.4 mg/dL (ref 1.7–2.4)

## 2015-12-18 LAB — I-STAT TROPONIN, ED: Troponin i, poc: 0.11 ng/mL (ref 0.00–0.08)

## 2015-12-18 LAB — CBG MONITORING, ED: GLUCOSE-CAPILLARY: 208 mg/dL — AB (ref 65–99)

## 2015-12-18 LAB — PROTIME-INR
INR: 3.11 — AB (ref 0.00–1.49)
PROTHROMBIN TIME: 31.5 s — AB (ref 11.6–15.2)

## 2015-12-18 LAB — BRAIN NATRIURETIC PEPTIDE: B Natriuretic Peptide: 981 pg/mL — ABNORMAL HIGH (ref 0.0–100.0)

## 2015-12-18 MED ORDER — INSULIN ASPART PROT & ASPART (70-30 MIX) 100 UNIT/ML ~~LOC~~ SUSP
20.0000 [IU] | Freq: Two times a day (BID) | SUBCUTANEOUS | Status: DC
  Administered 2015-12-18 – 2015-12-19 (×3): 20 [IU] via SUBCUTANEOUS
  Filled 2015-12-18: qty 10

## 2015-12-18 MED ORDER — POTASSIUM CHLORIDE CRYS ER 20 MEQ PO TBCR
40.0000 meq | EXTENDED_RELEASE_TABLET | Freq: Every day | ORAL | Status: DC
Start: 2015-12-18 — End: 2015-12-19
  Administered 2015-12-18 – 2015-12-19 (×2): 40 meq via ORAL
  Filled 2015-12-18 (×2): qty 2

## 2015-12-18 MED ORDER — SODIUM CHLORIDE 0.9 % IV SOLN: 250.0000 mL | INTRAVENOUS | Status: DC | PRN

## 2015-12-18 MED ORDER — DIGOXIN 125 MCG PO TABS
0.1250 mg | ORAL_TABLET | Freq: Every day | ORAL | Status: DC
  Administered 2015-12-18 – 2015-12-19 (×2): 0.125 mg via ORAL
  Filled 2015-12-18 (×2): qty 1

## 2015-12-18 MED ORDER — RENA-VITE PO TABS
1.0000 | ORAL_TABLET | Freq: Every day | ORAL | Status: DC
Start: 2015-12-18 — End: 2015-12-19
  Administered 2015-12-18 – 2015-12-19 (×2): 1 via ORAL
  Filled 2015-12-18 (×2): qty 1

## 2015-12-18 MED ORDER — HYDRALAZINE HCL 25 MG PO TABS
37.5000 mg | ORAL_TABLET | Freq: Three times a day (TID) | ORAL | Status: DC
  Administered 2015-12-18 – 2015-12-19 (×3): 37.5 mg via ORAL
  Filled 2015-12-18 (×3): qty 2

## 2015-12-18 MED ORDER — ACETAMINOPHEN 325 MG PO TABS: 650.0000 mg | ORAL_TABLET | ORAL | Status: DC | PRN

## 2015-12-18 MED ORDER — ONDANSETRON HCL 4 MG/2ML IJ SOLN: 4.0000 mg | Freq: Four times a day (QID) | INTRAMUSCULAR | Status: DC | PRN

## 2015-12-18 MED ORDER — FUROSEMIDE 10 MG/ML IJ SOLN
80.0000 mg | Freq: Once | INTRAMUSCULAR | Status: AC
  Administered 2015-12-18: 80 mg via INTRAVENOUS
  Filled 2015-12-18: qty 8

## 2015-12-18 MED ORDER — INSULIN ASPART 100 UNIT/ML ~~LOC~~ SOLN
0.0000 [IU] | Freq: Three times a day (TID) | SUBCUTANEOUS | Status: DC
  Administered 2015-12-18: 3 [IU] via SUBCUTANEOUS

## 2015-12-18 MED ORDER — IPRATROPIUM BROMIDE 0.06 % NA SOLN
2.0000 | Freq: Three times a day (TID) | NASAL | Status: DC
  Administered 2015-12-18 – 2015-12-19 (×4): 2 via NASAL
  Filled 2015-12-18: qty 15

## 2015-12-18 MED ORDER — METOPROLOL SUCCINATE ER 100 MG PO TB24
100.0000 mg | ORAL_TABLET | Freq: Every day | ORAL | Status: DC
  Administered 2015-12-18 – 2015-12-19 (×2): 100 mg via ORAL
  Filled 2015-12-18 (×2): qty 1

## 2015-12-18 MED ORDER — CALCIUM CARBONATE ANTACID 500 MG PO CHEW
1.0000 | CHEWABLE_TABLET | Freq: Three times a day (TID) | ORAL | Status: DC
  Administered 2015-12-18 – 2015-12-19 (×3): 200 mg via ORAL
  Filled 2015-12-18 (×3): qty 1

## 2015-12-18 MED ORDER — CALCITRIOL 0.25 MCG PO CAPS
0.2500 ug | ORAL_CAPSULE | Freq: Every day | ORAL | Status: DC
  Administered 2015-12-18 – 2015-12-19 (×2): 0.25 ug via ORAL
  Filled 2015-12-18 (×2): qty 1

## 2015-12-18 MED ORDER — ISOSORBIDE MONONITRATE ER 30 MG PO TB24
30.0000 mg | ORAL_TABLET | Freq: Every day | ORAL | Status: DC
  Administered 2015-12-18 – 2015-12-19 (×2): 30 mg via ORAL
  Filled 2015-12-18 (×2): qty 1

## 2015-12-18 MED ORDER — INSULIN ASPART 100 UNIT/ML ~~LOC~~ SOLN: 0.0000 [IU] | Freq: Every day | SUBCUTANEOUS | Status: DC

## 2015-12-18 MED ORDER — WARFARIN - PHARMACIST DOSING INPATIENT: Freq: Every day | Status: DC

## 2015-12-18 MED ORDER — ASPIRIN EC 81 MG PO TBEC
81.0000 mg | DELAYED_RELEASE_TABLET | Freq: Every day | ORAL | Status: DC
  Administered 2015-12-18 – 2015-12-19 (×2): 81 mg via ORAL
  Filled 2015-12-18 (×2): qty 1

## 2015-12-18 MED ORDER — METOPROLOL SUCCINATE ER 100 MG PO TB24: 100.0000 mg | ORAL_TABLET | Freq: Every day | ORAL | Status: DC

## 2015-12-18 MED ORDER — SODIUM CHLORIDE 0.9% FLUSH
3.0000 mL | Freq: Two times a day (BID) | INTRAVENOUS | Status: DC
  Administered 2015-12-18 – 2015-12-19 (×3): 3 mL via INTRAVENOUS

## 2015-12-18 MED ORDER — SODIUM CHLORIDE 0.9% FLUSH: 3.0000 mL | INTRAVENOUS | Status: DC | PRN

## 2015-12-18 NOTE — ED Provider Notes (Addendum)
CSN: [id number]     Arrival date & time 12/18/15  0442 History   First MD Initiated Contact with Patient 12/18/15 [phone]     Chief Complaint  Patient presents with  . Shortness of Breath     (Consider location/radiation/quality/duration/timing/severity/associated sxs/prior Treatment) HPI   Patietn is a very pleasant 82 year old male with history of cardiomyopathy, chronic A. fib, CVA, CHF. Patient is very vigilant about taking his Lasix. Patient has noticed no recent swelling or weight gain. However he has noticed increasing shortness of breath for last 2 nights. He's had to sleep with increased number of pillows and has has shortness of breath while walking short distances. Patient denies any acute chest pain.   patient called up his outreach cording or for CHF clinic who told him to come here to the emergency department.  Patient had no recent immobilization.    Past Medical History  Diagnosis Date  . Chronic atrial fibrillation ([place])     a. Historically difficult rate control. b. s/p CRT-D implantation with anticipation of AV junction ablation but patient then was able to accomplish rate control and ablation not undertaken.; AVN ablation 08/2013 by Dr Lovena Le  . CARDIOMYOPATHY, PRIMARY, DILATED     a. Mixed ICM/NICM - EF 15% by echo 05/2009; St. Jude CRT-D implanted 06/2009. b. Echo 04/2013 - EF 20%, mild LVH.  [name] Chronic systolic CHF (congestive heart failure) ([place])   . CORONARY ARTERY DISEASE     a. BMS to LAD 04/2008. b. Cath 06/2009: nonobstructive disease. c. cath 05/16/2015 DES to prox LAD  . SLEEP APNEA   . Hypertension   . Hyperlipidemia   . Diabetes mellitus   . Osteoarthritis   . Tubular adenoma of colon   . Secondary hyperparathyroidism ([place])   . CKD (chronic kidney disease), stage IV ([place])     a. Right brachiocephalic AV fistula [id number]. Neph = [place].  . Vertigo   . Valvular heart disease     a. Echo 04/2013: mild AI, mild MR.  . Pulmonary HTN ([place])     a. PA pressure  43mmHg by echo 05/01/13.  . Prostate cancer [facility])     a. s/p radical prostatectomy.  . Colon cancer ([place])   . Skin cancer   . Presence of permanent cardiac pacemaker    Past Surgical History  Procedure Laterality Date  . Colectomy    . Prostatectomy    . Penile prosthesis placement    . Removed prosthetic eye    . Ptca      stent placed  . Total knee arthroplasty  2008    right  . Pacemaker insertion  2010  . Av fistula placement  03/30/2012    Procedure: ARTERIOVENOUS (AV) FISTULA CREATION;  Surgeon: Angelia Mould, MD;  Location: [place];  Service: Vascular;  Laterality: Right;  Creation of BrachioCephalic Fistula Right arm  . Eye surgery      left eye prothesis  . Fracture surgery      collar bone, right knee replacement  . Bi-ventricular implantable cardioverter defibrillator  (crt-d)  2010    STJ CRTD implanted by Dr [name]  . Ablation  08/2013    AVN ablation by Dr Lovena Le  . Av node ablation N/A 09/12/2013    Procedure: AV NODE ABLATION;  Surgeon: [name], MD;  Location: [facility] CATH LAB;  Service: Cardiovascular;  Laterality: N/A;  . Insert / replace / remove pacemaker    . Joint replacement    . Cardiac  catheterization N/A 05/09/2015    Procedure: Left Heart Cath and Coronary Angiography;  Surgeon: Sherren Mocha, MD;  Location: [place] CV LAB;  Service: Cardiovascular;  Laterality: N/A;  . Cardiac catheterization N/A 05/16/2015    Procedure: Coronary Stent Intervention;  Surgeon: Sherren Mocha, MD;  Location: [place] CV LAB;  Service: Cardiovascular;  Laterality: N/A;   Family History  Problem Relation Age of Onset  . Heart disease Father   . Throat cancer Father   . Throat cancer Mother   . Heart disease Mother   . Hypertension Mother    Social History  Substance Use Topics  . Smoking status: Former Smoker    Types: Cigarettes    Quit date: 09/22/1966  . Smokeless tobacco: Never Used  . Alcohol Use: 0.0 - 0.6 oz/week    0-1 Standard drinks or  equivalent per week     Comment: 05/07/2015 "might have a couple drinks/month"    Review of Systems  Constitutional: Positive for fatigue. Negative for fever and activity change.  HENT: Positive for congestion.   Respiratory: Positive for shortness of breath. Negative for cough.   Cardiovascular: Negative for chest pain.  Gastrointestinal: Negative for abdominal pain.  Genitourinary: Negative for dysuria and urgency.  Musculoskeletal: Negative for arthralgias.  Allergic/Immunologic: Negative for immunocompromised state.  Neurological: Negative for seizures and speech difficulty.  Psychiatric/Behavioral: Negative for behavioral problems and agitation.  All other systems reviewed and are negative.     Allergies  Ace inhibitors and Prednisone  Home Medications   Prior to Admission medications   Medication Sig Start Date End Date Taking? Authorizing Provider  acetaminophen (TYLENOL) 500 MG tablet Take 500 mg by mouth as needed for mild pain (2 tablets once daily if needed).    Yes Historical Provider, MD  aspirin EC 81 MG tablet Take 1 tablet (81 mg total) by mouth daily. 11/09/15  Yes Liliane Shi, PA-C  calcitRIOL (ROCALTROL) 0.25 MCG capsule Take 0.25 mcg by mouth. Monday, Wed and Friday 12/27/14  Yes Historical Provider, MD  calcium carbonate (TUMS EX) 750 MG chewable tablet Chew 1 tablet by mouth 3 (three) times daily with meals.   Yes Historical Provider, MD  [address] 125 MCG tablet Take 1 tablet by mouth  every other day 12/07/15  Yes [name], MD  furosemide (LASIX) 80 MG tablet Take 1 tablet two times daily.  May take one extra tablet daily for symptoms or 3 pound weight gain. 06/25/15  Yes Sherren Mocha, MD  HUMALOG MIX 75/25 (75-25) 100 UNIT/ML SUSP injection Inject subcutaneously 26  units two times daily with  meals 11/27/15  Yes [name], MD  hydrALAZINE (APRESOLINE) 25 MG tablet Take 1 and 1/2 tablets by  mouth 3 times daily 12/14/15  Yes [name],  MD  isosorbide mononitrate (IMDUR) 30 MG 24 hr tablet Take 1 tablet by mouth  daily 12/13/15  Yes Sherren Mocha, MD  metoprolol succinate (TOPROL-XL) 100 MG 24 hr tablet Take 1 tablet by mouth  twice a day, with or  immediately following   meals 12/06/15  Yes Sherren Mocha, MD  multivitamin (RENA-VIT) TABS tablet Take 1 tablet by mouth daily.   Yes Historical Provider, MD  potassium chloride SA (K-DUR,KLOR-CON) 20 MEQ tablet Take 2 tablets (40 mEq total) by mouth daily. 06/06/15  Yes Erma Heritage, PA  warfarin (COUMADIN) 5 MG tablet Take 2.5 mg (one-half tablet) by mouth Mondays, and one whole tablet by mouth al other days (tue,  Wed, Thur, Fri, Sat, Sun) 12/10/15  Yes [name], MD   BP 164/80 mmHg  Pulse 76  Temp(Src) 98.1 F (36.7 C)  Resp 13  Ht 5\' 11"  (1.803 m)  Wt 175 lb (79.379 kg)  BMI 24.42 kg/m2  SpO2 92% Physical Exam  Constitutional: He is oriented to person, place, and time. He appears well-nourished.  HENT:  Head: Normocephalic.  Mouth/Throat: Oropharynx is clear and moist.  No JVD  Eyes: Conjunctivae are normal.  Neck: No tracheal deviation present.  Cardiovascular: Normal rate.   Pulmonary/Chest: Effort normal. No stridor. No respiratory distress.  Abdominal: Soft. There is no tenderness. There is no guarding.  Musculoskeletal: Normal range of motion.  + 1 edema bilateral LE  Neurological: He is oriented to person, place, and time. No cranial nerve deficit.  Skin: Skin is warm and dry. No rash noted. He is not diaphoretic.  Psychiatric: He has a normal mood and affect. His behavior is normal.  Nursing note and vitals reviewed.   ED Course  Procedures (including critical care time) Labs Review Labs Reviewed  BASIC METABOLIC PANEL - Abnormal; Notable for the following:    CO2 19 (*)    Glucose, Bld 200 (*)    BUN 78 (*)    Creatinine, Ser 5.23 (*)    GFR calc non Af Amer 9 (*)    GFR calc Af Amer 11 (*)    All other components within normal  limits  CBC - Abnormal; Notable for the following:    RBC 3.57 (*)    Hemoglobin 10.6 (*)    HCT 34.3 (*)    RDW 16.1 (*)    Platelets 116 (*)    All other components within normal limits  PROTIME-INR - Abnormal; Notable for the following:    Prothrombin Time 31.5 (*)    INR 3.11 (*)    All other components within normal limits  BRAIN NATRIURETIC PEPTIDE - Abnormal; Notable for the following:    B Natriuretic Peptide 981.0 (*)    All other components within normal limits  D-DIMER, QUANTITATIVE (NOT AT [facility]) - Abnormal; Notable for the following:    D-Dimer, Quant 3.24 (*)    All other components within normal limits  I-STAT TROPOININ, ED - Abnormal; Notable for the following:    Troponin i, poc 0.11 (*)    All other components within normal limits  PROCALCITONIN    Imaging Review Dg Chest 2 View  12/18/2015  CLINICAL DATA:  Acute onset of shortness of breath. Initial encounter. EXAM: CHEST  2 VIEW COMPARISON:  Chest radiograph from 06/04/2015 FINDINGS: The lungs are well-aerated. Mild bibasilar airspace opacities may reflect atelectasis or possibly mild pneumonia. Mild peribronchial thickening is noted. No pleural effusion or pneumothorax is seen. The heart is borderline enlarged. A pacemaker/AICD is noted overlying the left chest wall, with leads ending overlying the right ventricle and coronary sinus. No acute osseous abnormalities are seen. IMPRESSION: Mild bibasilar airspace opacities may reflect atelectasis or possibly mild pneumonia. Mild peribronchial thickening noted. Borderline cardiomegaly. Electronically Signed   By: Garald Balding M.D.   On: 12/18/2015 05:53   I have personally reviewed and evaluated these images and lab results as part of my medical decision-making.   EKG Interpretation   Date/Time:  Tuesday December 18 2015 04:55:39 EDT Ventricular Rate:  75 PR Interval:    QRS Duration: 186 QT Interval:  476 QTC Calculation: 532 R Axis:   -117 Text Interpretation:   Accelerated junctional  rhythm Nonspecific IVCD with  LAD VENTRICULAR PACED RHYTHM Confirmed by Thomasene Lot, [place] ([id number]) on  12/18/2015 5:29:06 AM Also confirmed by Gerald Leitz ([id number]), editor  [place]  CCT, [place] ([id number])  on 12/18/2015 7:41:53 AM      MDM   Final diagnoses:  None   Patient's age 6 year old very pleasant gentleman presenting with shortness of breath over the last 3 days. Patient denies any overt chest pain. He denies any fever nausea vomiting or diarrhea. Patient's very vigilant about his caring for his CHF. Patient has elevated troponin today of 0.11. EKG is nonischemic. Given the patient has no overt findings of congestive heart failure including no edema crackles no hypoxia and concerned about other etiology for this shortness of breath and elevated troponin.  D-dimer ordered and was elevated. However patient does not have a creatinine function good enough for CT angio at this time. patient is hemodynamically stable and therefore I will defer to inpatient team whether to get a VQ scan. Chest x-ray shows questionable new pneumonia. However patient's white count is 6 and he's had no symptoms such as fever. I will defer to inpatient team about whether to start antibiotics.  Will admit.    Rachele Lamaster Julio Alm, MD 12/18/15 [place], MD 12/18/15 [phone]

## 2015-12-18 NOTE — ED Notes (Signed)
Lab tech to add on BNP 

## 2015-12-18 NOTE — Progress Notes (Signed)
SATURATION QUALIFICATIONS: (This note is used to comply with regulatory documentation for home oxygen)  Patient Saturations on Room Air at Rest = 96%  Patient Saturations on Room Air while Ambulating = 95%  Patient Saturations on  Liters of oxygen while Ambulating = 90%  Please briefly explain why patient needs home oxygen:  Pt does not qualify for home O2, as Oxygen was not necessary to keep sats above 88%.

## 2015-12-18 NOTE — ED Notes (Signed)
CT aware pt ready for scan

## 2015-12-18 NOTE — Progress Notes (Signed)
ANTICOAGULATION CONSULT NOTE - Initial Consult  Pharmacy Consult for Coumadin Indication: atrial fibrillation  Allergies  Allergen Reactions  . Ace Inhibitors Other (See Comments)    Stopped by Dr. Burt Knack due to progressive renal failure  . Prednisone Other (See Comments)    Raised blood sugar to almost 900    Patient Measurements: Height: 5\' 11"  (180.3 cm) Weight: 175 lb (79.379 kg) IBW/kg (Calculated) : 75.3  Vital Signs: Temp: 98.1 F (36.7 C) (03/28 0454) BP: 172/71 mmHg (03/28 0844) Pulse Rate: 60 (03/28 0844)  Labs:  Recent Labs  12/18/15 0503  HGB 10.6*  HCT 34.3*  PLT 116*  LABPROT 31.5*  INR 3.11*  CREATININE 5.23*    Estimated Creatinine Clearance: 11.6 mL/min (by C-G formula based on Cr of 5.23).   Medical History: Past Medical History  Diagnosis Date  . Chronic atrial fibrillation ([place])     a. Historically difficult rate control. b. s/p CRT-D implantation with anticipation of AV junction ablation but patient then was able to accomplish rate control and ablation not undertaken.; AVN ablation 08/2013 by Dr Lovena Le  . CARDIOMYOPATHY, PRIMARY, DILATED     a. Mixed ICM/NICM - EF 15% by echo 05/2009; St. Jude CRT-D implanted 06/2009. b. Echo 04/2013 - EF 20%, mild LVH.  [name] Chronic systolic CHF (congestive heart failure) ([place])   . CORONARY ARTERY DISEASE     a. BMS to LAD 04/2008. b. Cath 06/2009: nonobstructive disease. c. cath 05/16/2015 DES to prox LAD  . SLEEP APNEA   . Hypertension   . Hyperlipidemia   . Diabetes mellitus   . Osteoarthritis   . Tubular adenoma of colon   . Secondary hyperparathyroidism ([place])   . CKD (chronic kidney disease), stage IV ([employer])     a. Right brachiocephalic AV fistula [id number]. Neph = [place].  . Vertigo   . Valvular heart disease     a. Echo 04/2013: mild AI, mild MR.  . Pulmonary HTN ([place])     a. PA pressure 56mmHg by echo 05/01/13.  . Prostate cancer [facility])     a. s/p radical prostatectomy.  . Colon cancer ([place])   . Skin  cancer   . Presence of permanent cardiac pacemaker     Assessment: 82 yo M presents on 3/28 with SOB. Has a hx of Afib, CBA, and CHF. On Coumadin 5mg  daily exc for 2.5mg  on Mon. Last dose was taken 3/27. INR on admit today is 3.11. Hgb 10.6, plts 116. No s/s of bleed.  Goal of Therapy:  INR 2-3 Monitor platelets by anticoagulation protocol: Yes   Plan:  Hold coumadin tonight Monitor daily INR, CBC, s/s of bleed  Elenor Quinones, PharmD, [facility] Clinical Pharmacist Pager [phone] 12/18/2015 8:48 AM

## 2015-12-18 NOTE — H&P (Signed)
[facility] History and Physical                                                                                    [name] [name], is a 82 y.o. male  MRN: [id number]   DOB - Feb 08, 1931  Admit Date - 12/18/2015  Outpatient Primary MD for the patient is Nyoka Cowden, MD  Referring MD: Thomasene Lot / ER  PMH: Past Medical History  Diagnosis Date  . Chronic atrial fibrillation ([place])     a. Historically difficult rate control. b. s/p CRT-D implantation with anticipation of AV junction ablation but patient then was able to accomplish rate control and ablation not undertaken.; AVN ablation 08/2013 by Dr Lovena Le  . CARDIOMYOPATHY, PRIMARY, DILATED     a. Mixed ICM/NICM - EF 15% by echo 05/2009; St. Jude CRT-D implanted 06/2009. b. Echo 04/2013 - EF 20%, mild LVH.  [name] Chronic systolic CHF (congestive heart failure) ([place])   . CORONARY ARTERY DISEASE     a. BMS to LAD 04/2008. b. Cath 06/2009: nonobstructive disease. c. cath 05/16/2015 DES to prox LAD  . SLEEP APNEA   . Hypertension   . Hyperlipidemia   . Diabetes mellitus   . Osteoarthritis   . Tubular adenoma of colon   . Secondary hyperparathyroidism ([place])   . CKD (chronic kidney disease), stage IV ([place])     a. Right brachiocephalic AV fistula [id number]. Neph = [place].  . Vertigo   . Valvular heart disease     a. Echo 04/2013: mild AI, mild MR.  . Pulmonary HTN ([place])     a. PA pressure 33mmHg by echo 05/01/13.  . Prostate cancer [facility])     a. s/p radical prostatectomy.  . Colon cancer ([place])   . Skin cancer   . Presence of permanent cardiac pacemaker       PSH: Past Surgical History  Procedure Laterality Date  . Colectomy    . Prostatectomy    . Penile prosthesis placement    . Removed prosthetic eye    . Ptca      stent placed  . Total knee arthroplasty  2008    right  . Pacemaker insertion  2010  . Av fistula placement  03/30/2012    Procedure: ARTERIOVENOUS (AV) FISTULA CREATION;  Surgeon: Angelia Mould, MD;   Location: [place];  Service: Vascular;  Laterality: Right;  Creation of BrachioCephalic Fistula Right arm  . Eye surgery      left eye prothesis  . Fracture surgery      collar bone, right knee replacement  . Bi-ventricular implantable cardioverter defibrillator  (crt-d)  2010    STJ CRTD implanted by Dr [name]  . Ablation  08/2013    AVN ablation by Dr Lovena Le  . Av node ablation N/A 09/12/2013    Procedure: AV NODE ABLATION;  Surgeon: [name], MD;  Location: [facility] CATH LAB;  Service: Cardiovascular;  Laterality: N/A;  . Insert / replace / remove pacemaker    . Joint replacement    . Cardiac catheterization N/A 05/09/2015    Procedure: Left Heart Cath and Coronary Angiography;  Surgeon: Sherren Mocha, MD;  Location: [place] CV LAB;  Service: Cardiovascular;  Laterality: N/A;  . Cardiac catheterization N/A 05/16/2015    Procedure: Coronary Stent Intervention;  Surgeon: Sherren Mocha, MD;  Location: [place] CV LAB;  Service: Cardiovascular;  Laterality: N/A;     CC:  Chief Complaint  Patient presents with  . 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 Patient presented to the ER with progressive shortness of breath over 2 days manifested as paroxysmal nocturnal dyspnea, orthopnea and over the past 24 hours dyspnea on exertion. Patient reports he's having difficulty walking 15 feet from his bedroom to the bathroom. He's had a dry nonproductive cough which is typical for her exacerbations. He has had no fever or other constitutional symptoms. He has had some increased nasal congestion that typically responds to intranasal Vicks and  application of CPAP but has not on this occasion. He reports he saw his primary care physician last week and has not been started on any new medications, his hemoglobin A1c was 7.1, he has not had any dietary indiscretion, he has not noticed any weight gain or lower extremity edema.  ER Evaluation and treatment: Afebrile-BP 166/78, pulse 76 respirations 22-room air saturations at rest 95% EKG: 100% ventricular paced rhythm, ventricular rate 75 bpm, QTC 532 ms 2 View CXR: Increased vascular markings without obvious infiltrate or effusion Lab data: Na 142, K 4.6, CO2 19, BUN 78, creatinine 5.23, glucose 200, BNP 981, troponin 0.11, WBC 6000, hemoglobin 10.6, platelets 116,000, d-dimer 3.24, INR 3.15   Review of Systems   In addition to the HPI above,  No Fever-chills, myalgias or other constitutional symptoms No Headache, changes with Vision or hearing, new weakness, tingling, numbness in any extremity, No problems swallowing food or Liquids, indigestion/reflux No Chest pain, palpitations No Abdominal pain, N/V; no melena or hematochezia, no dark tarry stools No dysuria, hematuria or flank pain No new skin rashes, lesions, masses or bruises, No new joints pains-aches No recent weight gain or loss No polyuria, polydypsia or polyphagia,  *A full 10 point Review of Systems was done, except as stated above, all other Review of Systems were negative.  Social History Social History  Substance Use Topics  . Smoking status: Former Smoker    Types: Cigarettes    Quit date: 09/22/1966  . Smokeless tobacco: Never Used  . Alcohol Use: 0.0 - 0.6 oz/week    0-1 Standard drinks or equivalent per week     Comment: 05/07/2015 "might have a couple drinks/month"    Resides at: Private residence  Lives with: [place]  Ambulatory status: Without assistive devices   Family History Family History  Problem Relation Age of Onset  . Heart disease Father   . Throat cancer Father   . Throat  cancer Mother   . Heart disease Mother   . Hypertension Mother      Prior to Admission medications   Medication Sig Start Date End Date Taking? Authorizing Provider  acetaminophen (TYLENOL) 500 MG tablet Take 500 mg by mouth as needed for mild pain (2 tablets once daily if needed).    Yes Historical Provider, MD  aspirin EC 81 MG tablet Take 1 tablet (81 mg total) by  mouth daily. 11/09/15  Yes Liliane Shi, PA-C  calcitRIOL (ROCALTROL) 0.25 MCG capsule Take 0.25 mcg by mouth. Monday, Wed and Friday 12/27/14  Yes Historical Provider, MD  calcium carbonate (TUMS EX) 750 MG chewable tablet Chew 1 tablet by mouth 3 (three) times daily with meals.   Yes Historical Provider, MD  [place] 125 MCG tablet Take 1 tablet by mouth  every other day 12/07/15  Yes [name], MD  furosemide (LASIX) 80 MG tablet Take 1 tablet two times daily.  May take one extra tablet daily for symptoms or 3 pound weight gain. 06/25/15  Yes Sherren Mocha, MD  HUMALOG MIX 75/25 (75-25) 100 UNIT/ML SUSP injection Inject subcutaneously 26  units two times daily with  meals 11/27/15  Yes [name], MD  hydrALAZINE (APRESOLINE) 25 MG tablet Take 1 and 1/2 tablets by  mouth 3 times daily 12/14/15  Yes [name], MD  isosorbide mononitrate (IMDUR) 30 MG 24 hr tablet Take 1 tablet by mouth  daily 12/13/15  Yes Sherren Mocha, MD  metoprolol succinate (TOPROL-XL) 100 MG 24 hr tablet Take 1 tablet by mouth  twice a day, with or  immediately following   meals 12/06/15  Yes Sherren Mocha, MD  multivitamin (RENA-VIT) TABS tablet Take 1 tablet by mouth daily.   Yes Historical Provider, MD  potassium chloride SA (K-DUR,KLOR-CON) 20 MEQ tablet Take 2 tablets (40 mEq total) by mouth daily. 06/06/15  Yes Erma Heritage, PA  warfarin (COUMADIN) 5 MG tablet Take 2.5 mg (one-half tablet) by mouth Mondays, and one whole tablet by mouth al other days (tue, Wed, Thur, Fri, Sat, Sun) 12/10/15  Yes [name], MD     Allergies  Allergen Reactions  . Ace Inhibitors Other (See Comments)    Stopped by Dr. Burt Knack due to progressive renal failure  . Prednisone Other (See Comments)    Raised blood sugar to almost 900    Physical Exam  Vitals  Blood pressure 172/71, pulse 60, temperature 98.1 F (36.7 C), resp. rate 15, height 5\' 11"  (1.803 m), weight 175 lb (79.379 kg), SpO2 95 %.   General:  In no acute distress, appears healthy and well nourished  Psych:  Normal affect, Denies Suicidal or Homicidal ideations, Awake Alert, Oriented X 3. Speech and thought patterns are clear and appropriate, no apparent short term memory deficits  Neuro:   No focal neurological deficits, CN II through XII intact, Strength 5/5 all 4 extremities, Sensation intact all 4 extremities.  ENT:  Ears and Eyes appear Normal, Conjunctivae clear, PER. Moist oral mucosa without erythema or exudates.  Neck:  Supple, No lymphadenopathy appreciated  Respiratory:  Symmetrical chest wall movement, Good air movement bilaterally, bilateral crackles greater on the right side. Nasal cannula oxygen at 2 L  Cardiac:  RRR, No Murmurs, no LE edema noted, no JVD, No carotid bruits, peripheral pulses palpable at 2+  Abdomen:  Positive bowel sounds, Soft, Non tender, Non distended,  No masses appreciated, no obvious hepatosplenomegaly  Skin:  No Cyanosis, Normal Skin Turgor, No Skin Rash or Bruise.  Extremities: Symmetrical without obvious trauma or injury,  no effusions.  Data Review  CBC  Recent Labs Lab 12/18/15 0503  WBC 6.0  HGB 10.6*  HCT 34.3*  PLT 116*  MCV 96.1  MCH 29.7  MCHC 30.9  RDW 16.1*    Chemistries   Recent Labs Lab 12/14/15 1039 12/18/15 0503  NA 142 142  K 4.2 4.6  CL 102 110  CO2 27 19*  GLUCOSE 207* 200*  BUN 78* 78*  CREATININE 4.92* 5.23*  CALCIUM 9.8 9.2    estimated creatinine clearance is 11.6 mL/min (by C-G formula based on Cr of 5.23).  No results for input(s): TSH, T4TOTAL,  T3FREE, THYROIDAB in the last 72 hours.  Invalid input(s): FREET3  Coagulation profile  Recent Labs Lab 12/18/15 0503  INR 3.11*     Recent Labs  12/18/15 0503  DDIMER 3.24*    Cardiac Enzymes No results for input(s): CKMB, TROPONINI, MYOGLOBIN in the last 168 hours.  Invalid input(s): CK  Invalid input(s): POCBNP  Urinalysis    Component Value Date/Time   COLORURINE YELLOW 02/16/2014 2229   APPEARANCEUR CLEAR 02/16/2014 2229   LABSPEC 1.017 02/16/2014 2229   PHURINE 5.5 02/16/2014 2229   GLUCOSEU 100* 02/16/2014 2229   HGBUR NEGATIVE 02/16/2014 2229   HGBUR 1+ 08/25/2007 0809   BILIRUBINUR NEGATIVE 02/16/2014 2229   BILIRUBINUR small 08/08/2013 1132   KETONESUR NEGATIVE 02/16/2014 2229   KETONESUR negative 08/08/2013 1132   PROTEINUR 100* 02/16/2014 2229   UROBILINOGEN 0.2 02/16/2014 2229   UROBILINOGEN 0.2 08/08/2013 1132   NITRITE NEGATIVE 02/16/2014 2229   NITRITE Negative 08/08/2013 1132   LEUKOCYTESUR NEGATIVE 02/16/2014 2229    Imaging results:   Dg Chest 2 View  12/18/2015  CLINICAL DATA:  Acute onset of shortness of breath. Initial encounter. EXAM: CHEST  2 VIEW COMPARISON:  Chest radiograph from 06/04/2015 FINDINGS: The lungs are well-aerated. Mild bibasilar airspace opacities may reflect atelectasis or possibly mild pneumonia. Mild peribronchial thickening is noted. No pleural effusion or pneumothorax is seen. The heart is borderline enlarged. A pacemaker/AICD is noted overlying the left chest wall, with leads ending overlying the right ventricle and coronary sinus. No acute osseous abnormalities are seen. IMPRESSION: Mild bibasilar airspace opacities may reflect atelectasis or possibly mild pneumonia. Mild peribronchial thickening noted. Borderline cardiomegaly. Electronically Signed   By: Garald Balding M.D.   On: 12/18/2015 05:53     EKG: (Independently reviewed)  100% ventricular paced rhythm, ventricular rate 75 bpm, QTC 532 ms   Assessment &  Plan  Principal Problem:   Acute respiratory distress /Acute on chronic systolic heart failure, NYHA class 2/ Dilated cardiomyopathy  -Patient with acute onset of symptoms over 48 hours manifested with PND, orthopnea and dyspnea on exertion without weight gain and appears to be related to heart failure -Telemetry/Obs -Lasix 80 mg IV every 12 hours -Daily weights and strict I/O -BNP around 900 -Continue digoxin but in setting of stage IV chronic kidney disease check level -Continue nitrates and other afterload reducing agent i.e. Hydralazine -No ACE inhibitor secondary to chronic kidney disease -Beta blocker on hold secondary to acute exacerbation -Last echocardiogram was in 2016 with an EF of 25-30%, pulmonary hypertension 48 mmHg and mild MR therefore no indication to repeat this admission  Active Problems:   CAD /Elevated troponin -Patient without chest pain or other typical symptoms for CAD -Suspect related to chronic kidney disease therefore no indication to cycle    Chronic atrial fibrillation / BIVentricular Defibrillator--St Jude Currently 100% ventricular paced -CHADVASc = 6 -Warfarin per pharmacy    Chronic renal insufficiency, stage IV (severe) -Renal function stable and at baseline (BUN 78 and creatinine 4.92) -Has AV fistula right upper extremity    Hyperlipidemia -Not on statin    Essential hypertension -Blood pressure controlled -Continue diuretics, Imdur and hydralazine; beta blocker on hold as above    Diabetes  mellitus with renal complications  -Recent outpatient hemoglobin A1c 7.1; would avoid overcorrection in patient with chronic kidney disease -Continue mixed insulin noting therapeutic substitution to NovoLog 70/30 from home dosing of 70/25    Positive D dimer -No history or clinical exam findings concerning for possible PE -Suspect elevated d-dimer primarily related to chronic kidney disease -Patient on Coumadin chronically for atrial fibrillation and  INR therapeutic at 3.15    DVT Prophylaxis: Warfarin  Family Communication:   No family at bedside  Code Status:  DO NOT RESUSCITATE  Condition:  Stable  Discharge disposition:Anticipate discharge back to previous home environment once heart failure compensated  Time spent in minutes : 60      ELLIS,ALLISON L. ANP on 12/18/2015 at 8:55 AM  You may contact me by going to [email or web address] - password [email or web address]  I am available from 7a-7p but please confirm I am on the schedule by going to Amion as above.   After 7p please contact night coverage person covering me after hours  [facility]

## 2015-12-18 NOTE — ED Notes (Signed)
Discussed plan of care with pt with Dr. Marily Memos and will update pt and family members.

## 2015-12-18 NOTE — ED Notes (Signed)
Pt to ED from home c/o SOB x 2 days. Hx of CHF ,denies weight gain. SOB woke pt from sleep tonight and is worsened on exertion. Pt reports working out at [employer] normally, but was unable to yesterday because of SOB. Pacer noted to L chest. Also has fistula to R AC, has not started dialysis yet

## 2015-12-19 ENCOUNTER — Telehealth: Payer: Self-pay | Admitting: *Deleted

## 2015-12-19 DIAGNOSIS — I130 Hypertensive heart and chronic kidney disease with heart failure and stage 1 through stage 4 chronic kidney disease, or unspecified chronic kidney disease: Secondary | ICD-10-CM | POA: Diagnosis not present

## 2015-12-19 DIAGNOSIS — I5023 Acute on chronic systolic (congestive) heart failure: Secondary | ICD-10-CM | POA: Diagnosis not present

## 2015-12-19 LAB — BASIC METABOLIC PANEL
ANION GAP: 11 (ref 5–15)
BUN: 74 mg/dL — AB (ref 6–20)
CALCIUM: 9.4 mg/dL (ref 8.9–10.3)
CO2: 23 mmol/L (ref 22–32)
Chloride: 108 mmol/L (ref 101–111)
Creatinine, Ser: 5.13 mg/dL — ABNORMAL HIGH (ref 0.61–1.24)
GFR calc Af Amer: 11 mL/min — ABNORMAL LOW (ref >60)
GFR, EST NON AFRICAN AMERICAN: 9 mL/min — AB (ref >60)
GLUCOSE: 155 mg/dL — AB (ref 65–99)
POTASSIUM: 3.4 mmol/L — AB (ref 3.5–5.1)
Sodium: 142 mmol/L (ref 135–145)

## 2015-12-19 LAB — PROTIME-INR
INR: 2.69 — ABNORMAL HIGH (ref 0.00–1.49)
PROTHROMBIN TIME: 28.2 s — AB (ref 11.6–15.2)

## 2015-12-19 LAB — CBC
HCT: 32.6 % — ABNORMAL LOW (ref 39.0–52.0)
Hemoglobin: 10.6 g/dL — ABNORMAL LOW (ref 13.0–17.0)
MCH: 30.6 pg (ref 26.0–34.0)
MCHC: 32.5 g/dL (ref 30.0–36.0)
MCV: 94.2 fL (ref 78.0–100.0)
PLATELETS: 108 10*3/uL — AB (ref 150–400)
RBC: 3.46 MIL/uL — ABNORMAL LOW (ref 4.22–5.81)
RDW: 15.8 % — AB (ref 11.5–15.5)
WBC: 6.4 10*3/uL (ref 4.0–10.5)

## 2015-12-19 LAB — GLUCOSE, CAPILLARY: GLUCOSE-CAPILLARY: 127 mg/dL — AB (ref 65–99)

## 2015-12-19 LAB — HEMOGLOBIN A1C
HEMOGLOBIN A1C: 7.3 % — AB (ref 4.8–5.6)
Mean Plasma Glucose: 163 mg/dL

## 2015-12-19 MED ORDER — WARFARIN SODIUM 2.5 MG PO TABS
2.5000 mg | ORAL_TABLET | Freq: Once | ORAL | Status: AC
  Administered 2015-12-19: 2.5 mg via ORAL
  Filled 2015-12-19: qty 1

## 2015-12-19 MED ORDER — FUROSEMIDE 10 MG/ML IJ SOLN
40.0000 mg | Freq: Once | INTRAMUSCULAR | Status: DC
  Filled 2015-12-19: qty 4

## 2015-12-19 MED ORDER — FUROSEMIDE 10 MG/ML IJ SOLN
80.0000 mg | Freq: Once | INTRAMUSCULAR | Status: AC
  Administered 2015-12-19: 80 mg via INTRAVENOUS
  Filled 2015-12-19: qty 8

## 2015-12-19 NOTE — Care Management Obs Status (Signed)
[facility] NOTIFICATION   Patient Details  Name: [name] [name]. MRN: [id number] Date of Birth: [birth year]   Medicare Observation Status Notification Given:  Yes    Royston Bake, RN 12/19/2015, 11:11 AM

## 2015-12-19 NOTE — Progress Notes (Signed)
Pt very agitated r/t he states that he has not spoken to a MD in 24 hours.  I assured him that his attending physician will be here to speak to him as he is rounding on other pts currently.  He refuses Novolog 1unit, refuses foley catheter, refuses lasix.

## 2015-12-19 NOTE — Telephone Encounter (Signed)
Patient was discharged from the hospital 12/19/15 and was seen by [name] [name] - [place] while in the hospital.  He has a hospital follow up 12/25/15.  Would you like this to be a TCM appointment?

## 2015-12-19 NOTE — Discharge Summary (Signed)
Physician Discharge Summary  [name] [name]. [id number] DOB: Mar 25, 1931 DOA: 12/18/2015  PCP: Nyoka Cowden, MD  Admit date: 12/18/2015 Discharge date: 12/19/2015  Time spent: 35 minutes  Recommendations for Outpatient Follow-up:  1. Needs labs on 12/21/15 as below 2. No acute changes to medications 3. Patient does not have an acute need for dialysis but is close to needing dialysis-this is to be addressed with his nephrologist as an outpatient on 12/24/15 4. See discharge summary   Discharge Diagnoses:  Principal Problem:   Acute on chronic systolic heart failure, NYHA class 2 (HCC) Active Problems:   Hyperlipidemia   Essential hypertension   CAD (coronary artery disease)   Dilated cardiomyopathy (HCC)   Chronic atrial fibrillation (H[facility])   BIVentricular Defibrillator--St Jude   Chronic renal insufficiency, stage IV (severe)   Diabetes mellitus with renal complications (HCC)   Elevated troponin   Acute respiratory distress ([facility])   Acute on chronic systolic and diastolic heart failure, NYHA class 2 (HCC)   Positive D dimer   Discharge Condition: good  Diet recommendation: renal  Filed Weights   12/18/15 0454 12/18/15 1056 12/19/15 0533  Weight: 79.379 kg (175 lb) 79.107 kg (174 lb 6.4 oz) 73.211 kg (161 lb 6.4 oz)    History of present illness:   Brief narrative: 82 y/o ? History atrial fibrillation, [place] score = greater than 3, biventricular PPM Obstructive sleep apnea, on BiPAP at night Hypertension Hyperlipidemia Chronic kidney disease Prostate cancer Severe systolic heart failure + dilated cardiomyopathy based on echo 12/06/14 EF 25-30%, PA SP 48 mm Probable cardiorenal syndrome with baseline creatinine 4  Admitted via emergency room with primary symptom of shortness of breath on 12/18/15 -CXR = increased vascular markings, BNP 981, d-dimer 3.2, INR 3.1 and O2 sats at rest 95% QTC was 532   Patient was admitted with mild volume overload in the  setting of heart failure as well as obstructive sleep apnea. He does have systolic heart failure with severe decreased EF 25-30% based on echocardiogram earlier this month 12/06/14. He is religiously taking his Lasix and is very conversant and how to take an extra dose of 80 mg if he gains more than 1-2 pounds in a 24-36 hour period of time.  On day of discharge 3/29 he was completely chest pain, shortness of breath free and was ambulating in the hallway without any significant arrhythmias noted on the monitor.  I did discuss with him options and patient's preference is to keep his close follow-up appointment which is already scheduled with Dr. Lorrene Reid on 12/24/15-we have set up labs to be done on him on 12/21/15 as he probably has cardiorenal syndrome which contributes to his renal insufficiency and he had a slight drop in his GFR from 11-9 this admission.  Not changed any of his medications this admission and patient will get once again CBC, renal panel, INR on 12/21/15  I explained clearly to him the plan of care and he understands to follow up with nephrology as per our discussion He is upset this morning that his Lasix has been held   Discharge Exam: Filed Vitals:   12/18/15 2043 12/19/15 0533  BP: 149/66 153/69  Pulse: 59 77  Temp: 98.2 F (36.8 C) 98 F (36.7 C)  Resp: 16 18   Alert pleasant oriented no apparent distress Not short of breath no chest pain No lower extremity swelling No fever no chills No nausea no vomiting General: EOMI NCAT, no JVD, no bruit Cardiovascular: S1-S2  no murmur or gallop, pacemaker in situ  Respiratory: Slight crackles to bases bilaterally however no rales no rhonchi  Discharge Instructions   Discharge Instructions    Diet - low sodium heart healthy    Complete by:  As directed      Discharge instructions    Complete by:  As directed   Follow-up with Dr. Lorrene Reid as per appt. Below Continue all your medications without change. Make sure you adhere  to your instructions for dialysis Get renal panel, cbc and INR  this week Follow up with your PCP     Increase activity slowly    Complete by:  As directed           Current Discharge Medication List    CONTINUE these medications which have NOT CHANGED   Details  acetaminophen (TYLENOL) 500 MG tablet Take 500 mg by mouth as needed for mild pain (2 tablets once daily if needed).     aspirin EC 81 MG tablet Take 1 tablet (81 mg total) by mouth daily. Qty: 90 tablet, Refills: 3    calcitRIOL (ROCALTROL) 0.25 MCG capsule Take 0.25 mcg by mouth. Monday, Wed and Friday    calcium carbonate (TUMS EX) 750 MG chewable tablet Chew 1 tablet by mouth 3 (three) times daily with meals.    DIGOX 125 MCG tablet Take 1 tablet by mouth  every other day Qty: 45 tablet, Refills: 1    furosemide (LASIX) 80 MG tablet Take 1 tablet two times daily.  May take one extra tablet daily for symptoms or 3 pound weight gain. Qty: 210 tablet, Refills: 2    HUMALOG MIX 75/25 (75-25) 100 UNIT/ML SUSP injection Inject subcutaneously 26  units two times daily with  meals Qty: 5 vial, Refills: 3    hydrALAZINE (APRESOLINE) 25 MG tablet Take 1 and 1/2 tablets by  mouth 3 times daily Qty: 405 tablet, Refills: 1    isosorbide mononitrate (IMDUR) 30 MG 24 hr tablet Take 1 tablet by mouth  daily Qty: 90 tablet, Refills: 1    metoprolol succinate (TOPROL-XL) 100 MG 24 hr tablet Take 1 tablet by mouth  twice a day, with or  immediately following   meals Qty: 180 tablet, Refills: 2    multivitamin (RENA-VIT) TABS tablet Take 1 tablet by mouth daily.    potassium chloride SA (K-DUR,KLOR-CON) 20 MEQ tablet Take 2 tablets (40 mEq total) by mouth daily. Qty: 60 tablet, Refills: 3    warfarin (COUMADIN) 5 MG tablet Take 2.5 mg (one-half tablet) by mouth Mondays, and one whole tablet by mouth al other days (tue, Wed, Thur, Fri, Sat, Sun) Qty: 90 tablet, Refills: 1       Allergies  Allergen Reactions  . Ace  Inhibitors Other (See Comments)    Stopped by Dr. Burt Knack due to progressive renal failure  . Prednisone Other (See Comments)    Raised blood sugar to almost 900   Follow-up Information    Follow up with Nyoka Cowden, MD.   Specialty:  Internal Medicine   Contact information:   [place] [id number] [phone]       Follow up with Lucrezia Starch, MD On 12/24/2015.   Specialty:  Nephrology   Why:  If symptoms worsen-office will call for labwork   Contact information:   [place] [address] [phone]        The results of significant diagnostics from this hospitalization (including imaging, microbiology, ancillary and laboratory)  are listed below for reference.    Significant Diagnostic Studies: Dg Chest 2 View  12/18/2015  CLINICAL DATA:  Acute onset of shortness of breath. Initial encounter. EXAM: CHEST  2 VIEW COMPARISON:  Chest radiograph from 06/04/2015 FINDINGS: The lungs are well-aerated. Mild bibasilar airspace opacities may reflect atelectasis or possibly mild pneumonia. Mild peribronchial thickening is noted. No pleural effusion or pneumothorax is seen. The heart is borderline enlarged. A pacemaker/AICD is noted overlying the left chest wall, with leads ending overlying the right ventricle and coronary sinus. No acute osseous abnormalities are seen. IMPRESSION: Mild bibasilar airspace opacities may reflect atelectasis or possibly mild pneumonia. Mild peribronchial thickening noted. Borderline cardiomegaly. Electronically Signed   By: Garald Balding M.D.   On: 12/18/2015 05:53    Microbiology: No results found for this or any previous visit (from the past 240 hour(s)).   Labs: Basic Metabolic Panel:  Recent Labs Lab 12/14/15 1039 12/18/15 0503 12/18/15 0934 12/19/15 0237  NA 142 142  --  142  K 4.2 4.6  --  3.4*  CL 102 110  --  108  CO2 27 19*  --  23  GLUCOSE 207* 200*  --  155*  BUN 78* 78*  --  74*   CREATININE 4.92* 5.23*  --  5.13*  CALCIUM 9.8 9.2  --  9.4  MG  --   --  2.4  --    Liver Function Tests: No results for input(s): AST, ALT, ALKPHOS, BILITOT, PROT, ALBUMIN in the last 168 hours. No results for input(s): LIPASE, AMYLASE in the last 168 hours. No results for input(s): AMMONIA in the last 168 hours. CBC:  Recent Labs Lab 12/18/15 0503 12/19/15 0237  WBC 6.0 6.4  HGB 10.6* 10.6*  HCT 34.3* 32.6*  MCV 96.1 94.2  PLT 116* 108*   Cardiac Enzymes: No results for input(s): CKTOTAL, CKMB, CKMBINDEX, TROPONINI in the last 168 hours. BNP: BNP (last 3 results)  Recent Labs  06/04/15 1537 06/04/15 1904 12/18/15 0503  BNP 1099.9* 1655.4* 981.0*    ProBNP (last 3 results) No results for input(s): PROBNP in the last 8760 hours.  CBG:  Recent Labs Lab 12/18/15 1119 12/18/15 1641 12/18/15 2135 12/18/15 2242 12/19/15 0617  GLUCAP 213* 121* 65 169* 127*       Signed:  Nita Sells MD   [facility] 12/19/2015, 10:05 AM

## 2015-12-19 NOTE — Discharge Instructions (Signed)
Euglobulin Lysis Time Test WHY AM I HAVING THIS TEST? This test provides a measure of certain clotting factor activity in the blood. This helps your health care provider to determine how quickly a blood clot will break up and dissolve. WHAT KIND OF SAMPLE IS TAKEN? A blood sample is required for this test. It is usually collected by inserting a needle into a vein. HOW DO I PREPARE FOR THE TEST? There is no preparation required for this test. Your health care provider may instruct you to avoid strenuous physical activity for one hour before the test.  WHAT ARE THE REFERENCE RANGES? Reference ranges are considered healthy ranges established after testing a large group of healthy people. Reference ranges may vary among different people, labs, and hospitals. It is your responsibility to obtain your test results. Ask the lab or department performing the test when and how you will get your results.  Reference ranges are as follows:  Clot breakdown (lysis) in 1.5-4 hours. WHAT DO THE RESULTS MEAN? Shortened lysis time of less than one hour may indicate excessive danger of bleeding. Other reasons for a shortened lysis time include:  Disseminated intravascular coagulation (DIC).  Certain surgeries.  Certain medications. An increased lysis time may be associated with:  Type 2 diabetes in women.  Dialysis. Talk with your health care provider to discuss your results, treatment options, and if necessary, the need for more tests. Talk with your health care provider if you have any questions about your results.   This information is not intended to replace advice given to you by your health care provider. Make sure you discuss any questions you have with your health care provider.   Document Released: 10/01/2004 Document Revised: 09/29/2014 Document Reviewed: 03/01/2014 Elsevier Interactive Patient Education 2016 [place] on my medicine - Coumadin   (Warfarin)  This medication  education was reviewed with me or my healthcare representative as part of my discharge preparation.   Why was Coumadin prescribed for you? Coumadin was prescribed for you because you have a blood clot or a medical condition that can cause an increased risk of forming blood clots. Blood clots can cause serious health problems by blocking the flow of blood to the heart, lung, or brain. Coumadin can prevent harmful blood clots from forming. As a reminder your indication for Coumadin is:   Stroke Prevention Because Of Atrial Fibrillation  What test will check on my response to Coumadin? While on Coumadin (warfarin) you will need to have an INR test regularly to ensure that your dose is keeping you in the desired range. The INR (international normalized ratio) number is calculated from the result of the laboratory test called prothrombin time (PT).  If an INR APPOINTMENT HAS NOT ALREADY BEEN MADE FOR YOU please schedule an appointment to have this lab work done by your health care provider within 7 days. Your INR goal is usually a number between:  2 to 3 or your provider may give you a more narrow range like 2-2.5.  Ask your health care provider during an office visit what your goal INR is.  What  do you need to  know  About  COUMADIN? Take Coumadin (warfarin) exactly as prescribed by your healthcare provider about the same time each day.  DO NOT stop taking without talking to the doctor who prescribed the medication.  Stopping without other blood clot prevention medication to take the place of Coumadin may increase your risk of developing a new clot or  stroke.  Get refills before you run out.  What do you do if you miss a dose? If you miss a dose, take it as soon as you remember on the same day then continue your regularly scheduled regimen the next day.  Do not take two doses of Coumadin at the same time.  Important Safety Information A possible side effect of Coumadin (Warfarin) is an increased risk  of bleeding. You should call your healthcare provider right away if you experience any of the following: ? Bleeding from an injury or your nose that does not stop. ? Unusual colored urine (red or dark brown) or unusual colored stools (red or black). ? Unusual bruising for unknown reasons. ? A serious fall or if you hit your head (even if there is no bleeding).  Some foods or medicines interact with Coumadin (warfarin) and might alter your response to warfarin. To help avoid this: ? Eat a balanced diet, maintaining a consistent amount of Vitamin K. ? Notify your provider about major diet changes you plan to make. ? Avoid alcohol or limit your intake to 1 drink for women and 2 drinks for men per day. (1 drink is 5 oz. wine, 12 oz. beer, or 1.5 oz. liquor.)  Make sure that ANY health care provider who prescribes medication for you knows that you are taking Coumadin (warfarin).  Also make sure the healthcare provider who is monitoring your Coumadin knows when you have started a new medication including herbals and non-prescription products.  Coumadin (Warfarin)  Major Drug Interactions  Increased Warfarin Effect Decreased Warfarin Effect  Alcohol (large quantities) Antibiotics (esp. Septra/Bactrim, Flagyl, Cipro) Amiodarone (Cordarone) Aspirin (ASA) Cimetidine (Tagamet) Megestrol (Megace) NSAIDs (ibuprofen, naproxen, etc.) Piroxicam (Feldene) Propafenone (Rythmol SR) Propranolol (Inderal) Isoniazid (INH) Posaconazole (Noxafil) Barbiturates (Phenobarbital) Carbamazepine (Tegretol) Chlordiazepoxide (Librium) Cholestyramine (Questran) Griseofulvin Oral Contraceptives Rifampin Sucralfate (Carafate) Vitamin K   Coumadin (Warfarin) Major Herbal Interactions  Increased Warfarin Effect Decreased Warfarin Effect  Garlic Ginseng Ginkgo biloba Coenzyme Q10 Green tea St. Johns wort    Coumadin (Warfarin) FOOD Interactions  Eat a consistent number of servings per week of foods HIGH  in Vitamin K (1 serving =  cup)  Collards (cooked, or boiled & drained) Kale (cooked, or boiled & drained) Mustard greens (cooked, or boiled & drained) Parsley *serving size only =  cup Spinach (cooked, or boiled & drained) Swiss chard (cooked, or boiled & drained) Turnip greens (cooked, or boiled & drained)  Eat a consistent number of servings per week of foods MEDIUM-HIGH in Vitamin K (1 serving = 1 cup)  Asparagus (cooked, or boiled & drained) Broccoli (cooked, boiled & drained, or raw & chopped) Brussel sprouts (cooked, or boiled & drained) *serving size only =  cup Lettuce, raw (green leaf, endive, romaine) Spinach, raw Turnip greens, raw & chopped   These websites have more information on Coumadin (warfarin):  FailFactory.se; VeganReport.com.au;

## 2015-12-19 NOTE — Progress Notes (Signed)
[place] for Coumadin Indication: atrial fibrillation  Allergies  Allergen Reactions  . Ace Inhibitors Other (See Comments)    Stopped by Dr. Burt Knack due to progressive renal failure  . Prednisone Other (See Comments)    Raised blood sugar to almost 900   Labs:  Recent Labs  12/18/15 0503 12/19/15 0237  HGB 10.6* 10.6*  HCT 34.3* 32.6*  PLT 116* 108*  LABPROT 31.5* 28.2*  INR 3.11* 2.69*  CREATININE 5.23* 5.13*    Estimated Creatinine Clearance: 11.5 mL/min (by C-G formula based on Cr of 5.13).   Assessment: 82 yo M presents on 3/28 with SOB. Has a hx of Afib, CBA, and CHF. On Coumadin 5mg  daily exc for 2.5mg  on Mon. Last dose was taken 3/27. INR on admit today is 3.11.   INR today = 2.69   Goal of Therapy:  INR 2-3 Monitor platelets by anticoagulation protocol: Yes   Plan:  Monitor daily INR, CBC, s/s of bleed Coumadin 2.5 mg po x 1 dose prior to discharge  Follow up INR arranged as outpatient  Thank you Anette Guarneri, PharmD [phone]  12/19/2015 10:03 AM

## 2015-12-19 NOTE — Progress Notes (Signed)
Orders received for pt discharge.  Discharge summary printed and reviewed with pt.  Explained medication regimen, and pt had no further questions at this time.  IV removed and site remains clean, dry, intact.  Telemetry removed.  Pt in stable condition and awaiting transport. 

## 2015-12-19 NOTE — Consult Note (Signed)
   [facility] CM Inpatient Consult   12/19/2015  [name] [name] September 04, 1931 [id number] Patient is currently active with [place] Management for chronic disease management services.  Patient has been engaged by a Research scientist (medical).   Our community based plan of care has focused on disease management and community resource support.  Patient will receive a post discharge transition of care call and will be evaluated for monthly home visits for assessments and disease process education.  Made Inpatient Case Manager aware that [place] Management following. Of note, [facility] Care Management services does not replace or interfere with any services that are needed or arranged by inpatient case management or social work.  For additional questions or referrals please contact Natividad Brood, RN BSN [place] [facility] Liaison  [phone] business mobile phone Toll free office [phone]

## 2015-12-20 ENCOUNTER — Encounter: Payer: Self-pay | Admitting: Podiatry

## 2015-12-20 ENCOUNTER — Other Ambulatory Visit: Payer: Self-pay | Admitting: *Deleted

## 2015-12-20 ENCOUNTER — Ambulatory Visit (INDEPENDENT_AMBULATORY_CARE_PROVIDER_SITE_OTHER): Payer: Medicare Other | Admitting: Podiatry

## 2015-12-20 DIAGNOSIS — M216X9 Other acquired deformities of unspecified foot: Secondary | ICD-10-CM

## 2015-12-20 DIAGNOSIS — E0842 Diabetes mellitus due to underlying condition with diabetic polyneuropathy: Secondary | ICD-10-CM | POA: Diagnosis not present

## 2015-12-20 DIAGNOSIS — M79673 Pain in unspecified foot: Secondary | ICD-10-CM | POA: Diagnosis not present

## 2015-12-20 DIAGNOSIS — M79606 Pain in leg, unspecified: Secondary | ICD-10-CM

## 2015-12-20 NOTE — Progress Notes (Signed)
Subjective:  82 y.o. year old male IDDM presents with a diabetic shoe certification of need and requesting for diabetic shoes.  Having difficulty wearing regular shoes due to his neuropathy condition.   Objective:  Dermatologic:  No abnormal skin lesions other than on going palliation for mycotic nails. Vascular:  Dorsalis pedis pulses palpable on both feet.  Posterior tibial pulses are not palpable on both feet.  No edema or erythema, no ischemic changes noted.  Orthopedic:  Cavus type foot without other gross deformity.  Neurologic:  Failed respond to Monofilament sensory testing bilateral. Vibratory and Ankle DTR is within normal bilateral.   Assessment:  Dystrophic mycotic nails requiring periodic palliative care. Diabetic neuropathy difficulty finding comfortable shoes.  Pes cavus.  Treatment: May benefit from diabetic shoes with extra depth insole moldable to patient foot for neuropathic condition.  Both feet were measured for diabetic shoes.

## 2015-12-20 NOTE — Patient Instructions (Signed)
Both feet measured for diabetic shoes. 

## 2015-12-20 NOTE — Telephone Encounter (Signed)
That is fine 

## 2015-12-20 NOTE — Patient Outreach (Signed)
[facility]) Care Management  12/20/2015  [name] [name] Jun 24, 1931 [id number]   Assessment: Transition of care- initial call Referral received from hospital liaison Eliott Nine) for transition of care calls and assess for home visits. Patient recently discharged from hospital  (3/28- 3/29) for shortness of breath related to congestive heart failure. Call placed twice using home phone and mobile phone to speak with patient but unable to reach him. HIPAA compliant voice message left with name and contact information.  Plan: Will await for patient's return call. If unable to receive a call back, will schedule for next outreach call.  Chanin Frumkin A. Jeffey Janssen, BSN, RN-BC [place] Management Coordinator Cell: [phone]

## 2015-12-21 ENCOUNTER — Other Ambulatory Visit: Payer: Self-pay | Admitting: *Deleted

## 2015-12-21 NOTE — Patient Outreach (Signed)
[facility] [facility]) Care Management  12/21/2015  [name] [name]. [birth year] [id number]   Assessment: Transition of care - second attempt Received a call back from patient with a message that he was returning the call and provided his contact information. Call placed to speak with patient but unable to reach him. HIPAA compliant voice message left with name and contact number.  Plan: Will await for patient's return call. If unable to receive a call back, will schedule for next outreach call.   ADDENDUM: Unable to receive a call back from patient to message left on his voice mail yesterday. Call placed and spoke with patient. He verbalized of "feeling pretty good". Transition of care call completed. Patient has good understanding of hospital stay, diet and medications.  Patient reports still having shortness of breath on exertion, otherwise, he is back to his usual-- being able to walk around the house and able to lay in bed without using extra pillows.  Patient reports that his weight is down to 160 from 173 pounds after being diuresed during last hospitalization. He continues to monitor weight and record daily.  According to patient, he was able to sign up for the PREP exercise program (Physician Referred exercise program) with [employer] as recommended by this care management coordinator on the past encounter with patient.  Patient states that he will see kidney specialist (Dr. Lorrene Reid) for follow-up on Monday 12/24/15 since he was close to needing dialysis on this recent admission. He will see primary care provider for post hospital follow-up on 4/4. He drives himself to his providers' appointments as reported.   Per patient report, Plavix had been completed for 6 months and now taking Aspirin daily. Patient informs care management coordinator that after his provider appointments next week, he will go to [place], [place] for wife's death anniversary then plans to spend Easter with  family in [place] if cleared by kidney and primary care providers. He requests to have transition of care call next week instead of home visit.  He denies of any other immediate needs at present. He was very grateful for being directed when he called [facility] 24 hour nurse line when he started feeling distress the night prior to hospitalization. Patient is aware to call [facility] management coordinator or 24 hour nurse call line if needed. Contact information with patient.   Plan: Transition of care call as requested by patient on 12/27/15.  Makira Holleman A. Tillman Kazmierski, BSN, RN-BC [place] Management Coordinator Cell: [phone]

## 2015-12-24 ENCOUNTER — Ambulatory Visit (INDEPENDENT_AMBULATORY_CARE_PROVIDER_SITE_OTHER): Payer: Medicare Other | Admitting: General Practice

## 2015-12-24 DIAGNOSIS — I482 Chronic atrial fibrillation, unspecified: Secondary | ICD-10-CM

## 2015-12-24 DIAGNOSIS — Z5181 Encounter for therapeutic drug level monitoring: Secondary | ICD-10-CM | POA: Diagnosis not present

## 2015-12-24 LAB — POCT INR: INR: 1.8

## 2015-12-24 NOTE — Progress Notes (Signed)
Pre visit review using our clinic review tool, if applicable. No additional management support is needed unless otherwise documented below in the visit note. 

## 2015-12-25 ENCOUNTER — Encounter: Payer: Self-pay | Admitting: Internal Medicine

## 2015-12-25 ENCOUNTER — Ambulatory Visit (INDEPENDENT_AMBULATORY_CARE_PROVIDER_SITE_OTHER): Payer: Medicare Other | Admitting: Internal Medicine

## 2015-12-25 VITALS — BP 140/66 | HR 74 | Temp 97.9°F | Resp 20 | Ht 71.0 in | Wt 175.0 lb

## 2015-12-25 DIAGNOSIS — I5043 Acute on chronic combined systolic (congestive) and diastolic (congestive) heart failure: Secondary | ICD-10-CM

## 2015-12-25 DIAGNOSIS — N189 Chronic kidney disease, unspecified: Secondary | ICD-10-CM

## 2015-12-25 DIAGNOSIS — E0821 Diabetes mellitus due to underlying condition with diabetic nephropathy: Secondary | ICD-10-CM | POA: Diagnosis not present

## 2015-12-25 DIAGNOSIS — I10 Essential (primary) hypertension: Secondary | ICD-10-CM

## 2015-12-25 DIAGNOSIS — I482 Chronic atrial fibrillation, unspecified: Secondary | ICD-10-CM

## 2015-12-25 DIAGNOSIS — N184 Chronic kidney disease, stage 4 (severe): Secondary | ICD-10-CM

## 2015-12-25 DIAGNOSIS — I2510 Atherosclerotic heart disease of native coronary artery without angina pectoris: Secondary | ICD-10-CM | POA: Diagnosis not present

## 2015-12-25 MED ORDER — FUROSEMIDE 80 MG PO TABS: ORAL_TABLET | ORAL | Status: DC

## 2015-12-25 MED ORDER — ISOSORBIDE MONONITRATE ER 30 MG PO TB24: ORAL_TABLET | ORAL | Status: DC

## 2015-12-25 NOTE — Progress Notes (Signed)
Subjective:    Patient ID: [name] [name]., male    DOB: May 26, 1931, 82 y.o.   MRN: [id number]  HPI  Wt Readings from Last 3 Encounters:  12/25/15 175 lb (79.379 kg)  12/19/15 161 lb 6.4 oz (73.211 kg)  12/14/15 177 lb (80.287 kg)   Admit date: 12/18/2015 Discharge date: 12/19/2015  Recommendations for Outpatient Follow-up:  1. Needs labs on 12/21/15 as below 2. No acute changes to medications 3. Patient does not have an acute need for dialysis but is close to needing dialysis-this is to be addressed with his nephrologist as an outpatient on 12/24/15 4. See discharge summary  Discharge Diagnoses:  Principal Problem:  Acute on chronic systolic heart failure, NYHA class 2 (HCC) Active Problems:  Hyperlipidemia  Essential hypertension  CAD (coronary artery disease)  Dilated cardiomyopathy (HCC)  Chronic atrial fibrillation (HCC)  BIVentricular Defibrillator--St Jude  Chronic renal insufficiency, stage IV (severe)  Diabetes mellitus with renal complications (HCC)  Elevated troponin  Acute respiratory distress (HCC)  Acute on chronic systolic and diastolic heart failure, NYHA class 2 ([place])  Positive D dimer   82 year old patient who is seen today following a recent hospital discharge. He was admitted after an episode of severe PND and did well with  Diuresis with IV furosemide. He was seen by renal medicine yesterday and presently he is on furosemide 80 mg 3 times a day.  He is to report daily weights later this week for further adjustment  He has diabetes controlled with mixed split insulin twice daily.  Fasting blood sugars are generally in the 150 range unless  He has a light dinner.  Blood sugars been may be less than 100.  He occasionally holds his second daily insulin dose when he omits his dinner.   No further shortness of breath but does have some trace pedal edema.  There has been some weight gain since his hospital discharge  Wt Readings from Last 3  Encounters:  12/25/15 175 lb (79.379 kg)  12/19/15 161 lb 6.4 oz (73.211 kg)  12/14/15 177 lb (80.287 kg)     Lab Results  Component Value Date   HGBA1C 7.3* 12/18/2015     Past Medical History  Diagnosis Date  . Chronic atrial fibrillation ([place])     a. Historically difficult rate control. b. s/p CRT-D implantation with anticipation of AV junction ablation but patient then was able to accomplish rate control and ablation not undertaken.; AVN ablation 08/2013 by Dr Lovena Le  . CARDIOMYOPATHY, PRIMARY, DILATED     a. Mixed ICM/NICM - EF 15% by echo 05/2009; St. Jude CRT-D implanted 06/2009. b. Echo 04/2013 - EF 20%, mild LVH.  [name] Chronic systolic CHF (congestive heart failure) ([place])   . CORONARY ARTERY DISEASE     a. BMS to LAD 04/2008. b. Cath 06/2009: nonobstructive disease. c. cath 05/16/2015 DES to prox LAD  . SLEEP APNEA   . Hypertension   . Hyperlipidemia   . Diabetes mellitus   . Osteoarthritis   . Tubular adenoma of colon   . Secondary hyperparathyroidism ([place])   . CKD (chronic kidney disease), stage IV ([place])     a. Right brachiocephalic AV fistula [id number]. Neph = [place].  . Vertigo   . Valvular heart disease     a. Echo 04/2013: mild AI, mild MR.  . Pulmonary HTN ([place])     a. PA pressure 65mmHg by echo 05/01/13.  . Prostate cancer [facility])     a. s/p radical  prostatectomy.  . Colon cancer ([place])   . Skin cancer   . Presence of permanent cardiac pacemaker     Social History   Social History  . Marital Status: Widowed    Spouse Name: N/A  . Number of Children: 4  . Years of Education: N/A   Occupational History  . retired    Social History Main Topics  . Smoking status: Former Smoker    Types: Cigarettes    Quit date: 09/22/1966  . Smokeless tobacco: Never Used  . Alcohol Use: 0.0 - 0.6 oz/week    0-1 Standard drinks or equivalent per week     Comment: 05/07/2015 "might have a couple drinks/month"  . Drug Use: No  . Sexual Activity: No   Other Topics Concern  .  Not on file   Social History Narrative    Past Surgical History  Procedure Laterality Date  . Colectomy    . Prostatectomy    . Penile prosthesis placement    . Removed prosthetic eye    . Ptca      stent placed  . Total knee arthroplasty  2008    right  . Pacemaker insertion  2010  . Av fistula placement  03/30/2012    Procedure: ARTERIOVENOUS (AV) FISTULA CREATION;  Surgeon: Angelia Mould, MD;  Location: [place];  Service: Vascular;  Laterality: Right;  Creation of BrachioCephalic Fistula Right arm  . Eye surgery      left eye prothesis  . Fracture surgery      collar bone, right knee replacement  . Bi-ventricular implantable cardioverter defibrillator  (crt-d)  2010    STJ CRTD implanted by Dr [name]  . Ablation  08/2013    AVN ablation by Dr Lovena Le  . Av node ablation N/A 09/12/2013    Procedure: AV NODE ABLATION;  Surgeon: [name], MD;  Location: [facility] CATH LAB;  Service: Cardiovascular;  Laterality: N/A;  . Insert / replace / remove pacemaker    . Joint replacement    . Cardiac catheterization N/A 05/09/2015    Procedure: Left Heart Cath and Coronary Angiography;  Surgeon: Sherren Mocha, MD;  Location: [place] CV LAB;  Service: Cardiovascular;  Laterality: N/A;  . Cardiac catheterization N/A 05/16/2015    Procedure: Coronary Stent Intervention;  Surgeon: Sherren Mocha, MD;  Location: [place] CV LAB;  Service: Cardiovascular;  Laterality: N/A;    Family History  Problem Relation Age of Onset  . Heart disease Father   . Throat cancer Father   . Throat cancer Mother   . Heart disease Mother   . Hypertension Mother     Allergies  Allergen Reactions  . Ace Inhibitors Other (See Comments)    Stopped by Dr. Burt Knack due to progressive renal failure  . Prednisone Other (See Comments)    Raised blood sugar to almost 900    Current Outpatient Prescriptions on File Prior to Visit  Medication Sig Dispense Refill  . acetaminophen (TYLENOL) 500 MG tablet  Take 500 mg by mouth as needed for mild pain (2 tablets once daily if needed).     [name] aspirin EC 81 MG tablet Take 1 tablet (81 mg total) by mouth daily. 90 tablet 3  . calcitRIOL (ROCALTROL) 0.25 MCG capsule Take 0.25 mcg by mouth. Monday, Wed and Friday    . calcium carbonate (TUMS EX) 750 MG chewable tablet Chew 1 tablet by mouth 3 (three) times daily with meals.    [name] Grabill 125 MCG  tablet Take 1 tablet by mouth  every other day 45 tablet 1  . HUMALOG MIX 75/25 (75-25) 100 UNIT/ML SUSP injection Inject subcutaneously 26  units two times daily with  meals 5 vial 3  . hydrALAZINE (APRESOLINE) 25 MG tablet Take 1 and 1/2 tablets by  mouth 3 times daily 405 tablet 1  . metoprolol succinate (TOPROL-XL) 100 MG 24 hr tablet Take 1 tablet by mouth  twice a day, with or  immediately following   meals 180 tablet 2  . multivitamin (RENA-VIT) TABS tablet Take 1 tablet by mouth daily.    . potassium chloride SA (K-DUR,KLOR-CON) 20 MEQ tablet Take 2 tablets (40 mEq total) by mouth daily. 60 tablet 3  . warfarin (COUMADIN) 5 MG tablet Take 2.5 mg (one-half tablet) by mouth Mondays, and one whole tablet by mouth al other days (tue, Wed, Thur, Fri, Sat, Sun) 90 tablet 1   No current facility-administered medications on file prior to visit.    BP 140/66 mmHg  Pulse 74  Temp(Src) 97.9 F (36.6 C) (Oral)  Resp 20  Ht 5\' 11"  (1.803 m)  Wt 175 lb (79.379 kg)  BMI 24.42 kg/m2  SpO2 98%    Review of Systems  Constitutional: Positive for unexpected weight change. Negative for fever, chills, appetite change and fatigue.  HENT: Negative for congestion, dental problem, ear pain, hearing loss, sore throat, tinnitus, trouble swallowing and voice change.   Eyes: Negative for pain, discharge and visual disturbance.  Respiratory: Negative for cough, chest tightness, wheezing and stridor.   Cardiovascular: Positive for leg swelling. Negative for chest pain and palpitations.  Gastrointestinal: Negative for nausea,  vomiting, abdominal pain, diarrhea, constipation, blood in stool and abdominal distention.  Genitourinary: Negative for urgency, hematuria, flank pain, discharge, difficulty urinating and genital sores.  Musculoskeletal: Negative for myalgias, back pain, joint swelling, arthralgias, gait problem and neck stiffness.  Skin: Negative for rash.  Neurological: Negative for dizziness, syncope, speech difficulty, weakness, numbness and headaches.  Hematological: Negative for adenopathy. Does not bruise/bleed easily.  Psychiatric/Behavioral: Negative for behavioral problems and dysphoric mood. The patient is not nervous/anxious.        Objective:   Physical Exam  Constitutional: He is oriented to person, place, and time. He appears well-developed.  HENT:  Head: Normocephalic.  Right Ear: External ear normal.  Left Ear: External ear normal.  Eyes: Conjunctivae and EOM are normal.  Neck: Normal range of motion.  Prominent jugular venous pressure, supine  Cardiovascular: Normal rate and normal heart sounds.   Grade 2/6 systolic murmur at the apex  Audible sounds from aVF, right arm  Pulmonary/Chest: Breath sounds normal. He has no rales.  O2 saturation 98%  Abdominal: Bowel sounds are normal.  Musculoskeletal: Normal range of motion. He exhibits edema. He exhibits no tenderness.  Trace edema  Neurological: He is alert and oriented to person, place, and time.  Psychiatric: He has a normal mood and affect. His behavior is normal.          Assessment & Plan:   Chronic systolic heart failure Diabetes mellitus, reasonable control Chronic kidney disease.  Follow-up nephrology Coronary artery disease Essential hypertension, stable  No change in therapy Low-salt diet.  Stressed Continue daily weights and diuretic therapy  Follow-up 3 months or as needed

## 2015-12-25 NOTE — Patient Instructions (Signed)
Limit your sodium (Salt) intake   check your weight daily;   Please notify the office if there is any significant weight gain   Renal medicine follow-up as scheduled   Please check your hemoglobin A1c every 3 months

## 2015-12-25 NOTE — Progress Notes (Signed)
Pre visit review using our clinic review tool, if applicable. No additional management support is needed unless otherwise documented below in the visit note. 

## 2015-12-27 ENCOUNTER — Encounter: Payer: Self-pay | Admitting: *Deleted

## 2015-12-27 ENCOUNTER — Other Ambulatory Visit: Payer: Self-pay | Admitting: *Deleted

## 2015-12-27 NOTE — Patient Outreach (Signed)
[facility]) Care Management  12/27/2015  [place] 1930/12/09 [id number]   Assessment: Transition of care - week 2 Call placed and spoke to patient stating that he feels "relatively good". He shared feeling of sadness since yesterday was his wife's 2nd year death anniversary. Patient and daughter drove to [place], [place] where wife's ashes was placed as reported.  Patient's weight today is 170. 5 pounds. Weight ranges from 168 - 170's per patient. He reports that he resumed monitoring and recording weight daily after his follow-up appointment with nephrologist (Dr. Lorrene Reid) on 4/3. He was told to loose 5 pounds by provider. According to patient, he was also told by nephrologist that dialysis is not necessary at this time. Patient was instructed to call his weight readings to Dr. Lorrene Reid tomorrow and will be given further instructions and medication adjustment if needed by then.  Patient shares that he will resume his exercises at the [facility] after speaking with Dr. Lorrene Reid about his weight readings tomorrow.  Mr. Bushey was seen by primary care provider on 4/4. Per report, hypertension is stable but was reminded to limit salt intake. Diabetes is controlled with inulin twice a day. Blood sugar today is 144 and ranges on the 150's.  Patient denies shortness of breath, chest tightness and dizziness. He states slight swelling more evident to right leg than left. He reports having watery eyes and nose from allergy. Patient was encouraged to take adequate rest and hydrate himself. According to patient, he was cleared by his providers to travel to his family in [place] for Easter and he is looking forward to it.  Patient expressed awareness of heart failure zones, signs and symptoms to monitor for heart failure flare-up and when to call provider for help.   Patient denies any needs or concerns at present. For right now, he is just waiting for further instructions from his kidney doctor  and will go from there, as stated by patient. He agreed to initial home visit next week. Patient was encouraged to call [facility] management coordinator or 24 hour nurse call line if needed. Contact information with patient.   Plan: Initial home visit on 12/31/15.  Ommie Degeorge A. Marshawn Ninneman, BSN, RN-BC [place] Management Coordinator Cell: [phone]

## 2015-12-31 ENCOUNTER — Encounter: Payer: Self-pay | Admitting: *Deleted

## 2015-12-31 ENCOUNTER — Other Ambulatory Visit: Payer: Self-pay | Admitting: *Deleted

## 2015-12-31 NOTE — Patient Outreach (Addendum)
[facility]) Care Management   12/31/2015  [place] 03-17-1931 [id number]  [name] [name]. is an 82 y.o. male  Subjective: Patient reports "feeling good" and states "hospitalization from heart failure was a surprise" since he was "doing really good" and following everything to manage heart failure but "all of a sudden just crashed and admitted to the hospital".  Objective: BP 138/70 mmHg  Pulse 76  Resp 18  SpO2 98%   Review of Systems  Constitutional: Negative.   HENT: Positive for hearing loss.        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 Negative for cough, sputum production and wheezing.        Respirations even and unlabored Lung sounds diminished otherwise clear Slight short of breath when going upstairs CPAP use at hs  Cardiovascular: Negative for chest pain and leg swelling.       Has pacemaker Regular rate and rhythm  Gastrointestinal: Negative.  Negative for abdominal pain.       Abdomen soft, non-tender and non-distended Bowel sounds present  Genitourinary: Positive for frequency.  Musculoskeletal: Negative for falls.  Skin:       Dark age spots to face  Neurological: Negative.   Endo/Heme/Allergies: Negative.   Psychiatric/Behavioral: Negative.        Lonely at times when remembers his wife    Physical Exam  Encounter Medications:   Outpatient Encounter Prescriptions as of 12/31/2015  Medication Sig Note  . acetaminophen (TYLENOL) 500 MG tablet Take 500 mg by mouth as needed for mild pain (2 tablets once daily if needed).    [name] aspirin EC 81 MG tablet Take 1 tablet (81 mg total) by mouth daily.   . calcitRIOL (ROCALTROL) 0.25 MCG capsule Take 0.25 mcg by mouth. Monday, Wed and Friday 06/04/2015: .   . calcium carbonate (TUMS EX) 750 MG chewable tablet Chew 1 tablet by mouth 3 (three) times daily with meals.   [name] DIGOX 125  MCG tablet Take 1 tablet by mouth  every other day   . furosemide (LASIX) 80 MG tablet Take 1 tablet three times daily.  May take one extra tablet daily for symptoms or 3 pound weight gain.   [name] HUMALOG MIX 75/25 (75-25) 100 UNIT/ML SUSP injection Inject subcutaneously 26  units two times daily with  meals   . hydrALAZINE (APRESOLINE) 25 MG tablet Take 1 and 1/2 tablets by  mouth 3 times daily   . isosorbide mononitrate (IMDUR) 30 MG 24 hr tablet Take 1 tablet by mouth  daily   . metoprolol succinate (TOPROL-XL) 100 MG 24 hr tablet Take 1 tablet by mouth  twice a day, with or  immediately following   meals   . multivitamin (RENA-VIT) TABS tablet Take 1 tablet by mouth daily.   . potassium chloride SA (K-DUR,KLOR-CON) 20 MEQ tablet Take 2 tablets (40 mEq total) by mouth daily.   [name] warfarin (COUMADIN) 5 MG tablet Take 2.5 mg (one-half tablet) by mouth Mondays, and one whole tablet by mouth al other days (tue, Wed, Thur, Fri, Sat, Sun)    No facility-administered encounter medications on file as of 12/31/2015.    Functional Status:   In your present state of health, do you have any difficulty performing the following activities: 12/22/2015 12/18/2015  Hearing? N N  Vision? N N  Difficulty concentrating or  making decisions? N N  Walking or climbing stairs? N N  Dressing or bathing? N N  Doing errands, shopping? N N  Preparing Food and eating ? N -  Using the Toilet? N -  In the past six months, have you accidently leaked urine? N -  Do you have problems with loss of bowel control? N -  Managing your Medications? N -  Managing your Finances? N -  Housekeeping or managing your Housekeeping? N -    Fall/Depression Screening:    PHQ 2/9 Scores 12/31/2015 12/27/2015 11/12/2015 10/12/2015 09/07/2015 08/23/2015 07/02/2015  PHQ - 2 Score 1 1 0 0 0 0 0    Assessment:    82 year old male being seen for heart failure management. Arrived at patient's two-storey home. He lives alone but has a step-  daughter Roanna Epley) who lives close by and assists him when needed. Patient has a son in [place] and in [place] that he visits.  He continues to drive and transports himself to providers' appointments but step-daughter accompanies him at times as reported by patient. He identifies himself on the "green zone" and denies cough, mucus, chest discomfort and change in appetite. No noted swelling to lower extremities and no reportable weight increase. He states having some shortness of breath when getting up the stairs. Encouraged patient to take rest periods at intervals to prevent it.  Patient's weight today is 169 pounds. Weight ranges from 166 - 170's per patient. He had resumed monitoring and recording weight daily after nephrologist follow-up (Dr. Lorrene Reid).  Patient reports had not received a call back from nephrologist after he called last Friday to report his weight readings. Care management coordinator encouraged patient to call nephrologist's office during this visit to get further instructions regarding medication (Lasix) and treatment. He was able to receive instructions to keep the same dose of Lasix as he currently takes and have blood works tomorrow to follow-up kidney function (creatinine).   Patient plans to resume exercises at the [facility] after Dr. Lorrene Reid gives approval for him to do so as stated by patient.  He reports that his allergy comes and goes with watery eyes and runny nose. Patient encouraged to take adequate rest and hydration. Patient continues to be followed by cardiology monthly for pacemaker battery watch. Patient is traveling to visit his family in [place] for Easter and will be back on the last week of April. He agreed to be called on his cell phone for transition of care call next week. Patient has a good understanding of monitoring the signs and symptoms of heart failure and when to call the doctor for help as well as using the heart failure zone to know how he feels  daily.  Patient is unable to identify any further needs or concerns at this time. Patient was encouraged to call [facility] management coordinator or 24 hour nurse call line as needed. Patient has the contact information.   Plan: Transition of care call on 01/09/16.   THN CM Care Plan Problem One        Most Recent Value   Care Plan Problem One  at risk for readmission   Role Documenting the Problem One  Care Management Corder for Problem One  Active   THN Long Term Goal (31-90 days)  patient will not be readmitted in the next 31 days   THN Long Term Goal Start Date  12/31/15   Interventions for Problem One Long Term Goal  encourage to  monitor weight daily and record,  call weight results to kidney doctor as instructed for treatment/ medication adjustment,  encourage to attend scheduled providers' appointments,  encourage adherence to medications as ordered,  emphasize need to maintain low sodium, heart healthy diet,  advise to evaluate self daily using heart failure zone tool,  advise to continue with exercises as tolerated and when given the approval by provider   THN CM Short Term Goal #1 (0-30 days)  patient will check weight daily and record in the next 30 days   THN CM Short Term Goal #1 Start Date  12/31/15   Interventions for Short Term Goal #1  encourage patient to check weight daily and record,  advised proper way of weighing self in the morning after using the bathroom, before getting dressed, before eating a meal,  encourage to record weight readings,  advise to call in weight results to kidney doctor as instructed for medication and treatment adjustment,  instruct to report weight gain of 3 pounds in a day and 5 pounds in a week to provider   THN CM Short Term Goal #2 (0-30 days)  patient will continue to take medications as prescribed in the next 30 days   THN CM Short Term Goal #2 Start Date  12/31/15   Interventions for Short Term Goal #2  encourage medication  adherence as prescribed,  discuss the benefits and importance of medication adherence especially with new medication adjustments,  encourage to call kidney doctor to inform of weight results monitored as ordered and follow medication adjustments or instructions especially regarding Lasix     Edwena Felty A. Cameran Ahmed, BSN, RN-BC [place] Management Coordinator Cell: [phone]

## 2016-01-09 ENCOUNTER — Other Ambulatory Visit: Payer: Self-pay | Admitting: *Deleted

## 2016-01-09 NOTE — Patient Outreach (Signed)
[facility]) Care Management  01/09/2016  Royle Sabath. 1931-03-07 [id number]   Assessment: Transition of care - week 4  Message received from [facility] office (case management assistant) that patient had called requesting to reschedule transition of care call to another date since he is out of town (in [place]) visiting family.  Call placed to reschedule appointment to another date when patient is back home but unable to reach him. HIPAA compliant voice message left with name and contact number.   Inbound call received from patient. He went ahead and gave care management coordinator updates of his condition and information regarding recent visit with renal doctor. Patient states that he "feels good physically". He reports that blood work was done on visit with Dr. Lorrene Reid and he was instructed to stop taking Calcitriol since calcium level was elevated and to decrease Lasix to 80 mg twice a day. He states that he weighed himself upon arrival to [place] using their scale and it was 172 pounds. Patient reports that his weight today was 173.5 pounds. He denies chest tightness, cough, mucus production and worsening shortness of breath than usual. He mentioned that swelling is improved to his ankles. He considered himself being on the "green zone". Patient is aware to call provider if he feels moving to the yellow zone or call 911 if he feels being in the red zone. Patient states he is scheduled to follow-up with kidney doctor on 4/28 (when he arrives from [place]).  Patient denies of any other needs or concerns at this time. He requested care management coordinator to call him when he is back home to set-up a schedule for routine home visit next month since he does not have his calendar with him at present. Encouraged patient to call [facility] management coordinator or 24 hour nurse line if necessary or if further needs arise.   Plan: Will call to schedule a routine home  visit for next month when patient is back next week per his request.  Edwena Felty A. Gavriel Holzhauer, BSN, RN-BC [place] Management Coordinator Cell: [phone]

## 2016-01-10 ENCOUNTER — Other Ambulatory Visit: Payer: Self-pay | Admitting: Internal Medicine

## 2016-01-11 ENCOUNTER — Ambulatory Visit (INDEPENDENT_AMBULATORY_CARE_PROVIDER_SITE_OTHER): Payer: Medicare Other | Admitting: *Deleted

## 2016-01-11 DIAGNOSIS — Z4502 Encounter for adjustment and management of automatic implantable cardiac defibrillator: Secondary | ICD-10-CM

## 2016-01-11 NOTE — Progress Notes (Signed)
Remote ICD transmission.   

## 2016-01-15 ENCOUNTER — Other Ambulatory Visit: Payer: Self-pay | Admitting: *Deleted

## 2016-01-15 NOTE — Patient Outreach (Signed)
[facility]) Care Management  01/15/2016  [place] July 05, 1931 [id number]   Assessment: Care coordination call Call placed and spoke with patient stating he "feels pretty good". He shared that he had a "terrible ride back from [place] yesterday because of the rain" but he "made it". Patient verbalized that he had a "great time" spent with his family and he "enjoyed a lot". He shares that it was sort of hard to come back.  Patient reports that his weight in [place] fluctuated from 172- 175 pounds with no reportable weight gain. His weight today was 174 pounds. Patient categorized himself towards the "green zone" with no cough or mucus production. Although he reports having nasal congestion/ stuffiness from high humidity (more on allergy) as stated. Patient expressed understanding to call provider if moving to the yellow zone and call 911 if in the red zone.  Patient verbalized that his "legs feel a little tighter" (probably from the drive yesterday) and he states going back to the [facility] starting today to do his exercises (bike and aerobics).  Patient mentioned that he will see Dr. Lorrene Reid (kidney) again this Friday 4/28.   Patient denies any needs or concerns at this time. He requested not to schedule routine home visit from April 30 to May 14 since he is planning to go to [place] with his son for 2 weeks. He agreed to a home visit on 5/15 Monday and hoping that he will be back by then. Patient is aware to call [facility] management coordinator or 24 hour nurse line as necessary. Contact information is with patient.   Plan: Will do routine home visit on 02/04/16 per patient's request (after coming from a 2-week vacation in [place])  Chase. Vinisha Faxon, BSN, RN-BC [place] Management Coordinator Cell: [phone]

## 2016-01-17 LAB — CUP PACEART REMOTE DEVICE CHECK
Battery Remaining Longevity: 8 mo
Battery Remaining Percentage: 18 %
Date Time Interrogation Session: 20170421060009
HighPow Impedance: 38 Ohm
HighPow Impedance: 41 Ohm
Implantable Lead Implant Date: 20101028
Implantable Lead Location: 753858
Implantable Lead Location: 753860
Implantable Lead Model: 4196
Implantable Lead Model: 7121
Lead Channel Impedance Value: 380 Ohm
Lead Channel Impedance Value: 430 Ohm
Lead Channel Pacing Threshold Amplitude: 3.5 V
Lead Channel Pacing Threshold Pulse Width: 1 ms
Lead Channel Sensing Intrinsic Amplitude: 12 mV
Lead Channel Setting Pacing Amplitude: 2.5 V
Lead Channel Setting Pacing Pulse Width: 0.6 ms
Lead Channel Setting Sensing Sensitivity: 0.5 mV
Lead Channel Setting Sensing Sensitivity: 0.5 mV
MDC IDC LEAD IMPLANT DT: 20101028
MDC IDC LEAD IMPLANT DT: 20101028
MDC IDC LEAD IMPLANT DT: 20101028
MDC IDC LEAD LOCATION: 753860
MDC IDC LEAD LOCATION: 753858
MDC IDC LEAD MODEL: 7121
MDC IDC LEAD MODEL: 4196
MDC IDC MSMT BATTERY REMAINING LONGEVITY: 7 mo
MDC IDC MSMT BATTERY REMAINING PERCENTAGE: 16 %
MDC IDC MSMT BATTERY VOLTAGE: 2.74 V
MDC IDC MSMT BATTERY VOLTAGE: 2.77 V
MDC IDC MSMT LEADCHNL LV IMPEDANCE VALUE: 430 Ohm
MDC IDC MSMT LEADCHNL RV IMPEDANCE VALUE: 430 Ohm
MDC IDC MSMT LEADCHNL RV PACING THRESHOLD AMPLITUDE: 1 V
MDC IDC MSMT LEADCHNL RV PACING THRESHOLD PULSEWIDTH: 0.6 ms
MDC IDC MSMT LEADCHNL RV SENSING INTR AMPL: 11.8 mV
MDC IDC PG SERIAL: 595859
MDC IDC SESS DTM: 20170320092248
MDC IDC SET LEADCHNL LV PACING AMPLITUDE: 5 V
MDC IDC SET LEADCHNL LV PACING AMPLITUDE: 5 V
MDC IDC SET LEADCHNL LV PACING PULSEWIDTH: 1 ms
MDC IDC SET LEADCHNL LV PACING PULSEWIDTH: 1 ms
MDC IDC SET LEADCHNL RV PACING AMPLITUDE: 2.5 V
MDC IDC SET LEADCHNL RV PACING PULSEWIDTH: 0.6 ms
Pulse Gen Serial Number: 595859

## 2016-01-18 ENCOUNTER — Encounter: Payer: Self-pay | Admitting: Cardiology

## 2016-01-21 ENCOUNTER — Ambulatory Visit: Payer: Medicare Other

## 2016-01-23 ENCOUNTER — Ambulatory Visit: Payer: Medicare Other

## 2016-01-24 ENCOUNTER — Telehealth: Payer: Self-pay | Admitting: Cardiology

## 2016-01-24 NOTE — Telephone Encounter (Signed)
LMOVM requesting that pt send manual transmission b/c home monitor has not updated in at least 8 days.   

## 2016-02-04 ENCOUNTER — Encounter: Payer: Self-pay | Admitting: *Deleted

## 2016-02-04 ENCOUNTER — Other Ambulatory Visit: Payer: Self-pay | Admitting: *Deleted

## 2016-02-04 NOTE — Patient Outreach (Signed)
[facility]) Care Management   02/04/2016  [place] Feb 10, 1931 [id number]  [name] [name]. is an 82 y.o. male  Subjective: Patient states "feeling wonderful". He just arrived from [place] yesterday after visiting with family (son). He mentioned driving himself to and from [place] without difficulty. He states "living life one day at a time" to avoid increased stress.  Objective: BP 122/70 mmHg  Pulse 75  Resp 17  SpO2 96%   Review of Systems  Constitutional: Negative.   HENT: Positive for congestion and hearing loss.        Slight hearing loss- no hearing aid use needed Nasal congestion- possible allergies per patient  Eyes: Negative.        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 Negative for sputum production, shortness of breath and wheezing.        Occasional cough Diminished breath sounds to bases, otherwise clear Respirations even and unlabored Uses CPAP at night  Cardiovascular: Positive for leg swelling. Negative for chest pain.       Regular rate and rhythm Has pacemaker 1- 2+ pitting edema to left ankle, left greater than right  Gastrointestinal: Negative for nausea, vomiting and abdominal pain.       Abdomen soft, non-distended, non-tender Bowel sounds present  Genitourinary: Positive for frequency.  Musculoskeletal: Negative for falls.  Skin: Negative.   Neurological: Negative.   Endo/Heme/Allergies: Bruises/bleeds easily.  Psychiatric/Behavioral: Negative.     Physical Exam  Encounter Medications:   Outpatient Encounter Prescriptions as of 02/04/2016  Medication Sig Note  . acetaminophen (TYLENOL) 500 MG tablet Take 500 mg by mouth as needed for mild pain (2 tablets once daily if needed).    [name] aspirin EC 81 MG tablet Take 1 tablet (81 mg total) by mouth daily.   . calcium carbonate (TUMS EX) 750 MG chewable tablet Chew 1 tablet by mouth 3  (three) times daily with meals.   [name] DIGOX 125 MCG tablet Take 1 tablet by mouth  every other day   . furosemide (LASIX) 80 MG tablet Take 1 tablet three times daily.  May take one extra tablet daily for symptoms or 3 pound weight gain. 01/09/2016: Decreased to 80 mg twice a day as ordered by Dr. Lorrene Reid (Kidney) per patient  . HUMALOG MIX 75/25 (75-25) 100 UNIT/ML SUSP injection Inject subcutaneously 26  units two times daily with  meals   . hydrALAZINE (APRESOLINE) 25 MG tablet Take 1 and 1/2 tablets by  mouth 3 times daily   . isosorbide mononitrate (IMDUR) 30 MG 24 hr tablet Take 1 tablet by mouth  daily   . metoprolol succinate (TOPROL-XL) 100 MG 24 hr tablet TAKE 1 TABLET BY MOUTH  TWICE A DAY, WITH OR  IMMEDIATELY FOLLOWING MEALS   . multivitamin (RENA-VIT) TABS tablet Take 1 tablet by mouth daily.   . nitroGLYCERIN (NITROSTAT) 0.4 MG SL tablet Place 0.4 mg under the tongue every 5 (five) minutes as needed for chest pain.   . potassium chloride SA (K-DUR,KLOR-CON) 20 MEQ tablet Take 2 tablets (40 mEq total) by mouth daily.   [name] warfarin (COUMADIN) 5 MG tablet Take 2.5 mg (one-half tablet) by mouth Mondays, and one whole tablet by mouth al other days (tue, Wed, Thur, Fri, Sat, Sun)   . calcitRIOL (ROCALTROL) 0.25 MCG capsule Take 0.25 mcg by mouth. Reported on 02/04/2016 06/04/2015: .    No facility-administered encounter  medications on file as of 02/04/2016.    Functional Status:   In your present state of health, do you have any difficulty performing the following activities: 02/04/2016 12/22/2015  Hearing? N N  Vision? N N  Difficulty concentrating or making decisions? N N  Walking or climbing stairs? N N  Dressing or bathing? N N  Doing errands, shopping? N N  Preparing Food and eating ? N N  Using the Toilet? N N  In the past six months, have you accidently leaked urine? N N  Do you have problems with loss of bowel control? N N  Managing your Medications? N N  Managing your Finances? N  N  Housekeeping or managing your Housekeeping? N N    Fall/Depression Screening:    PHQ 2/9 Scores 02/04/2016 12/31/2015 12/27/2015 11/12/2015 10/12/2015 09/07/2015 08/23/2015  PHQ - 2 Score 0 1 1 0 0 0 0    Assessment:   82 year old male being seen for congestive heart failure. Arrived at patient's townhouse. He still has luggage unpacked since he just arrived yesterday from his 2-week vacation with son in [place]. He reports weight today is 174 pounds. Patient shares weighing self and recording in the calendar daily when he is in town. Weight had been ranging from 169 - 175.5 pounds without significant/ reportable weight gain as stated. Patient states having an order of take extra dose of Lasix for noted weight gain.  Patient denies any signs and symptoms of heart failure flare-up at present. He considers himself under "green zone" and expresses understanding to call the doctor for help if moving into the yellow zone and call 911 if in the red zone. Patient remains with slight swelling to both ankles per usual (left greater than right at this time).  Patient is knowledgeable of heart failure zones, zone tool and signs and symptoms of heart failure. He has heart failure action plan that he follows when starts to feel/ see it. He mentions talking with [facility] today, coordinator of exercise program at the [facility] regarding plans in resuming exercises when "things get settled" as stated.  In the meantime, he continues with walking exercises daily per patient report.  According to patient, his kidney doctor is looking at possibility of dialysis due to failure of his kidney to eliminate body fluids.  He is currently on Lasix 80 mg twice a day. He reports trying to get as close to his diet restriction: low salt, heart healthy diabetic diet as he could. Food guide for low salt and high salt food was previously provided.  Medications reviewed and patient continues to manage his own medications with no  difficulties reported.   Patient had AV graft previously put in place to right upper arm in case he needs it.   EMMI video on dialysis options viewed by patient during this visit and EMMI educational material on dialysis was printed for patient to read.  Patient was encouraged to call and discuss with kidney doctor if he has any questions related to dialysis.  Patient denies any additional needs or concerns at this time. He agreed to home visit next month. Encouraged patient to call [facility] management coordinator or 24-hour nurse line if needed. Contact information with patient.    Plan: Routine home visit on 03/05/16.   THN CM Care Plan Problem One        Most Recent Value   Care Plan Problem One  at risk for readmission   Role Documenting the Problem One  Care Management Coordinator   Care Plan for Problem One  Active   THN Long Term Goal (31-90 days)  patient will not be readmitted in the next 31 days   THN Long Term Goal Start Date  12/31/15   [facility] Long Term Goal Met Date  02/04/16 [Goal met- no readmission]   THN CM Short Term Goal #1 (0-30 days)  patient will check weight daily and record in the next 30 days   THN CM Short Term Goal #1 Start Date  12/31/15 [facility] met-weight check QD & recorded on calendar if in town]   [facility] CM Short Term Goal #1 Met Date  02/04/16 [facility] met-weight check QD & recorded on calendar if in town]   [facility] CM Short Term Goal #2 (0-30 days)  patient will continue to take medications as prescribed in the next 30 days   THN CM Short Term Goal #2 Start Date  12/31/15   [facility] CM Short Term Goal #2 Met Date  02/04/16 [Goal met- cont.to take medications-Lasix adjustments ordered]   THN CM Short Term Goal #3 (0-30 days)  patient will verbalize 5 steps in managing heart failure or living life better with heart failure in the next 30 days    THN CM Short Term Goal #3 Start Date  02/04/16   Interventions for Short Tern Goal #3  encourage patient to review [facility] packet  provided particularly the pamphlet about living life better (managing) heart failure,  discuss with patient regarding continuing to weigh self daily and record, use of heart failure zone tool to know how patient feels everyday, adherence to medications, maintaining low salt diet and continuing to exercise as tolerated,       THN CM Care Plan Problem Two        Most Recent Value   Care Plan Problem Two  Failure of kidney to eliminate excess fluid from the body with dialysis option per kidney doctor    Role Documenting the Problem Two  Care Management [place] for Problem Two  Active   THN CM Short Term Goal #1 (0-30 days)  Patient will verbalize being prepared for dialysis as his option with in the next 30 days   THN CM Short Term Goal #1 Start Date  02/04/16   Interventions for Short Term Goal #2   provide printed EMMI educational materials regarding dialysis for patient, encouraged to read and ask questions,  facilitated patient in watching EMMI video regarding dialysis/ options,  encourage patient to discuss and review with kidney doctor regarding dialysis information and education on dialysis,  allow patient to discuss thoughts and ask questions if any     Rashaud Ybarbo A. Lalita Ebel, BSN, RN-BC [place] Management Coordinator Cell: [phone]

## 2016-02-06 ENCOUNTER — Emergency Department (HOSPITAL_COMMUNITY)
Admission: EM | Admit: 2016-02-06 | Discharge: 2016-02-06 | Disposition: A | Discharge status: Home / Self Care | Payer: Medicare Other | Attending: Emergency Medicine | Admitting: Emergency Medicine

## 2016-02-06 ENCOUNTER — Ambulatory Visit (INDEPENDENT_AMBULATORY_CARE_PROVIDER_SITE_OTHER): Payer: Medicare Other | Admitting: General Practice

## 2016-02-06 ENCOUNTER — Ambulatory Visit: Payer: Medicare Other

## 2016-02-06 ENCOUNTER — Encounter (HOSPITAL_COMMUNITY): Payer: Self-pay | Admitting: Emergency Medicine

## 2016-02-06 DIAGNOSIS — Z86018 Personal history of other benign neoplasm: Secondary | ICD-10-CM | POA: Insufficient documentation

## 2016-02-06 DIAGNOSIS — I482 Chronic atrial fibrillation, unspecified: Secondary | ICD-10-CM

## 2016-02-06 DIAGNOSIS — Z85038 Personal history of other malignant neoplasm of large intestine: Secondary | ICD-10-CM | POA: Insufficient documentation

## 2016-02-06 DIAGNOSIS — I5022 Chronic systolic (congestive) heart failure: Secondary | ICD-10-CM | POA: Insufficient documentation

## 2016-02-06 DIAGNOSIS — Z5181 Encounter for therapeutic drug level monitoring: Secondary | ICD-10-CM | POA: Diagnosis not present

## 2016-02-06 DIAGNOSIS — Z79899 Other long term (current) drug therapy: Secondary | ICD-10-CM | POA: Diagnosis not present

## 2016-02-06 DIAGNOSIS — Z85828 Personal history of other malignant neoplasm of skin: Secondary | ICD-10-CM | POA: Diagnosis not present

## 2016-02-06 DIAGNOSIS — N186 End stage renal disease: Secondary | ICD-10-CM | POA: Diagnosis not present

## 2016-02-06 DIAGNOSIS — Z8546 Personal history of malignant neoplasm of prostate: Secondary | ICD-10-CM | POA: Diagnosis not present

## 2016-02-06 DIAGNOSIS — Z87891 Personal history of nicotine dependence: Secondary | ICD-10-CM | POA: Insufficient documentation

## 2016-02-06 DIAGNOSIS — T8241XA Breakdown (mechanical) of vascular dialysis catheter, initial encounter: Secondary | ICD-10-CM | POA: Diagnosis not present

## 2016-02-06 DIAGNOSIS — I2510 Atherosclerotic heart disease of native coronary artery without angina pectoris: Secondary | ICD-10-CM | POA: Insufficient documentation

## 2016-02-06 DIAGNOSIS — T82590A Other mechanical complication of surgically created arteriovenous fistula, initial encounter: Secondary | ICD-10-CM

## 2016-02-06 DIAGNOSIS — Z794 Long term (current) use of insulin: Secondary | ICD-10-CM | POA: Diagnosis not present

## 2016-02-06 DIAGNOSIS — E1122 Type 2 diabetes mellitus with diabetic chronic kidney disease: Secondary | ICD-10-CM | POA: Insufficient documentation

## 2016-02-06 DIAGNOSIS — Y658 Other specified misadventures during surgical and medical care: Secondary | ICD-10-CM | POA: Insufficient documentation

## 2016-02-06 DIAGNOSIS — M1990 Unspecified osteoarthritis, unspecified site: Secondary | ICD-10-CM | POA: Insufficient documentation

## 2016-02-06 DIAGNOSIS — Z7982 Long term (current) use of aspirin: Secondary | ICD-10-CM | POA: Insufficient documentation

## 2016-02-06 DIAGNOSIS — T82838A Hemorrhage of vascular prosthetic devices, implants and grafts, initial encounter: Secondary | ICD-10-CM | POA: Diagnosis present

## 2016-02-06 DIAGNOSIS — Z9889 Other specified postprocedural states: Secondary | ICD-10-CM | POA: Insufficient documentation

## 2016-02-06 DIAGNOSIS — Z992 Dependence on renal dialysis: Secondary | ICD-10-CM | POA: Diagnosis not present

## 2016-02-06 DIAGNOSIS — I120 Hypertensive chronic kidney disease with stage 5 chronic kidney disease or end stage renal disease: Secondary | ICD-10-CM | POA: Diagnosis not present

## 2016-02-06 LAB — CBC
HEMATOCRIT: 34.4 % — AB (ref 39.0–52.0)
Hemoglobin: 11.4 g/dL — ABNORMAL LOW (ref 13.0–17.0)
MCH: 30.9 pg (ref 26.0–34.0)
MCHC: 33.1 g/dL (ref 30.0–36.0)
MCV: 93.2 fL (ref 78.0–100.0)
PLATELETS: 101 10*3/uL — AB (ref 150–400)
RBC: 3.69 MIL/uL — ABNORMAL LOW (ref 4.22–5.81)
RDW: 15 % (ref 11.5–15.5)
WBC: 4.4 10*3/uL (ref 4.0–10.5)

## 2016-02-06 LAB — POCT INR: INR: 3.6

## 2016-02-06 NOTE — Discharge Instructions (Signed)
Dialysis Vascular Access Malfunction °A vascular access is an entrance to your blood vessels that can be used for dialysis. A vascular access can be made in one of several ways:  °· Joining an artery to a vein under your skin to make a bigger blood vessel called a fistula.   °· Joining an artery to a vein under your skin using a soft tube called a graft.   °· Placing a thin, flexible tube (catheter) in a large vein, usually in your neck.   °A vascular access may malfunction or become blocked.  °WHAT CAN CAUSE YOUR VASCULAR ACCESS TO MALFUNCTION? °· Infection (common).   °· A blood clot inside a part of the fistula, graft, or catheter. A blood clot can completely or partially block the flow of blood.   °· A kink in the graft or catheter.   °· A collection of blood (called a hematoma or bruise) next to the graft or catheter that pushes against it, blocking the flow of blood.   °WHAT ARE SIGNS AND SYMPTOMS OF VASCULAR ACCESS MALFUNCTION? °· There is a change in the vibration or pulse of your fistula or graft. °· The vibration or pulse of your fistula or graft is gone.   °· There is new or unusual swelling of the area around the access.   °· There was an unsuccessful puncture of your access by the dialysis team.   °· The flow of blood through the fistula, graft, or catheter is too slow for effective dialysis.   °· When routine dialysis is completed and the needle is removed, bleeding lasts for too long a time.   °WHAT HAPPENS IF MY VASCULAR ACCESS MALFUNCTIONS? °Your health care provider may order blood work, cultures, or an X-ray test in order to learn what may be wrong with your vascular access. The X-ray test involves the injection of a liquid into the vascular access. The liquid shows up on the X-ray and allows your health care provider to see if there is a blockage in the vascular access.  °Treatment varies depending on the cause of the malfunction:   °· If the vascular access is infected, your health care provider  may prescribe antibiotic medicine to control the infection.   °· If a clot is found in the vascular access, you may need surgery to remove the clot.   °· If a blockage in the vascular access is due to some other cause (such as a kink in a graft), then you will likely need surgery to unblock or replace the graft.   °HOME CARE INSTRUCTIONS: °Follow up with your surgeon or other health care provider if you were instructed to do so. This is very important. Any delay in follow-up could cause permanent dysfunction of the vascular access, which may be dangerous.  °SEEK MEDICAL CARE IF:  °· Fever develops.   °· Swelling and pain around the vascular access gets worse or new pain develops. °· Pain, numbness, or an unusual pale skin color develops in the hand on the side of your vascular access. °SEEK IMMEDIATE MEDICAL CARE IF: °Unusual bleeding develops at the location of the vascular access. °MAKE SURE YOU: °· Understand these instructions. °· Will watch your condition. °· Will get help right away if you are not doing well or get worse. °  °This information is not intended to replace advice given to you by your health care provider. Make sure you discuss any questions you have with your health care provider. °  °Document Released: 08/11/2006 Document Revised: 09/29/2014 Document Reviewed: 02/10/2013 °Elsevier Interactive Patient Education ©2016 Elsevier Inc. ° °

## 2016-02-06 NOTE — Progress Notes (Signed)
Pre visit review using our clinic review tool, if applicable. No additional management support is needed unless otherwise documented below in the visit note. 

## 2016-02-06 NOTE — ED Notes (Addendum)
Pt to ED via [facility] with reports of dialysis fistula bleeding in right arm.  EMS reports today was pt's first dialysis treatment and was told to come to ED if bleeding occurred.  EMS reports bleeding was controlled on their arrival.

## 2016-02-06 NOTE — ED Provider Notes (Signed)
CSN: [id number]     Arrival date & time 02/06/16  2028 History   First MD Initiated Contact with Patient 02/06/16 2031     Chief Complaint  Patient presents with  . Bleeding/Bruising    HPI  Patient presents from home for bleeding dialysis fistulata He reports it occurred over the past hour He reports he had his first dialysis sessions today, without any problems and when he left dialysis center it was not bleeding He went home and later on at home it began to bleed It bled for about 15 minutes prior to EMS arrival It is now improved He has no other complaints It improved with pressure/bandaging No cp/sob No weakness   Past Medical History  Diagnosis Date  . Chronic atrial fibrillation ([place])     a. Historically difficult rate control. b. s/p CRT-D implantation with anticipation of AV junction ablation but patient then was able to accomplish rate control and ablation not undertaken.; AVN ablation 08/2013 by Dr Lovena Le  . CARDIOMYOPATHY, PRIMARY, DILATED     a. Mixed ICM/NICM - EF 15% by echo 05/2009; [employer] Jude CRT-D implanted 06/2009. b. Echo 04/2013 - EF 20%, mild LVH.  [name] Chronic systolic CHF (congestive heart failure) ([place])   . CORONARY ARTERY DISEASE     a. BMS to LAD 04/2008. b. Cath 06/2009: nonobstructive disease. c. cath 05/16/2015 DES to prox LAD  . SLEEP APNEA   . Hypertension   . Hyperlipidemia   . Diabetes mellitus   . Osteoarthritis   . Tubular adenoma of colon   . Secondary hyperparathyroidism ([place])   . CKD (chronic kidney disease), stage IV ([place])     a. Right brachiocephalic AV fistula [id number]. Neph = [place].  . Vertigo   . Valvular heart disease     a. Echo 04/2013: mild AI, mild MR.  . Pulmonary HTN ([place])     a. PA pressure 27mmHg by echo 05/01/13.  . Prostate cancer [facility])     a. s/p radical prostatectomy.  . Colon cancer ([place])   . Skin cancer   . Presence of permanent cardiac pacemaker    Past Surgical History  Procedure Laterality Date  . Colectomy    .  Prostatectomy    . Penile prosthesis placement    . Removed prosthetic eye    . Ptca      stent placed  . Total knee arthroplasty  2008    right  . Pacemaker insertion  2010  . Av fistula placement  03/30/2012    Procedure: ARTERIOVENOUS (AV) FISTULA CREATION;  Surgeon: Angelia Mould, MD;  Location: [facility];  Service: Vascular;  Laterality: Right;  Creation of BrachioCephalic Fistula Right arm  . Eye surgery      left eye prothesis  . Fracture surgery      collar bone, right knee replacement  . Bi-ventricular implantable cardioverter defibrillator  (crt-d)  2010    STJ CRTD implanted by Dr [name]  . Ablation  08/2013    AVN ablation by Dr Lovena Le  . Av node ablation N/A 09/12/2013    Procedure: AV NODE ABLATION;  Surgeon: [name], MD;  Location: [facility] CATH LAB;  Service: Cardiovascular;  Laterality: N/A;  . Insert / replace / remove pacemaker    . Joint replacement    . Cardiac catheterization N/A 05/09/2015    Procedure: Left Heart Cath and Coronary Angiography;  Surgeon: Sherren Mocha, MD;  Location: [facility] CV LAB;  Service: Cardiovascular;  Laterality: N/A;  .  Cardiac catheterization N/A 05/16/2015    Procedure: Coronary Stent Intervention;  Surgeon: Sherren Mocha, MD;  Location: [facility] CV LAB;  Service: Cardiovascular;  Laterality: N/A;   Family History  Problem Relation Age of Onset  . Heart disease Father   . Throat cancer Father   . Throat cancer Mother   . Heart disease Mother   . Hypertension Mother    Social History  Substance Use Topics  . Smoking status: Former Smoker    Types: Cigarettes    Quit date: 09/22/1966  . Smokeless tobacco: Never Used  . Alcohol Use: 0.0 - 0.6 oz/week    0-1 Standard drinks or equivalent per week     Comment: 05/07/2015 "might have a couple drinks/month"    Review of Systems  Constitutional: Negative for fever.  Cardiovascular: Negative for chest pain.  Gastrointestinal: Negative for vomiting.  Neurological:  Negative for dizziness and weakness.  All other systems reviewed and are negative.     Allergies  Ace inhibitors and Prednisone  Home Medications   Prior to Admission medications   Medication Sig Start Date End Date Taking? Authorizing Provider  acetaminophen (TYLENOL) 500 MG tablet Take 500 mg by mouth as needed for mild pain (2 tablets once daily if needed).     Historical Provider, MD  aspirin EC 81 MG tablet Take 1 tablet (81 mg total) by mouth daily. 11/09/15   Liliane Shi, PA-C  calcitRIOL (ROCALTROL) 0.25 MCG capsule Take 0.25 mcg by mouth. Reported on 02/04/2016 12/27/14   Historical Provider, MD  calcium carbonate (TUMS EX) 750 MG chewable tablet Chew 1 tablet by mouth 3 (three) times daily with meals.    Historical Provider, MD  [place] 125 MCG tablet Take 1 tablet by mouth  every other day 12/07/15   [name], MD  furosemide (LASIX) 80 MG tablet Take 1 tablet three times daily.  May take one extra tablet daily for symptoms or 3 pound weight gain. 12/25/15   [name], MD  HUMALOG MIX 75/25 (75-25) 100 UNIT/ML SUSP injection Inject subcutaneously 26  units two times daily with  meals 11/27/15   [name], MD  hydrALAZINE (APRESOLINE) 25 MG tablet Take 1 and 1/2 tablets by  mouth 3 times daily 12/14/15   [name], MD  isosorbide mononitrate (IMDUR) 30 MG 24 hr tablet Take 1 tablet by mouth  daily 12/25/15   [name], MD  metoprolol succinate (TOPROL-XL) 100 MG 24 hr tablet TAKE 1 TABLET BY MOUTH  TWICE A DAY, WITH OR  IMMEDIATELY FOLLOWING MEALS 01/10/16   [name], MD  multivitamin (RENA-VIT) TABS tablet Take 1 tablet by mouth daily.    Historical Provider, MD  nitroGLYCERIN (NITROSTAT) 0.4 MG SL tablet Place 0.4 mg under the tongue every 5 (five) minutes as needed for chest pain.    Historical Provider, MD  potassium chloride SA (K-DUR,KLOR-CON) 20 MEQ tablet Take 2 tablets (40 mEq total) by mouth daily. 06/06/15   Erma Heritage, PA  warfarin (COUMADIN) 5 MG tablet Take 2.5 mg (one-half tablet) by mouth Mondays, and one whole tablet by mouth al other days (tue, Wed, Thur, Fri, Sat, Sun) 12/10/15   [name], MD   BP 149/66 mmHg  Pulse 74  Temp(Src) 98.4 F (36.9 C) (Oral)  Resp 18  SpO2 98% Physical Exam CONSTITUTIONAL: Elderly, no distress HEAD: Normocephalic/atraumatic EYES: EOMI ENMT: Mucous membranes moist NECK: supple no meningeal signs CV: S1/S2 noted,  murmur noted LUNGS: Lungs are clear to auscultation bilaterally ABDOMEN: soft, nontender NEURO: Pt is awake/alert/appropriate, moves all extremitiesx4.  EXTREMITIES: pulses normal/equal, full ROM Dialysis fistula to right UE.  It is bandaged.  No active bleeding.  Distal pulses intact Thrill noted to right fistula SKIN: warm, color normal PSYCH: no abnormalities of mood noted, alert and oriented to situation  ED Course  Procedures  Pt is on coumadin but already INR check today in EPIC at 3.6 Will check HGB He is not actively bleeding at this time    Pt stable in the ED No further bleeding  Bandage changed by myself and no active bleeding Labs reassuring I feel he is appropriate for d/c home Labs Review Labs Reviewed  CBC - Abnormal; Notable for the following:    RBC 3.69 (*)    Hemoglobin 11.4 (*)    HCT 34.4 (*)    Platelets 101 (*)    All other components within normal limits    I have personally reviewed and evaluated these lab results as part of my medical decision-making.    MDM   Final diagnoses:  Dialysis AV fistula malfunction, initial encounter [facility])    Nursing notes including past medical history and social history reviewed and considered in documentation Labs/vital reviewed myself and considered during evaluation Previous records reviewed and considered     Ripley Fraise, MD 02/07/16 [phone]

## 2016-02-06 NOTE — Progress Notes (Signed)
I have reviewed and agree with the plan. 

## 2016-02-06 NOTE — ED Notes (Signed)
Pt resting at this time.  No complaints voiced.  Skin warm and dry, color appropriate

## 2016-02-08 LAB — POCT INR: INR: 1.9

## 2016-02-11 ENCOUNTER — Ambulatory Visit (INDEPENDENT_AMBULATORY_CARE_PROVIDER_SITE_OTHER): Payer: Medicare Other | Admitting: *Deleted

## 2016-02-11 ENCOUNTER — Ambulatory Visit (INDEPENDENT_AMBULATORY_CARE_PROVIDER_SITE_OTHER): Payer: Medicare Other | Admitting: General Practice

## 2016-02-11 DIAGNOSIS — I482 Chronic atrial fibrillation, unspecified: Secondary | ICD-10-CM

## 2016-02-11 DIAGNOSIS — Z5181 Encounter for therapeutic drug level monitoring: Secondary | ICD-10-CM

## 2016-02-11 DIAGNOSIS — Z4502 Encounter for adjustment and management of automatic implantable cardiac defibrillator: Secondary | ICD-10-CM

## 2016-02-11 NOTE — Progress Notes (Signed)
Remote ICD transmission.   

## 2016-02-11 NOTE — Progress Notes (Signed)
Pre visit review using our clinic review tool, if applicable. No additional management support is needed unless otherwise documented below in the visit note. 

## 2016-02-29 ENCOUNTER — Other Ambulatory Visit: Payer: Self-pay | Admitting: *Deleted

## 2016-02-29 NOTE — Patient Outreach (Signed)
[facility]) Care Management  02/29/2016  [name] [name] 10/31/1930 [id number]  Assessment: Care coordination call Received an in-basket message from care management assistant Markham Jordan) that patient called to reschedule home visit that was scheduled for next week (6/14) since he has dialysis on Wednesdays. Call placed to speak with patient on his home phone number but unable to reach him. HIPAA compliant voice message left with name and contact number. Attempted to reach patient through his mobile phone but automated voice states that number had been changed, disconnected or is no longer in service. Unable to leave a message on this phone number for patient.   Plan: Will await for patient's return call. If unable to receive a call back, will attempt to call patient early part next week.  Sonya Gunnoe A. Florenda Watt, BSN, RN-BC [facility] Management Coordinator Cell: [phone]

## 2016-03-02 LAB — CUP PACEART REMOTE DEVICE CHECK
Battery Remaining Longevity: 6 mo
Date Time Interrogation Session: 20170522094215
HighPow Impedance: 41 Ohm
Implantable Lead Implant Date: 20101028
Implantable Lead Location: 753860
Implantable Lead Model: 4196
Implantable Lead Model: 7121
Lead Channel Impedance Value: 400 Ohm
Lead Channel Pacing Threshold Amplitude: 3.5 V
Lead Channel Pacing Threshold Pulse Width: 0.6 ms
Lead Channel Setting Pacing Amplitude: 5 V
MDC IDC LEAD IMPLANT DT: 20101028
MDC IDC LEAD LOCATION: 753858
MDC IDC MSMT BATTERY REMAINING PERCENTAGE: 14 %
MDC IDC MSMT BATTERY VOLTAGE: 2.72 V
MDC IDC MSMT LEADCHNL LV PACING THRESHOLD PULSEWIDTH: 1 ms
MDC IDC MSMT LEADCHNL RV IMPEDANCE VALUE: 380 Ohm
MDC IDC MSMT LEADCHNL RV PACING THRESHOLD AMPLITUDE: 1 V
MDC IDC MSMT LEADCHNL RV SENSING INTR AMPL: 12 mV
MDC IDC PG SERIAL: 595859
MDC IDC SET LEADCHNL LV PACING PULSEWIDTH: 1 ms
MDC IDC SET LEADCHNL RV PACING AMPLITUDE: 2.5 V
MDC IDC SET LEADCHNL RV PACING PULSEWIDTH: 0.6 ms
MDC IDC SET LEADCHNL RV SENSING SENSITIVITY: 0.5 mV

## 2016-03-03 ENCOUNTER — Other Ambulatory Visit: Payer: Self-pay | Admitting: *Deleted

## 2016-03-03 NOTE — Patient Outreach (Signed)
[facility]) Care Management  03/03/2016  [name] [name]. 04/22/1931 [id number]   Assessment: Care coordination call (2nd attempt)  Unable to receive a call back from patient to message left last Friday (6/9) on his home phone voicemail.  Call placed to patient through his mobile phone but automated response states that number had been changed, disconnected or is no longer in service and was unable to leave a message.  Call placed to home phone to follow-up with patient regarding request to reschedule home visit previously scheduled on Wednesday (6/14) but unable to reach him. HIPAA compliant voice message left with name and contact information.  Plan: Will await for patient's return call. If unable to receive a call back, will attempt to call emergency contact [name] [name]) to assist in contacting patient to reschedule appointment.    Call placed and spoke to stepdaughter/ emergency contact [name] [name]). Identity verified and explained purpose of the call.  Ms. [name] [name] notified care management coordinator that patient is finishing up dialysis right now.  Patient started dialysis about couple of weeks ago on Monday-Wednesday-Friday schedule as stated.. Ms. [name] [name] also informed care management coordinator that patient's cell phone number is no longer in use. She reports that patient is now using his home phone number as his cell phone number. She suggested to try calling patient back tomorrow and she will let patient know that care management coordinator had called. She states that patient is only available on Tuesdays and Thursdays now.   Plan: Will contact patient tomorrow (6/13) to follow-up and reschedule home visit.     [name] [name], BSN, RN-BC [place] Management Coordinator Cell: [phone]

## 2016-03-04 ENCOUNTER — Other Ambulatory Visit: Payer: Self-pay | Admitting: *Deleted

## 2016-03-04 ENCOUNTER — Encounter: Payer: Self-pay | Admitting: *Deleted

## 2016-03-04 NOTE — Patient Outreach (Signed)
[place] [facility]) Care Management  03/04/2016  [name] [name]. 07-10-1931 [id number]   Assessment: Care coordination call (3rd attempt) Called and spoke with patient with report that he had started on dialysis the week after Mother's Day. Patient states that his dialysis schedule is M-W-F  from 11 am - 6 pm which made him unable to answer care management coordinator's previous calls. He states being available on Tuesdays and Thursdays only and he was very willing to reschedule routine home visit to today instead of tomorrow.  Scheduled home visit for tomorrow was cancelled and rescheduled for today instead.  Plan: Will do home visit today per patient's request.  Edwena Felty A. Dorr Perrot, BSN, RN-BC [place] Management Coordinator Cell: [phone]

## 2016-03-04 NOTE — Patient Outreach (Signed)
[place] [facility]) Care Management   03/04/2016  Adwolf [birth year] [id number]  [name] [name]. is an 82 y.o. male  Subjective: Patient reports "I had to start dialysis on 5/17 and I am tied up with dialysis Monday-Wednesday-Friday". He states he "feels a lot better and feels more energized". Patient verbalized that dialysis gave him more energy and positive attitude.  Objective: BP 126/70 mmHg  Pulse 75  Resp 20  SpO2 98%   Review of Systems  Constitutional: Negative.   HENT: Positive for hearing loss.        Slight hearing loss- hearing aid not needed per patient  Eyes: Negative.        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 Negative for cough, sputum production and wheezing.        Shortness of breath on physical exertion Lung sounds diminished otherwise clear to auscultation Respirations even and unlabored Sleep apnea-  CPAP use  Cardiovascular: Negative.  Negative for chest pain and leg swelling.       Has pacemaker Regular rate and rhythm  Gastrointestinal: Positive for constipation. Negative for nausea, vomiting, abdominal pain and diarrhea.       History of constipation- on Colace Bowel sounds present Abdomen soft and non-tender  Genitourinary: Negative.   Musculoskeletal: Negative.  Negative for falls.  Skin: Negative.   Neurological: Negative.   Endo/Heme/Allergies: Bruises/bleeds easily.       Large bruised spots to right arm (fistula site)  Psychiatric/Behavioral: Negative.     Physical Exam  Encounter Medications:   Outpatient Encounter Prescriptions as of 03/04/2016  Medication Sig Note  . acetaminophen (TYLENOL) 500 MG tablet Take 500 mg by mouth as needed for mild pain (2 tablets once daily if needed).    [name] aspirin EC 81 MG tablet Take 1 tablet (81 mg total) by mouth daily.   . calcium carbonate (TUMS EX) 750 MG chewable tablet Chew  1 tablet by mouth 3 (three) times daily with meals.   [name] DIGOX 125 MCG tablet Take 1 tablet by mouth  every other day   . docusate sodium (COLACE) 100 MG capsule Take 300 mg by mouth daily.   [name] HUMALOG MIX 75/25 (75-25) 100 UNIT/ML SUSP injection Inject subcutaneously 26  units two times daily with  meals   . hydrALAZINE (APRESOLINE) 25 MG tablet Take 1 and 1/2 tablets by  mouth 3 times daily 03/04/2016: Taking 1 1/2 tablets 3 times a day on Tues-Thurs-Sat-Sun and  Taking 1 1/2 tablets 2 times a day on M-W-F (Dialysis days)   . isosorbide mononitrate (IMDUR) 30 MG 24 hr tablet Take 1 tablet by mouth  daily   . metoprolol succinate (TOPROL-XL) 100 MG 24 hr tablet TAKE 1 TABLET BY MOUTH  TWICE A DAY, WITH OR  IMMEDIATELY FOLLOWING MEALS   . multivitamin (RENA-VIT) TABS tablet Take 1 tablet by mouth daily.   . nitroGLYCERIN (NITROSTAT) 0.4 MG SL tablet Place 0.4 mg under the tongue every 5 (five) minutes as needed for chest pain.   [name] warfarin (COUMADIN) 5 MG tablet Take 2.5 mg (one-half tablet) by mouth Mondays, and one whole tablet by mouth al other days (tue, Wed, Thur, Fri, Sat, Sun)   . calcitRIOL (ROCALTROL) 0.25 MCG capsule Take 0.25 mcg by mouth. Reported on 03/04/2016 06/04/2015: .   . furosemide (LASIX) 80 MG tablet Take 1 tablet three times daily.  May take one extra tablet daily for symptoms or 3 pound weight gain. (Patient not taking: Reported on 03/04/2016) 02/06/2016: -  . potassium chloride SA (K-DUR,KLOR-CON) 20 MEQ tablet Take 2 tablets (40 mEq total) by mouth daily. (Patient not taking: Reported on 03/04/2016)    No facility-administered encounter medications on file as of 03/04/2016.    Functional Status:   In your present state of health, do you have any difficulty performing the following activities: 03/04/2016 02/04/2016  Hearing? N N  Vision? N N  Difficulty concentrating or making decisions? N N  Walking or climbing stairs? N N  Dressing or bathing? N N  Doing errands, shopping? N  N  Preparing Food and eating ? N N  Using the Toilet? N N  In the past six months, have you accidently leaked urine? N N  Do you have problems with loss of bowel control? N N  Managing your Medications? N N  Managing your Finances? N N  Housekeeping or managing your Housekeeping? N N    Fall/Depression Screening:    PHQ 2/9 Scores 03/04/2016 02/04/2016 12/31/2015 12/27/2015 11/12/2015 10/12/2015 09/07/2015  PHQ - 2 Score 0 0 1 1 0 0 0    Assessment:   Arrived at patient's home. Patient with bright affect and lively. He lives alone with his dog. Patient denies having signs and symptoms HF flare-up. He considers himself under "green zone" and expresses understanding to call provider when moving in to yellow zone and to call 911 when in the red zone. Patient is knowledgeable of the different steps in managing HF such as monitoring weight gain 3 pounds in a day or 5 pounds in a week; using HF zone tool to know how he feels everyday, taking medications as prescribed; maintaining diet restrictions and continuing to exercise. Patient states he will resume exercises at the [facility] once stable on dialysis. In the meantime, he continues with his daily walk with his dog.   Patient is currently receiving dialysis at [facility] [place].   His weight is monitored before and after dialysis with most recent weight of 172.3 pounds as stated. He is now off Lasix and Potassium as ordered by kidney doctor and no swelling to lower extremities noted. Patient remains with good appetite and maintains on low sodium, diabetic, heart healthy diet with increase in protein intake (cheese snacks, egg, chicken) as reported. He is being followed-up by dialysis dietitian, kidney doctor (Dr. Eliott Nine) and PA on a weekly basis. He also has Child psychotherapist access at dialysis center if needed. Patient reports driving himself to and from dialysis without problem. He states that stepdaughter Casimer Bilis) is able to provide transportation in  case he needs it.  Patient is awaiting for an appointment made by dialysis center to be seen by a surgeon to check on his fistula which was not fully working.   Patient's diabetes remains stable with insulin (Humalog - self administered), diet and exercise. His blood pressure readings are running lower now with dialysis as stated. He continues to manage his own medications and has his complete supplies except for multivitamins (Rena-Vit) which he is aware to obtain a refill from the pharmacy.  Patient denies identifying any additional needs at this time. He states "I'm getting all the help that I need from the dialysis center". Patient's goals were met. He declined offer to be transitioned to [facility] for further disease management. He agreed to case closure and notify primary care provider of such.  Patient  is aware of [facility] contact information if further or future needs will arise.    Plan: Will close case with goals met. Will notify primary care provider of case closure.     [facility] CM Care Plan Problem One        Most Recent Value   Care Plan Problem One  at risk for readmission   Role Documenting the Problem One  Care Management [place] for Problem One  Not Active   [facility] Long Term Goal (31-90 days)  patient will not be readmitted in the next 31 days   [facility] Long Term Goal Start Date  12/31/15   [facility] Long Term Goal Met Date  02/04/16 [Goal met- no readmission]   [facility] CM Short Term Goal #1 (0-30 days)  patient will check weight daily and record in the next 30 days   [facility] CM Short Term Goal #1 Start Date  12/31/15 [facility] met-weight check QD & recorded on calendar if in town]   [facility] CM Short Term Goal #1 Met Date  02/04/16 [facility] met-weight check QD & recorded on calendar if in town]   [facility] CM Short Term Goal #2 (0-30 days)  patient will continue to take medications as prescribed in the next 30 days   [facility] CM Short Term Goal #2 Start Date  12/31/15   [facility] CM Short Term Goal #2  Met Date  02/04/16 [Goal met- cont.to take medications-Lasix adjustments ordered]   [facility] CM Short Term Goal #3 (0-30 days)  patient will verbalize 5 steps in managing heart failure or living life better with heart failure in the next 30 days    [facility] CM Short Term Goal #3 Start Date  02/04/16   [facility] CM Short Term Goal #3 Met Date  03/04/16 [Goal met]    [facility] CM Care Plan Problem Two        Most Recent Value   Care Plan Problem Two  Failure of kidney to eliminate excess fluid from the body with dialysis option per kidney doctor    Role Documenting the Problem Two  Care Management [place] for Problem Two  Not Active   [facility] CM Short Term Goal #1 (0-30 days)  Patient will verbalize being prepared for dialysis as his option with in the next 30 days   [facility] CM Short Term Goal #1 Start Date  02/04/16   [facility] CM Short Term Goal #1 Met Date   03/04/16 [Patient started on dialysis M-W-F since 02/06/16]     Avyon Herendeen A. Freddi Schrager, BSN, RN-BC [place] Management Coordinator Cell: [phone]

## 2016-03-05 ENCOUNTER — Ambulatory Visit: Payer: Self-pay | Admitting: *Deleted

## 2016-03-05 ENCOUNTER — Encounter: Payer: Self-pay | Admitting: *Deleted

## 2016-03-06 ENCOUNTER — Encounter: Payer: Self-pay | Admitting: Cardiology

## 2016-03-12 ENCOUNTER — Ambulatory Visit (INDEPENDENT_AMBULATORY_CARE_PROVIDER_SITE_OTHER): Payer: Medicare Other | Admitting: Podiatry

## 2016-03-12 ENCOUNTER — Encounter: Payer: Self-pay | Admitting: Podiatry

## 2016-03-12 VITALS — Ht 71.0 in | Wt 175.0 lb

## 2016-03-12 DIAGNOSIS — B351 Tinea unguium: Secondary | ICD-10-CM

## 2016-03-12 DIAGNOSIS — M79606 Pain in leg, unspecified: Secondary | ICD-10-CM | POA: Diagnosis not present

## 2016-03-12 DIAGNOSIS — E0842 Diabetes mellitus due to underlying condition with diabetic polyneuropathy: Secondary | ICD-10-CM

## 2016-03-12 NOTE — Patient Instructions (Signed)
Seen for hypertrophic nails. All nails debrided. Return in 3 months or as needed.  

## 2016-03-12 NOTE — Progress Notes (Signed)
Subjective:  82 y.o. year old male IDDM presents with painful ingrown nails.  Stated that as long as he gets them trimmed regularly he is fine. He used to have problem with ingrown nails all the time.  He has started on Kidney Dialysis.  Objective:  Dermatologic:  Thick dystrophic nails with ingrown nail on both great toes. Vascular:  Dorsalis pedis pulses palpable on both feet.  Posterior tibial pulses are not palpable on both feet.  No edema or erythema, no ischemic changes noted.  Orthopedic:  Cavus type foot without other gross deformity.  Neurologic:  Failed respond to Monofilament sensory testing bilateral. Vibratory and Ankle DTR is within normal bilateral.   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 Diabetic neuropathy difficulty. Pes cavus. On Kidney Dialysis.  Treatment: All nails debrided. Ingrown nails reduced.

## 2016-03-13 ENCOUNTER — Ambulatory Visit (INDEPENDENT_AMBULATORY_CARE_PROVIDER_SITE_OTHER): Payer: Medicare Other | Admitting: *Deleted

## 2016-03-13 DIAGNOSIS — I420 Dilated cardiomyopathy: Secondary | ICD-10-CM

## 2016-03-13 DIAGNOSIS — Z4502 Encounter for adjustment and management of automatic implantable cardiac defibrillator: Secondary | ICD-10-CM | POA: Diagnosis not present

## 2016-03-13 NOTE — Progress Notes (Signed)
Remote ICD transmission.   

## 2016-03-14 ENCOUNTER — Encounter: Payer: Self-pay | Admitting: Cardiovascular Disease

## 2016-03-14 ENCOUNTER — Ambulatory Visit (INDEPENDENT_AMBULATORY_CARE_PROVIDER_SITE_OTHER): Payer: Medicare Other | Admitting: Cardiovascular Disease

## 2016-03-14 VITALS — BP 150/66 | HR 80 | Ht 71.0 in | Wt 177.1 lb

## 2016-03-14 DIAGNOSIS — I5022 Chronic systolic (congestive) heart failure: Secondary | ICD-10-CM | POA: Diagnosis not present

## 2016-03-14 DIAGNOSIS — I2510 Atherosclerotic heart disease of native coronary artery without angina pectoris: Secondary | ICD-10-CM | POA: Diagnosis not present

## 2016-03-14 NOTE — Progress Notes (Signed)
Cardiology Office Note Date:  03/14/2016   ID:  [name] Boghosian., DOB 1931/08/06, MRN [id number]  PCP:  Nyoka Cowden, MD  Cardiologist:  Sherren Mocha, MD    Chief Complaint  Patient presents with  . Coronary Artery Disease    C/O sob which remains the same as last visit. Denies any cp,claudication,lee  . Essential Hypertension     History of Present Illness: [name] Rilley. is a 82 y.o. male who presents for follow-up evaluation. The patient has coronary artery disease and chronic systolic heart failure. He originally underwent LAD stenting in 2009. Other comorbid conditions include atrial fibrillation and end-stage renal disease. In August 2016 he presented with progressive dyspnea and underwent cardiac catheterization demonstrating severe proximal LAD stenosis. He was treated with PCI. Urinalysis on combination and aspirin 81 mg. His kidney disease progressed and he has started hemodialysis approximately 1 month ago. He feels much better since starting dialysis with increased energy and improved breathing. He has developed some postural lightheadedness at times. No chest pain or pressure. His edema has resolved. No other complaints today.   Past Medical History  Diagnosis Date  . Chronic atrial fibrillation ([place])     a. Historically difficult rate control. b. s/p CRT-D implantation with anticipation of AV junction ablation but patient then was able to accomplish rate control and ablation not undertaken.; AVN ablation 08/2013 by Dr Lovena Le  . CARDIOMYOPATHY, PRIMARY, DILATED     a. Mixed ICM/NICM - EF 15% by echo 05/2009; St. Jude CRT-D implanted 06/2009. b. Echo 04/2013 - EF 20%, mild LVH.  [name] Chronic systolic CHF (congestive heart failure) ([place])   . CORONARY ARTERY DISEASE     a. BMS to LAD 04/2008. b. Cath 06/2009: nonobstructive disease. c. cath 05/16/2015 DES to prox LAD  . SLEEP APNEA   . Hypertension   . Hyperlipidemia   . Diabetes mellitus   . Osteoarthritis    . Tubular adenoma of colon   . Secondary hyperparathyroidism ([place])   . CKD (chronic kidney disease), stage IV ([place])     a. Right brachiocephalic AV fistula [id number]. Neph = [place].  . Vertigo   . Valvular heart disease     a. Echo 04/2013: mild AI, mild MR.  . Pulmonary HTN ([place])     a. PA pressure 17mmHg by echo 05/01/13.  . Prostate cancer [facility])     a. s/p radical prostatectomy.  . Colon cancer ([place])   . Skin cancer   . Presence of permanent cardiac pacemaker     Past Surgical History  Procedure Laterality Date  . Colectomy    . Prostatectomy    . Penile prosthesis placement    . Removed prosthetic eye    . Ptca      stent placed  . Total knee arthroplasty  2008    right  . Pacemaker insertion  2010  . Av fistula placement  03/30/2012    Procedure: ARTERIOVENOUS (AV) FISTULA CREATION;  Surgeon: Angelia Mould, MD;  Location: [place];  Service: Vascular;  Laterality: Right;  Creation of BrachioCephalic Fistula Right arm  . Eye surgery      left eye prothesis  . Fracture surgery      collar bone, right knee replacement  . Bi-ventricular implantable cardioverter defibrillator  (crt-d)  2010    STJ CRTD implanted by Dr [name]  . Ablation  08/2013    AVN ablation by Dr Lovena Le  . Av node ablation N/A 09/12/2013  Procedure: AV NODE ABLATION;  Surgeon: [name], MD;  Location: [facility] CATH LAB;  Service: Cardiovascular;  Laterality: N/A;  . Insert / replace / remove pacemaker    . Joint replacement    . Cardiac catheterization N/A 05/09/2015    Procedure: Left Heart Cath and Coronary Angiography;  Surgeon: Sherren Mocha, MD;  Location: [place] CV LAB;  Service: Cardiovascular;  Laterality: N/A;  . Cardiac catheterization N/A 05/16/2015    Procedure: Coronary Stent Intervention;  Surgeon: Sherren Mocha, MD;  Location: [place] CV LAB;  Service: Cardiovascular;  Laterality: N/A;    Current Outpatient Prescriptions  Medication Sig Dispense Refill  . acetaminophen  (TYLENOL) 500 MG tablet Take 500 mg by mouth as needed for mild pain (2 tablets once daily if needed).     [name] aspirin EC 81 MG tablet Take 1 tablet (81 mg total) by mouth daily. 90 tablet 3  . calcium carbonate (TUMS EX) 750 MG chewable tablet Chew 1 tablet by mouth 3 (three) times daily with meals.    [name] DIGOX 125 MCG tablet Take 1 tablet by mouth  every other day 45 tablet 1  . docusate sodium (COLACE) 100 MG capsule Take 300 mg by mouth daily.    [name] HUMALOG MIX 75/25 (75-25) 100 UNIT/ML SUSP injection Inject subcutaneously 26  units two times daily with  meals 5 vial 3  . hydrALAZINE (APRESOLINE) 25 MG tablet Take 37.5 mg by mouth as directed. Take 37.5mg  (1.5tab) by mouth three times a day on Sundays,tuesdays,Thursdays, and Saturdays.  Take 37.5mg  (1.5tabs) by mouth on Mondays,Wed,and Fridays    . isosorbide mononitrate (IMDUR) 30 MG 24 hr tablet Take 1 tablet by mouth  daily 90 tablet 3  . metoprolol succinate (TOPROL-XL) 100 MG 24 hr tablet TAKE 1 TABLET BY MOUTH  TWICE A DAY, WITH OR  IMMEDIATELY FOLLOWING MEALS 180 tablet 3  . multivitamin (RENA-VIT) TABS tablet Take 1 tablet by mouth daily.    . nitroGLYCERIN (NITROSTAT) 0.4 MG SL tablet Place 0.4 mg under the tongue every 5 (five) minutes as needed for chest pain.    [name] warfarin (COUMADIN) 5 MG tablet Take 2.5 mg (one-half tablet) by mouth Mondays, and one whole tablet by mouth al other days (tue, Wed, Thur, Fri, Sat, Sun) 90 tablet 1   No current facility-administered medications for this visit.    Allergies:   Ace inhibitors and Prednisone   Social History:  The patient  reports that he quit smoking about 49 years ago. His smoking use included Cigarettes. He has never used smokeless tobacco. He reports that he drinks alcohol. He reports that he does not use illicit drugs.   Family History:  The patient's  family history includes Heart disease in his father and mother; Hypertension in his mother; Throat cancer in his father and mother.     ROS:  Please see the history of present illness.   All other systems are reviewed and negative.    PHYSICAL EXAM: VS:  BP 150/66 mmHg  Pulse 80  Ht 5\' 11"  (1.803 m)  Wt 177 lb 1.9 oz (80.341 kg)  BMI 24.71 kg/m2 , BMI Body mass index is 24.71 kg/(m^2). GEN: Well nourished, well developed, in no acute distress HEENT: normal Neck: no JVD, no masses. Bilateral carotid bruits Cardiac: RRR with 2/6 systolic ejection murmur at the LSB, and continuous murmur at the LUSB (related to AV fistula)    Respiratory:  clear to auscultation bilaterally, normal work of breathing  GI: soft, nontender, nondistended, + BS MS: no deformity or atrophy Ext: no pretibial edema Skin: warm and dry, no rash Neuro:  Strength and sensation are intact Psych: euthymic mood, full affect  EKG:  EKG is not ordered today.  Recent Labs: 06/04/2015: ALT 19; TSH 0.925 12/18/2015: B Natriuretic Peptide 981.0*; Magnesium 2.4 12/19/2015: BUN 74*; Creatinine, Ser 5.13*; Potassium 3.4*; Sodium 142 02/06/2016: Hemoglobin 11.4*; Platelets 101*   Lipid Panel     Component Value Date/Time   CHOL 172 05/08/2015 0535   TRIG 211* 05/08/2015 0535   TRIG 136 08/17/2006 0807   HDL 25* 05/08/2015 0535   CHOLHDL 6.9 05/08/2015 0535   CHOLHDL 3.6 CALC 08/17/2006 0807   VLDL 42* 05/08/2015 0535   LDLCALC 105* 05/08/2015 0535   LDLDIRECT 102.7 08/03/2012 0929   LDLDIRECT 123.1 07/06/2006 0837      Wt Readings from Last 3 Encounters:  03/14/16 177 lb 1.9 oz (80.341 kg)  03/12/16 175 lb (79.379 kg)  12/25/15 175 lb (79.379 kg)     Cardiac Studies Reviewed: 05-16-2015: Conclusion     Ost LAD to Prox LAD lesion, 75% stenosed. There is a 0% residual stenosis post intervention.  A drug-eluting stent was placed.  Successful PCI of the proximal LAD with drug-eluting stent implantation and scoring balloon angioplasty  Recommend Plavix 75 mg for at least 6 months. Warfarin will be restarted tonight.    05-09-2015: Conclusion     Mid RCA lesion, 50% stenosed.  Prox LAD lesion, 75% stenosed.  Ost 1st Diag to 1st Diag lesion, 80% stenosed.  Prox LAD to Mid LAD lesion, 25% stenosed. A bare metal stent was placed. The lesion was previously treated with a bare metal stent .  1. SEVERE PROXIMAL LAD STENOSIS 2. SEVERE DIAGONAL BRANCH STENOSIS (SMALL VESSEL) 3. NONOBSTRUCTIVE LCX AND RCA STENOSES 4. PRESERVED CARDIAC OUTPUT AND WELL-COMPENSATED HEMODYNAMICS  RECOMMEND: STAGED PCI OF THE LAD (STAGED PROCEDURE BECAUSE OF ADVANCED KIDNEY DISEASE). WHILE THE PROXIMAL LAD HAS SIGNIFICANT STENOSIS, THE LESION APPEARS NON-CRITICAL AND IT MAY BE BEST TO STAGE THE PCI PROCEDURE FOR APPROXIMATELY 1 WEEK TO MINIMIZE RENAL RISK. WOULD HOLD WARFARIN AND START PLAVIX. RESUME WARFARIN FOLLOWING PCI.    ASSESSMENT AND PLAN: 1.  Coronary artery disease, native vessel, without symptoms of angina: He remains on antiplatelet therapy with low-dose aspirin in the context of chronic anticoagulation with warfarin. The patient appears to be doing well at the present time.  2. Chronic systolic heart failure, NYHA functional class II: Point management with dialysis. The patient is tolerating digoxin every other day along with metoprolol succinate and isosorbide. His hydralazine dose has been decreased. He may need further dose reductions in his antihypertensive medicines with intermittent dizziness now he is on dialysis. Will defer to nephrology.  3. Hypertension with renal failure: Discussion as above  4. Paroxysmal atrial fibrillation: Treated with warfarin for anticoagulation.  Current medicines are reviewed with the patient today.  The patient does not have concerns regarding medicines.  Labs/ tests ordered today include:  No orders of the defined types were placed in this encounter.    Disposition:   FU one year  Signed, Sherren Mocha, MD  03/14/2016 3:46 PM    [address], [place], [place]  [address] Phone: [phone]; Fax: [phone]

## 2016-03-14 NOTE — Patient Instructions (Signed)

## 2016-03-18 LAB — CUP PACEART REMOTE DEVICE CHECK
Date Time Interrogation Session: 20170622075156
HIGH POWER IMPEDANCE MEASURED VALUE: 42 Ohm
Implantable Lead Implant Date: 20101028
Implantable Lead Location: 753858
Implantable Lead Model: 4196
Implantable Lead Model: 7121
Lead Channel Impedance Value: 480 Ohm
Lead Channel Sensing Intrinsic Amplitude: 12 mV
Lead Channel Setting Pacing Amplitude: 2.5 V
Lead Channel Setting Pacing Pulse Width: 1 ms
Lead Channel Setting Sensing Sensitivity: 0.5 mV
MDC IDC LEAD IMPLANT DT: 20101028
MDC IDC LEAD LOCATION: 753860
MDC IDC MSMT BATTERY REMAINING LONGEVITY: 6 mo
MDC IDC MSMT BATTERY REMAINING PERCENTAGE: 13 %
MDC IDC MSMT BATTERY VOLTAGE: 2.71 V
MDC IDC MSMT LEADCHNL LV IMPEDANCE VALUE: 430 Ohm
MDC IDC PG SERIAL: 595859
MDC IDC SET LEADCHNL LV PACING AMPLITUDE: 5 V
MDC IDC SET LEADCHNL RV PACING PULSEWIDTH: 0.6 ms

## 2016-03-20 LAB — PROTIME-INR: INR: 3 — AB (ref <=1.1)

## 2016-03-21 ENCOUNTER — Ambulatory Visit (INDEPENDENT_AMBULATORY_CARE_PROVIDER_SITE_OTHER): Payer: Medicare Other | Admitting: General Practice

## 2016-03-21 DIAGNOSIS — I482 Chronic atrial fibrillation, unspecified: Secondary | ICD-10-CM

## 2016-03-21 DIAGNOSIS — Z5181 Encounter for therapeutic drug level monitoring: Secondary | ICD-10-CM

## 2016-03-21 NOTE — Progress Notes (Signed)
I have reviewed and agree with the plan. 

## 2016-03-21 NOTE — Progress Notes (Signed)
Pre visit review using our clinic review tool, if applicable. No additional management support is needed unless otherwise documented below in the visit note. 

## 2016-03-28 ENCOUNTER — Encounter: Payer: Self-pay | Admitting: Cardiology

## 2016-04-14 ENCOUNTER — Ambulatory Visit (INDEPENDENT_AMBULATORY_CARE_PROVIDER_SITE_OTHER): Payer: Medicare Other | Admitting: *Deleted

## 2016-04-14 ENCOUNTER — Ambulatory Visit: Payer: Medicare Other | Admitting: Internal Medicine

## 2016-04-14 ENCOUNTER — Other Ambulatory Visit: Payer: Self-pay | Admitting: Vascular Surgery

## 2016-04-14 DIAGNOSIS — Z4502 Encounter for adjustment and management of automatic implantable cardiac defibrillator: Secondary | ICD-10-CM

## 2016-04-14 DIAGNOSIS — T82510A Breakdown (mechanical) of surgically created arteriovenous fistula, initial encounter: Secondary | ICD-10-CM

## 2016-04-14 NOTE — Progress Notes (Signed)
Remote ICD transmission.   

## 2016-04-15 ENCOUNTER — Encounter: Payer: Self-pay | Admitting: Internal Medicine

## 2016-04-16 ENCOUNTER — Encounter: Payer: Self-pay | Admitting: Cardiology

## 2016-04-16 LAB — CUP PACEART REMOTE DEVICE CHECK
Battery Remaining Longevity: 5 mo
Battery Remaining Percentage: 11 %
Date Time Interrogation Session: 20170724080227
HIGH POWER IMPEDANCE MEASURED VALUE: 45 Ohm
Implantable Lead Implant Date: 20101028
Implantable Lead Location: 753858
Implantable Lead Model: 4196
Lead Channel Impedance Value: 430 Ohm
Lead Channel Impedance Value: 480 Ohm
Lead Channel Pacing Threshold Amplitude: 3.5 V
Lead Channel Pacing Threshold Pulse Width: 1 ms
Lead Channel Setting Pacing Amplitude: 2.5 V
Lead Channel Setting Pacing Amplitude: 5 V
Lead Channel Setting Pacing Pulse Width: 0.6 ms
Lead Channel Setting Pacing Pulse Width: 1 ms
MDC IDC LEAD IMPLANT DT: 20101028
MDC IDC LEAD LOCATION: 753860
MDC IDC LEAD MODEL: 7121
MDC IDC MSMT BATTERY VOLTAGE: 2.71 V
MDC IDC MSMT LEADCHNL RV PACING THRESHOLD AMPLITUDE: 1 V
MDC IDC MSMT LEADCHNL RV PACING THRESHOLD PULSEWIDTH: 0.6 ms
MDC IDC MSMT LEADCHNL RV SENSING INTR AMPL: 10.5 mV
MDC IDC SET LEADCHNL RV SENSING SENSITIVITY: 0.5 mV
Pulse Gen Serial Number: 595859

## 2016-04-17 LAB — PROTIME-INR: INR: 1.7 — AB (ref <=1.1)

## 2016-04-18 ENCOUNTER — Ambulatory Visit (INDEPENDENT_AMBULATORY_CARE_PROVIDER_SITE_OTHER): Payer: Medicare Other | Admitting: General Practice

## 2016-04-18 ENCOUNTER — Encounter: Payer: Self-pay | Admitting: Vascular Surgery

## 2016-04-18 DIAGNOSIS — Z5181 Encounter for therapeutic drug level monitoring: Secondary | ICD-10-CM

## 2016-04-18 DIAGNOSIS — I482 Chronic atrial fibrillation, unspecified: Secondary | ICD-10-CM

## 2016-04-18 NOTE — Progress Notes (Signed)
I have reviewed and agree with the plan. 

## 2016-04-24 ENCOUNTER — Ambulatory Visit (INDEPENDENT_AMBULATORY_CARE_PROVIDER_SITE_OTHER): Payer: Medicare Other | Admitting: Vascular Surgery

## 2016-04-24 ENCOUNTER — Encounter: Payer: Self-pay | Admitting: *Deleted

## 2016-04-24 ENCOUNTER — Ambulatory Visit (HOSPITAL_COMMUNITY)
Admission: RE | Admit: 2016-04-24 | Discharge: 2016-04-24 | Disposition: A | Discharge status: Home / Self Care | Payer: Medicare Other | Source: Ambulatory Visit | Attending: Vascular Surgery | Admitting: Vascular Surgery

## 2016-04-24 ENCOUNTER — Encounter: Payer: Self-pay | Admitting: Vascular Surgery

## 2016-04-24 ENCOUNTER — Other Ambulatory Visit: Payer: Self-pay | Admitting: *Deleted

## 2016-04-24 VITALS — BP 124/62 | HR 73 | Temp 97.0°F | Resp 16 | Ht 71.0 in | Wt 178.0 lb

## 2016-04-24 DIAGNOSIS — T82510A Breakdown (mechanical) of surgically created arteriovenous fistula, initial encounter: Secondary | ICD-10-CM

## 2016-04-24 DIAGNOSIS — G4730 Sleep apnea, unspecified: Secondary | ICD-10-CM | POA: Insufficient documentation

## 2016-04-24 DIAGNOSIS — M7989 Other specified soft tissue disorders: Secondary | ICD-10-CM | POA: Diagnosis not present

## 2016-04-24 DIAGNOSIS — I2510 Atherosclerotic heart disease of native coronary artery without angina pectoris: Secondary | ICD-10-CM

## 2016-04-24 DIAGNOSIS — I130 Hypertensive heart and chronic kidney disease with heart failure and stage 1 through stage 4 chronic kidney disease, or unspecified chronic kidney disease: Secondary | ICD-10-CM | POA: Diagnosis not present

## 2016-04-24 DIAGNOSIS — M79603 Pain in arm, unspecified: Secondary | ICD-10-CM | POA: Diagnosis present

## 2016-04-24 DIAGNOSIS — T82590A Other mechanical complication of surgically created arteriovenous fistula, initial encounter: Secondary | ICD-10-CM | POA: Diagnosis not present

## 2016-04-24 DIAGNOSIS — N184 Chronic kidney disease, stage 4 (severe): Secondary | ICD-10-CM | POA: Insufficient documentation

## 2016-04-24 DIAGNOSIS — I272 Other secondary pulmonary hypertension: Secondary | ICD-10-CM | POA: Insufficient documentation

## 2016-04-24 DIAGNOSIS — I5022 Chronic systolic (congestive) heart failure: Secondary | ICD-10-CM | POA: Insufficient documentation

## 2016-04-24 DIAGNOSIS — E1122 Type 2 diabetes mellitus with diabetic chronic kidney disease: Secondary | ICD-10-CM | POA: Insufficient documentation

## 2016-04-24 DIAGNOSIS — E785 Hyperlipidemia, unspecified: Secondary | ICD-10-CM | POA: Insufficient documentation

## 2016-04-24 NOTE — Progress Notes (Signed)
Referring Physician: Jamal Maes M.D. Patient name: [name] [name]. MRN: [id number] DOB: 02-07-1931 Sex: male  REASON FOR CONSULT: Ischemic steal  HPI: [name] [name]. is a 82 y.o. male, who had a right brachiocephalic AV fistula placed about 7 years ago. At the time of fistula placement he did have some numbness and tingling in his hand which was fairly mild. Over time this has become progressively worse. He now has had weakness in his hand causing him to drop a cup if he picks it up. This is fairly disabling to him because he is right-handed. Of note he does have a left-sided AICD. He is also on Coumadin for atrial fibrillation. Other medical problems include cardiomyopathy ejection fraction 15-20%, coronary artery disease, end-stage renal disease with dialysis on Monday Wednesday Friday. All of these are currently stable.  Past Medical History:  Diagnosis Date  . CARDIOMYOPATHY, PRIMARY, DILATED    a. Mixed ICM/NICM - EF 15% by echo 05/2009; St. Jude CRT-D implanted 06/2009. b. Echo 04/2013 - EF 20%, mild LVH.  [name] Chronic atrial fibrillation ([place])    a. Historically difficult rate control. b. s/p CRT-D implantation with anticipation of AV junction ablation but patient then was able to accomplish rate control and ablation not undertaken.; AVN ablation 08/2013 by Dr Lovena Le  . Chronic systolic CHF (congestive heart failure) ([place])   . CKD (chronic kidney disease), stage IV ([place])    a. Right brachiocephalic AV fistula [id number]. Neph = [place].  . Colon cancer ([place])   . CORONARY ARTERY DISEASE    a. BMS to LAD 04/2008. b. Cath 06/2009: nonobstructive disease. c. cath 05/16/2015 DES to prox LAD  . Diabetes mellitus   . Hyperlipidemia   . Hypertension   . Osteoarthritis   . Presence of permanent cardiac pacemaker   . Prostate cancer [facility])    a. s/p radical prostatectomy.  . Pulmonary HTN ([place])    a. PA pressure 39mmHg by echo 05/01/13.  [name] Secondary hyperparathyroidism ([place])   . Skin  cancer   . SLEEP APNEA   . Tubular adenoma of colon   . Valvular heart disease    a. Echo 04/2013: mild AI, mild MR.  . Vertigo    Past Surgical History:  Procedure Laterality Date  . ABLATION  08/2013   AVN ablation by Dr Lovena Le  . AV FISTULA PLACEMENT  03/30/2012   Procedure: ARTERIOVENOUS (AV) FISTULA CREATION;  Surgeon: Angelia Mould, MD;  Location: [place];  Service: Vascular;  Laterality: Right;  Creation of BrachioCephalic Fistula Right arm  . AV NODE ABLATION N/A 09/12/2013   Procedure: AV NODE ABLATION;  Surgeon: [name], MD;  Location: [facility] CATH LAB;  Service: Cardiovascular;  Laterality: N/A;  . BI-VENTRICULAR IMPLANTABLE CARDIOVERTER DEFIBRILLATOR  (CRT-D)  2010   STJ CRTD implanted by Dr [name]  . CARDIAC CATHETERIZATION N/A 05/09/2015   Procedure: Left Heart Cath and Coronary Angiography;  Surgeon: Sherren Mocha, MD;  Location: [place] CV LAB;  Service: Cardiovascular;  Laterality: N/A;  . CARDIAC CATHETERIZATION N/A 05/16/2015   Procedure: Coronary Stent Intervention;  Surgeon: Sherren Mocha, MD;  Location: [place] CV LAB;  Service: Cardiovascular;  Laterality: N/A;  . COLECTOMY    . EYE SURGERY     left eye prothesis  . FRACTURE SURGERY     collar bone, right knee replacement  . INSERT / REPLACE / REMOVE PACEMAKER    . JOINT REPLACEMENT    . PACEMAKER INSERTION  2010  . PENILE PROSTHESIS PLACEMENT    . PROSTATECTOMY    . PTCA     stent placed  . removed prosthetic eye    . TOTAL KNEE ARTHROPLASTY  2008   right    Family History  Problem Relation Age of Onset  . Heart disease Father   . Throat cancer Father   . Throat cancer Mother   . Heart disease Mother   . Hypertension Mother     SOCIAL HISTORY: Social History   Social History  . Marital status: Widowed    Spouse name: N/A  . Number of children: 4  . Years of education: N/A   Occupational History  . retired    Social History Main Topics  . Smoking status: Former Smoker     Types: Cigarettes    Quit date: 09/22/1966  . Smokeless tobacco: Never Used  . Alcohol use 0.0 - 0.6 oz/week     Comment: 05/07/2015 "might have a couple drinks/month"  . Drug use: No  . Sexual activity: No   Other Topics Concern  . Not on file   Social History Narrative  . No narrative on file    Allergies  Allergen Reactions  . Ace Inhibitors Other (See Comments)    Stopped by Dr. Burt Knack due to progressive renal failure  . Prednisone Other (See Comments)    Raised blood sugar to almost 900    Current Outpatient Prescriptions  Medication Sig Dispense Refill  . acetaminophen (TYLENOL) 500 MG tablet Take 500 mg by mouth as needed for mild pain (2 tablets once daily if needed).     [name] aspirin EC 81 MG tablet Take 1 tablet (81 mg total) by mouth daily. 90 tablet 3  . calcium carbonate (TUMS EX) 750 MG chewable tablet Chew 1 tablet by mouth 3 (three) times daily with meals.    [name] DIGOX 125 MCG tablet Take 1 tablet by mouth  every other day 45 tablet 1  . docusate sodium (COLACE) 100 MG capsule Take 300 mg by mouth daily.    [name] HUMALOG MIX 75/25 (75-25) 100 UNIT/ML SUSP injection Inject subcutaneously 26  units two times daily with  meals 5 vial 3  . hydrALAZINE (APRESOLINE) 25 MG tablet Take 37.5 mg by mouth as directed. Take 37.5mg  (1.5tab) by mouth three times a day on Sundays,tuesdays,Thursdays, and Saturdays.  Take 37.5mg  (1.5tabs) by mouth on Mondays,Wed,and Fridays     . isosorbide mononitrate (IMDUR) 30 MG 24 hr tablet Take 1 tablet by mouth  daily 90 tablet 3  . metoprolol succinate (TOPROL-XL) 100 MG 24 hr tablet TAKE 1 TABLET BY MOUTH  TWICE A DAY, WITH OR  IMMEDIATELY FOLLOWING MEALS (Patient taking differently: TAKE 1/2 TABLET BY MOUTH  TWICE A DAY, WITH OR  IMMEDIATELY FOLLOWING MEALS) 180 tablet 3  . multivitamin (RENA-VIT) TABS tablet Take 1 tablet by mouth daily.    . nitroGLYCERIN (NITROSTAT) 0.4 MG SL tablet Place 0.4 mg under the tongue every 5 (five) minutes as needed  for chest pain.    [name] warfarin (COUMADIN) 5 MG tablet Take 2.5 mg (one-half tablet) by mouth Mondays, and one whole tablet by mouth al other days (tue, Wed, Thur, Fri, Sat, Sun) 90 tablet 1   No current facility-administered medications for this visit.     ROS:   General:  No weight loss, Fever, chills  HEENT: No recent headaches, no nasal bleeding, no visual changes, no sore throat  Neurologic: No dizziness, blackouts, seizures.  No recent symptoms of stroke or mini- stroke. No recent episodes of slurred speech, or temporary blindness.  Cardiac: No recent episodes of chest pain/pressure, no shortness of breath at rest.  + shortness of breath with exertion.  +history of atrial fibrillation or irregular heartbeat  Vascular: No history of rest pain in feet.  No history of claudication.  No history of non-healing ulcer, No history of DVT   Pulmonary: No home oxygen, no productive cough, no hemoptysis,  No asthma or wheezing  Musculoskeletal:  [ ]  Arthritis, [ ]  Low back pain,  [ ]  Joint pain  Hematologic:No history of hypercoagulable state.  No history of easy bleeding.  No history of anemia  Gastrointestinal: No hematochezia or melena,  No gastroesophageal reflux, no trouble swallowing  Urinary: [x]  chronic Kidney disease, [ ]  on HD - [x ] MWF or [ ]  TTHS, [ ]  Burning with urination, [ ]  Frequent urination, [ ]  Difficulty urinating;   Skin: No rashes  Psychological: No history of anxiety,  No history of depression   Physical Examination  Vitals:   04/24/16 1432  BP: 124/62  Pulse: 73  Resp: 16  Temp: 97 F (36.1 C)  TempSrc: Oral  SpO2: 99%  Weight: 178 lb (80.7 kg)  Height: 5\' 11"  (1.803 m)    Body mass index is 24.83 kg/m.  General:  Alert and oriented, no acute distress HEENT: Normal Neck: No bruit or JVD Pulmonary: Clear to auscultation bilaterally Cardiac: Regular Rate and Rhythm  Abdomen: Soft, non-tender, non-distended Skin: No rash Extremity Pulses:  2+  radial, brachial, femoral, dorsalis pedis, posterior tibial pulses bilaterally Musculoskeletal: No deformity or edema  Neurologic: Upper and lower extremity motor 5/5 and symmetric  DATA:  Patient had a steal study performed today which showed increase in pressure at the right radial artery by 50 mmHg  ASSESSMENT:  Ischemic steal right hand secondary to brachiocephalic AV fistula with fairly debilitating neurologic symptoms in his dominant hand.   PLAN:  Ligation right upper arm AV fistula placement of tunneled dialysis catheter. The patient wished to defer this for 2 more weeks because he had a vacation planned in [place]. After we ligate his fistula and place his tunneled catheter he will come back to the office and we'll reevaluate him for placement of a new hemodialysis access. This may be fairly problematic due to the fact that he has an AICD that may cause some central venous obstruction. We will stop his Coumadin 3 days prior to the procedure.   [name], MD [facility] Office: [phone] Pager: [phone]

## 2016-05-15 ENCOUNTER — Ambulatory Visit (INDEPENDENT_AMBULATORY_CARE_PROVIDER_SITE_OTHER): Payer: Medicare Other | Admitting: *Deleted

## 2016-05-15 DIAGNOSIS — Z4502 Encounter for adjustment and management of automatic implantable cardiac defibrillator: Secondary | ICD-10-CM

## 2016-05-15 NOTE — Progress Notes (Signed)
Remote ICD transmission.   

## 2016-05-19 ENCOUNTER — Encounter (HOSPITAL_COMMUNITY): Payer: Self-pay | Admitting: *Deleted

## 2016-05-19 MED ORDER — SODIUM CHLORIDE 0.9 % IV SOLN: INTRAVENOUS | Status: DC

## 2016-05-19 MED ORDER — DEXTROSE 5 % IV SOLN
1.5000 g | INTRAVENOUS | Status: AC
  Administered 2016-05-20: 1.5 g via INTRAVENOUS
  Filled 2016-05-19: qty 1.5

## 2016-05-19 NOTE — Progress Notes (Signed)
Pt denies any acute cardiopulmonary issues but is under the care of Dr. Burt Knack, Cardiology. Pt stated that his fasting blood glucose is usually in the 140's. Pt stated that he was instructed to eat a chicken breast tonight ( close to midnight) and not advised by the MD to decrease dinnertime insulin. Dr. Jillyn Hidden, Anesthesia, advised that pt take 70% of dinner dose of Humalog 75/25 insulin. Pt made aware of diabetes protocol to check BS, interventions for BS<70 and > 220. Pt made aware to stop vitamins, fish oil, herbal medications and NSAID's. Pt stated last dose of Coumadin was Friday as instructed by MD. Pt verbalized understanding of all pre-op instructions. Pt history reviewed by anesthesia ( see note).

## 2016-05-19 NOTE — Progress Notes (Signed)
Anesthesia Chart Review:  SAME DAY WORK-UP.  Patient is an 82 year old male scheduled for insertion of dialysis catheter and ligation of right arm AVF on 05/20/16 by Dr. Scot Dock.  He had a right brachiocephalic AVF created on 99991111 and has had progressive steal syndrome. Ultimately, he will need new permanent HD access in the future.   History includes former smoker, DM2, HLD, HTN, ESRD (MWF), afib s/p AV ablation [id number], chronic systolic CHF, CAD s/p non-DES LAD 05/01/08 and DES to proximal LAD 05/09/15, dilated cardiomyopathy (both ischemic and non-ischemic components) s/p St. Jude single chamber ICD with LV lead placement 07/19/09, pulmonary hypertension (PAP 49 mmHg 05/01/13 echo), prostate CA s/p prostatectomy with penile prosthesis, OSA (on CPAP), colon cancer s/p colon surgery, left eye injury with prosthesis, right TKA '10.    - PCP is listed as Dr. Bluford Kaufmann, last visit 12/25/15. - Nephrologist is Dr. Lorrene Reid. - Cardiologist is Dr. Sherren Mocha, last visit 03/14/16. One year follow-up recommended. Plavix was discontinued at his 2/161/7 visit, and changed to ASA 81 mg daily. He is also on warfarin for afib. - EP Cardiologist is Dr. [name], last visit 09/05/15.  Meds include aspirin 81 mg, digoxin, Humalog 75/25, hydralazine, Imdur, Toprol-XL, nitroglycerin, warfarin. Per [facility], hold warfarin 3 days prior to surgery.   EKG 12/18/15:  Ventricular paced rhythm.  LHC 05/16/15:  PCI 3.5 x 15 mm Resolute DES to the proximal LAD, scoring balloon angioplasty  LHC 05/09/15: LAD: Proximal 75%, mid stent 25% ISR, small ostial D1 80% (small vessel) LCx: Irregularities RCA: Mid 50% Preserved cardiac output and well-compensated hemodynamics. Recommend: Staged PCI of LAD (due to advance CKD).  Echo 12/06/14: Mild LVH, EF 25-30%, diffuse HK, aortic sclerosis without stenosis, mild AI, mild MR, mild LAE, mild RAE, PASP 48 mmHg (Comparison EF 20-25% 09/11/13.)  Carotid US 05/01/13: Bilateral  ICA 1-39%  CXR 12/18/15: IMPRESSION: Mild bibasilar airspace opacities may reflect atelectasis or possibly mild pneumonia. Mild peribronchial thickening noted. Borderline cardiomegaly.  He will get labs on arrival. ICD perioperative RX form is still pending from [facility]. Further evaluation by his surgeon and anesthesiologist on the day of surgery to ensure labs are acceptable and no acute CV/CHF symptoms prior to proceeding.  [facility] Stay [facility]/Anesthesiology Phone [phone] 05/19/2016 12:50 PM

## 2016-05-20 ENCOUNTER — Ambulatory Visit (HOSPITAL_COMMUNITY)
Admission: RE | Admit: 2016-05-20 | Discharge: 2016-05-20 | Disposition: A | Discharge status: Home / Self Care | Payer: Medicare Other | Source: Ambulatory Visit | Attending: Vascular Surgery | Admitting: Vascular Surgery

## 2016-05-20 ENCOUNTER — Encounter (HOSPITAL_COMMUNITY): Payer: Self-pay | Admitting: Certified Registered Nurse Anesthetist

## 2016-05-20 ENCOUNTER — Ambulatory Visit (HOSPITAL_COMMUNITY): Payer: Medicare Other | Admitting: Vascular Surgery

## 2016-05-20 ENCOUNTER — Encounter (HOSPITAL_COMMUNITY)
Admission: RE | Disposition: A | Discharge status: Home / Self Care | Payer: Self-pay | Source: Ambulatory Visit | Attending: Vascular Surgery

## 2016-05-20 ENCOUNTER — Ambulatory Visit (HOSPITAL_COMMUNITY): Payer: Medicare Other

## 2016-05-20 DIAGNOSIS — I482 Chronic atrial fibrillation: Secondary | ICD-10-CM | POA: Insufficient documentation

## 2016-05-20 DIAGNOSIS — Z955 Presence of coronary angioplasty implant and graft: Secondary | ICD-10-CM | POA: Insufficient documentation

## 2016-05-20 DIAGNOSIS — Z95828 Presence of other vascular implants and grafts: Secondary | ICD-10-CM

## 2016-05-20 DIAGNOSIS — Z7982 Long term (current) use of aspirin: Secondary | ICD-10-CM | POA: Insufficient documentation

## 2016-05-20 DIAGNOSIS — I2510 Atherosclerotic heart disease of native coronary artery without angina pectoris: Secondary | ICD-10-CM | POA: Insufficient documentation

## 2016-05-20 DIAGNOSIS — Z87891 Personal history of nicotine dependence: Secondary | ICD-10-CM | POA: Diagnosis not present

## 2016-05-20 DIAGNOSIS — Z9049 Acquired absence of other specified parts of digestive tract: Secondary | ICD-10-CM | POA: Diagnosis not present

## 2016-05-20 DIAGNOSIS — Z79899 Other long term (current) drug therapy: Secondary | ICD-10-CM | POA: Diagnosis not present

## 2016-05-20 DIAGNOSIS — Y832 Surgical operation with anastomosis, bypass or graft as the cause of abnormal reaction of the patient, or of later complication, without mention of misadventure at the time of the procedure: Secondary | ICD-10-CM | POA: Diagnosis not present

## 2016-05-20 DIAGNOSIS — I132 Hypertensive heart and chronic kidney disease with heart failure and with stage 5 chronic kidney disease, or end stage renal disease: Secondary | ICD-10-CM | POA: Insufficient documentation

## 2016-05-20 DIAGNOSIS — G4733 Obstructive sleep apnea (adult) (pediatric): Secondary | ICD-10-CM | POA: Diagnosis not present

## 2016-05-20 DIAGNOSIS — Z794 Long term (current) use of insulin: Secondary | ICD-10-CM | POA: Diagnosis not present

## 2016-05-20 DIAGNOSIS — Z85038 Personal history of other malignant neoplasm of large intestine: Secondary | ICD-10-CM | POA: Insufficient documentation

## 2016-05-20 DIAGNOSIS — Z96651 Presence of right artificial knee joint: Secondary | ICD-10-CM | POA: Insufficient documentation

## 2016-05-20 DIAGNOSIS — Z992 Dependence on renal dialysis: Secondary | ICD-10-CM | POA: Insufficient documentation

## 2016-05-20 DIAGNOSIS — I420 Dilated cardiomyopathy: Secondary | ICD-10-CM | POA: Diagnosis not present

## 2016-05-20 DIAGNOSIS — Z95810 Presence of automatic (implantable) cardiac defibrillator: Secondary | ICD-10-CM | POA: Diagnosis not present

## 2016-05-20 DIAGNOSIS — E1122 Type 2 diabetes mellitus with diabetic chronic kidney disease: Secondary | ICD-10-CM | POA: Diagnosis not present

## 2016-05-20 DIAGNOSIS — T82898A Other specified complication of vascular prosthetic devices, implants and grafts, initial encounter: Secondary | ICD-10-CM | POA: Insufficient documentation

## 2016-05-20 DIAGNOSIS — Z8546 Personal history of malignant neoplasm of prostate: Secondary | ICD-10-CM | POA: Diagnosis not present

## 2016-05-20 DIAGNOSIS — Z7901 Long term (current) use of anticoagulants: Secondary | ICD-10-CM | POA: Diagnosis not present

## 2016-05-20 DIAGNOSIS — I5022 Chronic systolic (congestive) heart failure: Secondary | ICD-10-CM | POA: Diagnosis not present

## 2016-05-20 DIAGNOSIS — N186 End stage renal disease: Secondary | ICD-10-CM | POA: Insufficient documentation

## 2016-05-20 HISTORY — PX: INSERTION OF DIALYSIS CATHETER: SHX1324

## 2016-05-20 HISTORY — PX: LIGATION OF ARTERIOVENOUS  FISTULA: SHX5948

## 2016-05-20 LAB — GLUCOSE, CAPILLARY
GLUCOSE-CAPILLARY: 415 mg/dL — AB (ref 65–99)
GLUCOSE-CAPILLARY: 383 mg/dL — AB (ref 65–99)
GLUCOSE-CAPILLARY: 316 mg/dL — AB (ref 65–99)
GLUCOSE-CAPILLARY: 260 mg/dL — AB (ref 65–99)
Glucose-Capillary: 382 mg/dL — ABNORMAL HIGH (ref 65–99)

## 2016-05-20 LAB — POCT I-STAT 4, (NA,K, GLUC, HGB,HCT)
Glucose, Bld: 419 mg/dL — ABNORMAL HIGH (ref 65–99)
HCT: 30 % — ABNORMAL LOW (ref 39.0–52.0)
Hemoglobin: 10.2 g/dL — ABNORMAL LOW (ref 13.0–17.0)
Potassium: 3.9 mmol/L (ref 3.5–5.1)
Sodium: 133 mmol/L — ABNORMAL LOW (ref 135–145)

## 2016-05-20 LAB — PROTIME-INR
INR: 1.34
PROTHROMBIN TIME: 16.7 s — AB (ref 11.4–15.2)

## 2016-05-20 LAB — APTT: APTT: 32 s (ref 24–36)

## 2016-05-20 SURGERY — INSERTION OF DIALYSIS CATHETER
Anesthesia: Monitor Anesthesia Care | Site: Arm Upper | Laterality: Right

## 2016-05-20 MED ORDER — FENTANYL CITRATE (PF) 100 MCG/2ML IJ SOLN
INTRAMUSCULAR | Status: DC | PRN
  Administered 2016-05-20: 50 ug via INTRAVENOUS

## 2016-05-20 MED ORDER — HEPARIN SODIUM (PORCINE) 1000 UNIT/ML IJ SOLN
INTRAMUSCULAR | Status: AC
  Filled 2016-05-20: qty 1

## 2016-05-20 MED ORDER — METOPROLOL SUCCINATE ER 100 MG PO TB24: ORAL_TABLET | ORAL | 3 refills | Status: DC

## 2016-05-20 MED ORDER — ISOSORBIDE MONONITRATE ER 30 MG PO TB24: ORAL_TABLET | ORAL | 3 refills | Status: AC

## 2016-05-20 MED ORDER — NEOSTIGMINE METHYLSULFATE 5 MG/5ML IV SOSY
PREFILLED_SYRINGE | INTRAVENOUS | Status: AC
  Filled 2016-05-20: qty 5

## 2016-05-20 MED ORDER — LIDOCAINE HCL (PF) 1 % IJ SOLN
INTRAMUSCULAR | Status: DC | PRN
  Administered 2016-05-20: 20 mL

## 2016-05-20 MED ORDER — IOPAMIDOL (ISOVUE-300) INJECTION 61%
INTRAVENOUS | Status: AC
  Filled 2016-05-20: qty 50

## 2016-05-20 MED ORDER — SODIUM CHLORIDE 0.9 % IV SOLN
INTRAVENOUS | Status: DC
  Administered 2016-05-20: 07:00:00 via INTRAVENOUS

## 2016-05-20 MED ORDER — SODIUM CHLORIDE 0.9 % IV SOLN
INTRAVENOUS | Status: DC
  Filled 2016-05-20: qty 2.5

## 2016-05-20 MED ORDER — ROCURONIUM BROMIDE 10 MG/ML (PF) SYRINGE
PREFILLED_SYRINGE | INTRAVENOUS | Status: AC
  Filled 2016-05-20: qty 10

## 2016-05-20 MED ORDER — HYDROMORPHONE HCL 1 MG/ML IJ SOLN: 0.2500 mg | INTRAMUSCULAR | Status: DC | PRN

## 2016-05-20 MED ORDER — CHLORHEXIDINE GLUCONATE CLOTH 2 % EX PADS: 6.0000 | MEDICATED_PAD | Freq: Once | CUTANEOUS | Status: DC

## 2016-05-20 MED ORDER — SODIUM CHLORIDE 0.9 % IV SOLN
INTRAVENOUS | Status: DC
  Administered 2016-05-20: 5 [IU]/h via INTRAVENOUS
  Filled 2016-05-20: qty 2.5

## 2016-05-20 MED ORDER — HEPARIN SODIUM (PORCINE) 1000 UNIT/ML IJ SOLN
INTRAMUSCULAR | Status: DC | PRN
  Administered 2016-05-20: 3.4 [IU] via INTRAVENOUS

## 2016-05-20 MED ORDER — OXYCODONE-ACETAMINOPHEN 5-325 MG PO TABS: 1.0000 | ORAL_TABLET | Freq: Four times a day (QID) | ORAL | 0 refills | Status: DC | PRN

## 2016-05-20 MED ORDER — PROPOFOL 10 MG/ML IV BOLUS
INTRAVENOUS | Status: AC
  Filled 2016-05-20: qty 20

## 2016-05-20 MED ORDER — SODIUM CHLORIDE 0.9 % IV SOLN
INTRAVENOUS | Status: DC
  Administered 2016-05-20: 3.6 [IU]/h via INTRAVENOUS
  Filled 2016-05-20: qty 2.5

## 2016-05-20 MED ORDER — LIDOCAINE HCL (PF) 1 % IJ SOLN
INTRAMUSCULAR | Status: AC
  Filled 2016-05-20: qty 30

## 2016-05-20 MED ORDER — WARFARIN SODIUM 5 MG PO TABS: 2.5000 mg | ORAL_TABLET | ORAL | 0 refills | Status: DC

## 2016-05-20 MED ORDER — INSULIN REGULAR BOLUS VIA INFUSION
0.0000 [IU] | Freq: Three times a day (TID) | INTRAVENOUS | Status: DC
  Filled 2016-05-20: qty 10

## 2016-05-20 MED ORDER — SODIUM CHLORIDE 0.9 % IV SOLN
INTRAVENOUS | Status: DC | PRN
  Administered 2016-05-20: 500 mL

## 2016-05-20 MED ORDER — 0.9 % SODIUM CHLORIDE (POUR BTL) OPTIME
TOPICAL | Status: DC | PRN
  Administered 2016-05-20: 1000 mL

## 2016-05-20 MED ORDER — PROMETHAZINE HCL 25 MG/ML IJ SOLN: 6.2500 mg | INTRAMUSCULAR | Status: DC | PRN

## 2016-05-20 MED ORDER — DIGOXIN 125 MCG PO TABS: ORAL_TABLET | ORAL | 1 refills | Status: AC

## 2016-05-20 MED ORDER — ONDANSETRON HCL 4 MG/2ML IJ SOLN
INTRAMUSCULAR | Status: AC
  Filled 2016-05-20: qty 2

## 2016-05-20 MED ORDER — PROPOFOL 500 MG/50ML IV EMUL
INTRAVENOUS | Status: DC | PRN
  Administered 2016-05-20: 25 ug/kg/min via INTRAVENOUS

## 2016-05-20 MED ORDER — DEXTROSE-NACL 5-0.45 % IV SOLN: INTRAVENOUS | Status: DC

## 2016-05-20 MED ORDER — ARTIFICIAL TEARS OP OINT
TOPICAL_OINTMENT | OPHTHALMIC | Status: AC
  Filled 2016-05-20: qty 3.5

## 2016-05-20 MED ORDER — LIDOCAINE 2% (20 MG/ML) 5 ML SYRINGE
INTRAMUSCULAR | Status: AC
  Filled 2016-05-20: qty 5

## 2016-05-20 MED ORDER — FENTANYL CITRATE (PF) 250 MCG/5ML IJ SOLN
INTRAMUSCULAR | Status: AC
  Filled 2016-05-20: qty 5

## 2016-05-20 MED ORDER — DEXTROSE 50 % IV SOLN
25.0000 mL | INTRAVENOUS | Status: DC | PRN
  Filled 2016-05-20: qty 50

## 2016-05-20 MED ORDER — GLYCOPYRROLATE 0.2 MG/ML IV SOSY
PREFILLED_SYRINGE | INTRAVENOUS | Status: AC
  Filled 2016-05-20: qty 3

## 2016-05-20 SURGICAL SUPPLY — 50 items
BAG DECANTER FOR FLEXI CONT (MISCELLANEOUS) ×3 IMPLANT
BIOPATCH RED 1 DISK 7.0 (GAUZE/BANDAGES/DRESSINGS) ×3 IMPLANT
CANISTER SUCTION 2500CC (MISCELLANEOUS) ×3 IMPLANT
CATH PALINDROME RT-P 15FX19CM (CATHETERS) IMPLANT
CATH PALINDROME RT-P 15FX23CM (CATHETERS) ×3 IMPLANT
CATH PALINDROME RT-P 15FX28CM (CATHETERS) IMPLANT
CATH PALINDROME RT-P 15FX55CM (CATHETERS) IMPLANT
CATH STRAIGHT 5FR 65CM (CATHETERS) IMPLANT
CHLORAPREP W/TINT 26ML (MISCELLANEOUS) ×3 IMPLANT
COVER PROBE W GEL 5X96 (DRAPES) IMPLANT
DECANTER SPIKE VIAL GLASS SM (MISCELLANEOUS) IMPLANT
DRAPE C-ARM 42X72 X-RAY (DRAPES) ×3 IMPLANT
DRAPE CHEST BREAST 15X10 FENES (DRAPES) ×3 IMPLANT
ELECT REM PT RETURN 9FT ADLT (ELECTROSURGICAL) ×3
ELECTRODE REM PT RTRN 9FT ADLT (ELECTROSURGICAL) ×2 IMPLANT
GAUZE SPONGE 2X2 8PLY STRL LF (GAUZE/BANDAGES/DRESSINGS) ×2 IMPLANT
GAUZE SPONGE 4X4 16PLY XRAY LF (GAUZE/BANDAGES/DRESSINGS) ×3 IMPLANT
GEL ULTRASOUND 20GR AQUASONIC (MISCELLANEOUS) ×3 IMPLANT
GLOVE BIO SURGEON STRL SZ7.5 (GLOVE) ×3 IMPLANT
GOWN STRL REUS W/ TWL LRG LVL3 (GOWN DISPOSABLE) ×6 IMPLANT
GOWN STRL REUS W/TWL LRG LVL3 (GOWN DISPOSABLE) ×3
HEMOSTAT SPONGE AVITENE ULTRA (HEMOSTASIS) IMPLANT
KIT BASIN OR (CUSTOM PROCEDURE TRAY) ×3 IMPLANT
KIT ROOM TURNOVER OR (KITS) ×3 IMPLANT
LIQUID BAND (GAUZE/BANDAGES/DRESSINGS) ×6 IMPLANT
LOOP VESSEL MINI RED (MISCELLANEOUS) IMPLANT
NEEDLE 18GX1X1/2 (RX/OR ONLY) (NEEDLE) ×3 IMPLANT
NEEDLE HYPO 25GX1X1/2 BEV (NEEDLE) ×3 IMPLANT
NS IRRIG 1000ML POUR BTL (IV SOLUTION) ×3 IMPLANT
PACK CV ACCESS (CUSTOM PROCEDURE TRAY) ×3 IMPLANT
PACK SURGICAL SETUP 50X90 (CUSTOM PROCEDURE TRAY) ×3 IMPLANT
PAD ARMBOARD 7.5X6 YLW CONV (MISCELLANEOUS) ×6 IMPLANT
SET MICROPUNCTURE 5F STIFF (MISCELLANEOUS) IMPLANT
SPONGE GAUZE 2X2 STER 10/PKG (GAUZE/BANDAGES/DRESSINGS) ×1
SUT ETHILON 3 0 PS 1 (SUTURE) ×3 IMPLANT
SUT PROLENE 6 0 CC (SUTURE) IMPLANT
SUT SILK 0 (SUTURE) IMPLANT
SUT SILK 0 TIES 10X30 (SUTURE) ×3 IMPLANT
SUT VIC AB 3-0 SH 27 (SUTURE) ×1
SUT VIC AB 3-0 SH 27X BRD (SUTURE) ×2 IMPLANT
SUT VICRYL 4-0 PS2 18IN ABS (SUTURE) ×3 IMPLANT
SYR 20CC LL (SYRINGE) ×6 IMPLANT
SYR 5ML LL (SYRINGE) ×3 IMPLANT
SYR 5ML LUER SLIP (SYRINGE) ×3 IMPLANT
SYR CONTROL 10ML LL (SYRINGE) ×3 IMPLANT
SYRINGE 10CC LL (SYRINGE) ×3 IMPLANT
TAPE CLOTH SURG 4X10 WHT LF (GAUZE/BANDAGES/DRESSINGS) ×3 IMPLANT
UNDERPAD 30X30 (UNDERPADS AND DIAPERS) ×3 IMPLANT
WATER STERILE IRR 1000ML POUR (IV SOLUTION) ×3 IMPLANT
WIRE AMPLATZ SS-J .035X180CM (WIRE) IMPLANT

## 2016-05-20 NOTE — Progress Notes (Signed)
Dr Orene Desanctis in to see pt states to increase insulin rate to 5.0. CRNA at bedside.

## 2016-05-20 NOTE — Op Note (Signed)
Procedure: Insertion 23 cm palindrome catheter right internal jugular vein.  Ligation of right brachiocephalic AV fistula   Preoperative diagnosis: Ischemic steal right arm  Postoperative diagnosis: Same  Anesthesia: Local with IV sedation  Operative details:  After obtaining informed consent, the patient was taken to the operating room. The patient was placed in supine position on the operating room table. After adequate sedation the patient's entire neck and chest were prepped and draped in usual sterile fashion. The patient was placed in Trendelenburg position. Ultrasound was used to identify the patient's right internal jugular vein. This had normal compressibility and respiratory variation. Local anesthesia was infiltrated over the right jugular vein.  Using ultrasound guidance, the right carotid was inadvertently cannulated on the first pass.  The needle was removed and direct pressure held for 3 minutes to obtain hemostasis.  On the second attempt the right internal jugular vein was successfully cannulated.  A 0.035 J-tipped guidewire was threaded into the right internal jugular vein and into the superior vena cava followed by the inferior vena cava under fluoroscopic guidance.   Next sequential 12 and 14 dilators were placed over the guidewire into the right atrium.  A 16 French dilator with a peel-away sheath was then placed over the guidewire into the right atrium.   The guidewire and dilator were removed. A 23 cm Palindrome catheter was then placed through the peel away sheath into the right atrium.  The catheter was then tunneled subcutaneously, cut to length, and the hub attached. The catheter was noted to flush and draw easily. The catheter was inspected under fluoroscopy and found with its tip to be in the right atrium without any kinks throughout its course. The catheter was sutured to the skin with nylon sutures. The neck insertion site was closed with Vicryl stitch. The catheter was then  loaded with concentrated Heparin solution. A dry sterile dressing was applied.  Next the patient's entire right upper extremity was prepped and draped in usual sterile fashion. A transverse incision was made through a pre-existing scar in the right antecubital region. The incision was carried into the subcutaneous tissues down to level of a pre-existing right brachiocephalic AV fistula. The fistula was dissected free circumferentially adjacent to the level of the brachial artery anastomosis.  The fistula was ligated with a 0 silk tie. The subcutaneous tissues of the  incision  was then reapproximated using a running 3-0 Vicryl suture. The skin was closed with 4 0 Vicryl subcuticular stitch. Dermabond was applied all incisions. The patient tolerated procedure well there were no complications. Sponge and needle counts were correct at the end of the case. Patient was taken to the recovery room in stable condition.  Pt had a palpable radial pulse at the end of the case.  [name], MD [facility] Office: [phone] Pager: [phone]

## 2016-05-20 NOTE — Progress Notes (Signed)
Dr Orene Desanctis called and informed of CBG of 382 states to leave drip at 5.0 and recheck CBG in 30 minutes.

## 2016-05-20 NOTE — Progress Notes (Signed)
notifed dr massage of pts bs=316 insulin gtt down to 3gtts and will recheck bs in 30 minutes and notify dr massage of results

## 2016-05-20 NOTE — Anesthesia Preprocedure Evaluation (Addendum)
Anesthesia Evaluation  Patient identified by MRN, date of birth, ID band  Reviewed: Allergy & Precautions, NPO status , Patient's Chart, lab work & pertinent test results  History of Anesthesia Complications Negative for: history of anesthetic complications  Airway Mallampati: II  TM Distance: >3 FB Neck ROM: Full    Dental  (+) Edentulous Upper, Dental Advisory Given   Pulmonary shortness of breath, sleep apnea , former smoker,    breath sounds clear to auscultation       Cardiovascular hypertension, + CAD and +CHF  + pacemaker + Cardiac Defibrillator  Rhythm:Regular Rate:Normal  Dilated CM, low EF    Neuro/Psych negative neurological ROS     GI/Hepatic   Endo/Other  diabetes, Poorly Controlled, Type 1Glucose 419  Renal/GU ESRFRenal disease     Musculoskeletal  (+) Arthritis ,   Abdominal   Peds  Hematology   Anesthesia Other Findings   Reproductive/Obstetrics                          Anesthesia Physical Anesthesia Plan  ASA: IV  Anesthesia Plan: MAC   Post-op Pain Management:    Induction: Intravenous  Airway Management Planned: Natural Airway and Simple Face Mask  Additional Equipment:   Intra-op Plan:   Post-operative Plan:   Informed Consent: I have reviewed the patients History and Physical, chart, labs and discussed the procedure including the risks, benefits and alternatives for the proposed anesthesia with the patient or authorized representative who has indicated his/her understanding and acceptance.     Plan Discussed with:   Anesthesia Plan Comments:         Anesthesia Quick Evaluation

## 2016-05-20 NOTE — Progress Notes (Addendum)
Call back from [facility] rep states it will be  30-45 minutes before they rep can get here. Dr Orene Desanctis informed.

## 2016-05-20 NOTE — Anesthesia Postprocedure Evaluation (Signed)
Anesthesia Post Note  Patient: [name] [name].  Procedure(s) Performed: Procedure(s) (LRB): INSERTION OF DIALYSIS CATHETER (N/A) LIGATION OF RIGHT ARTERIOVENOUS  FISTULA (Right)  Patient location during evaluation: PACU Anesthesia Type: MAC Level of consciousness: awake and alert Pain management: pain level controlled Vital Signs Assessment: post-procedure vital signs reviewed and stable Respiratory status: spontaneous breathing, nonlabored ventilation, respiratory function stable and patient connected to nasal cannula oxygen Cardiovascular status: stable and blood pressure returned to baseline Anesthetic complications: no    Last Vitals:  Vitals:   05/20/16 1130 05/20/16 1135  BP:  131/75  Pulse: 74   Resp: 17   Temp:      Last Pain:  Vitals:   05/20/16 1100  PainSc: 0-No pain                 Tredarius Cobern,JAMES TERRILL

## 2016-05-20 NOTE — H&P (View-Only) (Signed)
Referring Physician: Jamal Maes M.D. Patient name: [name] [name]. MRN: [id number] DOB: November 24, 1930 Sex: male  REASON FOR CONSULT: Ischemic steal  HPI: [name] [name]. is a 82 y.o. male, who had a right brachiocephalic AV fistula placed about 7 years ago. At the time of fistula placement he did have some numbness and tingling in his hand which was fairly mild. Over time this has become progressively worse. He now has had weakness in his hand causing him to drop a cup if he picks it up. This is fairly disabling to him because he is right-handed. Of note he does have a left-sided AICD. He is also on Coumadin for atrial fibrillation. Other medical problems include cardiomyopathy ejection fraction 15-20%, coronary artery disease, end-stage renal disease with dialysis on Monday Wednesday Friday. All of these are currently stable.  Past Medical History:  Diagnosis Date  . CARDIOMYOPATHY, PRIMARY, DILATED    a. Mixed ICM/NICM - EF 15% by echo 05/2009; St. Jude CRT-D implanted 06/2009. b. Echo 04/2013 - EF 20%, mild LVH.  [name] Chronic atrial fibrillation ([place])    a. Historically difficult rate control. b. s/p CRT-D implantation with anticipation of AV junction ablation but patient then was able to accomplish rate control and ablation not undertaken.; AVN ablation 08/2013 by Dr Lovena Le  . Chronic systolic CHF (congestive heart failure) ([place])   . CKD (chronic kidney disease), stage IV ([place])    a. Right brachiocephalic AV fistula [id number]. Neph = [place].  . Colon cancer ([place])   . CORONARY ARTERY DISEASE    a. BMS to LAD 04/2008. b. Cath 06/2009: nonobstructive disease. c. cath 05/16/2015 DES to prox LAD  . Diabetes mellitus   . Hyperlipidemia   . Hypertension   . Osteoarthritis   . Presence of permanent cardiac pacemaker   . Prostate cancer [facility])    a. s/p radical prostatectomy.  . Pulmonary HTN ([place])    a. PA pressure 78mmHg by echo 05/01/13.  [name] Secondary hyperparathyroidism ([place])   . Skin  cancer   . SLEEP APNEA   . Tubular adenoma of colon   . Valvular heart disease    a. Echo 04/2013: mild AI, mild MR.  . Vertigo    Past Surgical History:  Procedure Laterality Date  . ABLATION  08/2013   AVN ablation by Dr Lovena Le  . AV FISTULA PLACEMENT  03/30/2012   Procedure: ARTERIOVENOUS (AV) FISTULA CREATION;  Surgeon: Angelia Mould, MD;  Location: [place];  Service: Vascular;  Laterality: Right;  Creation of BrachioCephalic Fistula Right arm  . AV NODE ABLATION N/A 09/12/2013   Procedure: AV NODE ABLATION;  Surgeon: [name], MD;  Location: [facility] CATH LAB;  Service: Cardiovascular;  Laterality: N/A;  . BI-VENTRICULAR IMPLANTABLE CARDIOVERTER DEFIBRILLATOR  (CRT-D)  2010   STJ CRTD implanted by Dr [name]  . CARDIAC CATHETERIZATION N/A 05/09/2015   Procedure: Left Heart Cath and Coronary Angiography;  Surgeon: Sherren Mocha, MD;  Location: [place] CV LAB;  Service: Cardiovascular;  Laterality: N/A;  . CARDIAC CATHETERIZATION N/A 05/16/2015   Procedure: Coronary Stent Intervention;  Surgeon: Sherren Mocha, MD;  Location: [place] CV LAB;  Service: Cardiovascular;  Laterality: N/A;  . COLECTOMY    . EYE SURGERY     left eye prothesis  . FRACTURE SURGERY     collar bone, right knee replacement  . INSERT / REPLACE / REMOVE PACEMAKER    . JOINT REPLACEMENT    . PACEMAKER INSERTION  2010  . PENILE PROSTHESIS PLACEMENT    . PROSTATECTOMY    . PTCA     stent placed  . removed prosthetic eye    . TOTAL KNEE ARTHROPLASTY  2008   right    Family History  Problem Relation Age of Onset  . Heart disease Father   . Throat cancer Father   . Throat cancer Mother   . Heart disease Mother   . Hypertension Mother     SOCIAL HISTORY: Social History   Social History  . Marital status: Widowed    Spouse name: N/A  . Number of children: 4  . Years of education: N/A   Occupational History  . retired    Social History Main Topics  . Smoking status: Former Smoker     Types: Cigarettes    Quit date: 09/22/1966  . Smokeless tobacco: Never Used  . Alcohol use 0.0 - 0.6 oz/week     Comment: 05/07/2015 "might have a couple drinks/month"  . Drug use: No  . Sexual activity: No   Other Topics Concern  . Not on file   Social History Narrative  . No narrative on file    Allergies  Allergen Reactions  . Ace Inhibitors Other (See Comments)    Stopped by Dr. Burt Knack due to progressive renal failure  . Prednisone Other (See Comments)    Raised blood sugar to almost 900    Current Outpatient Prescriptions  Medication Sig Dispense Refill  . acetaminophen (TYLENOL) 500 MG tablet Take 500 mg by mouth as needed for mild pain (2 tablets once daily if needed).     [name] aspirin EC 81 MG tablet Take 1 tablet (81 mg total) by mouth daily. 90 tablet 3  . calcium carbonate (TUMS EX) 750 MG chewable tablet Chew 1 tablet by mouth 3 (three) times daily with meals.    [name] DIGOX 125 MCG tablet Take 1 tablet by mouth  every other day 45 tablet 1  . docusate sodium (COLACE) 100 MG capsule Take 300 mg by mouth daily.    [name] HUMALOG MIX 75/25 (75-25) 100 UNIT/ML SUSP injection Inject subcutaneously 26  units two times daily with  meals 5 vial 3  . hydrALAZINE (APRESOLINE) 25 MG tablet Take 37.5 mg by mouth as directed. Take 37.5mg  (1.5tab) by mouth three times a day on Sundays,tuesdays,Thursdays, and Saturdays.  Take 37.5mg  (1.5tabs) by mouth on Mondays,Wed,and Fridays     . isosorbide mononitrate (IMDUR) 30 MG 24 hr tablet Take 1 tablet by mouth  daily 90 tablet 3  . metoprolol succinate (TOPROL-XL) 100 MG 24 hr tablet TAKE 1 TABLET BY MOUTH  TWICE A DAY, WITH OR  IMMEDIATELY FOLLOWING MEALS (Patient taking differently: TAKE 1/2 TABLET BY MOUTH  TWICE A DAY, WITH OR  IMMEDIATELY FOLLOWING MEALS) 180 tablet 3  . multivitamin (RENA-VIT) TABS tablet Take 1 tablet by mouth daily.    . nitroGLYCERIN (NITROSTAT) 0.4 MG SL tablet Place 0.4 mg under the tongue every 5 (five) minutes as needed  for chest pain.    [name] warfarin (COUMADIN) 5 MG tablet Take 2.5 mg (one-half tablet) by mouth Mondays, and one whole tablet by mouth al other days (tue, Wed, Thur, Fri, Sat, Sun) 90 tablet 1   No current facility-administered medications for this visit.     ROS:   General:  No weight loss, Fever, chills  HEENT: No recent headaches, no nasal bleeding, no visual changes, no sore throat  Neurologic: No dizziness, blackouts, seizures.  No recent symptoms of stroke or mini- stroke. No recent episodes of slurred speech, or temporary blindness.  Cardiac: No recent episodes of chest pain/pressure, no shortness of breath at rest.  + shortness of breath with exertion.  +history of atrial fibrillation or irregular heartbeat  Vascular: No history of rest pain in feet.  No history of claudication.  No history of non-healing ulcer, No history of DVT   Pulmonary: No home oxygen, no productive cough, no hemoptysis,  No asthma or wheezing  Musculoskeletal:  [ ]  Arthritis, [ ]  Low back pain,  [ ]  Joint pain  Hematologic:No history of hypercoagulable state.  No history of easy bleeding.  No history of anemia  Gastrointestinal: No hematochezia or melena,  No gastroesophageal reflux, no trouble swallowing  Urinary: [x]  chronic Kidney disease, [ ]  on HD - [x ] MWF or [ ]  TTHS, [ ]  Burning with urination, [ ]  Frequent urination, [ ]  Difficulty urinating;   Skin: No rashes  Psychological: No history of anxiety,  No history of depression   Physical Examination  Vitals:   04/24/16 1432  BP: 124/62  Pulse: 73  Resp: 16  Temp: 97 F (36.1 C)  TempSrc: Oral  SpO2: 99%  Weight: 178 lb (80.7 kg)  Height: 5\' 11"  (1.803 m)    Body mass index is 24.83 kg/m.  General:  Alert and oriented, no acute distress HEENT: Normal Neck: No bruit or JVD Pulmonary: Clear to auscultation bilaterally Cardiac: Regular Rate and Rhythm  Abdomen: Soft, non-tender, non-distended Skin: No rash Extremity Pulses:  2+  radial, brachial, femoral, dorsalis pedis, posterior tibial pulses bilaterally Musculoskeletal: No deformity or edema  Neurologic: Upper and lower extremity motor 5/5 and symmetric  DATA:  Patient had a steal study performed today which showed increase in pressure at the right radial artery by 50 mmHg  ASSESSMENT:  Ischemic steal right hand secondary to brachiocephalic AV fistula with fairly debilitating neurologic symptoms in his dominant hand.   PLAN:  Ligation right upper arm AV fistula placement of tunneled dialysis catheter. The patient wished to defer this for 2 more weeks because he had a vacation planned in [place]. After we ligate his fistula and place his tunneled catheter he will come back to the office and we'll reevaluate him for placement of a new hemodialysis access. This may be fairly problematic due to the fact that he has an AICD that may cause some central venous obstruction. We will stop his Coumadin 3 days prior to the procedure.   [name], MD [facility] Office: [phone] Pager: [phone]

## 2016-05-20 NOTE — Progress Notes (Signed)
St Jude rep in to see pt. Pt placed on heart monitor.

## 2016-05-20 NOTE — Anesthesia Procedure Notes (Signed)
Procedure Name: MAC Date/Time: 05/20/2016 9:35 AM Performed by: Lowella Dell Pre-anesthesia Checklist: Patient identified, Emergency Drugs available, Suction available, Patient being monitored and Timeout performed Patient Re-evaluated:Patient Re-evaluated prior to inductionOxygen Delivery Method: Nasal cannula Intubation Type: IV induction Placement Confirmation: positive ETCO2 Dental Injury: Teeth and Oropharynx as per pre-operative assessment

## 2016-05-20 NOTE — Transfer of Care (Signed)
Immediate Anesthesia Transfer of Care Note  Patient: [name] [name].  Procedure(s) Performed: Procedure(s): INSERTION OF DIALYSIS CATHETER (N/A) LIGATION OF RIGHT ARTERIOVENOUS  FISTULA (Right)  Patient Location: PACU  Anesthesia Type:MAC  Level of Consciousness: awake, alert , oriented and patient cooperative  Airway & Oxygen Therapy: Patient Spontanous Breathing and Patient connected to nasal cannula oxygen  Post-op Assessment: Report given to RN and Post -op Vital signs reviewed and stable  Post vital signs: Reviewed and stable  Last Vitals:  Vitals:   05/20/16 0629 05/20/16 0642  BP: (!) 137/56   Pulse:  76  Resp:  18  Temp:  36.8 C    Last Pain: There were no vitals filed for this visit.    Patients Stated Pain Goal: 2 ([id number])  Complications: No apparent anesthesia complications

## 2016-05-20 NOTE — Progress Notes (Signed)
CBG 383 CRNA informed.

## 2016-05-20 NOTE — Progress Notes (Signed)
Dialysis access [place] faxex to [facility]

## 2016-05-20 NOTE — Interval H&P Note (Signed)
History and Physical Interval Note:  05/20/2016 7:38 AM  [name] [name].  has presented today for surgery, with the diagnosis of chronic kidney disease   The various methods of treatment have been discussed with the patient and family. After consideration of risks, benefits and other options for treatment, the patient has consented to  Procedure(s): INSERTION OF DIALYSIS CATHETER (N/A) LIGATION OF ARTERIOVENOUS  FISTULA (Right) as a surgical intervention .  The patient's history has been reviewed, patient examined, no change in status, stable for surgery.  I have reviewed the patient's chart and labs.  Questions were answered to the patient's satisfaction.     [name]

## 2016-05-20 NOTE — Progress Notes (Signed)
AICD on per [facility] Jude's rep/[place]

## 2016-05-20 NOTE — Progress Notes (Signed)
CBG 260/ dr Orene Desanctis aware, and of AICD ON...the patient to be instructed to check CBG this evening and Rx as necessary / ok to d/c

## 2016-05-20 NOTE — Progress Notes (Signed)
St Jude rep paged to return call

## 2016-05-21 ENCOUNTER — Telehealth: Payer: Self-pay

## 2016-05-21 ENCOUNTER — Encounter (HOSPITAL_COMMUNITY): Payer: Self-pay | Admitting: Vascular Surgery

## 2016-05-21 ENCOUNTER — Telehealth: Payer: Self-pay | Admitting: Vascular Surgery

## 2016-05-21 NOTE — Telephone Encounter (Signed)
-----   Message from Mena Goes, RN sent at 05/20/2016 10:11 AM EDT ----- Regarding: schedule postop 2-3 weeks   ----- Message ----- From: Alvia Grove, PA-C Sent: 05/20/2016  10:06 AM To: Vvs Charge Pool  S/p insertion of TDC and ligation of right arm AVF 05/20/16  F/u with Dr. Oneida Alar in next 2-3 weeks.  Thanks Maudie Mercury

## 2016-05-21 NOTE — Telephone Encounter (Signed)
Patient's INR goal is 2.0-3.0--- INR checked at [place] resulting at 1.34----are you ok with holding next 2 doses (5 mg each) and then rechecking again at next dailysis session

## 2016-05-21 NOTE — Telephone Encounter (Signed)
Sched appt 9/21 at 3:15. Spoke to [place] to inform them of appt.

## 2016-05-21 NOTE — Telephone Encounter (Signed)
Recd faxed copy from [facility] resulting INR at 1.34---per greg, ok to increase dosage by 5mg  today and tomorrow ( to equal 10mg  total today and 10mg  total tomorrow)---kidney ctr should repeat InR value next [facility] to jinny/patient's caregiver ---jinny repeated back for understanding---she will call back if patient has any further questions

## 2016-05-22 ENCOUNTER — Encounter: Payer: Self-pay | Admitting: Cardiology

## 2016-05-27 ENCOUNTER — Ambulatory Visit: Payer: Self-pay | Admitting: General Practice

## 2016-05-27 ENCOUNTER — Telehealth: Payer: Self-pay | Admitting: *Deleted

## 2016-05-27 ENCOUNTER — Other Ambulatory Visit: Payer: Self-pay | Admitting: General Practice

## 2016-05-27 DIAGNOSIS — Z5181 Encounter for therapeutic drug level monitoring: Secondary | ICD-10-CM

## 2016-05-27 DIAGNOSIS — I482 Chronic atrial fibrillation, unspecified: Secondary | ICD-10-CM

## 2016-05-27 MED ORDER — WARFARIN SODIUM 5 MG PO TABS: ORAL_TABLET | ORAL | 1 refills | Status: DC

## 2016-05-27 NOTE — Progress Notes (Signed)
I have reviewed and agree with the plan. 

## 2016-05-27 NOTE — Telephone Encounter (Signed)
[facility] sent request for a 90 day supply.

## 2016-05-28 LAB — CUP PACEART REMOTE DEVICE CHECK
Battery Voltage: 2.66 V
HIGH POWER IMPEDANCE MEASURED VALUE: 38 Ohm
Implantable Lead Implant Date: 20101028
Implantable Lead Location: 753858
Lead Channel Impedance Value: 430 Ohm
Lead Channel Pacing Threshold Amplitude: 1 V
Lead Channel Sensing Intrinsic Amplitude: 12 mV
Lead Channel Setting Pacing Amplitude: 2.5 V
Lead Channel Setting Pacing Pulse Width: 0.6 ms
Lead Channel Setting Pacing Pulse Width: 1 ms
MDC IDC LEAD IMPLANT DT: 20101028
MDC IDC LEAD LOCATION: 753860
MDC IDC LEAD MODEL: 4196
MDC IDC LEAD MODEL: 7121
MDC IDC MSMT BATTERY REMAINING LONGEVITY: 4 mo
MDC IDC MSMT BATTERY REMAINING PERCENTAGE: 9 %
MDC IDC MSMT LEADCHNL LV IMPEDANCE VALUE: 430 Ohm
MDC IDC MSMT LEADCHNL LV PACING THRESHOLD AMPLITUDE: 3.5 V
MDC IDC MSMT LEADCHNL LV PACING THRESHOLD PULSEWIDTH: 1 ms
MDC IDC MSMT LEADCHNL RV PACING THRESHOLD PULSEWIDTH: 0.6 ms
MDC IDC SESS DTM: 20170824060010
MDC IDC SET LEADCHNL LV PACING AMPLITUDE: 5 V
MDC IDC SET LEADCHNL RV SENSING SENSITIVITY: 0.5 mV
Pulse Gen Serial Number: 595859

## 2016-05-30 ENCOUNTER — Other Ambulatory Visit: Payer: Self-pay | Admitting: General Practice

## 2016-05-30 MED ORDER — WARFARIN SODIUM 5 MG PO TABS: ORAL_TABLET | ORAL | 1 refills | Status: DC

## 2016-05-30 NOTE — Telephone Encounter (Signed)
Pharm needs to know the directions for warfarin

## 2016-05-30 NOTE — Telephone Encounter (Signed)
Re-sent script to [facility].

## 2016-06-04 ENCOUNTER — Ambulatory Visit (INDEPENDENT_AMBULATORY_CARE_PROVIDER_SITE_OTHER): Payer: Medicare Other | Admitting: Internal Medicine

## 2016-06-04 ENCOUNTER — Encounter: Payer: Self-pay | Admitting: Internal Medicine

## 2016-06-04 DIAGNOSIS — Z794 Long term (current) use of insulin: Secondary | ICD-10-CM | POA: Diagnosis not present

## 2016-06-04 DIAGNOSIS — I10 Essential (primary) hypertension: Secondary | ICD-10-CM

## 2016-06-04 DIAGNOSIS — I420 Dilated cardiomyopathy: Secondary | ICD-10-CM | POA: Diagnosis not present

## 2016-06-04 DIAGNOSIS — I2510 Atherosclerotic heart disease of native coronary artery without angina pectoris: Secondary | ICD-10-CM | POA: Diagnosis not present

## 2016-06-04 DIAGNOSIS — E0821 Diabetes mellitus due to underlying condition with diabetic nephropathy: Secondary | ICD-10-CM | POA: Diagnosis not present

## 2016-06-04 LAB — POCT GLUCOSE (DEVICE FOR HOME USE): POC Glucose: 195 mg/dl — AB (ref 70–99)

## 2016-06-04 NOTE — Progress Notes (Signed)
Pre visit review using our clinic review tool, if applicable. No additional management support is needed unless otherwise documented below in the visit note. 

## 2016-06-04 NOTE — Progress Notes (Signed)
Subjective:    Patient ID: [name] Brenner., male    DOB: Nov 06, 1930, 82 y.o.   MRN: [id number]  HPI  82 year old patient who has multiple medical problems including diabetes.  He is seen today due to worsening hyperglycemia.  He states he takes blood sugars about 10 AM and blood sugars are occasionally over 400.  His last hemoglobin A1c at this office was in March and was 7.3.  He is followed closely by nephrology and last month.  Hemoglobin A1c was 7.4.  He is on 80 units of 75/25 insulin.  He takes the insulin with breakfast and his evening meal.  No hypoglycemic symptoms He has chronic kidney disease and has been on hemodialysis since May.  He states that he feels better than he has in a number of years. He has a history of dilated cardiomyopathy which has been stable.  He is now seen by cardiology only annually. Denies any symptoms of congestive heart failure  He receives hemodialysis Monday, Wednesday, Friday Scheduled for eye examination in December  Past Medical History:  Diagnosis Date  . CARDIOMYOPATHY, PRIMARY, DILATED    a. Mixed ICM/NICM - EF 15% by echo 05/2009; St. Jude CRT-D implanted 06/2009. b. Echo 04/2013 - EF 20%, mild LVH.  [name] Chronic atrial fibrillation ([place])    a. Historically difficult rate control. b. s/p CRT-D implantation with anticipation of AV junction ablation but patient then was able to accomplish rate control and ablation not undertaken.; AVN ablation 08/2013 by Dr Lovena Le  . Chronic systolic CHF (congestive heart failure) ([place])   . CKD (chronic kidney disease), stage IV ([place])    a. Right brachiocephalic AV fistula [id number]. Neph = [place].  . Colon cancer ([place])   . CORONARY ARTERY DISEASE    a. BMS to LAD 04/2008. b. Cath 06/2009: nonobstructive disease. c. cath 05/16/2015 DES to prox LAD  . Diabetes mellitus   . Hyperlipidemia   . Hypertension   . Osteoarthritis   . Presence of permanent cardiac pacemaker   . Prostate cancer [facility])    a. s/p radical  prostatectomy.  . Pulmonary HTN ([place])    a. PA pressure 61mmHg by echo 05/01/13.  [name] Secondary hyperparathyroidism ([place])   . Skin cancer   . SLEEP APNEA    wears CPAP  . Tubular adenoma of colon   . Valvular heart disease    a. Echo 04/2013: mild AI, mild MR.  . Vertigo      Social History   Social History  . Marital status: Widowed    Spouse name: N/A  . Number of children: 4  . Years of education: N/A   Occupational History  . retired    Social History Main Topics  . Smoking status: Former Smoker    Types: Cigarettes    Quit date: 09/22/1966  . Smokeless tobacco: Never Used  . Alcohol use 0.0 - 0.6 oz/week     Comment: an occasional beer  . Drug use: No  . Sexual activity: No   Other Topics Concern  . Not on file   Social History Narrative  . No narrative on file    Past Surgical History:  Procedure Laterality Date  . ABLATION  08/2013   AVN ablation by Dr Lovena Le  . AV FISTULA PLACEMENT  03/30/2012   Procedure: ARTERIOVENOUS (AV) FISTULA CREATION;  Surgeon: Angelia Mould, MD;  Location: [place];  Service: Vascular;  Laterality: Right;  Creation of BrachioCephalic Fistula Right arm  .  AV NODE ABLATION N/A 09/12/2013   Procedure: AV NODE ABLATION;  Surgeon: [name], MD;  Location: [facility] CATH LAB;  Service: Cardiovascular;  Laterality: N/A;  . BI-VENTRICULAR IMPLANTABLE CARDIOVERTER DEFIBRILLATOR  (CRT-D)  2010   STJ CRTD implanted by Dr [name]  . CARDIAC CATHETERIZATION N/A 05/09/2015   Procedure: Left Heart Cath and Coronary Angiography;  Surgeon: Sherren Mocha, MD;  Location: [place] CV LAB;  Service: Cardiovascular;  Laterality: N/A;  . CARDIAC CATHETERIZATION N/A 05/16/2015   Procedure: Coronary Stent Intervention;  Surgeon: Sherren Mocha, MD;  Location: [place] CV LAB;  Service: Cardiovascular;  Laterality: N/A;  . COLECTOMY    . EYE SURGERY     left eye prothesis  . FRACTURE SURGERY     collar bone, right knee replacement  . INSERT / REPLACE  / REMOVE PACEMAKER    . INSERTION OF DIALYSIS CATHETER N/A 05/20/2016   Procedure: INSERTION OF DIALYSIS CATHETER;  Surgeon: Elam Dutch, MD;  Location: [place];  Service: Vascular;  Laterality: N/A;  . JOINT REPLACEMENT    . LIGATION OF ARTERIOVENOUS  FISTULA Right 05/20/2016   Procedure: LIGATION OF RIGHT ARTERIOVENOUS  FISTULA;  Surgeon: Elam Dutch, MD;  Location: [place];  Service: Vascular;  Laterality: Right;  . PACEMAKER INSERTION  2010  . PENILE PROSTHESIS PLACEMENT    . PROSTATECTOMY    . PTCA     stent placed  . removed prosthetic eye    . TOTAL KNEE ARTHROPLASTY  2008   right    Family History  Problem Relation Age of Onset  . Heart disease Father   . Throat cancer Father   . Throat cancer Mother   . Heart disease Mother   . Hypertension Mother     Allergies  Allergen Reactions  . Ace Inhibitors Other (See Comments)    Stopped by Dr. Burt Knack due to progressive renal failure  . Prednisone Other (See Comments)    Raised blood sugar to almost 900    Current Outpatient Prescriptions on File Prior to Visit  Medication Sig Dispense Refill  . acetaminophen (TYLENOL) 500 MG tablet Take 1,000 mg by mouth every 4 (four) hours as needed for mild pain.     [name] aspirin EC 81 MG tablet Take 1 tablet (81 mg total) by mouth daily. 90 tablet 3  . calcium carbonate (TUMS EX) 750 MG chewable tablet Chew 1 tablet by mouth 3 (three) times daily with meals.    . digoxin (DIGOX) 0.125 MG tablet Take 125 mcg by mouth every other day 45 tablet 1  . docusate sodium (COLACE) 100 MG capsule Take 300 mg by mouth daily.    [name] HUMALOG MIX 75/25 (75-25) 100 UNIT/ML SUSP injection Inject subcutaneously 26  units two times daily with  meals 5 vial 3  . hydrALAZINE (APRESOLINE) 25 MG tablet Take 25 mg by mouth See admin instructions. Take 25 mg by mouth twice daily on Sundays,tuesdays,Thursdays, and Saturdays.  Take 25 mg by mouth in the evening on Mondays,Wed,and Fridays    . isosorbide  mononitrate (IMDUR) 30 MG 24 hr tablet Take 1 tablet by mouth  daily 90 tablet 3  . metoprolol succinate (TOPROL-XL) 100 MG 24 hr tablet TAKE 50 MG BY MOUTH  TWICE A DAY, WITH OR  IMMEDIATELY FOLLOWING MEALS 180 tablet 3  . multivitamin (RENA-VIT) TABS tablet Take 1 tablet by mouth daily.    . nitroGLYCERIN (NITROSTAT) 0.4 MG SL tablet Place 0.4 mg under the tongue  every 5 (five) minutes as needed for chest pain.    [name] warfarin (COUMADIN) 5 MG tablet Take 5 mg daily except 2.5 mg on Mondays OR as directed by anticoagulation clinic. 90 tablet 1   No current facility-administered medications on file prior to visit.     BP 140/70 (BP Location: Left Arm, Patient Position: Sitting, Cuff Size: Normal)   Pulse 75   Temp 98.5 F (36.9 C) (Oral)   Resp 20   Ht 5\' 11"  (1.803 m)   Wt 185 lb 8 oz (84.1 kg)   SpO2 97%   BMI 25.87 kg/m      Review of Systems  Constitutional: Negative for appetite change, chills, fatigue and fever.  HENT: Negative for congestion, dental problem, ear pain, hearing loss, sore throat, tinnitus, trouble swallowing and voice change.   Eyes: Negative for pain, discharge and visual disturbance.  Respiratory: Negative for cough, chest tightness, wheezing and stridor.   Cardiovascular: Positive for leg swelling. Negative for chest pain and palpitations.  Gastrointestinal: Negative for abdominal distention, abdominal pain, blood in stool, constipation, diarrhea, nausea and vomiting.  Genitourinary: Negative for difficulty urinating, discharge, flank pain, genital sores, hematuria and urgency.  Musculoskeletal: Negative for arthralgias, back pain, gait problem, joint swelling, myalgias and neck stiffness.  Skin: Negative for rash.  Neurological: Negative for dizziness, syncope, speech difficulty, weakness, numbness and headaches.  Hematological: Negative for adenopathy. Does not bruise/bleed easily.  Psychiatric/Behavioral: Negative for behavioral problems and dysphoric mood.  The patient is not nervous/anxious.        Objective:   Physical Exam  Constitutional: He is oriented to person, place, and time. He appears well-developed.  Weight 185  HENT:  Head: Normocephalic.  Right Ear: External ear normal.  Left Ear: External ear normal.  Eyes: Conjunctivae and EOM are normal.  Neck: Normal range of motion.  Cardiovascular: Normal rate and normal heart sounds.   Pulmonary/Chest: Breath sounds normal. He has no rales.  Port-A-Cath left upper anterior chest wall  Abdominal: Bowel sounds are normal.  Musculoskeletal: Normal range of motion. He exhibits edema. He exhibits no tenderness.  Plus 1 lower extremity edema  Neurological: He is alert and oriented to person, place, and time.  Skin:  AV fistula left antecubital fossa  Psychiatric: He has a normal mood and affect. His behavior is normal.          Assessment & Plan:  Diabetes mellitus.  Hemoglobin A1c last month was 7.4, indicating reasonable control.  Have asked patient to check his blood sugar once or twice daily prior to breakfast or his evening meal.  He was also asked to take his insulin 1 hour prior to eating End-stage renal disease.  Follow-up nephrology Dilated cardiomyopathy.  Follow-up cardiology  Recheck here 2-3 months  [name] [name] Pilar Plate

## 2016-06-04 NOTE — Patient Instructions (Signed)
Check blood sugars prior to breakfast or prior to your evening meal Take your insulin 1 hour prior to meals  Recheck hemoglobin A1c in 2 months  Follow-up cardiology Follow-up ophthalmology  Return here in 3 months for follow-up

## 2016-06-05 ENCOUNTER — Ambulatory Visit (HOSPITAL_COMMUNITY)
Admission: RE | Admit: 2016-06-05 | Discharge: 2016-06-05 | Disposition: A | Discharge status: Home / Self Care | Payer: Medicare Other | Source: Ambulatory Visit | Attending: Internal Medicine | Admitting: Internal Medicine

## 2016-06-05 ENCOUNTER — Other Ambulatory Visit (HOSPITAL_COMMUNITY): Payer: Self-pay | Admitting: Nephrology

## 2016-06-05 DIAGNOSIS — I739 Peripheral vascular disease, unspecified: Secondary | ICD-10-CM

## 2016-06-06 ENCOUNTER — Encounter: Payer: Self-pay | Admitting: Vascular Surgery

## 2016-06-12 ENCOUNTER — Ambulatory Visit (INDEPENDENT_AMBULATORY_CARE_PROVIDER_SITE_OTHER): Payer: Medicare Other | Admitting: Vascular Surgery

## 2016-06-12 ENCOUNTER — Encounter: Payer: Self-pay | Admitting: Podiatry

## 2016-06-12 ENCOUNTER — Ambulatory Visit (INDEPENDENT_AMBULATORY_CARE_PROVIDER_SITE_OTHER): Payer: Medicare Other | Admitting: Podiatry

## 2016-06-12 ENCOUNTER — Encounter: Payer: Self-pay | Admitting: Vascular Surgery

## 2016-06-12 VITALS — BP 145/72 | HR 75 | Ht 71.0 in | Wt 181.0 lb

## 2016-06-12 DIAGNOSIS — B351 Tinea unguium: Secondary | ICD-10-CM

## 2016-06-12 DIAGNOSIS — Z992 Dependence on renal dialysis: Secondary | ICD-10-CM

## 2016-06-12 DIAGNOSIS — L600 Ingrowing nail: Secondary | ICD-10-CM | POA: Diagnosis not present

## 2016-06-12 DIAGNOSIS — M79673 Pain in unspecified foot: Secondary | ICD-10-CM | POA: Diagnosis not present

## 2016-06-12 DIAGNOSIS — N186 End stage renal disease: Secondary | ICD-10-CM

## 2016-06-12 NOTE — Progress Notes (Signed)
Subjective:  82 y.o. year old male IDDM presents for diabetic foot care. He has on going ingrown nails on both great toe that hurts unless trimmed out well in regular base.   He has multiple skin lesions on face and gets yearly check up by dermatologist. Recent A1C was 7.5 and on Dialysis with under 10% Kidney function.. Doing better since on dialysis.   Objective:  Dermatologic:  Thick dystrophic nails with ingrown nail on both great toes without open skin or inflammation.  Positive of red and brown macular lesions on right anterior shin. This was checked by his dermatologist and was noted   Vascular:  Dorsalis pedis pulses palpable on both feet.  Posterior tibial pulses are not palpable on both feet.  No edema or erythema, no ischemic changes noted.  Orthopedic:  Cavus type foot without other gross deformity.  Neurologic:  Compromised sensory perception with failed Monofilament testing.   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 Diabetic neuropathy. On Kidney Dialysis.  Treatment: All nails debrided. Ingrown nails removed from both great toes via trimming.

## 2016-06-12 NOTE — Patient Instructions (Signed)
Seen for hypertrophic and ingrown toe nails. All nails debrided. Removed ingrown parts of nail on both great toes. Return in 3 months or as needed.

## 2016-06-12 NOTE — Progress Notes (Signed)
Patient returns today after recent ligation of his right upper arm AV fistula. The clumsiness symptoms in his hand have improved. He does still have some numbness and tingling. Hopefully this will improve with time. He is currently dialyzing with a right-sided catheter. He has an AICD on the left side. He also has some tightness in his cast after walking long distances consistent with some element of mild claudication. He is on Coumadin.  Physical exam:  Vitals:   06/12/16 1519  BP: (!) 145/72  Pulse: 75  SpO2: 97%  Weight: 181 lb (82.1 kg)  Height: 5\' 11"  (1.803 m)    Right upper extremity: Palpable brachial pulse palpable pulse in the proximal fistula no audible flow distally  Left upper extremity: 2+ brachial absent radial pulse palpable AICD left chest  Lower extremity: 2+ femoral pulses, absent pedal pulses  Data: Patient had bilateral ABIs performed today. Toe pressures were approximately 50 on both sides and dampened. He had monophasic flow in the posterior tibial and dorsalis pedis bilaterally. Vessels were calcified with unreliable ABI.  Assessment: Difficult situation with the patient currently dialyzing via a catheter at high risk for infection of his AICD. His left upper extremity is not very good for access site because he will most likely developed venous hypertension due to the AICD leads. This leaves [place] with lower extremity accesses. He is at risk of problems in his feet with potential limb loss due to at least moderate arterial occlusive disease bilaterally.  Plan: We will place a left thigh graft 07/01/2016. We will stop his Coumadin 3 days prior to the procedure. I discussed with the patient today he would be admitted to the hospital overnight to make sure he has no problems with the circulation in his left leg. I also told him signs and symptoms to look for for worsening steel type symptoms in his left leg. He understands and agrees to proceed. Also discussed with him risks  benefits possible palpitations and procedure details including but not limited to infection ischemic steal graft thrombosis 25% per year. He understands. His daughter was also present for the conversation in the room today.  [name], MD [facility] Office: [phone] Pager: [phone]

## 2016-06-13 ENCOUNTER — Other Ambulatory Visit: Payer: Self-pay

## 2016-06-16 ENCOUNTER — Ambulatory Visit (INDEPENDENT_AMBULATORY_CARE_PROVIDER_SITE_OTHER): Payer: Medicare Other | Admitting: *Deleted

## 2016-06-16 ENCOUNTER — Telehealth: Payer: Self-pay | Admitting: Cardiovascular Disease

## 2016-06-16 DIAGNOSIS — I420 Dilated cardiomyopathy: Secondary | ICD-10-CM | POA: Diagnosis not present

## 2016-06-16 NOTE — Progress Notes (Signed)
Remote ICD transmission.   

## 2016-06-16 NOTE — Telephone Encounter (Signed)
Request for surgical clearance:  1. What type of surgery is being performed? Left Thigh Graft   2. When is this surgery scheduled? 07-01-16   3. Are there any medications that need to be held prior to surgery and how long? Can he stop his Coumadin on 06-27-16?   4. Name of physician performing surgery? Dr [name]   5. What is your office phone and fax number? [phone] and fax is [phone]

## 2016-06-16 NOTE — Telephone Encounter (Signed)
Okay to hold Coumadin as requested.

## 2016-06-17 NOTE — Telephone Encounter (Signed)
Telephone encounter faxed to [phone], Attn:Stephanie.

## 2016-06-18 ENCOUNTER — Encounter: Payer: Self-pay | Admitting: Cardiology

## 2016-06-18 LAB — PROTIME-INR: INR: 1.6 — AB (ref <=1.1)

## 2016-06-20 ENCOUNTER — Ambulatory Visit: Payer: Self-pay | Admitting: General Practice

## 2016-06-20 DIAGNOSIS — Z5181 Encounter for therapeutic drug level monitoring: Secondary | ICD-10-CM

## 2016-06-20 DIAGNOSIS — I482 Chronic atrial fibrillation, unspecified: Secondary | ICD-10-CM

## 2016-06-20 NOTE — Progress Notes (Signed)
I have reviewed and agree with the plan. 

## 2016-06-30 ENCOUNTER — Encounter (HOSPITAL_COMMUNITY): Payer: Self-pay | Admitting: *Deleted

## 2016-06-30 NOTE — Anesthesia Preprocedure Evaluation (Addendum)
Anesthesia Evaluation  Patient identified by MRN, date of birth, ID band Patient awake    Reviewed: Allergy & Precautions, NPO status , Patient's Chart, lab work & pertinent test results, reviewed documented beta blocker date and time   History of Anesthesia Complications Negative for: history of anesthetic complications  Airway Mallampati: II  TM Distance: >3 FB Neck ROM: Full    Dental  (+) Dental Advisory Given, Edentulous Upper, Edentulous Lower   Pulmonary shortness of breath, sleep apnea and Continuous Positive Airway Pressure Ventilation , former smoker,    Pulmonary exam normal breath sounds clear to auscultation       Cardiovascular Exercise Tolerance: Poor hypertension, Pt. on home beta blockers and Pt. on medications + CAD, + Cardiac Stents (s/p non-DES LAD 05/01/08 and DES to proximal LAD 05/09/15) and +CHF  Normal cardiovascular exam+ pacemaker + Cardiac Defibrillator  Rhythm:Regular Rate:Normal  EKG 12/18/15:  Ventricular paced rhythm.  LHC 05/16/15:  PCI 3.5 x 15 mm Resolute DES to the proximal LAD, scoring balloon angioplasty  LHC 05/09/15: LAD: Proximal 75%, mid stent 25% ISR, small ostial D1 80% (small vessel) LCx: Irregularities RCA: Mid 50% Preserved cardiac output and well-compensated hemodynamics. Recommend: Staged PCI of LAD (due to advance CKD).  Echo 12/06/14: Mild LVH, EF 25-30%, diffuse HK, aortic sclerosis without stenosis, mild AI, mild MR, mild LAE, mild RAE, PASP 48 mmHg (Comparison EF 20-25% 09/11/13.)  Carotid US 05/01/13: Bilateral ICA 1-39%   Neuro/Psych negative neurological ROS  negative psych ROS   GI/Hepatic negative GI ROS, Neg liver ROS,   Endo/Other  diabetes, Insulin Dependent  Renal/GU ESRF and DialysisRenal disease     Musculoskeletal negative musculoskeletal ROS (+)   Abdominal   Peds  Hematology  (+) Blood dyscrasia (coumadin therapy), ,   Anesthesia Other  Findings Day of surgery medications reviewed with the patient.  Reproductive/Obstetrics                            Anesthesia Physical Anesthesia Plan  ASA: IV  Anesthesia Plan: General   Post-op Pain Management:    Induction: Intravenous  Airway Management Planned: LMA  Additional Equipment:   Intra-op Plan:   Post-operative Plan: Extubation in OR  Informed Consent: I have reviewed the patients History and Physical, chart, labs and discussed the procedure including the risks, benefits and alternatives for the proposed anesthesia with the patient or authorized representative who has indicated his/her understanding and acceptance.   Dental advisory given  Plan Discussed with: CRNA  Anesthesia Plan Comments: (Risks/benefits of general anesthesia discussed with patient including risk of damage to teeth, lips, gum, and tongue, nausea/vomiting, allergic reactions to medications, and the possibility of heart attack, stroke and death.  All patient questions answered.  Patient wishes to proceed.)        Anesthesia Quick Evaluation

## 2016-07-01 ENCOUNTER — Inpatient Hospital Stay (HOSPITAL_COMMUNITY)
Admission: RE | Admit: 2016-07-01 | Discharge: 2016-07-02 | DRG: 252 | Disposition: A | Discharge status: Home / Self Care | Payer: Medicare Other | Source: Ambulatory Visit | Attending: Vascular Surgery | Admitting: Vascular Surgery

## 2016-07-01 ENCOUNTER — Encounter (HOSPITAL_COMMUNITY)
Admission: RE | Disposition: A | Discharge status: Home / Self Care | Payer: Self-pay | Source: Ambulatory Visit | Attending: Vascular Surgery

## 2016-07-01 ENCOUNTER — Inpatient Hospital Stay (HOSPITAL_COMMUNITY): Payer: Medicare Other | Admitting: Certified Registered Nurse Anesthetist

## 2016-07-01 ENCOUNTER — Encounter (HOSPITAL_COMMUNITY): Payer: Self-pay | Admitting: Certified Registered Nurse Anesthetist

## 2016-07-01 DIAGNOSIS — I272 Pulmonary hypertension, unspecified: Secondary | ICD-10-CM | POA: Diagnosis present

## 2016-07-01 DIAGNOSIS — I132 Hypertensive heart and chronic kidney disease with heart failure and with stage 5 chronic kidney disease, or end stage renal disease: Principal | ICD-10-CM | POA: Diagnosis present

## 2016-07-01 DIAGNOSIS — Z85038 Personal history of other malignant neoplasm of large intestine: Secondary | ICD-10-CM | POA: Diagnosis not present

## 2016-07-01 DIAGNOSIS — D649 Anemia, unspecified: Secondary | ICD-10-CM | POA: Diagnosis not present

## 2016-07-01 DIAGNOSIS — Z85828 Personal history of other malignant neoplasm of skin: Secondary | ICD-10-CM | POA: Diagnosis not present

## 2016-07-01 DIAGNOSIS — G4730 Sleep apnea, unspecified: Secondary | ICD-10-CM | POA: Diagnosis not present

## 2016-07-01 DIAGNOSIS — I482 Chronic atrial fibrillation: Secondary | ICD-10-CM | POA: Diagnosis not present

## 2016-07-01 DIAGNOSIS — I2510 Atherosclerotic heart disease of native coronary artery without angina pectoris: Secondary | ICD-10-CM | POA: Diagnosis not present

## 2016-07-01 DIAGNOSIS — Z96651 Presence of right artificial knee joint: Secondary | ICD-10-CM | POA: Diagnosis present

## 2016-07-01 DIAGNOSIS — Z8546 Personal history of malignant neoplasm of prostate: Secondary | ICD-10-CM | POA: Diagnosis not present

## 2016-07-01 DIAGNOSIS — Z794 Long term (current) use of insulin: Secondary | ICD-10-CM

## 2016-07-01 DIAGNOSIS — N2581 Secondary hyperparathyroidism of renal origin: Secondary | ICD-10-CM | POA: Diagnosis present

## 2016-07-01 DIAGNOSIS — E785 Hyperlipidemia, unspecified: Secondary | ICD-10-CM | POA: Diagnosis not present

## 2016-07-01 DIAGNOSIS — Z992 Dependence on renal dialysis: Secondary | ICD-10-CM | POA: Diagnosis not present

## 2016-07-01 DIAGNOSIS — Z8249 Family history of ischemic heart disease and other diseases of the circulatory system: Secondary | ICD-10-CM

## 2016-07-01 DIAGNOSIS — Z955 Presence of coronary angioplasty implant and graft: Secondary | ICD-10-CM | POA: Diagnosis not present

## 2016-07-01 DIAGNOSIS — Z95810 Presence of automatic (implantable) cardiac defibrillator: Secondary | ICD-10-CM | POA: Diagnosis not present

## 2016-07-01 DIAGNOSIS — E1122 Type 2 diabetes mellitus with diabetic chronic kidney disease: Secondary | ICD-10-CM | POA: Diagnosis present

## 2016-07-01 DIAGNOSIS — I5022 Chronic systolic (congestive) heart failure: Secondary | ICD-10-CM | POA: Diagnosis not present

## 2016-07-01 DIAGNOSIS — Z7901 Long term (current) use of anticoagulants: Secondary | ICD-10-CM

## 2016-07-01 DIAGNOSIS — N186 End stage renal disease: Secondary | ICD-10-CM | POA: Diagnosis not present

## 2016-07-01 DIAGNOSIS — Z9079 Acquired absence of other genital organ(s): Secondary | ICD-10-CM

## 2016-07-01 HISTORY — DX: Pneumonia, unspecified organism: J18.9

## 2016-07-01 HISTORY — DX: Dyspnea, unspecified: R06.00

## 2016-07-01 HISTORY — PX: AV FISTULA PLACEMENT: SHX1204

## 2016-07-01 HISTORY — DX: End stage renal disease: N18.6

## 2016-07-01 HISTORY — DX: Personal history of other medical treatment: Z92.89

## 2016-07-01 HISTORY — PX: VASCULAR SURGERY: SHX849

## 2016-07-01 LAB — PROTIME-INR
INR: 1.33
PROTHROMBIN TIME: 16.6 s — AB (ref 11.4–15.2)

## 2016-07-01 LAB — POCT I-STAT 4, (NA,K, GLUC, HGB,HCT)
Glucose, Bld: 242 mg/dL — ABNORMAL HIGH (ref 65–99)
HCT: 28 % — ABNORMAL LOW (ref 39.0–52.0)
Hemoglobin: 9.5 g/dL — ABNORMAL LOW (ref 13.0–17.0)
Potassium: 3.9 mmol/L (ref 3.5–5.1)
Sodium: 137 mmol/L (ref 135–145)

## 2016-07-01 LAB — GLUCOSE, CAPILLARY
GLUCOSE-CAPILLARY: 270 mg/dL — AB (ref 65–99)
Glucose-Capillary: 223 mg/dL — ABNORMAL HIGH (ref 65–99)
Glucose-Capillary: 247 mg/dL — ABNORMAL HIGH (ref 65–99)
Glucose-Capillary: 246 mg/dL — ABNORMAL HIGH (ref 65–99)

## 2016-07-01 LAB — APTT: APTT: 34 s (ref 24–36)

## 2016-07-01 SURGERY — INSERTION OF ARTERIOVENOUS (AV) GORE-TEX GRAFT THIGH
Anesthesia: General | Site: Leg Upper | Laterality: Left

## 2016-07-01 MED ORDER — RENA-VITE PO TABS
1.0000 | ORAL_TABLET | Freq: Every day | ORAL | Status: DC
  Administered 2016-07-02: 1 via ORAL
  Filled 2016-07-01: qty 1

## 2016-07-01 MED ORDER — SODIUM CHLORIDE 0.9 % IV SOLN
INTRAVENOUS | Status: DC | PRN
  Administered 2016-07-01: 500 mL

## 2016-07-01 MED ORDER — SODIUM CHLORIDE 0.9 % IV SOLN
INTRAVENOUS | Status: DC | PRN
  Administered 2016-07-01: 07:00:00 via INTRAVENOUS

## 2016-07-01 MED ORDER — ACETAMINOPHEN 325 MG PO TABS: 650.0000 mg | ORAL_TABLET | Freq: Once | ORAL | Status: DC

## 2016-07-01 MED ORDER — ASPIRIN EC 81 MG PO TBEC
81.0000 mg | DELAYED_RELEASE_TABLET | Freq: Every day | ORAL | Status: DC
  Administered 2016-07-01: 81 mg via ORAL
  Filled 2016-07-01 (×2): qty 1

## 2016-07-01 MED ORDER — HEMOSTATIC AGENTS (NO CHARGE) OPTIME
TOPICAL | Status: DC | PRN
  Administered 2016-07-01: 1 via TOPICAL

## 2016-07-01 MED ORDER — SODIUM CHLORIDE 0.9% FLUSH: 3.0000 mL | INTRAVENOUS | Status: DC | PRN

## 2016-07-01 MED ORDER — OXYCODONE-ACETAMINOPHEN 5-325 MG PO TABS: 1.0000 | ORAL_TABLET | Freq: Four times a day (QID) | ORAL | 0 refills | Status: DC | PRN

## 2016-07-01 MED ORDER — POTASSIUM CHLORIDE CRYS ER 20 MEQ PO TBCR: 20.0000 meq | EXTENDED_RELEASE_TABLET | Freq: Once | ORAL | Status: DC

## 2016-07-01 MED ORDER — PROTAMINE SULFATE 10 MG/ML IV SOLN
INTRAVENOUS | Status: DC | PRN
  Administered 2016-07-01: 30 mg via INTRAVENOUS
  Administered 2016-07-01: 20 mg via INTRAVENOUS

## 2016-07-01 MED ORDER — BISACODYL 5 MG PO TBEC: 5.0000 mg | DELAYED_RELEASE_TABLET | Freq: Every day | ORAL | Status: DC | PRN

## 2016-07-01 MED ORDER — DIGOXIN 125 MCG PO TABS
0.1250 mg | ORAL_TABLET | ORAL | Status: DC
  Filled 2016-07-01: qty 1

## 2016-07-01 MED ORDER — METOPROLOL TARTRATE 5 MG/5ML IV SOLN: 2.0000 mg | INTRAVENOUS | Status: DC | PRN

## 2016-07-01 MED ORDER — ACETAMINOPHEN 500 MG PO TABS
1000.0000 mg | ORAL_TABLET | ORAL | Status: DC | PRN
  Administered 2016-07-01 – 2016-07-02 (×3): 1000 mg via ORAL
  Filled 2016-07-01 (×3): qty 2

## 2016-07-01 MED ORDER — INSULIN ASPART PROT & ASPART (70-30 MIX) 100 UNIT/ML ~~LOC~~ SUSP
26.0000 [IU] | Freq: Two times a day (BID) | SUBCUTANEOUS | Status: DC
  Administered 2016-07-01 – 2016-07-02 (×2): 26 [IU] via SUBCUTANEOUS
  Filled 2016-07-01: qty 10

## 2016-07-01 MED ORDER — LIDOCAINE 2% (20 MG/ML) 5 ML SYRINGE
INTRAMUSCULAR | Status: AC
  Filled 2016-07-01: qty 5

## 2016-07-01 MED ORDER — GUAIFENESIN-DM 100-10 MG/5ML PO SYRP: 15.0000 mL | ORAL_SOLUTION | ORAL | Status: DC | PRN

## 2016-07-01 MED ORDER — FENTANYL CITRATE (PF) 100 MCG/2ML IJ SOLN
INTRAMUSCULAR | Status: DC | PRN
  Administered 2016-07-01 (×4): 25 ug via INTRAVENOUS

## 2016-07-01 MED ORDER — ALUM & MAG HYDROXIDE-SIMETH 200-200-20 MG/5ML PO SUSP: 15.0000 mL | ORAL | Status: DC | PRN

## 2016-07-01 MED ORDER — 0.9 % SODIUM CHLORIDE (POUR BTL) OPTIME
TOPICAL | Status: DC | PRN
  Administered 2016-07-01: 1000 mL

## 2016-07-01 MED ORDER — SODIUM CHLORIDE 0.9 % IV SOLN: 250.0000 mL | INTRAVENOUS | Status: DC | PRN

## 2016-07-01 MED ORDER — ONDANSETRON HCL 4 MG/2ML IJ SOLN
INTRAMUSCULAR | Status: DC | PRN
  Administered 2016-07-01: 4 mg via INTRAVENOUS

## 2016-07-01 MED ORDER — DEXTROSE 5 % IV SOLN
INTRAVENOUS | Status: DC | PRN
  Administered 2016-07-01: 1.5 g via INTRAVENOUS

## 2016-07-01 MED ORDER — ETOMIDATE 2 MG/ML IV SOLN
INTRAVENOUS | Status: AC
  Filled 2016-07-01: qty 10

## 2016-07-01 MED ORDER — SODIUM CHLORIDE 0.9% FLUSH
3.0000 mL | Freq: Two times a day (BID) | INTRAVENOUS | Status: DC
  Administered 2016-07-01: 3 mL via INTRAVENOUS

## 2016-07-01 MED ORDER — ONDANSETRON HCL 4 MG/2ML IJ SOLN: 4.0000 mg | Freq: Four times a day (QID) | INTRAMUSCULAR | Status: DC | PRN

## 2016-07-01 MED ORDER — MORPHINE SULFATE (PF) 2 MG/ML IV SOLN: 2.0000 mg | INTRAVENOUS | Status: DC | PRN

## 2016-07-01 MED ORDER — PHENYLEPHRINE HCL 10 MG/ML IJ SOLN
INTRAVENOUS | Status: DC | PRN
  Administered 2016-07-01: 25 ug/min via INTRAVENOUS

## 2016-07-01 MED ORDER — DOCUSATE SODIUM 100 MG PO CAPS
300.0000 mg | ORAL_CAPSULE | Freq: Every day | ORAL | Status: DC
  Administered 2016-07-01: 300 mg via ORAL
  Filled 2016-07-01 (×2): qty 3

## 2016-07-01 MED ORDER — SENNOSIDES-DOCUSATE SODIUM 8.6-50 MG PO TABS: 1.0000 | ORAL_TABLET | Freq: Every evening | ORAL | Status: DC | PRN

## 2016-07-01 MED ORDER — METOPROLOL SUCCINATE ER 100 MG PO TB24
100.0000 mg | ORAL_TABLET | Freq: Every day | ORAL | Status: DC
  Filled 2016-07-01: qty 1

## 2016-07-01 MED ORDER — PHENOL 1.4 % MT LIQD: 1.0000 | OROMUCOSAL | Status: DC | PRN

## 2016-07-01 MED ORDER — ETOMIDATE 2 MG/ML IV SOLN
INTRAVENOUS | Status: DC | PRN
  Administered 2016-07-01: 16 mg via INTRAVENOUS

## 2016-07-01 MED ORDER — LABETALOL HCL 5 MG/ML IV SOLN: 10.0000 mg | INTRAVENOUS | Status: DC | PRN

## 2016-07-01 MED ORDER — FENTANYL CITRATE (PF) 100 MCG/2ML IJ SOLN
25.0000 ug | INTRAMUSCULAR | Status: DC | PRN
  Administered 2016-07-01 (×2): 25 ug via INTRAVENOUS

## 2016-07-01 MED ORDER — HYDRALAZINE HCL 25 MG PO TABS: 25.0000 mg | ORAL_TABLET | ORAL | Status: DC

## 2016-07-01 MED ORDER — PROPOFOL 10 MG/ML IV BOLUS
INTRAVENOUS | Status: AC
  Filled 2016-07-01: qty 40

## 2016-07-01 MED ORDER — ISOSORBIDE MONONITRATE ER 30 MG PO TB24
30.0000 mg | ORAL_TABLET | Freq: Every day | ORAL | Status: DC
  Filled 2016-07-01: qty 1

## 2016-07-01 MED ORDER — FENTANYL CITRATE (PF) 100 MCG/2ML IJ SOLN
INTRAMUSCULAR | Status: AC
  Filled 2016-07-01: qty 2

## 2016-07-01 MED ORDER — HEPARIN SODIUM (PORCINE) 1000 UNIT/ML IJ SOLN
INTRAMUSCULAR | Status: AC
  Filled 2016-07-01: qty 1

## 2016-07-01 MED ORDER — PANTOPRAZOLE SODIUM 40 MG PO TBEC
40.0000 mg | DELAYED_RELEASE_TABLET | Freq: Every day | ORAL | Status: DC
  Administered 2016-07-01: 40 mg via ORAL
  Filled 2016-07-01 (×2): qty 1

## 2016-07-01 MED ORDER — INSULIN ASPART 100 UNIT/ML ~~LOC~~ SOLN
0.0000 [IU] | Freq: Three times a day (TID) | SUBCUTANEOUS | Status: DC
  Administered 2016-07-01: 3 [IU] via SUBCUTANEOUS
  Administered 2016-07-02: 2 [IU] via SUBCUTANEOUS

## 2016-07-01 MED ORDER — DIGOXIN 125 MCG PO TABS
0.1250 mg | ORAL_TABLET | Freq: Every day | ORAL | Status: DC
  Filled 2016-07-01: qty 1

## 2016-07-01 MED ORDER — HEPARIN SODIUM (PORCINE) 1000 UNIT/ML IJ SOLN
INTRAMUSCULAR | Status: DC | PRN
  Administered 2016-07-01: 5000 [IU] via INTRAVENOUS

## 2016-07-01 MED ORDER — LIDOCAINE 2% (20 MG/ML) 5 ML SYRINGE
INTRAMUSCULAR | Status: DC | PRN
  Administered 2016-07-01: 100 mg via INTRAVENOUS

## 2016-07-01 MED ORDER — OXYCODONE-ACETAMINOPHEN 5-325 MG PO TABS
1.0000 | ORAL_TABLET | ORAL | Status: DC | PRN
  Filled 2016-07-01 (×3): qty 2

## 2016-07-01 MED ORDER — ONDANSETRON HCL 4 MG/2ML IJ SOLN: 4.0000 mg | Freq: Once | INTRAMUSCULAR | Status: DC | PRN

## 2016-07-01 MED ORDER — CEFUROXIME SODIUM 1.5 G IJ SOLR
INTRAMUSCULAR | Status: AC
  Filled 2016-07-01: qty 1.5

## 2016-07-01 MED ORDER — HYDRALAZINE HCL 25 MG PO TABS
25.0000 mg | ORAL_TABLET | ORAL | Status: DC
  Filled 2016-07-01: qty 1

## 2016-07-01 MED ORDER — HYDRALAZINE HCL 20 MG/ML IJ SOLN: 5.0000 mg | INTRAMUSCULAR | Status: DC | PRN

## 2016-07-01 MED ORDER — NITROGLYCERIN 0.4 MG SL SUBL: 0.4000 mg | SUBLINGUAL_TABLET | SUBLINGUAL | Status: DC | PRN

## 2016-07-01 SURGICAL SUPPLY — 40 items
CANISTER SUCTION 2500CC (MISCELLANEOUS) ×3 IMPLANT
CANNULA VESSEL 3MM 2 BLNT TIP (CANNULA) IMPLANT
CLIP TI MEDIUM 6 (CLIP) ×3 IMPLANT
CLIP TI WIDE RED SMALL 6 (CLIP) ×3 IMPLANT
DERMABOND ADVANCED (GAUZE/BANDAGES/DRESSINGS) ×2
DERMABOND ADVANCED .7 DNX12 (GAUZE/BANDAGES/DRESSINGS) ×1 IMPLANT
DRAPE INCISE IOBAN 66X45 STRL (DRAPES) ×6 IMPLANT
ELECT REM PT RETURN 9FT ADLT (ELECTROSURGICAL) ×3
ELECTRODE REM PT RTRN 9FT ADLT (ELECTROSURGICAL) ×1 IMPLANT
GAUZE SPONGE 4X4 12PLY STRL (GAUZE/BANDAGES/DRESSINGS) ×3 IMPLANT
GEL ULTRASOUND 20GR AQUASONIC (MISCELLANEOUS) IMPLANT
GLOVE BIO SURGEON STRL SZ7.5 (GLOVE) ×6 IMPLANT
GLOVE BIOGEL PI IND STRL 7.5 (GLOVE) ×1 IMPLANT
GLOVE BIOGEL PI IND STRL 8 (GLOVE) ×1 IMPLANT
GLOVE BIOGEL PI INDICATOR 7.5 (GLOVE) ×2
GLOVE BIOGEL PI INDICATOR 8 (GLOVE) ×2
GLOVE ECLIPSE 7.0 STRL STRAW (GLOVE) ×3 IMPLANT
GLOVE SKINSENSE NS SZ7.0 (GLOVE) ×6
GLOVE SKINSENSE STRL SZ7.0 (GLOVE) ×3 IMPLANT
GOWN BRE IMP SLV AUR XL STRL (GOWN DISPOSABLE) ×12 IMPLANT
GOWN STRL REUS W/ TWL LRG LVL3 (GOWN DISPOSABLE) ×3 IMPLANT
GOWN STRL REUS W/TWL LRG LVL3 (GOWN DISPOSABLE) ×6
GRAFT GORETEX STRT 4-7X45 (Vascular Products) ×3 IMPLANT
HEMOSTAT SPONGE AVITENE ULTRA (HEMOSTASIS) ×3 IMPLANT
KIT BASIN OR (CUSTOM PROCEDURE TRAY) ×3 IMPLANT
KIT ROOM TURNOVER OR (KITS) ×3 IMPLANT
LIQUID BAND (GAUZE/BANDAGES/DRESSINGS) ×3 IMPLANT
LOOP VESSEL MAXI BLUE (MISCELLANEOUS) ×3 IMPLANT
LOOP VESSEL MINI RED (MISCELLANEOUS) IMPLANT
NS IRRIG 1000ML POUR BTL (IV SOLUTION) ×3 IMPLANT
PACK CV ACCESS (CUSTOM PROCEDURE TRAY) ×3 IMPLANT
PAD ARMBOARD 7.5X6 YLW CONV (MISCELLANEOUS) ×6 IMPLANT
SUT PROLENE 6 0 CC (SUTURE) ×12 IMPLANT
SUT VIC AB 2-0 SH 27 (SUTURE) ×2
SUT VIC AB 2-0 SH 27XBRD (SUTURE) ×1 IMPLANT
SUT VIC AB 3-0 SH 27 (SUTURE) ×4
SUT VIC AB 3-0 SH 27X BRD (SUTURE) ×2 IMPLANT
SUT VICRYL 4-0 PS2 18IN ABS (SUTURE) ×6 IMPLANT
UNDERPAD 30X30 (UNDERPADS AND DIAPERS) ×3 IMPLANT
WATER STERILE IRR 1000ML POUR (IV SOLUTION) ×3 IMPLANT

## 2016-07-01 NOTE — Anesthesia Procedure Notes (Signed)
Procedure Name: LMA Insertion Date/Time: 07/01/2016 7:39 AM Performed by: Everlean Cherry A Pre-anesthesia Checklist: Patient identified, Emergency Drugs available, Suction available and Patient being monitored Patient Re-evaluated:Patient Re-evaluated prior to inductionOxygen Delivery Method: Circle system utilized Preoxygenation: Pre-oxygenation with 100% oxygen Intubation Type: IV induction Ventilation: Mask ventilation without difficulty LMA: LMA inserted LMA Size: 4.0 Number of attempts: 1 Placement Confirmation: positive ETCO2 and breath sounds checked- equal and bilateral Tube secured with: Tape Dental Injury: Teeth and Oropharynx as per pre-operative assessment

## 2016-07-01 NOTE — Progress Notes (Addendum)
Pt transferred to [address]. Initial assessment, orientated pt to floor and room.   Pt understands that he needs to call before getting up. Pt is on bedrest with bathroom privileges- One assist recommended.   Daughter in law, visiting at bedside.   Skin intact, warm and dry. Dry scabs noted on back, trunk and stomach.    Pt wear glasses- with him.  Graft assessment:  left groin and thigh. Skin glue used on these two sites. Pedal pulses +1, pt has full sensation and can wiggle toes-bilateral, capillary refill is less than 3 seconds.    Pt is stable and pt reported 3/10 pain at the time but refused to take any pain mediaction. Call light and phone within reach. Will continue to monitor.    Paulla Fore, RN

## 2016-07-01 NOTE — Interval H&P Note (Signed)
History and Physical Interval Note:  07/01/2016 7:23 AM  [name] [name].  has presented today for surgery, with the diagnosis of End Stage Renal Disease N18.6  The various methods of treatment have been discussed with the patient and family. After consideration of risks, benefits and other options for treatment, the patient has consented to  Procedure(s): INSERTION OF ARTERIOVENOUS (AV) GORE-TEX GRAFT THIGH (Left) as a surgical intervention .  The patient's history has been reviewed, patient examined, no change in status, stable for surgery.  I have reviewed the patient's chart and labs.  Questions were answered to the patient's satisfaction.     [name]

## 2016-07-01 NOTE — Consult Note (Signed)
[facility] Renal Consultation Note    Indication for Consultation:  Management of ESRD/hemodialysis, anemia, hypertension/volume, and secondary hyperparathyroidism. PCP:  HPI: [name] [name]. is a 82 y.o. male with ESRD, CAD, cardiomyopathy (EF 15%), Hx pacemaker, Hx colon cancer, sleep apnea who was admitted after elective L thigh AVG placement.  Dialyzes MWF at [facility], last HD 10/9. No recent dialysis issues. Denies CP, dyspnea, N/V, diarrhea, edema, fever.  Past Medical History:  Diagnosis Date  . CARDIOMYOPATHY, PRIMARY, DILATED    a. Mixed ICM/NICM - EF 15% by echo 05/2009; [employer] CRT-D implanted 06/2009. b. Echo 04/2013 - EF 20%, mild LVH.  [name] Chronic atrial fibrillation ([place])    a. Historically difficult rate control. b. s/p CRT-D implantation with anticipation of AV junction ablation but patient then was able to accomplish rate control and ablation not undertaken.; AVN ablation 08/2013 by Dr Lovena Le  . Chronic systolic CHF (congestive heart failure) ([place])   . Colon cancer ([place])   . CORONARY ARTERY DISEASE    a. BMS to LAD 04/2008. b. Cath 06/2009: nonobstructive disease. c. cath 05/16/2015 DES to prox LAD  . Diabetes mellitus   . Dyspnea    with exertion  . ESRD (end stage renal disease) ([place])    M_W_F hemodialysis  . History of blood transfusion    after colon surgery  . Hyperlipidemia   . Hypertension   . Osteoarthritis   . Pneumonia 2014ish  . Presence of permanent cardiac pacemaker   . Prostate cancer [facility])    a. s/p radical prostatectomy.  . Pulmonary HTN    a. PA pressure 56mmHg by echo 05/01/13.  [name] Secondary hyperparathyroidism ([place])   . Skin cancer   . SLEEP APNEA    wears CPAP  . Tubular adenoma of colon   . Valvular heart disease    a. Echo 04/2013: mild AI, mild MR.  . Vertigo    Past Surgical History:  Procedure Laterality Date  . ABLATION  08/2013   AVN ablation by Dr Lovena Le  . AV FISTULA PLACEMENT  03/30/2012   Procedure:  ARTERIOVENOUS (AV) FISTULA CREATION;  Surgeon: Angelia Mould, MD;  Location: [facility];  Service: Vascular;  Laterality: Right;  Creation of BrachioCephalic Fistula Right arm  . AV NODE ABLATION N/A 09/12/2013   Procedure: AV NODE ABLATION;  Surgeon: [name], MD;  Location: [facility] CATH LAB;  Service: Cardiovascular;  Laterality: N/A;  . BI-VENTRICULAR IMPLANTABLE CARDIOVERTER DEFIBRILLATOR  (CRT-D)  2010   STJ CRTD implanted by Dr [name]  . CARDIAC CATHETERIZATION N/A 05/09/2015   Procedure: Left Heart Cath and Coronary Angiography;  Surgeon: Sherren Mocha, MD;  Location: [facility] CV LAB;  Service: Cardiovascular;  Laterality: N/A;  . CARDIAC CATHETERIZATION N/A 05/16/2015   Procedure: Coronary Stent Intervention;  Surgeon: Sherren Mocha, MD;  Location: [facility] CV LAB;  Service: Cardiovascular;  Laterality: N/A;  . COLECTOMY    . EYE SURGERY     left eye prothesis  . FRACTURE SURGERY     collar bone, right knee replacement  . INSERT / REPLACE / REMOVE PACEMAKER    . INSERTION OF DIALYSIS CATHETER N/A 05/20/2016   Procedure: INSERTION OF DIALYSIS CATHETER;  Surgeon: Elam Dutch, MD;  Location: [facility];  Service: Vascular;  Laterality: N/A;  . JOINT REPLACEMENT    . LIGATION OF ARTERIOVENOUS  FISTULA Right 05/20/2016   Procedure: LIGATION OF RIGHT ARTERIOVENOUS  FISTULA;  Surgeon: Elam Dutch, MD;  Location:  [facility] OR;  Service: Vascular;  Laterality: Right;  . PACEMAKER INSERTION  2010  . PENILE PROSTHESIS PLACEMENT    . PROSTATECTOMY    . PTCA     stent placed  . removed prosthetic eye    . TOTAL KNEE ARTHROPLASTY  2008   right   Family History  Problem Relation Age of Onset  . Heart disease Father   . Throat cancer Father   . Throat cancer Mother   . Heart disease Mother   . Hypertension Mother      reports that he quit smoking about 49 years ago. His smoking use included Cigarettes. He has never used smokeless tobacco. He reports that he drinks alcohol. He  reports that he does not use drugs. Allergies  Allergen Reactions  . Ace Inhibitors Other (See Comments)    Stopped by Dr. Burt Knack due to progressive renal failure  . Prednisone Other (See Comments)    Raised blood sugar to almost 900   Prior to Admission medications   Medication Sig Start Date End Date Taking? Authorizing Provider  acetaminophen (TYLENOL) 500 MG tablet Take 1,000 mg by mouth every 4 (four) hours as needed for mild pain.    Yes Historical Provider, MD  aspirin EC 81 MG tablet Take 1 tablet (81 mg total) by mouth daily. 11/09/15  Yes Liliane Shi, PA-C  calcium carbonate (TUMS EX) 750 MG chewable tablet Chew 1 tablet by mouth 3 (three) times daily with meals.   Yes Historical Provider, MD  digoxin (DIGOX) 0.125 MG tablet Take 125 mcg by mouth every other day 05/20/16  Yes Alvia Grove, PA-C  docusate sodium (COLACE) 100 MG capsule Take 300 mg by mouth daily.   Yes Historical Provider, MD  HUMALOG MIX 75/25 (75-25) 100 UNIT/ML SUSP injection Inject subcutaneously 26  units two times daily with  meals 11/27/15  Yes [name], MD  hydrALAZINE (APRESOLINE) 25 MG tablet Take 25 mg by mouth See admin instructions. Take 25 mg by mouth twice daily on Sundays,tuesdays,Thursdays, and Saturdays.  Take 25 mg by mouth in the evening on Mondays,Wed,and Fridays   Yes Historical Provider, MD  isosorbide mononitrate (IMDUR) 30 MG 24 hr tablet Take 1 tablet by mouth  daily 05/20/16  Yes Alvia Grove, PA-C  metoprolol succinate (TOPROL-XL) 100 MG 24 hr tablet TAKE 50 MG BY MOUTH  TWICE A DAY, WITH OR  IMMEDIATELY FOLLOWING MEALS 05/20/16  Yes Alvia Grove, PA-C  multivitamin (RENA-VIT) TABS tablet Take 1 tablet by mouth daily.   Yes Historical Provider, MD  nitroGLYCERIN (NITROSTAT) 0.4 MG SL tablet Place 0.4 mg under the tongue every 5 (five) minutes as needed for chest pain.   Yes Historical Provider, MD  warfarin (COUMADIN) 5 MG tablet Take 5 mg daily except 2.5 mg on Mondays  OR as directed by anticoagulation clinic. Patient taking differently: Take 2.5-5 mg by mouth See admin instructions. Take 5 mg daily except 2.5 mg on Mondays OR as directed by anticoagulation clinic. 05/30/16  Yes [name], MD  oxyCODONE-acetaminophen (PERCOCET/ROXICET) 5-325 MG tablet Take 1 tablet by mouth every 6 (six) hours as needed. 07/01/16   Ulyses Amor, PA-C   Current Facility-Administered Medications  Medication Dose Route Frequency Provider Last Rate Last Dose  . 0.9 %  sodium chloride infusion  250 mL Intravenous PRN Ulyses Amor, PA-C      . acetaminophen (TYLENOL) tablet 1,000 mg  1,000 mg Oral Q4H PRN Ulyses Amor,  PA-C      . alum & mag hydroxide-simeth (MAALOX/MYLANTA) 200-200-20 MG/5ML suspension 15-30 mL  15-30 mL Oral Q2H PRN Ulyses Amor, PA-C      . aspirin EC tablet 81 mg  81 mg Oral Daily Emma M Collins, PA-C      . bisacodyl (DULCOLAX) EC tablet 5 mg  5 mg Oral Daily PRN Ulyses Amor, PA-C      . digoxin [facility]) tablet 0.125 mg  0.125 mg Oral Daily Emma M Collins, PA-C      . docusate sodium (COLACE) capsule 300 mg  300 mg Oral Daily Ulyses Amor, PA-C      . fentaNYL (SUBLIMAZE) 100 MCG/2ML injection           . guaiFENesin-dextromethorphan (ROBITUSSIN DM) 100-10 MG/5ML syrup 15 mL  15 mL Oral Q4H PRN Ulyses Amor, PA-C      . hydrALAZINE (APRESOLINE) injection 5 mg  5 mg Intravenous Q20 Min PRN Ulyses Amor, PA-C      . hydrALAZINE (APRESOLINE) tablet 25 mg  25 mg Oral 2 times per day on Sun Tue Thu Sat Elam Dutch, MD      . Derrill Memo ON 07/02/2016] hydrALAZINE (APRESOLINE) tablet 25 mg  25 mg Oral Q M,W,F-1800 Elam Dutch, MD      . insulin aspart (novoLOG) injection 0-9 Units  0-9 Units Subcutaneous TID WC Ulyses Amor, PA-C      . insulin aspart protamine- aspart (NOVOLOG MIX 70/30) injection 26 Units  26 Units Subcutaneous BID WC Ulyses Amor, PA-C      . Derrill Memo ON 07/02/2016] isosorbide mononitrate (IMDUR) 24 hr tablet 30 mg   30 mg Oral Daily Emma M Collins, PA-C      . labetalol (NORMODYNE,TRANDATE) injection 10 mg  10 mg Intravenous Q10 min PRN Ulyses Amor, PA-C      . metoprolol (LOPRESSOR) injection 2-5 mg  2-5 mg Intravenous Q2H PRN Ulyses Amor, PA-C      . Derrill Memo ON 07/02/2016] metoprolol succinate (TOPROL-XL) 24 hr tablet 100 mg  100 mg Oral Daily Ulyses Amor, PA-C      . morphine 2 MG/ML injection 2-5 mg  2-5 mg Intravenous Q1H PRN Ulyses Amor, PA-C      . multivitamin (RENA-VIT) tablet 1 tablet  1 tablet Oral QHS Ulyses Amor, PA-C      . nitroGLYCERIN (NITROSTAT) SL tablet 0.4 mg  0.4 mg Sublingual Q5 min PRN Ulyses Amor, PA-C      . ondansetron [facility]) injection 4 mg  4 mg Intravenous Q6H PRN Ulyses Amor, PA-C      . oxyCODONE-acetaminophen (PERCOCET/ROXICET) 5-325 MG per tablet 1-2 tablet  1-2 tablet Oral Q4H PRN Ulyses Amor, PA-C      . pantoprazole (PROTONIX) EC tablet 40 mg  40 mg Oral Daily Emma M Collins, PA-C      . phenol (CHLORASEPTIC) mouth spray 1 spray  1 spray Mouth/Throat PRN Ulyses Amor, PA-C      . potassium chloride SA (K-DUR,KLOR-CON) CR tablet 20-40 mEq  20-40 mEq Oral Once Ulyses Amor, PA-C      . senna-docusate (Senokot-S) tablet 1 tablet  1 tablet Oral QHS PRN Ulyses Amor, PA-C      . sodium chloride flush (NS) 0.9 % injection 3 mL  3 mL Intravenous Q12H Ulyses Amor, PA-C      . sodium chloride flush (NS) 0.9 %  injection 3 mL  3 mL Intravenous PRN Ulyses Amor, PA-C        ROS: As per HPI otherwise negative.  Physical Exam: Vitals:   07/01/16 1230 07/01/16 1245 07/01/16 1300 07/01/16 1315  BP: (!) 102/52 (!) 98/45 (!) 111/47 (!) 119/55  Pulse: 74 75 75 74  Resp: 19 (!) 22 (!) 21 13  Temp:      SpO2: 98% 98% 97% 97%  Weight:         General: Well developed, well nourished, in no acute distress. Head: Normocephalic, atraumatic, sclera non-icteric, prosthetic left eye Neck: Supple without lymphadenopathy/masses. JVD not elevated. Lungs:  Clear bilaterally to auscultation without wheezes, rales, or rhonchi. Breathing is unlabored. Heart: RRR with normal S1, S2. 2/6 murmur LSB. ICD/pacer in left chest Abdomen: Soft, non-tender, non-distended with normoactive bowel sounds. No rebound/guarding. No obvious abdominal masses. Musculoskeletal:  Strength and tone appear normal for age. Lower extremities: No edema or ischemic changes, no open wounds. Scars RUE from prev AVF Neuro: Alert and oriented X 3. Moves all extremities spontaneously. Psych:  Responds to questions appropriately with a normal affect. Dialysis Access: PC in R chest. New L thigh AVG + bruit  Labs:   Recent Labs Lab 07/01/16 0626  NA 137  K 3.9  GLUCOSE 242*   CBC:  Recent Labs Lab 07/01/16 0626  HGB 9.5*  HCT 28.0*   CBG:  Recent Labs Lab 07/01/16 [place]    Dialysis Orders: Center: MWF at [facility] 4 hours, EDW 82.5kg, BFR 400/DFR 800, 2K/2Ca bath, PC - Hectoral 25mcg IV q HD - Venofer 51mcg IV weekly - Mircera 53mcg IV q 2 weeks (last given 9/27)  Assessment/Plan: 1.  Access surgery: S/p new L thigh AVG by Dr. [place] Alar on 10/10. Monitor for steal. 2.  ESRD:  HD MWF, next 10/11. No heparin. 3.  Hypertension/volume: BP ok, continue EDW. 4.  Anemia: Hgb 9.5; monitor. 5.  Metabolic bone disease: Continue outpt binders/VDRA.  Veneta Penton, PA-C 07/01/2016, 2:07 PM  [facility] Pager: [phone]  I have seen and examined this patient and agree with plan and assessment in the above note with renal recommendations/intervention highlighted.  S/p left thigh AVG with no foot pain, warn foot, clean incision, good bruit.  Plan for HD tomorrow.   Kelcie Currie B,MD 07/01/2016 3:10 PM

## 2016-07-01 NOTE — Op Note (Signed)
Procedure: Left Thigh AV graft  Preoperative diagnosis: End-stage renal disease  Postoperative diagnosis: Same  Anesthesia: Gen.  Assistant: Gerri Lins, PA-C  Operative findings: 4-7 mm PTFE graft to common femoral artery end to side and saphenofemoral junction end to side  Operative details: After obtaining informed consent, the patient was taken to the operating room. The patient was placed in supine position operating table. After induction of general anesthesia and endotracheal intubation the patient's entire left anterior thigh and groin were prepped and draped in usual sterile fashion. Next an oblique incision was made in the left groin crease and carried down through the subcutaneous tissues to the level of the saphenous femoral junction. Greater saphenous vein was dissected free for several centimeters and several small side branches ligated and divided between silk ties. The anterior surface of the left common femoral vein was also dissected free in preparation for clamping. Next, the left common femoral artery was dissected free circumferentially. The superficial femoral artery and profunda were dissected free circumferentially for several centimeters after it's origin. The common femoral and SFA were severely thickened and calcified.  There was a high takeoff of the profunda.  Vessel loops were placed proximal and distal the planned site of arteriotomy. Next a subcutaneous tunnel was created in a loop configuration on the anterior aspect of the left thigh. The lateral aspect of the graft was the arterial limb the medial aspect was the venous limb. A transverse incision was made in the distal thigh for assistance in tunneling. The patient was then given 5000 units of intravenous heparin. Henley clamps were used to control the superficial femoral artery proximally and distally. The graft was beveled on the arterial end and sewn end of graft to side of artery using running 6-0 Prolene  suture. Just prior to completion of the anastomosis it was forebled backbled and thoroughly flushed.   The anastomosis was secured; vesseloops were released; and there was pulsatile flow in the graft immediately. There was also still pulsatile flow in the superficial femoral artery below this. Attention was then turned to the venous anastomosis. The graft was pulled taut to length. The distal saphenous vein was ligated with a 2 0 silk tie.  The common femoral and was controlled proximally with a fistula clamp. The saphenous vein was transected and opened longitudinally from the proximal sapenous into the common femoral vein.   The graft was cut to length and beveled and sewn end to end to the saphenofemoral junction with the tip of the graft extending out onto the common femoral vein. This was done with a running 6-0 Prolene suture. Just prior completion anastomosis this was forebled backbled and thoroughly flushed.   The anastomosis was secured,  clamps released, and there was a palpable thrill in the vein immediately. Hemostasis was obtained with avitene as well as 50 mg protamine. Subcutaneous tissues of both incisions were closed with a running 3-0 Vicryl suture. The skin of both incisions was closed with 4 Vicryl subcuticular stitch. Dermabond was applied to both incisions. Patient tolerated the procedure well and there were no complications. Instrument, sponge, and needle counts were correct at the end of the case. The patient was taken to the recovery room in stable condition.  [name], MD [facility] Office: [phone] Pager: [phone]

## 2016-07-01 NOTE — Transfer of Care (Signed)
Immediate Anesthesia Transfer of Care Note  Patient: [name] [name].  Procedure(s) Performed: Procedure(s): INSERTION OF ARTERIOVENOUS (AV) GORE-TEX GRAFT Left THIGH (Left)  Patient Location: PACU  Anesthesia Type:General  Level of Consciousness: awake, alert , oriented and patient cooperative  Airway & Oxygen Therapy: Patient Spontanous Breathing and Patient connected to nasal cannula oxygen  Post-op Assessment: Report given to RN and Post -op Vital signs reviewed and stable  Post vital signs: Reviewed and stable  Last Vitals:  Vitals:   07/01/16 0621  BP: (!) 128/59  Pulse: 74  Resp: 18  Temp: 37 C    Last Pain: There were no vitals filed for this visit.       Complications: No apparent anesthesia complications

## 2016-07-01 NOTE — H&P (View-Only) (Signed)
Patient returns today after recent ligation of his right upper arm AV fistula. The clumsiness symptoms in his hand have improved. He does still have some numbness and tingling. Hopefully this will improve with time. He is currently dialyzing with a right-sided catheter. He has an AICD on the left side. He also has some tightness in his cast after walking long distances consistent with some element of mild claudication. He is on Coumadin.  Physical exam:  Vitals:   06/12/16 1519  BP: (!) 145/72  Pulse: 75  SpO2: 97%  Weight: 181 lb (82.1 kg)  Height: 5\' 11"  (1.803 m)    Right upper extremity: Palpable brachial pulse palpable pulse in the proximal fistula no audible flow distally  Left upper extremity: 2+ brachial absent radial pulse palpable AICD left chest  Lower extremity: 2+ femoral pulses, absent pedal pulses  Data: Patient had bilateral ABIs performed today. Toe pressures were approximately 50 on both sides and dampened. He had monophasic flow in the posterior tibial and dorsalis pedis bilaterally. Vessels were calcified with unreliable ABI.  Assessment: Difficult situation with the patient currently dialyzing via a catheter at high risk for infection of his AICD. His left upper extremity is not very good for access site because he will most likely developed venous hypertension due to the AICD leads. This leaves [place] with lower extremity accesses. He is at risk of problems in his feet with potential limb loss due to at least moderate arterial occlusive disease bilaterally.  Plan: We will place a left thigh graft 07/01/2016. We will stop his Coumadin 3 days prior to the procedure. I discussed with the patient today he would be admitted to the hospital overnight to make sure he has no problems with the circulation in his left leg. I also told him signs and symptoms to look for for worsening steel type symptoms in his left leg. He understands and agrees to proceed. Also discussed with him risks  benefits possible palpitations and procedure details including but not limited to infection ischemic steal graft thrombosis 25% per year. He understands. His daughter was also present for the conversation in the room today.  [name], MD [facility] [place] Office: [phone] Pager: [phone]

## 2016-07-02 ENCOUNTER — Telehealth: Payer: Self-pay | Admitting: Internal Medicine

## 2016-07-02 ENCOUNTER — Encounter (HOSPITAL_COMMUNITY): Payer: Self-pay | Admitting: Vascular Surgery

## 2016-07-02 DIAGNOSIS — I132 Hypertensive heart and chronic kidney disease with heart failure and with stage 5 chronic kidney disease, or end stage renal disease: Secondary | ICD-10-CM | POA: Diagnosis not present

## 2016-07-02 LAB — RENAL FUNCTION PANEL
ALBUMIN: 3.4 g/dL — AB (ref 3.5–5.0)
ANION GAP: 15 (ref 5–15)
BUN: 56 mg/dL — ABNORMAL HIGH (ref 6–20)
CALCIUM: 8.9 mg/dL (ref 8.9–10.3)
CO2: 25 mmol/L (ref 22–32)
Chloride: 97 mmol/L — ABNORMAL LOW (ref 101–111)
Creatinine, Ser: 6.67 mg/dL — ABNORMAL HIGH (ref 0.61–1.24)
GFR calc non Af Amer: 7 mL/min — ABNORMAL LOW (ref >60)
GFR, EST AFRICAN AMERICAN: 8 mL/min — AB (ref >60)
GLUCOSE: 151 mg/dL — AB (ref 65–99)
PHOSPHORUS: 7.2 mg/dL — AB (ref 2.5–4.6)
POTASSIUM: 4.1 mmol/L (ref 3.5–5.1)
SODIUM: 137 mmol/L (ref 135–145)

## 2016-07-02 LAB — MRSA PCR SCREENING: MRSA by PCR: NEGATIVE

## 2016-07-02 LAB — CBC
HEMATOCRIT: 29.1 % — AB (ref 39.0–52.0)
HEMOGLOBIN: 9.5 g/dL — AB (ref 13.0–17.0)
MCH: 33.7 pg (ref 26.0–34.0)
MCHC: 32.6 g/dL (ref 30.0–36.0)
MCV: 103.2 fL — ABNORMAL HIGH (ref 78.0–100.0)
Platelets: 127 10*3/uL — ABNORMAL LOW (ref 150–400)
RBC: 2.82 MIL/uL — AB (ref 4.22–5.81)
RDW: 15.3 % (ref 11.5–15.5)
WBC: 8.7 10*3/uL (ref 4.0–10.5)

## 2016-07-02 LAB — GLUCOSE, CAPILLARY
GLUCOSE-CAPILLARY: 185 mg/dL — AB (ref 65–99)
GLUCOSE-CAPILLARY: 192 mg/dL — AB (ref 65–99)

## 2016-07-02 MED ORDER — ACETAMINOPHEN 325 MG PO TABS
ORAL_TABLET | ORAL | Status: AC
  Administered 2016-07-02: 650 mg
  Filled 2016-07-02: qty 2

## 2016-07-02 MED ORDER — WARFARIN SODIUM 5 MG PO TABS: 2.5000 mg | ORAL_TABLET | ORAL | Status: AC

## 2016-07-02 MED ORDER — ACETAMINOPHEN 325 MG PO TABS: 650.0000 mg | ORAL_TABLET | Freq: Once | ORAL | Status: AC

## 2016-07-02 NOTE — Progress Notes (Signed)
  [facility] Progress Note   Subjective:  Seen in room. S/p new L thigh AVG yesterday. No pain, numbness, tingling of the L leg. Foot remains warm. Denies CP or dyspnea. Plan is for dialysis today and then discharge.  Objective Vitals:   07/01/16 1441 07/01/16 1800 07/01/16 2055 07/02/16 0500  BP: (!) 112/51 (!) 102/41 (!) 106/44 (!) 111/41  Pulse: 76 77 74 73  Resp: 20 20 20 18   Temp: 98.4 F (36.9 C) 98.6 F (37 C)  98.5 F (36.9 C)  TempSrc: Oral Oral  Oral  SpO2: 95% 94% 94% 98%  Weight:   89.8 kg (198 lb)    Physical Exam General: Well nourshed; NAD. Heart: RRR; 2/6 systolic murmur. ICD/pacer in L chest. Lungs: CTAB. Extremities: No LE edema. Dialysis Access:  PC in R chest and new L thigh AVG + bruit  Additional Objective Labs: Basic Metabolic Panel:  Recent Labs Lab 07/01/16 0626  NA 137  K 3.9  GLUCOSE 242*   CBC:  Recent Labs Lab 07/01/16 0626  HGB 9.5*  HCT 28.0*   CBG:  Recent Labs Lab 07/01/16 1029 07/01/16 1459 07/01/16 1706 07/01/16 2057 07/02/16 0754  GLUCAP 223* 270* 247* 246* 185*   Medications:   . aspirin EC  81 mg Oral Daily  . digoxin  0.125 mg Oral Q48H  . docusate sodium  300 mg Oral Daily  . hydrALAZINE  25 mg Oral 2 times per day on Sun Tue Thu Sat  . hydrALAZINE  25 mg Oral Q M,W,F-1800  . insulin aspart  0-9 Units Subcutaneous TID WC  . insulin aspart protamine- aspart  26 Units Subcutaneous BID WC  . isosorbide mononitrate  30 mg Oral Daily  . metoprolol succinate  100 mg Oral Daily  . multivitamin  1 tablet Oral QHS  . pantoprazole  40 mg Oral Daily  . potassium chloride  20-40 mEq Oral Once  . sodium chloride flush  3 mL Intravenous Q12H    Dialysis Orders: Center: MWF at [facility] 4 hours, EDW 82.5kg, BFR 400/DFR 800, 2K/2Ca bath, PC - Hectoral 74mcg IV q HD - Venofer 32mcg IV weekly - Mircera 42mcg IV q 2 weeks (last given 9/27)  Assessment/Plan:  1.  Access surgery: S/p new  L thigh AVG by Dr. Oneida Alar on 10/10. No steal symptoms so far. 2.  ESRD: HD today, per MWF schedule. Using PC for now. No heparin. 3.  Hypertension/volume: BP ok, continue EDW. 4.  Anemia: Hgb 9.5; monitor. 5.  Metabolic bone disease: Continue outpt binders/VDRA.  Veneta Penton, PA-C 07/02/2016, 9:26 AM  [facility] Pager: [phone]  I have seen and examined this patient and agree with plan and assessment in the above note with renal recommendations/intervention highlighted. L thogh AVG patent, some bruising, no steal.  BP's a little soft and has been taking his metoprolol twice a day on HD days - asked him to reduce to once a day after HD, twice a day other days. No change in hydralazine.  Also - his usual dose of metoprolol is 100 mg 1/2 tab twice a day - not 100 mg once a day.  Esiquio Boesen B,MD 07/02/2016 1:59 PM

## 2016-07-02 NOTE — Progress Notes (Addendum)
Patient Discharge: Disposition: Patient discharged to home. Education: Reviewed medications, prescriptions, follow-up appointments and discharge instructions, understood and acknowledged. IV: Discontinue IV before discharge. Telemetry: Discontinue before discharge.  CCMD notified. Transportation: Patient escorted out of the unit. Belongings: Patient took all his belongings with him.  ED Case Manager brought the cane and gave to the patient before discharge.

## 2016-07-02 NOTE — Procedures (Signed)
  I have personally attended this patient's dialysis session.   TDC 400 No heparin 2K bath Labs pending  Jamal Maes, MD [facility] [phone] Pager 07/02/2016, 2:05 PM

## 2016-07-02 NOTE — Telephone Encounter (Signed)
Kim a PA with [facility] called to request pt have an INR check this week due to pt having surgery and being dc'd today. She is requesting pt have this done eitherThursday or Friday.  We do not have a clinic these days. Please advise on when ok to schedule. Thanks!  His dr is dr Raliegh Ip.   I advised Maudie Mercury I would contact pt at his home later today and schedule this INR.

## 2016-07-02 NOTE — Progress Notes (Addendum)
  [facility] Progress Note  Subjective  - POD #1  Some soreness with left thigh incisions. Denies any pain with his left foot.   Objective Vitals:   07/01/16 2055 07/02/16 0500  BP: (!) 106/44 (!) 111/41  Pulse: 74 73  Resp: 20 18  Temp:  98.5 F (36.9 C)    Intake/Output Summary (Last 24 hours) at 07/02/16 0815 Last data filed at 07/02/16 0804  Gross per 24 hour  Intake              710 ml  Output              540 ml  Net              170 ml   Left groin and distal thigh incisions clean and intact. No hematoma. Palpable thrill and audible bruit left AV thigh graft.  Left foot warm. Sensation and motor intact.   Assessment/Planning: 82 y.o. male is s/p: left AV thigh graft 1 Day Post-Op   Left thigh graft is patent. No signs of steal syndrome. For HD this am. D/c after HD.  Ok to resume warfarin today. Will set up INR f/u.   Alvia Grove 07/02/2016 8:15 AM -- Agree with above D/c home post dialysis  [name], MD [facility] [place]: [phone] Pager: [phone]  Laboratory CBC    Component Value Date/Time   WBC 4.4 02/06/2016 2107   HGB 9.5 (L) 07/01/2016 0626   HCT 28.0 (L) 07/01/2016 0626   PLT 101 (L) 02/06/2016 2107    BMET    Component Value Date/Time   NA 137 07/01/2016 0626   K 3.9 07/01/2016 0626   CL 108 12/19/2015 0237   CO2 23 12/19/2015 0237   GLUCOSE 242 (H) 07/01/2016 0626   BUN 74 (H) 12/19/2015 0237   CREATININE 5.13 (H) 12/19/2015 0237   CALCIUM 9.4 12/19/2015 0237   CALCIUM 8.9 04/14/2008 0500   GFRNONAA 9 (L) 12/19/2015 0237   GFRAA 11 (L) 12/19/2015 0237    COAG Lab Results  Component Value Date   INR 1.33 07/01/2016   INR 1.6 (A) 06/18/2016   INR 1.34 05/20/2016   No results found for: PTT  Antibiotics Anti-infectives    Start     Dose/Rate Route Frequency Ordered Stop   07/01/16 0703  dextrose 5 % with cefUROXime (ZINACEF) ADS Med    Comments:   Maryjean Ka   : cabinet override      07/01/16 0703 07/01/16 [place], PA-C [facility] Office: [phone] Pager: [phone] 07/02/2016 8:15 AM

## 2016-07-02 NOTE — Progress Notes (Signed)
Patient refused to take any morning pills which were on hold d/t dialysis, also refused to check CBG and said he will go home and take all his pills and check his CBG.

## 2016-07-02 NOTE — Telephone Encounter (Signed)
Patient has been contacted and Coumadin clinic appointment has been scheduled.

## 2016-07-03 NOTE — Anesthesia Postprocedure Evaluation (Signed)
Anesthesia Post Note  Patient: Sarath Sullinger.  Procedure(s) Performed: Procedure(s) (LRB): INSERTION OF ARTERIOVENOUS (AV) GORE-TEX GRAFT Left THIGH (Left)  Patient location during evaluation: PACU Anesthesia Type: General Level of consciousness: awake and alert Pain management: pain level controlled Vital Signs Assessment: post-procedure vital signs reviewed and stable Respiratory status: spontaneous breathing, nonlabored ventilation, respiratory function stable and patient connected to nasal cannula oxygen Cardiovascular status: blood pressure returned to baseline and stable Postop Assessment: no signs of nausea or vomiting Anesthetic complications: no    Last Vitals:  Vitals:   07/02/16 1722 07/02/16 1759  BP: (!) 120/52 (!) 134/47  Pulse: 75 76  Resp: (!) (P) 21 16  Temp: 36.8 C 36.9 C    Last Pain:  Vitals:   07/02/16 1759  TempSrc: Oral  PainSc:                  Catalina Gravel

## 2016-07-06 ENCOUNTER — Encounter (HOSPITAL_BASED_OUTPATIENT_CLINIC_OR_DEPARTMENT_OTHER): Payer: Self-pay | Admitting: Emergency Medicine

## 2016-07-06 ENCOUNTER — Emergency Department (HOSPITAL_BASED_OUTPATIENT_CLINIC_OR_DEPARTMENT_OTHER): Payer: Medicare Other

## 2016-07-06 ENCOUNTER — Emergency Department (HOSPITAL_BASED_OUTPATIENT_CLINIC_OR_DEPARTMENT_OTHER)
Admission: EM | Admit: 2016-07-06 | Discharge: 2016-07-06 | Disposition: A | Discharge status: Home / Self Care | Payer: Medicare Other | Attending: Emergency Medicine | Admitting: Emergency Medicine

## 2016-07-06 DIAGNOSIS — I132 Hypertensive heart and chronic kidney disease with heart failure and with stage 5 chronic kidney disease, or end stage renal disease: Secondary | ICD-10-CM | POA: Diagnosis not present

## 2016-07-06 DIAGNOSIS — I2510 Atherosclerotic heart disease of native coronary artery without angina pectoris: Secondary | ICD-10-CM | POA: Insufficient documentation

## 2016-07-06 DIAGNOSIS — Z85038 Personal history of other malignant neoplasm of large intestine: Secondary | ICD-10-CM | POA: Insufficient documentation

## 2016-07-06 DIAGNOSIS — E1122 Type 2 diabetes mellitus with diabetic chronic kidney disease: Secondary | ICD-10-CM | POA: Insufficient documentation

## 2016-07-06 DIAGNOSIS — Z794 Long term (current) use of insulin: Secondary | ICD-10-CM | POA: Diagnosis not present

## 2016-07-06 DIAGNOSIS — I5043 Acute on chronic combined systolic (congestive) and diastolic (congestive) heart failure: Secondary | ICD-10-CM | POA: Insufficient documentation

## 2016-07-06 DIAGNOSIS — N186 End stage renal disease: Secondary | ICD-10-CM | POA: Diagnosis not present

## 2016-07-06 DIAGNOSIS — R0602 Shortness of breath: Secondary | ICD-10-CM | POA: Insufficient documentation

## 2016-07-06 DIAGNOSIS — Z8546 Personal history of malignant neoplasm of prostate: Secondary | ICD-10-CM | POA: Insufficient documentation

## 2016-07-06 DIAGNOSIS — Z992 Dependence on renal dialysis: Secondary | ICD-10-CM | POA: Diagnosis not present

## 2016-07-06 DIAGNOSIS — Z79899 Other long term (current) drug therapy: Secondary | ICD-10-CM | POA: Insufficient documentation

## 2016-07-06 DIAGNOSIS — Z87891 Personal history of nicotine dependence: Secondary | ICD-10-CM | POA: Diagnosis not present

## 2016-07-06 DIAGNOSIS — Z7982 Long term (current) use of aspirin: Secondary | ICD-10-CM | POA: Insufficient documentation

## 2016-07-06 LAB — CBC
HCT: 28.9 % — ABNORMAL LOW (ref 39.0–52.0)
HEMOGLOBIN: 9.5 g/dL — AB (ref 13.0–17.0)
MCH: 34.3 pg — AB (ref 26.0–34.0)
MCHC: 32.9 g/dL (ref 30.0–36.0)
MCV: 104.3 fL — AB (ref 78.0–100.0)
Platelets: 156 10*3/uL (ref 150–400)
RBC: 2.77 MIL/uL — AB (ref 4.22–5.81)
RDW: 14.2 % (ref 11.5–15.5)
WBC: 6.8 10*3/uL (ref 4.0–10.5)

## 2016-07-06 LAB — HEPATIC FUNCTION PANEL
ALK PHOS: 32 U/L — AB (ref 38–126)
ALT: 16 U/L — AB (ref 17–63)
AST: 20 U/L (ref 15–41)
Albumin: 3.6 g/dL (ref 3.5–5.0)
Bilirubin, Direct: <0.1 mg/dL — ABNORMAL LOW (ref 0.1–0.5)
TOTAL PROTEIN: 7 g/dL (ref 6.5–8.1)
Total Bilirubin: 0.5 mg/dL (ref 0.3–1.2)

## 2016-07-06 LAB — BASIC METABOLIC PANEL
ANION GAP: 15 (ref 5–15)
BUN: 70 mg/dL — ABNORMAL HIGH (ref 6–20)
CHLORIDE: 95 mmol/L — AB (ref 101–111)
CO2: 22 mmol/L (ref 22–32)
Calcium: 10 mg/dL (ref 8.9–10.3)
Creatinine, Ser: 6.96 mg/dL — ABNORMAL HIGH (ref 0.61–1.24)
GFR calc non Af Amer: 6 mL/min — ABNORMAL LOW (ref >60)
GFR, EST AFRICAN AMERICAN: 7 mL/min — AB (ref >60)
Glucose, Bld: 227 mg/dL — ABNORMAL HIGH (ref 65–99)
Potassium: 4.7 mmol/L (ref 3.5–5.1)
Sodium: 132 mmol/L — ABNORMAL LOW (ref 135–145)

## 2016-07-06 LAB — BRAIN NATRIURETIC PEPTIDE: B Natriuretic Peptide: 1768.5 pg/mL — ABNORMAL HIGH (ref 0.0–100.0)

## 2016-07-06 LAB — PROTIME-INR
INR: 1.64
Prothrombin Time: 19.6 seconds — ABNORMAL HIGH (ref 11.4–15.2)

## 2016-07-06 LAB — D-DIMER, QUANTITATIVE (NOT AT ARMC): D DIMER QUANT: 2.56 ug{FEU}/mL — AB (ref 0.00–0.50)

## 2016-07-06 MED ORDER — SODIUM CHLORIDE 0.9 % IV SOLN: INTRAVENOUS | Status: DC

## 2016-07-06 NOTE — ED Provider Notes (Signed)
[facility] Provider Note   CSN: [id number] Arrival date & time: 07/06/16  [id number]     History   Chief Complaint Chief Complaint  Patient presents with  . Shortness of Breath    HPI [name] [name]. is a 82 y.o. male.  Patient is a dialysis patient. Been having exertional shortness of breath only able to go a few feet before he gets short of breath. Patient normally dialyzed Monday Wednesdays and Fridays. Was dialyzed on Wednesday completely and Friday was partial because he developed some pain. Patient also has a history of congestive heart failure has a defibrillator and pacemaker. Patient also with recent establishment of a left thigh AV fistula just in the past week or so. Patient's normally on Coumadin. But it was stopped for the surgery. Patient just restarted it on Wednesday. Patient denies any chest pain. No nausea or vomiting no fevers.      Past Medical History:  Diagnosis Date  . CARDIOMYOPATHY, PRIMARY, DILATED    a. Mixed ICM/NICM - EF 15% by echo 05/2009; St. Jude CRT-D implanted 06/2009. b. Echo 04/2013 - EF 20%, mild LVH.  [name] Chronic atrial fibrillation ([place])    a. Historically difficult rate control. b. s/p CRT-D implantation with anticipation of AV junction ablation but patient then was able to accomplish rate control and ablation not undertaken.; AVN ablation 08/2013 by Dr Lovena Le  . Chronic systolic CHF (congestive heart failure) ([place])   . Colon cancer ([place])   . CORONARY ARTERY DISEASE    a. BMS to LAD 04/2008. b. Cath 06/2009: nonobstructive disease. c. cath 05/16/2015 DES to prox LAD  . Diabetes mellitus   . Dyspnea    with exertion  . ESRD (end stage renal disease) ([place])    M_W_F hemodialysis  . History of blood transfusion    after colon surgery  . Hyperlipidemia   . Hypertension   . Osteoarthritis   . Pneumonia 2014ish  . Presence of permanent cardiac pacemaker   . Prostate cancer [facility])    a. s/p radical prostatectomy.  . Pulmonary HTN     a. PA pressure 44mmHg by echo 05/01/13.  [name] Secondary hyperparathyroidism ([place])   . Skin cancer   . SLEEP APNEA    wears CPAP  . Tubular adenoma of colon   . Valvular heart disease    a. Echo 04/2013: mild AI, mild MR.  . Vertigo     Patient Active Problem List   Diagnosis Date Noted  . ESRD (end stage renal disease) ([place]) 07/01/2016  . Acute respiratory distress 12/18/2015  . Acute on chronic systolic and diastolic heart failure, NYHA class 2 ([place]) 12/18/2015  . Positive D dimer 12/18/2015  . Dyspnea on exertion 05/17/2015  . Elevated troponin 05/07/2015  . Diabetic neuropathy ([place]) 08/22/2014  . Diabetes mellitus with renal complications ([place]) [id number]  . Pain in lower limb 02/15/2014  . Encounter for therapeutic drug monitoring 10/20/2013  . Vertigo 04/30/2013  . Onychomycosis 01/26/2013  . Chronic renal insufficiency, stage IV (severe) ([place]) 09/07/2012  . BIVentricular Defibrillator--St Jude 04/20/2011  . Dilated cardiomyopathy ([place]) 08/07/2009  . Osteoarthritis 04/18/2009  . SKIN CANCER, HX OF 04/18/2009  . Sleep apnea 11/01/2008  . Diabetes type 2, uncontrolled ([place]) 07/13/2006  . Hyperlipidemia 07/13/2006  . Essential hypertension 07/13/2006  . CAD (coronary artery disease) 07/13/2006  . Chronic atrial fibrillation ([place]) 07/13/2006  . Chronic systolic CHF (congestive heart failure) ([place]) 07/13/2006  . COLON CANCER, HX OF 07/13/2006  .  PROSTATE CANCER, HX OF 07/13/2006    Past Surgical History:  Procedure Laterality Date  . ABLATION  08/2013   AVN ablation by Dr Lovena Le  . AV FISTULA PLACEMENT  03/30/2012   Procedure: ARTERIOVENOUS (AV) FISTULA CREATION;  Surgeon: Angelia Mould, MD;  Location: [place];  Service: Vascular;  Laterality: Right;  Creation of BrachioCephalic Fistula Right arm  . AV FISTULA PLACEMENT Left 07/01/2016   Procedure: INSERTION OF ARTERIOVENOUS (AV) GORE-TEX GRAFT Left THIGH;  Surgeon: Elam Dutch, MD;  Location: [place];  Service:  Vascular;  Laterality: Left;  . AV NODE ABLATION N/A 09/12/2013   Procedure: AV NODE ABLATION;  Surgeon: [name], MD;  Location: [facility] CATH LAB;  Service: Cardiovascular;  Laterality: N/A;  . BI-VENTRICULAR IMPLANTABLE CARDIOVERTER DEFIBRILLATOR  (CRT-D)  2010   STJ CRTD implanted by Dr [name]  . CARDIAC CATHETERIZATION N/A 05/09/2015   Procedure: Left Heart Cath and Coronary Angiography;  Surgeon: Sherren Mocha, MD;  Location: [place] CV LAB;  Service: Cardiovascular;  Laterality: N/A;  . CARDIAC CATHETERIZATION N/A 05/16/2015   Procedure: Coronary Stent Intervention;  Surgeon: Sherren Mocha, MD;  Location: [place] CV LAB;  Service: Cardiovascular;  Laterality: N/A;  . COLECTOMY    . EYE SURGERY     left eye prothesis  . FRACTURE SURGERY     collar bone, right knee replacement  . INSERT / REPLACE / REMOVE PACEMAKER    . INSERTION OF DIALYSIS CATHETER N/A 05/20/2016   Procedure: INSERTION OF DIALYSIS CATHETER;  Surgeon: Elam Dutch, MD;  Location: [place];  Service: Vascular;  Laterality: N/A;  . JOINT REPLACEMENT    . LIGATION OF ARTERIOVENOUS  FISTULA Right 05/20/2016   Procedure: LIGATION OF RIGHT ARTERIOVENOUS  FISTULA;  Surgeon: Elam Dutch, MD;  Location: [place];  Service: Vascular;  Laterality: Right;  . PACEMAKER INSERTION  2010  . PENILE PROSTHESIS PLACEMENT    . PROSTATECTOMY    . PTCA     stent placed  . removed prosthetic eye    . TOTAL KNEE ARTHROPLASTY  2008   right  . VASCULAR SURGERY  07/01/2016   INSERTION OF ARTERIOVENOUS (AV) GORE-TEX GRAFT THIGH (Left) as a surgical intervention        Home Medications    Prior to Admission medications   Medication Sig Start Date End Date Taking? Authorizing Provider  acetaminophen (TYLENOL) 500 MG tablet Take 1,000 mg by mouth every 4 (four) hours as needed for mild pain.     Historical Provider, MD  aspirin EC 81 MG tablet Take 1 tablet (81 mg total) by mouth daily. 11/09/15   Liliane Shi, PA-C  calcium  carbonate (TUMS EX) 750 MG chewable tablet Chew 1 tablet by mouth 3 (three) times daily with meals.    Historical Provider, MD  digoxin (DIGOX) 0.125 MG tablet Take 125 mcg by mouth every other day 05/20/16   Alvia Grove, PA-C  docusate sodium (COLACE) 100 MG capsule Take 300 mg by mouth daily.    Historical Provider, MD  HUMALOG MIX 75/25 (75-25) 100 UNIT/ML SUSP injection Inject subcutaneously 26  units two times daily with  meals 11/27/15   [name], MD  hydrALAZINE (APRESOLINE) 25 MG tablet Take 25 mg by mouth See admin instructions. Take 25 mg by mouth twice daily on Sundays,tuesdays,Thursdays, and Saturdays.  Take 25 mg by mouth in the evening on Mondays,Wed,and Fridays    Historical Provider, MD  isosorbide mononitrate (IMDUR) 30  MG 24 hr tablet Take 1 tablet by mouth  daily 05/20/16   Alvia Grove, PA-C  metoprolol succinate (TOPROL-XL) 100 MG 24 hr tablet TAKE 50 MG BY MOUTH  TWICE A DAY, WITH OR  IMMEDIATELY FOLLOWING MEALS 05/20/16   Alvia Grove, PA-C  multivitamin (RENA-VIT) TABS tablet Take 1 tablet by mouth daily.    Historical Provider, MD  nitroGLYCERIN (NITROSTAT) 0.4 MG SL tablet Place 0.4 mg under the tongue every 5 (five) minutes as needed for chest pain.    Historical Provider, MD  oxyCODONE-acetaminophen (PERCOCET/ROXICET) 5-325 MG tablet Take 1 tablet by mouth every 6 (six) hours as needed. 07/01/16   Ulyses Amor, PA-C  warfarin (COUMADIN) 5 MG tablet Take 0.5-1 tablets (2.5-5 mg total) by mouth See admin instructions. Take 5 mg daily except 2.5 mg on Mondays OR as directed by anticoagulation clinic. 07/02/16   Alvia Grove, PA-C    Family History Family History  Problem Relation Age of Onset  . Heart disease Father   . Throat cancer Father   . Throat cancer Mother   . Heart disease Mother   . Hypertension Mother     Social History Social History  Substance Use Topics  . Smoking status: Former Smoker    Types: Cigarettes    Quit date:  09/22/1966  . Smokeless tobacco: Never Used  . Alcohol use 0.0 - 0.6 oz/week     Comment: an occasional beer     Allergies   Ace inhibitors and Prednisone   Review of Systems Review of Systems  Constitutional: Negative for fever.  HENT: Negative for congestion.   Eyes: Negative for redness.  Respiratory: Positive for shortness of breath.   Cardiovascular: Negative for chest pain.  Gastrointestinal: Negative for abdominal pain, nausea and vomiting.  Genitourinary: Negative for dysuria.  Musculoskeletal: Negative for back pain.  Skin: Negative for rash.  Neurological: Negative for headaches.  Hematological: Bruises/bleeds easily.  Psychiatric/Behavioral: Negative for confusion.     Physical Exam Updated Vital Signs BP (!) 155/44   Pulse 74   Temp 98.3 F (36.8 C) (Oral)   Resp 24   Wt 82.5 kg   SpO2 93%   BMI 25.37 kg/m   Physical Exam  Constitutional: He is oriented to person, place, and time. He appears well-developed and well-nourished. No distress.  HENT:  Head: Normocephalic and atraumatic.  Mouth/Throat: Oropharynx is clear and moist.  Eyes: EOM are normal. Pupils are equal, round, and reactive to light.  Neck: Normal range of motion. Neck supple.  Cardiovascular: Normal rate and regular rhythm.   Pulmonary/Chest: Effort normal and breath sounds normal.  Right chest temporary external dialysis catheters.  Abdominal: Soft. Bowel sounds are normal. There is no tenderness.  Genitourinary: Rectum normal and penis normal.  Musculoskeletal: Normal range of motion. He exhibits edema.  Left thigh AV fistula surgical wounds healing well no signs of infection.  Very slight edema to the ankles trace left greater than right.  Neurological: He is alert and oriented to person, place, and time. No cranial nerve deficit. He exhibits normal muscle tone. Coordination normal.  Skin: No rash noted.  Nursing note and vitals reviewed.    ED Treatments / Results  Labs (all  labs ordered are listed, but only abnormal results are displayed) Labs Reviewed  BASIC METABOLIC PANEL - Abnormal; Notable for the following:       Result Value   Sodium 132 (*)    Chloride 95 (*)  Glucose, Bld 227 (*)    BUN 70 (*)    Creatinine, Ser 6.96 (*)    GFR calc non Af Amer 6 (*)    GFR calc Af Amer 7 (*)    All other components within normal limits  CBC - Abnormal; Notable for the following:    RBC 2.77 (*)    Hemoglobin 9.5 (*)    HCT 28.9 (*)    MCV 104.3 (*)    MCH 34.3 (*)    All other components within normal limits  HEPATIC FUNCTION PANEL - Abnormal; Notable for the following:    ALT 16 (*)    Alkaline Phosphatase 32 (*)    Bilirubin, Direct <0.1 (*)    All other components within normal limits  BRAIN NATRIURETIC PEPTIDE - Abnormal; Notable for the following:    B Natriuretic Peptide 1,768.5 (*)    All other components within normal limits  PROTIME-INR - Abnormal; Notable for the following:    Prothrombin Time 19.6 (*)    All other components within normal limits  D-DIMER, QUANTITATIVE (NOT AT [facility]) - Abnormal; Notable for the following:    D-Dimer, Quant 2.56 (*)    All other components within normal limits    EKG  EKG Interpretation  Date/Time:  Sunday July 06 2016 10:18:43 EDT Ventricular Rate:  75 PR Interval:    QRS Duration: 223 QT Interval:  494 QTC Calculation: 552 R Axis:   -84 Text Interpretation:  Accelerated junctional rhythm Nonspecific IVCD with LAD LVH with secondary repolarization abnormality VENTRICULAR PACED RHYTHM Lateral leads are also involved Confirmed by Rogene Houston  MD, Khori Underberg ([id number]) on 07/06/2016 10:32:30 AM       Radiology Dg Chest 2 View  Result Date: 07/06/2016 CLINICAL DATA:  Shortness of breath. EXAM: CHEST  2 VIEW COMPARISON:  May 20, 2016 FINDINGS: The double lumen dialysis catheter is in stable position. Minimal atelectasis in the lateral right lung base. Mild cardiomegaly i an n stable AICD device. No overt  edema. Minimal opacity in the medial right lung base. IMPRESSION: 1. Minimal opacity in the medial right lung base. Recommend follow-up to resolution. Electronically Signed   By: Dorise Bullion III M.D   On: 07/06/2016 10:49   US Venous Img Lower Bilateral  Result Date: 07/06/2016 CLINICAL DATA:  82 year old male with a history of left upper thigh dialysis graft. Worsening shortness of breath. EXAM: BILATERAL LOWER EXTREMITY VENOUS DOPPLER ULTRASOUND TECHNIQUE: Gray-scale sonography with graded compression, as well as color Doppler and duplex ultrasound were performed to evaluate the lower extremity deep venous systems from the level of the common femoral vein and including the common femoral, femoral, profunda femoral, popliteal and calf veins including the posterior tibial, peroneal and gastrocnemius veins when visible. The superficial great saphenous vein was also interrogated. Spectral Doppler was utilized to evaluate flow at rest and with distal augmentation maneuvers in the common femoral, femoral and popliteal veins. COMPARISON:  None. FINDINGS: RIGHT LOWER EXTREMITY Common Femoral Vein: No evidence of thrombus. Normal compressibility, respiratory phasicity and response to augmentation. Saphenofemoral Junction: No evidence of thrombus. Normal compressibility and flow on color Doppler imaging. Profunda Femoral Vein: No evidence of thrombus. Normal compressibility and flow on color Doppler imaging. Femoral Vein: No evidence of thrombus. Normal compressibility, respiratory phasicity and response to augmentation. Popliteal Vein: No evidence of thrombus. Normal compressibility, respiratory phasicity and response to augmentation. Calf Veins: No evidence of thrombus. Normal compressibility and flow on color Doppler imaging. Superficial Great Saphenous Vein: No  evidence of thrombus. Normal compressibility and flow on color Doppler imaging. Other Findings:  None. LEFT LOWER EXTREMITY Common Femoral Vein:  Waveform compatible with the presence of arterial venous dialysis circuit. Saphenofemoral Junction: No evidence of thrombus. Normal compressibility and flow on color Doppler imaging. Profunda Femoral Vein: No evidence of thrombus. Normal compressibility and flow on color Doppler imaging. Femoral Vein: No evidence of thrombus. Normal compressibility, respiratory phasicity and response to augmentation. Popliteal Vein: No evidence of thrombus. Normal compressibility, respiratory phasicity and response to augmentation. Calf Veins: No evidence of thrombus. Normal compressibility and flow on color Doppler imaging. Superficial Great Saphenous Vein: No evidence of thrombus. Normal compressibility and flow on color Doppler imaging. Other Findings:  None. IMPRESSION: Sonographic survey of the bilateral lower extremities negative for DVT. Waveform of the left common femoral vein compatible with presence of arterial venous hemodialysis circuit. Signed, Dulcy Fanny. Earleen Newport, DO [facility] Electronically Signed   By: Corrie Mckusick D.O.   On: 07/06/2016 13:21    Procedures Procedures (including critical care time)  Medications Ordered in ED Medications  0.9 %  sodium chloride infusion (not administered)     Initial Impression / Assessment and Plan / ED Course  I have reviewed the triage vital signs and the nursing notes.  Pertinent labs & imaging results that were available during my care of the patient were reviewed by me and considered in my medical decision making (see chart for details).  Clinical Course    Patient with a history of congestive heart failure very low ejection fraction. Patient has an pacemaker defibrillator. Patient without any chest pain. Patient came in for shortness of breath. Patient's dialysis patient. Patient normally dialyzed Monday Wednesdays and Fridays. Patient only underwent half of his dialysis on Friday he was dialyzed  completely on Wednesday. Patient with complaint of exertional shortness of breath.  Patient with recent the AV fistula left thigh area. Still using temporary fistula in the right chest area. Patient was off his normal Coumadin started it back on Wednesday.   Workup here without the evidence of any DVTs in the legs. Not able to do CT angiogram of the chest because he hasn't been on dialysis that long. Started it in May.  D-dimer was elevated but that was to be expected. Patient was ambulated sats never went below 95% on room air. Chest x-ray negative for any significant pulmonary edema. BNP was elevated.  EKG without any particular changes shows a ventricular paced rhythm.   Basic labs without any significant abnormalities other than the elevated BUN/creatinine which would be expected from an dialysis patient. Potassium was not elevated.  Based on patient maintaining his sats with ambulation and no significant shortness of breath patient to be discharged home. He'll have dialysis again tomorrow. He has follow-up with Coumadin clinic.  Final Clinical Impressions(s) / ED Diagnoses   Final diagnoses:  SOB (shortness of breath)    New Prescriptions New Prescriptions   No medications on file     Fredia Sorrow, MD 07/06/16 1426

## 2016-07-06 NOTE — Discharge Summary (Signed)
[facility] Discharge Summary  Christophe Gobel 12-09-1930 82 y.o. male  [id number]  Admission Date: 07/01/2016  Discharge Date: 07/02/2016  Physician: [name], MD  Admission Diagnosis: End Stage Renal Disease N18.6  HPI:   This is a 82 y.o. male who presented for evaluation after recent ligation of his right upper arm AV fistula. The clumsiness symptoms in his hand have improved. He does still have some numbness and tingling. Hopefully this will improve with time. He is currently dialyzing with a right-sided catheter. He has an AICD on the left side. He also has some tightness in his cast after walking long distances consistent with some element of mild claudication. He is on Coumadin.   Hospital Course:  The patient was admitted to the hospital and taken to the operating room on 07/01/2016 and underwent: left thigh AV graft    The patient tolerated the procedure well and was transported to the PACU in stable condition.   Nephrology was consulted for dialysis. The patient was doing well on POD 1. He had a palpable thrill in his left thigh graft and he had no steal symptoms. He was restarted on coumadin. INR f/u was set up. He was discharged home after HD on POD 1.     CBC    Component Value Date/Time   WBC 8.7 07/02/2016 1335   RBC 2.82 (L) 07/02/2016 1335   HGB 9.5 (L) 07/02/2016 1335   HCT 29.1 (L) 07/02/2016 1335   PLT 127 (L) 07/02/2016 1335   MCV 103.2 (H) 07/02/2016 1335   MCH 33.7 07/02/2016 1335   MCHC 32.6 07/02/2016 1335   RDW 15.3 07/02/2016 1335   LYMPHSABS 1.4 05/07/2015 0940   MONOABS 0.5 05/07/2015 0940   EOSABS 0.1 05/07/2015 0940   BASOSABS 0.0 05/07/2015 0940    BMET    Component Value Date/Time   NA 137 07/02/2016 1335   K 4.1 07/02/2016 1335   CL 97 (L) 07/02/2016 1335   CO2 25 07/02/2016 1335   GLUCOSE 151 (H) 07/02/2016 1335   BUN 56 (H) 07/02/2016 1335   CREATININE 6.67 (H) 07/02/2016 1335   CALCIUM 8.9  07/02/2016 1335   CALCIUM 8.9 04/14/2008 0500   GFRNONAA 7 (L) 07/02/2016 1335   GFRAA 8 (L) 07/02/2016 1335     Discharge Instructions:   The patient is discharged to home with extensive instructions on wound care and progressive ambulation.  They are instructed not to drive or perform any heavy lifting until returning to see the physician in his office.  Discharge Instructions    Call MD for:  redness, tenderness, or signs of infection (pain, swelling, bleeding, redness, odor or green/yellow discharge around incision site)    Complete by:  As directed    Call MD for:  severe or increased pain, loss or decreased feeling  in affected limb(s)    Complete by:  As directed    Call MD for:  temperature >100.5    Complete by:  As directed    Discharge instructions    Complete by:  As directed    You may wash your arm in 24 hours after discharge home   Increase activity slowly    Complete by:  As directed    Walk with assistance use walker or cane as needed   Resume previous diet    Complete by:  As directed       Discharge Diagnosis:  End Stage Renal Disease N18.6  Secondary Diagnosis: Patient  Active Problem List   Diagnosis Date Noted  . ESRD (end stage renal disease) ([place]) 07/01/2016  . Acute respiratory distress 12/18/2015  . Acute on chronic systolic and diastolic heart failure, NYHA class 2 ([place]) 12/18/2015  . Positive D dimer 12/18/2015  . Dyspnea on exertion 05/17/2015  . Elevated troponin 05/07/2015  . Diabetic neuropathy ([place]) 08/22/2014  . Diabetes mellitus with renal complications ([place]) [id number]  . Pain in lower limb 02/15/2014  . Encounter for therapeutic drug monitoring 10/20/2013  . Vertigo 04/30/2013  . Onychomycosis 01/26/2013  . Chronic renal insufficiency, stage IV (severe) ([place]) 09/07/2012  . BIVentricular Defibrillator--St Jude 04/20/2011  . Dilated cardiomyopathy ([place]) 08/07/2009  . Osteoarthritis 04/18/2009  . SKIN CANCER, HX OF 04/18/2009  .  Sleep apnea 11/01/2008  . Diabetes type 2, uncontrolled ([place]) 07/13/2006  . Hyperlipidemia 07/13/2006  . Essential hypertension 07/13/2006  . CAD (coronary artery disease) 07/13/2006  . Chronic atrial fibrillation ([place]) 07/13/2006  . Chronic systolic CHF (congestive heart failure) ([place]) 07/13/2006  . COLON CANCER, HX OF 07/13/2006  . PROSTATE CANCER, HX OF 07/13/2006   Past Medical History:  Diagnosis Date  . CARDIOMYOPATHY, PRIMARY, DILATED    a. Mixed ICM/NICM - EF 15% by echo 05/2009; St. Jude CRT-D implanted 06/2009. b. Echo 04/2013 - EF 20%, mild LVH.  [name] Chronic atrial fibrillation ([place])    a. Historically difficult rate control. b. s/p CRT-D implantation with anticipation of AV junction ablation but patient then was able to accomplish rate control and ablation not undertaken.; AVN ablation 08/2013 by Dr Lovena Le  . Chronic systolic CHF (congestive heart failure) ([place])   . Colon cancer ([place])   . CORONARY ARTERY DISEASE    a. BMS to LAD 04/2008. b. Cath 06/2009: nonobstructive disease. c. cath 05/16/2015 DES to prox LAD  . Diabetes mellitus   . Dyspnea    with exertion  . ESRD (end stage renal disease) ([place])    M_W_F hemodialysis  . History of blood transfusion    after colon surgery  . Hyperlipidemia   . Hypertension   . Osteoarthritis   . Pneumonia 2014ish  . Presence of permanent cardiac pacemaker   . Prostate cancer [facility])    a. s/p radical prostatectomy.  . Pulmonary HTN    a. PA pressure 15mmHg by echo 05/01/13.  [name] Secondary hyperparathyroidism ([place])   . Skin cancer   . SLEEP APNEA    wears CPAP  . Tubular adenoma of colon   . Valvular heart disease    a. Echo 04/2013: mild AI, mild MR.  . Vertigo        Medication List    TAKE these medications   acetaminophen 500 MG tablet Commonly known as:  TYLENOL Take 1,000 mg by mouth every 4 (four) hours as needed for mild pain.   aspirin EC 81 MG tablet Take 1 tablet (81 mg total) by mouth daily.   calcium carbonate  750 MG chewable tablet Commonly known as:  TUMS EX Chew 1 tablet by mouth 3 (three) times daily with meals.   digoxin 0.125 MG tablet Commonly known as:  DIGOX Take 125 mcg by mouth every other day   docusate sodium 100 MG capsule Commonly known as:  COLACE Take 300 mg by mouth daily.   HUMALOG MIX 75/25 (75-25) 100 UNIT/ML Susp injection Generic drug:  insulin lispro protamine-lispro Inject subcutaneously 26  units two times daily with  meals   hydrALAZINE 25 MG tablet Commonly known as:  APRESOLINE Take  25 mg by mouth See admin instructions. Take 25 mg by mouth twice daily on Sundays,tuesdays,Thursdays, and Saturdays.  Take 25 mg by mouth in the evening on Mondays,Wed,and Fridays   isosorbide mononitrate 30 MG 24 hr tablet Commonly known as:  IMDUR Take 1 tablet by mouth  daily   metoprolol succinate 100 MG 24 hr tablet Commonly known as:  TOPROL-XL TAKE 50 MG BY MOUTH  TWICE A DAY, WITH OR  IMMEDIATELY FOLLOWING MEALS   multivitamin Tabs tablet Take 1 tablet by mouth daily.   nitroGLYCERIN 0.4 MG SL tablet Commonly known as:  NITROSTAT Place 0.4 mg under the tongue every 5 (five) minutes as needed for chest pain.   oxyCODONE-acetaminophen 5-325 MG tablet Commonly known as:  PERCOCET/ROXICET Take 1 tablet by mouth every 6 (six) hours as needed.   warfarin 5 MG tablet Commonly known as:  COUMADIN Take 0.5-1 tablets (2.5-5 mg total) by mouth See admin instructions. Take 5 mg daily except 2.5 mg on Mondays OR as directed by anticoagulation clinic. What changed:  how much to take  how to take this  when to take this       Percocet #6 No Refill  Disposition: Home  Patient's condition: is Good  Follow up: 1. Dr. Oneida Alar in 4 weeks   Virgina Jock, PA-C [facility] [phone] 07/06/2016  9:59 AM

## 2016-07-06 NOTE — ED Triage Notes (Addendum)
Pt had his dialysis graft placed in L upper thigh on Tuesday 9/10 at [facility]. Pt has had progressively worsening SOB since he was discharged especially with exertion.

## 2016-07-06 NOTE — ED Notes (Signed)
MD at bedside. 

## 2016-07-06 NOTE — ED Notes (Addendum)
Ambulated in hall on r/a, HR 75-80, RR 18-24, SpO2 95-97% for duration. VS updated after ambulation.

## 2016-07-06 NOTE — ED Notes (Signed)
Patient transported to X-ray 

## 2016-07-06 NOTE — Discharge Instructions (Signed)
Follow-up as scheduled. Return for any new or worse symptoms. Today's workup without any significant findings. Oxygen saturations remained very normal. Continue your dialysis schedule as planned for tomorrow. Follow-up with vascular surgery as scheduled. Your Coumadin level was on the low side. This does not have to be followed closely to make sure he gets up to therapeutic.

## 2016-07-07 ENCOUNTER — Ambulatory Visit (INDEPENDENT_AMBULATORY_CARE_PROVIDER_SITE_OTHER): Payer: Medicare Other | Admitting: General Practice

## 2016-07-07 DIAGNOSIS — I482 Chronic atrial fibrillation, unspecified: Secondary | ICD-10-CM

## 2016-07-07 DIAGNOSIS — Z5181 Encounter for therapeutic drug level monitoring: Secondary | ICD-10-CM

## 2016-07-07 LAB — POCT INR: INR: 2.2

## 2016-07-10 ENCOUNTER — Encounter (HOSPITAL_COMMUNITY): Payer: Medicare Other

## 2016-07-10 ENCOUNTER — Ambulatory Visit: Payer: Medicare Other | Admitting: Vascular Surgery

## 2016-07-11 ENCOUNTER — Ambulatory Visit: Payer: Medicare Other

## 2016-07-13 ENCOUNTER — Encounter (HOSPITAL_COMMUNITY): Payer: Self-pay

## 2016-07-13 ENCOUNTER — Inpatient Hospital Stay (HOSPITAL_COMMUNITY)
Admission: EM | Admit: 2016-07-13 | Discharge: 2016-07-15 | DRG: 640 | Disposition: A | Discharge status: Home / Self Care | Payer: Medicare Other | Attending: Internal Medicine | Admitting: Internal Medicine

## 2016-07-13 ENCOUNTER — Emergency Department (HOSPITAL_COMMUNITY): Payer: Medicare Other

## 2016-07-13 DIAGNOSIS — Z79899 Other long term (current) drug therapy: Secondary | ICD-10-CM

## 2016-07-13 DIAGNOSIS — G4730 Sleep apnea, unspecified: Secondary | ICD-10-CM | POA: Diagnosis present

## 2016-07-13 DIAGNOSIS — E785 Hyperlipidemia, unspecified: Secondary | ICD-10-CM | POA: Diagnosis present

## 2016-07-13 DIAGNOSIS — Z7982 Long term (current) use of aspirin: Secondary | ICD-10-CM

## 2016-07-13 DIAGNOSIS — E8779 Other fluid overload: Secondary | ICD-10-CM | POA: Diagnosis not present

## 2016-07-13 DIAGNOSIS — I482 Chronic atrial fibrillation, unspecified: Secondary | ICD-10-CM | POA: Diagnosis present

## 2016-07-13 DIAGNOSIS — R778 Other specified abnormalities of plasma proteins: Secondary | ICD-10-CM | POA: Diagnosis present

## 2016-07-13 DIAGNOSIS — Z4502 Encounter for adjustment and management of automatic implantable cardiac defibrillator: Secondary | ICD-10-CM

## 2016-07-13 DIAGNOSIS — Z808 Family history of malignant neoplasm of other organs or systems: Secondary | ICD-10-CM

## 2016-07-13 DIAGNOSIS — D631 Anemia in chronic kidney disease: Secondary | ICD-10-CM | POA: Diagnosis present

## 2016-07-13 DIAGNOSIS — I5022 Chronic systolic (congestive) heart failure: Secondary | ICD-10-CM | POA: Diagnosis present

## 2016-07-13 DIAGNOSIS — I509 Heart failure, unspecified: Secondary | ICD-10-CM

## 2016-07-13 DIAGNOSIS — Z87891 Personal history of nicotine dependence: Secondary | ICD-10-CM

## 2016-07-13 DIAGNOSIS — Z8546 Personal history of malignant neoplasm of prostate: Secondary | ICD-10-CM

## 2016-07-13 DIAGNOSIS — M1990 Unspecified osteoarthritis, unspecified site: Secondary | ICD-10-CM | POA: Diagnosis present

## 2016-07-13 DIAGNOSIS — Z950 Presence of cardiac pacemaker: Secondary | ICD-10-CM

## 2016-07-13 DIAGNOSIS — Z85828 Personal history of other malignant neoplasm of skin: Secondary | ICD-10-CM

## 2016-07-13 DIAGNOSIS — R0609 Other forms of dyspnea: Secondary | ICD-10-CM | POA: Diagnosis not present

## 2016-07-13 DIAGNOSIS — Z992 Dependence on renal dialysis: Secondary | ICD-10-CM

## 2016-07-13 DIAGNOSIS — E1122 Type 2 diabetes mellitus with diabetic chronic kidney disease: Secondary | ICD-10-CM | POA: Diagnosis present

## 2016-07-13 DIAGNOSIS — R0600 Dyspnea, unspecified: Secondary | ICD-10-CM | POA: Diagnosis not present

## 2016-07-13 DIAGNOSIS — IMO0002 Reserved for concepts with insufficient information to code with codable children: Secondary | ICD-10-CM | POA: Diagnosis present

## 2016-07-13 DIAGNOSIS — I132 Hypertensive heart and chronic kidney disease with heart failure and with stage 5 chronic kidney disease, or end stage renal disease: Secondary | ICD-10-CM | POA: Diagnosis present

## 2016-07-13 DIAGNOSIS — I420 Dilated cardiomyopathy: Secondary | ICD-10-CM | POA: Diagnosis present

## 2016-07-13 DIAGNOSIS — N2581 Secondary hyperparathyroidism of renal origin: Secondary | ICD-10-CM | POA: Diagnosis present

## 2016-07-13 DIAGNOSIS — Z9079 Acquired absence of other genital organ(s): Secondary | ICD-10-CM

## 2016-07-13 DIAGNOSIS — E1165 Type 2 diabetes mellitus with hyperglycemia: Secondary | ICD-10-CM | POA: Diagnosis present

## 2016-07-13 DIAGNOSIS — Z7901 Long term (current) use of anticoagulants: Secondary | ICD-10-CM

## 2016-07-13 DIAGNOSIS — Z96651 Presence of right artificial knee joint: Secondary | ICD-10-CM | POA: Diagnosis present

## 2016-07-13 DIAGNOSIS — M898X9 Other specified disorders of bone, unspecified site: Secondary | ICD-10-CM | POA: Diagnosis present

## 2016-07-13 DIAGNOSIS — Z794 Long term (current) use of insulin: Secondary | ICD-10-CM

## 2016-07-13 DIAGNOSIS — Z955 Presence of coronary angioplasty implant and graft: Secondary | ICD-10-CM

## 2016-07-13 DIAGNOSIS — Z970 Presence of artificial eye: Secondary | ICD-10-CM

## 2016-07-13 DIAGNOSIS — Z66 Do not resuscitate: Secondary | ICD-10-CM | POA: Diagnosis present

## 2016-07-13 DIAGNOSIS — Z8249 Family history of ischemic heart disease and other diseases of the circulatory system: Secondary | ICD-10-CM

## 2016-07-13 DIAGNOSIS — I5023 Acute on chronic systolic (congestive) heart failure: Secondary | ICD-10-CM | POA: Diagnosis present

## 2016-07-13 DIAGNOSIS — N186 End stage renal disease: Secondary | ICD-10-CM | POA: Diagnosis present

## 2016-07-13 DIAGNOSIS — I2510 Atherosclerotic heart disease of native coronary artery without angina pectoris: Secondary | ICD-10-CM | POA: Diagnosis present

## 2016-07-13 LAB — COMPREHENSIVE METABOLIC PANEL
ALBUMIN: 3.3 g/dL — AB (ref 3.5–5.0)
ALT: 20 U/L (ref 17–63)
ANION GAP: 18 — AB (ref 5–15)
AST: 23 U/L (ref 15–41)
Alkaline Phosphatase: 38 U/L (ref 38–126)
BILIRUBIN TOTAL: 0.7 mg/dL (ref 0.3–1.2)
BUN: 60 mg/dL — ABNORMAL HIGH (ref 6–20)
CALCIUM: 9.2 mg/dL (ref 8.9–10.3)
CO2: 26 mmol/L (ref 22–32)
Chloride: 95 mmol/L — ABNORMAL LOW (ref 101–111)
Creatinine, Ser: 6.75 mg/dL — ABNORMAL HIGH (ref 0.61–1.24)
GFR calc non Af Amer: 7 mL/min — ABNORMAL LOW (ref >60)
GFR, EST AFRICAN AMERICAN: 8 mL/min — AB (ref >60)
GLUCOSE: 237 mg/dL — AB (ref 65–99)
POTASSIUM: 4.3 mmol/L (ref 3.5–5.1)
SODIUM: 139 mmol/L (ref 135–145)
TOTAL PROTEIN: 6.2 g/dL — AB (ref 6.5–8.1)

## 2016-07-13 LAB — CBC WITH DIFFERENTIAL/PLATELET
BASOS PCT: 0 %
Basophils Absolute: 0 10*3/uL (ref 0.0–0.1)
EOS ABS: 0.2 10*3/uL (ref 0.0–0.7)
Eosinophils Relative: 3 %
HEMATOCRIT: 28.6 % — AB (ref 39.0–52.0)
Hemoglobin: 9.2 g/dL — ABNORMAL LOW (ref 13.0–17.0)
LYMPHS ABS: 1.2 10*3/uL (ref 0.7–4.0)
Lymphocytes Relative: 16 %
MCH: 33.3 pg (ref 26.0–34.0)
MCHC: 32.2 g/dL (ref 30.0–36.0)
MCV: 103.6 fL — ABNORMAL HIGH (ref 78.0–100.0)
MONO ABS: 0.5 10*3/uL (ref 0.1–1.0)
MONOS PCT: 7 %
Neutro Abs: 5.6 10*3/uL (ref 1.7–7.7)
Neutrophils Relative %: 74 %
Platelets: 134 10*3/uL — ABNORMAL LOW (ref 150–400)
RBC: 2.76 MIL/uL — ABNORMAL LOW (ref 4.22–5.81)
RDW: 15.5 % (ref 11.5–15.5)
WBC: 7.5 10*3/uL (ref 4.0–10.5)

## 2016-07-13 LAB — BRAIN NATRIURETIC PEPTIDE: B Natriuretic Peptide: 1725.3 pg/mL — ABNORMAL HIGH (ref 0.0–100.0)

## 2016-07-13 LAB — I-STAT TROPONIN, ED: Troponin i, poc: 0.08 ng/mL (ref 0.00–0.08)

## 2016-07-13 MED ORDER — SODIUM CHLORIDE 0.9 % IV SOLN: 250.0000 mL | INTRAVENOUS | Status: DC | PRN

## 2016-07-13 MED ORDER — HYDRALAZINE HCL 25 MG PO TABS: 25.0000 mg | ORAL_TABLET | ORAL | Status: DC

## 2016-07-13 MED ORDER — HYDROCOD POLST-CPM POLST ER 10-8 MG/5ML PO SUER
5.0000 mL | Freq: Once | ORAL | Status: AC
  Administered 2016-07-14: 5 mL via ORAL
  Filled 2016-07-13: qty 5

## 2016-07-13 MED ORDER — SODIUM CHLORIDE 0.9% FLUSH: 3.0000 mL | INTRAVENOUS | Status: DC | PRN

## 2016-07-13 MED ORDER — SODIUM CHLORIDE 0.9% FLUSH
3.0000 mL | Freq: Two times a day (BID) | INTRAVENOUS | Status: DC
  Administered 2016-07-14 – 2016-07-15 (×4): 3 mL via INTRAVENOUS

## 2016-07-13 MED ORDER — SODIUM CHLORIDE 0.9% FLUSH
3.0000 mL | Freq: Two times a day (BID) | INTRAVENOUS | Status: DC
  Administered 2016-07-14 – 2016-07-15 (×3): 3 mL via INTRAVENOUS

## 2016-07-13 MED ORDER — ACETAMINOPHEN 325 MG PO TABS
650.0000 mg | ORAL_TABLET | ORAL | Status: DC | PRN
  Administered 2016-07-14 – 2016-07-15 (×2): 650 mg via ORAL
  Filled 2016-07-13 (×2): qty 2

## 2016-07-13 MED ORDER — CALCIUM CARBONATE ANTACID 500 MG PO CHEW: 750.0000 mg | CHEWABLE_TABLET | Freq: Three times a day (TID) | ORAL | Status: DC

## 2016-07-13 NOTE — ED Triage Notes (Signed)
Patient presents for evaluation of persistently increased shortness of breath post graft placement in LLE. ESRD; MWF; Denies missing appointments but states he has rarely been able to tolerate entire treatments. Explains that treatments have been interrupted by muscle cramps and sometimes prevented by weigh in values. Bruit noted over new graft site. Patient became notably short of breath while attempting to remove pants in bed. Explains that this is unusual for him and that prior to graft placement he could walk and perform daily activities with little difficulty.

## 2016-07-13 NOTE — ED Provider Notes (Signed)
[facility] DEPT Provider Note   CSN: [id number] Arrival date & time: 07/13/16  [id number]     History   Chief Complaint No chief complaint on file.   HPI [name] [name]. is a 82 y.o. male.  HPI [name] [name]. is a 82 y.o. male with hx of CHF, Afib, ESRD on dialysis, Presents to emergency department complaining of shortness of breath. Patient states he has had gradual worsening in shortness of breath in the last several days. He reports he is only able to walk 4 steps without getting short of breath. He states he is last 3 dialysis sessions, last on Friday, he had to stop early because he had leg cramping. He reports nonproductive cough. He reports chest tightness. He reports mild swelling in his legs. He denies any chest pain. Denies any back pain or abdominal pain. No fever or chills. No URI symptoms. Was evaluated 5 days ago. At that time was able to maintain oxygen sat and was dc home.   Past Medical History:  Diagnosis Date  . CARDIOMYOPATHY, PRIMARY, DILATED    a. Mixed ICM/NICM - EF 15% by echo 05/2009; St. Jude CRT-D implanted 06/2009. b. Echo 04/2013 - EF 20%, mild LVH.  [name] Chronic atrial fibrillation ([place])    a. Historically difficult rate control. b. s/p CRT-D implantation with anticipation of AV junction ablation but patient then was able to accomplish rate control and ablation not undertaken.; AVN ablation 08/2013 by Dr Lovena Le  . Chronic systolic CHF (congestive heart failure) ([place])   . Colon cancer ([place])   . CORONARY ARTERY DISEASE    a. BMS to LAD 04/2008. b. Cath 06/2009: nonobstructive disease. c. cath 05/16/2015 DES to prox LAD  . Diabetes mellitus   . Dyspnea    with exertion  . ESRD (end stage renal disease) ([place])    M_W_F hemodialysis  . History of blood transfusion    after colon surgery  . Hyperlipidemia   . Hypertension   . Osteoarthritis   . Pneumonia 2014ish  . Presence of permanent cardiac pacemaker   . Prostate cancer [facility])    a. s/p radical  prostatectomy.  . Pulmonary HTN    a. PA pressure 107mmHg by echo 05/01/13.  [name] Secondary hyperparathyroidism ([place])   . Skin cancer   . SLEEP APNEA    wears CPAP  . Tubular adenoma of colon   . Valvular heart disease    a. Echo 04/2013: mild AI, mild MR.  . Vertigo     Patient Active Problem List   Diagnosis Date Noted  . ESRD (end stage renal disease) ([place]) 07/01/2016  . Acute respiratory distress 12/18/2015  . Acute on chronic systolic and diastolic heart failure, NYHA class 2 ([place]) 12/18/2015  . Positive D dimer 12/18/2015  . Dyspnea on exertion 05/17/2015  . Elevated troponin 05/07/2015  . Diabetic neuropathy ([place]) 08/22/2014  . Diabetes mellitus with renal complications ([place]) [id number]  . Pain in lower limb 02/15/2014  . Encounter for therapeutic drug monitoring 10/20/2013  . Vertigo 04/30/2013  . Onychomycosis 01/26/2013  . Chronic renal insufficiency, stage IV (severe) ([place]) 09/07/2012  . BIVentricular Defibrillator--St Jude 04/20/2011  . Dilated cardiomyopathy ([place]) 08/07/2009  . Osteoarthritis 04/18/2009  . SKIN CANCER, HX OF 04/18/2009  . Sleep apnea 11/01/2008  . Diabetes type 2, uncontrolled ([place]) 07/13/2006  . Hyperlipidemia 07/13/2006  . Essential hypertension 07/13/2006  . CAD (coronary artery disease) 07/13/2006  . Chronic atrial fibrillation ([place]) 07/13/2006  . Chronic systolic  CHF (congestive heart failure) ([place]) 07/13/2006  . COLON CANCER, HX OF 07/13/2006  . PROSTATE CANCER, HX OF 07/13/2006    Past Surgical History:  Procedure Laterality Date  . ABLATION  08/2013   AVN ablation by Dr Lovena Le  . AV FISTULA PLACEMENT  03/30/2012   Procedure: ARTERIOVENOUS (AV) FISTULA CREATION;  Surgeon: Angelia Mould, MD;  Location: [facility];  Service: Vascular;  Laterality: Right;  Creation of BrachioCephalic Fistula Right arm  . AV FISTULA PLACEMENT Left 07/01/2016   Procedure: INSERTION OF ARTERIOVENOUS (AV) GORE-TEX GRAFT Left THIGH;  Surgeon: Elam Dutch,  MD;  Location: [facility];  Service: Vascular;  Laterality: Left;  . AV NODE ABLATION N/A 09/12/2013   Procedure: AV NODE ABLATION;  Surgeon: [name], MD;  Location: [facility] CATH LAB;  Service: Cardiovascular;  Laterality: N/A;  . BI-VENTRICULAR IMPLANTABLE CARDIOVERTER DEFIBRILLATOR  (CRT-D)  2010   STJ CRTD implanted by Dr [name]  . CARDIAC CATHETERIZATION N/A 05/09/2015   Procedure: Left Heart Cath and Coronary Angiography;  Surgeon: Sherren Mocha, MD;  Location: [facility] CV LAB;  Service: Cardiovascular;  Laterality: N/A;  . CARDIAC CATHETERIZATION N/A 05/16/2015   Procedure: Coronary Stent Intervention;  Surgeon: Sherren Mocha, MD;  Location: [facility] CV LAB;  Service: Cardiovascular;  Laterality: N/A;  . COLECTOMY    . EYE SURGERY     left eye prothesis  . FRACTURE SURGERY     collar bone, right knee replacement  . INSERT / REPLACE / REMOVE PACEMAKER    . INSERTION OF DIALYSIS CATHETER N/A 05/20/2016   Procedure: INSERTION OF DIALYSIS CATHETER;  Surgeon: Elam Dutch, MD;  Location: [facility] [place];  Service: Vascular;  Laterality: N/A;  . JOINT REPLACEMENT    . LIGATION OF ARTERIOVENOUS  FISTULA Right 05/20/2016   Procedure: LIGATION OF RIGHT ARTERIOVENOUS  FISTULA;  Surgeon: Elam Dutch, MD;  Location: [facility];  Service: Vascular;  Laterality: Right;  . PACEMAKER INSERTION  2010  . PENILE PROSTHESIS PLACEMENT    . PROSTATECTOMY    . PTCA     stent placed  . removed prosthetic eye    . TOTAL KNEE ARTHROPLASTY  2008   right  . VASCULAR SURGERY  07/01/2016   INSERTION OF ARTERIOVENOUS (AV) GORE-TEX GRAFT THIGH (Left) as a surgical intervention        Home Medications    Prior to Admission medications   Medication Sig Start Date End Date Taking? Authorizing Provider  acetaminophen (TYLENOL) 500 MG tablet Take 1,000 mg by mouth every 4 (four) hours as needed for mild pain.     Historical Provider, MD  aspirin EC 81 MG tablet Take 1 tablet (81 mg total) by mouth daily. 11/09/15    Liliane Shi, PA-C  calcium carbonate (TUMS EX) 750 MG chewable tablet Chew 1 tablet by mouth 3 (three) times daily with meals.    Historical Provider, MD  digoxin (DIGOX) 0.125 MG tablet Take 125 mcg by mouth every other day 05/20/16   Alvia Grove, PA-C  docusate sodium (COLACE) 100 MG capsule Take 300 mg by mouth daily.    Historical Provider, MD  HUMALOG MIX 75/25 (75-25) 100 UNIT/ML SUSP injection Inject subcutaneously 26  units two times daily with  meals 11/27/15   [name], MD  hydrALAZINE (APRESOLINE) 25 MG tablet Take 25 mg by mouth See admin instructions. Take 25 mg by mouth twice daily on Sundays,tuesdays,Thursdays, and Saturdays.  Take 25 mg by mouth in the  evening on Mondays,Wed,and Fridays    Historical Provider, MD  isosorbide mononitrate (IMDUR) 30 MG 24 hr tablet Take 1 tablet by mouth  daily 05/20/16   Alvia Grove, PA-C  metoprolol succinate (TOPROL-XL) 100 MG 24 hr tablet TAKE 50 MG BY MOUTH  TWICE A DAY, WITH OR  IMMEDIATELY FOLLOWING MEALS 05/20/16   Alvia Grove, PA-C  multivitamin (RENA-VIT) TABS tablet Take 1 tablet by mouth daily.    Historical Provider, MD  nitroGLYCERIN (NITROSTAT) 0.4 MG SL tablet Place 0.4 mg under the tongue every 5 (five) minutes as needed for chest pain.    Historical Provider, MD  oxyCODONE-acetaminophen (PERCOCET/ROXICET) 5-325 MG tablet Take 1 tablet by mouth every 6 (six) hours as needed. 07/01/16   Ulyses Amor, PA-C  warfarin (COUMADIN) 5 MG tablet Take 0.5-1 tablets (2.5-5 mg total) by mouth See admin instructions. Take 5 mg daily except 2.5 mg on Mondays OR as directed by anticoagulation clinic. 07/02/16   Alvia Grove, PA-C    Family History Family History  Problem Relation Age of Onset  . Heart disease Father   . Throat cancer Father   . Throat cancer Mother   . Heart disease Mother   . Hypertension Mother     Social History Social History  Substance Use Topics  . Smoking status: Former Smoker     Types: Cigarettes    Quit date: 09/22/1966  . Smokeless tobacco: Never Used  . Alcohol use 0.0 - 0.6 oz/week     Comment: an occasional beer     Allergies   Ace inhibitors and Prednisone   Review of Systems Review of Systems  Constitutional: Negative for chills and fever.  Respiratory: Positive for cough, chest tightness and shortness of breath.   Cardiovascular: Positive for chest pain and leg swelling. Negative for palpitations.  Gastrointestinal: Negative for abdominal distention, abdominal pain, diarrhea, nausea and vomiting.  Musculoskeletal: Negative for arthralgias, myalgias, neck pain and neck stiffness.  Skin: Negative for rash.  Allergic/Immunologic: Negative for immunocompromised state.  Neurological: Negative for dizziness, weakness, light-headedness, numbness and headaches.  All other systems reviewed and are negative.    Physical Exam Updated Vital Signs BP (!) 134/53 (BP Location: Left Arm)   Pulse 75   Temp 97.7 F (36.5 C) (Oral)   Resp 22   SpO2 97%   Physical Exam  Constitutional: He appears well-developed and well-nourished. No distress.  HENT:  Head: Normocephalic and atraumatic.  Eyes: Conjunctivae are normal.  Neck: Neck supple.  Cardiovascular: Normal rate and regular rhythm.   Murmur heard. Pulmonary/Chest: Effort normal. No respiratory distress. He has no wheezes. He has rales.  Rales at bases bilaterally  Abdominal: Soft. Bowel sounds are normal. He exhibits no distension. There is no tenderness. There is no rebound.  Musculoskeletal: He exhibits edema.  Trace lower extremity edema bilaterally  Neurological: He is alert.  Skin: Skin is warm and dry.  Nursing note and vitals reviewed.    ED Treatments / Results  Labs (all labs ordered are listed, but only abnormal results are displayed) Labs Reviewed  BRAIN NATRIURETIC PEPTIDE - Abnormal; Notable for the following:       Result Value   B Natriuretic Peptide 1,725.3 (*)    All other  components within normal limits  CBC WITH DIFFERENTIAL/PLATELET - Abnormal; Notable for the following:    RBC 2.76 (*)    Hemoglobin 9.2 (*)    HCT 28.6 (*)    MCV 103.6 (*)  Platelets 134 (*)    All other components within normal limits  COMPREHENSIVE METABOLIC PANEL - Abnormal; Notable for the following:    Chloride 95 (*)    Glucose, Bld 237 (*)    BUN 60 (*)    Creatinine, Ser 6.75 (*)    Total Protein 6.2 (*)    Albumin 3.3 (*)    GFR calc non Af Amer 7 (*)    GFR calc Af Amer 8 (*)    Anion gap 18 (*)    All other components within normal limits  TROPONIN I - Abnormal; Notable for the following:    Troponin I 0.08 (*)    All other components within normal limits  TROPONIN I  TROPONIN I  BASIC METABOLIC PANEL  CBC  PROTIME-INR  I-STAT TROPOININ, ED    EKG  EKG Interpretation None       Radiology Dg Chest 2 View  Result Date: 07/13/2016 CLINICAL DATA:  Cough x2 weeks EXAM: CHEST  2 VIEW COMPARISON:  07/06/2016 FINDINGS: AP and lateral views of the chest demonstrate right-sided central venous catheter tip overlying the right atrium. A left-sided ICD device is similar in position. There are small bilateral effusions, increased compared to prior. Mild hazy bibasilar atelectasis or infiltrates. Slight interval enlargement of the cardiomediastinal silhouette with mild central congestion. Atherosclerosis of the aorta. No pneumothorax. IMPRESSION: 1. Cardiomegaly with mild central vascular congestion. 2. Development of small bilateral pleural effusions. Hazy atelectasis, infiltrate or edema within the lung bases. 3. Atherosclerotic vascular disease of the aorta. Electronically Signed   By: Donavan Foil M.D.   On: 07/13/2016 20:28    Procedures Procedures (including critical care time)  Medications Ordered in ED Medications - No data to display   Initial Impression / Assessment and Plan / ED Course  I have reviewed the triage vital signs and the nursing  notes.  Pertinent labs & imaging results that were available during my care of the patient were reviewed by me and considered in my medical decision making (see chart for details).  Clinical Course   7:32 PM Patient seen and examined. Patient with shortness of breath that has progressively worsened last few days. Patient with CHF, A. fib, coronary disease. Patient's EF is less than 30%. He is on dialysis. Last dialyzed 2 days ago. Patient with Rales at bases on lung exam. Suspect fluid overload. Will get labs, chest x-ray, will monitor.  EKG showed paced rhythm. Labs and chest x-ray pending.  9:53 PM  patient's chest x-ray showing vascular congestion with developing bilateral pleural effusions and atelectasis versus infiltrate versus edema at bases. Patient denies fever. White blood cell count is normal. I doubt this is pneumonia. Most likely fluid overload. I discussed patient with the hospitalist. Given symptomatic shortness of breath will bring him in. Will need dialysis. House to call nephrology.  10:40 PM Spoke with Dr. Jonnie Finner, nephrology, will see pt in AM  Final Clinical Impressions(s) / ED Diagnoses   Final diagnoses:  Dyspnea, unspecified type  Congestive heart failure, unspecified congestive heart failure chronicity, unspecified congestive heart failure type [facility])    New Prescriptions Current Discharge Medication List       Jeannett Senior, PA-C 07/14/16 0114    Merrily Pew, MD 07/14/16 [phone]

## 2016-07-13 NOTE — H&P (Signed)
History and Physical    [name] [name]. [id number] DOB: [birth year] DOA: 07/13/2016  PCP: Nyoka Cowden, MD  Patient coming from:  home  Chief Complaint:  Sob with exertion  HPI: [name] [name]. is a 82 y.o. male with medical history significant of  ESRD, chronic afib on coumadin, CHF comes in with 3 weeks of dyspnea on exertion without chest pain.  Pt says his dialysis has not been going well, for over a week his sessions have been cut short due to leg cramping and this past Friday he reports no fluid was taken off at all.  He denies fevers.  No pnd or orthpnea.  Denies any peripheral edema.  He was [place] last week and they told him he had fluid on his lungs that dialysis should fix.  He is concerned that he is still having this sob with any activity.  Pt referred for admission for need for dialysis.  Review of Systems: As per HPI otherwise 10 point review of systems negative.   Past Medical History:  Diagnosis Date  . CARDIOMYOPATHY, PRIMARY, DILATED    a. Mixed ICM/NICM - EF 15% by echo 05/2009; St. Jude CRT-D implanted 06/2009. b. Echo 04/2013 - EF 20%, mild LVH.  [name] Chronic atrial fibrillation ([place])    a. Historically difficult rate control. b. s/p CRT-D implantation with anticipation of AV junction ablation but patient then was able to accomplish rate control and ablation not undertaken.; AVN ablation 08/2013 by Dr Lovena Le  . Chronic systolic CHF (congestive heart failure) ([place])   . Colon cancer ([place])   . CORONARY ARTERY DISEASE    a. BMS to LAD 04/2008. b. Cath 06/2009: nonobstructive disease. c. cath 05/16/2015 DES to prox LAD  . Diabetes mellitus   . Dyspnea    with exertion  . ESRD (end stage renal disease) ([place])    M_W_F hemodialysis  . History of blood transfusion    after colon surgery  . Hyperlipidemia   . Hypertension   . Osteoarthritis   . Pneumonia 2014ish  . Presence of permanent cardiac pacemaker   . Prostate cancer [facility])    a. s/p  radical prostatectomy.  . Pulmonary HTN    a. PA pressure 74mmHg by echo 05/01/13.  [name] Secondary hyperparathyroidism ([place])   . Skin cancer   . SLEEP APNEA    wears CPAP  . Tubular adenoma of colon   . Valvular heart disease    a. Echo 04/2013: mild AI, mild MR.  . Vertigo     Past Surgical History:  Procedure Laterality Date  . ABLATION  08/2013   AVN ablation by Dr Lovena Le  . AV FISTULA PLACEMENT  03/30/2012   Procedure: ARTERIOVENOUS (AV) FISTULA CREATION;  Surgeon: Angelia Mould, MD;  Location: [place];  Service: Vascular;  Laterality: Right;  Creation of BrachioCephalic Fistula Right arm  . AV FISTULA PLACEMENT Left 07/01/2016   Procedure: INSERTION OF ARTERIOVENOUS (AV) GORE-TEX GRAFT Left THIGH;  Surgeon: Elam Dutch, MD;  Location: [place];  Service: Vascular;  Laterality: Left;  . AV NODE ABLATION N/A 09/12/2013   Procedure: AV NODE ABLATION;  Surgeon: [name], MD;  Location: [facility] C[facility] LAB;  Service: Cardiovascular;  Laterality: N/A;  . BI-VENTRICULAR IMPLANTABLE CARDIOVERTER DEFIBRILLATOR  (CRT-D)  2010   STJ CRTD implanted by Dr [name]  . CARDIAC CATHETERIZATION N/A 05/09/2015   Procedure: Left Heart Cath and Coronary Angiography;  Surgeon: Sherren Mocha, MD;  Location: [place]  CV LAB;  Service: Cardiovascular;  Laterality: N/A;  . CARDIAC CATHETERIZATION N/A 05/16/2015   Procedure: Coronary Stent Intervention;  Surgeon: Sherren Mocha, MD;  Location: [place] CV LAB;  Service: Cardiovascular;  Laterality: N/A;  . COLECTOMY    . EYE SURGERY     left eye prothesis  . FRACTURE SURGERY     collar bone, right knee replacement  . INSERT / REPLACE / REMOVE PACEMAKER    . INSERTION OF DIALYSIS CATHETER N/A 05/20/2016   Procedure: INSERTION OF DIALYSIS CATHETER;  Surgeon: Elam Dutch, MD;  Location: [place];  Service: Vascular;  Laterality: N/A;  . JOINT REPLACEMENT    . LIGATION OF ARTERIOVENOUS  FISTULA Right 05/20/2016   Procedure: LIGATION OF RIGHT  ARTERIOVENOUS  FISTULA;  Surgeon: Elam Dutch, MD;  Location: [place];  Service: Vascular;  Laterality: Right;  . PACEMAKER INSERTION  2010  . PENILE PROSTHESIS PLACEMENT    . PROSTATECTOMY    . PTCA     stent placed  . removed prosthetic eye    . TOTAL KNEE ARTHROPLASTY  2008   right  . VASCULAR SURGERY  07/01/2016   INSERTION OF ARTERIOVENOUS (AV) GORE-TEX GRAFT THIGH (Left) as a surgical intervention      reports that he quit smoking about 49 years ago. His smoking use included Cigarettes. He has never used smokeless tobacco. He reports that he drinks alcohol. He reports that he does not use drugs.  Allergies  Allergen Reactions  . Ace Inhibitors Other (See Comments)    Stopped by Dr. Burt Knack due to progressive renal failure  . Prednisone Other (See Comments)    Raised blood sugar to almost 900    Family History  Problem Relation Age of Onset  . Heart disease Father   . Throat cancer Father   . Throat cancer Mother   . Heart disease Mother   . Hypertension Mother     Prior to Admission medications   Medication Sig Start Date End Date Taking? Authorizing Provider  acetaminophen (TYLENOL) 500 MG tablet Take 1,000 mg by mouth every 4 (four) hours as needed for mild pain.    Yes Historical Provider, MD  aspirin EC 81 MG tablet Take 1 tablet (81 mg total) by mouth daily. 11/09/15  Yes Liliane Shi, PA-C  calcium carbonate (TUMS EX) 750 MG chewable tablet Chew 1 tablet by mouth 3 (three) times daily with meals.   Yes Historical Provider, MD  docusate sodium (COLACE) 100 MG capsule Take 300 mg by mouth daily.   Yes Historical Provider, MD  HUMALOG MIX 75/25 (75-25) 100 UNIT/ML SUSP injection Inject subcutaneously 26  units two times daily with  meals 11/27/15  Yes [name], MD  hydrALAZINE (APRESOLINE) 25 MG tablet Take 25 mg by mouth See admin instructions. Take 25 mg by mouth twice daily on Sundays,tuesdays,Thursdays, and Saturdays.  Take 25 mg by mouth in the  evening on Mondays,Wed,and Fridays   Yes Historical Provider, MD  isosorbide mononitrate (IMDUR) 30 MG 24 hr tablet Take 1 tablet by mouth  daily 05/20/16  Yes Alvia Grove, PA-C  metoprolol succinate (TOPROL-XL) 100 MG 24 hr tablet TAKE 50 MG BY MOUTH  TWICE A DAY, WITH OR  IMMEDIATELY FOLLOWING MEALS 05/20/16  Yes Alvia Grove, PA-C  multivitamin (RENA-VIT) TABS tablet Take 1 tablet by mouth daily.   Yes Historical Provider, MD  nitroGLYCERIN (NITROSTAT) 0.4 MG SL tablet Place 0.4 mg under the tongue every 5 (five)  minutes as needed for chest pain.   Yes Historical Provider, MD  warfarin (COUMADIN) 5 MG tablet Take 0.5-1 tablets (2.5-5 mg total) by mouth See admin instructions. Take 5 mg daily except 2.5 mg on Mondays OR as directed by anticoagulation clinic. 07/02/16  Yes Alvia Grove, PA-C  digoxin (DIGOX) 0.125 MG tablet Take 125 mcg by mouth every other day Patient not taking: Reported on 07/13/2016 05/20/16   Alvia Grove, PA-C  oxyCODONE-acetaminophen (PERCOCET/ROXICET) 5-325 MG tablet Take 1 tablet by mouth every 6 (six) hours as needed. Patient not taking: Reported on 07/13/2016 07/01/16   Ulyses Amor, PA-C    Physical Exam: Vitals:   07/13/16 1855  BP: (!) 134/53  Pulse: 75  Resp: 22  Temp: 97.7 F (36.5 C)  TempSrc: Oral  SpO2: 97%    Constitutional: NAD, calm, comfortable Vitals:   07/13/16 1855  BP: (!) 134/53  Pulse: 75  Resp: 22  Temp: 97.7 F (36.5 C)  TempSrc: Oral  SpO2: 97%   Eyes: PERRL, lids and conjunctivae normal ENMT: Mucous membranes are moist. Posterior pharynx clear of any exudate or lesions.Normal dentition.  Neck: normal, supple, no masses, no thyromegaly Respiratory: clear to auscultation bilaterally, no wheezing, no crackles. Normal respiratory effort. No accessory muscle use.  Cardiovascular: Regular rate and rhythm, no murmurs / rubs / gallops. No extremity edema. 2+ pedal pulses. No carotid bruits.  Abdomen: no tenderness, no  masses palpated. No hepatosplenomegaly. Bowel sounds positive.  Musculoskeletal: no clubbing / cyanosis. No joint deformity upper and lower extremities. Good ROM, no contractures. Normal muscle tone.  Skin: no rashes, lesions, ulcers. No induration Neurologic: CN 2-12 grossly intact. Sensation intact, DTR normal. Strength 5/5 in all 4.  Psychiatric: Normal judgment and insight. Alert and oriented x 3. Normal mood.    Labs on Admission: I have personally reviewed following labs and imaging studies  CBC:  Recent Labs Lab 07/13/16 1958  WBC 7.5  NEUTROABS 5.6  HGB 9.2*  HCT 28.6*  MCV 103.6*  PLT [id number]*   Basic Metabolic Panel:  Recent Labs Lab 07/13/16 1958  NA 139  K 4.3  CL 95*  CO2 26  GLUCOSE 237*  BUN 60*  CREATININE 6.75*  CALCIUM 9.2   GFR: Estimated Creatinine Clearance: 8.8 mL/min (by C-G formula based on SCr of 6.75 mg/dL (H)). Liver Function Tests:  Recent Labs Lab 07/13/16 1958  AST 23  ALT 20  ALKPHOS 38  BILITOT 0.7  PROT 6.2*  ALBUMIN 3.3*    Urine analysis:    Component Value Date/Time   COLORURINE YELLOW 02/16/2014 2229   APPEARANCEUR CLEAR 02/16/2014 2229   LABSPEC 1.017 02/16/2014 2229   PHURINE 5.5 02/16/2014 2229   GLUCOSEU 100 (A) 02/16/2014 2229   HGBUR NEGATIVE 02/16/2014 2229   HGBUR 1+ 08/25/2007 0809   BILIRUBINUR NEGATIVE 02/16/2014 2229   BILIRUBINUR small 08/08/2013 1132   KETONESUR NEGATIVE 02/16/2014 2229   PROTEINUR 100 (A) 02/16/2014 2229   UROBILINOGEN 0.2 02/16/2014 2229   NITRITE NEGATIVE 02/16/2014 2229   LEUKOCYTESUR NEGATIVE 02/16/2014 2229   Radiological Exams on Admission: Dg Chest 2 View  Result Date: 07/13/2016 CLINICAL DATA:  Cough x2 weeks EXAM: CHEST  2 VIEW COMPARISON:  07/06/2016 FINDINGS: AP and lateral views of the chest demonstrate right-sided central venous catheter tip overlying the right atrium. A left-sided ICD device is similar in position. There are small bilateral effusions, increased  compared to prior. Mild hazy bibasilar atelectasis or infiltrates. Slight interval enlargement  of the cardiomediastinal silhouette with mild central congestion. Atherosclerosis of the aorta. No pneumothorax. IMPRESSION: 1. Cardiomegaly with mild central vascular congestion. 2. Development of small bilateral pleural effusions. Hazy atelectasis, infiltrate or edema within the lung bases. 3. Atherosclerotic vascular disease of the aorta. Electronically Signed   By: Donavan Foil M.D.   On: 07/13/2016 20:28    EKG: Independently reviewed.  Paced  Assessment/Plan 82 yo dialysis pt comes in with 3 weeks of DOE  Principal Problem:   Dyspnea on exertion- obs pt on tele overnight and await nephrology recommendations.  He is due for routine dialysis in the am, no emergent need tonight. Serial troponin overnight.  Active Problems:   Diabetes type 2, uncontrolled (HCC)   CAD (coronary artery disease)   Dilated cardiomyopathy (HCC)   Chronic atrial fibrillation (HCC)   Chronic systolic CHF (congestive heart failure) (HCC)   BIVentricular Defibrillator--St Jude   ESRD (end stage renal disease) ([place])  Med rec is tagged as completed by pharm but it is not, have requested another med rec review by pharm  DVT prophylaxis:  scds Code Status:  full Family Communication:  none Disposition Plan:  Per day team Consults called:  nephrology Admission status:  observation   Aashrith Eves A MD [facility]  If 7PM-7AM, please contact night-coverage [email or web address] Password [facility]  07/13/2016, 9:51 PM

## 2016-07-13 NOTE — ED Provider Notes (Signed)
Medical screening examination/treatment/procedure(s) were conducted as a shared visit with non-physician practitioner(s) and myself.  I personally evaluated the patient during the encounter.  82 -year-old male on dialysis and history of heart failure as 3 weeks worsening dyspnea. Has improved with dialysis. The point now where he can't take 3 steps without having to stop arrest secondary to his dyspnea. On exam he has crackles in his bases no lower extremity edema. He is afebrile, heart rate regular rate and rhythm with no murmurs rubs or gallops. Will evaluate and likely admit for more dialysis sessions.   My ECG Read Indication:dyspnea EKG was personally contemporaneously reviewed by myself. Rate: 75 PR Interval: not calculable QRS duration: 221 QT/QTC: 484/541 Axis: left EKG: unchanged from previous tracings, LBBB or paced. Other significant findings: none    Merrily Pew, MD 07/14/16 0028

## 2016-07-14 ENCOUNTER — Ambulatory Visit: Payer: Medicare Other

## 2016-07-14 DIAGNOSIS — R0609 Other forms of dyspnea: Secondary | ICD-10-CM | POA: Diagnosis not present

## 2016-07-14 DIAGNOSIS — Z4502 Encounter for adjustment and management of automatic implantable cardiac defibrillator: Secondary | ICD-10-CM

## 2016-07-14 DIAGNOSIS — R778 Other specified abnormalities of plasma proteins: Secondary | ICD-10-CM | POA: Diagnosis present

## 2016-07-14 DIAGNOSIS — E1165 Type 2 diabetes mellitus with hyperglycemia: Secondary | ICD-10-CM | POA: Diagnosis present

## 2016-07-14 DIAGNOSIS — Z955 Presence of coronary angioplasty implant and graft: Secondary | ICD-10-CM | POA: Diagnosis not present

## 2016-07-14 DIAGNOSIS — M898X9 Other specified disorders of bone, unspecified site: Secondary | ICD-10-CM | POA: Diagnosis present

## 2016-07-14 DIAGNOSIS — Z992 Dependence on renal dialysis: Secondary | ICD-10-CM | POA: Diagnosis not present

## 2016-07-14 DIAGNOSIS — Z87891 Personal history of nicotine dependence: Secondary | ICD-10-CM | POA: Diagnosis not present

## 2016-07-14 DIAGNOSIS — Z9079 Acquired absence of other genital organ(s): Secondary | ICD-10-CM | POA: Diagnosis not present

## 2016-07-14 DIAGNOSIS — I132 Hypertensive heart and chronic kidney disease with heart failure and with stage 5 chronic kidney disease, or end stage renal disease: Secondary | ICD-10-CM | POA: Diagnosis present

## 2016-07-14 DIAGNOSIS — I482 Chronic atrial fibrillation: Secondary | ICD-10-CM

## 2016-07-14 DIAGNOSIS — N186 End stage renal disease: Secondary | ICD-10-CM

## 2016-07-14 DIAGNOSIS — I420 Dilated cardiomyopathy: Secondary | ICD-10-CM

## 2016-07-14 DIAGNOSIS — D631 Anemia in chronic kidney disease: Secondary | ICD-10-CM | POA: Diagnosis present

## 2016-07-14 DIAGNOSIS — Z7901 Long term (current) use of anticoagulants: Secondary | ICD-10-CM | POA: Diagnosis not present

## 2016-07-14 DIAGNOSIS — M1990 Unspecified osteoarthritis, unspecified site: Secondary | ICD-10-CM | POA: Diagnosis present

## 2016-07-14 DIAGNOSIS — E1122 Type 2 diabetes mellitus with diabetic chronic kidney disease: Secondary | ICD-10-CM | POA: Diagnosis present

## 2016-07-14 DIAGNOSIS — Z950 Presence of cardiac pacemaker: Secondary | ICD-10-CM | POA: Diagnosis not present

## 2016-07-14 DIAGNOSIS — E8779 Other fluid overload: Secondary | ICD-10-CM | POA: Diagnosis present

## 2016-07-14 DIAGNOSIS — Z66 Do not resuscitate: Secondary | ICD-10-CM | POA: Diagnosis present

## 2016-07-14 DIAGNOSIS — I5022 Chronic systolic (congestive) heart failure: Secondary | ICD-10-CM

## 2016-07-14 DIAGNOSIS — I5023 Acute on chronic systolic (congestive) heart failure: Secondary | ICD-10-CM | POA: Diagnosis present

## 2016-07-14 DIAGNOSIS — I2510 Atherosclerotic heart disease of native coronary artery without angina pectoris: Secondary | ICD-10-CM

## 2016-07-14 DIAGNOSIS — Z8546 Personal history of malignant neoplasm of prostate: Secondary | ICD-10-CM | POA: Diagnosis not present

## 2016-07-14 DIAGNOSIS — N2581 Secondary hyperparathyroidism of renal origin: Secondary | ICD-10-CM | POA: Diagnosis present

## 2016-07-14 DIAGNOSIS — E785 Hyperlipidemia, unspecified: Secondary | ICD-10-CM | POA: Diagnosis present

## 2016-07-14 DIAGNOSIS — G4730 Sleep apnea, unspecified: Secondary | ICD-10-CM | POA: Diagnosis present

## 2016-07-14 LAB — CBC
HEMATOCRIT: 30.3 % — AB (ref 39.0–52.0)
Hemoglobin: 9.6 g/dL — ABNORMAL LOW (ref 13.0–17.0)
MCH: 33.1 pg (ref 26.0–34.0)
MCHC: 31.7 g/dL (ref 30.0–36.0)
MCV: 104.5 fL — AB (ref 78.0–100.0)
Platelets: 160 10*3/uL (ref 150–400)
RBC: 2.9 MIL/uL — ABNORMAL LOW (ref 4.22–5.81)
RDW: 15.8 % — AB (ref 11.5–15.5)
WBC: 9.9 10*3/uL (ref 4.0–10.5)

## 2016-07-14 LAB — GLUCOSE, CAPILLARY
GLUCOSE-CAPILLARY: 204 mg/dL — AB (ref 65–99)
GLUCOSE-CAPILLARY: 242 mg/dL — AB (ref 65–99)
Glucose-Capillary: 202 mg/dL — ABNORMAL HIGH (ref 65–99)
Glucose-Capillary: 158 mg/dL — ABNORMAL HIGH (ref 65–99)

## 2016-07-14 LAB — BASIC METABOLIC PANEL
Anion gap: 17 — ABNORMAL HIGH (ref 5–15)
BUN: 68 mg/dL — ABNORMAL HIGH (ref 6–20)
CO2: 22 mmol/L (ref 22–32)
Calcium: 9.1 mg/dL (ref 8.9–10.3)
Chloride: 99 mmol/L — ABNORMAL LOW (ref 101–111)
Creatinine, Ser: 7.17 mg/dL — ABNORMAL HIGH (ref 0.61–1.24)
GFR calc Af Amer: 7 mL/min — ABNORMAL LOW (ref >60)
GFR, EST NON AFRICAN AMERICAN: 6 mL/min — AB (ref >60)
GLUCOSE: 224 mg/dL — AB (ref 65–99)
POTASSIUM: 4.8 mmol/L (ref 3.5–5.1)
Sodium: 138 mmol/L (ref 135–145)

## 2016-07-14 LAB — TROPONIN I
TROPONIN I: 0.08 ng/mL — AB (ref <0.03)
TROPONIN I: 0.13 ng/mL — AB (ref <0.03)
Troponin I: 0.08 ng/mL (ref <0.03)

## 2016-07-14 LAB — PROTIME-INR
INR: 2.41
Prothrombin Time: 26.7 seconds — ABNORMAL HIGH (ref 11.4–15.2)

## 2016-07-14 MED ORDER — ASPIRIN EC 81 MG PO TBEC
81.0000 mg | DELAYED_RELEASE_TABLET | Freq: Every day | ORAL | Status: DC
Start: 2016-07-14 — End: 2016-07-15
  Administered 2016-07-14 – 2016-07-15 (×2): 81 mg via ORAL
  Filled 2016-07-14 (×2): qty 1

## 2016-07-14 MED ORDER — INSULIN ASPART 100 UNIT/ML ~~LOC~~ SOLN
0.0000 [IU] | Freq: Three times a day (TID) | SUBCUTANEOUS | Status: DC
  Administered 2016-07-14 – 2016-07-15 (×2): 3 [IU] via SUBCUTANEOUS

## 2016-07-14 MED ORDER — PENTAFLUOROPROP-TETRAFLUOROETH EX AERO: 1.0000 "application " | INHALATION_SPRAY | CUTANEOUS | Status: DC | PRN

## 2016-07-14 MED ORDER — HEPARIN SODIUM (PORCINE) 1000 UNIT/ML DIALYSIS: 1000.0000 [IU] | INTRAMUSCULAR | Status: DC | PRN

## 2016-07-14 MED ORDER — WARFARIN SODIUM 5 MG PO TABS
5.0000 mg | ORAL_TABLET | Freq: Once | ORAL | Status: AC
  Administered 2016-07-14: 5 mg via ORAL
  Filled 2016-07-14: qty 1

## 2016-07-14 MED ORDER — RENA-VITE PO TABS
1.0000 | ORAL_TABLET | Freq: Every day | ORAL | Status: DC
  Administered 2016-07-14: 1 via ORAL
  Filled 2016-07-14: qty 1

## 2016-07-14 MED ORDER — NITROGLYCERIN 0.4 MG SL SUBL: 0.4000 mg | SUBLINGUAL_TABLET | SUBLINGUAL | Status: DC | PRN

## 2016-07-14 MED ORDER — SODIUM CHLORIDE 0.9 % IV SOLN: 100.0000 mL | INTRAVENOUS | Status: DC | PRN

## 2016-07-14 MED ORDER — LIDOCAINE-PRILOCAINE 2.5-2.5 % EX CREA: 1.0000 "application " | TOPICAL_CREAM | CUTANEOUS | Status: DC | PRN

## 2016-07-14 MED ORDER — ALTEPLASE 2 MG IJ SOLR: 2.0000 mg | Freq: Once | INTRAMUSCULAR | Status: DC | PRN

## 2016-07-14 MED ORDER — METOPROLOL SUCCINATE ER 50 MG PO TB24: 50.0000 mg | ORAL_TABLET | ORAL | Status: DC

## 2016-07-14 MED ORDER — CALCIUM CARBONATE ANTACID 500 MG PO CHEW: 750.0000 mg | CHEWABLE_TABLET | Freq: Three times a day (TID) | ORAL | Status: DC

## 2016-07-14 MED ORDER — RENA-VITE PO TABS: 1.0000 | ORAL_TABLET | Freq: Every day | ORAL | Status: DC

## 2016-07-14 MED ORDER — DARBEPOETIN ALFA 100 MCG/0.5ML IJ SOSY
100.0000 ug | PREFILLED_SYRINGE | INTRAMUSCULAR | Status: DC
  Administered 2016-07-15: 100 ug via INTRAVENOUS
  Filled 2016-07-14: qty 0.5

## 2016-07-14 MED ORDER — METOPROLOL SUCCINATE ER 50 MG PO TB24
50.0000 mg | ORAL_TABLET | ORAL | Status: DC
  Administered 2016-07-14: 50 mg via ORAL
  Filled 2016-07-14: qty 1

## 2016-07-14 MED ORDER — HYDRALAZINE HCL 25 MG PO TABS: 25.0000 mg | ORAL_TABLET | ORAL | Status: DC

## 2016-07-14 MED ORDER — DOXERCALCIFEROL 4 MCG/2ML IV SOLN: 1.0000 ug | INTRAVENOUS | Status: DC

## 2016-07-14 MED ORDER — HEPARIN SODIUM (PORCINE) 1000 UNIT/ML DIALYSIS: 2000.0000 [IU] | INTRAMUSCULAR | Status: DC | PRN

## 2016-07-14 MED ORDER — CALCIUM CARBONATE ANTACID 500 MG PO CHEW
750.0000 mg | CHEWABLE_TABLET | Freq: Three times a day (TID) | ORAL | Status: DC
  Administered 2016-07-14 – 2016-07-15 (×3): 750 mg via ORAL
  Filled 2016-07-14 (×3): qty 4

## 2016-07-14 MED ORDER — DOCUSATE SODIUM 100 MG PO CAPS
300.0000 mg | ORAL_CAPSULE | Freq: Every day | ORAL | Status: DC
  Administered 2016-07-14 – 2016-07-15 (×2): 300 mg via ORAL
  Filled 2016-07-14 (×2): qty 3

## 2016-07-14 MED ORDER — LIDOCAINE HCL (PF) 1 % IJ SOLN: 5.0000 mL | INTRAMUSCULAR | Status: DC | PRN

## 2016-07-14 MED ORDER — DIGOXIN 125 MCG PO TABS
0.1250 mg | ORAL_TABLET | ORAL | Status: DC
  Administered 2016-07-14: 0.125 mg via ORAL
  Filled 2016-07-14: qty 1

## 2016-07-14 MED ORDER — WARFARIN - PHARMACIST DOSING INPATIENT
Freq: Every day | Status: DC
  Administered 2016-07-14: 18:00:00

## 2016-07-14 MED ORDER — SUCROFERRIC OXYHYDROXIDE 500 MG PO CHEW
500.0000 mg | CHEWABLE_TABLET | Freq: Three times a day (TID) | ORAL | Status: DC
  Administered 2016-07-14 – 2016-07-15 (×3): 500 mg via ORAL
  Filled 2016-07-14 (×4): qty 1

## 2016-07-14 MED ORDER — ISOSORBIDE MONONITRATE ER 30 MG PO TB24
30.0000 mg | ORAL_TABLET | Freq: Every day | ORAL | Status: DC
  Administered 2016-07-15: 30 mg via ORAL
  Filled 2016-07-14: qty 1

## 2016-07-14 NOTE — Procedures (Signed)
I was present at this session.  I have reviewed the session itself and made appropriate changes.  BFR 350. Vol xs. bp tol well  Estanislao Harmon L 10/23/20179:14 AM

## 2016-07-14 NOTE — Progress Notes (Signed)
PROGRESS NOTE    [name] [name].  [id number] DOB: August 31, 1931 DOA: 07/13/2016 PCP: Nyoka Cowden, MD   Chief Complaint  Patient presents with  . Shortness of Breath    Brief Narrative:  HPI on 07/13/2016 by Dr. Raenette Rover. is a 82 y.o. male with medical history significant of  ESRD, chronic afib on coumadin, CHF comes in with 3 weeks of dyspnea on exertion without chest pain.  Pt says his dialysis has not been going well, for over a week his sessions have been cut short due to leg cramping and this past Friday he reports no fluid was taken off at all.  He denies fevers.  No pnd or orthpnea.  Denies any peripheral edema.  He was [place] and they told him he had fluid on his lungs that dialysis should fix.  He is concerned that he is still having this sob with any activity.  Pt referred for admission for need for dialysis. Assessment & Plan   Dyspnea on Exertion/Acute systolic heart failure -Chest x-ray did show cardiomegaly with mild central vascular congestion, development of small bilateral pleural effusions -Suspect dyspnea will improve with dialysis -BNP 1725 -Question whether patient will need further dialysis treatments  ESRD -Patient dialyzes Monday, Wednesday, Friday -Spoke with nephrology, patient has been coming off of his dialysis sessions earlier due to leg cramping  Mildly elevated troponin -Upon reviewing chart, has been chronically elevated. -No complaints of chest pain, possibly secondary to demand ischemia in the setting of an end-stage renal disease patient  Diabetes mellitus, type II -Will place on sliding insulin scale was CBG monitoring  Chronic atrial fibrillation -Will restart Coumadin per pharmacy -CHADSVASC at least 5  Essential hypertension -Will restart metoprolol  LLE edema/pain -Patient is on coumadin, unlikely to be DVT -Also had LE doppler on 07/06/2016: neg for DVT -Will speak to Vascular  surgery  CODE STATUS -Discussed CODE STATUS with patient, he does endorse that he would like to be a DO NOT RESUSCITATE.  DVT Prophylaxis  Coumadin  Code Status: DNR  Family Communication: None at bedside  Disposition Plan: Admitted inpatient. D/c pending improvement in breathing.   Consultants Nephrology Vascular  Procedures  None  Antibiotics   Anti-infectives    None      Subjective:   [name] [name] seen and examined today in dialysis.  Denies chest pain, abdominal pain, dizziness, headche.   Feels left leg cramping and swelling.  Feels breathing has mildly improved, but not back at baseline.  Has been feeling "bad" for 2 weeks.   Objective:   Vitals:   07/14/16 1030 07/14/16 1100 07/14/16 1130 07/14/16 1155  BP: (!) 116/54 (!) 126/58 118/60 (!) 119/50  Pulse: 76 76 78 72  Resp: (!) 28 (!) 24 (!) 22 20  Temp:    98 F (36.7 C)  TempSrc:    Oral  SpO2: 96% 96% 98% 98%  Weight:    81 kg (178 lb 9.2 oz)  Height:        Intake/Output Summary (Last 24 hours) at 07/14/16 1254 Last data filed at 07/14/16 1155  Gross per 24 hour  Intake              120 ml  Output             3700 ml  Net            -3580 ml   Filed Weights   07/14/16  0424 07/14/16 0755 07/14/16 1155  Weight: 86 kg (189 lb 8 oz) 84.9 kg (187 lb 2.7 oz) 81 kg (178 lb 9.2 oz)    Exam  General: Well developed, well nourished, NAD, appears stated age  HEENT: NCAT, mucous membranes moist.   Cardiovascular: S1 S2 auscultated, irregular  Respiratory: Diminished but clear  Abdomen: Soft, nontender, nondistended, + bowel sounds  Extremities: warm dry without cyanosis clubbing. +LLE edema, left thigh- incision clean, no erythema noted.   Neuro: AAOx3, nonfocal  Psych: Normal affect and demeanor with intact judgement and insight   Data Reviewed: I have personally reviewed following labs and imaging studies  CBC:  Recent Labs Lab 07/13/16 1958 07/14/16 0535  WBC 7.5 9.9  NEUTROABS  5.6  --   HGB 9.2* 9.6*  HCT 28.6* 30.3*  MCV 103.6* 104.5*  PLT 134* 0000000   Basic Metabolic Panel:  Recent Labs Lab 07/13/16 1958 07/14/16 0535  NA 139 138  K 4.3 4.8  CL 95* 99*  CO2 26 22  GLUCOSE 237* 224*  BUN 60* 68*  CREATININE 6.75* 7.17*  CALCIUM 9.2 9.1   GFR: Estimated Creatinine Clearance: 8.6 mL/min (by C-G formula based on SCr of 7.17 mg/dL (H)). Liver Function Tests:  Recent Labs Lab 07/13/16 1958  AST 23  ALT 20  ALKPHOS 38  BILITOT 0.7  PROT 6.2*  ALBUMIN 3.3*   No results for input(s): LIPASE, AMYLASE in the last 168 hours. No results for input(s): AMMONIA in the last 168 hours. Coagulation Profile:  Recent Labs Lab 07/14/16 0535  INR 2.41   Cardiac Enzymes:  Recent Labs Lab 07/13/16 2352 07/14/16 0535  TROPONINI 0.08* 0.08*   BNP (last 3 results) No results for input(s): PROBNP in the last 8760 hours. HbA1C: No results for input(s): HGBA1C in the last 72 hours. CBG:  Recent Labs Lab 07/14/16 0654 07/14/16 1246  GLUCAP 202* 158*   Lipid Profile: No results for input(s): CHOL, HDL, LDLCALC, TRIG, CHOLHDL, LDLDIRECT in the last 72 hours. Thyroid Function Tests: No results for input(s): TSH, T4TOTAL, FREET4, T3FREE, THYROIDAB in the last 72 hours. Anemia Panel: No results for input(s): VITAMINB12, FOLATE, FERRITIN, TIBC, IRON, RETICCTPCT in the last 72 hours. Urine analysis:    Component Value Date/Time   COLORURINE YELLOW 02/16/2014 2229   APPEARANCEUR CLEAR 02/16/2014 2229   LABSPEC 1.017 02/16/2014 2229   PHURINE 5.5 02/16/2014 2229   GLUCOSEU 100 (A) 02/16/2014 2229   HGBUR NEGATIVE 02/16/2014 2229   HGBUR 1+ 08/25/2007 0809   BILIRUBINUR NEGATIVE 02/16/2014 2229   BILIRUBINUR small 08/08/2013 1132   KETONESUR NEGATIVE 02/16/2014 2229   PROTEINUR 100 (A) 02/16/2014 2229   UROBILINOGEN 0.2 02/16/2014 2229   NITRITE NEGATIVE 02/16/2014 2229   LEUKOCYTESUR NEGATIVE 02/16/2014 2229   Sepsis  Labs: @LABRCNTIP (procalcitonin:4,lacticidven:4)  )No results found for this or any previous visit (from the past 240 hour(s)).    Radiology Studies: Dg Chest 2 View  Result Date: 07/13/2016 CLINICAL DATA:  Cough x2 weeks EXAM: CHEST  2 VIEW COMPARISON:  07/06/2016 FINDINGS: AP and lateral views of the chest demonstrate right-sided central venous catheter tip overlying the right atrium. A left-sided ICD device is similar in position. There are small bilateral effusions, increased compared to prior. Mild hazy bibasilar atelectasis or infiltrates. Slight interval enlargement of the cardiomediastinal silhouette with mild central congestion. Atherosclerosis of the aorta. No pneumothorax. IMPRESSION: 1. Cardiomegaly with mild central vascular congestion. 2. Development of small bilateral pleural effusions. Hazy atelectasis, infiltrate or edema  within the lung bases. 3. Atherosclerotic vascular disease of the aorta. Electronically Signed   By: Donavan Foil M.D.   On: 07/13/2016 20:28     Scheduled Meds: . calcium carbonate  750 mg Oral TID WC  . insulin aspart  0-9 Units Subcutaneous TID WC  . multivitamin  1 tablet Oral QHS  . sodium chloride flush  3 mL Intravenous Q12H  . sodium chloride flush  3 mL Intravenous Q12H   Continuous Infusions:    LOS: 0 days   Time Spent in minutes   30 minutes  Shandrika Ambers D.O. on 07/14/2016 at 12:54 PM  Between 7am to 7pm - Pager - [phone]  After 7pm go to [email or web address] - password [email or web address]  And look for the night coverage person covering for me after hours  [facility] Office  [phone]

## 2016-07-14 NOTE — Progress Notes (Signed)
Patient ID: [name] Mekel[place]., male   DOB: 1930/10/28, 82 y.o.   MRN: [id number] Nephrology      Will not see formally unless changed to inpatient status.  Please let us know or will plan next HD at his outpatient center.

## 2016-07-14 NOTE — Progress Notes (Signed)
ANTICOAGULATION CONSULT NOTE - Initial Consult  Pharmacy Consult for warfarin Indication: atrial fibrillation  Allergies  Allergen Reactions  . Ace Inhibitors Other (See Comments)    Stopped by Dr. Burt Knack due to progressive renal failure  . Prednisone Other (See Comments)    Raised blood sugar to almost 900    Patient Measurements: Height: 6' (182.9 cm) Weight: 178 lb 9.2 oz (81 kg) IBW/kg (Calculated) : 77.6  Vital Signs: Temp: 98 F (36.7 C) (10/23 1155) Temp Source: Oral (10/23 1155) BP: 119/50 (10/23 1155) Pulse Rate: 72 (10/23 1155)  Labs:  Recent Labs  07/13/16 1958 07/13/16 2352 07/14/16 0535 07/14/16 1248  HGB 9.2*  --  9.6*  --   HCT 28.6*  --  30.3*  --   PLT 134*  --  160  --   LABPROT  --   --  26.7*  --   INR  --   --  2.41  --   CREATININE 6.75*  --  7.17*  --   TROPONINI  --  0.08* 0.08* 0.13*    Estimated Creatinine Clearance: 8.6 mL/min (by C-G formula based on SCr of 7.17 mg/dL (H)).   Medical History: Past Medical History:  Diagnosis Date  . CARDIOMYOPATHY, PRIMARY, DILATED    a. Mixed ICM/NICM - EF 15% by echo 05/2009; St. Jude CRT-D implanted 06/2009. b. Echo 04/2013 - EF 20%, mild LVH.  [name] Chronic atrial fibrillation ([place])    a. Historically difficult rate control. b. s/p CRT-D implantation with anticipation of AV junction ablation but patient then was able to accomplish rate control and ablation not undertaken.; AVN ablation 08/2013 by Dr Lovena Le  . Chronic systolic CHF (congestive heart failure) ([place])   . Colon cancer ([place])   . CORONARY ARTERY DISEASE    a. BMS to LAD 04/2008. b. Cath 06/2009: nonobstructive disease. c. cath 05/16/2015 DES to prox LAD  . Diabetes mellitus   . Dyspnea    with exertion  . ESRD (end stage renal disease) ([place])    M_W_F hemodialysis  . History of blood transfusion    after colon surgery  . Hyperlipidemia   . Hypertension   . Osteoarthritis   . Pneumonia 2014ish  . Presence of permanent cardiac pacemaker    . Prostate cancer [facility])    a. s/p radical prostatectomy.  . Pulmonary HTN    a. PA pressure 37mmHg by echo 05/01/13.  [name] Secondary hyperparathyroidism ([place])   . Skin cancer   . SLEEP APNEA    wears CPAP  . Tubular adenoma of colon   . Valvular heart disease    a. Echo 04/2013: mild AI, mild MR.  . Vertigo     Medications:  Prescriptions Prior to Admission  Medication Sig Dispense Refill Last Dose  . acetaminophen (TYLENOL) 500 MG tablet Take 1,000 mg by mouth every 4 (four) hours as needed for mild pain.    07/13/2016 at Unknown time  . aspirin EC 81 MG tablet Take 1 tablet (81 mg total) by mouth daily. 90 tablet 3 07/12/2016 at Unknown time  . calcium carbonate (TUMS EX) 750 MG chewable tablet Chew 1 tablet by mouth 3 (three) times daily with meals.   07/13/2016 at Unknown time  . docusate sodium (COLACE) 100 MG capsule Take 300 mg by mouth daily.   07/12/2016 at Unknown time  . HUMALOG MIX 75/25 (75-25) 100 UNIT/ML SUSP injection Inject subcutaneously 26  units two times daily with  meals 5 vial 3 07/12/2016 at  Unknown time  . hydrALAZINE (APRESOLINE) 25 MG tablet Take 25 mg by mouth See admin instructions. Take 25 mg by mouth twice daily on Sundays,tuesdays,Thursdays, and Saturdays.  Take 25 mg by mouth in the evening on Mondays,Wed,and Fridays   07/13/2016 at Unknown time  . isosorbide mononitrate (IMDUR) 30 MG 24 hr tablet Take 1 tablet by mouth  daily 90 tablet 3 07/13/2016 at Unknown time  . metoprolol succinate (TOPROL-XL) 100 MG 24 hr tablet TAKE 50 MG BY MOUTH  TWICE A DAY, WITH OR  IMMEDIATELY FOLLOWING MEALS 180 tablet 3 07/13/2016 at 0800  . multivitamin (RENA-VIT) TABS tablet Take 1 tablet by mouth daily.   07/12/2016 at Unknown time  . nitroGLYCERIN (NITROSTAT) 0.4 MG SL tablet Place 0.4 mg under the tongue every 5 (five) minutes as needed for chest pain.   unknown at unknown  . warfarin (COUMADIN) 5 MG tablet Take 0.5-1 tablets (2.5-5 mg total) by mouth See admin  instructions. Take 5 mg daily except 2.5 mg on Mondays OR as directed by anticoagulation clinic.   07/12/2016 at 0800  . digoxin (DIGOX) 0.125 MG tablet Take 125 mcg by mouth every other day (Patient not taking: Reported on 07/13/2016) 45 tablet 1 Not Taking at Unknown time  . [DISCONTINUED] oxyCODONE-acetaminophen (PERCOCET/ROXICET) 5-325 MG tablet Take 1 tablet by mouth every 6 (six) hours as needed. (Patient not taking: Reported on 07/13/2016) 6 tablet 0 Not Taking at Unknown time    Assessment: [name] Perkinsis a 82 y.o.malewith PMH of chronic afib on warfarin, ESRD on HD MWF,  and CHF. Last dose taken on 10/21. Did not get dose on 10/22. INR therapeutic this morning at 2.4, hgb low, plt wnl. PTA warfarin dose: 5 mg daily, except 2.5 mg on Mondays  Goal of Therapy:  INR 2-3 Monitor platelets by anticoagulation protocol: Yes   Plan:  Give warfarin 5 mg po x 1 Monitor daily INR, CBC, clinical course, s/sx of bleed, PO intake, DDI   Thank you for allowing [place] to participate in this patients care. Jens Som, PharmD Pager: [phone] 07/14/2016,2:06 PM

## 2016-07-14 NOTE — Consult Note (Signed)
Reason for Consult:ESRD, fluid overload  Referring Physician: Dr, Ty Hilts R Rayjon Wery. is an 82 y.o. male.  HPI: 82 yr male with ESRD from DM, on HD about 5 mon at [facility]  Now with 2 wk of progressive DOE and at rest.  Dry wgt recent ^ at [facility] due to cramping and freq s/o's.   Coughing at time to point of SOB,N.   No CP but ankle edema.  No fevers or chills, D or V.  Denies xs vol intake.  On 2 antiHTN meds.  Long term DM and HTN.   Constitutional: as above, cannot do things he would like Eyes: negative Ears, nose, mouth, throat, and face: prosthetic L eye,  dentures Respiratory: as above Cardiovascular: edema, PND. orthop Gastrointestinal: coughs to point of N Genitourinary:negative Integument/breast: bruising on Coumadin Hematologic/lymphatic: anemia Musculoskeletal:negative Neurological: negative Endocrine: negative except for bs <200 Allergic/Immunologic: negative, cough with ACE, bs ^ with Pred   Dialyzes at [facility] on MWF since 01/2016. Primary Nephrologist Lorrene Reid. EDW 83.5 kg. HD Bath 2 Ca, 2 k, Dialyzer 180NR, Heparin strd. Access HD cath since 8/17, now L groin AVG 07/01/16.  Past Medical History:  Diagnosis Date  . CARDIOMYOPATHY, PRIMARY, DILATED    a. Mixed ICM/NICM - EF 15% by echo 05/2009; St. Jude CRT-D implanted 06/2009. b. Echo 04/2013 - EF 20%, mild LVH.  [name] Chronic atrial fibrillation ([place])    a. Historically difficult rate control. b. s/p CRT-D implantation with anticipation of AV junction ablation but patient then was able to accomplish rate control and ablation not undertaken.; AVN ablation 08/2013 by Dr Lovena Le  . Chronic systolic CHF (congestive heart failure) ([place])   . Colon cancer ([place])   . CORONARY ARTERY DISEASE    a. BMS to LAD 04/2008. b. Cath 06/2009: nonobstructive disease. c. cath 05/16/2015 DES to prox LAD  . Diabetes mellitus   . Dyspnea    with exertion  . ESRD (end stage renal disease) ([place])    M_W_F hemodialysis  . History of blood transfusion     after colon surgery  . Hyperlipidemia   . Hypertension   . Osteoarthritis   . Pneumonia 2014ish  . Presence of permanent cardiac pacemaker   . Prostate cancer [facility])    a. s/p radical prostatectomy.  . Pulmonary HTN    a. PA pressure 42mHg by echo 05/01/13.  .[place]   . Skin cancer   . SLEEP APNEA    wears CPAP  . Tubular adenoma of colon   . Valvular heart disease    a. Echo 04/2013: mild AI, mild MR.  . Vertigo     Past Surgical History:  Procedure Laterality Date  . ABLATION  08/2013   AVN ablation by Dr TLovena Le . AV FISTULA PLACEMENT  03/30/2012   Procedure: ARTERIOVENOUS (AV) FISTULA CREATION;  Surgeon: CAngelia Mould MD;  [place]  Service: Vascular;  Laterality: Right;  Creation of BrachioCephalic Fistula Right arm  . AV FISTULA PLACEMENT Left 07/01/2016   Procedure: INSERTION OF ARTERIOVENOUS (AV) GORE-TEX GRAFT Left THIGH;  Surgeon: CElam Dutch MD;  [place] [facility]  Service: Vascular;  Laterality: Left;  . AV NODE ABLATION N/A 09/12/2013   Procedure: AV NODE ABLATION;  Surgeon: GEvans Lance MD;  [facility];  Service: Cardiovascular;  Laterality: N/A;  . BI-VENTRICULAR IMPLANTABLE CARDIOVERTER DEFIBRILLATOR  (CRT-D)  2010   STJ CRTD implanted by Dr KCaryl Comes . CARDIAC CATHETERIZATION N/A 05/09/2015  Procedure: Left Heart Cath and Coronary Angiography;  Surgeon: Sherren Mocha, MD;  Location: [place] CV LAB;  Service: Cardiovascular;  Laterality: N/A;  . CARDIAC CATHETERIZATION N/A 05/16/2015   Procedure: Coronary Stent Intervention;  Surgeon: Sherren Mocha, MD;  Location: [place] CV LAB;  Service: Cardiovascular;  Laterality: N/A;  . COLECTOMY    . EYE SURGERY     left eye prothesis  . FRACTURE SURGERY     collar bone, right knee replacement  . INSERT / REPLACE / REMOVE PACEMAKER    . INSERTION OF DIALYSIS CATHETER N/A 05/20/2016   Procedure: INSERTION OF DIALYSIS CATHETER;  Surgeon: Elam Dutch, MD;  Location: [place];  Service: Vascular;  Laterality: N/A;  . JOINT REPLACEMENT    . LIGATION OF ARTERIOVENOUS  FISTULA Right 05/20/2016   Procedure: LIGATION OF RIGHT ARTERIOVENOUS  FISTULA;  Surgeon: Elam Dutch, MD;  Location: [place];  Service: Vascular;  Laterality: Right;  . PACEMAKER INSERTION  2010  . PENILE PROSTHESIS PLACEMENT    . PROSTATECTOMY    . PTCA     stent placed  . removed prosthetic eye    . TOTAL KNEE ARTHROPLASTY  2008   right  . VASCULAR SURGERY  07/01/2016   INSERTION OF ARTERIOVENOUS (AV) GORE-TEX GRAFT THIGH (Left) as a surgical intervention     Family History  Problem Relation Age of Onset  . Heart disease Father   . Throat cancer Father   . Throat cancer Mother   . Heart disease Mother   . Hypertension Mother     Social History:  reports that he quit smoking about 49 years ago. His smoking use included Cigarettes. He has never used smokeless tobacco. He reports that he drinks alcohol. He reports that he does not use drugs.  Allergies:  Allergies  Allergen Reactions  . Ace Inhibitors Other (See Comments)    Stopped by Dr. Burt Knack due to progressive renal failure  . Prednisone Other (See Comments)    Raised blood sugar to almost 900    Medications:  I have reviewed the patient's current medications. Prior to Admission:  Prescriptions Prior to Admission  Medication Sig Dispense Refill Last Dose  . acetaminophen (TYLENOL) 500 MG tablet Take 1,000 mg by mouth every 4 (four) hours as needed for mild pain.    07/13/2016 at Unknown time  . aspirin EC 81 MG tablet Take 1 tablet (81 mg total) by mouth daily. 90 tablet 3 07/12/2016 at Unknown time  . calcium carbonate (TUMS EX) 750 MG chewable tablet Chew 1 tablet by mouth 3 (three) times daily with meals.   07/13/2016 at Unknown time  . digoxin (DIGOX) 0.125 MG tablet Take 125 mcg by mouth every other day 45 tablet 1 07/12/2016 at Unknown  . docusate sodium (COLACE) 100 MG capsule Take 300 mg  by mouth daily.   07/12/2016 at Unknown time  . HUMALOG MIX 75/25 (75-25) 100 UNIT/ML SUSP injection Inject subcutaneously 26  units two times daily with  meals 5 vial 3 07/12/2016 at Unknown time  . hydrALAZINE (APRESOLINE) 25 MG tablet Take 25 mg by mouth See admin instructions. Take 25 mg by mouth twice daily on Sundays,tuesdays,Thursdays, and Saturdays.  Take 25 mg by mouth in the evening on Mondays,Wed,and Fridays   07/12/2016 at Unknown time  . isosorbide mononitrate (IMDUR) 30 MG 24 hr tablet Take 1 tablet by mouth  daily (Patient taking differently: Take 30 mg by mouth daily. ) 90 tablet 3 07/13/2016  at Unknown time  . metoprolol succinate (TOPROL-XL) 100 MG 24 hr tablet TAKE 50 MG BY MOUTH  TWICE A DAY, WITH OR  IMMEDIATELY FOLLOWING MEALS (Patient taking differently: Take 50 mg by mouth See admin instructions. Take 50 mg by mouth twice daily on Sunday, Tuesday, Thursday and Saturday. Take 50 mg by mouth Monday, Wednesday and Friday.) 180 tablet 3 07/13/2016 at 0800  . multivitamin (RENA-VIT) TABS tablet Take 1 tablet by mouth every evening.    07/12/2016 at Unknown time  . nitroGLYCERIN (NITROSTAT) 0.4 MG SL tablet Place 0.4 mg under the tongue every 5 (five) minutes as needed for chest pain.   unknown at unknown  . warfarin (COUMADIN) 5 MG tablet Take 0.5-1 tablets (2.5-5 mg total) by mouth See admin instructions. Take 5 mg daily except 2.5 mg on Mondays OR as directed by anticoagulation clinic. (Patient taking differently: Take 2.5-5 mg by mouth See admin instructions. Take 5 mg by mouth daily except 2.5 mg on Mondays)   07/12/2016 at 0800  . [DISCONTINUED] oxyCODONE-acetaminophen (PERCOCET/ROXICET) 5-325 MG tablet Take 1 tablet by mouth every 6 (six) hours as needed. (Patient not taking: Reported on 07/13/2016) 6 tablet 0 Not Taking at Unknown time    Hectorol 46mg tiw,. Micera 25 mcg iv q 2 wk , Venofer 530miv q wk  Results for orders placed or performed during the hospital encounter of  07/13/16 (from the past 48 hour(s))  I[facility]Stat Troponin[facility]    Status: None   Collection Time: 07/13/16  7:40 PM  Result Value Ref Range   Troponin i, poc 0.08 0.00 - 0.08 ng/mL   Comment 3            Comment: Due to the release kinetics of cTnI, a negative result within the first hours of the onset of symptoms does not rule out myocardial infarction with certainty. If myocardial infarction is still suspected, repeat the test at appropriate intervals.   CBC with Differential     Status: Abnormal   Collection Time: 07/13/16  7:58 PM  Result Value Ref Range   WBC 7.5 4.0 - 10.5 K/uL   RBC 2.76 (L) 4.22 - 5.81 MIL/uL   Hemoglobin 9.2 (L) 13.0 - 17.0 g/dL   HCT 28.6 (L) 39.0 - 52.0 %   MCV 103.6 (H) 78.0 - 100.0 fL   MCH 33.3 26.0 - 34.0 pg   MCHC 32.2 30.0 - 36.0 g/dL   RDW 15.5 11.5 - 15.5 %   Platelets 134 (L) 150 - 400 K/uL   Neutrophils Relative % 74 %   Neutro Abs 5.6 1.7 - 7.7 K/uL   Lymphocytes Relative 16 %   Lymphs Abs 1.2 0.7 - 4.0 K/uL   Monocytes Relative 7 %   Monocytes Absolute 0.5 0.1 - 1.0 K/uL   Eosinophils Relative 3 %   Eosinophils Absolute 0.2 0.0 - 0.7 K/uL   Basophils Relative 0 %   Basophils Absolute 0.0 0.0 - 0.1 K/uL  Comprehensive metabolic panel     Status: Abnormal   Collection Time: 07/13/16  7:58 PM  Result Value Ref Range   Sodium 139 135 - 145 mmol/L   Potassium 4.3 3.5 - 5.1 mmol/L   Chloride 95 (L) 101 - 111 mmol/L   CO2 26 22 - 32 mmol/L   Glucose, Bld 237 (H) 65 - 99 mg/dL   BUN 60 (H) 6 - 20 mg/dL   Creatinine, Ser 6.75 (H) 0.61 - 1.24 mg/dL  Calcium 9.2 8.9 - 10.3 mg/dL   Total Protein 6.2 (L) 6.5 - 8.1 g/dL   Albumin 3.3 (L) 3.5 - 5.0 g/dL   AST 23 15 - 41 U/L   ALT 20 17 - 63 U/L   Alkaline Phosphatase 38 38 - 126 U/L   Total Bilirubin 0.7 0.3 - 1.2 mg/dL   GFR calc non Af Amer 7 (L) >60 mL/min   GFR calc Af Amer 8 (L) >60 mL/min    Comment: (NOTE) The eGFR has been calculated using the CKD EPI equation. This  calculation has not been validated in all clinical situations. eGFR's persistently <60 mL/min signify possible Chronic Kidney Disease.    Anion gap 18 (H) 5 - 15  Brain natriuretic peptide     Status: Abnormal   Collection Time: 07/13/16  8:00 PM  Result Value Ref Range   B Natriuretic Peptide 1,725.3 (H) 0.0 - 100.0 pg/mL  Troponin I     Status: Abnormal   Collection Time: 07/13/16 11:52 PM  Result Value Ref Range   Troponin I 0.08 (HH) <0.03 ng/mL    Comment: CRITICAL RESULT CALLED TO, READ BACK BY AND VERIFIED WITH: YOCUM,R RN 07/14/2016 0058 JORDANS   Troponin I     Status: Abnormal   Collection Time: 07/14/16  5:35 AM  Result Value Ref Range   Troponin I 0.08 (HH) <0.03 ng/mL    Comment: CRITICAL VALUE NOTED.  VALUE IS CONSISTENT WITH PREVIOUSLY REPORTED AND CALLED VALUE.  Basic metabolic panel     Status: Abnormal   Collection Time: 07/14/16  5:35 AM  Result Value Ref Range   Sodium 138 135 - 145 mmol/L   Potassium 4.8 3.5 - 5.1 mmol/L   Chloride 99 (L) 101 - 111 mmol/L   CO2 22 22 - 32 mmol/L   Glucose, Bld 224 (H) 65 - 99 mg/dL   BUN 68 (H) 6 - 20 mg/dL   Creatinine, Ser 7.17 (H) 0.61 - 1.24 mg/dL   Calcium 9.1 8.9 - 10.3 mg/dL   GFR calc non Af Amer 6 (L) >60 mL/min   GFR calc Af Amer 7 (L) >60 mL/min    Comment: (NOTE) The eGFR has been calculated using the CKD EPI equation. This calculation has not been validated in all clinical situations. eGFR's persistently <60 mL/min signify possible Chronic Kidney Disease.    Anion gap 17 (H) 5 - 15  CBC     Status: Abnormal   Collection Time: 07/14/16  5:35 AM  Result Value Ref Range   WBC 9.9 4.0 - 10.5 K/uL   RBC 2.90 (L) 4.22 - 5.81 MIL/uL   Hemoglobin 9.6 (L) 13.0 - 17.0 g/dL   HCT 30.3 (L) 39.0 - 52.0 %   MCV 104.5 (H) 78.0 - 100.0 fL   MCH 33.1 26.0 - 34.0 pg   MCHC 31.7 30.0 - 36.0 g/dL   RDW 15.8 (H) 11.5 - 15.5 %   Platelets 160 150 - 400 K/uL  Protime-INR     Status: Abnormal   Collection Time: 07/14/16   5:35 AM  Result Value Ref Range   Prothrombin Time 26.7 (H) 11.4 - 15.2 seconds   INR 2.41   Glucose, capillary     Status: Abnormal   Collection Time: 07/14/16  6:54 AM  Result Value Ref Range   Glucose-Capillary 202 (H) 65 - 99 mg/dL  Glucose, capillary     Status: Abnormal   Collection Time: 07/14/16 12:46 PM  Result Value Ref Range  Glucose-Capillary 158 (H) 65 - 99 mg/dL  Troponin I     Status: Abnormal   Collection Time: 07/14/16 12:48 PM  Result Value Ref Range   Troponin I 0.13 (HH) <0.03 ng/mL    Comment: CRITICAL VALUE NOTED.  VALUE IS CONSISTENT WITH PREVIOUSLY REPORTED AND CALLED VALUE.   *Note: Due to a large number of results and/or encounters for the requested time period, some results have not been displayed. A complete set of results can be found in Results Review.    Dg Chest 2 View  Result Date: 07/13/2016 CLINICAL DATA:  Cough x2 weeks EXAM: CHEST  2 VIEW COMPARISON:  07/06/2016 FINDINGS: AP and lateral views of the chest demonstrate right-sided central venous catheter tip overlying the right atrium. A left-sided ICD device is similar in position. There are small bilateral effusions, increased compared to prior. Mild hazy bibasilar atelectasis or infiltrates. Slight interval enlargement of the cardiomediastinal silhouette with mild central congestion. Atherosclerosis of the aorta. No pneumothorax. IMPRESSION: 1. Cardiomegaly with mild central vascular congestion. 2. Development of small bilateral pleural effusions. Hazy atelectasis, infiltrate or edema within the lung bases. 3. Atherosclerotic vascular disease of the aorta. Electronically Signed   By: Donavan Foil M.D.   On: 07/13/2016 20:28    ROS Blood pressure (!) 119/50, pulse 72, temperature 98 F (36.7 C), temperature source Oral, resp. rate 20, height 6' (1.829 m), weight 81 kg (178 lb 9.2 oz), SpO2 98 %. Physical Exam Physical Examination: General appearance - alert, well appearing, and in no distress and  pale Mental status - alert, oriented to person, place, and time Eyes - prosthetic eye L , DM retinal dz on R Mouth - edentulous Neck - adenopathy noted PCL Lymphatics - posterior cervical nodes Chest - rales noted bibasilar, decreased air entry noted bilat Heart - Pacer L [place].  Gr 2/6 holosys M, LLSB,  PMI 13 cm lat to MSL, no L, H, T Abdomen - liver down 5 cm, pos bs, soft Extremities - pedal edema 1 +, decreased DP, tied off RUA AVF, New L Groin AVG,. B&T Skin - pale, sallow, bruises  Assessment/Plan: 1 Fluid xs. Needs much lower dry.  Got to 81 kg today and will try lower.  Need to lower meds. 2 ESRD: as above.  PC access, Groin 2 wk old 3 Hypertension: not an issue,needs less meds 4. Anemia of ESRD: ok. Will use ESA 5. Metabolic Bone Disease: will cont Hectoral  6 DM per primary 7 atrial dysrhythmias 9 Pacer 10 DJD P Hd, lower dry, lower bp med, give ESA, vit D  Candie Gintz L 07/14/2016, 2:03 PM

## 2016-07-15 DIAGNOSIS — I5023 Acute on chronic systolic (congestive) heart failure: Secondary | ICD-10-CM

## 2016-07-15 LAB — CUP PACEART REMOTE DEVICE CHECK
Battery Remaining Longevity: 6 mo
Battery Remaining Percentage: 9 %
Date Time Interrogation Session: 20170925080011
HighPow Impedance: 39 Ohm
Implantable Lead Implant Date: 20101028
Implantable Lead Location: 753860
Implantable Lead Model: 4196
Lead Channel Impedance Value: 380 Ohm
Lead Channel Impedance Value: 430 Ohm
Lead Channel Pacing Threshold Amplitude: 1 V
Lead Channel Pacing Threshold Pulse Width: 0.6 ms
Lead Channel Pacing Threshold Pulse Width: 1 ms
Lead Channel Setting Pacing Amplitude: 2.5 V
Lead Channel Setting Pacing Pulse Width: 0.6 ms
Lead Channel Setting Sensing Sensitivity: 0.5 mV
MDC IDC LEAD IMPLANT DT: 20101028
MDC IDC LEAD LOCATION: 753858
MDC IDC LEAD MODEL: 7121
MDC IDC MSMT BATTERY VOLTAGE: 2.66 V
MDC IDC MSMT LEADCHNL RV PACING THRESHOLD AMPLITUDE: 0.75 V
MDC IDC MSMT LEADCHNL RV SENSING INTR AMPL: 12 mV
MDC IDC PG SERIAL: 595859
MDC IDC SET LEADCHNL LV PACING PULSEWIDTH: 1 ms
MDC IDC SET LEADCHNL RV PACING AMPLITUDE: 2.5 V

## 2016-07-15 LAB — BASIC METABOLIC PANEL
ANION GAP: 15 (ref 5–15)
BUN: 41 mg/dL — AB (ref 6–20)
CALCIUM: 8.9 mg/dL (ref 8.9–10.3)
CO2: 25 mmol/L (ref 22–32)
Chloride: 94 mmol/L — ABNORMAL LOW (ref 101–111)
Creatinine, Ser: 5.48 mg/dL — ABNORMAL HIGH (ref 0.61–1.24)
GFR calc Af Amer: 10 mL/min — ABNORMAL LOW (ref >60)
GFR, EST NON AFRICAN AMERICAN: 9 mL/min — AB (ref >60)
GLUCOSE: 188 mg/dL — AB (ref 65–99)
POTASSIUM: 4 mmol/L (ref 3.5–5.1)
SODIUM: 134 mmol/L — AB (ref 135–145)

## 2016-07-15 LAB — CBC
HCT: 29.1 % — ABNORMAL LOW (ref 39.0–52.0)
Hemoglobin: 9.4 g/dL — ABNORMAL LOW (ref 13.0–17.0)
MCH: 33.3 pg (ref 26.0–34.0)
MCHC: 32.3 g/dL (ref 30.0–36.0)
MCV: 103.2 fL — AB (ref 78.0–100.0)
PLATELETS: 104 10*3/uL — AB (ref 150–400)
RBC: 2.82 MIL/uL — AB (ref 4.22–5.81)
RDW: 15.5 % (ref 11.5–15.5)
WBC: 7.3 10*3/uL (ref 4.0–10.5)

## 2016-07-15 LAB — PROTIME-INR
INR: 2.42
PROTHROMBIN TIME: 26.8 s — AB (ref 11.4–15.2)

## 2016-07-15 LAB — GLUCOSE, CAPILLARY
Glucose-Capillary: 223 mg/dL — ABNORMAL HIGH (ref 65–99)
Glucose-Capillary: 147 mg/dL — ABNORMAL HIGH (ref 65–99)
Glucose-Capillary: 227 mg/dL — ABNORMAL HIGH (ref 65–99)

## 2016-07-15 MED ORDER — HEPARIN SODIUM (PORCINE) 1000 UNIT/ML DIALYSIS: 1000.0000 [IU] | INTRAMUSCULAR | Status: DC | PRN

## 2016-07-15 MED ORDER — PENTAFLUOROPROP-TETRAFLUOROETH EX AERO: 1.0000 "application " | INHALATION_SPRAY | CUTANEOUS | Status: DC | PRN

## 2016-07-15 MED ORDER — ALTEPLASE 2 MG IJ SOLR: 2.0000 mg | Freq: Once | INTRAMUSCULAR | Status: DC | PRN

## 2016-07-15 MED ORDER — LIDOCAINE HCL (PF) 1 % IJ SOLN: 5.0000 mL | INTRAMUSCULAR | Status: DC | PRN

## 2016-07-15 MED ORDER — LIDOCAINE-PRILOCAINE 2.5-2.5 % EX CREA: 1.0000 "application " | TOPICAL_CREAM | CUTANEOUS | Status: DC | PRN

## 2016-07-15 MED ORDER — METOPROLOL SUCCINATE ER 25 MG PO TB24: 12.5000 mg | ORAL_TABLET | Freq: Every day | ORAL | 0 refills | Status: AC

## 2016-07-15 MED ORDER — SODIUM CHLORIDE 0.9 % IV SOLN: 100.0000 mL | INTRAVENOUS | Status: DC | PRN

## 2016-07-15 MED ORDER — WARFARIN SODIUM 5 MG PO TABS: 5.0000 mg | ORAL_TABLET | ORAL | Status: DC

## 2016-07-15 MED ORDER — HEPARIN SODIUM (PORCINE) 1000 UNIT/ML DIALYSIS
100.0000 [IU]/kg | INTRAMUSCULAR | Status: DC | PRN
  Filled 2016-07-15: qty 9

## 2016-07-15 MED ORDER — DARBEPOETIN ALFA 100 MCG/0.5ML IJ SOSY
PREFILLED_SYRINGE | INTRAMUSCULAR | Status: AC
  Filled 2016-07-15: qty 0.5

## 2016-07-15 MED ORDER — METOPROLOL SUCCINATE 12.5 MG HALF TABLET: 12.5000 mg | ORAL_TABLET | Freq: Every day | ORAL | Status: DC

## 2016-07-15 MED ORDER — WARFARIN SODIUM 2.5 MG PO TABS: 2.5000 mg | ORAL_TABLET | ORAL | Status: DC

## 2016-07-15 NOTE — Procedures (Signed)
I was present at this session.  I have reviewed the session itself and made appropriate changes. BP in low 100s  tol well, cath flow 400. Reset dry  Shakala Marlatt L 10/24/20178:12 AM

## 2016-07-15 NOTE — Progress Notes (Signed)
Subjective: Interval History: has no complaint of breathing better.  Objective: Vital signs in last 24 hours: Temp:  [97.8 F (36.6 C)-98.9 F (37.2 C)] 98.1 F (36.7 C) (10/24 0715) Pulse Rate:  [72-86] 75 (10/24 0731) Resp:  [18-28] 20 (10/24 0731) BP: (93-138)/(48-68) 122/54 (10/24 0726) SpO2:  [93 %-99 %] 93 % (10/24 0715) Weight:  [81 kg (178 lb 9.2 oz)-84 kg (185 lb 3 oz)] 81 kg (178 lb 9.2 oz) (10/24 0726) Weight change: -1.057 kg (-2 lb 5.3 oz)  Intake/Output from previous day: 10/23 0701 - 10/24 0700 In: 360 [P.O.:360] Out: 3750 [Urine:150] Intake/Output this shift: No intake/output data recorded.  General appearance: alert, cooperative, no distress and pale Resp: diminished breath sounds bilaterally and rales bibasilar Chest wall: RIJ cath , L [place] pacer Cardio: S1, S2 normal and systolic murmur: holosystolic 2/6, blowing at lower left sternal border GI: soft, pos bs, liver down 5 cm Extremities: edema 1-2+ and L groin AVG  Lab Results:  Recent Labs  07/14/16 0535 07/15/16 0317  WBC 9.9 7.3  HGB 9.6* 9.4*  HCT 30.3* 29.1*  PLT 160 104*   BMET:  Recent Labs  07/14/16 0535 07/15/16 0317  NA 138 134*  K 4.8 4.0  CL 99* 94*  CO2 22 25  GLUCOSE 224* 188*  BUN 68* 41*  CREATININE 7.17* 5.48*  CALCIUM 9.1 8.9   No results for input(s): PTH in the last 72 hours. Iron Studies: No results for input(s): IRON, TIBC, TRANSFERRIN, FERRITIN in the last 72 hours.  Studies/Results: Dg Chest 2 View  Result Date: 07/13/2016 CLINICAL DATA:  Cough x2 weeks EXAM: CHEST  2 VIEW COMPARISON:  07/06/2016 FINDINGS: AP and lateral views of the chest demonstrate right-sided central venous catheter tip overlying the right atrium. A left-sided ICD device is similar in position. There are small bilateral effusions, increased compared to prior. Mild hazy bibasilar atelectasis or infiltrates. Slight interval enlargement of the cardiomediastinal silhouette with mild central  congestion. Atherosclerosis of the aorta. No pneumothorax. IMPRESSION: 1. Cardiomegaly with mild central vascular congestion. 2. Development of small bilateral pleural effusions. Hazy atelectasis, infiltrate or edema within the lung bases. 3. Atherosclerotic vascular disease of the aorta. Electronically Signed   By: Donavan Foil M.D.   On: 07/13/2016 20:28    I have reviewed the patient's current medications.  Assessment/Plan: 1 ESRD vol xs improving . Extra tx , reset Dry 2 HTN needs lower to no meds. Will just use 12.5mg  Toprol hs.  We are fighting low bps 3 Anemia on esa 4 Dm controlled 5 Atrial dysrhythmias on Dig. Pacer, may need Amio if worse 6 HPTH vit D P HD, determine dry, lower Toprol    LOS: 1 day   Torryn Hudspeth L 07/15/2016,8:13 AM

## 2016-07-15 NOTE — Progress Notes (Signed)
Pt has orders to be discharged. Discharge instructions given and pt has no additional questions at this time. Medication regimen reviewed and pt educated. Pt verbalized understanding and has no additional questions. Telemetry box removed. IV removed and site in good condition. Pt stable and waiting for transportation.   Eshika Reckart RN 

## 2016-07-15 NOTE — Discharge Instructions (Signed)
Heart Failure °Heart failure is a condition in which the heart has trouble pumping blood. This means your heart does not pump blood efficiently for your body to work well. In some cases of heart failure, fluid may back up into your lungs or you may have swelling (edema) in your lower legs. Heart failure is usually a long-term (chronic) condition. It is important for you to take good care of yourself and follow your health care provider's treatment plan. °CAUSES  °Some health conditions can cause heart failure. Those health conditions include: °· High blood pressure (hypertension). Hypertension causes the heart muscle to work harder than normal. When pressure in the blood vessels is high, the heart needs to pump (contract) with more force in order to circulate blood throughout the body. High blood pressure eventually causes the heart to become stiff and weak. °· Coronary artery disease (CAD). CAD is the buildup of cholesterol and fat (plaque) in the arteries of the heart. The blockage in the arteries deprives the heart muscle of oxygen and blood. This can cause chest pain and may lead to a heart attack. High blood pressure can also contribute to CAD. °· Heart attack (myocardial infarction). A heart attack occurs when one or more arteries in the heart become blocked. The loss of oxygen damages the muscle tissue of the heart. When this happens, part of the heart muscle dies. The injured tissue does not contract as well and weakens the heart's ability to pump blood. °· Abnormal heart valves. When the heart valves do not open and close properly, it can cause heart failure. This makes the heart muscle pump harder to keep the blood flowing. °· Heart muscle disease (cardiomyopathy or myocarditis). Heart muscle disease is damage to the heart muscle from a variety of causes. These can include drug or alcohol abuse, infections, or unknown reasons. These can increase the risk of heart failure. °· Lung disease. Lung disease  makes the heart work harder because the lungs do not work properly. This can cause a strain on the heart, leading it to fail. °· Diabetes. Diabetes increases the risk of heart failure. High blood sugar contributes to high fat (lipid) levels in the blood. Diabetes can also cause slow damage to tiny blood vessels that carry important nutrients to the heart muscle. When the heart does not get enough oxygen and food, it can cause the heart to become weak and stiff. This leads to a heart that does not contract efficiently. °· Other conditions can contribute to heart failure. These include abnormal heart rhythms, thyroid problems, and low blood counts (anemia). °Certain unhealthy behaviors can increase the risk of heart failure, including: °· Being overweight. °· Smoking or chewing tobacco. °· Eating foods high in fat and cholesterol. °· Abusing illicit drugs or alcohol. °· Lacking physical activity. °SYMPTOMS  °Heart failure symptoms may vary and can be hard to detect. Symptoms may include: °· Shortness of breath with activity, such as climbing stairs. °· Persistent cough. °· Swelling of the feet, ankles, legs, or abdomen. °· Unexplained weight gain. °· Difficulty breathing when lying flat (orthopnea). °· Waking from sleep because of the need to sit up and get more air. °· Rapid heartbeat. °· Fatigue and loss of energy. °· Feeling light-headed, dizzy, or close to fainting. °· Loss of appetite. °· Nausea. °· Increased urination during the night (nocturia). °DIAGNOSIS  °A diagnosis of heart failure is based on your history, symptoms, physical examination, and diagnostic tests. Diagnostic tests for heart failure may include: °·   Echocardiography. °· Electrocardiography. °· Chest X-ray. °· Blood tests. °· Exercise stress test. °· Cardiac angiography. °· Radionuclide scans. °TREATMENT  °Treatment is aimed at managing the symptoms of heart failure. Medicines, behavioral changes, or surgical intervention may be necessary to  treat heart failure. °· Medicines to help treat heart failure may include: °¨ Angiotensin-converting enzyme (ACE) inhibitors. This type of medicine blocks the effects of a blood protein called angiotensin-converting enzyme. ACE inhibitors relax (dilate) the blood vessels and help lower blood pressure. °¨ Angiotensin receptor blockers (ARBs). This type of medicine blocks the actions of a blood protein called angiotensin. Angiotensin receptor blockers dilate the blood vessels and help lower blood pressure. °¨ Water pills (diuretics). Diuretics cause the kidneys to remove salt and water from the blood. The extra fluid is removed through urination. This loss of extra fluid lowers the volume of blood the heart pumps. °¨ Beta blockers. These prevent the heart from beating too fast and improve heart muscle strength. °¨ Digitalis. This increases the force of the heartbeat. °· Healthy behavior changes include: °¨ Obtaining and maintaining a healthy weight. °¨ Stopping smoking or chewing tobacco. °¨ Eating heart-healthy foods. °¨ Limiting or avoiding alcohol. °¨ Stopping illicit drug use. °¨ Physical activity as directed by your health care provider. °· Surgical treatment for heart failure may include: °¨ A procedure to open blocked arteries, repair damaged heart valves, or remove damaged heart muscle tissue. °¨ A pacemaker to improve heart muscle function and control certain abnormal heart rhythms. °¨ An internal cardioverter defibrillator to treat certain serious abnormal heart rhythms. °¨ A left ventricular assist device (LVAD) to assist the pumping ability of the heart. °HOME CARE INSTRUCTIONS  °· Take medicines only as directed by your health care provider. Medicines are important in reducing the workload of your heart, slowing the progression of heart failure, and improving your symptoms. °¨ Do not stop taking your medicine unless directed by your health care provider. °¨ Do not skip any dose of medicine. °¨ Refill your  prescriptions before you run out of medicine. Your medicines are needed every day. °· Engage in moderate physical activity if directed by your health care provider. Moderate physical activity can benefit some people. The elderly and people with severe heart failure should consult with a health care provider for physical activity recommendations. °· Eat heart-healthy foods. Food choices should be free of trans fat and low in saturated fat, cholesterol, and salt (sodium). Healthy choices include fresh or frozen fruits and vegetables, fish, lean meats, legumes, fat-free or low-fat dairy products, and whole grain or high fiber foods. Talk to a dietitian to learn more about heart-healthy foods. °· Limit sodium if directed by your health care provider. Sodium restriction may reduce symptoms of heart failure in some people. Talk to a dietitian to learn more about heart-healthy seasonings. °· Use healthy cooking methods. Healthy cooking methods include roasting, grilling, broiling, baking, poaching, steaming, or stir-frying. Talk to a dietitian to learn more about healthy cooking methods. °· Limit fluids if directed by your health care provider. Fluid restriction may reduce symptoms of heart failure in some people. °· Weigh yourself every day. Daily weights are important in the early recognition of excess fluid. You should weigh yourself every morning after you urinate and before you eat breakfast. Wear the same amount of clothing each time you weigh yourself. Record your daily weight. Provide your health care provider with your weight record. °· Monitor and record your blood pressure if directed by your health care   provider.  Check your pulse if directed by your health care provider.  Lose weight if directed by your health care provider. Weight loss may reduce symptoms of heart failure in some people.  Stop smoking or chewing tobacco. Nicotine makes your heart work harder by causing your blood vessels to constrict.  Do not use nicotine gum or patches before talking to your health care provider.  Keep all follow-up visits as directed by your health care provider. This is important.  Limit alcohol intake to no more than 1 drink per day for nonpregnant women and 2 drinks per day for men. One drink equals 12 ounces of beer, 5 ounces of wine, or 1 ounces of hard liquor. Drinking more than that is harmful to your heart. Tell your health care provider if you drink alcohol several times a week. Talk with your health care provider about whether alcohol is safe for you. If your heart has already been damaged by alcohol or you have severe heart failure, drinking alcohol should be stopped completely.  Stop illicit drug use.  Stay up-to-date with immunizations. It is especially important to prevent respiratory infections through current pneumococcal and influenza immunizations.  Manage other health conditions such as hypertension, diabetes, thyroid disease, or abnormal heart rhythms as directed by your health care provider.  Learn to manage stress.  Plan rest periods when fatigued.  Learn strategies to manage high temperatures. If the weather is extremely hot:  Avoid vigorous physical activity.  Use air conditioning or fans or seek a cooler location.  Avoid caffeine and alcohol.  Wear loose-fitting, lightweight, and light-colored clothing.  Learn strategies to manage cold temperatures. If the weather is extremely cold:  Avoid vigorous physical activity.  Layer clothes.  Wear mittens or gloves, a hat, and a scarf when going outside.  Avoid alcohol.  Obtain ongoing education and support as needed.  Participate in or seek rehabilitation as needed to maintain or improve independence and quality of life. SEEK MEDICAL CARE IF:   You have a rapid weight gain.  You have increasing shortness of breath that is unusual for you.  You are unable to participate in your usual physical activities.  You tire  easily.  You cough more than normal, especially with physical activity.  You have any or more swelling in areas such as your hands, feet, ankles, or abdomen.  You are unable to sleep because it is hard to breathe.  You feel like your heart is beating fast (palpitations).  You become dizzy or light-headed upon standing up. SEEK IMMEDIATE MEDICAL CARE IF:   You have difficulty breathing.  There is a change in mental status such as decreased alertness or difficulty with concentration.  You have a pain or discomfort in your chest.  You have an episode of fainting (syncope). MAKE SURE YOU:   Understand these instructions.  Will watch your condition.  Will get help right away if you are not doing well or get worse.   This information is not intended to replace advice given to you by your health care provider. Make sure you discuss any questions you have with your health care provider.   Document Released: 09/08/2005 Document Revised: 01/23/2015 Document Reviewed: 10/08/2012 Elsevier Interactive Patient Education [employer].   ------------------------------------------ Information on my medicine - Coumadin   (Warfarin)  This medication education was reviewed with me or my healthcare representative as part of my discharge preparation.   Why was Coumadin prescribed for you? Coumadin was prescribed for you  because you have a blood clot or a medical condition that can cause an increased risk of forming blood clots. Blood clots can cause serious health problems by blocking the flow of blood to the heart, lung, or brain. Coumadin can prevent harmful blood clots from forming. As a reminder your indication for Coumadin is:   Stroke Prevention Because Of Atrial Fibrillation  What test will check on my response to Coumadin? While on Coumadin (warfarin) you will need to have an INR test regularly to ensure that your dose is keeping you in the desired range. The INR (international  normalized ratio) number is calculated from the result of the laboratory test called prothrombin time (PT).  If an INR APPOINTMENT HAS NOT ALREADY BEEN MADE FOR YOU please schedule an appointment to have this lab work done by your health care provider within 7 days. Your INR goal is usually a number between:  2 to 3 or your provider may give you a more narrow range like 2-2.5.  Ask your health care provider during an office visit what your goal INR is.  What  do you need to  know  About  COUMADIN? Take Coumadin (warfarin) exactly as prescribed by your healthcare provider about the same time each day.  DO NOT stop taking without talking to the doctor who prescribed the medication.  Stopping without other blood clot prevention medication to take the place of Coumadin may increase your risk of developing a new clot or stroke.  Get refills before you run out.  What do you do if you miss a dose? If you miss a dose, take it as soon as you remember on the same day then continue your regularly scheduled regimen the next day.  Do not take two doses of Coumadin at the same time.  Important Safety Information A possible side effect of Coumadin (Warfarin) is an increased risk of bleeding. You should call your healthcare provider right away if you experience any of the following: ? Bleeding from an injury or your nose that does not stop. ? Unusual colored urine (red or dark brown) or unusual colored stools (red or black). ? Unusual bruising for unknown reasons. ? A serious fall or if you hit your head (even if there is no bleeding).  Some foods or medicines interact with Coumadin (warfarin) and might alter your response to warfarin. To help avoid this: ? Eat a balanced diet, maintaining a consistent amount of Vitamin K. ? Notify your provider about major diet changes you plan to make. ? Avoid alcohol or limit your intake to 1 drink for women and 2 drinks for men per day. (1 drink is 5 oz. wine, 12 oz.  beer, or 1.5 oz. liquor.)  Make sure that ANY health care provider who prescribes medication for you knows that you are taking Coumadin (warfarin).  Also make sure the healthcare provider who is monitoring your Coumadin knows when you have started a new medication including herbals and non-prescription products.  Coumadin (Warfarin)  Major Drug Interactions  Increased Warfarin Effect Decreased Warfarin Effect  Alcohol (large quantities) Antibiotics (esp. Septra/Bactrim, Flagyl, Cipro) Amiodarone (Cordarone) Aspirin (ASA) Cimetidine (Tagamet) Megestrol (Megace) NSAIDs (ibuprofen, naproxen, etc.) Piroxicam (Feldene) Propafenone (Rythmol SR) Propranolol (Inderal) Isoniazid (INH) Posaconazole (Noxafil) Barbiturates (Phenobarbital) Carbamazepine (Tegretol) Chlordiazepoxide (Librium) Cholestyramine (Questran) Griseofulvin Oral Contraceptives Rifampin Sucralfate (Carafate) Vitamin K   Coumadin (Warfarin) Major Herbal Interactions  Increased Warfarin Effect Decreased Warfarin Effect  Garlic Ginseng Ginkgo biloba Coenzyme Q10 Green tea St. Johns wort  Coumadin (Warfarin) FOOD Interactions  Eat a consistent number of servings per week of foods HIGH in Vitamin K (1 serving =  cup)  Collards (cooked, or boiled & drained) Kale (cooked, or boiled & drained) Mustard greens (cooked, or boiled & drained) Parsley *serving size only =  cup Spinach (cooked, or boiled & drained) Swiss chard (cooked, or boiled & drained) Turnip greens (cooked, or boiled & drained)  Eat a consistent number of servings per week of foods MEDIUM-HIGH in Vitamin K (1 serving = 1 cup)  Asparagus (cooked, or boiled & drained) Broccoli (cooked, boiled & drained, or raw & chopped) Brussel sprouts (cooked, or boiled & drained) *serving size only =  cup Lettuce, raw (green leaf, endive, romaine) Spinach, raw Turnip greens, raw & chopped   These websites have more information on Coumadin (warfarin):   FailFactory.se; VeganReport.com.au;

## 2016-07-15 NOTE — Progress Notes (Signed)
Inpatient Diabetes Program Recommendations  AACE/ADA: New Consensus Statement on Inpatient Glycemic Control (2015)  Target Ranges:  Prepandial:   less than 140 mg/dL      Peak postprandial:   less than 180 mg/dL (1-2 hours)      Critically ill patients:  140 - 180 mg/dL   Results for [name] [name], [name] [name] (MRN [id number]) as of 07/15/2016 12:34  Ref. Range 07/14/2016 06:54 07/14/2016 12:46 07/14/2016 16:32 07/14/2016 21:20 07/15/2016 06:10 07/15/2016 09:44 07/15/2016 12:10  Glucose-Capillary Latest Ref Range: 65 - 99 mg/dL 202 (H) 158 (H) 204 (H) 242 (H) 223 (H) 147 (H) 227 (H)   Review of Glycemic Control  Outpatient Diabetes medications: 75/25 26 units BID Current orders for Inpatient glycemic control: Novolog 0-9 units TID with meals  Inpatient Diabetes Program Recommendations: Insulin - Basal: Please consider ordering low dose basal insulin. Recommend starting with Levemir 8 units QHS (based on 79.7 kg x 0.1 units. Correction (SSI): Please consider ordering Novolog bedtime correction scale.  Thanks, Barnie Alderman, RN, MSN, CDE Diabetes Coordinator Inpatient Diabetes Program [phone] (Team Pager from 8am to 5pm)

## 2016-07-15 NOTE — Discharge Summary (Signed)
Physician Discharge Summary  [name] [name]. BF:[id number] DOB: [birth year] DOA: 07/13/2016  PCP: Nyoka Cowden, MD  Admit date: 07/13/2016 Discharge date: 07/15/2016  Time spent: 45 minutes  Recommendations for Outpatient Follow-up:  Patient will be discharged to home.  Patient will need to follow up with primary care provider within one week of discharge.  Follow up with nephrology, and continue dialysis as scheduled.  Patient should continue medications as prescribed.  Patient should follow a renal/carb modified diet with fluid restriction-123ml per day.  Discharge Diagnoses:  Dyspnea on Exertion/Acute systolic heart failure ESRD Mildly elevated troponin Diabetes mellitus, type II Chronic atrial fibrillation Essential hypertension LLE edema/pain CODE STATUS  Discharge Condition: Stable  Diet recommendation: renal/carb modified diet with fluid restriction-1222ml per day.   Filed Weights   07/15/16 0715 07/15/16 0726 07/15/16 1128  Weight: 81 kg (178 lb 9.2 oz) 81 kg (178 lb 9.2 oz) 79.7 kg (175 lb 11.3 oz)    History of present illness:  on 07/13/2016 by Dr. Derrill Kay [name] Perkinsis a 82 y.o.malewith medical history significant of ESRD, chronic afib on coumadin, CHF comes in with 3 weeks of dyspnea on exertion without chest pain. Pt says his dialysis has not been going well, for over a week his sessions have been cut short due to leg cramping and this past Friday he reports no fluid was taken off at all. He denies fevers. No pnd or orthpnea. Denies any peripheral edema. He was at [facility] last week and they told him he had fluid on his lungs that dialysis should fix. He is concerned that he is still having this sob with any activity. Pt referred for admission for need for dialysis.  Hospital Course:  Dyspnea on Exertion/Acute systolic heart failure -Chest x-ray did show cardiomegaly with mild central vascular congestion, development of  small bilateral pleural effusions -Suspect dyspnea will improve with dialysis -BNP 1725 -Patient did receive extra HD session today and has improved quickly  ESRD -Patient dialyzes Monday, Wednesday, Friday -Spoke with nephrology, patient has been coming off of his dialysis sessions earlier due to leg cramping -Extra HD session today -?low BP is leading to patient's cramping, leading to short sessions -Will decrease metoprolol 12.5mg  QHS   Mildly elevated troponin -Upon reviewing chart, has been chronically elevated. -No complaints of chest pain, possibly secondary to demand ischemia in the setting of an end-stage renal disease patient  Diabetes mellitus, type II -Will place on sliding insulin scale was CBG monitoring  Chronic atrial fibrillation -Will restart Coumadin per pharmacy -CHADSVASC at least 5  Essential hypertension -Will decrease metoprolol 12.5mg  QHS   LLE edema/pain -Patient is on coumadin, unlikely to be DVT -Also had LE doppler on 07/06/2016: neg for DVT -Spoke with Dr. Donnetta Hutching, vascular, via phone on 10/23. LLE can be experienced by some patients, recommended compression stockings if patient can tolerate.  CODE STATUS -Discussed CODE STATUS with patient, he does endorse that he would like to be a DO NOT RESUSCITATE.  Consultants Nephrology Vascular  Procedures  None   Discharge Exam: Vitals:   07/15/16 1104 07/15/16 1128  BP: (!) 124/49 (!) 129/50  Pulse: 75 87  Resp: (!) 23 (!) 22  Temp:  98 F (36.7 C)   [name] [name] seen and examined today in dialysis.  Denies chest pain, abdominal pain, dizziness, headche.  Feels breathing is back at baseline and denies current cramps.   Exam  General: Well developed, well nourished, NAD  HEENT: NCAT, mucous  membranes moist.   Cardiovascular: S1 S2 auscultated, irregular, 2/6 SEM  Respiratory: Diminished but clear  Abdomen: Soft, nontender, nondistended, + bowel sounds  Extremities: warm  dry without cyanosis clubbing. +LLE edema (improving), left thigh- incision clean, no erythema noted.   Neuro: AAOx3, nonfocal  Psych: Normal affect and demeanor with intact judgement and insight  Discharge Instructions Discharge Instructions    Discharge instructions    Complete by:  As directed    Patient will be discharged to home.  Patient will need to follow up with primary care provider within one week of discharge.  Follow up with nephrology, and continue dialysis as scheduled.  Patient should continue medications as prescribed.  Patient should follow a renal/carb modified diet with fluid restriction-1247ml per day.     Current Discharge Medication List    CONTINUE these medications which have CHANGED   Details  metoprolol succinate (TOPROL-XL) 25 MG 24 hr tablet Take 0.5 tablets (12.5 mg total) by mouth at bedtime. Qty: 30 tablet, Refills: 0      CONTINUE these medications which have NOT CHANGED   Details  acetaminophen (TYLENOL) 500 MG tablet Take 1,000 mg by mouth every 4 (four) hours as needed for mild pain.     aspirin EC 81 MG tablet Take 1 tablet (81 mg total) by mouth daily. Qty: 90 tablet, Refills: 3    calcium carbonate (TUMS EX) 750 MG chewable tablet Chew 1 tablet by mouth 3 (three) times daily with meals.    digoxin (DIGOX) 0.125 MG tablet Take 125 mcg by mouth every other day Qty: 45 tablet, Refills: 1    docusate sodium (COLACE) 100 MG capsule Take 300 mg by mouth daily.    HUMALOG MIX 75/25 (75-25) 100 UNIT/ML SUSP injection Inject subcutaneously 26  units two times daily with  meals Qty: 5 vial, Refills: 3    hydrALAZINE (APRESOLINE) 25 MG tablet Take 25 mg by mouth See admin instructions. Take 25 mg by mouth twice daily on Sundays,tuesdays,Thursdays, and Saturdays.  Take 25 mg by mouth in the evening on Mondays,Wed,and Fridays    isosorbide mononitrate (IMDUR) 30 MG 24 hr tablet Take 1 tablet by mouth  daily Qty: 90 tablet, Refills: 3      multivitamin (RENA-VIT) TABS tablet Take 1 tablet by mouth every evening.     nitroGLYCERIN (NITROSTAT) 0.4 MG SL tablet Place 0.4 mg under the tongue every 5 (five) minutes as needed for chest pain.    warfarin (COUMADIN) 5 MG tablet Take 0.5-1 tablets (2.5-5 mg total) by mouth See admin instructions. Take 5 mg daily except 2.5 mg on Mondays OR as directed by anticoagulation clinic.       Allergies  Allergen Reactions  . Ace Inhibitors Other (See Comments)    Stopped by Dr. Burt Knack due to progressive renal failure  . Prednisone Other (See Comments)    Raised blood sugar to almost 900   Follow-up Rinard, MD. Schedule an appointment as soon as possible for a visit today.   Specialty:  Internal Medicine Why:  Hospital follow up Contact information: [place] [address] [phone]            The results of significant diagnostics from this hospitalization (including imaging, microbiology, ancillary and laboratory) are listed below for reference.    Significant Diagnostic Studies: Dg Chest 2 View  Result Date: 07/13/2016 CLINICAL DATA:  Cough x2 weeks EXAM: CHEST  2 VIEW COMPARISON:  07/06/2016 FINDINGS:  AP and lateral views of the chest demonstrate right-sided central venous catheter tip overlying the right atrium. A left-sided ICD device is similar in position. There are small bilateral effusions, increased compared to prior. Mild hazy bibasilar atelectasis or infiltrates. Slight interval enlargement of the cardiomediastinal silhouette with mild central congestion. Atherosclerosis of the aorta. No pneumothorax. IMPRESSION: 1. Cardiomegaly with mild central vascular congestion. 2. Development of small bilateral pleural effusions. Hazy atelectasis, infiltrate or edema within the lung bases. 3. Atherosclerotic vascular disease of the aorta. Electronically Signed   By: Donavan Foil M.D.   On: 07/13/2016 20:28   Dg Chest 2  View  Result Date: 07/06/2016 CLINICAL DATA:  Shortness of breath. EXAM: CHEST  2 VIEW COMPARISON:  May 20, 2016 FINDINGS: The double lumen dialysis catheter is in stable position. Minimal atelectasis in the lateral right lung base. Mild cardiomegaly i an n stable AICD device. No overt edema. Minimal opacity in the medial right lung base. IMPRESSION: 1. Minimal opacity in the medial right lung base. Recommend follow-up to resolution. Electronically Signed   By: Dorise Bullion III M.D   On: 07/06/2016 10:49   US Venous Img Lower Bilateral  Result Date: 07/06/2016 CLINICAL DATA:  82 year old male with a history of left upper thigh dialysis graft. Worsening shortness of breath. EXAM: BILATERAL LOWER EXTREMITY VENOUS DOPPLER ULTRASOUND TECHNIQUE: Gray-scale sonography with graded compression, as well as color Doppler and duplex ultrasound were performed to evaluate the lower extremity deep venous systems from the level of the common femoral vein and including the common femoral, femoral, profunda femoral, popliteal and calf veins including the posterior tibial, peroneal and gastrocnemius veins when visible. The superficial great saphenous vein was also interrogated. Spectral Doppler was utilized to evaluate flow at rest and with distal augmentation maneuvers in the common femoral, femoral and popliteal veins. COMPARISON:  None. FINDINGS: RIGHT LOWER EXTREMITY Common Femoral Vein: No evidence of thrombus. Normal compressibility, respiratory phasicity and response to augmentation. Saphenofemoral Junction: No evidence of thrombus. Normal compressibility and flow on color Doppler imaging. Profunda Femoral Vein: No evidence of thrombus. Normal compressibility and flow on color Doppler imaging. Femoral Vein: No evidence of thrombus. Normal compressibility, respiratory phasicity and response to augmentation. Popliteal Vein: No evidence of thrombus. Normal compressibility, respiratory phasicity and response to  augmentation. Calf Veins: No evidence of thrombus. Normal compressibility and flow on color Doppler imaging. Superficial Great Saphenous Vein: No evidence of thrombus. Normal compressibility and flow on color Doppler imaging. Other Findings:  None. LEFT LOWER EXTREMITY Common Femoral Vein: Waveform compatible with the presence of arterial venous dialysis circuit. Saphenofemoral Junction: No evidence of thrombus. Normal compressibility and flow on color Doppler imaging. Profunda Femoral Vein: No evidence of thrombus. Normal compressibility and flow on color Doppler imaging. Femoral Vein: No evidence of thrombus. Normal compressibility, respiratory phasicity and response to augmentation. Popliteal Vein: No evidence of thrombus. Normal compressibility, respiratory phasicity and response to augmentation. Calf Veins: No evidence of thrombus. Normal compressibility and flow on color Doppler imaging. Superficial Great Saphenous Vein: No evidence of thrombus. Normal compressibility and flow on color Doppler imaging. Other Findings:  None. IMPRESSION: Sonographic survey of the bilateral lower extremities negative for DVT. Waveform of the left common femoral vein compatible with presence of arterial venous hemodialysis circuit. Signed, Dulcy Fanny. Earleen Newport, DO [facility] Radiology Electronically Signed   By: Corrie Mckusick D.O.   On: 07/06/2016 13:21    Microbiology: No results found for this or any previous  visit (from the past 240 hour(s)).   Labs: Basic Metabolic Panel:  Recent Labs Lab 07/13/16 1958 07/14/16 0535 07/15/16 0317  NA 139 138 134*  K 4.3 4.8 4.0  CL 95* 99* 94*  CO2 26 22 25   GLUCOSE 237* 224* 188*  BUN 60* 68* 41*  CREATININE 6.75* 7.17* 5.48*  CALCIUM 9.2 9.1 8.9   Liver Function Tests:  Recent Labs Lab 07/13/16 1958  AST 23  ALT 20  ALKPHOS 38  BILITOT 0.7  PROT 6.2*  ALBUMIN 3.3*   No results for input(s): LIPASE, AMYLASE in  the last 168 hours. No results for input(s): AMMONIA in the last 168 hours. CBC:  Recent Labs Lab 07/13/16 1958 07/14/16 0535 07/15/16 0317  WBC 7.5 9.9 7.3  NEUTROABS 5.6  --   --   HGB 9.2* 9.6* 9.4*  HCT 28.6* 30.3* 29.1*  MCV 103.6* 104.5* 103.2*  PLT 134* 160 104*   Cardiac Enzymes:  Recent Labs Lab 07/13/16 2352 07/14/16 0535 07/14/16 1248  TROPONINI 0.08* 0.08* 0.13*   BNP: BNP (last 3 results)  Recent Labs  12/18/15 0503 07/06/16 1100 07/13/16 2000  BNP 981.0* 1,768.5* 1,725.3*    ProBNP (last 3 results) No results for input(s): PROBNP in the last 8760 hours.  CBG:  Recent Labs Lab 07/14/16 1632 07/14/16 2120 07/15/16 0610 07/15/16 0944 07/15/16 1210  GLUCAP 204* 242* 223* 147* 227*       Signed:  Cristal Ford  [facility] 07/15/2016, 12:45 PM

## 2016-07-15 NOTE — Progress Notes (Signed)
Rich for warfarin Indication: atrial fibrillation  Allergies  Allergen Reactions  . Ace Inhibitors Other (See Comments)    Stopped by Dr. Burt Knack due to progressive renal failure  . Prednisone Other (See Comments)    Raised blood sugar to almost 900    Patient Measurements: Height: 6' (182.9 cm) Weight: 175 lb 11.3 oz (79.7 kg) (Standing weight) IBW/kg (Calculated) : 77.6  Vital Signs: Temp: 98 F (36.7 C) (10/24 1128) Temp Source: Oral (10/24 1128) BP: 129/50 (10/24 1128) Pulse Rate: 87 (10/24 1128)  Labs:  Recent Labs  07/13/16 1958 07/13/16 2352 07/14/16 0535 07/14/16 1248 07/15/16 0317  HGB 9.2*  --  9.6*  --  9.4*  HCT 28.6*  --  30.3*  --  29.1*  PLT 134*  --  160  --  104*  LABPROT  --   --  26.7*  --  26.8*  INR  --   --  2.41  --  2.42  CREATININE 6.75*  --  7.17*  --  5.48*  TROPONINI  --  0.08* 0.08* 0.13*  --     Estimated Creatinine Clearance: 11.2 mL/min (by C-G formula based on SCr of 5.48 mg/dL (H)).   Medical History: Past Medical History:  Diagnosis Date  . CARDIOMYOPATHY, PRIMARY, DILATED    a. Mixed ICM/NICM - EF 15% by echo 05/2009; [employer] CRT-D implanted 06/2009. b. Echo 04/2013 - EF 20%, mild LVH.  [name] Chronic atrial fibrillation ([place])    a. Historically difficult rate control. b. s/p CRT-D implantation with anticipation of AV junction ablation but patient then was able to accomplish rate control and ablation not undertaken.; AVN ablation 08/2013 by Dr Lovena Le  . Chronic systolic CHF (congestive heart failure) ([place])   . Colon cancer ([place])   . CORONARY ARTERY DISEASE    a. BMS to LAD 04/2008. b. Cath 06/2009: nonobstructive disease. c. cath 05/16/2015 DES to prox LAD  . Diabetes mellitus   . Dyspnea    with exertion  . ESRD (end stage renal disease) ([place])    M_W_F hemodialysis  . History of blood transfusion    after colon surgery  . Hyperlipidemia   . Hypertension   . Osteoarthritis   .  Pneumonia 2014ish  . Presence of permanent cardiac pacemaker   . Prostate cancer [facility])    a. s/p radical prostatectomy.  . Pulmonary HTN    a. PA pressure 92mmHg by echo 05/01/13.  [name] Secondary hyperparathyroidism ([place])   . Skin cancer   . SLEEP APNEA    wears CPAP  . Tubular adenoma of colon   . Valvular heart disease    a. Echo 04/2013: mild AI, mild MR.  . Vertigo     Medications:  Prescriptions Prior to Admission  Medication Sig Dispense Refill Last Dose  . acetaminophen (TYLENOL) 500 MG tablet Take 1,000 mg by mouth every 4 (four) hours as needed for mild pain.    07/13/2016 at Unknown time  . aspirin EC 81 MG tablet Take 1 tablet (81 mg total) by mouth daily. 90 tablet 3 07/12/2016 at Unknown time  . calcium carbonate (TUMS EX) 750 MG chewable tablet Chew 1 tablet by mouth 3 (three) times daily with meals.   07/13/2016 at Unknown time  . digoxin (DIGOX) 0.125 MG tablet Take 125 mcg by mouth every other day 45 tablet 1 07/12/2016 at Unknown  . docusate sodium (COLACE) 100 MG capsule Take 300 mg by mouth daily.  07/12/2016 at Unknown time  . HUMALOG MIX 75/25 (75-25) 100 UNIT/ML SUSP injection Inject subcutaneously 26  units two times daily with  meals 5 vial 3 07/12/2016 at Unknown time  . hydrALAZINE (APRESOLINE) 25 MG tablet Take 25 mg by mouth See admin instructions. Take 25 mg by mouth twice daily on Sundays,tuesdays,Thursdays, and Saturdays.  Take 25 mg by mouth in the evening on Mondays,Wed,and Fridays   07/12/2016 at Unknown time  . isosorbide mononitrate (IMDUR) 30 MG 24 hr tablet Take 1 tablet by mouth  daily (Patient taking differently: Take 30 mg by mouth daily. ) 90 tablet 3 07/13/2016 at Unknown time  . metoprolol succinate (TOPROL-XL) 100 MG 24 hr tablet TAKE 50 MG BY MOUTH  TWICE A DAY, WITH OR  IMMEDIATELY FOLLOWING MEALS (Patient taking differently: Take 50 mg by mouth See admin instructions. Take 50 mg by mouth twice daily on Sunday, Tuesday, Thursday and Saturday.  Take 50 mg by mouth Monday, Wednesday and Friday.) 180 tablet 3 07/13/2016 at 0800  . multivitamin (RENA-VIT) TABS tablet Take 1 tablet by mouth every evening.    07/12/2016 at Unknown time  . nitroGLYCERIN (NITROSTAT) 0.4 MG SL tablet Place 0.4 mg under the tongue every 5 (five) minutes as needed for chest pain.   unknown at unknown  . warfarin (COUMADIN) 5 MG tablet Take 0.5-1 tablets (2.5-5 mg total) by mouth See admin instructions. Take 5 mg daily except 2.5 mg on Mondays OR as directed by anticoagulation clinic. (Patient taking differently: Take 2.5-5 mg by mouth See admin instructions. Take 5 mg by mouth daily except 2.5 mg on Mondays)   07/12/2016 at 0800  . [DISCONTINUED] oxyCODONE-acetaminophen (PERCOCET/ROXICET) 5-325 MG tablet Take 1 tablet by mouth every 6 (six) hours as needed. (Patient not taking: Reported on 07/13/2016) 6 tablet 0 Not Taking at Unknown time    Assessment: [name] Perkinsis a 82 y.o.malewith PMH of chronic afib on warfarin, ESRD on HD MWF,  and CHF. Last dose taken on 10/21. Did not get dose on 10/22. INR therapeutic this morning at 2.42, hgb stable, plt trend down. PTA warfarin dose: 5 mg daily, except 2.5 mg on Mondays  Goal of Therapy:  INR 2-3 Monitor platelets by anticoagulation protocol: Yes   Plan:  Continue home dose warfarin 5 mg PO daily, except 2.5 mg on Mondays Monitor daily INR, CBC, clinical course, s/sx of bleed, PO intake, DDI   Thank you for allowing [place] to participate in this patients care. Jens Som, PharmD Pager: [phone] 07/15/2016,1:21 PM

## 2016-07-16 ENCOUNTER — Telehealth: Payer: Self-pay

## 2016-07-16 LAB — PROTIME-INR: INR: 2 — AB (ref <=1.1)

## 2016-07-16 NOTE — Telephone Encounter (Signed)
D/C 07/15/16 To: home Pt scheduled with DrK 07/25/16 @ 10am.    Transition Care Management Follow-up Telephone Call  How have you been since you were released from the hospital? good   Do you understand why you were in the hospital? yes   Do you understand the discharge instrcutions? yes  Items Reviewed:  Medications reviewed: yes  Allergies reviewed: yes  Dietary changes reviewed: yes  Referrals reviewed: yes   Functional Questionnaire:   Activities of Daily Living (ADLs):   He states they are independent in the following: ambulation, bathing and hygiene, feeding, continence, grooming, toileting and dressing States they require assistance with the following: none   Any transportation issues/concerns?: no   Any patient concerns? no   Confirmed importance and date/time of follow-up visits scheduled: yes   Confirmed with patient if condition begins to worsen call PCP or go to the ER.  Patient was given the Call-a-Nurse line [phone]: yes

## 2016-07-17 ENCOUNTER — Ambulatory Visit (INDEPENDENT_AMBULATORY_CARE_PROVIDER_SITE_OTHER): Payer: Medicare Other | Admitting: *Deleted

## 2016-07-17 DIAGNOSIS — Z4502 Encounter for adjustment and management of automatic implantable cardiac defibrillator: Secondary | ICD-10-CM

## 2016-07-17 NOTE — Progress Notes (Signed)
Remote ICD transmission.   

## 2016-07-18 ENCOUNTER — Ambulatory Visit (INDEPENDENT_AMBULATORY_CARE_PROVIDER_SITE_OTHER): Payer: Self-pay | Admitting: General Practice

## 2016-07-18 DIAGNOSIS — Z5181 Encounter for therapeutic drug level monitoring: Secondary | ICD-10-CM

## 2016-07-18 DIAGNOSIS — I482 Chronic atrial fibrillation, unspecified: Secondary | ICD-10-CM

## 2016-07-18 NOTE — Patient Instructions (Signed)
Pre visit review using our clinic review tool, if applicable. No additional management support is needed unless otherwise documented below in the visit note. 

## 2016-07-18 NOTE — Progress Notes (Signed)
I have reviewed and agree with the plan. 

## 2016-07-25 ENCOUNTER — Ambulatory Visit: Payer: Medicare Other | Admitting: Internal Medicine

## 2016-07-25 ENCOUNTER — Ambulatory Visit (INDEPENDENT_AMBULATORY_CARE_PROVIDER_SITE_OTHER): Payer: Medicare Other | Admitting: Internal Medicine

## 2016-07-25 ENCOUNTER — Encounter: Payer: Self-pay | Admitting: Internal Medicine

## 2016-07-25 ENCOUNTER — Encounter: Payer: Self-pay | Admitting: Cardiology

## 2016-07-25 VITALS — BP 136/60 | HR 74 | Temp 98.0°F | Resp 20 | Ht 72.0 in | Wt 183.5 lb

## 2016-07-25 DIAGNOSIS — Z794 Long term (current) use of insulin: Secondary | ICD-10-CM

## 2016-07-25 DIAGNOSIS — E119 Type 2 diabetes mellitus without complications: Secondary | ICD-10-CM

## 2016-07-25 DIAGNOSIS — I10 Essential (primary) hypertension: Secondary | ICD-10-CM

## 2016-07-25 DIAGNOSIS — I5043 Acute on chronic combined systolic (congestive) and diastolic (congestive) heart failure: Secondary | ICD-10-CM

## 2016-07-25 DIAGNOSIS — N186 End stage renal disease: Secondary | ICD-10-CM

## 2016-07-25 DIAGNOSIS — I482 Chronic atrial fibrillation, unspecified: Secondary | ICD-10-CM

## 2016-07-25 DIAGNOSIS — E0821 Diabetes mellitus due to underlying condition with diabetic nephropathy: Secondary | ICD-10-CM

## 2016-07-25 NOTE — Progress Notes (Signed)
Pre visit review using our clinic review tool, if applicable. No additional management support is needed unless otherwise documented below in the visit note. 

## 2016-07-25 NOTE — Patient Instructions (Signed)
Limit your sodium (Salt) intake   Please check your hemoglobin A1c every 3 months  Return in 3 months for follow-up   

## 2016-07-25 NOTE — Progress Notes (Signed)
Subjective:    Patient ID: [name], male    DOB: [birth year], 82 y.o.   MRN: [id number]  HPI  Admit date: 07/13/2016 Discharge date: 07/15/2016  Discharge Diagnoses:  Dyspnea on Exertion/Acute systolic heart failure ESRD Mildly elevated troponin Diabetes mellitus, type II Chronic atrial fibrillation Essential hypertension LLE edema/pain  82 year old patient who is seen today following a recent hospital discharge.  Since his discharge, he has done well without any shortness of breath.  He is on a Monday, Wednesday, Friday hemodialysis regimen His diabetes has done fairly well.  Last hemoglobin A1c 7.4.  No significant hypoglycemia.  Lowest blood sugar was 73.  Accompanied by a daughter-in-law today  Patient seen today for transitional care management.  Hospital records reviewed  Past Medical History:  Diagnosis Date  . CARDIOMYOPATHY, PRIMARY, DILATED    a. Mixed ICM/NICM - EF 15% by echo 05/2009; [employer] CRT-D implanted 06/2009. b. Echo 04/2013 - EF 20%, mild LVH.  [name] Chronic atrial fibrillation ([place])    a. Historically difficult rate control. b. s/p CRT-D implantation with anticipation of AV junction ablation but patient then was able to accomplish rate control and ablation not undertaken.; AVN ablation 08/2013 by Dr Lovena Le  . Chronic systolic CHF (congestive heart failure) ([place])   . Colon cancer ([place])   . CORONARY ARTERY DISEASE    a. BMS to LAD 04/2008. b. Cath 06/2009: nonobstructive disease. c. cath 05/16/2015 DES to prox LAD  . Diabetes mellitus   . Dyspnea    with exertion  . ESRD (end stage renal disease) ([place])    M_W_F hemodialysis  . History of blood transfusion    after colon surgery  . Hyperlipidemia   . Hypertension   . Osteoarthritis   . Pneumonia 2014ish  . Presence of permanent cardiac pacemaker   . Prostate cancer [facility])    a. s/p radical prostatectomy.  . Pulmonary HTN    a. PA pressure 74mmHg by echo 05/01/13.  [name] Secondary hyperparathyroidism  ([place])   . Skin cancer   . SLEEP APNEA    wears CPAP  . Tubular adenoma of colon   . Valvular heart disease    a. Echo 04/2013: mild AI, mild MR.  . Vertigo      Social History   Social History  . Marital status: Widowed    Spouse name: N/A  . Number of children: 4  . Years of education: N/A   Occupational History  . retired    Social History Main Topics  . Smoking status: Former Smoker    Types: Cigarettes    Quit date: 09/22/1966  . Smokeless tobacco: Never Used  . Alcohol use 0.0 - 0.6 oz/week     Comment: an occasional beer  . Drug use: No  . Sexual activity: No   Other Topics Concern  . Not on file   Social History Narrative  . No narrative on file    Past Surgical History:  Procedure Laterality Date  . ABLATION  08/2013   AVN ablation by Dr Lovena Le  . AV FISTULA PLACEMENT  03/30/2012   Procedure: ARTERIOVENOUS (AV) FISTULA CREATION;  Surgeon: Angelia Mould, MD;  Location: [place];  Service: Vascular;  Laterality: Right;  Creation of BrachioCephalic Fistula Right arm  . AV FISTULA PLACEMENT Left 07/01/2016   Procedure: INSERTION OF ARTERIOVENOUS (AV) GORE-TEX GRAFT Left THIGH;  Surgeon: Elam Dutch, MD;  Location: [place];  Service: Vascular;  Laterality: Left;  . AV  NODE ABLATION N/A 09/12/2013   Procedure: AV NODE ABLATION;  Surgeon: [name], MD;  Location: [facility] CATH LAB;  Service: Cardiovascular;  Laterality: N/A;  . BI-VENTRICULAR IMPLANTABLE CARDIOVERTER DEFIBRILLATOR  (CRT-D)  2010   STJ CRTD implanted by Dr [name]  . CARDIAC CATHETERIZATION N/A 05/09/2015   Procedure: Left Heart Cath and Coronary Angiography;  Surgeon: Sherren Mocha, MD;  Location: [place] CV LAB;  Service: Cardiovascular;  Laterality: N/A;  . CARDIAC CATHETERIZATION N/A 05/16/2015   Procedure: Coronary Stent Intervention;  Surgeon: Sherren Mocha, MD;  Location: [place] CV LAB;  Service: Cardiovascular;  Laterality: N/A;  . COLECTOMY    . EYE SURGERY     left eye  prothesis  . FRACTURE SURGERY     collar bone, right knee replacement  . INSERT / REPLACE / REMOVE PACEMAKER    . INSERTION OF DIALYSIS CATHETER N/A 05/20/2016   Procedure: INSERTION OF DIALYSIS CATHETER;  Surgeon: Elam Dutch, MD;  Location: [place];  Service: Vascular;  Laterality: N/A;  . JOINT REPLACEMENT    . LIGATION OF ARTERIOVENOUS  FISTULA Right 05/20/2016   Procedure: LIGATION OF RIGHT ARTERIOVENOUS  FISTULA;  Surgeon: Elam Dutch, MD;  Location: [place];  Service: Vascular;  Laterality: Right;  . PACEMAKER INSERTION  2010  . PENILE PROSTHESIS PLACEMENT    . PROSTATECTOMY    . PTCA     stent placed  . removed prosthetic eye    . TOTAL KNEE ARTHROPLASTY  2008   right  . VASCULAR SURGERY  07/01/2016   INSERTION OF ARTERIOVENOUS (AV) GORE-TEX GRAFT THIGH (Left) as a surgical intervention     Family History  Problem Relation Age of Onset  . Heart disease Father   . Throat cancer Father   . Throat cancer Mother   . Heart disease Mother   . Hypertension Mother     Allergies  Allergen Reactions  . Ace Inhibitors Other (See Comments)    Stopped by Dr. Burt Knack due to progressive renal failure  . Prednisone Other (See Comments)    Raised blood sugar to almost 900    Current Outpatient Prescriptions on File Prior to Visit  Medication Sig Dispense Refill  . acetaminophen (TYLENOL) 500 MG tablet Take 1,000 mg by mouth every 4 (four) hours as needed for mild pain.     [name] aspirin EC 81 MG tablet Take 1 tablet (81 mg total) by mouth daily. 90 tablet 3  . calcium carbonate (TUMS EX) 750 MG chewable tablet Chew 1 tablet by mouth 3 (three) times daily with meals.    . digoxin (DIGOX) 0.125 MG tablet Take 125 mcg by mouth every other day 45 tablet 1  . docusate sodium (COLACE) 100 MG capsule Take 300 mg by mouth daily.    [name] HUMALOG MIX 75/25 (75-25) 100 UNIT/ML SUSP injection Inject subcutaneously 26  units two times daily with  meals 5 vial 3  . hydrALAZINE (APRESOLINE) 25 MG  tablet Take 25 mg by mouth See admin instructions. Take 25 mg by mouth twice daily on Sundays,tuesdays,Thursdays, and Saturdays.  Take 25 mg by mouth in the evening on Mondays,Wed,and Fridays    . isosorbide mononitrate (IMDUR) 30 MG 24 hr tablet Take 1 tablet by mouth  daily (Patient taking differently: Take 30 mg by mouth daily. ) 90 tablet 3  . metoprolol succinate (TOPROL-XL) 25 MG 24 hr tablet Take 0.5 tablets (12.5 mg total) by mouth at bedtime. 30 tablet 0  . multivitamin (  RENA-VIT) TABS tablet Take 1 tablet by mouth every evening.     . nitroGLYCERIN (NITROSTAT) 0.4 MG SL tablet Place 0.4 mg under the tongue every 5 (five) minutes as needed for chest pain.    [name] warfarin (COUMADIN) 5 MG tablet Take 0.5-1 tablets (2.5-5 mg total) by mouth See admin instructions. Take 5 mg daily except 2.5 mg on Mondays OR as directed by anticoagulation clinic. (Patient taking differently: Take 2.5-5 mg by mouth See admin instructions. Take 5 mg by mouth daily except 2.5 mg on Mondays)     No current facility-administered medications on file prior to visit.     BP 136/60 (BP Location: Left Arm, Patient Position: Sitting, Cuff Size: Normal)   Pulse 74   Temp 98 F (36.7 C) (Oral)   Resp 20   Ht 6' (1.829 m)   Wt 183 lb 8 oz (83.2 kg)   SpO2 97%   BMI 24.89 kg/m    Review of Systems  Constitutional: Positive for fatigue. Negative for appetite change, chills and fever.  HENT: Negative for congestion, dental problem, ear pain, hearing loss, sore throat, tinnitus, trouble swallowing and voice change.   Eyes: Negative for pain, discharge and visual disturbance.  Respiratory: Negative for cough, chest tightness, wheezing and stridor.   Cardiovascular: Negative for chest pain, palpitations and leg swelling.  Gastrointestinal: Negative for abdominal distention, abdominal pain, blood in stool, constipation, diarrhea, nausea and vomiting.  Genitourinary: Negative for difficulty urinating, discharge, flank  pain, genital sores, hematuria and urgency.  Musculoskeletal: Negative for arthralgias, back pain, gait problem, joint swelling, myalgias and neck stiffness.  Skin: Negative for rash.  Neurological: Positive for weakness. Negative for dizziness, syncope, speech difficulty, numbness and headaches.  Hematological: Negative for adenopathy. Does not bruise/bleed easily.  Psychiatric/Behavioral: Negative for behavioral problems and dysphoric mood. The patient is not nervous/anxious.        Objective:   Physical Exam  Constitutional: He is oriented to person, place, and time. He appears well-developed. No distress.  Slightly weak but in no acute distress.  Blood pressure low normal  HENT:  Head: Normocephalic.  Right Ear: External ear normal.  Left Ear: External ear normal.  Eyes: Conjunctivae and EOM are normal.  Neck: Normal range of motion.  Cardiovascular: Normal rate.   Murmur heard. AICD left upper anterior chest wall Port-A-Cath present on the right   Pulmonary/Chest: Breath sounds normal. No respiratory distress. He has no wheezes.  Abdominal: Bowel sounds are normal.  Musculoskeletal: Normal range of motion. He exhibits no edema or tenderness.  Neurological: He is alert and oriented to person, place, and time.  Psychiatric: He has a normal mood and affect. His behavior is normal.          Assessment & Plan:   Chronic systolic heart failure.  Appears well compensated presently Hypertension, well-controlled Diabetes.  Has done well on 75/25  30 units twice daily.  No change in regimen.  Recheck hemoglobin A1c 3 months End-stage renal disease.  Follow-up nephrology  Nyoka Cowden

## 2016-07-29 ENCOUNTER — Encounter: Payer: Self-pay | Admitting: Vascular Surgery

## 2016-07-29 ENCOUNTER — Telehealth: Payer: Self-pay | Admitting: Internal Medicine

## 2016-07-29 MED ORDER — GLUCOSE BLOOD VI STRP: ORAL_STRIP | 12 refills | Status: AC

## 2016-07-29 NOTE — Telephone Encounter (Signed)
Pt needs new rx test strips for  freestyle freedom lite glucometer send to [facility] on [address]

## 2016-07-29 NOTE — Telephone Encounter (Signed)
Pt called back and aware test strips called into pharmacy.

## 2016-07-29 NOTE — Telephone Encounter (Signed)
Tried to contact pt unable to leave message mailbox full. Rx sent to pharmacy.

## 2016-07-30 ENCOUNTER — Encounter: Payer: Self-pay | Admitting: Vascular Surgery

## 2016-07-30 ENCOUNTER — Ambulatory Visit (INDEPENDENT_AMBULATORY_CARE_PROVIDER_SITE_OTHER): Payer: Medicare Other | Admitting: Vascular Surgery

## 2016-07-30 ENCOUNTER — Encounter: Payer: Medicare Other | Admitting: Vascular Surgery

## 2016-07-30 VITALS — BP 123/63 | HR 75 | Temp 97.4°F | Resp 18 | Ht 72.0 in | Wt 186.2 lb

## 2016-07-30 DIAGNOSIS — N186 End stage renal disease: Secondary | ICD-10-CM

## 2016-07-30 DIAGNOSIS — Z992 Dependence on renal dialysis: Secondary | ICD-10-CM

## 2016-07-30 NOTE — Progress Notes (Signed)
Recent is an 82 year old male who returns today for postoperative follow-up after placement of a left thigh AV graft. This was placed on 07/01/2016. He complains of aching and pain in both lower extremities. Left and right are equal. He states that most of the pain occurs the next day after having cramping in both legs on dialysis. He has discussed this with his nephrologist. He does not really describe claudication or rest pain. He has had no incisional drainage or fevers.   Physical exam:  Vitals:   07/30/16 0851  BP: 123/63  Pulse: 75  Resp: 18  Temp: 97.4 F (36.3 C)  TempSrc: Oral  SpO2: 93%  Weight: 186 lb 3.2 oz (84.5 kg)  Height: 6' (1.829 m)    Extremities: No palpable pedal pulses audible bruit left thigh graft left groin and distal thigh incision well-healed no drainage no erythema  Assessment: Doing well status post left thigh graft placement. The graft should be ready to use at this point. The patient has a scheduled trip to [place] coming up. He does not wish to have the graft stuck in [place] for the first time. They will start cannulate his graft hearing [place] when he returns.  As far as the leg pain is concerned. This does not seem to be related to a vascular etiology.  He will follow-up on as-needed basis.  [name], MD [facility] Office: [phone] Pager: [phone]

## 2016-08-05 ENCOUNTER — Encounter: Payer: Self-pay | Admitting: Internal Medicine

## 2016-08-05 ENCOUNTER — Encounter (HOSPITAL_COMMUNITY): Payer: Self-pay | Admitting: *Deleted

## 2016-08-05 ENCOUNTER — Emergency Department (HOSPITAL_COMMUNITY)
Admission: EM | Admit: 2016-08-05 | Discharge: 2016-08-05 | Disposition: A | Discharge status: Home / Self Care | Payer: Medicare Other | Attending: Emergency Medicine | Admitting: Emergency Medicine

## 2016-08-05 DIAGNOSIS — Z87891 Personal history of nicotine dependence: Secondary | ICD-10-CM | POA: Insufficient documentation

## 2016-08-05 DIAGNOSIS — Z992 Dependence on renal dialysis: Secondary | ICD-10-CM | POA: Insufficient documentation

## 2016-08-05 DIAGNOSIS — R0602 Shortness of breath: Secondary | ICD-10-CM | POA: Diagnosis present

## 2016-08-05 DIAGNOSIS — Z7901 Long term (current) use of anticoagulants: Secondary | ICD-10-CM | POA: Insufficient documentation

## 2016-08-05 DIAGNOSIS — E1140 Type 2 diabetes mellitus with diabetic neuropathy, unspecified: Secondary | ICD-10-CM | POA: Diagnosis not present

## 2016-08-05 DIAGNOSIS — Z794 Long term (current) use of insulin: Secondary | ICD-10-CM | POA: Insufficient documentation

## 2016-08-05 DIAGNOSIS — N186 End stage renal disease: Secondary | ICD-10-CM | POA: Insufficient documentation

## 2016-08-05 DIAGNOSIS — Z7982 Long term (current) use of aspirin: Secondary | ICD-10-CM | POA: Insufficient documentation

## 2016-08-05 DIAGNOSIS — Z8546 Personal history of malignant neoplasm of prostate: Secondary | ICD-10-CM | POA: Insufficient documentation

## 2016-08-05 DIAGNOSIS — Z96651 Presence of right artificial knee joint: Secondary | ICD-10-CM | POA: Diagnosis not present

## 2016-08-05 DIAGNOSIS — I2510 Atherosclerotic heart disease of native coronary artery without angina pectoris: Secondary | ICD-10-CM | POA: Insufficient documentation

## 2016-08-05 DIAGNOSIS — Z85828 Personal history of other malignant neoplasm of skin: Secondary | ICD-10-CM | POA: Insufficient documentation

## 2016-08-05 DIAGNOSIS — Z85038 Personal history of other malignant neoplasm of large intestine: Secondary | ICD-10-CM | POA: Insufficient documentation

## 2016-08-05 DIAGNOSIS — R0600 Dyspnea, unspecified: Secondary | ICD-10-CM

## 2016-08-05 DIAGNOSIS — I132 Hypertensive heart and chronic kidney disease with heart failure and with stage 5 chronic kidney disease, or end stage renal disease: Secondary | ICD-10-CM | POA: Insufficient documentation

## 2016-08-05 DIAGNOSIS — I5043 Acute on chronic combined systolic (congestive) and diastolic (congestive) heart failure: Secondary | ICD-10-CM | POA: Diagnosis not present

## 2016-08-05 DIAGNOSIS — Z515 Encounter for palliative care: Secondary | ICD-10-CM

## 2016-08-05 DIAGNOSIS — E1122 Type 2 diabetes mellitus with diabetic chronic kidney disease: Secondary | ICD-10-CM | POA: Diagnosis not present

## 2016-08-05 MED ORDER — LORAZEPAM 2 MG/ML IJ SOLN: 0.5000 mg | INTRAMUSCULAR | 0 refills | Status: AC | PRN

## 2016-08-05 MED ORDER — MORPHINE SULFATE 20 MG/5ML PO SOLN: 5.0000 mg | ORAL | 0 refills | Status: AC | PRN

## 2016-08-05 MED ORDER — MORPHINE SULFATE (CONCENTRATE) 10 MG/0.5ML PO SOLN
10.0000 mg | Freq: Once | ORAL | Status: AC
  Administered 2016-08-05: 10 mg via ORAL
  Filled 2016-08-05: qty 5

## 2016-08-05 MED ORDER — LORAZEPAM 2 MG/ML PO CONC
1.0000 mg | Freq: Once | ORAL | Status: AC
  Administered 2016-08-05: 1 mg via ORAL
  Filled 2016-08-05: qty 0.5

## 2016-08-05 NOTE — ED Notes (Signed)
Notified Shearon Stalls, St Jude Rep

## 2016-08-05 NOTE — Progress Notes (Addendum)
[facility] Liaison note  Notified by Dr. Venora Maples and Fuller Mandril, [facility],  of family request for Hospice and [place] services at home after discharge.   Spoke with patient and daughter-in-law, [name] [name], at bedside.  Initiated education related to hospice philosophy, services and team approach to care.  Family and patient verbalized understanding of the information provided.  Per discussion, plan is for discharge to home by [facility] with daughter-in-law today.  Please send signed, completed DNR form home with patient.  Patient will need prescriptions for discharge comfort medications. Please fax d/c instruction list and/or discharge summary to [phone]. Please notify [facility] when patient is ready to leave ER at discharge--call [phone].    DME needs discussed.  This Probation officer has requested fully electric hospital bed, overbed table, respiratory package and bsc.  Spoke with Leah at [facility] at 1134. Explained that this DME will need to be in the home before patient is discharged today.  The home address has been verified and is correct in the chart. [name] [name], daughter-in-law, to be contacted to arrange time of delivery.    [facility] information and contact numbers have been given to [facility] during visit.   Above information shared with Dr. Venora Maples.  Voicemail left for [facility], Fuller Mandril, to inform of the above.    Please call with questions.  Efrain Sella, RN, BSN [phone] [facility] Nurse Liaison

## 2016-08-05 NOTE — ED Notes (Signed)
Pt's daughter-in-law advised she will take pt home d/t pt is becoming restless. Then [facility] arrived - decided to allow them to transport pt.

## 2016-08-05 NOTE — ED Notes (Signed)
Placed patient into a gown and on the monitor waiting for provider 

## 2016-08-05 NOTE — ED Provider Notes (Signed)
[facility] DEPT Provider Note   CSN: [id number] Arrival date & time: 08/05/16  [id number]     History   Chief Complaint Chief Complaint  Patient presents with  . Shortness of Breath    HPI Siah Mews. is a 82 y.o. male.  HPI Patient is an 82 year old gentleman with end-stage renal disease who dialyzes through a right subclavian PermCath.  He's had recent extensive discussions with his nephrology team and he underwent dialysis yesterday but he is tired of dialysis and no longer desires life with dialysis.  He presents emergency department today because of mild shortness of breath.  He underwent 3 hours of dialysis yesterday.  The decision has been made by the patient and the patient's daughter in coordination with her nephrologist Dr. Lorrene Reid to transition to palliative and comfort care only.  He has no other complaints at this time.  He desires medication to assist with his dyspnea and states he feels much better on oxygen.   Past Medical History:  Diagnosis Date  . CARDIOMYOPATHY, PRIMARY, DILATED    a. Mixed ICM/NICM - EF 15% by echo 05/2009; St. Jude CRT-D implanted 06/2009. b. Echo 04/2013 - EF 20%, mild LVH.  [name] Chronic atrial fibrillation ([place])    a. Historically difficult rate control. b. s/p CRT-D implantation with anticipation of AV junction ablation but patient then was able to accomplish rate control and ablation not undertaken.; AVN ablation 08/2013 by Dr Lovena Le  . Chronic systolic CHF (congestive heart failure) ([place])   . Colon cancer ([place])   . CORONARY ARTERY DISEASE    a. BMS to LAD 04/2008. b. Cath 06/2009: nonobstructive disease. c. cath 05/16/2015 DES to prox LAD  . Diabetes mellitus   . Dyspnea    with exertion  . ESRD (end stage renal disease) ([place])    M_W_F hemodialysis  . History of blood transfusion    after colon surgery  . Hyperlipidemia   . Hypertension   . Osteoarthritis   . Pneumonia 2014ish  . Presence of permanent cardiac pacemaker   .  Prostate cancer [facility])    a. s/p radical prostatectomy.  . Pulmonary HTN    a. PA pressure 32mmHg by echo 05/01/13.  [name] Secondary hyperparathyroidism ([place])   . Skin cancer   . SLEEP APNEA    wears CPAP  . Tubular adenoma of colon   . Valvular heart disease    a. Echo 04/2013: mild AI, mild MR.  . Vertigo     Patient Active Problem List   Diagnosis Date Noted  . ESRD (end stage renal disease) ([place]) 07/01/2016  . Acute respiratory distress 12/18/2015  . Acute on chronic systolic and diastolic heart failure, NYHA class 2 ([place]) 12/18/2015  . Positive D dimer 12/18/2015  . Dyspnea on exertion 05/17/2015  . Dyspnea 05/07/2015  . Elevated troponin 05/07/2015  . Diabetic neuropathy ([place]) 08/22/2014  . Diabetes mellitus with renal complications ([place]) [id number]  . Pain in lower limb 02/15/2014  . Encounter for therapeutic drug monitoring 10/20/2013  . Vertigo 04/30/2013  . Onychomycosis 01/26/2013  . Chronic renal insufficiency, stage IV (severe) ([place]) 09/07/2012  . BIVentricular Defibrillator--St Jude 04/20/2011  . Dilated cardiomyopathy ([place]) 08/07/2009  . Osteoarthritis 04/18/2009  . SKIN CANCER, HX OF 04/18/2009  . Sleep apnea 11/01/2008  . Diabetes type 2, uncontrolled ([place]) 07/13/2006  . Hyperlipidemia 07/13/2006  . Essential hypertension 07/13/2006  . CAD (coronary artery disease) 07/13/2006  . Chronic atrial fibrillation ([place]) 07/13/2006  . Chronic systolic CHF (  congestive heart failure) ([place]) 07/13/2006  . COLON CANCER, HX OF 07/13/2006  . PROSTATE CANCER, HX OF 07/13/2006    Past Surgical History:  Procedure Laterality Date  . ABLATION  08/2013   AVN ablation by Dr Lovena Le  . AV FISTULA PLACEMENT  03/30/2012   Procedure: ARTERIOVENOUS (AV) FISTULA CREATION;  Surgeon: Angelia Mould, MD;  Location: [facility];  Service: Vascular;  Laterality: Right;  Creation of BrachioCephalic Fistula Right arm  . AV FISTULA PLACEMENT Left 07/01/2016   Procedure: INSERTION OF  ARTERIOVENOUS (AV) GORE-TEX GRAFT Left THIGH;  Surgeon: Elam Dutch, MD;  Location: [facility];  Service: Vascular;  Laterality: Left;  . AV NODE ABLATION N/A 09/12/2013   Procedure: AV NODE ABLATION;  Surgeon: [name], MD;  Location: [facility] CATH LAB;  Service: Cardiovascular;  Laterality: N/A;  . BI-VENTRICULAR IMPLANTABLE CARDIOVERTER DEFIBRILLATOR  (CRT-D)  2010   STJ CRTD implanted by Dr [name]  . CARDIAC CATHETERIZATION N/A 05/09/2015   Procedure: Left Heart Cath and Coronary Angiography;  Surgeon: Sherren Mocha, MD;  Location: [facility] CV LAB;  Service: Cardiovascular;  Laterality: N/A;  . CARDIAC CATHETERIZATION N/A 05/16/2015   Procedure: Coronary Stent Intervention;  Surgeon: Sherren Mocha, MD;  Location: [facility] CV LAB;  Service: Cardiovascular;  Laterality: N/A;  . COLECTOMY    . EYE SURGERY     left eye prothesis  . FRACTURE SURGERY     collar bone, right knee replacement  . INSERT / REPLACE / REMOVE PACEMAKER    . INSERTION OF DIALYSIS CATHETER N/A 05/20/2016   Procedure: INSERTION OF DIALYSIS CATHETER;  Surgeon: Elam Dutch, MD;  Location: [facility];  Service: Vascular;  Laterality: N/A;  . JOINT REPLACEMENT    . LIGATION OF ARTERIOVENOUS  FISTULA Right 05/20/2016   Procedure: LIGATION OF RIGHT ARTERIOVENOUS  FISTULA;  Surgeon: Elam Dutch, MD;  Location: [facility];  Service: Vascular;  Laterality: Right;  . PACEMAKER INSERTION  2010  . PENILE PROSTHESIS PLACEMENT    . PROSTATECTOMY    . PTCA     stent placed  . removed prosthetic eye    . TOTAL KNEE ARTHROPLASTY  2008   right  . VASCULAR SURGERY  07/01/2016   INSERTION OF ARTERIOVENOUS (AV) GORE-TEX GRAFT THIGH (Left) as a surgical intervention        Home Medications    Prior to Admission medications   Medication Sig Start Date End Date Taking? Authorizing Provider  acetaminophen (TYLENOL) 500 MG tablet Take 1,000 mg by mouth every 4 (four) hours as needed for mild pain.     Historical Provider, MD    aspirin EC 81 MG tablet Take 1 tablet (81 mg total) by mouth daily. 11/09/15   Liliane Shi, PA-C  calcium carbonate (TUMS EX) 750 MG chewable tablet Chew 1 tablet by mouth 3 (three) times daily with meals.    Historical Provider, MD  digoxin (DIGOX) 0.125 MG tablet Take 125 mcg by mouth every other day 05/20/16   Alvia Grove, PA-C  docusate sodium (COLACE) 100 MG capsule Take 300 mg by mouth daily.    Historical Provider, MD  glucose blood (FREESTYLE LITE) test strip USE TO CHECK BLOOD SUGAR TWICE A DAY AND PRN 07/29/16   [name], MD  HUMALOG MIX 75/25 (75-25) 100 UNIT/ML SUSP injection Inject subcutaneously 26  units two times daily with  meals 11/27/15   [name], MD  hydrALAZINE (APRESOLINE) 25 MG tablet Take 25 mg  by mouth See admin instructions. Take 25 mg by mouth twice daily on Sundays,tuesdays,Thursdays, and Saturdays.  Take 25 mg by mouth in the evening on Mondays,Wed,and Fridays    Historical Provider, MD  isosorbide mononitrate (IMDUR) 30 MG 24 hr tablet Take 1 tablet by mouth  daily Patient taking differently: Take 30 mg by mouth daily.  05/20/16   Alvia Grove, PA-C  LORazepam (ATIVAN) 2 MG/ML injection Inject 0.25 mLs (0.5 mg total) into the vein every 4 (four) hours as needed for anxiety. 08/05/16   Jola Schmidt, MD  metoprolol succinate (TOPROL-XL) 25 MG 24 hr tablet Take 0.5 tablets (12.5 mg total) by mouth at bedtime. 07/15/16   Maryann Mikhail, DO  morphine 20 MG/5ML solution Take 1.3 mLs (5.2 mg total) by mouth every 3 (three) hours as needed for pain. 08/05/16   Jola Schmidt, MD  multivitamin (RENA-VIT) TABS tablet Take 1 tablet by mouth every evening.     Historical Provider, MD  nitroGLYCERIN (NITROSTAT) 0.4 MG SL tablet Place 0.4 mg under the tongue every 5 (five) minutes as needed for chest pain.    Historical Provider, MD  warfarin (COUMADIN) 5 MG tablet Take 0.5-1 tablets (2.5-5 mg total) by mouth See admin instructions. Take 5 mg daily except  2.5 mg on Mondays OR as directed by anticoagulation clinic. Patient taking differently: Take 2.5-5 mg by mouth See admin instructions. Take 5 mg by mouth daily except 2.5 mg on Mondays 07/02/16   Alvia Grove, PA-C    Family History Family History  Problem Relation Age of Onset  . Heart disease Father   . Throat cancer Father   . Throat cancer Mother   . Heart disease Mother   . Hypertension Mother     Social History Social History  Substance Use Topics  . Smoking status: Former Smoker    Types: Cigarettes    Quit date: 09/22/1966  . Smokeless tobacco: Never Used  . Alcohol use 0.0 - 0.6 oz/week     Comment: an occasional beer     Allergies   Ace inhibitors and Prednisone   Review of Systems Review of Systems  All other systems reviewed and are negative.    Physical Exam Updated Vital Signs BP (!) 147/44 (BP Location: Left Arm)   Pulse (!) 50   Temp 97.9 F (36.6 C) (Oral)   Resp 16   SpO2 98%   Physical Exam  Constitutional: He is oriented to person, place, and time. He appears well-developed and well-nourished.  HENT:  Head: Normocephalic and atraumatic.  Eyes: EOM are normal.  Neck: Normal range of motion.  Cardiovascular: Normal rate, regular rhythm, normal heart sounds and intact distal pulses.   Pulmonary/Chest: Effort normal and breath sounds normal. No respiratory distress.  Abdominal: Soft. He exhibits no distension. There is no tenderness.  Musculoskeletal: Normal range of motion. He exhibits edema.  Neurological: He is alert and oriented to person, place, and time.  Skin: Skin is warm and dry.  Psychiatric: He has a normal mood and affect. Judgment normal.  Nursing note and vitals reviewed.    ED Treatments / Results  Labs (all labs ordered are listed, but only abnormal results are displayed) Labs Reviewed - No data to display  EKG  EKG Interpretation  Date/Time:  Tuesday August 05 2016 09:34:10 EST Ventricular Rate:  75 PR  Interval:    QRS Duration: 200 QT Interval:  486 QTC Calculation: 543 R Axis:   -89 Text Interpretation:  Ventricular-paced  rhythm No further analysis attempted due to paced rhythm No significant change was found Confirmed by Rande Dario  MD, Lennette Bihari ([id number]) on 08/05/2016 12:37:37 PM       Radiology No results found.  Procedures Procedures (including critical care time)  Medications Ordered in ED Medications  morphine CONCENTRATE 10 MG/0.5ML oral solution 10 mg (10 mg Oral Given 08/05/16 1102)  LORazepam (ATIVAN) 2 MG/ML concentrated solution 1 mg (1 mg Oral Given 08/05/16 1103)     Initial Impression / Assessment and Plan / ED Course  I have reviewed the triage vital signs and the nursing notes.  Pertinent labs & imaging results that were available during my care of the patient were reviewed by me and considered in my medical decision making (see chart for details).  Clinical Course     Patient is transitioned to full palliative and comfort measures at this time.  Oral morphine and Ativan given for dyspnea.  The patient is alert and oriented 3 and has the capacity to make these decisions.  The daughter is in the room and agrees with these decisions.  I have spoken with hospice of [facility] who is aware of the recent referral given to them by the nephrology team.  [facility] will work on home palliative care including home oxygen.  He will transition to home later today.  He's been given prescriptions for oral morphine and oral Ativan for comfort reasons  Final Clinical Impressions(s) / ED Diagnoses   Final diagnoses:  ESRD (end stage renal disease) ([place])  Dyspnea, unspecified type  Encounter for hospice care    New Prescriptions New Prescriptions   LORAZEPAM (ATIVAN) 2 MG/ML INJECTION    Inject 0.25 mLs (0.5 mg total) into the vein every 4 (four) hours as needed for anxiety.   MORPHINE 20 MG/5ML SOLUTION    Take 1.3 mLs (5.2 mg total) by mouth every 3 (three) hours  as needed for pain.     Jola Schmidt, MD 08/05/16 [phone]

## 2016-08-05 NOTE — ED Notes (Addendum)
Daughter advised home health advised should have hospital bed, home O2, etc ready for pt between 1500-1700 this afternoon. Daughter states pt is finally able to rest.

## 2016-08-05 NOTE — ED Triage Notes (Signed)
Pt arrives with c/o SOB that has been increasing over the past 2 days. Pt states he has not been eating or taking his insulin. Family reports to [facility] that the pt has been increasing depressed rt loss of his wife 3 years ago and has been not tolerating his dialysis well. Family also reports to [facility] the pt states he is ready to go and they need to get the family together.

## 2016-08-05 NOTE — ED Notes (Addendum)
Daughter and RN assisted pt w/changing from gown into personal shirt. Pt sitting in recliner. States is comfortable. Aware waiting for PTAR d/t daughter advised medical equipment has arrived at his house.

## 2016-08-07 ENCOUNTER — Telehealth: Payer: Self-pay | Admitting: Internal Medicine

## 2016-08-07 NOTE — Telephone Encounter (Signed)
°  FYI Opal Sidles from Hospice called to say pt wanted a DNR and they took one to the home and the Hospice doctor signed off on it since the pt wanted one in the home

## 2016-08-14 LAB — CUP PACEART REMOTE DEVICE CHECK
Battery Remaining Percentage: 9 %
Battery Voltage: 2.66 V
Date Time Interrogation Session: 20171026080012
HIGH POWER IMPEDANCE MEASURED VALUE: 40 Ohm
Implantable Lead Implant Date: 20101028
Implantable Lead Location: 753858
Implantable Lead Model: 4196
Lead Channel Pacing Threshold Amplitude: 0.75 V
Lead Channel Pacing Threshold Pulse Width: 1 ms
Lead Channel Sensing Intrinsic Amplitude: 11.5 mV
Lead Channel Setting Pacing Amplitude: 2.5 V
Lead Channel Setting Pacing Pulse Width: 1 ms
Lead Channel Setting Sensing Sensitivity: 0.5 mV
MDC IDC LEAD IMPLANT DT: 20101028
MDC IDC LEAD LOCATION: 753860
MDC IDC LEAD MODEL: 7121
MDC IDC MSMT BATTERY REMAINING LONGEVITY: 6 mo
MDC IDC MSMT LEADCHNL LV IMPEDANCE VALUE: 360 Ohm
MDC IDC MSMT LEADCHNL LV PACING THRESHOLD AMPLITUDE: 1 V
MDC IDC MSMT LEADCHNL RV IMPEDANCE VALUE: 380 Ohm
MDC IDC MSMT LEADCHNL RV PACING THRESHOLD PULSEWIDTH: 0.6 ms
MDC IDC PG IMPLANT DT: 20101028
MDC IDC SET LEADCHNL LV PACING AMPLITUDE: 2.5 V
MDC IDC SET LEADCHNL RV PACING PULSEWIDTH: 0.6 ms
Pulse Gen Serial Number: 595859

## 2016-08-22 ENCOUNTER — Other Ambulatory Visit: Payer: Self-pay | Admitting: Internal Medicine

## 2016-08-22 DEATH — deceased

## 2016-09-04 ENCOUNTER — Encounter: Payer: Medicare Other | Admitting: Internal Medicine

## 2016-09-09 ENCOUNTER — Ambulatory Visit: Payer: Medicare Other | Admitting: Podiatry

## 2016-10-25 IMAGING — DX DG CHEST 2V
2 series · 2 of 2 positions shown · non-contrast
Comparison: Chest radiograph from 06/04/2015

CLINICAL DATA: Acute onset of shortness of breath. Initial
encounter.

EXAM:
CHEST  2 VIEW

[chest pa]
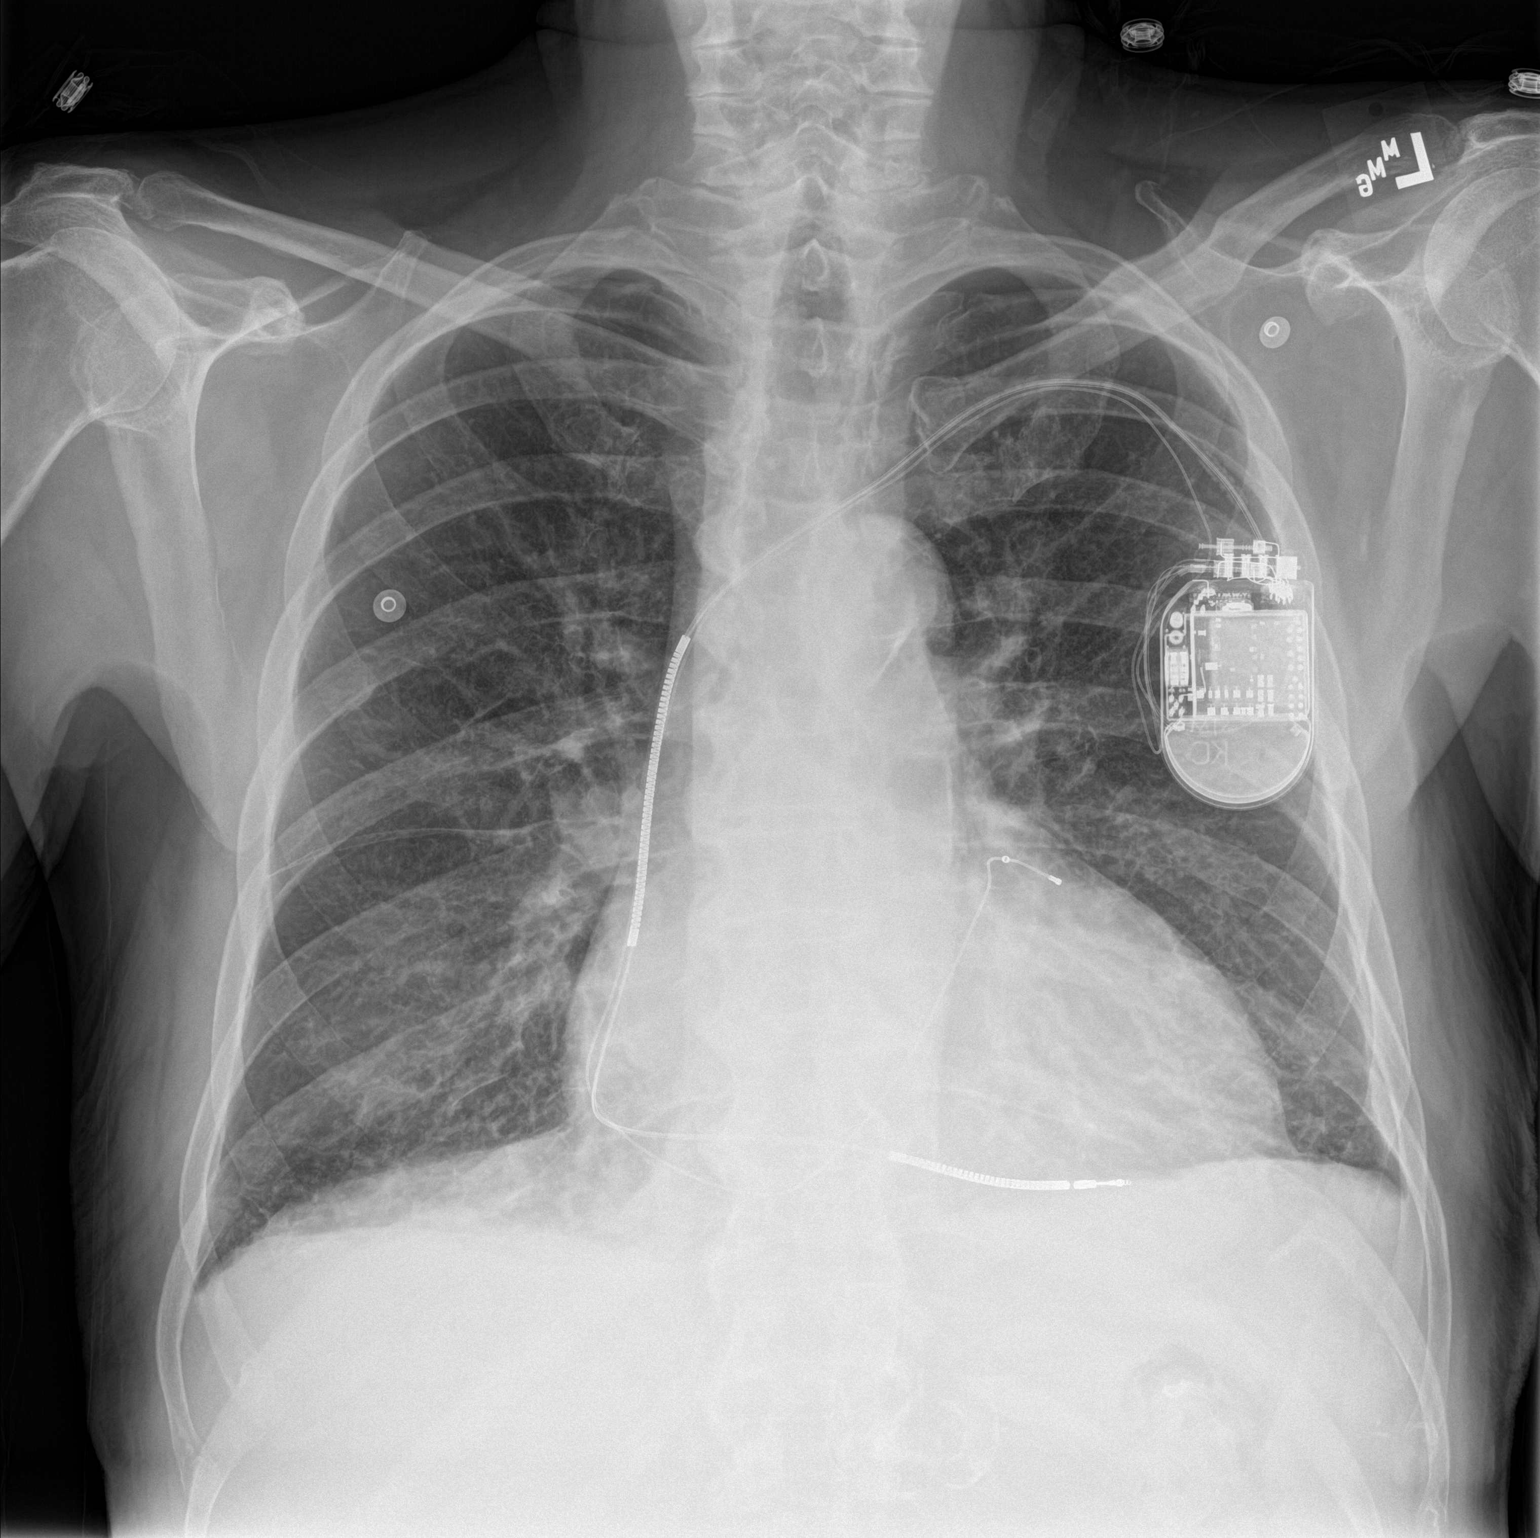

[chest lat]
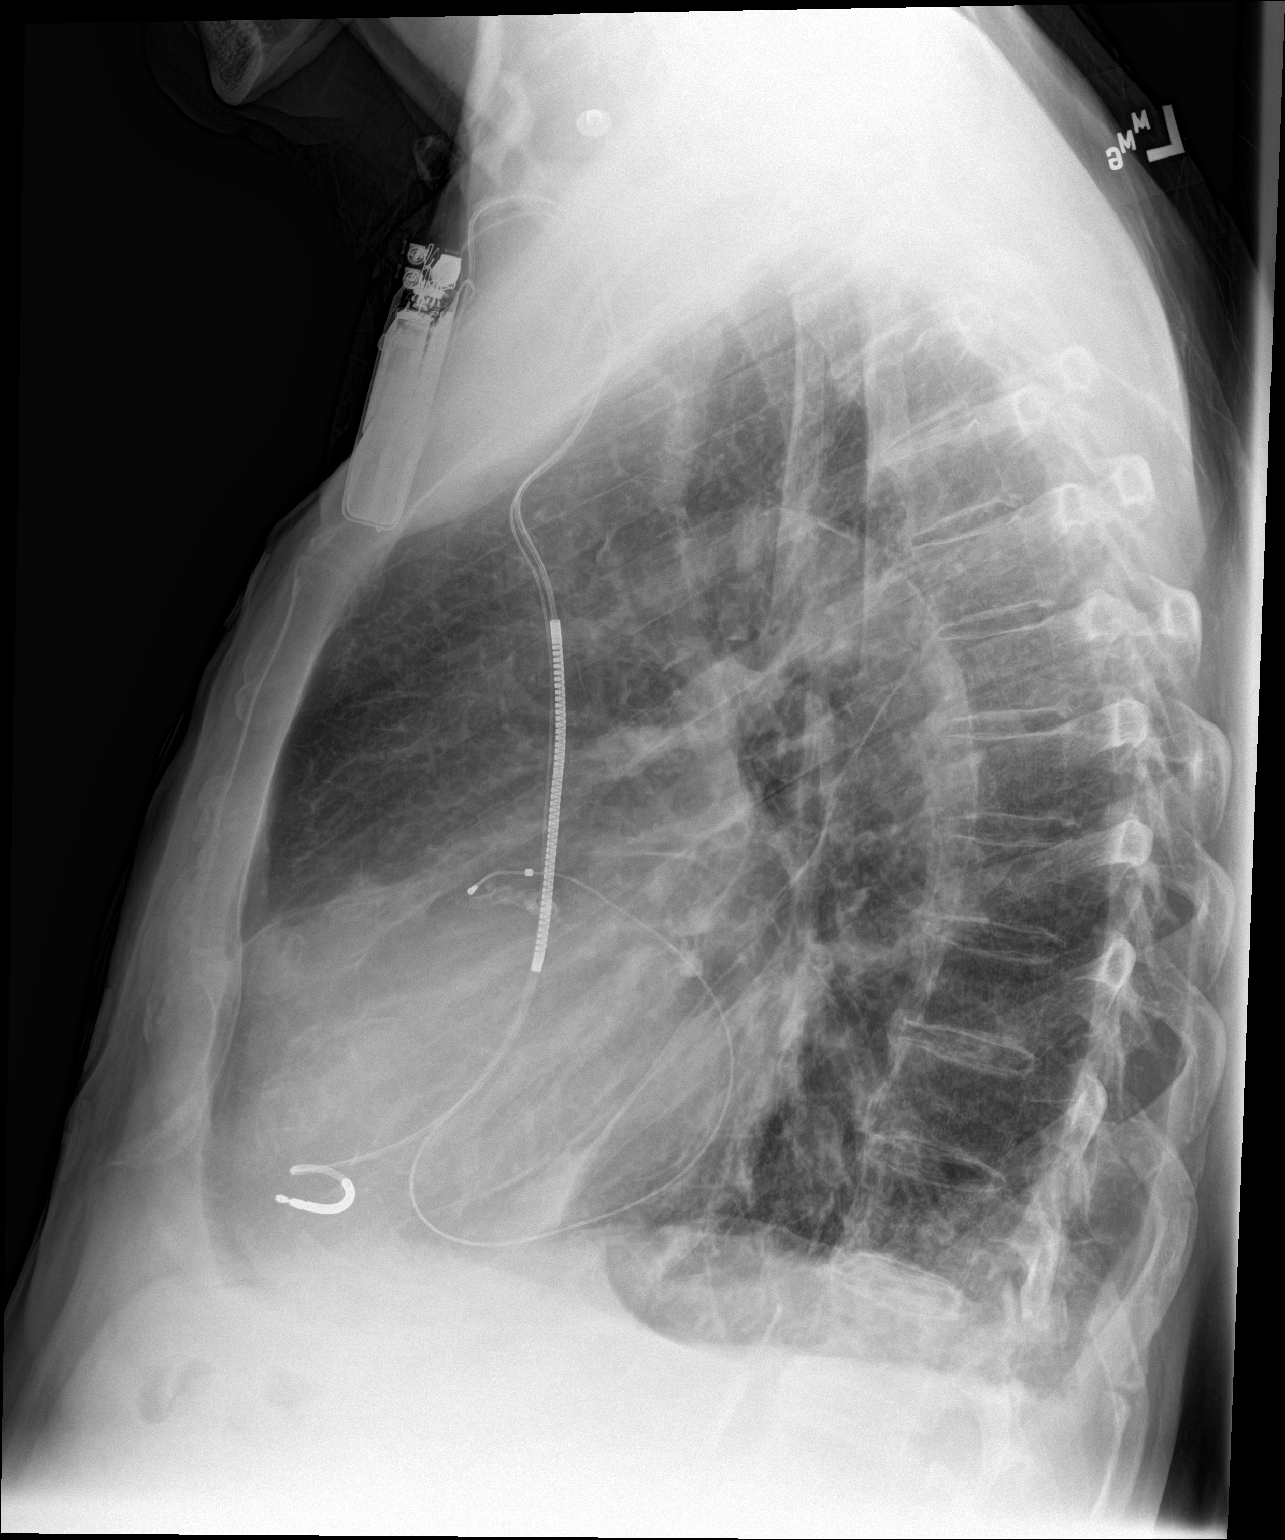

[2 of 2 positions shown; findings below may reference images not displayed]

FINDINGS: The lungs are well-aerated. Mild bibasilar airspace opacities may
reflect atelectasis or possibly mild pneumonia. Mild peribronchial
thickening is noted. No pleural effusion or pneumothorax is seen.

The heart is borderline enlarged. A pacemaker/AICD is noted
overlying the left chest wall, with leads ending overlying the right
ventricle and coronary sinus. No acute osseous abnormalities are
seen.
IMPRESSION: Mild bibasilar airspace opacities may reflect atelectasis or
possibly mild pneumonia. Mild peribronchial thickening noted.
Borderline cardiomegaly.

## 2017-05-21 IMAGING — CR DG CHEST 2V
2 series · 2 of 2 positions shown · non-contrast
Comparison: 07/06/2016

CLINICAL DATA: Cough x2 weeks

EXAM:
CHEST  2 VIEW

[chest lat]
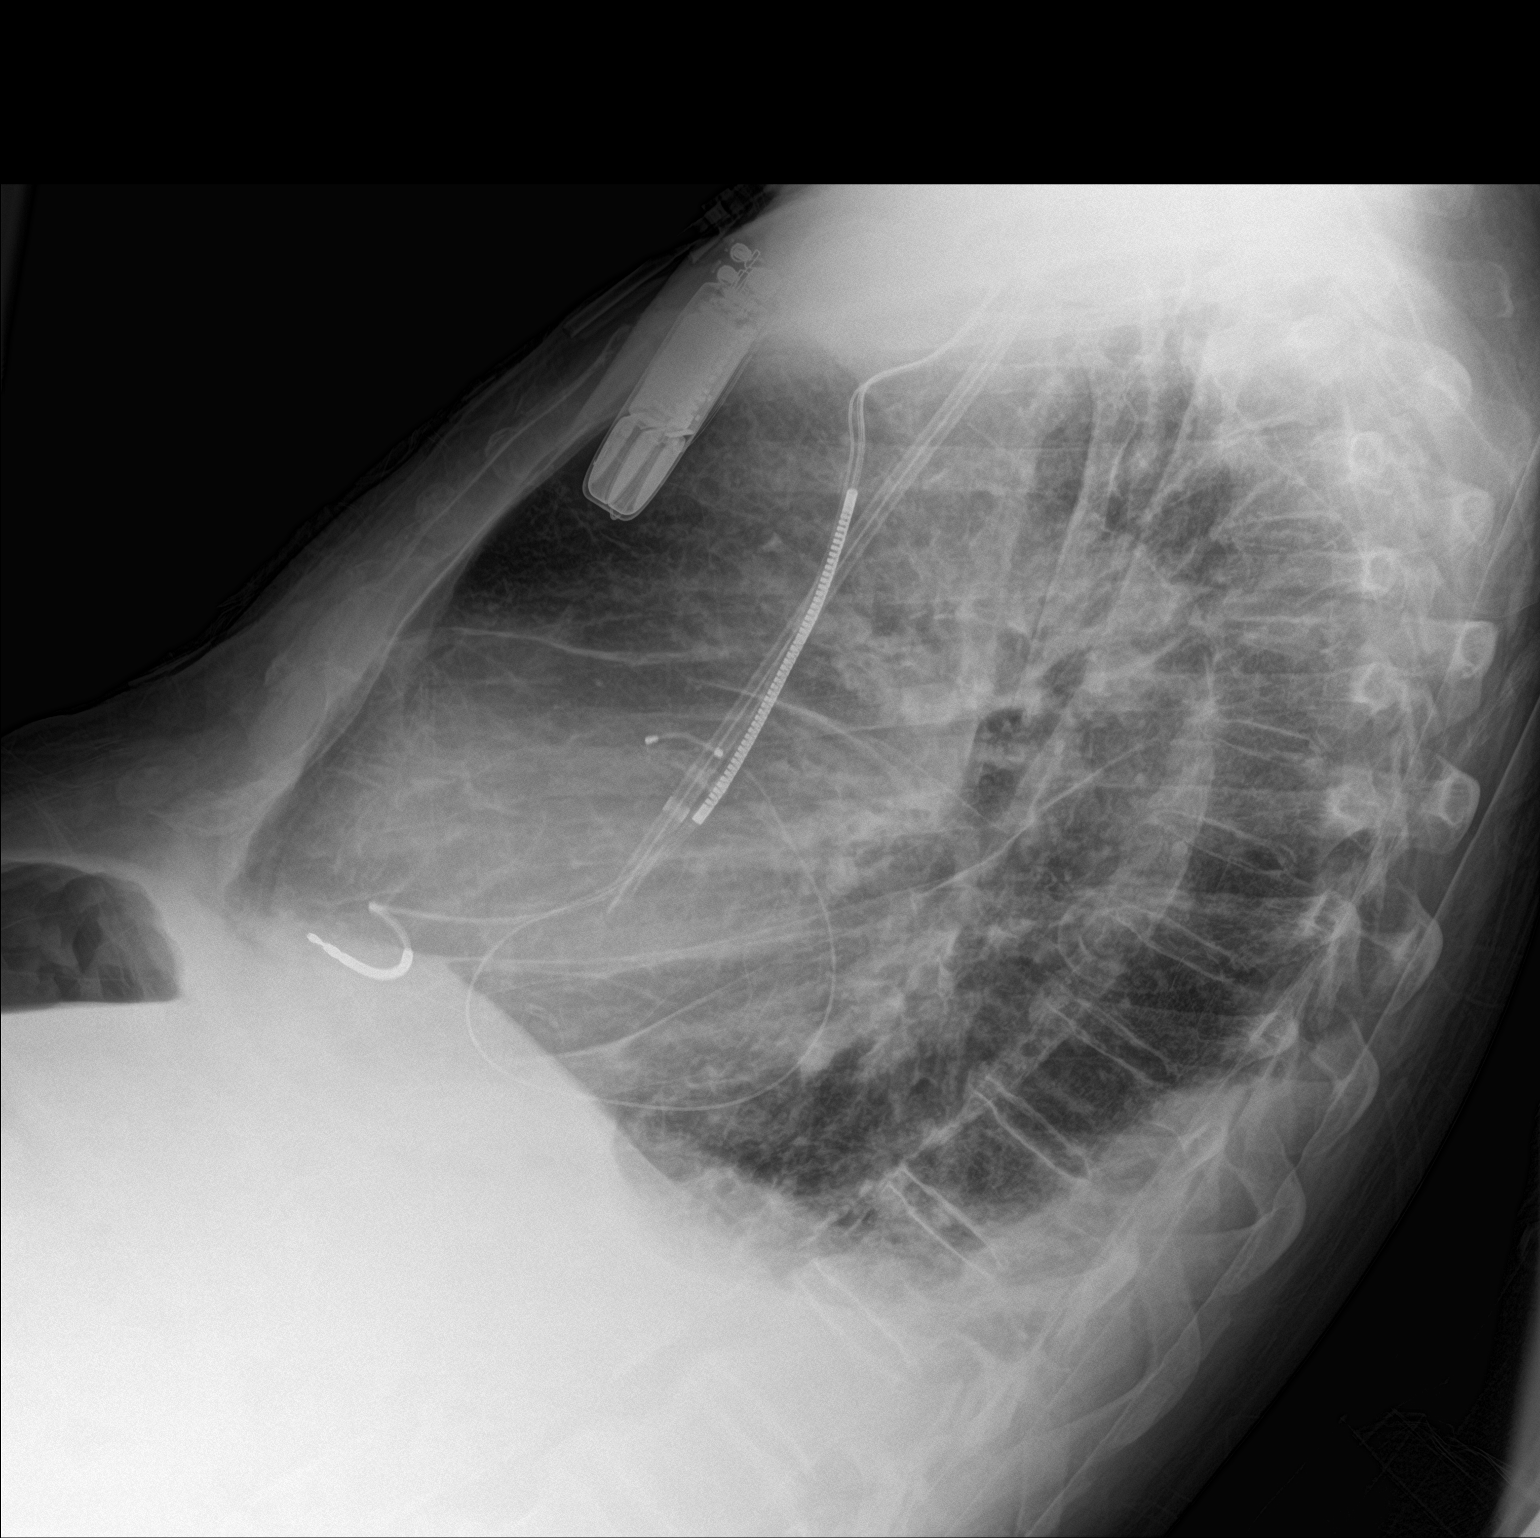

[chest ap]
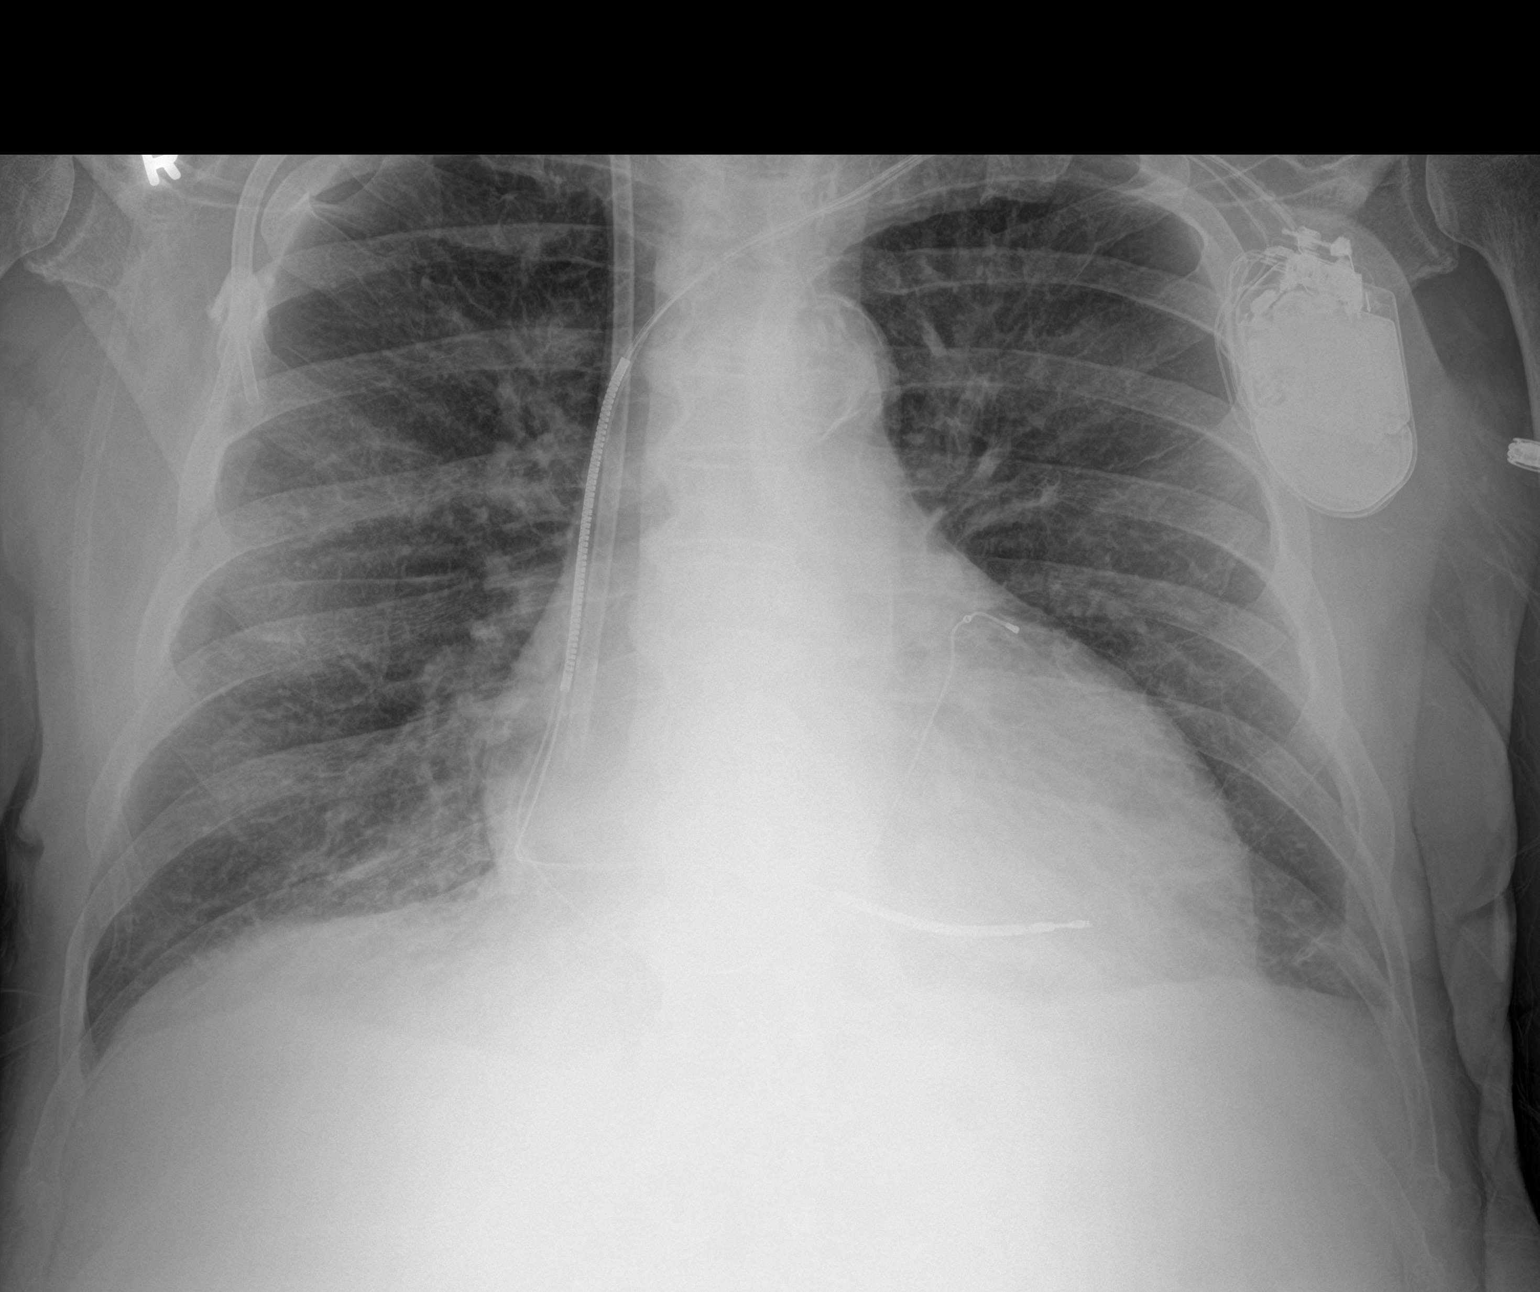

[2 of 2 positions shown; findings below may reference images not displayed]

FINDINGS: AP and lateral views of the chest demonstrate right-sided central
venous catheter tip overlying the right atrium. A left-sided ICD
device is similar in position. There are small bilateral effusions,
increased compared to prior. Mild hazy bibasilar atelectasis or
infiltrates. Slight interval enlargement of the cardiomediastinal
silhouette with mild central congestion. Atherosclerosis of the
aorta. No pneumothorax.
IMPRESSION: 1. Cardiomegaly with mild central vascular congestion.
2. Development of small bilateral pleural effusions. Hazy
atelectasis, infiltrate or edema within the lung bases.
3. Atherosclerotic vascular disease of the aorta.
